# Patient Record
Sex: Female | Born: 1950
Health system: Southern US, Community
[De-identification: ages and names within clinical notes are randomized; demographics above are authoritative.]

## PROBLEM LIST (undated history)

## (undated) DIAGNOSIS — M21941 Unspecified acquired deformity of hand, right hand: Secondary | ICD-10-CM

## (undated) DIAGNOSIS — F1010 Alcohol abuse, uncomplicated: Secondary | ICD-10-CM

## (undated) DIAGNOSIS — C189 Malignant neoplasm of colon, unspecified: Secondary | ICD-10-CM

## (undated) DIAGNOSIS — Z95828 Presence of other vascular implants and grafts: Secondary | ICD-10-CM

## (undated) DIAGNOSIS — M21942 Unspecified acquired deformity of hand, left hand: Secondary | ICD-10-CM

## (undated) MED FILL — Dexamethasone Sodium Phosphate Inj 100 MG/10ML: INTRAMUSCULAR | Qty: 1 | Status: AC

---

## 2014-12-30 DIAGNOSIS — C189 Malignant neoplasm of colon, unspecified: Secondary | ICD-10-CM

## 2014-12-30 HISTORY — DX: Malignant neoplasm of colon, unspecified: C18.9

## 2014-12-30 HISTORY — PX: ABDOMINAL SURGERY: SHX537

## 2017-04-22 ENCOUNTER — Encounter (HOSPITAL_COMMUNITY): Payer: Self-pay | Admitting: Emergency Medicine

## 2017-04-22 ENCOUNTER — Emergency Department (HOSPITAL_COMMUNITY)
Admission: EM | Admit: 2017-04-22 | Discharge: 2017-04-23 | Disposition: A | Discharge status: Home / Self Care | Payer: Self-pay | Attending: Emergency Medicine | Admitting: Emergency Medicine

## 2017-04-22 ENCOUNTER — Emergency Department (HOSPITAL_COMMUNITY): Payer: Self-pay

## 2017-04-22 DIAGNOSIS — R222 Localized swelling, mass and lump, trunk: Secondary | ICD-10-CM | POA: Insufficient documentation

## 2017-04-22 DIAGNOSIS — R1900 Intra-abdominal and pelvic swelling, mass and lump, unspecified site: Secondary | ICD-10-CM

## 2017-04-22 DIAGNOSIS — R109 Unspecified abdominal pain: Secondary | ICD-10-CM | POA: Insufficient documentation

## 2017-04-22 HISTORY — DX: Alcohol abuse, uncomplicated: F10.10

## 2017-04-22 LAB — COMPREHENSIVE METABOLIC PANEL
ALBUMIN: 4.3 g/dL (ref 3.5–5.0)
ALT: 36 U/L (ref 14–54)
AST: 38 U/L (ref 15–41)
Alkaline Phosphatase: 75 U/L (ref 38–126)
Anion gap: 11 (ref 5–15)
BUN: 9 mg/dL (ref 6–20)
CALCIUM: 9 mg/dL (ref 8.9–10.3)
CO2: 22 mmol/L (ref 22–32)
Chloride: 104 mmol/L (ref 101–111)
Creatinine, Ser: 0.53 mg/dL (ref 0.44–1.00)
GFR calc non Af Amer: >60 mL/min (ref >60)
GLUCOSE: 88 mg/dL (ref 65–99)
POTASSIUM: 4.1 mmol/L (ref 3.5–5.1)
SODIUM: 137 mmol/L (ref 135–145)
Total Bilirubin: 0.4 mg/dL (ref 0.3–1.2)
Total Protein: 7.7 g/dL (ref 6.5–8.1)

## 2017-04-22 LAB — URINALYSIS, ROUTINE W REFLEX MICROSCOPIC
Bilirubin Urine: NEGATIVE
Glucose, UA: NEGATIVE mg/dL
HGB URINE DIPSTICK: NEGATIVE
Ketones, ur: NEGATIVE mg/dL
Leukocytes, UA: NEGATIVE
Nitrite: NEGATIVE
Protein, ur: NEGATIVE mg/dL
pH: 5 (ref 5.0–8.0)

## 2017-04-22 LAB — CBC
HCT: 43.7 % (ref 36.0–46.0)
HEMOGLOBIN: 15.1 g/dL — AB (ref 12.0–15.0)
MCH: 32 pg (ref 26.0–34.0)
MCHC: 34.6 g/dL (ref 30.0–36.0)
MCV: 92.6 fL (ref 78.0–100.0)
Platelets: 265 10*3/uL (ref 150–400)
RBC: 4.72 MIL/uL (ref 3.87–5.11)
RDW: 13.5 % (ref 11.5–15.5)
WBC: 9.2 10*3/uL (ref 4.0–10.5)

## 2017-04-22 LAB — LIPASE, BLOOD: LIPASE: 45 U/L (ref 11–51)

## 2017-04-22 MED ORDER — HYDROCODONE-ACETAMINOPHEN 5-325 MG PO TABS: 1.0000 | ORAL_TABLET | Freq: Four times a day (QID) | ORAL | 0 refills | Status: DC | PRN

## 2017-04-22 MED ORDER — IOPAMIDOL (ISOVUE-300) INJECTION 61%
100.0000 mL | Freq: Once | INTRAVENOUS | Status: AC | PRN
  Administered 2017-04-22: 100 mL via INTRAVENOUS

## 2017-04-22 NOTE — ED Notes (Signed)
Denies N/V/D/Fever.  Able to pass gas and have normal bowel movements. Complaining of pain on mass and burning sensations in lower abdomen.

## 2017-04-22 NOTE — ED Triage Notes (Signed)
Pt reports she had a abd mass that was cancerous removed in 2016. Pt sates 6 months ago she started having some pain and swelling around the lower incision. Pt has a large protrusion in her lower abd that is hard to touch and warm. p states it has been there for 6 months. Pt reports drinking 5 shots of vodka every day.

## 2017-04-22 NOTE — Discharge Instructions (Signed)
Please follow-up with the cancer Center. Return if any nausea, vomiting, unable to have a bowel movement, any new concerning symptoms.

## 2017-04-22 NOTE — ED Provider Notes (Signed)
[facility] DEPT Provider Note   CSN: [id number] Arrival date & time: 04/22/17  1614     History   Chief Complaint Chief Complaint  Patient presents with  . Mass    HPI Yolanda Watson is a 66 y.o. female.  HPI Yolanda Watson is a 66 y.o. female with hx of abdominal mass and alcohol abuse, presents to ED with complaint of abdominal mass. Pt states she had intra abdominal mass 2 years ago, operated at [facility]. States she had mass resection, colostomy, and few months later colostomy take down. States she did not require radiation or chemotherapy. States that she was doing well up until one year ago when she started noticing fullness in the same abdominal area. She states this fullness has grown into a protruding mass. She reports in the last several weeks it has become more tender. She denies any trouble eating, denies any recent weight loss, she admits to no trouble having a bowel movement or urinating. She has been taking Advil for her pain which she states helps. She currently lives in this area and does not have a primary care doctor or insurance.  Medical records obtained from [facility]. Patient presented initially to the emergency department with colon perforation from what was diagnosed later malignant neoplasm of descending colon.   Past Medical History:  Diagnosis Date  . Alcohol abuse   . Cancer ([place])     There are no active problems to display for this patient.   Past Surgical History:  Procedure Laterality Date  . ABDOMINAL SURGERY      OB History    No data available       Home Medications    Prior to Admission medications   Not on File    Family History No family history on file.  Social History Social History  Substance Use Topics  . Smoking status: Not on file  . Smokeless tobacco: Not on file  . Alcohol use Not on file     Allergies   Penicillins   Review of Systems Review of Systems  Constitutional: Negative for chills and fever.    Respiratory: Negative for cough, chest tightness and shortness of breath.   Cardiovascular: Negative for chest pain, palpitations and leg swelling.  Gastrointestinal: Positive for abdominal pain. Negative for diarrhea, nausea and vomiting.  Genitourinary: Negative for dysuria, flank pain and pelvic pain.  Musculoskeletal: Negative for arthralgias, myalgias, neck pain and neck stiffness.  Skin: Negative for rash.  Neurological: Negative for dizziness, weakness and headaches.  All other systems reviewed and are negative.    Physical Exam Updated Vital Signs BP 134/73 (BP Location: Left Arm)   Pulse 97   Temp 98 F (36.7 C) (Oral)   Resp 16   SpO2 97%   Physical Exam  Constitutional: She appears well-developed and well-nourished. No distress.  HENT:  Head: Normocephalic.  Eyes: Conjunctivae are normal.  Neck: Neck supple.  Cardiovascular: Normal rate, regular rhythm and normal heart sounds.   Pulmonary/Chest: Effort normal and breath sounds normal. No respiratory distress. She has no wheezes. She has no rales.  Abdominal: Soft. Bowel sounds are normal. She exhibits no distension. There is tenderness. There is no rebound.  Large palpable mass over midline lower abdomen from umbilicus to the pubic bone. There is mild erythema of the skin surrounding the tip of the mass, otherwise normal coloration of the skin. Diffuse tenderness. Abdomen is soft otherwise with no guarding.  Musculoskeletal: She exhibits no edema.  Neurological:  She is alert.  Skin: Skin is warm and dry.  Psychiatric: She has a normal mood and affect. Her behavior is normal.  Nursing note and vitals reviewed.    ED Treatments / Results  Labs (all labs ordered are listed, but only abnormal results are displayed) Labs Reviewed  CBC - Abnormal; Notable for the following:       Result Value   Hemoglobin 15.1 (*)    All other components within normal limits  URINALYSIS, ROUTINE W REFLEX MICROSCOPIC - Abnormal;  Notable for the following:    Specific Gravity, Urine >1.046 (*)    All other components within normal limits  LIPASE, BLOOD  COMPREHENSIVE METABOLIC PANEL    EKG  EKG Interpretation None       Radiology Ct Abdomen Pelvis W Contrast  Result Date: 04/22/2017 CLINICAL DATA:  66 year old female with abdominal mass and pain for 6 months. EXAM: CT ABDOMEN AND PELVIS WITH CONTRAST TECHNIQUE: Multidetector CT imaging of the abdomen and pelvis was performed using the standard protocol following bolus administration of intravenous contrast. CONTRAST:  183mL ISOVUE-300 IOPAMIDOL (ISOVUE-300) INJECTION 61% COMPARISON:  None. FINDINGS: Lower chest: The visualized lung bases are clear. No intra-abdominal free air or free fluid. Hepatobiliary: Small scattered calcified hepatic granuloma. The liver is otherwise unremarkable. No intrahepatic biliary ductal dilatation. The gallbladder is unremarkable as well. Pancreas: Unremarkable. No pancreatic ductal dilatation or surrounding inflammatory changes. Spleen: Small scattered calcified splenic granuloma. Adrenals/Urinary Tract: The adrenal glands, kidneys, visualized ureters, appear unremarkable. Subcentimeter right renal upper pole hypodense lesion is too small to characterize but likely represents a cyst. The urinary bladder is predominantly collapsed. There is slight irregularity of the bladder wall which may be related to underdistention or chronic infection. The anterior dome of the bladder connects to the anterior pelvic wall mass via the the urachal remnant. Extension of the mass along the urachal remnant with invasion of the bladder wall is less likely but not entirely excluded. Stomach/Bowel: There is no evidence of bowel obstruction or active inflammation. The appendix is not visualized with certainty. No inflammatory changes identified in the right lower quadrant. There is focal abutment of a loop of distal small bowel to the posterior anterior pelvic wall  masses. Vascular/Lymphatic: There is moderate aortoiliac atherosclerotic disease. The origins of the celiac axis, SMA, IMA and the renal arteries are patent. No portal venous gas identified. Top-normal retroperitoneal lymph node in the cavoatrial region measure up to 9 mm in short axis. Top-normal lymph node along the gastrohepatic ligament measures up to 8 mm in short axis. Somewhat rounded bilateral iliac chain and pelvic sidewall lymph nodes measure up to 8 mm in diameter. Reproductive: The uterus is retroverted or retroflexed. There is a 3.0 x 3.7 cm fat containing lesion in the region of the left adnexa most consistent with a teratoma. Other: There is an 11 x 12 cm heterogeneous multilobulated mass with areas of lower attenuation in the anterior pelvic wall and involving the rectus abdominus musculature. A smaller mass noted in the right inguinal region measuring approximately 2 x 2 cm. This mass is most concerning for a neoplastic process and may represent a metastatic disease or a primary neoplasm such as a sarcoma or lymphoma. Other etiologies are not excluded. There is midline vertical anterior abdominal wall incisional scar. Musculoskeletal: Osteopenia with degenerative changes of the spine. Multilevel old-appearing compression deformity primarily involving L2 and L3. No acute fracture. IMPRESSION: 1. Large heterogeneous anterior pelvic wall mass most consistent with malignancy and  may represent metastatic disease versus primary neoplasm such as sarcoma or lymphoma. A smaller lesion is also seen in the right anterior inguinal area. 2. Somewhat rounded bilateral iliac chain lymph node measuring up to 8 mm. A top-normal retroperitoneal nodes measuring approximately 9 mm in short axis is seen in the cavoatrial region. 3. No evidence of metastatic disease within the liver on this CT. 4. Left adnexal dermoid. 5. Mild irregularity and thickening of the bladder wall may be related to underdistention or chronic  infection versus chronic bladder outlet obstruction. Infiltrative involvement of the bladder by the pelvic wall mass is less likely but not entirely excluded. 6. No evidence of bowel obstruction or active inflammation. There is focal abutment of a distal small bowel loops to the posterior aspect of the anterior pelvic mass. Electronically Signed   By: Anner Crete M.D.   On: 04/22/2017 21:53    Procedures Procedures (including critical care time)  Medications Ordered in ED Medications - No data to display   Initial Impression / Assessment and Plan / ED Course  I have reviewed the triage vital signs and the nursing notes.  Pertinent labs & imaging results that were available during my care of the patient were reviewed by me and considered in my medical decision making (see chart for details).     Pt in ED with new abdominal mass, states had mass removed from same area in 2016, had colostomy and take down, unsure what cancer or mass it was and has had no follow up. On exam, large mass over abdomen. No problems with eating or bowels, but mass is painful. Will get labs and CT abd/pelvis.    11:23 PM CT showing large mass as described above. I received records from [facility]. It appears the patient had malignant colon cancer and ileostomy was performed initially due to colon perforation which was caused by the mass. She had ileostomy closure on 06/12/15. Patient appears to be very clueless of her prior diagnosis and has not had any follow-up. I am worried that this is a recurrent colon cancer. At this time she does not qualify for admission and does not need a surgical consultation given no associated symptoms. I discussed with Mariann Laster with case management and she will get patient into cancer Center clinic for further follow-up and further management. I will prescribe patient pain medications to help her with her pain. Return precautions discussed. Patient was informed of diagnosis and further  management plan and all questions answered.  Vitals:   04/22/17 2045 04/22/17 2100 04/22/17 2230 04/22/17 2245  BP: 134/65 111/68 122/79 128/78  Pulse: 84 74 75 77  Resp:      Temp:      TempSrc:      SpO2: 99% 95% 96% 97%       Final Clinical Impressions(s) / ED Diagnoses   Final diagnoses:  Abdominal mass, unspecified abdominal location    New Prescriptions New Prescriptions   HYDROCODONE-ACETAMINOPHEN (NORCO) 5-325 MG TABLET    Take 1 tablet by mouth every 6 (six) hours as needed for moderate pain.     Jeannett Senior, PA-C 04/22/17 [place], MD 04/23/17 [phone]

## 2017-04-23 ENCOUNTER — Telehealth: Payer: Self-pay | Admitting: Surgery

## 2017-04-23 ENCOUNTER — Telehealth: Payer: Self-pay | Admitting: Oncology

## 2017-04-23 ENCOUNTER — Telehealth: Payer: Self-pay | Admitting: *Deleted

## 2017-04-23 NOTE — Telephone Encounter (Signed)
ED CM spoke with patient last night regarding EDP request for  referral to [facility]. CM contacted the [facility] [phone] concerning patient  establishing care.  CM contacted patient today and provided the information to contact the [facility] today, to schedule appointment f/u. Patient verbalized understanding teach back done. CM also provided information to establish primary care at the [facility].

## 2017-04-23 NOTE — Telephone Encounter (Signed)
Appt has been scheduled for the pt to see Dr. Benay Spice on 4/26 at 1130am. Pt aware to arrive 30 minutes early. Voiced understanding.

## 2017-04-23 NOTE — ED Notes (Signed)
Pt departed in NAD.  

## 2017-04-23 NOTE — Telephone Encounter (Signed)
"  I am trying to reach Yolanda Watson about a place to go for my cancer care center.  Please call me at [phone].  I do not feel good, have pain and need hopefully I'm calling the right number."  Returned call.  "I was seen in the ED yesterday.  I have a mass in my abdomen and need an appointment."    Message left with new patient coordinator.

## 2017-04-24 ENCOUNTER — Telehealth: Payer: Self-pay

## 2017-04-24 ENCOUNTER — Encounter: Payer: Self-pay | Admitting: Oncology

## 2017-04-24 ENCOUNTER — Ambulatory Visit (HOSPITAL_BASED_OUTPATIENT_CLINIC_OR_DEPARTMENT_OTHER): Payer: Self-pay | Admitting: Oncology

## 2017-04-24 ENCOUNTER — Telehealth: Payer: Self-pay | Admitting: Oncology

## 2017-04-24 VITALS — BP 155/76 | HR 96 | Temp 98.2°F | Resp 18 | Ht 66.0 in | Wt 120.2 lb

## 2017-04-24 DIAGNOSIS — Z720 Tobacco use: Secondary | ICD-10-CM

## 2017-04-24 DIAGNOSIS — C189 Malignant neoplasm of colon, unspecified: Secondary | ICD-10-CM

## 2017-04-24 DIAGNOSIS — Z85038 Personal history of other malignant neoplasm of large intestine: Secondary | ICD-10-CM

## 2017-04-24 DIAGNOSIS — R935 Abnormal findings on diagnostic imaging of other abdominal regions, including retroperitoneum: Secondary | ICD-10-CM

## 2017-04-24 NOTE — Progress Notes (Signed)
Met with uninsured patient and spouse regarding coverage. Patient states her spouse has Medicare and she does not due to not being 43 yet. Asked patient if they have applied for Medicaid. She states they have not because they do not qualify. Advised patient she will automatically receive a 55% discount for being uninsured. Also advised that she may apply for an additional discount by applying for the [facility] FAA but she must have applied for Medicaid within this year. Gave patient a Medicaid application as well as a [place] FAA. Patient made aware that she can bring the applications/supporting documents back to me to submit via fax/mail at her next visit or submit them to the requested locations. Patient states she will bring them back to me at her next visit on 05/05/17. Advised patient that once her treatment plan has been established, there may be assistance for the drugs that she will be taking for treatment, which Rob will reach out to her at that time to discuss. Patient has my card for any additonal financial questions or concerns.

## 2017-04-24 NOTE — Progress Notes (Signed)
[facility] New Patient Consult   Referring MD: Fatima Blank, [address] [place], [place] [address]   Yolanda Watson 66 y.o.  03/15/1951    Reason for Referral: Abdominal mass   HPI: Ms. Knisley reports a palpable mass in the low abdomen for the past 4 months. There has been progressive associated pain. She takes Aleve twice daily for the pain. She presented to the [facility] emergency room on 04/22/2017. A CT of the abdomen and pelvis revealed a large heterogenous anterior pelvic wall mass with involvement of the rectus abdominous muscle. Smaller mass measuring 2 x 2 centimeters is noted in the right inguinal area. Top normal sized retroperitoneal and gastric hepatic ligament nodes were noted. There also bilateral iliac and pelvic sidewall nodes measuring up to 8 mm. A loop of distal small bowel abuts the posterior aspect of the pelvic mass, the mass extends to the dome of the bladder.  She reports being diagnosed with colon cancer in 2016. She underwent a colectomy and temporary colostomy at [facility] in [place]. She did not receive adjuvant therapy and has not undergone a follow-up colonoscopy. She has relocated to [place].  Past Medical History:  Diagnosis Date  . Alcohol abuse   . Cancer ([place])     .  G1 P1   .  Chronic flexion contractures of the fingers following remote trauma to the wrist area bilaterally  Past Surgical History:  Procedure Laterality Date  . ABDOMINAL SURGERY      .  Colectomy/colostomy   .  Colostomy reversal   .  Bilateral wrist surgery following trauma  Medications: Reviewed  Allergies:  Allergies  Allergen Reactions  . Penicillins     Family history: No family history of cancer  Social History:   She is retired and lives in [place]. She worked as an Glass blower/designer. She smokes cigarettes, currently 10 per day. She has 2 liquor drinks per day. No transfusion history. No risk factor for HIV or  hepatitis.     ROS:   Positives include: Low abdominal mass with associated pain, chronic flexion contractures of the fingers  A complete ROS was otherwise negative.  Physical Exam:  Blood pressure (!) 155/76, pulse 96, temperature 98.2 F (36.8 C), temperature source Oral, resp. rate 18, height 5\' 6"  (1.676 m), weight 120 lb 3.2 oz (54.5 kg), SpO2 100 %.  HEENT: Oropharynx without visible mass, neck without mass Lungs: Clear bilaterally, no respiratory distress Cardiac: Regular rate and rhythm Abdomen: No hepatosplenomegaly, no apparent ascites, large mass filling the low abdomen and suprapubic region. The mass is firm and fixed. No nodularity at the midline scar.  Vascular: No leg edema Lymph nodes: No cervical, supraclavicular, axillary, or left inguinal nodes. 2 cm mobile lateral right inguinal node Neurologic: Alert and oriented, the motor exam appears intact in the upper and lower extremities Skin: No rash Musculoskeletal: No spine tenderness   LAB:  CBC  Lab Results  Component Value Date   WBC 9.2 04/22/2017   HGB 15.1 (H) 04/22/2017   HCT 43.7 04/22/2017   MCV 92.6 04/22/2017   PLT 265 04/22/2017     CMP      Component Value Date/Time   NA 137 04/22/2017 1642   K 4.1 04/22/2017 1642   CL 104 04/22/2017 1642   CO2 22 04/22/2017 1642   GLUCOSE 88 04/22/2017 1642   BUN 9 04/22/2017 1642   CREATININE 0.53 04/22/2017 1642   CALCIUM 9.0 04/22/2017  1642   PROT 7.7 04/22/2017 1642   ALBUMIN 4.3 04/22/2017 1642   AST 38 04/22/2017 1642   ALT 36 04/22/2017 1642   ALKPHOS 75 04/22/2017 1642   BILITOT 0.4 04/22/2017 1642   GFRNONAA >60 04/22/2017 1642   GFRAA >60 04/22/2017 1642    No results found for: CEA  Imaging:  Ct Abdomen Pelvis W Contrast  Result Date: 04/22/2017 CLINICAL DATA:  66 year old female with abdominal mass and pain for 6 months. EXAM: CT ABDOMEN AND PELVIS WITH CONTRAST TECHNIQUE: Multidetector CT imaging of the abdomen and pelvis was  performed using the standard protocol following bolus administration of intravenous contrast. CONTRAST:  131mL ISOVUE-300 IOPAMIDOL (ISOVUE-300) INJECTION 61% COMPARISON:  None. FINDINGS: Lower chest: The visualized lung bases are clear. No intra-abdominal free air or free fluid. Hepatobiliary: Small scattered calcified hepatic granuloma. The liver is otherwise unremarkable. No intrahepatic biliary ductal dilatation. The gallbladder is unremarkable as well. Pancreas: Unremarkable. No pancreatic ductal dilatation or surrounding inflammatory changes. Spleen: Small scattered calcified splenic granuloma. Adrenals/Urinary Tract: The adrenal glands, kidneys, visualized ureters, appear unremarkable. Subcentimeter right renal upper pole hypodense lesion is too small to characterize but likely represents a cyst. The urinary bladder is predominantly collapsed. There is slight irregularity of the bladder wall which may be related to underdistention or chronic infection. The anterior dome of the bladder connects to the anterior pelvic wall mass via the the urachal remnant. Extension of the mass along the urachal remnant with invasion of the bladder wall is less likely but not entirely excluded. Stomach/Bowel: There is no evidence of bowel obstruction or active inflammation. The appendix is not visualized with certainty. No inflammatory changes identified in the right lower quadrant. There is focal abutment of a loop of distal small bowel to the posterior anterior pelvic wall masses. Vascular/Lymphatic: There is moderate aortoiliac atherosclerotic disease. The origins of the celiac axis, SMA, IMA and the renal arteries are patent. No portal venous gas identified. Top-normal retroperitoneal lymph node in the cavoatrial region measure up to 9 mm in short axis. Top-normal lymph node along the gastrohepatic ligament measures up to 8 mm in short axis. Somewhat rounded bilateral iliac chain and pelvic sidewall lymph nodes measure up  to 8 mm in diameter. Reproductive: The uterus is retroverted or retroflexed. There is a 3.0 x 3.7 cm fat containing lesion in the region of the left adnexa most consistent with a teratoma. Other: There is an 11 x 12 cm heterogeneous multilobulated mass with areas of lower attenuation in the anterior pelvic wall and involving the rectus abdominus musculature. A smaller mass noted in the right inguinal region measuring approximately 2 x 2 cm. This mass is most concerning for a neoplastic process and may represent a metastatic disease or a primary neoplasm such as a sarcoma or lymphoma. Other etiologies are not excluded. There is midline vertical anterior abdominal wall incisional scar. Musculoskeletal: Osteopenia with degenerative changes of the spine. Multilevel old-appearing compression deformity primarily involving L2 and L3. No acute fracture. IMPRESSION: 1. Large heterogeneous anterior pelvic wall mass most consistent with malignancy and may represent metastatic disease versus primary neoplasm such as sarcoma or lymphoma. A smaller lesion is also seen in the right anterior inguinal area. 2. Somewhat rounded bilateral iliac chain lymph node measuring up to 8 mm. A top-normal retroperitoneal nodes measuring approximately 9 mm in short axis is seen in the cavoatrial region. 3. No evidence of metastatic disease within the liver on this CT. 4. Left adnexal dermoid. 5. Mild  irregularity and thickening of the bladder wall may be related to underdistention or chronic infection versus chronic bladder outlet obstruction. Infiltrative involvement of the bladder by the pelvic wall mass is less likely but not entirely excluded. 6. No evidence of bowel obstruction or active inflammation. There is focal abutment of a distal small bowel loops to the posterior aspect of the anterior pelvic mass. Electronically Signed   By: Anner Crete M.D.   On: 04/22/2017 21:53      Assessment/Plan:   1. Abdominal/pelvic mass   CT  04/22/2017 revealed a heterogenous anterior pelvic wall mass, right inguinal mass, top normal sized retroperitoneal and iliac nodes, abutment of the anterior dome of the bladder and a loop of small bowel  2. Report of "colon cancer "diagnosed in [place] in 2016  3.   Ongoing tobacco and alcohol use   Disposition:   Ms. Alegria presents with a large abdominal/pelvic wall mass. She reports a history of colon cancer diagnosed in 2016. The mass may be related to a local recurrence of colon cancer or another malignancy.  I discussed the differential diagnosis with Ms. Angelo and her husband. She will be referred for a biopsy of the mass. We will obtain records from [facility].  She will begin hydrocodone as needed for pain. She will contact us if this does not relieve the pain.  I will present her case at the GI tumor conference within the next 1-2 weeks.  She will return for an office visit and further discussion on 05/05/2017.  50 minutes were spent with the patient today. The majority of the time was used for counseling and coordination of care.      Betsy Coder, MD  04/24/2017, 12:38 PM

## 2017-04-24 NOTE — Telephone Encounter (Signed)
Faxed medical release form to [facility] at [phone].

## 2017-04-24 NOTE — Telephone Encounter (Signed)
Gave patient AVS and calender per 4/26 los. [facility] to contact patient with US biopsy schedule.

## 2017-04-25 ENCOUNTER — Telehealth: Payer: Self-pay

## 2017-04-25 NOTE — Telephone Encounter (Signed)
Message received from Laurena Slimmer, RN CM requesting a hospital follow up appointment @ [facility] for the patient.  Call placed to the patient and an an appointment was scheduled for 05/06/17 @ 0945. The patient was very appreciative of the appointment. She said that she is feeling much better since seeing Dr Benay Spice yesterday. She also noted that she already has a biopsy scheduled. Explained to the patient that [facility] also has a Development worker, community available if she needs assistance with her financial assistance application.   Update provided to Wendi Maya, RN CM

## 2017-04-28 ENCOUNTER — Other Ambulatory Visit: Payer: Self-pay | Admitting: Radiology

## 2017-04-29 ENCOUNTER — Other Ambulatory Visit: Payer: Self-pay | Admitting: Nurse Practitioner

## 2017-04-29 ENCOUNTER — Ambulatory Visit (HOSPITAL_COMMUNITY)
Admission: RE | Admit: 2017-04-29 | Discharge: 2017-04-29 | Disposition: A | Discharge status: Home / Self Care | Payer: Self-pay | Source: Ambulatory Visit | Attending: Oncology | Admitting: Oncology

## 2017-04-29 ENCOUNTER — Encounter (HOSPITAL_COMMUNITY): Payer: Self-pay

## 2017-04-29 DIAGNOSIS — Z933 Colostomy status: Secondary | ICD-10-CM | POA: Insufficient documentation

## 2017-04-29 DIAGNOSIS — C494 Malignant neoplasm of connective and soft tissue of abdomen: Secondary | ICD-10-CM | POA: Insufficient documentation

## 2017-04-29 DIAGNOSIS — C189 Malignant neoplasm of colon, unspecified: Secondary | ICD-10-CM

## 2017-04-29 DIAGNOSIS — Z85038 Personal history of other malignant neoplasm of large intestine: Secondary | ICD-10-CM | POA: Insufficient documentation

## 2017-04-29 DIAGNOSIS — Z9889 Other specified postprocedural states: Secondary | ICD-10-CM | POA: Insufficient documentation

## 2017-04-29 DIAGNOSIS — Z880 Allergy status to penicillin: Secondary | ICD-10-CM | POA: Insufficient documentation

## 2017-04-29 DIAGNOSIS — R222 Localized swelling, mass and lump, trunk: Secondary | ICD-10-CM | POA: Insufficient documentation

## 2017-04-29 LAB — PROTIME-INR
INR: 0.9
Prothrombin Time: 12.2 seconds (ref 11.4–15.2)

## 2017-04-29 LAB — APTT: aPTT: 30 seconds (ref 24–36)

## 2017-04-29 MED ORDER — FENTANYL CITRATE (PF) 100 MCG/2ML IJ SOLN
INTRAMUSCULAR | Status: AC | PRN
  Administered 2017-04-29 (×2): 25 ug via INTRAVENOUS

## 2017-04-29 MED ORDER — MIDAZOLAM HCL 2 MG/2ML IJ SOLN
INTRAMUSCULAR | Status: AC | PRN
  Administered 2017-04-29: 0.5 mg via INTRAVENOUS
  Administered 2017-04-29 (×2): 1 mg via INTRAVENOUS

## 2017-04-29 MED ORDER — HYDROCODONE-ACETAMINOPHEN 5-325 MG PO TABS: 1.0000 | ORAL_TABLET | ORAL | Status: DC | PRN

## 2017-04-29 MED ORDER — OXYCODONE-ACETAMINOPHEN 5-325 MG PO TABS: 1.0000 | ORAL_TABLET | Freq: Four times a day (QID) | ORAL | 0 refills | Status: DC | PRN

## 2017-04-29 MED ORDER — SODIUM CHLORIDE 0.9 % IV SOLN
INTRAVENOUS | Status: DC
  Administered 2017-04-29: 12:00:00 via INTRAVENOUS

## 2017-04-29 MED ORDER — FENTANYL CITRATE (PF) 100 MCG/2ML IJ SOLN
INTRAMUSCULAR | Status: AC
  Filled 2017-04-29: qty 4

## 2017-04-29 MED ORDER — MIDAZOLAM HCL 2 MG/2ML IJ SOLN
INTRAMUSCULAR | Status: AC
  Filled 2017-04-29: qty 6

## 2017-04-29 NOTE — Procedures (Signed)
US guided core biopsies of the large anterior abdominal wall mass.  5 cores obtained.  No immediate complication.  Minimal blood loss.

## 2017-04-29 NOTE — Consult Note (Signed)
Chief Complaint: Patient was seen in consultation today for image guided abdominopelvic wall mass biopsy  Referring Physician(s): Sherrill,Gary B  Supervising Physician: Markus Daft  Patient Status: [facility] - Out-pt  History of Present Illness: Yolanda Watson is a 66 y.o. female with prior history of colon cancer in 2016, status post colectomy and colostomy with subsequent reversal in [place]. She now presents with several month history of abdominal pain and palpable mass. Follow-up CT scan has now revealed: 1. Large heterogeneous anterior pelvic wall mass most consistent with malignancy and may represent metastatic disease versus primary neoplasm such as sarcoma or lymphoma. A smaller lesion is also seen in the right anterior inguinal area. 2. Somewhat rounded bilateral iliac chain lymph node measuring up to 8 mm. A top-normal retroperitoneal nodes measuring approximately 9 mm in short axis is seen in the cavoatrial region. 3. No evidence of metastatic disease within the liver on this CT. 4. Left adnexal dermoid. 5. Mild irregularity and thickening of the bladder wall may be related to underdistention or chronic infection versus chronic bladder outlet obstruction. Infiltrative involvement of the bladder by the pelvic wall mass is less likely but not entirely excluded. 6. No evidence of bowel obstruction or active inflammation. There is focal abutment of a distal small bowel loops to the posterior aspect of the anterior pelvic mass.  Request now received for image guided biopsy of the anterior pelvic wall mass. She presents today for the procedure.  Past Medical History:  Diagnosis Date  . Alcohol abuse   . Cancer [facility])     Past Surgical History:  Procedure Laterality Date  . ABDOMINAL SURGERY      Allergies: Penicillins  Medications: Prior to Admission medications   Medication Sig Start Date End Date Taking? Authorizing Provider  HYDROcodone-acetaminophen (NORCO) 5-325  MG tablet Take 1 tablet by mouth every 6 (six) hours as needed for moderate pain. 04/22/17   Tatyana Kirichenko, PA-C  naproxen sodium (ANAPROX) 220 MG tablet Take 220 mg by mouth 2 (two) times daily with a meal.    Historical Provider, MD     No family history on file.  Social History   Social History  . Marital status: Married    Spouse name: N/A  . Number of children: N/A  . Years of education: N/A   Social History Main Topics  . Smoking status: Not on file  . Smokeless tobacco: Not on file  . Alcohol use Not on file  . Drug use: Unknown  . Sexual activity: Not on file   Other Topics Concern  . Not on file   Social History Narrative  . No narrative on file      Review of Systems currently denies fever, headache, chest pain, dyspnea, cough, back pain, nausea, vomiting or abnormal bleeding. She does have mid abdominal/pelvic discomfort secondary to large mass  Vital Signs: Blood pressure 148/80, temperature 36.6C, heart rate 85, respirations 16, O2 sat 98% room air    Physical Exam  awake, alert. Chest is clear to auscultation bilaterally. Heart with regular rate and rhythm. Abdomen soft, positive bowel sounds, large, firm  palpable tender midline abdominal/ pelvic mass with some mild superficial erythema/ warmth ; no lower extremity edema Mallampati Score:     Imaging: Ct Abdomen Pelvis W Contrast  Result Date: 04/22/2017 CLINICAL DATA:  66 year old female with abdominal mass and pain for 6 months. EXAM: CT ABDOMEN AND PELVIS WITH CONTRAST TECHNIQUE: Multidetector CT imaging of the abdomen and pelvis was performed  using the standard protocol following bolus administration of intravenous contrast. CONTRAST:  192mL ISOVUE-300 IOPAMIDOL (ISOVUE-300) INJECTION 61% COMPARISON:  None. FINDINGS: Lower chest: The visualized lung bases are clear. No intra-abdominal free air or free fluid. Hepatobiliary: Small scattered calcified hepatic granuloma. The liver is otherwise  unremarkable. No intrahepatic biliary ductal dilatation. The gallbladder is unremarkable as well. Pancreas: Unremarkable. No pancreatic ductal dilatation or surrounding inflammatory changes. Spleen: Small scattered calcified splenic granuloma. Adrenals/Urinary Tract: The adrenal glands, kidneys, visualized ureters, appear unremarkable. Subcentimeter right renal upper pole hypodense lesion is too small to characterize but likely represents a cyst. The urinary bladder is predominantly collapsed. There is slight irregularity of the bladder wall which may be related to underdistention or chronic infection. The anterior dome of the bladder connects to the anterior pelvic wall mass via the the urachal remnant. Extension of the mass along the urachal remnant with invasion of the bladder wall is less likely but not entirely excluded. Stomach/Bowel: There is no evidence of bowel obstruction or active inflammation. The appendix is not visualized with certainty. No inflammatory changes identified in the right lower quadrant. There is focal abutment of a loop of distal small bowel to the posterior anterior pelvic wall masses. Vascular/Lymphatic: There is moderate aortoiliac atherosclerotic disease. The origins of the celiac axis, SMA, IMA and the renal arteries are patent. No portal venous gas identified. Top-normal retroperitoneal lymph node in the cavoatrial region measure up to 9 mm in short axis. Top-normal lymph node along the gastrohepatic ligament measures up to 8 mm in short axis. Somewhat rounded bilateral iliac chain and pelvic sidewall lymph nodes measure up to 8 mm in diameter. Reproductive: The uterus is retroverted or retroflexed. There is a 3.0 x 3.7 cm fat containing lesion in the region of the left adnexa most consistent with a teratoma. Other: There is an 11 x 12 cm heterogeneous multilobulated mass with areas of lower attenuation in the anterior pelvic wall and involving the rectus abdominus musculature. A  smaller mass noted in the right inguinal region measuring approximately 2 x 2 cm. This mass is most concerning for a neoplastic process and may represent a metastatic disease or a primary neoplasm such as a sarcoma or lymphoma. Other etiologies are not excluded. There is midline vertical anterior abdominal wall incisional scar. Musculoskeletal: Osteopenia with degenerative changes of the spine. Multilevel old-appearing compression deformity primarily involving L2 and L3. No acute fracture. IMPRESSION: 1. Large heterogeneous anterior pelvic wall mass most consistent with malignancy and may represent metastatic disease versus primary neoplasm such as sarcoma or lymphoma. A smaller lesion is also seen in the right anterior inguinal area. 2. Somewhat rounded bilateral iliac chain lymph node measuring up to 8 mm. A top-normal retroperitoneal nodes measuring approximately 9 mm in short axis is seen in the cavoatrial region. 3. No evidence of metastatic disease within the liver on this CT. 4. Left adnexal dermoid. 5. Mild irregularity and thickening of the bladder wall may be related to underdistention or chronic infection versus chronic bladder outlet obstruction. Infiltrative involvement of the bladder by the pelvic wall mass is less likely but not entirely excluded. 6. No evidence of bowel obstruction or active inflammation. There is focal abutment of a distal small bowel loops to the posterior aspect of the anterior pelvic mass. Electronically Signed   By: Anner Crete M.D.   On: 04/22/2017 21:53    Labs:  CBC:  Recent Labs  04/22/17 1642  WBC 9.2  HGB 15.1*  HCT 43.7  PLT 265    COAGS: No results for input(s): INR, APTT in the last 8760 hours.  BMP:  Recent Labs  04/22/17 1642  NA 137  K 4.1  CL 104  CO2 22  GLUCOSE 88  BUN 9  CALCIUM 9.0  CREATININE 0.53  GFRNONAA >60  GFRAA >60    LIVER FUNCTION TESTS:  Recent Labs  04/22/17 1642  BILITOT 0.4  AST 38  ALT 36  ALKPHOS  75  PROT 7.7  ALBUMIN 4.3    TUMOR MARKERS: No results for input(s): AFPTM, CEA, CA199, CHROMGRNA in the last 8760 hours.  Assessment and Plan: 66 y.o. female with prior history of colon cancer in 2016, status post colectomy and colostomy with subsequent reversal in [place]. She now presents with several month history of abdominal pain and palpable mass. Follow-up CT scan has now revealed: 1. Large heterogeneous anterior pelvic wall mass most consistent with malignancy and may represent metastatic disease versus primary neoplasm such as sarcoma or lymphoma. A smaller lesion is also seen in the right anterior inguinal area. 2. Somewhat rounded bilateral iliac chain lymph node measuring up to 8 mm. A top-normal retroperitoneal nodes measuring approximately 9 mm in short axis is seen in the cavoatrial region. 3. No evidence of metastatic disease within the liver on this CT. 4. Left adnexal dermoid. 5. Mild irregularity and thickening of the bladder wall may be related to underdistention or chronic infection versus chronic bladder outlet obstruction. Infiltrative involvement of the bladder by the pelvic wall mass is less likely but not entirely excluded. 6. No evidence of bowel obstruction or active inflammation. There is focal abutment of a distal small bowel loops to the posterior aspect of the anterior pelvic mass.  She now presents for image guided anterior pelvic wall mass biopsy for further evaluation.Risks and benefits discussed with the patient/spouse including, but not limited to bleeding, infection, damage to adjacent structures or low yield requiring additional tests.All of the patient's questions were answered, patient is agreeable to proceed.Consent signed and in chart.     Thank you for this interesting consult.  I greatly enjoyed meeting Yolanda Watson and look forward to participating in their care.  A copy of this report was sent to the requesting provider on this  date.  Electronically Signed: D. Rowe Robert 04/29/2017, 11:23 AM   I spent a total of 20 minutes  in face to face in clinical consultation, greater than 50% of which was counseling/coordinating care for image guided anterior pelvic wall mass biopsy

## 2017-04-29 NOTE — Discharge Instructions (Signed)
Needle Biopsy, Care After °These instructions give you information about caring for yourself after your procedure. Your doctor may also give you more specific instructions. Call your doctor if you have any problems or questions after your procedure. °Follow these instructions at home: °· Rest as told by your doctor. °· Take medicines only as told by your doctor. °· There are many different ways to close and cover the biopsy site, including stitches (sutures), skin glue, and adhesive strips. Follow instructions from your doctor about: °¨ How to take care of your biopsy site. °¨ When and how you should change your bandage (dressing). °¨ When you should remove your dressing. °¨ Removing whatever was used to close your biopsy site. °· Check your biopsy site every day for signs of infection. Watch for: °¨ Redness, swelling, or pain. °¨ Fluid, blood, or pus. °Contact a doctor if: °· You have a fever. °· You have redness, swelling, or pain at the biopsy site, and it lasts longer than a few days. °· You have fluid, blood, or pus coming from the biopsy site. °· You feel sick to your stomach (nauseous). °· You throw up (vomit). °Get help right away if: °· You are short of breath. °· You have trouble breathing. °· Your chest hurts. °· You feel dizzy or you pass out (faint). °· You have bleeding that does not stop with pressure or a bandage. °· You cough up blood. °· Your belly (abdomen) hurts. °This information is not intended to replace advice given to you by your health care provider. Make sure you discuss any questions you have with your health care provider. °Document Released: 11/28/2008 Document Revised: 05/23/2016 Document Reviewed: 12/12/2014 °Elsevier Interactive Patient Education © 2017 [employer] Inc[employer] ° ° °Moderate Conscious Sedation, Adult, Care After °These instructions provide you with information about caring for yourself after your procedure. Your health care provider may also give you more specific instructions.  Your treatment has been planned according to current medical practices, but problems sometimes occur. Call your health care provider if you have any problems or questions after your procedure. °What can I expect after the procedure? °After your procedure, it is common: °· To feel sleepy for several hours. °· To feel clumsy and have poor balance for several hours. °· To have poor judgment for several hours. °· To vomit if you eat too soon. °Follow these instructions at home: °For at least 24 hours after the procedure:  ° °· Do not: °¨ Participate in activities where you could fall or become injured. °¨ Drive. °¨ Use heavy machinery. °¨ Drink alcohol. °¨ Take sleeping pills or medicines that cause drowsiness. °¨ Make important decisions or sign legal documents. °¨ Take care of children on your own. °· Rest. °Eating and drinking  °· Follow the diet recommended by your health care provider. °· If you vomit: °¨ Drink water, juice, or soup when you can drink without vomiting. °¨ Make sure you have little or no nausea before eating solid foods. °General instructions  °· Have a responsible adult stay with you until you are awake and alert. °· Take over-the-counter and prescription medicines only as told by your health care provider. °· If you smoke, do not smoke without supervision. °· Keep all follow-up visits as told by your health care provider. This is important. °Contact a health care provider if: °· You keep feeling nauseous or you keep vomiting. °· You feel light-headed. °· You develop a rash. °· You have a fever. °Get help right   away if: °· You have trouble breathing. °This information is not intended to replace advice given to you by your health care provider. Make sure you discuss any questions you have with your health care provider. °Document Released: 10/06/2013 Document Revised: 05/20/2016 Document Reviewed: 04/06/2016 °Elsevier Interactive Patient Education © 2017 Elsevier Inc. ° °

## 2017-05-05 ENCOUNTER — Encounter: Payer: Self-pay | Admitting: Oncology

## 2017-05-05 ENCOUNTER — Other Ambulatory Visit (HOSPITAL_BASED_OUTPATIENT_CLINIC_OR_DEPARTMENT_OTHER): Payer: Medicaid Other

## 2017-05-05 ENCOUNTER — Ambulatory Visit (HOSPITAL_BASED_OUTPATIENT_CLINIC_OR_DEPARTMENT_OTHER): Payer: Medicaid Other | Admitting: Nurse Practitioner

## 2017-05-05 ENCOUNTER — Telehealth: Payer: Self-pay | Admitting: Oncology

## 2017-05-05 DIAGNOSIS — C182 Malignant neoplasm of ascending colon: Secondary | ICD-10-CM

## 2017-05-05 DIAGNOSIS — C189 Malignant neoplasm of colon, unspecified: Secondary | ICD-10-CM | POA: Diagnosis present

## 2017-05-05 DIAGNOSIS — C187 Malignant neoplasm of sigmoid colon: Secondary | ICD-10-CM

## 2017-05-05 LAB — CEA (IN HOUSE-CHCC): CEA (CHCC-IN HOUSE): 4.21 ng/mL (ref 0.00–5.00)

## 2017-05-05 MED ORDER — OXYCODONE-ACETAMINOPHEN 5-325 MG PO TABS: 1.0000 | ORAL_TABLET | Freq: Four times a day (QID) | ORAL | 0 refills | Status: DC | PRN

## 2017-05-05 MED ORDER — LIDOCAINE-PRILOCAINE 2.5-2.5 % EX CREA: TOPICAL_CREAM | CUTANEOUS | 2 refills | Status: DC

## 2017-05-05 MED ORDER — PROCHLORPERAZINE MALEATE 10 MG PO TABS: 10.0000 mg | ORAL_TABLET | Freq: Four times a day (QID) | ORAL | 1 refills | Status: DC | PRN

## 2017-05-05 NOTE — Telephone Encounter (Signed)
Chemo class scheduled for 05/08/17, per 05/05/17 los. Patient was given a copy of the AVS report and appointment schedule.   Per Stacey/Lisa/Dr Benay Spice, a high priority message sent due to patient need to start on specific days. Per Ubaldo Glassing, 05/18 is 1st available date. Per Dr Benay Spice, 05/18 is too late for patient to start.

## 2017-05-05 NOTE — Progress Notes (Signed)
Patient came in to bring documents. Patient gave Medicaid application. Faxed to [place]. They will contact patient directly if anything else is needed as well as with a decision. They have 90 days to make a decision. Patient was missing documentation for [facility] FAA. She states she will bring the completed application once she receives that documentation. Patient has my card for any additional financial questions or concerns.

## 2017-05-05 NOTE — Progress Notes (Addendum)
Yolanda Watson OFFICE PROGRESS NOTE   Diagnosis: Abdominal mass   INTERVAL HISTORY:   Yolanda Watson returns as scheduled. She continues to have pain associated with the low abdomen mass. She is taking Percocet about every 6 hours. She denies nausea/vomiting. No constipation or diarrhea.  Objective:  Vital signs in last 24 hours:  Blood pressure (!) 150/79, pulse 84, temperature 98.4 F (36.9 C), temperature source Oral, resp. rate 18, height _0  (1.676 m), weight 122 lb (55.3 kg), SpO2 99 %.   Resp: Lungs clear bilaterally. Cardio: Regular rate and rhythm. GI: Large firm, fixed mass throughout the lower abdomen extending to the suprapubic region. Vascular: No leg edema. Neuro: Alert and oriented.    Lab Results:  Lab Results  Component Value Date   WBC 9.2 04/22/2017   HGB 15.1 (H) 04/22/2017   HCT 43.7 04/22/2017   MCV 92.6 04/22/2017   PLT 265 04/22/2017    Imaging:  No results found.  Medications: I have reviewed the patient's current medications.  Assessment/Plan: 1. Abdominal/pelvic mass  ? CT 04/22/2017 revealed a heterogenous anterior pelvic wall mass, right inguinal mass, top normal sized retroperitoneal and iliac nodes, abutment of the anterior dome of the bladder and a loop of small bowel ? 04/29/2017 biopsy of anterior abdominal/pelvic wall mass-acellular mucin dissecting fibrous stroma  2. Report of "colon cancer " diagnosed in [place] in 2016-pathology report dated 02/15/2015-invasive adenocarcinoma, well-differentiated with prominent mucinous features; microscopic tumor extension into mesenteric adipose tissue and focally involving visceral peritoneum; radial and mucosal margins negative; lymph-vascular invasion with suspicious foci identified; perineural invasion not identified; 16 negative lymph nodes; tumor deposits not identified; pT4apN0; MSI stable  3.   Ongoing tobacco and alcohol use   Disposition: Yolanda Watson appears stable. We  received the pathology report from the February 2016 colon surgery and reviewed this with Yolanda Watson and Yolanda Watson at today's visit. They understand the pathology report indicates a locally advanced colon cancer. We reviewed the pathology report from the recent biopsy of the abdominal wall mass. They understand that while cancer cells were not identified it is felt that the abdominal wall mass and other CT findings represent recurrent colon cancer.  Dr. Benay Spice recommends chemotherapy on the FOLFOX regimen. We discussed potential toxicities associated with chemotherapy including myelosuppression, nausea, hair loss, allergic reaction. We reviewed the neurotoxicity associated with oxaliplatin including cold sensitivity, peripheral neuropathy, acute laryngopharyngeal dysesthesia and more rare occurrences such as diplopia, ataxia, incontinence. We discussed potential toxicities associated with 5-fluorouracil including mouth sores, diarrhea, hand-foot syndrome, increased sensitivity to sun, conjunctivitis. She is agreeable to proceed. She will attend a chemotherapy education class.   She understands this regimen requires a Port-A-Cath. We are referring Yolanda to interventional radiology.  Prescriptions were sent to [facility] pharmacy for Compazine and EMLA cream.  She was provided with a new Percocet prescription at today's visit.  She will return for cycle 1 FOLFOX on 05/13/2017. We will see Yolanda in follow-up prior to cycle 2 on 05/28/2017. She will contact the office in the interim with any problems. We will request foundation 1 testing on the original biopsy from February 2016.   Patient seen with Dr. Benay Spice. 30 minutes were spent face-to-face at today's visit with the majority of that time involved in counseling/coordination of care.   Ned Card ANP/GNP-BC   05/05/2017  2:58 PM  This was a shared visit with Ned Card. The biopsy revealed acellular mucin and fibrosis. The original colon pathology  had  prominent mucinous features. I think it is very likely the pelvic mass is related to a local recurrence of colon cancer. Dr. Barry Dienes reviewed the CT and recommends chemotherapy prior to considering resection of the dominant mass.  We reviewed the potential toxicities associated with the FOLFOX regimen with Yolanda Watson. She agrees to proceed. We will request the tissue block from the 2016 colon resection to submit Foundation 1 testing.  Cycle 1 FOLFOX will be scheduled for 05/13/2017.  She agrees to placement of a Port-A-Cath for administration of chemotherapy.  Julieanne Manson, M.D.

## 2017-05-05 NOTE — Progress Notes (Signed)
START ON PATHWAY REGIMEN - Colorectal     A cycle is every 14 days:     Oxaliplatin      Leucovorin      5-Fluorouracil      5-Fluorouracil      Bevacizumab   **Always confirm dose/schedule in your pharmacy ordering system**    Patient Characteristics: Metastatic Colorectal, First Line, Potentially Resectable, KRAS Mutation Positive/Unknown, BRAF Wild-Type/Unknown Current evidence of distant metastases? Yes AJCC T Category: Staged < 8th Ed. AJCC N Category: Staged < 8th Ed. AJCC M Category: Staged < 8th Ed. AJCC 8 Stage Grouping: Staged < 8th Ed. BRAF Mutation Status: Awaiting Test Results KRAS/NRAS Mutation Status: Awaiting Test Results Line of therapy: First Line Would you be surprised if this patient died  in the next year? I would be surprised if this patient died in the next year  Intent of Therapy: Curative Intent, Discussed with Patient

## 2017-05-06 ENCOUNTER — Encounter: Payer: Self-pay | Admitting: *Deleted

## 2017-05-06 ENCOUNTER — Telehealth: Payer: Self-pay | Admitting: Oncology

## 2017-05-06 ENCOUNTER — Inpatient Hospital Stay: Payer: Self-pay | Admitting: Family Medicine

## 2017-05-06 NOTE — Telephone Encounter (Signed)
Message sent to [facility] to be added on 05/13/17, per 05/06/17 los. Other appointments pending initial start date.

## 2017-05-07 ENCOUNTER — Telehealth: Payer: Self-pay | Admitting: *Deleted

## 2017-05-07 ENCOUNTER — Telehealth: Payer: Self-pay | Admitting: Oncology

## 2017-05-07 NOTE — Telephone Encounter (Signed)
Telephone call received from patient and patient husband. Very confused by appointments and procedures scheduled. We discussed port insertion procedure and chemo education class scheduled for tomorrow. Reiterated port placement reason. Discussed chemo education class would go into further details on treatment plan and a tour of the center. Pt became tearful, provided encouragement and advised both that they are welcome to call this nurse with any questions. Confirmed scheduling for next week- discussed chemo pump and advised detailed instructions would be given to them when they come in for the first treatment.

## 2017-05-07 NOTE — Telephone Encounter (Signed)
Appointments scheduled per 05/05/17 los.  Infusion manger approved initial start date of 05/15/17.  Lab, flush, follow up and Chemo scheduled for 05/17 and 05/30 per 05/05/17 los.  Appointment dates confirmed with patient. Per patient, she will pick up a copy of the appointment schedule on 05/08/17 after Chemo education class.

## 2017-05-08 ENCOUNTER — Encounter: Payer: Self-pay | Admitting: *Deleted

## 2017-05-08 ENCOUNTER — Other Ambulatory Visit: Payer: Self-pay | Admitting: Radiology

## 2017-05-08 ENCOUNTER — Other Ambulatory Visit: Payer: Self-pay

## 2017-05-09 ENCOUNTER — Encounter (HOSPITAL_COMMUNITY): Payer: Self-pay | Admitting: General Practice

## 2017-05-09 ENCOUNTER — Telehealth: Payer: Self-pay

## 2017-05-09 ENCOUNTER — Other Ambulatory Visit: Payer: Self-pay | Admitting: Nurse Practitioner

## 2017-05-09 ENCOUNTER — Ambulatory Visit (HOSPITAL_COMMUNITY)
Admission: RE | Admit: 2017-05-09 | Discharge: 2017-05-09 | Disposition: A | Discharge status: Home / Self Care | Payer: Self-pay | Source: Ambulatory Visit | Attending: Nurse Practitioner | Admitting: Nurse Practitioner

## 2017-05-09 ENCOUNTER — Ambulatory Visit (HOSPITAL_COMMUNITY)
Admission: RE | Admit: 2017-05-09 | Discharge: 2017-05-09 | Disposition: A | Discharge status: Home / Self Care | Payer: Self-pay | Source: Ambulatory Visit | Attending: Oncology | Admitting: Oncology

## 2017-05-09 DIAGNOSIS — C189 Malignant neoplasm of colon, unspecified: Secondary | ICD-10-CM | POA: Insufficient documentation

## 2017-05-09 DIAGNOSIS — Z880 Allergy status to penicillin: Secondary | ICD-10-CM | POA: Insufficient documentation

## 2017-05-09 DIAGNOSIS — F17210 Nicotine dependence, cigarettes, uncomplicated: Secondary | ICD-10-CM | POA: Insufficient documentation

## 2017-05-09 HISTORY — PX: IR FLUORO GUIDE PORT INSERTION RIGHT: IMG5741

## 2017-05-09 HISTORY — PX: IR US GUIDE VASC ACCESS RIGHT: IMG2390

## 2017-05-09 LAB — CBC WITH DIFFERENTIAL/PLATELET
Basophils Absolute: 0 10*3/uL (ref 0.0–0.1)
Basophils Relative: 0 %
EOS ABS: 0.5 10*3/uL (ref 0.0–0.7)
EOS PCT: 5 %
HCT: 42.2 % (ref 36.0–46.0)
Hemoglobin: 14.2 g/dL (ref 12.0–15.0)
LYMPHS ABS: 1.8 10*3/uL (ref 0.7–4.0)
Lymphocytes Relative: 19 %
MCH: 31.9 pg (ref 26.0–34.0)
MCHC: 33.6 g/dL (ref 30.0–36.0)
MCV: 94.8 fL (ref 78.0–100.0)
Monocytes Absolute: 0.4 10*3/uL (ref 0.1–1.0)
Monocytes Relative: 4 %
Neutro Abs: 6.5 10*3/uL (ref 1.7–7.7)
Neutrophils Relative %: 72 %
PLATELETS: 300 10*3/uL (ref 150–400)
RBC: 4.45 MIL/uL (ref 3.87–5.11)
RDW: 14.1 % (ref 11.5–15.5)
WBC: 9.2 10*3/uL (ref 4.0–10.5)

## 2017-05-09 MED ORDER — MIDAZOLAM HCL 2 MG/2ML IJ SOLN
INTRAMUSCULAR | Status: AC | PRN
  Administered 2017-05-09 (×2): 1 mg via INTRAVENOUS

## 2017-05-09 MED ORDER — FENTANYL CITRATE (PF) 100 MCG/2ML IJ SOLN
INTRAMUSCULAR | Status: AC
  Filled 2017-05-09: qty 4

## 2017-05-09 MED ORDER — VANCOMYCIN HCL IN DEXTROSE 1-5 GM/200ML-% IV SOLN
1000.0000 mg | INTRAVENOUS | Status: AC
  Administered 2017-05-09: 1000 mg via INTRAVENOUS

## 2017-05-09 MED ORDER — LIDOCAINE-EPINEPHRINE (PF) 2 %-1:200000 IJ SOLN
INTRAMUSCULAR | Status: AC | PRN
  Administered 2017-05-09: 10 mL via INTRADERMAL

## 2017-05-09 MED ORDER — SODIUM CHLORIDE 0.9 % IV SOLN
INTRAVENOUS | Status: DC
  Administered 2017-05-09: 08:00:00 via INTRAVENOUS

## 2017-05-09 MED ORDER — HEPARIN SOD (PORK) LOCK FLUSH 100 UNIT/ML IV SOLN
INTRAVENOUS | Status: AC
  Filled 2017-05-09: qty 5

## 2017-05-09 MED ORDER — LIDOCAINE-EPINEPHRINE (PF) 2 %-1:200000 IJ SOLN
INTRAMUSCULAR | Status: AC
  Filled 2017-05-09: qty 20

## 2017-05-09 MED ORDER — MIDAZOLAM HCL 2 MG/2ML IJ SOLN
INTRAMUSCULAR | Status: AC
  Filled 2017-05-09: qty 4

## 2017-05-09 MED ORDER — FENTANYL CITRATE (PF) 100 MCG/2ML IJ SOLN
INTRAMUSCULAR | Status: AC | PRN
  Administered 2017-05-09 (×2): 50 ug via INTRAVENOUS

## 2017-05-09 MED ORDER — VANCOMYCIN HCL IN DEXTROSE 1-5 GM/200ML-% IV SOLN
INTRAVENOUS | Status: AC
  Filled 2017-05-09: qty 200

## 2017-05-09 MED ORDER — OXYCODONE-ACETAMINOPHEN 5-325 MG PO TABS: 1.0000 | ORAL_TABLET | Freq: Four times a day (QID) | ORAL | 0 refills | Status: DC | PRN

## 2017-05-09 NOTE — Addendum Note (Signed)
Addended by: Jethro Bolus A on: 05/09/2017 09:03 AM   Modules accepted: Orders

## 2017-05-09 NOTE — Discharge Instructions (Signed)

## 2017-05-09 NOTE — H&P (Signed)
Chief Complaint: recurrent colon cancer  Referring Physician:Dr. Dominica Severin B. Sherrill  Supervising Physician: Daryll Brod  Patient Status: [facility] - Out-pt  HPI: Yolanda Watson is a 66 y.o. female who was diagnosed with colon cancer in 2016 and had surgery in [place].  She noticed at the end of last year this mass on her abdominal wall.  She has been seen by Dr. Benay Spice and the patient had this mass biopsied on 5/1.  However, her pathology was nondiagnostic essentially and Dr. Benay Spice felt as if this was most likely a recurrence of her colon cancer.  They have asked for a PAC to be placed to start chemotherapy.  Past Medical History:  Past Medical History:  Diagnosis Date  . Alcohol abuse   . Cancer [facility]     Past Surgical History:  Past Surgical History:  Procedure Laterality Date  . ABDOMINAL SURGERY      Family History: History reviewed. No pertinent family history.  Social History:  reports that she has been smoking Cigarettes.  She has never used smokeless tobacco. Her alcohol and drug histories are not on file.  Allergies:  Allergies  Allergen Reactions  . Penicillins Other (See Comments)    Patient stated,"I don't know what kind of reaction I had because I was a kid when it happened."    Medications: Medications reviewed in epic  Please HPI for pertinent positives, otherwise complete 10 system ROS negative, except abdominal pain.  Mallampati Score: MD Evaluation Airway: WNL Heart: WNL Abdomen: Other (comments) Abdomen comments: large abdominal wall mass Chest/ Lungs: WNL ASA  Classification: 2 Mallampati/Airway Score: One  Physical Exam: BP (!) 142/61 (BP Location: Left Arm)   Pulse 80   Temp 98.1 F (36.7 C) (Oral)   Resp 16   Ht 5\' 6"  (1.676 m)   Wt 122 lb (55.3 kg)   SpO2 98%   BMI 19.69 kg/m  Body mass index is 19.69 kg/m. General: pleasant, skinnt white female who is laying in bed in NAD HEENT: head is normocephalic, atraumatic.  Sclera are  noninjected.  PERRL.  Ears and nose without any masses or lesions.  Mouth is pink and moist Heart: regular, rate, and rhythm.  Normal s1,s2. No obvious murmurs, gallops, or rubs noted.  Palpable radial and pedal pulses bilaterally Lungs: CTAB, no wheezes, rhonchi, or rales noted.  Respiratory effort nonlabored Abd: soft, NT except over mass, ND, +BS, large lower abdominal wall mass that is hard and tender to touch. Psych: A&Ox3 with an appropriate affect.   Labs: Results for orders placed or performed during the hospital encounter of 05/09/17 (from the past 48 hour(s))  CBC with Differential/Platelet     Status: None   Collection Time: 05/09/17  7:35 AM  Result Value Ref Range   WBC 9.2 4.0 - 10.5 K/uL   RBC 4.45 3.87 - 5.11 MIL/uL   Hemoglobin 14.2 12.0 - 15.0 g/dL   HCT 42.2 36.0 - 46.0 %   MCV 94.8 78.0 - 100.0 fL   MCH 31.9 26.0 - 34.0 pg   MCHC 33.6 30.0 - 36.0 g/dL   RDW 14.1 11.5 - 15.5 %   Platelets 300 150 - 400 K/uL   Neutrophils Relative % 72 %   Neutro Abs 6.5 1.7 - 7.7 K/uL   Lymphocytes Relative 19 %   Lymphs Abs 1.8 0.7 - 4.0 K/uL   Monocytes Relative 4 %   Monocytes Absolute 0.4 0.1 - 1.0 K/uL   Eosinophils Relative 5 %  Eosinophils Absolute 0.5 0.0 - 0.7 K/uL   Basophils Relative 0 %   Basophils Absolute 0.0 0.0 - 0.1 K/uL    Imaging: No results found.  Assessment/Plan 1. Recurrent colon cancer We will plan to place a RIJ port a cath today so the patient can start chemotherapy. Labs and vitals have been reviewed. Risks and Benefits discussed with the patient including, but not limited to bleeding, infection, pneumothorax, or fibrin sheath development and need for additional procedures. All of the patient's questions were answered, patient is agreeable to proceed. Consent signed and in chart.   Thank you for this interesting consult.  I greatly enjoyed meeting Yolanda Watson and look forward to participating in their care.  A copy of this report was sent to  the requesting provider on this date.  Electronically Signed: Henreitta Cea 05/09/2017, 9:00 AM   I spent a total of    25 Minutes in face to face in clinical consultation, greater than 50% of which was counseling/coordinating care for recurrent colon cancer

## 2017-05-09 NOTE — Procedures (Signed)
Access for chemotherapy  Status post right IJ single-lumen port  Tip SVC RA  EBL 0  No immediate complication  Full  report in PACs

## 2017-05-09 NOTE — Telephone Encounter (Signed)
Husband called for oxycodone refill. Pt has been taking 2 tablets about every 6-8 hours. She will run out on Monday. Pt is at [facility] to get her port placed at 0930 and would like to pick it up whet the rx is ready.

## 2017-05-09 NOTE — Telephone Encounter (Signed)
Refill authorized per Ned Card, NP and increase quantity to #[phone].  Pt husband notified and prescription placed in pick up binder.

## 2017-05-11 ENCOUNTER — Other Ambulatory Visit: Payer: Self-pay | Admitting: Oncology

## 2017-05-15 ENCOUNTER — Encounter: Payer: Self-pay | Admitting: Oncology

## 2017-05-15 ENCOUNTER — Other Ambulatory Visit: Payer: Self-pay

## 2017-05-15 ENCOUNTER — Ambulatory Visit (HOSPITAL_BASED_OUTPATIENT_CLINIC_OR_DEPARTMENT_OTHER): Payer: Medicaid Other

## 2017-05-15 VITALS — BP 131/77 | HR 85 | Temp 98.5°F | Resp 17

## 2017-05-15 DIAGNOSIS — Z5111 Encounter for antineoplastic chemotherapy: Secondary | ICD-10-CM

## 2017-05-15 DIAGNOSIS — C189 Malignant neoplasm of colon, unspecified: Secondary | ICD-10-CM | POA: Diagnosis not present

## 2017-05-15 DIAGNOSIS — C182 Malignant neoplasm of ascending colon: Secondary | ICD-10-CM

## 2017-05-15 MED ORDER — PALONOSETRON HCL INJECTION 0.25 MG/5ML
INTRAVENOUS | Status: AC
  Filled 2017-05-15: qty 5

## 2017-05-15 MED ORDER — DEXAMETHASONE SODIUM PHOSPHATE 10 MG/ML IJ SOLN
INTRAMUSCULAR | Status: AC
  Filled 2017-05-15: qty 1

## 2017-05-15 MED ORDER — PALONOSETRON HCL INJECTION 0.25 MG/5ML
0.2500 mg | Freq: Once | INTRAVENOUS | Status: AC
  Administered 2017-05-15: 0.25 mg via INTRAVENOUS

## 2017-05-15 MED ORDER — SODIUM CHLORIDE 0.9 % IV SOLN
2400.0000 mg/m2 | INTRAVENOUS | Status: DC
  Administered 2017-05-15: 3850 mg via INTRAVENOUS
  Filled 2017-05-15: qty 77

## 2017-05-15 MED ORDER — DEXAMETHASONE SODIUM PHOSPHATE 10 MG/ML IJ SOLN
10.0000 mg | Freq: Once | INTRAMUSCULAR | Status: AC
  Administered 2017-05-15: 10 mg via INTRAVENOUS

## 2017-05-15 MED ORDER — LEUCOVORIN CALCIUM INJECTION 350 MG
400.0000 mg/m2 | Freq: Once | INTRAVENOUS | Status: AC
  Administered 2017-05-15: 640 mg via INTRAVENOUS
  Filled 2017-05-15: qty 32

## 2017-05-15 MED ORDER — OXALIPLATIN CHEMO INJECTION 100 MG/20ML
85.0000 mg/m2 | Freq: Once | INTRAVENOUS | Status: AC
  Administered 2017-05-15: 135 mg via INTRAVENOUS
  Filled 2017-05-15: qty 17

## 2017-05-15 MED ORDER — DEXTROSE 5 % IV SOLN
Freq: Once | INTRAVENOUS | Status: AC
  Administered 2017-05-15: 08:00:00 via INTRAVENOUS

## 2017-05-15 MED ORDER — FLUOROURACIL CHEMO INJECTION 2.5 GM/50ML
400.0000 mg/m2 | Freq: Once | INTRAVENOUS | Status: AC
  Administered 2017-05-15: 650 mg via INTRAVENOUS
  Filled 2017-05-15: qty 13

## 2017-05-15 NOTE — Patient Instructions (Signed)
[facility] Discharge Instructions for Patients Receiving Chemotherapy  Today you received the following chemotherapy agents: Oxaliplatin, Leucovorin, Adrucil (5FU)  To help prevent nausea and vomiting after your treatment, we encourage you to take your nausea medication as prescribed.   If you develop nausea and vomiting that is not controlled by your nausea medication, call the clinic.   BELOW ARE SYMPTOMS THAT SHOULD BE REPORTED IMMEDIATELY:  *FEVER GREATER THAN 100.5 F  *CHILLS WITH OR WITHOUT FEVER  NAUSEA AND VOMITING THAT IS NOT CONTROLLED WITH YOUR NAUSEA MEDICATION  *UNUSUAL SHORTNESS OF BREATH  *UNUSUAL BRUISING OR BLEEDING  TENDERNESS IN MOUTH AND THROAT WITH OR WITHOUT PRESENCE OF ULCERS  *URINARY PROBLEMS  *BOWEL PROBLEMS  UNUSUAL RASH Items with * indicate a potential emergency and should be followed up as soon as possible.  Feel free to call the clinic you have any questions or concerns. The clinic phone number is [phone].  Please show the [facility] at check-in to the Emergency Department and triage nurse.  Oxaliplatin Injection What is this medicine? OXALIPLATIN (ox AL i PLA tin) is a chemotherapy drug. It targets fast dividing cells, like cancer cells, and causes these cells to die. This medicine is used to treat cancers of the colon and rectum, and many other cancers. This medicine may be used for other purposes; ask your health care provider or pharmacist if you have questions. COMMON BRAND NAME(S): Eloxatin What should I tell my health care provider before I take this medicine? They need to know if you have any of these conditions: -kidney disease -an unusual or allergic reaction to oxaliplatin, other chemotherapy, other medicines, foods, dyes, or preservatives -pregnant or trying to get pregnant -breast-feeding How should I use this medicine? This drug is given as an infusion into a vein. It is administered in a hospital  or clinic by a specially trained health care professional. Talk to your pediatrician regarding the use of this medicine in children. Special care may be needed. Overdosage: If you think you have taken too much of this medicine contact a poison control center or emergency room at once. NOTE: This medicine is only for you. Do not share this medicine with others. What if I miss a dose? It is important not to miss a dose. Call your doctor or health care professional if you are unable to keep an appointment. What may interact with this medicine? -medicines to increase blood counts like filgrastim, pegfilgrastim, sargramostim -probenecid -some antibiotics like amikacin, gentamicin, neomycin, polymyxin B, streptomycin, tobramycin -zalcitabine Talk to your doctor or health care professional before taking any of these medicines: -acetaminophen -aspirin -ibuprofen -ketoprofen -naproxen This list may not describe all possible interactions. Give your health care provider a list of all the medicines, herbs, non-prescription drugs, or dietary supplements you use. Also tell them if you smoke, drink alcohol, or use illegal drugs. Some items may interact with your medicine. What should I watch for while using this medicine? Your condition will be monitored carefully while you are receiving this medicine. You will need important blood work done while you are taking this medicine. This medicine can make you more sensitive to cold. Do not drink cold drinks or use ice. Cover exposed skin before coming in contact with cold temperatures or cold objects. When out in cold weather wear warm clothing and cover your mouth and nose to warm the air that goes into your lungs. Tell your doctor if you get sensitive to the cold. This  drug may make you feel generally unwell. This is not uncommon, as chemotherapy can affect healthy cells as well as cancer cells. Report any side effects. Continue your course of treatment even  though you feel ill unless your doctor tells you to stop. In some cases, you may be given additional medicines to help with side effects. Follow all directions for their use. Call your doctor or health care professional for advice if you get a fever, chills or sore throat, or other symptoms of a cold or flu. Do not treat yourself. This drug decreases your body's ability to fight infections. Try to avoid being around people who are sick. This medicine may increase your risk to bruise or bleed. Call your doctor or health care professional if you notice any unusual bleeding. Be careful brushing and flossing your teeth or using a toothpick because you may get an infection or bleed more easily. If you have any dental work done, tell your dentist you are receiving this medicine. Avoid taking products that contain aspirin, acetaminophen, ibuprofen, naproxen, or ketoprofen unless instructed by your doctor. These medicines may hide a fever. Do not become pregnant while taking this medicine. Women should inform their doctor if they wish to become pregnant or think they might be pregnant. There is a potential for serious side effects to an unborn child. Talk to your health care professional or pharmacist for more information. Do not breast-feed an infant while taking this medicine. Call your doctor or health care professional if you get diarrhea. Do not treat yourself. What side effects may I notice from receiving this medicine? Side effects that you should report to your doctor or health care professional as soon as possible: -allergic reactions like skin rash, itching or hives, swelling of the face, lips, or tongue -low blood counts - This drug may decrease the number of white blood cells, red blood cells and platelets. You may be at increased risk for infections and bleeding. -signs of infection - fever or chills, cough, sore throat, pain or difficulty passing urine -signs of decreased platelets or bleeding -  bruising, pinpoint red spots on the skin, black, tarry stools, nosebleeds -signs of decreased red blood cells - unusually weak or tired, fainting spells, lightheadedness -breathing problems -chest pain, pressure -cough -diarrhea -jaw tightness -mouth sores -nausea and vomiting -pain, swelling, redness or irritation at the injection site -pain, tingling, numbness in the hands or feet -problems with balance, talking, walking -redness, blistering, peeling or loosening of the skin, including inside the mouth -trouble passing urine or change in the amount of urine Side effects that usually do not require medical attention (report to your doctor or health care professional if they continue or are bothersome): -changes in vision -constipation -hair loss -loss of appetite -metallic taste in the mouth or changes in taste -stomach pain This list may not describe all possible side effects. Call your doctor for medical advice about side effects. You may report side effects to FDA at 1-800-FDA-1088. Where should I keep my medicine? This drug is given in a hospital or clinic and will not be stored at home. NOTE: This sheet is a summary. It may not cover all possible information. If you have questions about this medicine, talk to your doctor, pharmacist, or health care provider.  2018 Elsevier/Gold Standard (2008-07-12 17:22:47) Leucovorin injection What is this medicine? LEUCOVORIN (loo koe VOR in) is used to prevent or treat the harmful effects of some medicines. This medicine is used to treat anemia caused  by a low amount of folic acid in the body. It is also used with 5-fluorouracil (5-FU) to treat colon cancer. This medicine may be used for other purposes; ask your health care provider or pharmacist if you have questions. What should I tell my health care provider before I take this medicine? They need to know if you have any of these conditions: -anemia from low levels of vitamin B-12 in the  blood -an unusual or allergic reaction to leucovorin, folic acid, other medicines, foods, dyes, or preservatives -pregnant or trying to get pregnant -breast-feeding How should I use this medicine? This medicine is for injection into a muscle or into a vein. It is given by a health care professional in a hospital or clinic setting. Talk to your pediatrician regarding the use of this medicine in children. Special care may be needed. Overdosage: If you think you have taken too much of this medicine contact a poison control center or emergency room at once. NOTE: This medicine is only for you. Do not share this medicine with others. What if I miss a dose? This does not apply. What may interact with this medicine? -capecitabine -fluorouracil -phenobarbital -phenytoin -primidone -trimethoprim-sulfamethoxazole This list may not describe all possible interactions. Give your health care provider a list of all the medicines, herbs, non-prescription drugs, or dietary supplements you use. Also tell them if you smoke, drink alcohol, or use illegal drugs. Some items may interact with your medicine. What should I watch for while using this medicine? Your condition will be monitored carefully while you are receiving this medicine. This medicine may increase the side effects of 5-fluorouracil, 5-FU. Tell your doctor or health care professional if you have diarrhea or mouth sores that do not get better or that get worse. What side effects may I notice from receiving this medicine? Side effects that you should report to your doctor or health care professional as soon as possible: -allergic reactions like skin rash, itching or hives, swelling of the face, lips, or tongue -breathing problems -fever, infection -mouth sores -unusual bleeding or bruising -unusually weak or tired Side effects that usually do not require medical attention (report to your doctor or health care professional if they continue or are  bothersome): -constipation or diarrhea -loss of appetite -nausea, vomiting This list may not describe all possible side effects. Call your doctor for medical advice about side effects. You may report side effects to FDA at 1-800-FDA-1088. Where should I keep my medicine? This drug is given in a hospital or clinic and will not be stored at home. NOTE: This sheet is a summary. It may not cover all possible information. If you have questions about this medicine, talk to your doctor, pharmacist, or health care provider.  2018 Elsevier/Gold Standard (2008-06-21 16:50:29) Fluorouracil, 5-FU injection What is this medicine? FLUOROURACIL, 5-FU (flure oh YOOR a sil) is a chemotherapy drug. It slows the growth of cancer cells. This medicine is used to treat many types of cancer like breast cancer, colon or rectal cancer, pancreatic cancer, and stomach cancer. This medicine may be used for other purposes; ask your health care provider or pharmacist if you have questions. COMMON BRAND NAME(S): Adrucil What should I tell my health care provider before I take this medicine? They need to know if you have any of these conditions: -blood disorders -dihydropyrimidine dehydrogenase (DPD) deficiency -infection (especially a virus infection such as chickenpox, cold sores, or herpes) -kidney disease -liver disease -malnourished, poor nutrition -recent or ongoing radiation therapy -  an unusual or allergic reaction to fluorouracil, other chemotherapy, other medicines, foods, dyes, or preservatives -pregnant or trying to get pregnant -breast-feeding How should I use this medicine? This drug is given as an infusion or injection into a vein. It is administered in a hospital or clinic by a specially trained health care professional. Talk to your pediatrician regarding the use of this medicine in children. Special care may be needed. Overdosage: If you think you have taken too much of this medicine contact a poison  control center or emergency room at once. NOTE: This medicine is only for you. Do not share this medicine with others. What if I miss a dose? It is important not to miss your dose. Call your doctor or health care professional if you are unable to keep an appointment. What may interact with this medicine? -allopurinol -cimetidine -dapsone -digoxin -hydroxyurea -leucovorin -levamisole -medicines for seizures like ethotoin, fosphenytoin, phenytoin -medicines to increase blood counts like filgrastim, pegfilgrastim, sargramostim -medicines that treat or prevent blood clots like warfarin, enoxaparin, and dalteparin -methotrexate -metronidazole -pyrimethamine -some other chemotherapy drugs like busulfan, cisplatin, estramustine, vinblastine -trimethoprim -trimetrexate -vaccines Talk to your doctor or health care professional before taking any of these medicines: -acetaminophen -aspirin -ibuprofen -ketoprofen -naproxen This list may not describe all possible interactions. Give your health care provider a list of all the medicines, herbs, non-prescription drugs, or dietary supplements you use. Also tell them if you smoke, drink alcohol, or use illegal drugs. Some items may interact with your medicine. What should I watch for while using this medicine? Visit your doctor for checks on your progress. This drug may make you feel generally unwell. This is not uncommon, as chemotherapy can affect healthy cells as well as cancer cells. Report any side effects. Continue your course of treatment even though you feel ill unless your doctor tells you to stop. In some cases, you may be given additional medicines to help with side effects. Follow all directions for their use. Call your doctor or health care professional for advice if you get a fever, chills or sore throat, or other symptoms of a cold or flu. Do not treat yourself. This drug decreases your body's ability to fight infections. Try to avoid  being around people who are sick. This medicine may increase your risk to bruise or bleed. Call your doctor or health care professional if you notice any unusual bleeding. Be careful brushing and flossing your teeth or using a toothpick because you may get an infection or bleed more easily. If you have any dental work done, tell your dentist you are receiving this medicine. Avoid taking products that contain aspirin, acetaminophen, ibuprofen, naproxen, or ketoprofen unless instructed by your doctor. These medicines may hide a fever. Do not become pregnant while taking this medicine. Women should inform their doctor if they wish to become pregnant or think they might be pregnant. There is a potential for serious side effects to an unborn child. Talk to your health care professional or pharmacist for more information. Do not breast-feed an infant while taking this medicine. Men should inform their doctor if they wish to father a child. This medicine may lower sperm counts. Do not treat diarrhea with over the counter products. Contact your doctor if you have diarrhea that lasts more than 2 days or if it is severe and watery. This medicine can make you more sensitive to the sun. Keep out of the sun. If you cannot avoid being in the sun, wear protective clothing  and use sunscreen. Do not use sun lamps or tanning beds/booths. What side effects may I notice from receiving this medicine? Side effects that you should report to your doctor or health care professional as soon as possible: -allergic reactions like skin rash, itching or hives, swelling of the face, lips, or tongue -low blood counts - this medicine may decrease the number of white blood cells, red blood cells and platelets. You may be at increased risk for infections and bleeding. -signs of infection - fever or chills, cough, sore throat, pain or difficulty passing urine -signs of decreased platelets or bleeding - bruising, pinpoint red spots on the  skin, black, tarry stools, blood in the urine -signs of decreased red blood cells - unusually weak or tired, fainting spells, lightheadedness -breathing problems -changes in vision -chest pain -mouth sores -nausea and vomiting -pain, swelling, redness at site where injected -pain, tingling, numbness in the hands or feet -redness, swelling, or sores on hands or feet -stomach pain -unusual bleeding Side effects that usually do not require medical attention (report to your doctor or health care professional if they continue or are bothersome): -changes in finger or toe nails -diarrhea -dry or itchy skin -hair loss -headache -loss of appetite -sensitivity of eyes to the light -stomach upset -unusually teary eyes This list may not describe all possible side effects. Call your doctor for medical advice about side effects. You may report side effects to FDA at 1-800-FDA-1088. Where should I keep my medicine? This drug is given in a hospital or clinic and will not be stored at home. NOTE: This sheet is a summary. It may not cover all possible information. If you have questions about this medicine, talk to your doctor, pharmacist, or health care provider.  2018 Elsevier/Gold Standard (2008-04-20 13:53:16)

## 2017-05-15 NOTE — Progress Notes (Signed)
Patient's spouse came back to pick up documents. Gave him everything that was faxed to [employer] including fax confirmation sheet.

## 2017-05-15 NOTE — Progress Notes (Signed)
Per MD ok to treat with CBC from 05/09/17 and no CMET.  Wylene Simmer, BSN, RN 05/15/2017 8:09 AM

## 2017-05-15 NOTE — Progress Notes (Signed)
Patient's spouse came in to bring [facility] application and supporting documents. Made copies and gave him original documentation back. Placed in interoffice mail envelope and in the box to be mailed to the Business office:[employer] Customer [employer]. Advised spouse they will receive a determination letter in the mail in about 3 weeks. He verbalized understanding.  He also brought a letter from [facility] requesting additional documentation. Made copies of the documents he brought in and printed itemized medical bills from here to submit as well. Advised him to take the forms requesting patient signature to his spouse to complete and bring back. He took them to her in treatment and brought back. Faxed documentation to [place] as requested on the forms. Fax received ok per confirmation sheet. Advised him they will contact them if anything else is needed and have up to 90 days to make a decision and send letter. He verbalized understanding.  They will stop by my office to pick up on the way out. They have my card for any additional financial questions or concerns.

## 2017-05-17 ENCOUNTER — Ambulatory Visit (HOSPITAL_BASED_OUTPATIENT_CLINIC_OR_DEPARTMENT_OTHER): Payer: Self-pay

## 2017-05-17 VITALS — BP 153/79 | HR 81 | Temp 98.8°F | Resp 16

## 2017-05-17 DIAGNOSIS — C182 Malignant neoplasm of ascending colon: Secondary | ICD-10-CM

## 2017-05-17 MED ORDER — SODIUM CHLORIDE 0.9% FLUSH
10.0000 mL | INTRAVENOUS | Status: DC | PRN
  Administered 2017-05-17: 10 mL
  Filled 2017-05-17: qty 10

## 2017-05-17 MED ORDER — HEPARIN SOD (PORK) LOCK FLUSH 100 UNIT/ML IV SOLN
500.0000 [IU] | Freq: Once | INTRAVENOUS | Status: AC | PRN
  Administered 2017-05-17: 500 [IU]
  Filled 2017-05-17: qty 5

## 2017-05-19 ENCOUNTER — Other Ambulatory Visit: Payer: Self-pay | Admitting: *Deleted

## 2017-05-19 DIAGNOSIS — C189 Malignant neoplasm of colon, unspecified: Secondary | ICD-10-CM

## 2017-05-19 MED ORDER — OXYCODONE-ACETAMINOPHEN 5-325 MG PO TABS: 1.0000 | ORAL_TABLET | Freq: Four times a day (QID) | ORAL | 0 refills | Status: DC | PRN

## 2017-05-19 NOTE — Telephone Encounter (Signed)
Message from pt requesting refill of Oxycodone.

## 2017-05-19 NOTE — Telephone Encounter (Signed)
Called pt that he pain rx is ready for pickup tomorrow.

## 2017-05-21 ENCOUNTER — Telehealth: Payer: Self-pay

## 2017-05-21 NOTE — Telephone Encounter (Signed)
Called pt for chemo f/u. She is doing well. No nausea. Monday she felt puny/ weak. No diarrhea, "is sipping water as we speak".

## 2017-05-21 NOTE — Telephone Encounter (Signed)
-----   Message from Sherril Croon, RN sent at 05/15/2017  2:45 PM EDT ----- Regarding: Chemo Follow Up [facility]  1st Time oxaliplatin, Leucovorin, Adrucil

## 2017-05-26 ENCOUNTER — Other Ambulatory Visit: Payer: Self-pay | Admitting: Oncology

## 2017-05-27 ENCOUNTER — Telehealth: Payer: Self-pay | Admitting: *Deleted

## 2017-05-27 NOTE — Telephone Encounter (Signed)
Received [facility] report. Placed on MD desk for review.

## 2017-05-28 ENCOUNTER — Ambulatory Visit (HOSPITAL_BASED_OUTPATIENT_CLINIC_OR_DEPARTMENT_OTHER): Payer: Medicaid Other | Admitting: Oncology

## 2017-05-28 ENCOUNTER — Ambulatory Visit (HOSPITAL_BASED_OUTPATIENT_CLINIC_OR_DEPARTMENT_OTHER): Payer: Medicaid Other

## 2017-05-28 ENCOUNTER — Telehealth: Payer: Self-pay | Admitting: Oncology

## 2017-05-28 ENCOUNTER — Other Ambulatory Visit (HOSPITAL_BASED_OUTPATIENT_CLINIC_OR_DEPARTMENT_OTHER): Payer: Medicaid Other

## 2017-05-28 DIAGNOSIS — C182 Malignant neoplasm of ascending colon: Secondary | ICD-10-CM

## 2017-05-28 DIAGNOSIS — C189 Malignant neoplasm of colon, unspecified: Secondary | ICD-10-CM

## 2017-05-28 DIAGNOSIS — Z5111 Encounter for antineoplastic chemotherapy: Secondary | ICD-10-CM | POA: Diagnosis not present

## 2017-05-28 DIAGNOSIS — Z95828 Presence of other vascular implants and grafts: Secondary | ICD-10-CM

## 2017-05-28 DIAGNOSIS — Z452 Encounter for adjustment and management of vascular access device: Secondary | ICD-10-CM

## 2017-05-28 LAB — CBC WITH DIFFERENTIAL/PLATELET
BASO%: 0.2 % (ref 0.0–2.0)
Basophils Absolute: 0 10*3/uL (ref 0.0–0.1)
EOS%: 4.6 % (ref 0.0–7.0)
Eosinophils Absolute: 0.2 10*3/uL (ref 0.0–0.5)
HCT: 38.8 % (ref 34.8–46.6)
HGB: 12.8 g/dL (ref 11.6–15.9)
LYMPH%: 32.2 % (ref 14.0–49.7)
MCH: 31.6 pg (ref 25.1–34.0)
MCHC: 33 g/dL (ref 31.5–36.0)
MCV: 95.8 fL (ref 79.5–101.0)
MONO#: 0.3 10*3/uL (ref 0.1–0.9)
MONO%: 6.4 % (ref 0.0–14.0)
NEUT%: 56.6 % (ref 38.4–76.8)
NEUTROS ABS: 2.8 10*3/uL (ref 1.5–6.5)
Platelets: 209 10*3/uL (ref 145–400)
RBC: 4.05 10*6/uL (ref 3.70–5.45)
RDW: 14.5 % (ref 11.2–14.5)
WBC: 5 10*3/uL (ref 3.9–10.3)
lymph#: 1.6 10*3/uL (ref 0.9–3.3)

## 2017-05-28 LAB — COMPREHENSIVE METABOLIC PANEL
ALT: 26 U/L (ref 0–55)
AST: 28 U/L (ref 5–34)
Albumin: 3.7 g/dL (ref 3.5–5.0)
Alkaline Phosphatase: 82 U/L (ref 40–150)
Anion Gap: 11 mEq/L (ref 3–11)
BILIRUBIN TOTAL: 0.37 mg/dL (ref 0.20–1.20)
BUN: 12 mg/dL (ref 7.0–26.0)
CHLORIDE: 103 meq/L (ref 98–109)
CO2: 24 meq/L (ref 22–29)
Calcium: 9.2 mg/dL (ref 8.4–10.4)
Creatinine: 0.7 mg/dL (ref 0.6–1.1)
GLUCOSE: 85 mg/dL (ref 70–140)
Potassium: 4.2 mEq/L (ref 3.5–5.1)
SODIUM: 138 meq/L (ref 136–145)
TOTAL PROTEIN: 6.9 g/dL (ref 6.4–8.3)

## 2017-05-28 MED ORDER — PALONOSETRON HCL INJECTION 0.25 MG/5ML
0.2500 mg | Freq: Once | INTRAVENOUS | Status: AC
  Administered 2017-05-28: 0.25 mg via INTRAVENOUS

## 2017-05-28 MED ORDER — FLUOROURACIL CHEMO INJECTION 2.5 GM/50ML
400.0000 mg/m2 | Freq: Once | INTRAVENOUS | Status: AC
  Administered 2017-05-28: 650 mg via INTRAVENOUS
  Filled 2017-05-28: qty 13

## 2017-05-28 MED ORDER — LEUCOVORIN CALCIUM INJECTION 350 MG
400.0000 mg/m2 | Freq: Once | INTRAVENOUS | Status: AC
  Administered 2017-05-28: 640 mg via INTRAVENOUS
  Filled 2017-05-28: qty 32

## 2017-05-28 MED ORDER — PALONOSETRON HCL INJECTION 0.25 MG/5ML
INTRAVENOUS | Status: AC
  Filled 2017-05-28: qty 5

## 2017-05-28 MED ORDER — SODIUM CHLORIDE 0.9 % IV SOLN
2400.0000 mg/m2 | INTRAVENOUS | Status: DC
  Administered 2017-05-28: 3850 mg via INTRAVENOUS
  Filled 2017-05-28: qty 77

## 2017-05-28 MED ORDER — OXYCODONE-ACETAMINOPHEN 5-325 MG PO TABS: 1.0000 | ORAL_TABLET | Freq: Four times a day (QID) | ORAL | 0 refills | Status: DC | PRN

## 2017-05-28 MED ORDER — DEXAMETHASONE SODIUM PHOSPHATE 10 MG/ML IJ SOLN
10.0000 mg | Freq: Once | INTRAMUSCULAR | Status: AC
  Administered 2017-05-28: 10 mg via INTRAVENOUS

## 2017-05-28 MED ORDER — OXALIPLATIN CHEMO INJECTION 100 MG/20ML
85.0000 mg/m2 | Freq: Once | INTRAVENOUS | Status: AC
  Administered 2017-05-28: 135 mg via INTRAVENOUS
  Filled 2017-05-28: qty 20

## 2017-05-28 MED ORDER — DEXAMETHASONE SODIUM PHOSPHATE 10 MG/ML IJ SOLN
INTRAMUSCULAR | Status: AC
  Filled 2017-05-28: qty 1

## 2017-05-28 MED ORDER — DEXTROSE 5 % IV SOLN
Freq: Once | INTRAVENOUS | Status: AC
  Administered 2017-05-28: 14:00:00 via INTRAVENOUS

## 2017-05-28 MED ORDER — SODIUM CHLORIDE 0.9% FLUSH
10.0000 mL | INTRAVENOUS | Status: DC | PRN
  Administered 2017-05-28: 10 mL via INTRAVENOUS
  Filled 2017-05-28: qty 10

## 2017-05-28 NOTE — Patient Instructions (Signed)
Oxaliplatin Injection  What is this medicine?  OXALIPLATIN (ox AL i PLA tin) is a chemotherapy drug. It targets fast dividing cells, like cancer cells, and causes these cells to die. This medicine is used to treat cancers of the colon and rectum, and many other cancers.  This medicine may be used for other purposes; ask your health care provider or pharmacist if you have questions.  COMMON BRAND NAME(S): Eloxatin  What should I tell my health care provider before I take this medicine?  They need to know if you have any of these conditions:  -kidney disease  -an unusual or allergic reaction to oxaliplatin, other chemotherapy, other medicines, foods, dyes, or preservatives  -pregnant or trying to get pregnant  -breast-feeding  How should I use this medicine?  This drug is given as an infusion into a vein. It is administered in a hospital or clinic by a specially trained health care professional.  Talk to your pediatrician regarding the use of this medicine in children. Special care may be needed.  Overdosage: If you think you have taken too much of this medicine contact a poison control center or emergency room at once.  NOTE: This medicine is only for you. Do not share this medicine with others.  What if I miss a dose?  It is important not to miss a dose. Call your doctor or health care professional if you are unable to keep an appointment.  What may interact with this medicine?  -medicines to increase blood counts like filgrastim, pegfilgrastim, sargramostim  -probenecid  -some antibiotics like amikacin, gentamicin, neomycin, polymyxin B, streptomycin, tobramycin  -zalcitabine  Talk to your doctor or health care professional before taking any of these medicines:  -acetaminophen  -aspirin  -ibuprofen  -ketoprofen  -naproxen  This list may not describe all possible interactions. Give your health care provider a list of all the medicines, herbs, non-prescription drugs, or dietary supplements you use. Also tell them if  you smoke, drink alcohol, or use illegal drugs. Some items may interact with your medicine.  What should I watch for while using this medicine?  Your condition will be monitored carefully while you are receiving this medicine. You will need important blood work done while you are taking this medicine.  This medicine can make you more sensitive to cold. Do not drink cold drinks or use ice. Cover exposed skin before coming in contact with cold temperatures or cold objects. When out in cold weather wear warm clothing and cover your mouth and nose to warm the air that goes into your lungs. Tell your doctor if you get sensitive to the cold.  This drug may make you feel generally unwell. This is not uncommon, as chemotherapy can affect healthy cells as well as cancer cells. Report any side effects. Continue your course of treatment even though you feel ill unless your doctor tells you to stop.  In some cases, you may be given additional medicines to help with side effects. Follow all directions for their use.  Call your doctor or health care professional for advice if you get a fever, chills or sore throat, or other symptoms of a cold or flu. Do not treat yourself. This drug decreases your body's ability to fight infections. Try to avoid being around people who are sick.  This medicine may increase your risk to bruise or bleed. Call your doctor or health care professional if you notice any unusual bleeding.  Be careful brushing and flossing your teeth or   using a toothpick because you may get an infection or bleed more easily. If you have any dental work done, tell your dentist you are receiving this medicine.  Avoid taking products that contain aspirin, acetaminophen, ibuprofen, naproxen, or ketoprofen unless instructed by your doctor. These medicines may hide a fever.  Do not become pregnant while taking this medicine. Women should inform their doctor if they wish to become pregnant or think they might be pregnant. There  is a potential for serious side effects to an unborn child. Talk to your health care professional or pharmacist for more information. Do not breast-feed an infant while taking this medicine.  Call your doctor or health care professional if you get diarrhea. Do not treat yourself.  What side effects may I notice from receiving this medicine?  Side effects that you should report to your doctor or health care professional as soon as possible:  -allergic reactions like skin rash, itching or hives, swelling of the face, lips, or tongue  -low blood counts - This drug may decrease the number of white blood cells, red blood cells and platelets. You may be at increased risk for infections and bleeding.  -signs of infection - fever or chills, cough, sore throat, pain or difficulty passing urine  -signs of decreased platelets or bleeding - bruising, pinpoint red spots on the skin, black, tarry stools, nosebleeds  -signs of decreased red blood cells - unusually weak or tired, fainting spells, lightheadedness  -breathing problems  -chest pain, pressure  -cough  -diarrhea  -jaw tightness  -mouth sores  -nausea and vomiting  -pain, swelling, redness or irritation at the injection site  -pain, tingling, numbness in the hands or feet  -problems with balance, talking, walking  -redness, blistering, peeling or loosening of the skin, including inside the mouth  -trouble passing urine or change in the amount of urine  Side effects that usually do not require medical attention (report to your doctor or health care professional if they continue or are bothersome):  -changes in vision  -constipation  -hair loss  -loss of appetite  -metallic taste in the mouth or changes in taste  -stomach pain  This list may not describe all possible side effects. Call your doctor for medical advice about side effects. You may report side effects to FDA at 1-800-FDA-1088.  Where should I keep my medicine?  This drug is given in a hospital or clinic and  will not be stored at home.  NOTE: This sheet is a summary. It may not cover all possible information. If you have questions about this medicine, talk to your doctor, pharmacist, or health care provider.   2018 Elsevier/Gold Standard (2008-07-12 17:22:47)

## 2017-05-28 NOTE — Patient Instructions (Signed)

## 2017-05-28 NOTE — Progress Notes (Signed)
  [place] OFFICE PROGRESS NOTE   Diagnosis: Colon cancer  INTERVAL HISTORY:   Yolanda Watson completed a first cycle of FOLFOX on 05/15/2017. She tolerated chemotherapy well. No nausea, cold sensitivity, neuropathy symptoms, mouth sores, or diarrhea. She believes the lobe nominal mass is smaller. Her pain is partially improved.  Objective:  Vital signs in last 24 hours:  Blood pressure 136/82, pulse 77, temperature 98 F (36.7 C), temperature source Oral, resp. rate 17, height '5\' 6"'$  (1.676 m), weight 116 lb 8 oz (52.8 kg), SpO2 99 %.    HEENT: No thrush or ulcers Lymphatics: Firm 1.5 center meter node in the right inguinal region Resp: Lungs clear bilaterally Cardio: Regular rate and rhythm GI: No hepatosplenomegaly, firm fixed low abdominal mass Vascular: No leg edema   Portacath/PICC-without erythema  Lab Results:  Lab Results  Component Value Date   WBC 5.0 05/28/2017   HGB 12.8 05/28/2017   HCT 38.8 05/28/2017   MCV 95.8 05/28/2017   PLT 209 05/28/2017   NEUTROABS 2.8 05/28/2017    CMP     Component Value Date/Time   NA 138 05/28/2017 1114   K 4.2 05/28/2017 1114   CL 104 04/22/2017 1642   CO2 24 05/28/2017 1114   GLUCOSE 85 05/28/2017 1114   BUN 12.0 05/28/2017 1114   CREATININE 0.7 05/28/2017 1114   CALCIUM 9.2 05/28/2017 1114   PROT 6.9 05/28/2017 1114   ALBUMIN 3.7 05/28/2017 1114   AST 28 05/28/2017 1114   ALT 26 05/28/2017 1114   ALKPHOS 82 05/28/2017 1114   BILITOT 0.37 05/28/2017 1114   GFRNONAA >60 04/22/2017 1642   GFRAA >60 04/22/2017 1642    Lab Results  Component Value Date   CEA1 4.21 05/05/2017    Medications: I have reviewed the patient's current medications.  Assessment/Plan: 1. Metastatic colon cancer presenting with an Abdominal/pelvic mass  2.      Colon cancer diagnosed in [place] in 2016-pathology report dated 02/15/2015-invasive adenocarcinoma, well-differentiated with prominent mucinous features;  microscopic tumor extension into mesenteric adipose tissue and focally involving visceral peritoneum; radial and mucosal margins negative; lymph-vascular invasion with suspicious foci identified;perineural invasion not identified; 16 negative lymph nodes; tumor deposits not identified; pT4apN0; MSI stable  ? CT 04/22/2017 revealed a heterogenous anterior pelvic wall mass, right inguinal mass, top normal sized retroperitoneal and iliac nodes, abutment of the anterior dome of the bladder and a loop of small bowel ? 04/29/2017 biopsy of anterior abdominal/pelvic wall mass-acellular mucin dissecting fibrous stroma ? Cycle 1 FOLFOX 05/15/2017 ? Cycle 2 FOLFOX 05/28/2017   2. Ongoing tobacco and alcohol use    Disposition:  Yolanda Watson tolerated the first cycle of FOLFOX well. The plan is to proceed with cycle 2 today. She will return for an office visit and chemotherapy in 2 weeks.  15 minutes were spent with the patient today. The majority of the time was used for counseling and coordination of care.  Yolanda Romberg, Yolanda Watson  05/28/2017  1:22 PM

## 2017-05-28 NOTE — Telephone Encounter (Signed)
Lab, flush, Chemo, Pump D/C and follow up with Dr Benay Spice was scheduled for 06/11/17 and 06/25/17, per 05/28/17 los.  Patient was given a copy of the AVS report and appointment schedule, per 05/28/17 los.

## 2017-05-30 ENCOUNTER — Ambulatory Visit (HOSPITAL_BASED_OUTPATIENT_CLINIC_OR_DEPARTMENT_OTHER): Payer: Medicaid Other

## 2017-05-30 VITALS — BP 121/67 | HR 77 | Temp 98.7°F | Resp 17

## 2017-05-30 DIAGNOSIS — C189 Malignant neoplasm of colon, unspecified: Secondary | ICD-10-CM | POA: Diagnosis not present

## 2017-05-30 DIAGNOSIS — C182 Malignant neoplasm of ascending colon: Secondary | ICD-10-CM

## 2017-05-30 DIAGNOSIS — Z452 Encounter for adjustment and management of vascular access device: Secondary | ICD-10-CM

## 2017-05-30 MED ORDER — SODIUM CHLORIDE 0.9% FLUSH
10.0000 mL | INTRAVENOUS | Status: DC | PRN
  Administered 2017-05-30: 10 mL
  Filled 2017-05-30: qty 10

## 2017-05-30 MED ORDER — HEPARIN SOD (PORK) LOCK FLUSH 100 UNIT/ML IV SOLN
500.0000 [IU] | Freq: Once | INTRAVENOUS | Status: AC | PRN
  Administered 2017-05-30: 500 [IU]
  Filled 2017-05-30: qty 5

## 2017-06-05 ENCOUNTER — Telehealth: Payer: Self-pay | Admitting: *Deleted

## 2017-06-05 DIAGNOSIS — C189 Malignant neoplasm of colon, unspecified: Secondary | ICD-10-CM

## 2017-06-05 MED ORDER — OXYCODONE-ACETAMINOPHEN 5-325 MG PO TABS: 1.0000 | ORAL_TABLET | Freq: Four times a day (QID) | ORAL | 0 refills | Status: DC | PRN

## 2017-06-05 NOTE — Telephone Encounter (Signed)
Telephone call from patient to request percocet refill. Pt advised rx ready for pick up at [facility]

## 2017-06-08 ENCOUNTER — Other Ambulatory Visit: Payer: Self-pay | Admitting: Oncology

## 2017-06-11 ENCOUNTER — Telehealth: Payer: Self-pay | Admitting: Oncology

## 2017-06-11 ENCOUNTER — Other Ambulatory Visit (HOSPITAL_BASED_OUTPATIENT_CLINIC_OR_DEPARTMENT_OTHER): Payer: Medicaid Other

## 2017-06-11 ENCOUNTER — Ambulatory Visit (HOSPITAL_BASED_OUTPATIENT_CLINIC_OR_DEPARTMENT_OTHER): Payer: Medicaid Other

## 2017-06-11 ENCOUNTER — Ambulatory Visit (HOSPITAL_BASED_OUTPATIENT_CLINIC_OR_DEPARTMENT_OTHER): Payer: Medicaid Other | Admitting: Oncology

## 2017-06-11 ENCOUNTER — Encounter: Payer: Self-pay | Admitting: Oncology

## 2017-06-11 VITALS — BP 163/78

## 2017-06-11 DIAGNOSIS — Z5111 Encounter for antineoplastic chemotherapy: Secondary | ICD-10-CM | POA: Diagnosis not present

## 2017-06-11 DIAGNOSIS — Z95828 Presence of other vascular implants and grafts: Secondary | ICD-10-CM

## 2017-06-11 DIAGNOSIS — C189 Malignant neoplasm of colon, unspecified: Secondary | ICD-10-CM

## 2017-06-11 DIAGNOSIS — Z5112 Encounter for antineoplastic immunotherapy: Secondary | ICD-10-CM

## 2017-06-11 DIAGNOSIS — Z452 Encounter for adjustment and management of vascular access device: Secondary | ICD-10-CM | POA: Diagnosis present

## 2017-06-11 DIAGNOSIS — C187 Malignant neoplasm of sigmoid colon: Secondary | ICD-10-CM

## 2017-06-11 DIAGNOSIS — C182 Malignant neoplasm of ascending colon: Secondary | ICD-10-CM

## 2017-06-11 LAB — CBC WITH DIFFERENTIAL/PLATELET
BASO%: 0.6 % (ref 0.0–2.0)
BASOS ABS: 0 10*3/uL (ref 0.0–0.1)
EOS%: 3.4 % (ref 0.0–7.0)
Eosinophils Absolute: 0.2 10*3/uL (ref 0.0–0.5)
HEMATOCRIT: 37 % (ref 34.8–46.6)
HGB: 12.2 g/dL (ref 11.6–15.9)
LYMPH#: 1.7 10*3/uL (ref 0.9–3.3)
LYMPH%: 37 % (ref 14.0–49.7)
MCH: 31.6 pg (ref 25.1–34.0)
MCHC: 33 g/dL (ref 31.5–36.0)
MCV: 95.9 fL (ref 79.5–101.0)
MONO#: 0.5 10*3/uL (ref 0.1–0.9)
MONO%: 10.4 % (ref 0.0–14.0)
NEUT#: 2.3 10*3/uL (ref 1.5–6.5)
NEUT%: 48.6 % (ref 38.4–76.8)
Platelets: 154 10*3/uL (ref 145–400)
RBC: 3.86 10*6/uL (ref 3.70–5.45)
RDW: 15 % — ABNORMAL HIGH (ref 11.2–14.5)
WBC: 4.7 10*3/uL (ref 3.9–10.3)

## 2017-06-11 LAB — COMPREHENSIVE METABOLIC PANEL
ALT: 18 U/L (ref 0–55)
AST: 20 U/L (ref 5–34)
Albumin: 3.7 g/dL (ref 3.5–5.0)
Alkaline Phosphatase: 75 U/L (ref 40–150)
Anion Gap: 11 mEq/L (ref 3–11)
BUN: 15.2 mg/dL (ref 7.0–26.0)
CALCIUM: 9.4 mg/dL (ref 8.4–10.4)
CHLORIDE: 106 meq/L (ref 98–109)
CO2: 24 mEq/L (ref 22–29)
Creatinine: 0.7 mg/dL (ref 0.6–1.1)
EGFR: 90 mL/min/{1.73_m2} — AB (ref >90)
Glucose: 98 mg/dl (ref 70–140)
POTASSIUM: 4.1 meq/L (ref 3.5–5.1)
Sodium: 142 mEq/L (ref 136–145)
Total Bilirubin: 0.27 mg/dL (ref 0.20–1.20)
Total Protein: 7 g/dL (ref 6.4–8.3)

## 2017-06-11 MED ORDER — SODIUM CHLORIDE 0.9 % IV SOLN
Freq: Once | INTRAVENOUS | Status: AC
  Administered 2017-06-11: 13:00:00 via INTRAVENOUS

## 2017-06-11 MED ORDER — PALONOSETRON HCL INJECTION 0.25 MG/5ML
0.2500 mg | Freq: Once | INTRAVENOUS | Status: AC
Start: 2017-06-11 — End: 2017-06-11
  Administered 2017-06-11: 0.25 mg via INTRAVENOUS

## 2017-06-11 MED ORDER — OXALIPLATIN CHEMO INJECTION 100 MG/20ML
85.0000 mg/m2 | Freq: Once | INTRAVENOUS | Status: AC
  Administered 2017-06-11: 135 mg via INTRAVENOUS
  Filled 2017-06-11: qty 20

## 2017-06-11 MED ORDER — DEXTROSE 5 % IV SOLN
400.0000 mg/m2 | Freq: Once | INTRAVENOUS | Status: AC
  Administered 2017-06-11: 640 mg via INTRAVENOUS
  Filled 2017-06-11: qty 32

## 2017-06-11 MED ORDER — OXYCODONE-ACETAMINOPHEN 5-325 MG PO TABS: 1.0000 | ORAL_TABLET | Freq: Four times a day (QID) | ORAL | 0 refills | Status: DC | PRN

## 2017-06-11 MED ORDER — SODIUM CHLORIDE 0.9% FLUSH
10.0000 mL | INTRAVENOUS | Status: DC | PRN
  Administered 2017-06-11: 10 mL via INTRAVENOUS
  Filled 2017-06-11: qty 10

## 2017-06-11 MED ORDER — DEXTROSE 5 % IV SOLN
Freq: Once | INTRAVENOUS | Status: AC
  Administered 2017-06-11: 14:00:00 via INTRAVENOUS

## 2017-06-11 MED ORDER — DEXAMETHASONE SODIUM PHOSPHATE 10 MG/ML IJ SOLN
10.0000 mg | Freq: Once | INTRAMUSCULAR | Status: AC
  Administered 2017-06-11: 10 mg via INTRAVENOUS

## 2017-06-11 MED ORDER — PALONOSETRON HCL INJECTION 0.25 MG/5ML
INTRAVENOUS | Status: AC
  Filled 2017-06-11: qty 5

## 2017-06-11 MED ORDER — FLUOROURACIL CHEMO INJECTION 2.5 GM/50ML
400.0000 mg/m2 | Freq: Once | INTRAVENOUS | Status: AC
  Administered 2017-06-11: 650 mg via INTRAVENOUS
  Filled 2017-06-11: qty 13

## 2017-06-11 MED ORDER — FLUOROURACIL CHEMO INJECTION 5 GM/100ML
2400.0000 mg/m2 | INTRAVENOUS | Status: DC
  Administered 2017-06-11: 3850 mg via INTRAVENOUS
  Filled 2017-06-11: qty 77

## 2017-06-11 MED ORDER — BEVACIZUMAB CHEMO INJECTION 400 MG/16ML
5.0000 mg/kg | Freq: Once | INTRAVENOUS | Status: AC
  Administered 2017-06-11: 250 mg via INTRAVENOUS
  Filled 2017-06-11: qty 10

## 2017-06-11 MED ORDER — DEXAMETHASONE SODIUM PHOSPHATE 10 MG/ML IJ SOLN
INTRAMUSCULAR | Status: AC
  Filled 2017-06-11: qty 1

## 2017-06-11 NOTE — Telephone Encounter (Signed)
Scheduled appt per 6/13 los - Gave patient calender - did not want AVS.

## 2017-06-11 NOTE — Patient Instructions (Addendum)
Yolanda Watson Discharge Instructions for Patients Receiving Chemotherapy  Today you received the following chemotherapy agents: Oxaliplatin, Leucovorin, Adrucil (5FU)  To help prevent nausea and vomiting after your treatment, we encourage you to take your nausea medication as prescribed.   If you develop nausea and vomiting that is not controlled by your nausea medication, call the clinic.   BELOW ARE SYMPTOMS THAT SHOULD BE REPORTED IMMEDIATELY:  *FEVER GREATER THAN 100.5 F  *CHILLS WITH OR WITHOUT FEVER  NAUSEA AND VOMITING THAT IS NOT CONTROLLED WITH YOUR NAUSEA MEDICATION  *UNUSUAL SHORTNESS OF BREATH  *UNUSUAL BRUISING OR BLEEDING  TENDERNESS IN MOUTH AND THROAT WITH OR WITHOUT PRESENCE OF ULCERS  *URINARY PROBLEMS  *BOWEL PROBLEMS  UNUSUAL RASH Items with * indicate a potential emergency and should be followed up as soon as possible.  Feel free to call the clinic you have any questions or concerns. The clinic phone number is [phone].  Please show the [place] at check-in to the Emergency Department and triage nurse.   Bevacizumab injection What is this medicine? BEVACIZUMAB (be va SIZ yoo mab) is a monoclonal antibody. It is used to treat many types of cancer. This medicine may be used for other purposes; ask your health care provider or pharmacist if you have questions. COMMON BRAND NAME(S): Avastin What should I tell my health care provider before I take this medicine? They need to know if you have any of these conditions: -diabetes -heart disease -high blood pressure -history of coughing up blood -prior anthracycline chemotherapy (e.g., doxorubicin, daunorubicin, epirubicin) -recent or ongoing radiation therapy -recent or planning to have surgery -stroke -an unusual or allergic reaction to bevacizumab, hamster proteins, mouse proteins, other medicines, foods, dyes, or preservatives -pregnant or trying to get  pregnant -breast-feeding How should I use this medicine? This medicine is for infusion into a vein. It is given by a health care professional in a hospital or clinic setting. Talk to your pediatrician regarding the use of this medicine in children. Special care may be needed. Overdosage: If you think you have taken too much of this medicine contact a poison control center or emergency room at once. NOTE: This medicine is only for you. Do not share this medicine with others. What if I miss a dose? It is important not to miss your dose. Call your doctor or health care professional if you are unable to keep an appointment. What may interact with this medicine? Interactions are not expected. This list may not describe all possible interactions. Give your health care provider a list of all the medicines, herbs, non-prescription drugs, or dietary supplements you use. Also tell them if you smoke, drink alcohol, or use illegal drugs. Some items may interact with your medicine. What should I watch for while using this medicine? Your condition will be monitored carefully while you are receiving this medicine. You will need important blood work and urine testing done while you are taking this medicine. This medicine may increase your risk to bruise or bleed. Call your doctor or health care professional if you notice any unusual bleeding. This medicine should be started at least 28 days following major surgery and the site of the surgery should be totally healed. Check with your doctor before scheduling dental work or surgery while you are receiving this treatment. Talk to your doctor if you have recently had surgery or if you have a wound that has not healed. Do not become pregnant while taking this medicine or  for 6 months after stopping it. Women should inform their doctor if they wish to become pregnant or think they might be pregnant. There is a potential for serious side effects to an unborn child. Talk to  your health care professional or pharmacist for more information. Do not breast-feed an infant while taking this medicine and for 6 months after the last dose. This medicine has caused ovarian failure in some women. This medicine may interfere with the ability to have a child. You should talk to your doctor or health care professional if you are concerned about your fertility. What side effects may I notice from receiving this medicine? Side effects that you should report to your doctor or health care professional as soon as possible: -allergic reactions like skin rash, itching or hives, swelling of the face, lips, or tongue -chest pain or chest tightness -chills -coughing up blood -high fever -seizures -severe constipation -signs and symptoms of bleeding such as bloody or black, tarry stools; red or dark-brown urine; spitting up blood or brown material that looks like coffee grounds; red spots on the skin; unusual bruising or bleeding from the eye, gums, or nose -signs and symptoms of a blood clot such as breathing problems; chest pain; severe, sudden headache; pain, swelling, warmth in the leg -signs and symptoms of a stroke like changes in vision; confusion; trouble speaking or understanding; severe headaches; sudden numbness or weakness of the face, arm or leg; trouble walking; dizziness; loss of balance or coordination -stomach pain -sweating -swelling of legs or ankles -vomiting -weight gain Side effects that usually do not require medical attention (report to your doctor or health care professional if they continue or are bothersome): -back pain -changes in taste -decreased appetite -dry skin -nausea -tiredness This list may not describe all possible side effects. Call your doctor for medical advice about side effects. You may report side effects to FDA at 1-800-FDA-1088. Where should I keep my medicine? This drug is given in a hospital or clinic and will not be stored at  home. NOTE: This sheet is a summary. It may not cover all possible information. If you have questions about this medicine, talk to your doctor, pharmacist, or health care provider.  2018 Elsevier/Gold Standard (2016-12-13 14:33:29)

## 2017-06-11 NOTE — Progress Notes (Signed)
Patient's spouse brought in form [place] needed him to complete and fax back. He answered what he could and obtained additional information from the spouse. I faxed the application to [place]. Fax received ok per confirmation sheet. I gave original form back to him.

## 2017-06-11 NOTE — Progress Notes (Signed)
  [place] OFFICE PROGRESS NOTE   Diagnosis: Colon cancer  INTERVAL HISTORY:   Yolanda Watson returns as scheduled. She completed another cycle of FOLFOX 05/28/2017. She had mild cold sensitivity following chemotherapy. No neuropathy symptoms at present. No nausea/vomiting, mouth sores, or diarrhea. The abdominal mass has not changed in size. She is noted some improvement of the pain. She is taking oxycodone less frequently.  Objective:  Vital signs in last 24 hours:  Blood pressure (!) 150/71, pulse 76, temperature 98.5 F (36.9 C), temperature source Oral, resp. rate 18, height '5\' 6"'$  (1.676 m), weight 115 lb 4.8 oz (52.3 kg), SpO2 100 %.    HEENT: No thrush or ulcers Lymphatics: 1 cm firm mobile right inguinal node Resp: Lungs clear bilaterally Cardio: Regular rate and rhythm GI: No hepatosplenomegaly, firm mass occupying the low abdomen/suprapubic area Vascular: No leg edema    Portacath/PICC-without erythema  Lab Results:  Lab Results  Component Value Date   WBC 4.7 06/11/2017   HGB 12.2 06/11/2017   HCT 37.0 06/11/2017   MCV 95.9 06/11/2017   PLT 154 06/11/2017   NEUTROABS 2.3 06/11/2017    CMP     Component Value Date/Time   NA 142 06/11/2017 1023   K 4.1 06/11/2017 1023   CL 104 04/22/2017 1642   CO2 24 06/11/2017 1023   GLUCOSE 98 06/11/2017 1023   BUN 15.2 06/11/2017 1023   CREATININE 0.7 06/11/2017 1023   CALCIUM 9.4 06/11/2017 1023   PROT 7.0 06/11/2017 1023   ALBUMIN 3.7 06/11/2017 1023   AST 20 06/11/2017 1023   ALT 18 06/11/2017 1023   ALKPHOS 75 06/11/2017 1023   BILITOT 0.27 06/11/2017 1023   GFRNONAA >60 04/22/2017 1642   GFRAA >60 04/22/2017 1642    Lab Results  Component Value Date   CEA1 4.21 05/05/2017     Medications: I have reviewed the patient's current medications.  Assessment/Plan: 1. Metastatic colon cancer presenting with an Abdominal/pelvic mass  2.      Colon cancer diagnosed in [place] in  2016-pathology report dated 02/15/2015-invasive adenocarcinoma, well-differentiated with prominent mucinous features; microscopic tumor extension into mesenteric adipose tissue and focally involving visceral peritoneum; radial and mucosal margins negative; lymph-vascular invasion withsuspicious foci identified;perineural invasion not identified; 16 negative lymph nodes; tumor deposits not identified; pT4apN0;MSI stable  ? CT 04/22/2017 revealed a heterogenous anterior pelvic wall mass, right inguinal mass, top normal sized retroperitoneal and iliac nodes, abutment of the anterior dome of the bladder and a loop of small bowel ? 04/29/2017 biopsy of anterior abdominal/pelvic wall mass-acellular mucin dissecting fibrous stroma ? Cycle 1 FOLFOX 05/15/2017 ? Cycle 2 FOLFOX 05/28/2017 ? Cycle 3 FOLFOX with Avastin on 06/11/2017  3.  Ongoing tobacco and alcohol use  Disposition:  Ms. Brinkley appears unchanged. The right inguinal lymph node is slightly smaller, but the low abdominal mass is unchanged. She continues to take oxycodone as needed for pain. She will add naproxen.  I recommend adding Avastin. We reviewed the potential toxicities associated with Avastin including the chance for an allergic reaction, CNS toxicity, nephrotoxicity, hypertension, bleeding, bowel perforation, and thromboembolic disease. She agrees to proceed.  Avastin will be added with chemotherapy today. Ms. Faivre will return for an office visit and chemotherapy in 2 weeks. 25 minutes were spent with the patient today. The majority of the time was used for counseling and coordination of care.  Donneta Romberg, MD  06/11/2017  12:32 PM

## 2017-06-11 NOTE — Patient Instructions (Signed)

## 2017-06-12 ENCOUNTER — Encounter: Payer: Self-pay | Admitting: Pharmacy Technician

## 2017-06-12 NOTE — Progress Notes (Signed)
Met with patient during Tx on 6/13 to sign drug assist application for Neulasta and Avastin. Awaiting approvals.

## 2017-06-13 ENCOUNTER — Ambulatory Visit (HOSPITAL_BASED_OUTPATIENT_CLINIC_OR_DEPARTMENT_OTHER): Payer: Medicaid Other

## 2017-06-13 VITALS — BP 154/85 | HR 78 | Temp 98.8°F | Resp 18

## 2017-06-13 DIAGNOSIS — C182 Malignant neoplasm of ascending colon: Secondary | ICD-10-CM

## 2017-06-13 DIAGNOSIS — Z452 Encounter for adjustment and management of vascular access device: Secondary | ICD-10-CM

## 2017-06-13 MED ORDER — HEPARIN SOD (PORK) LOCK FLUSH 100 UNIT/ML IV SOLN
500.0000 [IU] | Freq: Once | INTRAVENOUS | Status: AC | PRN
  Administered 2017-06-13: 500 [IU]
  Filled 2017-06-13: qty 5

## 2017-06-13 MED ORDER — SODIUM CHLORIDE 0.9% FLUSH
10.0000 mL | INTRAVENOUS | Status: DC | PRN
  Administered 2017-06-13: 10 mL
  Filled 2017-06-13: qty 10

## 2017-06-17 ENCOUNTER — Encounter: Payer: Self-pay | Admitting: Oncology

## 2017-06-17 NOTE — Progress Notes (Signed)
Patient called to advise she has not heard anything from the hospital regarding Medicaid. Advised patient [facility] will reach out to patient within 90 days and the customer service department would send her a letter regarding her [place].   Sent an email to [employer] Adult nurse) to inquire about status of [place]. They may be behind due to vacations. The patient may hear something shortly.  She has my card for any additional financial questions or concerns.

## 2017-06-18 ENCOUNTER — Encounter: Payer: Self-pay | Admitting: Pharmacy Technician

## 2017-06-18 NOTE — Progress Notes (Signed)
The patient is approved for drug assistance by [employer] for Neulasta with coverage of 06/17/17 - 12/29/17. The patient qualifies for Medicare on 12/21/17. An application is pending with [employer] for Avastin.

## 2017-06-22 ENCOUNTER — Other Ambulatory Visit: Payer: Self-pay | Admitting: Oncology

## 2017-06-25 ENCOUNTER — Other Ambulatory Visit (HOSPITAL_BASED_OUTPATIENT_CLINIC_OR_DEPARTMENT_OTHER): Payer: Medicaid Other

## 2017-06-25 ENCOUNTER — Ambulatory Visit (HOSPITAL_BASED_OUTPATIENT_CLINIC_OR_DEPARTMENT_OTHER): Payer: Medicaid Other

## 2017-06-25 ENCOUNTER — Ambulatory Visit (HOSPITAL_BASED_OUTPATIENT_CLINIC_OR_DEPARTMENT_OTHER): Payer: Medicaid Other | Admitting: Oncology

## 2017-06-25 ENCOUNTER — Telehealth: Payer: Self-pay | Admitting: Oncology

## 2017-06-25 VITALS — BP 154/52 | HR 77 | Temp 97.6°F | Resp 18

## 2017-06-25 VITALS — BP 138/118 | HR 71 | Temp 97.7°F | Resp 18 | Ht 66.0 in | Wt 112.2 lb

## 2017-06-25 DIAGNOSIS — Z5112 Encounter for antineoplastic immunotherapy: Secondary | ICD-10-CM

## 2017-06-25 DIAGNOSIS — Z452 Encounter for adjustment and management of vascular access device: Secondary | ICD-10-CM | POA: Diagnosis present

## 2017-06-25 DIAGNOSIS — C189 Malignant neoplasm of colon, unspecified: Secondary | ICD-10-CM

## 2017-06-25 DIAGNOSIS — Z95828 Presence of other vascular implants and grafts: Secondary | ICD-10-CM

## 2017-06-25 DIAGNOSIS — Z5111 Encounter for antineoplastic chemotherapy: Secondary | ICD-10-CM

## 2017-06-25 DIAGNOSIS — C182 Malignant neoplasm of ascending colon: Secondary | ICD-10-CM

## 2017-06-25 LAB — COMPREHENSIVE METABOLIC PANEL
ALBUMIN: 3.8 g/dL (ref 3.5–5.0)
ALK PHOS: 84 U/L (ref 40–150)
ALT: 23 U/L (ref 0–55)
ANION GAP: 13 meq/L — AB (ref 3–11)
AST: 22 U/L (ref 5–34)
BILIRUBIN TOTAL: 0.45 mg/dL (ref 0.20–1.20)
BUN: 15.2 mg/dL (ref 7.0–26.0)
CO2: 22 meq/L (ref 22–29)
Calcium: 9.6 mg/dL (ref 8.4–10.4)
Chloride: 105 mEq/L (ref 98–109)
Creatinine: 0.7 mg/dL (ref 0.6–1.1)
Glucose: 91 mg/dl (ref 70–140)
Potassium: 3.8 mEq/L (ref 3.5–5.1)
Sodium: 139 mEq/L (ref 136–145)
TOTAL PROTEIN: 7.2 g/dL (ref 6.4–8.3)

## 2017-06-25 LAB — CBC WITH DIFFERENTIAL/PLATELET
BASO%: 0.6 % (ref 0.0–2.0)
BASOS ABS: 0 10*3/uL (ref 0.0–0.1)
EOS ABS: 0.1 10*3/uL (ref 0.0–0.5)
EOS%: 1.9 % (ref 0.0–7.0)
HCT: 37.8 % (ref 34.8–46.6)
HGB: 12.6 g/dL (ref 11.6–15.9)
LYMPH%: 39.9 % (ref 14.0–49.7)
MCH: 31.7 pg (ref 25.1–34.0)
MCHC: 33.3 g/dL (ref 31.5–36.0)
MCV: 95 fL (ref 79.5–101.0)
MONO#: 0.6 10*3/uL (ref 0.1–0.9)
MONO%: 12.6 % (ref 0.0–14.0)
NEUT#: 2.2 10*3/uL (ref 1.5–6.5)
NEUT%: 45 % (ref 38.4–76.8)
PLATELETS: 183 10*3/uL (ref 145–400)
RBC: 3.98 10*6/uL (ref 3.70–5.45)
RDW: 15.8 % — ABNORMAL HIGH (ref 11.2–14.5)
WBC: 4.9 10*3/uL (ref 3.9–10.3)
lymph#: 1.9 10*3/uL (ref 0.9–3.3)

## 2017-06-25 LAB — UA PROTEIN, DIPSTICK - CHCC: Protein, ur: <30 mg/dL

## 2017-06-25 MED ORDER — DEXTROSE 5 % IV SOLN
400.0000 mg/m2 | Freq: Once | INTRAVENOUS | Status: AC
  Administered 2017-06-25: 640 mg via INTRAVENOUS
  Filled 2017-06-25: qty 32

## 2017-06-25 MED ORDER — PALONOSETRON HCL INJECTION 0.25 MG/5ML
0.2500 mg | Freq: Once | INTRAVENOUS | Status: AC
  Administered 2017-06-25: 0.25 mg via INTRAVENOUS

## 2017-06-25 MED ORDER — SODIUM CHLORIDE 0.9% FLUSH
10.0000 mL | INTRAVENOUS | Status: DC | PRN
  Administered 2017-06-25: 10 mL via INTRAVENOUS
  Filled 2017-06-25: qty 10

## 2017-06-25 MED ORDER — DEXAMETHASONE SODIUM PHOSPHATE 10 MG/ML IJ SOLN
10.0000 mg | Freq: Once | INTRAMUSCULAR | Status: AC
  Administered 2017-06-25: 10 mg via INTRAVENOUS

## 2017-06-25 MED ORDER — SODIUM CHLORIDE 0.9 % IV SOLN
Freq: Once | INTRAVENOUS | Status: AC
  Administered 2017-06-25: 16:00:00 via INTRAVENOUS

## 2017-06-25 MED ORDER — DEXAMETHASONE SODIUM PHOSPHATE 10 MG/ML IJ SOLN
INTRAMUSCULAR | Status: AC
  Filled 2017-06-25: qty 1

## 2017-06-25 MED ORDER — HEPARIN SOD (PORK) LOCK FLUSH 100 UNIT/ML IV SOLN
500.0000 [IU] | Freq: Once | INTRAVENOUS | Status: DC | PRN
  Filled 2017-06-25: qty 5

## 2017-06-25 MED ORDER — SODIUM CHLORIDE 0.9 % IV SOLN
2400.0000 mg/m2 | INTRAVENOUS | Status: DC
  Administered 2017-06-25: 3850 mg via INTRAVENOUS
  Filled 2017-06-25: qty 77

## 2017-06-25 MED ORDER — DEXTROSE 5 % IV SOLN
Freq: Once | INTRAVENOUS | Status: AC
  Administered 2017-06-25: 12:00:00 via INTRAVENOUS

## 2017-06-25 MED ORDER — OXALIPLATIN CHEMO INJECTION 100 MG/20ML
85.0000 mg/m2 | Freq: Once | INTRAVENOUS | Status: AC
  Administered 2017-06-25: 135 mg via INTRAVENOUS
  Filled 2017-06-25: qty 20

## 2017-06-25 MED ORDER — FLUOROURACIL CHEMO INJECTION 2.5 GM/50ML
400.0000 mg/m2 | Freq: Once | INTRAVENOUS | Status: AC
  Administered 2017-06-25: 650 mg via INTRAVENOUS
  Filled 2017-06-25: qty 13

## 2017-06-25 MED ORDER — SODIUM CHLORIDE 0.9% FLUSH
10.0000 mL | INTRAVENOUS | Status: DC | PRN
  Filled 2017-06-25: qty 10

## 2017-06-25 MED ORDER — PALONOSETRON HCL INJECTION 0.25 MG/5ML
INTRAVENOUS | Status: AC
  Filled 2017-06-25: qty 5

## 2017-06-25 MED ORDER — BEVACIZUMAB CHEMO INJECTION 400 MG/16ML
5.0000 mg/kg | Freq: Once | INTRAVENOUS | Status: AC
  Administered 2017-06-25: 250 mg via INTRAVENOUS
  Filled 2017-06-25: qty 10

## 2017-06-25 NOTE — Patient Instructions (Addendum)
[place] Discharge Instructions for Patients Receiving Chemotherapy  Today you received the following chemotherapy agents Oxaliplatin, Leucovorin, Florouracil, and Avastin  To help prevent nausea and vomiting after your treatment, we encourage you to take your nausea medication  As directed.  If you develop nausea and vomiting that is not controlled by your nausea medication, call the clinic.   BELOW ARE SYMPTOMS THAT SHOULD BE REPORTED IMMEDIATELY:  *FEVER GREATER THAN 100.5 F  *CHILLS WITH OR WITHOUT FEVER  NAUSEA AND VOMITING THAT IS NOT CONTROLLED WITH YOUR NAUSEA MEDICATION  *UNUSUAL SHORTNESS OF BREATH  *UNUSUAL BRUISING OR BLEEDING  TENDERNESS IN MOUTH AND THROAT WITH OR WITHOUT PRESENCE OF ULCERS  *URINARY PROBLEMS  *BOWEL PROBLEMS  UNUSUAL RASH Items with * indicate a potential emergency and should be followed up as soon as possible.  Feel free to call the clinic you have any questions or concerns. The clinic phone number is [phone].  Please show the [place] at check-in to the Emergency Department and triage nurse.

## 2017-06-25 NOTE — Progress Notes (Signed)
  [place] OFFICE PROGRESS NOTE   Diagnosis: Colon cancer  INTERVAL HISTORY:   Yolanda Watson returns as scheduled. She completed cycle 3 chemotherapy on 06/11/2017. She reports increased malaise following this cycle. She had one episode of mild bleeding when she blew her nose. The abdominal pain has improved. She is taking approximately 4 oxycodone tablets per day. No mouth sores, diarrhea, or neuropathy symptoms. She had pain involving to upper right-sided teeth following chemotherapy. This has improved.  Objective:  Vital signs in last 24 hours:  Blood pressure (!) 138/118, pulse 71, temperature 97.7 F (36.5 C), temperature source Oral, resp. rate 18, height '5\' 6"'$  (1.676 m), weight 112 lb 3.2 oz (50.9 kg), SpO2 99 %.    HEENT: No thrush or ulcers. Poor dentition. Lymphatics: 1 cm firm mobile right inguinal node Resp: Distant breath sounds, clear bilaterally Cardio: Regular rate and rhythm GI: No hepatosplenomegaly, firm lower abdominal mass with discoloration of the overlying skin at the upper aspect. Vascular: No leg edema Neuro: Very mild loss of vibratory sense at the fingertips bilaterally    Portacath/PICC-without erythema  Lab Results:  Lab Results  Component Value Date   WBC 4.9 06/25/2017   HGB 12.6 06/25/2017   HCT 37.8 06/25/2017   MCV 95.0 06/25/2017   PLT 183 06/25/2017   NEUTROABS 2.2 06/25/2017    CMP     Component Value Date/Time   NA 139 06/25/2017 1005   K 3.8 06/25/2017 1005   CL 104 04/22/2017 1642   CO2 22 06/25/2017 1005   GLUCOSE 91 06/25/2017 1005   BUN 15.2 06/25/2017 1005   CREATININE 0.7 06/25/2017 1005   CALCIUM 9.6 06/25/2017 1005   PROT 7.2 06/25/2017 1005   ALBUMIN 3.8 06/25/2017 1005   AST 22 06/25/2017 1005   ALT 23 06/25/2017 1005   ALKPHOS 84 06/25/2017 1005   BILITOT 0.45 06/25/2017 1005   GFRNONAA >60 04/22/2017 1642   GFRAA >60 04/22/2017 1642     Medications: I have reviewed the patient's current  medications.  Assessment/Plan: 1. Metastatic colon cancer presenting with an Abdominal/pelvic mass  2. [place]-pathology report dated 02/15/2015-invasive adenocarcinoma, well-differentiated with prominent mucinous features; microscopic tumor extension into mesenteric adipose tissue and focally involving visceral peritoneum; radial and mucosal margins negative; lymph-vascular invasion withsuspicious foci identified;perineural invasion not identified; 16 negative lymph nodes; tumor deposits not identified; pT4apN0;MSI stable  ? CT 04/22/2017 revealed a heterogenous anterior pelvic wall mass, right inguinal mass, top normal sized retroperitoneal and iliac nodes, abutment of the anterior dome of the bladder and a loop of small bowel ? 04/29/2017 biopsy of anterior abdominal/pelvic wall mass-acellular mucin dissecting fibrous stroma ? Cycle 1 FOLFOX 05/15/2017 ? Cycle 2 FOLFOX 05/28/2017 ? Cycle 3 FOLFOX with Avastin on 06/11/2017 ? Cycle 4 FOLFOX/Avastin 06/25/2017  3.  Ongoing tobacco and alcohol use    Disposition:  Her overall status appears unchanged. The abdominal mass appears stable. The plan is to schedule a restaging CT after cycle 5.  She will return for an office visit and chemotherapy in 2 weeks.  We will repeat her blood pressure prior to receiving Avastin today.  15 minutes were spent with the patient today. The majority of the time was used for counseling and coordination of care.  Donneta Romberg, MD  06/25/2017  12:11 PM

## 2017-06-25 NOTE — Patient Instructions (Signed)

## 2017-06-25 NOTE — Telephone Encounter (Signed)
Gave patient relative avs report and appointments for June and July.  °

## 2017-06-27 ENCOUNTER — Ambulatory Visit (HOSPITAL_BASED_OUTPATIENT_CLINIC_OR_DEPARTMENT_OTHER): Payer: Medicaid Other

## 2017-06-27 VITALS — BP 135/78 | HR 81 | Temp 97.8°F | Resp 18

## 2017-06-27 DIAGNOSIS — C189 Malignant neoplasm of colon, unspecified: Secondary | ICD-10-CM | POA: Diagnosis not present

## 2017-06-27 DIAGNOSIS — Z452 Encounter for adjustment and management of vascular access device: Secondary | ICD-10-CM | POA: Diagnosis not present

## 2017-06-27 DIAGNOSIS — C182 Malignant neoplasm of ascending colon: Secondary | ICD-10-CM

## 2017-06-27 MED ORDER — HEPARIN SOD (PORK) LOCK FLUSH 100 UNIT/ML IV SOLN
500.0000 [IU] | Freq: Once | INTRAVENOUS | Status: AC | PRN
  Administered 2017-06-27: 500 [IU]
  Filled 2017-06-27: qty 5

## 2017-06-27 MED ORDER — SODIUM CHLORIDE 0.9% FLUSH
10.0000 mL | INTRAVENOUS | Status: DC | PRN
  Administered 2017-06-27: 10 mL
  Filled 2017-06-27: qty 10

## 2017-06-27 NOTE — Patient Instructions (Signed)
Implanted Port Home Guide An implanted port is a type of central line that is placed under the skin. Central lines are used to provide IV access when treatment or nutrition needs to be given through a person's veins. Implanted ports are used for long-term IV access. An implanted port may be placed because:  You need IV medicine that would be irritating to the small veins in your hands or arms.  You need long-term IV medicines, such as antibiotics.  You need IV nutrition for a long period.  You need frequent blood draws for lab tests.  You need dialysis.  Implanted ports are usually placed in the chest area, but they can also be placed in the upper arm, the abdomen, or the leg. An implanted port has two main parts:  Reservoir. The reservoir is round and will appear as a small, raised area under your skin. The reservoir is the part where a needle is inserted to give medicines or draw blood.  Catheter. The catheter is a thin, flexible tube that extends from the reservoir. The catheter is placed into a large vein. Medicine that is inserted into the reservoir goes into the catheter and then into the vein.  How will I care for my incision site? Do not get the incision site wet. Bathe or shower as directed by your health care provider. How is my port accessed? Special steps must be taken to access the port:  Before the port is accessed, a numbing cream can be placed on the skin. This helps numb the skin over the port site.  Your health care provider uses a sterile technique to access the port. ? Your health care provider must put on a mask and sterile gloves. ? The skin over your port is cleaned carefully with an antiseptic and allowed to dry. ? The port is gently pinched between sterile gloves, and a needle is inserted into the port.  Only "non-coring" port needles should be used to access the port. Once the port is accessed, a blood return should be checked. This helps ensure that the port  is in the vein and is not clogged.  If your port needs to remain accessed for a constant infusion, a clear (transparent) bandage will be placed over the needle site. The bandage and needle will need to be changed every week, or as directed by your health care provider.  Keep the bandage covering the needle clean and dry. Do not get it wet. Follow your health care provider's instructions on how to take a shower or bath while the port is accessed.  If your port does not need to stay accessed, no bandage is needed over the port.  What is flushing? Flushing helps keep the port from getting clogged. Follow your health care provider's instructions on how and when to flush the port. Ports are usually flushed with saline solution or a medicine called heparin. The need for flushing will depend on how the port is used.  If the port is used for intermittent medicines or blood draws, the port will need to be flushed: ? After medicines have been given. ? After blood has been drawn. ? As part of routine maintenance.  If a constant infusion is running, the port may not need to be flushed.  How long will my port stay implanted? The port can stay in for as long as your health care provider thinks it is needed. When it is time for the port to come out, surgery will be   done to remove it. The procedure is similar to the one performed when the port was put in. When should I seek immediate medical care? When you have an implanted port, you should seek immediate medical care if:  You notice a bad smell coming from the incision site.  You have swelling, redness, or drainage at the incision site.  You have more swelling or pain at the port site or the surrounding area.  You have a fever that is not controlled with medicine.  This information is not intended to replace advice given to you by your health care provider. Make sure you discuss any questions you have with your health care provider. Document  Released: 12/16/2005 Document Revised: 05/23/2016 Document Reviewed: 08/23/2013 Elsevier Interactive Patient Education  2017 Elsevier Inc.  

## 2017-07-06 ENCOUNTER — Other Ambulatory Visit: Payer: Self-pay | Admitting: Oncology

## 2017-07-09 ENCOUNTER — Encounter: Payer: Self-pay | Admitting: Oncology

## 2017-07-09 NOTE — Progress Notes (Signed)
Received additional paperwork via fax from [place] to be completed and returned. Reviewed and asked Tiffany. She advised due to nature of information requested, to ask RN to assist.  Patient coming in tomorrow, will have her complete portion and then give to RN.

## 2017-07-10 ENCOUNTER — Other Ambulatory Visit (HOSPITAL_BASED_OUTPATIENT_CLINIC_OR_DEPARTMENT_OTHER): Payer: Medicaid Other

## 2017-07-10 ENCOUNTER — Ambulatory Visit (HOSPITAL_BASED_OUTPATIENT_CLINIC_OR_DEPARTMENT_OTHER): Payer: Medicaid Other | Admitting: Nurse Practitioner

## 2017-07-10 ENCOUNTER — Ambulatory Visit (HOSPITAL_BASED_OUTPATIENT_CLINIC_OR_DEPARTMENT_OTHER): Payer: Medicaid Other

## 2017-07-10 ENCOUNTER — Ambulatory Visit: Payer: Self-pay

## 2017-07-10 VITALS — BP 160/76 | HR 76 | Temp 97.7°F | Resp 18 | Ht 66.0 in | Wt 113.1 lb

## 2017-07-10 DIAGNOSIS — C189 Malignant neoplasm of colon, unspecified: Secondary | ICD-10-CM

## 2017-07-10 DIAGNOSIS — Z5111 Encounter for antineoplastic chemotherapy: Secondary | ICD-10-CM

## 2017-07-10 DIAGNOSIS — Z5112 Encounter for antineoplastic immunotherapy: Secondary | ICD-10-CM

## 2017-07-10 DIAGNOSIS — C182 Malignant neoplasm of ascending colon: Secondary | ICD-10-CM

## 2017-07-10 DIAGNOSIS — D701 Agranulocytosis secondary to cancer chemotherapy: Secondary | ICD-10-CM

## 2017-07-10 LAB — CBC WITH DIFFERENTIAL/PLATELET
BASO%: 0.6 % (ref 0.0–2.0)
Basophils Absolute: 0 10*3/uL (ref 0.0–0.1)
EOS%: 3.2 % (ref 0.0–7.0)
Eosinophils Absolute: 0.1 10*3/uL (ref 0.0–0.5)
HEMATOCRIT: 36.8 % (ref 34.8–46.6)
HGB: 12.3 g/dL (ref 11.6–15.9)
LYMPH%: 47.2 % (ref 14.0–49.7)
MCH: 32.3 pg (ref 25.1–34.0)
MCHC: 33.4 g/dL (ref 31.5–36.0)
MCV: 96.6 fL (ref 79.5–101.0)
MONO#: 0.4 10*3/uL (ref 0.1–0.9)
MONO%: 11.6 % (ref 0.0–14.0)
NEUT#: 1.3 10*3/uL — ABNORMAL LOW (ref 1.5–6.5)
NEUT%: 37.4 % — AB (ref 38.4–76.8)
Platelets: 144 10*3/uL — ABNORMAL LOW (ref 145–400)
RBC: 3.81 10*6/uL (ref 3.70–5.45)
RDW: 17.3 % — ABNORMAL HIGH (ref 11.2–14.5)
WBC: 3.5 10*3/uL — ABNORMAL LOW (ref 3.9–10.3)
lymph#: 1.6 10*3/uL (ref 0.9–3.3)

## 2017-07-10 LAB — COMPREHENSIVE METABOLIC PANEL
ALT: 26 U/L (ref 0–55)
AST: 29 U/L (ref 5–34)
Albumin: 3.8 g/dL (ref 3.5–5.0)
Alkaline Phosphatase: 87 U/L (ref 40–150)
Anion Gap: 10 mEq/L (ref 3–11)
BUN: 11.8 mg/dL (ref 7.0–26.0)
CALCIUM: 8.9 mg/dL (ref 8.4–10.4)
CHLORIDE: 107 meq/L (ref 98–109)
CO2: 22 meq/L (ref 22–29)
CREATININE: 0.8 mg/dL (ref 0.6–1.1)
EGFR: 84 mL/min/{1.73_m2} — ABNORMAL LOW (ref >90)
GLUCOSE: 99 mg/dL (ref 70–140)
Potassium: 4.1 mEq/L (ref 3.5–5.1)
SODIUM: 139 meq/L (ref 136–145)
Total Bilirubin: 0.35 mg/dL (ref 0.20–1.20)
Total Protein: 7 g/dL (ref 6.4–8.3)

## 2017-07-10 MED ORDER — PALONOSETRON HCL INJECTION 0.25 MG/5ML
INTRAVENOUS | Status: AC
  Filled 2017-07-10: qty 5

## 2017-07-10 MED ORDER — SODIUM CHLORIDE 0.9 % IV SOLN
2400.0000 mg/m2 | INTRAVENOUS | Status: DC
  Administered 2017-07-10: 3850 mg via INTRAVENOUS
  Filled 2017-07-10: qty 77

## 2017-07-10 MED ORDER — FLUOROURACIL CHEMO INJECTION 2.5 GM/50ML
400.0000 mg/m2 | Freq: Once | INTRAVENOUS | Status: AC
Start: 2017-07-10 — End: 2017-07-10
  Administered 2017-07-10: 650 mg via INTRAVENOUS
  Filled 2017-07-10: qty 13

## 2017-07-10 MED ORDER — OXYCODONE-ACETAMINOPHEN 5-325 MG PO TABS: 1.0000 | ORAL_TABLET | Freq: Four times a day (QID) | ORAL | 0 refills | Status: DC | PRN

## 2017-07-10 MED ORDER — SODIUM CHLORIDE 0.9 % IV SOLN
Freq: Once | INTRAVENOUS | Status: AC
  Administered 2017-07-10: 11:00:00 via INTRAVENOUS

## 2017-07-10 MED ORDER — PALONOSETRON HCL INJECTION 0.25 MG/5ML
0.2500 mg | Freq: Once | INTRAVENOUS | Status: AC
  Administered 2017-07-10: 0.25 mg via INTRAVENOUS

## 2017-07-10 MED ORDER — SODIUM CHLORIDE 0.9 % IV SOLN
5.0000 mg/kg | Freq: Once | INTRAVENOUS | Status: AC
  Administered 2017-07-10: 250 mg via INTRAVENOUS
  Filled 2017-07-10: qty 10

## 2017-07-10 MED ORDER — DEXTROSE 5 % IV SOLN
Freq: Once | INTRAVENOUS | Status: AC
  Administered 2017-07-10: 13:00:00 via INTRAVENOUS

## 2017-07-10 MED ORDER — DEXAMETHASONE SODIUM PHOSPHATE 10 MG/ML IJ SOLN
INTRAMUSCULAR | Status: AC
  Filled 2017-07-10: qty 1

## 2017-07-10 MED ORDER — OXALIPLATIN CHEMO INJECTION 100 MG/20ML
85.0000 mg/m2 | Freq: Once | INTRAVENOUS | Status: AC
  Administered 2017-07-10: 135 mg via INTRAVENOUS
  Filled 2017-07-10: qty 7

## 2017-07-10 MED ORDER — LEUCOVORIN CALCIUM INJECTION 350 MG
400.0000 mg/m2 | Freq: Once | INTRAVENOUS | Status: AC
  Administered 2017-07-10: 640 mg via INTRAVENOUS
  Filled 2017-07-10: qty 32

## 2017-07-10 MED ORDER — DEXAMETHASONE SODIUM PHOSPHATE 10 MG/ML IJ SOLN
10.0000 mg | Freq: Once | INTRAMUSCULAR | Status: AC
  Administered 2017-07-10: 10 mg via INTRAVENOUS

## 2017-07-10 NOTE — Patient Instructions (Signed)
Implanted Port Home Guide An implanted port is a type of central line that is placed under the skin. Central lines are used to provide IV access when treatment or nutrition needs to be given through a person's veins. Implanted ports are used for long-term IV access. An implanted port may be placed because:  You need IV medicine that would be irritating to the small veins in your hands or arms.  You need long-term IV medicines, such as antibiotics.  You need IV nutrition for a long period.  You need frequent blood draws for lab tests.  You need dialysis.  Implanted ports are usually placed in the chest area, but they can also be placed in the upper arm, the abdomen, or the leg. An implanted port has two main parts:  Reservoir. The reservoir is round and will appear as a small, raised area under your skin. The reservoir is the part where a needle is inserted to give medicines or draw blood.  Catheter. The catheter is a thin, flexible tube that extends from the reservoir. The catheter is placed into a large vein. Medicine that is inserted into the reservoir goes into the catheter and then into the vein.  How will I care for my incision site? Do not get the incision site wet. Bathe or shower as directed by your health care provider. How is my port accessed? Special steps must be taken to access the port:  Before the port is accessed, a numbing cream can be placed on the skin. This helps numb the skin over the port site.  Your health care provider uses a sterile technique to access the port. ? Your health care provider must put on a mask and sterile gloves. ? The skin over your port is cleaned carefully with an antiseptic and allowed to dry. ? The port is gently pinched between sterile gloves, and a needle is inserted into the port.  Only "non-coring" port needles should be used to access the port. Once the port is accessed, a blood return should be checked. This helps ensure that the port  is in the vein and is not clogged.  If your port needs to remain accessed for a constant infusion, a clear (transparent) bandage will be placed over the needle site. The bandage and needle will need to be changed every week, or as directed by your health care provider.  Keep the bandage covering the needle clean and dry. Do not get it wet. Follow your health care provider's instructions on how to take a shower or bath while the port is accessed.  If your port does not need to stay accessed, no bandage is needed over the port.  What is flushing? Flushing helps keep the port from getting clogged. Follow your health care provider's instructions on how and when to flush the port. Ports are usually flushed with saline solution or a medicine called heparin. The need for flushing will depend on how the port is used.  If the port is used for intermittent medicines or blood draws, the port will need to be flushed: ? After medicines have been given. ? After blood has been drawn. ? As part of routine maintenance.  If a constant infusion is running, the port may not need to be flushed.  How long will my port stay implanted? The port can stay in for as long as your health care provider thinks it is needed. When it is time for the port to come out, surgery will be   done to remove it. The procedure is similar to the one performed when the port was put in. When should I seek immediate medical care? When you have an implanted port, you should seek immediate medical care if:  You notice a bad smell coming from the incision site.  You have swelling, redness, or drainage at the incision site.  You have more swelling or pain at the port site or the surrounding area.  You have a fever that is not controlled with medicine.  This information is not intended to replace advice given to you by your health care provider. Make sure you discuss any questions you have with your health care provider. Document  Released: 12/16/2005 Document Revised: 05/23/2016 Document Reviewed: 08/23/2013 Elsevier Interactive Patient Education  2017 Elsevier Inc.  

## 2017-07-10 NOTE — Progress Notes (Signed)
Ok to treat with ANC 1.3 per Lisa Thomas, NP.  

## 2017-07-10 NOTE — Patient Instructions (Signed)
[facility] Discharge Instructions for Patients Receiving Chemotherapy  Today you received the following chemotherapy agents Avastin/Oxaliplatin/Leucovorin/5 FU To help prevent nausea and vomiting after your treatment, we encourage you to take your nausea medication as prescribed.   If you develop nausea and vomiting that is not controlled by your nausea medication, call the clinic.   BELOW ARE SYMPTOMS THAT SHOULD BE REPORTED IMMEDIATELY:  *FEVER GREATER THAN 100.5 F  *CHILLS WITH OR WITHOUT FEVER  NAUSEA AND VOMITING THAT IS NOT CONTROLLED WITH YOUR NAUSEA MEDICATION  *UNUSUAL SHORTNESS OF BREATH  *UNUSUAL BRUISING OR BLEEDING  TENDERNESS IN MOUTH AND THROAT WITH OR WITHOUT PRESENCE OF ULCERS  *URINARY PROBLEMS  *BOWEL PROBLEMS  UNUSUAL RASH Items with * indicate a potential emergency and should be followed up as soon as possible.  Feel free to call the clinic you have any questions or concerns. The clinic phone number is [phone].  Please show the [facility] at check-in to the Emergency Department and triage nurse.

## 2017-07-10 NOTE — Progress Notes (Signed)
  [facility] OFFICE PROGRESS NOTE   Diagnosis:  Colon cancer  INTERVAL HISTORY:   Yolanda Watson returns as scheduled. She completed cycle 4 FOLFOX plus Avastin 06/25/2017. She denies nausea/vomiting. No mouth sores. No diarrhea. No numbness or tingling in her hands or feet. She denies bleeding. No shortness of breath or chest pain. No leg swelling or calf pain. She thinks the abdominal mass is smaller. She notes she is using less pain medication.  Objective:  Vital signs in last 24 hours:  Blood pressure (!) 160/76, pulse 76, temperature 97.7 F (36.5 C), temperature source Oral, resp. rate 18, height _0  (1.676 m), weight 113 lb 1.6 oz (51.3 kg), SpO2 100 %.    HEENT: No thrush or ulcers. Resp: Distant breath sounds. Cardio: Regular rate and rhythm. GI: No hepatomegaly. Firm lower abdominal mass with discoloration of the overlying skin at the upper aspect. Vascular: No leg edema.  Port-A-Cath without erythema.  Lab Results:  Lab Results  Component Value Date   WBC 3.5 (L) 07/10/2017   HGB 12.3 07/10/2017   HCT 36.8 07/10/2017   MCV 96.6 07/10/2017   PLT 144 (L) 07/10/2017   NEUTROABS 1.3 (L) 07/10/2017    Imaging:  No results found.  Medications: I have reviewed the patient's current medications.  Assessment/Plan: 1. Metastatic colon cancer presenting with an Abdominal/pelvic mass  2. [place] cancerdiagnosed in [place] in 2016-pathology report dated 02/15/2015-invasive adenocarcinoma, well-differentiated with prominent mucinous features; microscopic tumor extension into mesenteric adipose tissue and focally involving visceral peritoneum; radial and mucosal margins negative; lymph-vascular invasion withsuspicious foci identified;perineural invasion not identified; 16 negative lymph nodes; tumor deposits not identified; pT4apN0;MSI stable  ? CT 04/22/2017 revealed a heterogenous anterior pelvic wall mass, right inguinal mass, top normal sized  retroperitoneal and iliac nodes, abutment of the anterior dome of the bladder and a loop of small bowel ? 04/29/2017 biopsy of anterior abdominal/pelvic wall mass-acellular mucin dissecting fibrous stroma ? Cycle 1 FOLFOX 05/15/2017 ? Cycle 2 FOLFOX 05/28/2017 ? Cycle 3 FOLFOX with Avastin on 06/11/2017 ? Cycle 4 FOLFOX/Avastin 06/25/2017 ? Cycle 5 FOLFOX/Avastin 07/10/2017, Neulasta added  3. Ongoing tobacco and alcohol use  4.  Mild neutropenia related to chemotherapy 07/10/2017. Neulasta added.   Disposition: Yolanda Watson appears stable. She has completed 4 cycles of FOLFOX plus Avastin. Plan to proceed with cycle 5 today as scheduled.  We reviewed today's labs. She has mild neutropenia. She will receive Neulasta on the day of pump discontinuation. We reviewed potential toxicities associated with Neulasta including bone pain, rash, splenic rupture. She is agreeable to proceed. We reviewed neutropenic precautions. She understands to contact the office with fever, chills, other signs of infection.  The plan is for restaging CTs prior to her next visit in 2 weeks. She will return for a follow-up visit on 07/24/2017. She will contact the office in the interim as outlined above or with any other problems.  Plan reviewed with Dr. Benay Spice. 25 minutes were spent face-to-face at today's visit with the majority of that time involved in counseling/coordination of care.    Ned Card ANP/GNP-BC   07/10/2017  10:51 AM

## 2017-07-12 ENCOUNTER — Ambulatory Visit (HOSPITAL_BASED_OUTPATIENT_CLINIC_OR_DEPARTMENT_OTHER): Payer: Medicaid Other

## 2017-07-12 VITALS — BP 152/90 | HR 82 | Temp 98.2°F | Resp 18

## 2017-07-12 DIAGNOSIS — C182 Malignant neoplasm of ascending colon: Secondary | ICD-10-CM

## 2017-07-12 DIAGNOSIS — C189 Malignant neoplasm of colon, unspecified: Secondary | ICD-10-CM

## 2017-07-12 DIAGNOSIS — D701 Agranulocytosis secondary to cancer chemotherapy: Secondary | ICD-10-CM | POA: Diagnosis present

## 2017-07-12 MED ORDER — HEPARIN SOD (PORK) LOCK FLUSH 100 UNIT/ML IV SOLN
500.0000 [IU] | Freq: Once | INTRAVENOUS | Status: AC | PRN
  Administered 2017-07-12: 500 [IU]
  Filled 2017-07-12: qty 5

## 2017-07-12 MED ORDER — PEGFILGRASTIM INJECTION 6 MG/0.6ML ~~LOC~~
6.0000 mg | PREFILLED_SYRINGE | Freq: Once | SUBCUTANEOUS | Status: AC
  Administered 2017-07-12: 6 mg via SUBCUTANEOUS

## 2017-07-12 MED ORDER — SODIUM CHLORIDE 0.9% FLUSH
10.0000 mL | INTRAVENOUS | Status: DC | PRN
  Administered 2017-07-12: 10 mL
  Filled 2017-07-12: qty 10

## 2017-07-12 NOTE — Patient Instructions (Signed)
Filgrastim, G-CSF injection  What is this medicine?  FILGRASTIM, G-CSF (fil GRA stim) is a granulocyte colony-stimulating factor that stimulates the growth of neutrophils, a type of white blood cell (WBC) important in the body's fight against infection. It is used to reduce the incidence of fever and infection in patients with certain types of cancer who are receiving chemotherapy that affects the bone marrow, to stimulate blood cell production for removal of WBCs from the body prior to a bone marrow transplantation, to reduce the incidence of fever and infection in patients who have severe chronic neutropenia, and to improve survival outcomes following high-dose radiation exposure that is toxic to the bone marrow.  This medicine may be used for other purposes; ask your health care provider or pharmacist if you have questions.  COMMON BRAND NAME(S): Neupogen, Zarxio  What should I tell my health care provider before I take this medicine?  They need to know if you have any of these conditions:  -kidney disease  -latex allergy  -ongoing radiation therapy  -sickle cell disease  -an unusual or allergic reaction to filgrastim, pegfilgrastim, other medicines, foods, dyes, or preservatives  -pregnant or trying to get pregnant  -breast-feeding  How should I use this medicine?  This medicine is for injection under the skin or infusion into a vein. As an infusion into a vein, it is usually given by a health care professional in a hospital or clinic setting. If you get this medicine at home, you will be taught how to prepare and give this medicine. Refer to the Instructions for Use that come with your medication packaging. Use exactly as directed. Take your medicine at regular intervals. Do not take your medicine more often than directed.  It is important that you put your used needles and syringes in a special sharps container. Do not put them in a trash can. If you do not have a sharps container, call your pharmacist or  healthcare provider to get one.  Talk to your pediatrician regarding the use of this medicine in children. While this drug may be prescribed for children as young as 7 months for selected conditions, precautions do apply.  Overdosage: If you think you have taken too much of this medicine contact a poison control center or emergency room at once.  NOTE: This medicine is only for you. Do not share this medicine with others.  What if I miss a dose?  It is important not to miss your dose. Call your doctor or health care professional if you miss a dose.  What may interact with this medicine?  This medicine may interact with the following medications:  -medicines that may cause a release of neutrophils, such as lithium  This list may not describe all possible interactions. Give your health care provider a list of all the medicines, herbs, non-prescription drugs, or dietary supplements you use. Also tell them if you smoke, drink alcohol, or use illegal drugs. Some items may interact with your medicine.  What should I watch for while using this medicine?  You may need blood work done while you are taking this medicine.  What side effects may I notice from receiving this medicine?  Side effects that you should report to your doctor or health care professional as soon as possible:  -allergic reactions like skin rash, itching or hives, swelling of the face, lips, or tongue  -dizziness or feeling faint  -fever  -pain, redness, or irritation at site where injected  -pinpoint red spots   on the skin  -shortness of breath or breathing problems  -signs and symptoms of kidney injury like trouble passing urine, change in the amount of urine, or red or dark-brown urine  -stomach or side pain, or pain at the shoulder  -swelling  -tiredness  -  unusual bleeding or bruising  Side effects that usually do not require medical attention (report to your doctor or health care professional if they continue or are bothersome):  -bone  pain  -headache  -muscle pain  This list may not describe all possible side effects. Call your doctor for medical advice about side effects. You may report side effects to FDA at 1-800-FDA-1088.  Where should I keep my medicine?  Keep out of the reach of children.  Store in a refrigerator between 2 and 8 degrees C (36 and 46 degrees F). Do not freeze. Keep in carton to protect from light. Throw away this medicine if vials or syringes are left out of the refrigerator for more than 24 hours. Throw away any unused medicine after the expiration date.  NOTE: This sheet is a summary. It may not cover all possible information. If you have questions about this medicine, talk to your doctor, pharmacist, or health care provider.  © 2018 Elsevier/Gold Standard (2015-07-05 10:57:13)

## 2017-07-19 ENCOUNTER — Other Ambulatory Visit: Payer: Self-pay | Admitting: Oncology

## 2017-07-21 ENCOUNTER — Encounter (HOSPITAL_COMMUNITY): Payer: Self-pay

## 2017-07-21 ENCOUNTER — Ambulatory Visit (HOSPITAL_COMMUNITY)
Admission: RE | Admit: 2017-07-21 | Discharge: 2017-07-21 | Disposition: A | Discharge status: Home / Self Care | Payer: Medicaid Other | Source: Ambulatory Visit | Attending: Nurse Practitioner | Admitting: Nurse Practitioner

## 2017-07-21 DIAGNOSIS — C189 Malignant neoplasm of colon, unspecified: Secondary | ICD-10-CM | POA: Diagnosis present

## 2017-07-21 DIAGNOSIS — C7989 Secondary malignant neoplasm of other specified sites: Secondary | ICD-10-CM | POA: Diagnosis not present

## 2017-07-21 MED ORDER — IOPAMIDOL (ISOVUE-300) INJECTION 61%
INTRAVENOUS | Status: AC
  Administered 2017-07-21: 80 mL via INTRAVENOUS
  Filled 2017-07-21: qty 100

## 2017-07-21 MED ORDER — HEPARIN SOD (PORK) LOCK FLUSH 100 UNIT/ML IV SOLN
500.0000 [IU] | Freq: Once | INTRAVENOUS | Status: AC
  Administered 2017-07-21: 500 [IU] via INTRAVENOUS

## 2017-07-21 MED ORDER — IOPAMIDOL (ISOVUE-300) INJECTION 61%
100.0000 mL | Freq: Once | INTRAVENOUS | Status: AC | PRN
  Administered 2017-07-21: 80 mL via INTRAVENOUS

## 2017-07-21 MED ORDER — HEPARIN SOD (PORK) LOCK FLUSH 100 UNIT/ML IV SOLN
INTRAVENOUS | Status: AC
Start: 2017-07-21 — End: 2017-07-21
  Administered 2017-07-21: 500 [IU] via INTRAVENOUS
  Filled 2017-07-21: qty 5

## 2017-07-24 ENCOUNTER — Ambulatory Visit (HOSPITAL_BASED_OUTPATIENT_CLINIC_OR_DEPARTMENT_OTHER): Payer: Medicaid Other | Admitting: Oncology

## 2017-07-24 ENCOUNTER — Ambulatory Visit: Payer: Self-pay

## 2017-07-24 ENCOUNTER — Other Ambulatory Visit (HOSPITAL_BASED_OUTPATIENT_CLINIC_OR_DEPARTMENT_OTHER): Payer: Medicaid Other

## 2017-07-24 ENCOUNTER — Encounter: Payer: Self-pay | Admitting: Radiation Oncology

## 2017-07-24 ENCOUNTER — Ambulatory Visit (HOSPITAL_BASED_OUTPATIENT_CLINIC_OR_DEPARTMENT_OTHER): Payer: Medicaid Other

## 2017-07-24 VITALS — BP 139/68 | HR 85 | Temp 97.8°F | Resp 18 | Ht 66.0 in | Wt 111.7 lb

## 2017-07-24 DIAGNOSIS — C182 Malignant neoplasm of ascending colon: Secondary | ICD-10-CM

## 2017-07-24 DIAGNOSIS — F1010 Alcohol abuse, uncomplicated: Secondary | ICD-10-CM

## 2017-07-24 DIAGNOSIS — Z720 Tobacco use: Secondary | ICD-10-CM

## 2017-07-24 DIAGNOSIS — D701 Agranulocytosis secondary to cancer chemotherapy: Secondary | ICD-10-CM

## 2017-07-24 DIAGNOSIS — Z452 Encounter for adjustment and management of vascular access device: Secondary | ICD-10-CM | POA: Diagnosis present

## 2017-07-24 DIAGNOSIS — Z95828 Presence of other vascular implants and grafts: Secondary | ICD-10-CM

## 2017-07-24 DIAGNOSIS — C189 Malignant neoplasm of colon, unspecified: Secondary | ICD-10-CM

## 2017-07-24 LAB — CBC WITH DIFFERENTIAL/PLATELET
BASO%: 0.1 % (ref 0.0–2.0)
Basophils Absolute: 0 10*3/uL (ref 0.0–0.1)
EOS ABS: 0.1 10*3/uL (ref 0.0–0.5)
EOS%: 0.4 % (ref 0.0–7.0)
HEMATOCRIT: 36.7 % (ref 34.8–46.6)
HEMOGLOBIN: 12.5 g/dL (ref 11.6–15.9)
LYMPH#: 2.5 10*3/uL (ref 0.9–3.3)
LYMPH%: 12 % — AB (ref 14.0–49.7)
MCH: 33.2 pg (ref 25.1–34.0)
MCHC: 34.1 g/dL (ref 31.5–36.0)
MCV: 97.6 fL (ref 79.5–101.0)
MONO#: 1 10*3/uL — AB (ref 0.1–0.9)
MONO%: 4.8 % (ref 0.0–14.0)
NEUT%: 82.7 % — AB (ref 38.4–76.8)
NEUTROS ABS: 17.2 10*3/uL — AB (ref 1.5–6.5)
PLATELETS: 123 10*3/uL — AB (ref 145–400)
RBC: 3.76 10*6/uL (ref 3.70–5.45)
RDW: 18.7 % — ABNORMAL HIGH (ref 11.2–14.5)
WBC: 20.8 10*3/uL — AB (ref 3.9–10.3)
nRBC: 0 % (ref 0–0)

## 2017-07-24 LAB — COMPREHENSIVE METABOLIC PANEL
ALK PHOS: 124 U/L (ref 40–150)
ALT: 19 U/L (ref 0–55)
ANION GAP: 12 meq/L — AB (ref 3–11)
AST: 23 U/L (ref 5–34)
Albumin: 4 g/dL (ref 3.5–5.0)
BILIRUBIN TOTAL: 0.33 mg/dL (ref 0.20–1.20)
BUN: 9.6 mg/dL (ref 7.0–26.0)
CALCIUM: 9.1 mg/dL (ref 8.4–10.4)
CHLORIDE: 103 meq/L (ref 98–109)
CO2: 22 mEq/L (ref 22–29)
CREATININE: 0.8 mg/dL (ref 0.6–1.1)
EGFR: 81 mL/min/{1.73_m2} — ABNORMAL LOW (ref >90)
Glucose: 104 mg/dl (ref 70–140)
Potassium: 3.6 mEq/L (ref 3.5–5.1)
Sodium: 137 mEq/L (ref 136–145)
Total Protein: 7 g/dL (ref 6.4–8.3)

## 2017-07-24 MED ORDER — SODIUM CHLORIDE 0.9% FLUSH
10.0000 mL | INTRAVENOUS | Status: AC | PRN
  Administered 2017-07-24: 10 mL via INTRAVENOUS
  Filled 2017-07-24: qty 10

## 2017-07-24 NOTE — Progress Notes (Signed)
[facility] OFFICE PROGRESS NOTE   Diagnosis: Colon cancer  INTERVAL HISTORY:   [name] returns as scheduled. She completed cycle 5 chemotherapy beginning 07/10/2017. She received Neulasta on 07/12/2017. No nausea/vomiting following chemotherapy. Cold sensitivity resolved. No neuropathy symptoms at present. She developed severe bone pain after receiving Neulasta. She continues to have pain in multiple joints which she attributes to Neulasta. Prior to the Neulasta she was down to approximately 1 oxycodone tablet per day.  Objective:  Vital signs in last 24 hours:  Blood pressure 139/68, pulse 85, temperature 97.8 F (36.6 C), temperature source Oral, resp. rate 18, height '5\' 6"'$  (1.676 m), weight 111 lb 11.2 oz (50.7 kg), SpO2 100 %.    HEENT: No thrush or ulcers Lymphatics: 1 cm mobile high right inguinal node, pea sized inferior right inguinal node Resp: Lungs clear bilaterally Cardio: Regular rate and rhythm GI: No hepatomegaly, firm immobile mass occupying the low abdomen/pelvis Vascular: No leg edema  Skin: Palms without erythema   Portacath/PICC-without erythema  Lab Results:  Lab Results  Component Value Date   WBC 3.5 (L) 07/10/2017   HGB 12.3 07/10/2017   HCT 36.8 07/10/2017   MCV 96.6 07/10/2017   PLT 144 (L) 07/10/2017   NEUTROABS 1.3 (L) 07/10/2017    CMP     Component Value Date/Time   NA 139 07/10/2017 0943   K 4.1 07/10/2017 0943   CL 104 04/22/2017 1642   CO2 22 07/10/2017 0943   GLUCOSE 99 07/10/2017 0943   BUN 11.8 07/10/2017 0943   CREATININE 0.8 07/10/2017 0943   CALCIUM 8.9 07/10/2017 0943   PROT 7.0 07/10/2017 0943   ALBUMIN 3.8 07/10/2017 0943   AST 29 07/10/2017 0943   ALT 26 07/10/2017 0943   ALKPHOS 87 07/10/2017 0943   BILITOT 0.35 07/10/2017 0943   GFRNONAA >60 04/22/2017 1642   GFRAA >60 04/22/2017 1642      Imaging:  Ct Abdomen Pelvis W Contrast  Result Date: 07/21/2017 CLINICAL DATA:  Colon cancer,  status post colostomy with reversal, now with protruding abdominal growth, chemotherapy ongoing EXAM: CT ABDOMEN AND PELVIS WITH CONTRAST TECHNIQUE: Multidetector CT imaging of the abdomen and pelvis was performed using the standard protocol following bolus administration of intravenous contrast. CONTRAST:  80 mL Isovue 300 IV COMPARISON:  04/22/2017 FINDINGS: Lower chest: Lung bases are clear. Hepatobiliary: Liver is notable for focal fat/ altered perfusion along the falciform ligament. No suspicious hepatic lesions. Calcified splenic granulomata. Gallbladder is unremarkable. No intrahepatic or extrahepatic ductal dilatation. Pancreas: Within normal limits. Spleen: Calcified splenic granulomata. Adrenals/Urinary Tract: Adrenal glands are within normal limits. 6 mm right upper pole renal cyst (series 7/image 15). Left kidney is within normal limits. No hydronephrosis. Bladder is within normal limits. Stomach/Bowel: Stomach is within normal limits. Status post right hemicolectomy with appendectomy. No evidence of bowel obstruction. Vascular/Lymphatic: No evidence of abdominal aortic aneurysm. Atherosclerotic calcifications of the abdominal aorta and branch vessels. 7 mm short axis portacaval node (series 2/ image 23), within normal limits. Small para-aortic nodes measuring up to 7 mm short axis, within normal limits. 1.5 cm short axis node in the right inguinal region (series 2/image 66), previously 2.0 cm. Reproductive: Uterus is within normal limits. Right ovary is within normal limits. 3.8 cm left ovarian dermoid. Other: No abdominopelvic ascites. 9.3 x 12.4 x 11.3 cm anterior abdominal wall metastasis, previously 10.5 x 13.3 x 11.5 cm when measured in a similar fashion. Musculoskeletal: Mild degenerative changes of the lumbar spine.  IMPRESSION: 9.3 x 12.4 x 11.3 cm anterior abdominal wall metastasis, mildly decreased. Adjacent 1.5 cm short axis right inguinal nodal metastasis, mildly decreased. Status post right  hemicolectomy. Electronically Signed   By: Julian Hy M.D.   On: 07/21/2017 10:19    Medications: I have reviewed the patient's current medications.  Assessment/Plan: 1. Metastatic colon cancer presenting with an Abdominal/pelvic mass  2. Colon cancerdiagnosed in [place] in 2016-pathology report dated 02/15/2015-invasive adenocarcinoma, well-differentiated with prominent mucinous features; microscopic tumor extension into mesenteric adipose tissue and focally involving visceral peritoneum; radial and mucosal margins negative; lymph-vascular invasion withsuspicious foci identified;perineural invasion not identified; 16 negative lymph nodes; tumor deposits not identified; pT4apN0;MSI stable  ? CT 04/22/2017 revealed a heterogenous anterior pelvic wall mass, right inguinal mass, top normal sized retroperitoneal and iliac nodes, abutment of the anterior dome of the bladder and a loop of small bowel ? 04/29/2017 biopsy of anterior abdominal/pelvic wall mass-acellular mucin dissecting fibrous stroma ? Cycle 1 FOLFOX 05/15/2017 ? Cycle 2 FOLFOX 05/28/2017 ? Cycle 3 FOLFOX with Avastin on 06/11/2017 ? Cycle 4 FOLFOX/Avastin 06/25/2017 ? Cycle 5 FOLFOX/Avastin 07/10/2017, Neulasta added ? Restaging CT 07/21/2017-mild decrease in the size of a right inguinal node and the anterior abdominal wall mass  3. Ongoing tobacco and alcohol use  4.  Mild neutropenia related to chemotherapy 07/10/2017. Neulasta added.     Disposition:  [name] has completed 5 cycles of FOLFOX. The abdominal wall mass is mildly smaller and her pain is much improved. The mass remains large and fixed. I will refer her to Dr. Lisbeth Renshaw to consider radiation. I will also refer her to Dr. Barry Dienes to discuss resection of the mass. [name] will return for an office visit in 2 weeks. We can consider switching to FOLFIRI-based chemotherapy if Dr. Lisbeth Renshaw does not feel there will be significant shrinkage with  radiation.  I reviewed the CT images with [name] and her husband.  25 minutes were spent with the patient today. The majority of the time was used for counseling and coordination of care.  Donneta Romberg, MD  07/24/2017  8:11 AM

## 2017-07-24 NOTE — Patient Instructions (Signed)
Implanted Port Home Guide An implanted port is a type of central line that is placed under the skin. Central lines are used to provide IV access when treatment or nutrition needs to be given through a person's veins. Implanted ports are used for long-term IV access. An implanted port may be placed because:  You need IV medicine that would be irritating to the small veins in your hands or arms.  You need long-term IV medicines, such as antibiotics.  You need IV nutrition for a long period.  You need frequent blood draws for lab tests.  You need dialysis.  Implanted ports are usually placed in the chest area, but they can also be placed in the upper arm, the abdomen, or the leg. An implanted port has two main parts:  Reservoir. The reservoir is round and will appear as a small, raised area under your skin. The reservoir is the part where a needle is inserted to give medicines or draw blood.  Catheter. The catheter is a thin, flexible tube that extends from the reservoir. The catheter is placed into a large vein. Medicine that is inserted into the reservoir goes into the catheter and then into the vein.  How will I care for my incision site? Do not get the incision site wet. Bathe or shower as directed by your health care provider. How is my port accessed? Special steps must be taken to access the port:  Before the port is accessed, a numbing cream can be placed on the skin. This helps numb the skin over the port site.  Your health care provider uses a sterile technique to access the port. ? Your health care provider must put on a mask and sterile gloves. ? The skin over your port is cleaned carefully with an antiseptic and allowed to dry. ? The port is gently pinched between sterile gloves, and a needle is inserted into the port.  Only "non-coring" port needles should be used to access the port. Once the port is accessed, a blood return should be checked. This helps ensure that the port  is in the vein and is not clogged.  If your port needs to remain accessed for a constant infusion, a clear (transparent) bandage will be placed over the needle site. The bandage and needle will need to be changed every week, or as directed by your health care provider.  Keep the bandage covering the needle clean and dry. Do not get it wet. Follow your health care provider's instructions on how to take a shower or bath while the port is accessed.  If your port does not need to stay accessed, no bandage is needed over the port.  What is flushing? Flushing helps keep the port from getting clogged. Follow your health care provider's instructions on how and when to flush the port. Ports are usually flushed with saline solution or a medicine called heparin. The need for flushing will depend on how the port is used.  If the port is used for intermittent medicines or blood draws, the port will need to be flushed: ? After medicines have been given. ? After blood has been drawn. ? As part of routine maintenance.  If a constant infusion is running, the port may not need to be flushed.  How long will my port stay implanted? The port can stay in for as long as your health care provider thinks it is needed. When it is time for the port to come out, surgery will be   done to remove it. The procedure is similar to the one performed when the port was put in. When should I seek immediate medical care? When you have an implanted port, you should seek immediate medical care if:  You notice a bad smell coming from the incision site.  You have swelling, redness, or drainage at the incision site.  You have more swelling or pain at the port site or the surrounding area.  You have a fever that is not controlled with medicine.  This information is not intended to replace advice given to you by your health care provider. Make sure you discuss any questions you have with your health care provider. Document  Released: 12/16/2005 Document Revised: 05/23/2016 Document Reviewed: 08/23/2013 Elsevier Interactive Patient Education  2017 Elsevier Inc.  

## 2017-07-28 ENCOUNTER — Encounter: Payer: Self-pay | Admitting: Radiation Oncology

## 2017-07-28 NOTE — Progress Notes (Addendum)
GI Location of Tumor / Histology: Metastatic Colon Cancer Recurrent   Yolanda Watson presented  months ago with symptoms of: bdominal pain severalmonths and palpable mass  Biopsies of  (if applicable) revealed:Diagnosis 04/29/2017:Soft Tissue Needle Core Biopsy, anterior abdominal wall ACELLULAR MUCIN DISSECTING FIBROUS STROMA   02/15/15 biopsy=Invasive adenocarcinoma    Past/Anticipated interventions by surgeon, if BSJ:GGEZM ca 2016 s/p colectomy and colostomy with subsequent [place] Past/Anticipated interventions by medical oncology, if any: Dr. Benay Spice, MD last note 07/24/17: Assessment/Plan: 1. Metastatic colon cancer presenting with an Abdominal/pelvic mass  2. Colon cancerdiagnosed in [place] in 2016-pathology report dated 02/15/2015-invasive adenocarcinoma, well-differentiated with prominent mucinous features; microscopic tumor extension into mesenteric adipose tissue and focally involving visceral peritoneum; radial and mucosal margins negative; lymph-vascular invasion withsuspicious foci identified;perineural invasion not identified; 16 negative lymph nodes; tumor deposits not identified; pT4apN0;MSI stable  ? CT 04/22/2017 revealed a heterogenous anterior pelvic wall mass, right inguinal mass, top normal sized retroperitoneal and iliac nodes, abutment of the anterior dome of the bladder and a loop of small bowel ? 04/29/2017 biopsy of anterior abdominal/pelvic wall mass-acellular mucin dissecting fibrous stroma ? Cycle 1 FOLFOX 05/15/2017 ? Cycle 2 FOLFOX 05/28/2017 ? Cycle 3 FOLFOX with Avastin on 06/11/2017 ? Cycle 4 FOLFOX/Avastin 06/25/2017 ? Cycle 5 FOLFOX/Avastin 07/10/2017, Neulasta added ? Restaging CT 07/21/2017-mild decrease in the size of a right inguinal node and the anterior abdominal wall mass  3. Ongoing tobacco and alcohol use, smokes 1/2ppd x 45 years , drinks 1-2 vodka  Glass daily (2 shots)  4. Mild neutropenia related to chemotherapy  07/10/2017. Neulasta added.referral to Dr. Barry Dienes to discuss resection of the mass,,referral for  Possible radiation   Weight changes, if any: No  Bowel/Bladder complaints, if any: regular bowels and bladder  Nausea / Vomiting, if any: none  Pain issues, if any:  Neck and b/l shoulder 8/10  Since last night, took aleve, and percocet, didn't help stated  Any blood per rectum:   No  SAFETY ISSUES: No  Prior radiation? No  Pacemaker/ICD? NO  Possible current pregnancy?  NO Is the patient on methotrexate? NO  Current Complaints/Details:   Married, 1 son, , alcohol abuse, smokes cigarettes,1/2 ppd x 45 years  no smokeless tobacco,   No family hx cancer  BP (!) 156/77   Pulse 78   Temp 98.3 F (36.8 C) (Oral)   Resp 18   Ht _0  (1.676 m)   Wt 116 lb 3.2 oz (52.7 kg)   SpO2 96% Comment: room air  BMI 18.76 kg/m   Wt Readings from Last 3 Encounters:  07/29/17 116 lb 3.2 oz (52.7 kg)  07/24/17 111 lb 11.2 oz (50.7 kg)  07/10/17 113 lb 1.6 oz (51.3 kg)   Allergies:PCNS

## 2017-07-29 ENCOUNTER — Ambulatory Visit
Admission: RE | Admit: 2017-07-29 | Discharge: 2017-07-29 | Disposition: A | Discharge status: Home / Self Care | Payer: Medicaid Other | Source: Ambulatory Visit | Attending: Radiation Oncology | Admitting: Radiation Oncology

## 2017-07-29 ENCOUNTER — Encounter: Payer: Self-pay | Admitting: Radiation Oncology

## 2017-07-29 VITALS — BP 156/77 | HR 78 | Temp 98.3°F | Resp 18 | Ht 66.0 in | Wt 116.2 lb

## 2017-07-29 DIAGNOSIS — C774 Secondary and unspecified malignant neoplasm of inguinal and lower limb lymph nodes: Secondary | ICD-10-CM | POA: Diagnosis not present

## 2017-07-29 DIAGNOSIS — Z880 Allergy status to penicillin: Secondary | ICD-10-CM | POA: Insufficient documentation

## 2017-07-29 DIAGNOSIS — Z79899 Other long term (current) drug therapy: Secondary | ICD-10-CM | POA: Insufficient documentation

## 2017-07-29 DIAGNOSIS — F17210 Nicotine dependence, cigarettes, uncomplicated: Secondary | ICD-10-CM | POA: Diagnosis not present

## 2017-07-29 DIAGNOSIS — Z510 Encounter for antineoplastic radiation therapy: Secondary | ICD-10-CM | POA: Insufficient documentation

## 2017-07-29 DIAGNOSIS — Z85038 Personal history of other malignant neoplasm of large intestine: Secondary | ICD-10-CM | POA: Insufficient documentation

## 2017-07-29 DIAGNOSIS — C772 Secondary and unspecified malignant neoplasm of intra-abdominal lymph nodes: Secondary | ICD-10-CM

## 2017-07-29 DIAGNOSIS — Z9049 Acquired absence of other specified parts of digestive tract: Secondary | ICD-10-CM | POA: Diagnosis not present

## 2017-07-29 DIAGNOSIS — C7989 Secondary malignant neoplasm of other specified sites: Secondary | ICD-10-CM | POA: Insufficient documentation

## 2017-07-29 DIAGNOSIS — C189 Malignant neoplasm of colon, unspecified: Secondary | ICD-10-CM

## 2017-07-29 DIAGNOSIS — Z9221 Personal history of antineoplastic chemotherapy: Secondary | ICD-10-CM | POA: Diagnosis not present

## 2017-07-29 DIAGNOSIS — C182 Malignant neoplasm of ascending colon: Secondary | ICD-10-CM

## 2017-07-29 HISTORY — DX: Malignant neoplasm of colon, unspecified: C18.9

## 2017-07-29 NOTE — Progress Notes (Signed)
Please see the Nurse Progress Note in the MD Initial Consult Encounter for this patient. 

## 2017-07-29 NOTE — Addendum Note (Signed)
Encounter addended by: Kyung Rudd, MD on: 07/29/2017  3:09 PM<BR>    Actions taken: Visit diagnoses modified, Problem List modified

## 2017-07-29 NOTE — Progress Notes (Signed)
Radiation Oncology         [phone] ________________________________  Name: Yolanda Watson MRN: [id number]  Date: 07/29/2017  DOB: 1951/01/11  VZ:DGLOVFI, No Pcp Per  Ladell Pier, MD     REFERRING PHYSICIAN: Ladell Pier, MD   DIAGNOSIS: The primary encounter diagnosis was Colon cancer metastasized to intra-abdominal lymph node ([place]). A diagnosis of Cancer of right colon [facility]) was also pertinent to this visit.   HISTORY OF PRESENT ILLNESS:Yolanda Watson is a 66 y.o. female who is seen  at the request of Dr. Benay Spice for a history of recurrent metastatic colon cancer. The patient has a history of adenocarcinoma of the  The patient underwent a colectomy and temporary colostomy in [place] in April 2016. Her pathology has been reviewed by Dr. Benay Spice but is not available for review in her chart. She did not receive chemotherapy or radiation at that time. She underwent colostomy take down as well. She relocated to [facility], and presented in April 2018 after noting fullness at the lower aspect of her incision line. A  CT on 04/22/2017 revealed a large heterogenous anterior pelvic wall mass, a smaller lesion in the right anterior inguinal area, rounded bilateral iliac chain lymph node measuring up to 84mm, a top-normal retroperitoneal nodes measuring approximately 9 mm in short axis is seen in the cavoatrial region. Biopsy on 04/29/2017 revealed the anterior abdominal/pelvic wall mass-acellular mucin dissecting fibrous stroma, concerning for a recurrence of her cancer. The patient completed five cycles of chemotherapy of FOLFOX from 05/15/2017 to 07/10/2017. A resating CT on 07/21/2017 revealed mild decrease in the size of the right inginal node and the anterior abdominal wall mass was perviously 9.3 x 12.4 x 11.3 cm and decreased to 10.5 x 13.3 x 11.5 cm. Dr.Sherill made a referral to Dr. Atha Starks to discuss resection of the mass, and comes today to discuss options of radiotherapy.   PREVIOUS  RADIATION THERAPY: No  Past Medical History:  Past Medical History:  Diagnosis Date  . Alcohol abuse   . Cancer ([place])   . Colon cancer ([place]) 2016    Past Surgical History: Past Surgical History:  Procedure Laterality Date  . ABDOMINAL SURGERY    . IR FLUORO GUIDE PORT INSERTION RIGHT  05/09/2017  . IR US GUIDE VASC ACCESS RIGHT  05/09/2017    Social History:  Social History   Social History  . Marital status: Married    Spouse name: N/A  . Number of children: N/A  . Years of education: N/A   Occupational History  . Not on file.   Social History Main Topics  . Smoking status: Current Every Day Smoker    Types: Cigarettes  . Smokeless tobacco: Never Used  . Alcohol use Not on file  . Drug use: Unknown  . Sexual activity: Not on file   Other Topics Concern  . Not on file   Social History Narrative  . No narrative on file    Family History: no known family history of malignancy  ALLERGIES: Penicillins   MEDICATIONS:  Current Outpatient Prescriptions  Medication Sig Dispense Refill  . naproxen sodium (ANAPROX) 220 MG tablet Take 220 mg by mouth 2 (two) times daily with a meal.    . oxyCODONE-acetaminophen (PERCOCET/ROXICET) 5-325 MG tablet Take 1-2 tablets by mouth every 6 (six) hours as needed for severe pain. 60 tablet 0  . lidocaine-prilocaine (EMLA) cream APPLY TO PORTACATH 1-2 HOURS PRIOR TO USE 30 g 2  . prochlorperazine (COMPAZINE) 10 MG  tablet Take 1 tablet (10 mg total) by mouth every 6 (six) hours as needed for nausea or vomiting. (Patient not taking: Reported on 07/29/2017) 30 tablet 1   No current facility-administered medications for this encounter.    Facility-Administered Medications Ordered in Other Encounters  Medication Dose Route Frequency Provider Last Rate Last Dose  . sodium chloride flush (NS) 0.9 % injection 10 mL  10 mL Intravenous PRN Ladell Pier, MD   10 mL at 07/24/17 0854     REVIEW OF SYSTEMS: On review of systems, the  patient reports that other than joint aches and pains since receiving neulasta, she is doing well overall. She reports that since receiving chemotherapy, she's noticed improvement in her abdominal wall mass. This is not as sore or tender. She denies drainage or opening of the skin around the tumor. She denies any chest pain, shortness of breath, cough, fevers, chills, night sweats, unintended weight changes. She denies any bowel or bladder disturbances, and denies abdominal pain, nausea or vomiting. She denies any new musculoskeletal or joint aches or pains, new skin lesions or concerns. She had an accident in her 19s when cleaning, she dropped a snow globe which ruptured and shards of glass severed several of the tendons in her wrists and hands. She continues to have contracture of her hands bilaterally as a result, but denies a history of RA. A complete review of systems is obtained and is otherwise negative.     PHYSICAL EXAM:  height is 5\' 6"  (1.676 m) and weight is 116 lb 3.2 oz (52.7 kg). Her oral temperature is 98.3 F (36.8 C). Her blood pressure is 156/77 (abnormal) and her pulse is 78. Her respiration is 18 and oxygen saturation is 96%.   In general this is a chronically ill and frail appearing external  in no acute distress. She is alert and oriented x4 and appropriate throughout the examination. HEENT reveals that the patient is normocephalic, atraumatic. EOMs are intact. PERRLA. Skin is intact without any evidence of gross lesions. Cardiovascular exam reveals a regular rate and rhythm, no clicks rubs or murmurs are auscultated. Chest is clear to auscultation bilaterally. Lymphatic assessment is performed and does not reveal any adenopathy in the cervical, supraclavicular, axillary, or inguinal chains. Abdomen has active bowel sounds in all quadrants and is intact but with a visibly large tumor at the inferior aspect of her midline laparotomy scar. The abdomen is otherwise soft, non tender, non  distended. Lower extremities are negative for pretibial pitting edema, deep calf tenderness, cyanosis or clubbing.     ECOG = 1  0 - Asymptomatic (Fully active, able to carry on all predisease activities without restriction)  1 - Symptomatic but completely ambulatory (Restricted in physically strenuous activity but ambulatory and able to carry out work of a light or sedentary nature. For example, light housework, office work)  2 - Symptomatic, <50% in bed during the day (Ambulatory and capable of all self care but unable to carry out any work activities. Up and about more than 50% of waking hours)  3 - Symptomatic, >50% in bed, but not bedbound (Capable of only limited self-care, confined to bed or chair 50% or more of waking hours)  4 - Bedbound (Completely disabled. Cannot carry on any self-care. Totally confined to bed or chair)  5 - Death   Eustace Pen MM, Creech RH, Tormey DC, et al. [phone]). "Toxicity and response criteria of the [facility]". [place] Oncol. 5 (6):  161-09   LABORATORY DATA:  Lab Results  Component Value Date   WBC 20.8 (H) 07/24/2017   HGB 12.5 07/24/2017   HCT 36.7 07/24/2017   MCV 97.6 07/24/2017   PLT 123 (L) 07/24/2017   Lab Results  Component Value Date   NA 137 07/24/2017   K 3.6 07/24/2017   CL 104 04/22/2017   CO2 22 07/24/2017   Lab Results  Component Value Date   ALT 19 07/24/2017   AST 23 07/24/2017   ALKPHOS 124 07/24/2017   BILITOT 0.33 07/24/2017      RADIOGRAPHY: Ct Abdomen Pelvis W Contrast  Result Date: 07/21/2017 CLINICAL DATA:  Colon cancer, status post colostomy with reversal, now with protruding abdominal growth, chemotherapy ongoing EXAM: CT ABDOMEN AND PELVIS WITH CONTRAST TECHNIQUE: Multidetector CT imaging of the abdomen and pelvis was performed using the standard protocol following bolus administration of intravenous contrast. CONTRAST:  80 mL Isovue 300 IV COMPARISON:  04/22/2017 FINDINGS: Lower  chest: Lung bases are clear. Hepatobiliary: Liver is notable for focal fat/ altered perfusion along the falciform ligament. No suspicious hepatic lesions. Calcified splenic granulomata. Gallbladder is unremarkable. No intrahepatic or extrahepatic ductal dilatation. Pancreas: Within normal limits. Spleen: Calcified splenic granulomata. Adrenals/Urinary Tract: Adrenal glands are within normal limits. 6 mm right upper pole renal cyst (series 7/image 15). Left kidney is within normal limits. No hydronephrosis. Bladder is within normal limits. Stomach/Bowel: Stomach is within normal limits. Status post right hemicolectomy with appendectomy. No evidence of bowel obstruction. Vascular/Lymphatic: No evidence of abdominal aortic aneurysm. Atherosclerotic calcifications of the abdominal aorta and branch vessels. 7 mm short axis portacaval node (series 2/ image 23), within normal limits. Small para-aortic nodes measuring up to 7 mm short axis, within normal limits. 1.5 cm short axis node in the right inguinal region (series 2/image 66), previously 2.0 cm. Reproductive: Uterus is within normal limits. Right ovary is within normal limits. 3.8 cm left ovarian dermoid. Other: No abdominopelvic ascites. 9.3 x 12.4 x 11.3 cm anterior abdominal wall metastasis, previously 10.5 x 13.3 x 11.5 cm when measured in a similar fashion. Musculoskeletal: Mild degenerative changes of the lumbar spine. IMPRESSION: 9.3 x 12.4 x 11.3 cm anterior abdominal wall metastasis, mildly decreased. Adjacent 1.5 cm short axis right inguinal nodal metastasis, mildly decreased. Status post right hemicolectomy. Electronically Signed   By: Julian Hy M.D.   On: 07/21/2017 10:19       IMPRESSION/PLAN: 1. Recurrent metastatic Stage IIB, pT4aN0 adenocarcinoma of the colon with disease in the anterior abdominal wall of the pelvis. Dr. Lisbeth Renshaw discusses the imaging findings and reviews her disease course. He discusses the options of multimodality therapy  including radiotherapy to shrink the tumor, and for discussion of possible resection with surgical oncology. The patient will be re-referred to Dr. Barry Dienes for her evaluation of this patient as well. Dr. Lisbeth Renshaw would offer a course of 3-4 weeks of radiotherapy.  We discussed the risks, benefits, short, and long term effects of radiotherapy, and the patient is interested in proceeding. Dr. Lisbeth Renshaw discusses the delivery and logistics of radiotherapy. Written consent is obtained and placed in the chart, a copy was provided to the patient. She will simulate in the next week or two. We appreciate Dr. Marlowe Aschoff recommendations as well.   The above documentation reflects my direct findings during this shared patient visit. Please see the separate note by Dr. Lisbeth Renshaw on this date for the remainder of the patient's plan of care.     Carola Rhine,  PAC    This document serves as a record of services personally performed by Shona Simpson, PA-C and Kyung Rudd, MD. It was created on their behalf by Valeta Harms, a trained medical scribe. The creation of this record is based on the scribe's personal observations and the providers' statements to them. This document has been checked and approved by the attending provider.

## 2017-08-07 ENCOUNTER — Other Ambulatory Visit (HOSPITAL_BASED_OUTPATIENT_CLINIC_OR_DEPARTMENT_OTHER): Payer: Medicaid Other

## 2017-08-07 ENCOUNTER — Ambulatory Visit: Payer: Self-pay

## 2017-08-07 ENCOUNTER — Ambulatory Visit (HOSPITAL_BASED_OUTPATIENT_CLINIC_OR_DEPARTMENT_OTHER): Payer: Medicaid Other | Admitting: Nurse Practitioner

## 2017-08-07 ENCOUNTER — Telehealth: Payer: Self-pay | Admitting: Nurse Practitioner

## 2017-08-07 VITALS — BP 155/71 | HR 87 | Temp 97.5°F | Resp 18 | Ht 66.0 in | Wt 113.0 lb

## 2017-08-07 DIAGNOSIS — C182 Malignant neoplasm of ascending colon: Secondary | ICD-10-CM

## 2017-08-07 DIAGNOSIS — D701 Agranulocytosis secondary to cancer chemotherapy: Secondary | ICD-10-CM

## 2017-08-07 DIAGNOSIS — C189 Malignant neoplasm of colon, unspecified: Secondary | ICD-10-CM

## 2017-08-07 DIAGNOSIS — Z452 Encounter for adjustment and management of vascular access device: Secondary | ICD-10-CM | POA: Diagnosis not present

## 2017-08-07 DIAGNOSIS — C7989 Secondary malignant neoplasm of other specified sites: Secondary | ICD-10-CM

## 2017-08-07 LAB — COMPREHENSIVE METABOLIC PANEL
ALBUMIN: 3.4 g/dL — AB (ref 3.5–5.0)
ALK PHOS: 138 U/L (ref 40–150)
ALT: 21 U/L (ref 0–55)
AST: 25 U/L (ref 5–34)
Anion Gap: 9 mEq/L (ref 3–11)
BILIRUBIN TOTAL: 0.49 mg/dL (ref 0.20–1.20)
BUN: 9.3 mg/dL (ref 7.0–26.0)
CO2: 25 meq/L (ref 22–29)
CREATININE: 0.7 mg/dL (ref 0.6–1.1)
Calcium: 9.4 mg/dL (ref 8.4–10.4)
Chloride: 104 mEq/L (ref 98–109)
EGFR: >90 mL/min/{1.73_m2} (ref >90)
GLUCOSE: 95 mg/dL (ref 70–140)
Potassium: 4.1 mEq/L (ref 3.5–5.1)
SODIUM: 138 meq/L (ref 136–145)
TOTAL PROTEIN: 7.3 g/dL (ref 6.4–8.3)

## 2017-08-07 LAB — CBC WITH DIFFERENTIAL/PLATELET
BASO%: 0.9 % (ref 0.0–2.0)
Basophils Absolute: 0.1 10*3/uL (ref 0.0–0.1)
EOS ABS: 0.2 10*3/uL (ref 0.0–0.5)
EOS%: 1.7 % (ref 0.0–7.0)
HCT: 37.2 % (ref 34.8–46.6)
HEMOGLOBIN: 12.4 g/dL (ref 11.6–15.9)
LYMPH%: 16.2 % (ref 14.0–49.7)
MCH: 33.6 pg (ref 25.1–34.0)
MCHC: 33.4 g/dL (ref 31.5–36.0)
MCV: 100.7 fL (ref 79.5–101.0)
MONO#: 0.6 10*3/uL (ref 0.1–0.9)
MONO%: 4.9 % (ref 0.0–14.0)
NEUT%: 76.3 % (ref 38.4–76.8)
NEUTROS ABS: 8.7 10*3/uL — AB (ref 1.5–6.5)
Platelets: 301 10*3/uL (ref 145–400)
RBC: 3.69 10*6/uL — ABNORMAL LOW (ref 3.70–5.45)
RDW: 18.5 % — AB (ref 11.2–14.5)
WBC: 11.4 10*3/uL — AB (ref 3.9–10.3)
lymph#: 1.8 10*3/uL (ref 0.9–3.3)

## 2017-08-07 MED ORDER — SODIUM CHLORIDE 0.9 % IJ SOLN
10.0000 mL | Freq: Once | INTRAMUSCULAR | Status: AC
Start: 2017-08-07 — End: 2017-08-07
  Administered 2017-08-07: 10 mL via INTRAVENOUS
  Filled 2017-08-07: qty 10

## 2017-08-07 MED ORDER — HEPARIN SOD (PORK) LOCK FLUSH 100 UNIT/ML IV SOLN
500.0000 [IU] | Freq: Once | INTRAVENOUS | Status: AC
  Administered 2017-08-07: 500 [IU] via INTRAVENOUS
  Filled 2017-08-07: qty 5

## 2017-08-07 MED ORDER — SODIUM CHLORIDE 0.9% FLUSH
10.0000 mL | INTRAVENOUS | Status: DC | PRN
  Administered 2017-08-07: 10 mL via INTRAVENOUS
  Filled 2017-08-07: qty 10

## 2017-08-07 NOTE — Progress Notes (Signed)
  [facility] OFFICE PROGRESS NOTE   Diagnosis:  Colon cancer  INTERVAL HISTORY:   Ms. Pixley returns as scheduled. She feels well. She has no pain. No nausea. No diarrhea. She has a good appetite.  Objective:  Vital signs in last 24 hours:  Blood pressure (!) 155/71, pulse 87, temperature (!) 97.5 F (36.4 C), temperature source Oral, resp. rate 18, height '5\' 6"'$  (1.676 m), weight 113 lb (51.3 kg), SpO2 98 %.    HEENT: No thrush or ulcers. Resp: Lungs clear bilaterally. Cardio: Regular rate and rhythm. GI: No hepatomegaly. Firm mass occupying the lower abdomen/pelvis. Vascular: No leg edema. Port-A-Cath without erythema.  Lab Results  Lab Results  Component Value Date   WBC 20.8 (H) 07/24/2017   HGB 12.5 07/24/2017   HCT 36.7 07/24/2017   MCV 97.6 07/24/2017   PLT 123 (L) 07/24/2017   NEUTROABS 17.2 (H) 07/24/2017    Imaging:  No results found.  Medications: I have reviewed the patient's current medications.  Assessment/Plan: 1. Metastatic colon cancer presenting with an Abdominal/pelvic mass  2. Colon cancerdiagnosed in [place] in 2016-pathology report dated 02/15/2015-invasive adenocarcinoma, well-differentiated with prominent mucinous features; microscopic tumor extension into mesenteric adipose tissue and focally involving visceral peritoneum; radial and mucosal margins negative; lymph-vascular invasion withsuspicious foci identified;perineural invasion not identified; 16 negative lymph nodes; tumor deposits not identified; pT4apN0;MSI stable  ? CT 04/22/2017 revealed a heterogenous anterior pelvic wall mass, right inguinal mass, top normal sized retroperitoneal and iliac nodes, abutment of the anterior dome of the bladder and a loop of small bowel ? 04/29/2017 biopsy of anterior abdominal/pelvic wall mass-acellular mucin dissecting fibrous stroma ? Cycle 1 FOLFOX 05/15/2017 ? Cycle 2 FOLFOX 05/28/2017 ? Cycle 3 FOLFOX with Avastin on  06/11/2017 ? Cycle 4 FOLFOX/Avastin 06/25/2017 ? Cycle 5 FOLFOX/Avastin 07/10/2017, Neulasta added ? Restaging CT 07/21/2017-mild decrease in the size of a right inguinal node and the anterior abdominal wall mass  3. Ongoing tobacco and alcohol use  4. Mild neutropenia related to chemotherapy 07/10/2017. Neulasta added.   Disposition: Yolanda Watson appear stable. She has completed 5 cycles of FOLFOX. She has seen Dr. Lisbeth Renshaw and Dr. Barry Dienes. The plan is for radiation followed by possible resection. She is scheduled for simulation 08/12/2017. A 3 week course of radiation is planned. We will see her back in 4-5 weeks to reevaluate. She will contact the office in the interim with any problems.  Plan reviewed with Dr. Benay Spice.    Ned Card ANP/GNP-BC   08/07/2017  9:11 AM

## 2017-08-07 NOTE — Telephone Encounter (Signed)
Scheduled appt per 8/9 los - Gave patient AVS and calender per los.  

## 2017-08-07 NOTE — Patient Instructions (Signed)

## 2017-08-12 ENCOUNTER — Ambulatory Visit
Admission: RE | Admit: 2017-08-12 | Discharge: 2017-08-12 | Disposition: A | Discharge status: Home / Self Care | Payer: Medicaid Other | Source: Ambulatory Visit | Attending: Radiation Oncology | Admitting: Radiation Oncology

## 2017-08-12 DIAGNOSIS — C774 Secondary and unspecified malignant neoplasm of inguinal and lower limb lymph nodes: Secondary | ICD-10-CM

## 2017-08-12 DIAGNOSIS — C182 Malignant neoplasm of ascending colon: Secondary | ICD-10-CM

## 2017-08-12 DIAGNOSIS — Z510 Encounter for antineoplastic radiation therapy: Secondary | ICD-10-CM | POA: Diagnosis not present

## 2017-08-14 DIAGNOSIS — Z510 Encounter for antineoplastic radiation therapy: Secondary | ICD-10-CM | POA: Diagnosis not present

## 2017-08-18 ENCOUNTER — Ambulatory Visit
Admission: RE | Admit: 2017-08-18 | Discharge: 2017-08-18 | Disposition: A | Discharge status: Home / Self Care | Payer: Medicaid Other | Source: Ambulatory Visit | Attending: Radiation Oncology | Admitting: Radiation Oncology

## 2017-08-18 DIAGNOSIS — Z510 Encounter for antineoplastic radiation therapy: Secondary | ICD-10-CM | POA: Diagnosis not present

## 2017-08-19 ENCOUNTER — Ambulatory Visit
Admission: RE | Admit: 2017-08-19 | Discharge: 2017-08-19 | Disposition: A | Discharge status: Home / Self Care | Payer: Medicaid Other | Source: Ambulatory Visit | Attending: Radiation Oncology | Admitting: Radiation Oncology

## 2017-08-19 DIAGNOSIS — Z510 Encounter for antineoplastic radiation therapy: Secondary | ICD-10-CM | POA: Diagnosis not present

## 2017-08-20 ENCOUNTER — Ambulatory Visit
Admission: RE | Admit: 2017-08-20 | Discharge: 2017-08-20 | Disposition: A | Discharge status: Home / Self Care | Payer: Medicaid Other | Source: Ambulatory Visit | Attending: Radiation Oncology | Admitting: Radiation Oncology

## 2017-08-20 ENCOUNTER — Encounter: Payer: Self-pay | Admitting: Oncology

## 2017-08-20 DIAGNOSIS — Z510 Encounter for antineoplastic radiation therapy: Secondary | ICD-10-CM | POA: Diagnosis not present

## 2017-08-20 NOTE — Progress Notes (Signed)
Patient's spouse came in to thank me and to advise she has been approved for Medicaid 04/29/17-10/29/17. Claims have been submitted from billing to Medicaid already. He was concerned what happens after 10/29/17. Advised him DSS will send them a re-certification letter to be completed and sent back before the date requested to determine continuing eligibility and they will not need to reapply. He thanked me again.

## 2017-08-21 ENCOUNTER — Ambulatory Visit
Admission: RE | Admit: 2017-08-21 | Discharge: 2017-08-21 | Disposition: A | Discharge status: Home / Self Care | Payer: Medicaid Other | Source: Ambulatory Visit | Attending: Radiation Oncology | Admitting: Radiation Oncology

## 2017-08-21 DIAGNOSIS — Z510 Encounter for antineoplastic radiation therapy: Secondary | ICD-10-CM | POA: Diagnosis not present

## 2017-08-22 ENCOUNTER — Ambulatory Visit
Admission: RE | Admit: 2017-08-22 | Discharge: 2017-08-22 | Disposition: A | Discharge status: Home / Self Care | Payer: Medicaid Other | Source: Ambulatory Visit | Attending: Radiation Oncology | Admitting: Radiation Oncology

## 2017-08-22 DIAGNOSIS — Z510 Encounter for antineoplastic radiation therapy: Secondary | ICD-10-CM | POA: Diagnosis not present

## 2017-08-22 NOTE — Progress Notes (Signed)
Pt here for patient teaching.  Pt given Radiation and You booklet and skin care instructions.  Reviewed areas of pertinence such as diarrhea, fatigue, nausea and vomiting, skin changes and urinary and bladder changes . Pt able to give teach back of use unscented/gentle soap, have Imodium on hand and drink plenty of water,to use an electric razor if they must shave. Pt verbalizes understanding of information given and will contact nursing with any questions or concerns.   Refused  Radaiplex. Http://rtanswers.org/treatmentinformation/whattoexpect/index

## 2017-08-22 NOTE — Progress Notes (Addendum)
  Radiation Oncology         [phone]) [phone] ________________________________  Name: Yolanda Watson MRN: [id number]  Date: 08/12/2017  DOB: [birth year]  SIMULATION AND TREATMENT PLANNING NOTE  DIAGNOSIS:     ICD-10-CM   1. Cancer of right colon ([facility]) C18.2   2. 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.  Identity was confirmed.  All relevant records and images related to the planned course of therapy were reviewed.   Written consent to proceed with treatment was confirmed which was freely given after reviewing the details related to the planned course of therapy had been reviewed with the patient.  Then, the patient was set-up in a stable reproducible  supine position for radiation therapy.  CT images were obtained.  Surface markings were placed.    Medically necessary complex treatment device(s) for immobilization:  Vac-lock bag.   The CT images were loaded into the planning software.  Then the target and avoidance structures were contoured.  Treatment planning then occurred.  The radiation prescription was entered and confirmed.  A total of 4 complex treatment devices were fabricated which relate to the designed radiation treatment fields. Each of these customized fields/ complex treatment devices will be used on a daily basis during the radiation course. I have requested : 3D Simulation  I have requested a DVH of the following structures: target volume, bowel, bladder.   The patient will undergo daily image guidance to ensure accurate localization of the target, and adequate minimize dose to the normal surrounding structures in close proximity to the target.   PLAN:  The patient will receive 30 Gy in 10 fractions.  ________________________________   Jodelle Gross, MD, PhD

## 2017-08-22 NOTE — Addendum Note (Signed)
Encounter addended by: Kyung Rudd, MD on: 08/22/2017  6:54 PM<BR>    Actions taken: Sign clinical note

## 2017-08-25 ENCOUNTER — Other Ambulatory Visit: Payer: Self-pay | Admitting: General Surgery

## 2017-08-25 ENCOUNTER — Ambulatory Visit
Admission: RE | Admit: 2017-08-25 | Discharge: 2017-08-25 | Disposition: A | Discharge status: Home / Self Care | Payer: Medicaid Other | Source: Ambulatory Visit | Attending: Radiation Oncology | Admitting: Radiation Oncology

## 2017-08-25 DIAGNOSIS — Z510 Encounter for antineoplastic radiation therapy: Secondary | ICD-10-CM | POA: Diagnosis not present

## 2017-08-26 ENCOUNTER — Ambulatory Visit
Admission: RE | Admit: 2017-08-26 | Discharge: 2017-08-26 | Disposition: A | Discharge status: Home / Self Care | Payer: Medicaid Other | Source: Ambulatory Visit | Attending: Radiation Oncology | Admitting: Radiation Oncology

## 2017-08-26 DIAGNOSIS — Z510 Encounter for antineoplastic radiation therapy: Secondary | ICD-10-CM | POA: Diagnosis not present

## 2017-08-27 ENCOUNTER — Ambulatory Visit
Admission: RE | Admit: 2017-08-27 | Discharge: 2017-08-27 | Disposition: A | Discharge status: Home / Self Care | Payer: Medicaid Other | Source: Ambulatory Visit | Attending: Radiation Oncology | Admitting: Radiation Oncology

## 2017-08-27 DIAGNOSIS — Z510 Encounter for antineoplastic radiation therapy: Secondary | ICD-10-CM | POA: Diagnosis not present

## 2017-08-28 ENCOUNTER — Ambulatory Visit
Admission: RE | Admit: 2017-08-28 | Discharge: 2017-08-28 | Disposition: A | Discharge status: Home / Self Care | Payer: Medicaid Other | Source: Ambulatory Visit | Attending: Radiation Oncology | Admitting: Radiation Oncology

## 2017-08-28 DIAGNOSIS — Z510 Encounter for antineoplastic radiation therapy: Secondary | ICD-10-CM | POA: Diagnosis not present

## 2017-08-29 ENCOUNTER — Ambulatory Visit: Payer: Medicaid Other

## 2017-08-29 ENCOUNTER — Ambulatory Visit
Admission: RE | Admit: 2017-08-29 | Discharge: 2017-08-29 | Disposition: A | Discharge status: Home / Self Care | Payer: Medicaid Other | Source: Ambulatory Visit | Attending: Radiation Oncology | Admitting: Radiation Oncology

## 2017-08-29 DIAGNOSIS — Z510 Encounter for antineoplastic radiation therapy: Secondary | ICD-10-CM | POA: Diagnosis not present

## 2017-09-02 ENCOUNTER — Ambulatory Visit: Payer: Medicaid Other

## 2017-09-03 ENCOUNTER — Ambulatory Visit: Admission: RE | Admit: 2017-09-03 | Payer: Medicaid Other | Source: Ambulatory Visit

## 2017-09-04 ENCOUNTER — Encounter: Payer: Self-pay | Admitting: Radiation Oncology

## 2017-09-04 NOTE — Progress Notes (Signed)
  Radiation Oncology         [phone] ________________________________  Name: Yolanda Watson MRN: [id number]  Date: 09/04/2017  DOB: November 16, 1951  End of Treatment Note  Diagnosis:   Malignant neoplasm of colon, unspecified  Indication for treatment: Palliative  Radiation treatment dates:   08/18/2017 - 08/29/2017  Site/dose:   Abdomen / 30 Gy in 10 fractions  Beams/energy:  3D/ 6X,10X  Narrative: The patient tolerated radiation treatment relatively well.    Plan: The patient has completed radiation treatment. The patient will return to radiation oncology clinic for routine followup in one month. I advised them to call or return sooner if they have any questions or concerns related to their recovery or treatment.  ------------------------------------------------  Jodelle Gross, MD, PhD  This document serves as a record of services personally performed by Kyung Rudd, MD. It was created on his behalf by Valeta Harms, a trained medical scribe. The creation of this record is based on the scribe's personal observations and the provider's statements to them. This document has been checked and approved by the attending provider.

## 2017-09-08 ENCOUNTER — Ambulatory Visit (HOSPITAL_BASED_OUTPATIENT_CLINIC_OR_DEPARTMENT_OTHER): Payer: Medicaid Other | Admitting: Oncology

## 2017-09-08 ENCOUNTER — Telehealth: Payer: Self-pay | Admitting: Oncology

## 2017-09-08 VITALS — BP 151/60 | HR 83 | Temp 98.1°F | Resp 17 | Ht 66.0 in | Wt 115.2 lb

## 2017-09-08 DIAGNOSIS — D701 Agranulocytosis secondary to cancer chemotherapy: Secondary | ICD-10-CM | POA: Diagnosis not present

## 2017-09-08 DIAGNOSIS — C189 Malignant neoplasm of colon, unspecified: Secondary | ICD-10-CM

## 2017-09-08 DIAGNOSIS — C182 Malignant neoplasm of ascending colon: Secondary | ICD-10-CM

## 2017-09-08 NOTE — Progress Notes (Signed)
  [name]   Diagnosis: Colon cancer  INTERVAL HISTORY:   Yolanda Watson returns as scheduled. She completed palliative radiation 09/03/2017. She no longer has pain associated with the abdominal wall mass. She continues smoking. She has seen Dr. Barry Dienes and [name]. Resection of the abdominal wall mass is planned for 10/03/2017.  Objective:  Vital signs in last 24 hours:  Blood pressure (!) 151/60, pulse 83, temperature 98.1 F (36.7 C), temperature source Oral, resp. rate 17, height _0  (1.676 m), weight 115 lb 3.2 oz (52.3 kg), SpO2 100 %.   Lymphatics: 1 cm node at the right inguinal region, inferior to the abdominal wall mass Resp: Clear bilaterally Cardio: Regular rate and rhythm GI: No hepatosplenomegaly, firm mass occupying the low abdomen extending to the pubis, the mass is mobile, fungating area anteriorly, no drainage Vascular: No leg edema   Portacath/PICC-without erythema  Lab Results:  Lab Results  Component Value Date   WBC 11.4 (H) 08/07/2017   HGB 12.4 08/07/2017   HCT 37.2 08/07/2017   MCV 100.7 08/07/2017   PLT 301 08/07/2017   NEUTROABS 8.7 (H) 08/07/2017    CMP     Component Value Date/Time   NA 138 08/07/2017 0835   K 4.1 08/07/2017 0835   CL 104 04/22/2017 1642   CO2 25 08/07/2017 0835   GLUCOSE 95 08/07/2017 0835   BUN 9.3 08/07/2017 0835   CREATININE 0.7 08/07/2017 0835   CALCIUM 9.4 08/07/2017 0835   PROT 7.3 08/07/2017 0835   ALBUMIN 3.4 (L) 08/07/2017 0835   AST 25 08/07/2017 0835   ALT 21 08/07/2017 0835   ALKPHOS 138 08/07/2017 0835   BILITOT 0.49 08/07/2017 0835   GFRNONAA >60 04/22/2017 1642   GFRAA >60 04/22/2017 1642    Lab Results  Component Value Date   CEA1 4.21 05/05/2017    Medications: I have reviewed the patient's current medications.  Assessment/Plan: 1. Metastatic colon cancer presenting with an Abdominal/pelvic mass  2. [place]-pathology report dated 02/15/2015-invasive adenocarcinoma, well-differentiated with prominent mucinous features; microscopic tumor extension into mesenteric adipose tissue and focally involving visceral peritoneum; radial and mucosal margins negative; lymph-vascular invasion withsuspicious foci identified;perineural invasion not identified; 16 negative lymph nodes; tumor deposits not identified; pT4apN0;MSI stable  ? CT 04/22/2017 revealed a heterogenous anterior pelvic wall mass, right inguinal mass, top normal sized retroperitoneal and iliac nodes, abutment of the anterior dome of the bladder and a loop of small bowel ? 04/29/2017 biopsy of anterior abdominal/pelvic wall mass-acellular mucin dissecting fibrous stroma ? Cycle 1 FOLFOX 05/15/2017 ? Cycle 2 FOLFOX 05/28/2017 ? Cycle 3 FOLFOX with Avastin on 06/11/2017 ? Cycle 4 FOLFOX/Avastin 06/25/2017 ? Cycle 5 FOLFOX/Avastin 07/10/2017, Neulasta added ? Restaging CT 07/21/2017-mild decrease in the size of a right inguinal node and the anterior abdominal wall mass ? Radiation to the abdominal wall mass 08/18/2017-09/03/2017  3. Ongoing tobacco and alcohol use  4. Mild neutropenia related to chemotherapy 07/10/2017. Neulasta added.   Disposition:  [name] appears unchanged. She will proceed with resection of the abdominal wall mass. I will see her following surgery to review the pathology report and discuss systemic treatment options.  I encouraged her to maximize her nutrition prior to surgery. She should discontinue smoking.  15 minutes were spent with the patient today. The majority of the time was used for counseling and coordination of care.  Donneta Romberg, MD  09/08/2017  10:50 AM

## 2017-09-08 NOTE — Telephone Encounter (Signed)
Gave patient AVS and calendar of upcoming October appointment.

## 2017-09-25 NOTE — Patient Instructions (Signed)
Yolanda Watson  09/25/2017   Your procedure is scheduled on: 10-03-17  Report to [facility] Main  Entrance Take [facility]  elevators to 3rd floor to  [place] at 530AM.   Call this number if you have problems the morning of surgery [phone]    Remember: ONLY 1 PERSON MAY GO WITH YOU TO SHORT STAY TO GET  READY MORNING OF [place].  Do not eat food or drink liquids :After Midnight.     Take these medicines the morning of surgery with A SIP OF WATER: none                                You may not have any metal on your body including hair pins and              piercings  Do not wear jewelry, make-up, lotions, powders or perfumes, deodorant             Do not wear nail polish.  Do not shave  48 hours prior to surgery.          Do not bring valuables to the hospital. [place].  Contacts, dentures or bridgework may not be worn into surgery.  Leave suitcase in the car. After surgery it may be brought to your room.                Please read over the following fact sheets you were given: _____________________________________________________________________           [facility] - Preparing for Surgery Before surgery, you can play an important role.  Because skin is not sterile, your skin needs to be as free of germs as possible.  You can reduce the number of germs on your skin by washing with CHG (chlorahexidine gluconate) soap before surgery.  CHG is an antiseptic cleaner which kills germs and bonds with the skin to continue killing germs even after washing. Please DO NOT use if you have an allergy to CHG or antibacterial soaps.  If your skin becomes reddened/irritated stop using the CHG and inform your nurse when you arrive at Short Stay. Do not shave (including legs and underarms) for at least 48 hours prior to the first CHG shower.  You may shave your face/neck. Please follow these instructions  carefully:  1.  Shower with CHG Soap the night before surgery and the  morning of Surgery.  2.  If you choose to wash your hair, wash your hair first as usual with your  normal  shampoo.  3.  After you shampoo, rinse your hair and body thoroughly to remove the  shampoo.                           4.  Use CHG as you would any other liquid soap.  You can apply chg directly  to the skin and wash                       Gently with a scrungie or clean washcloth.  5.  Apply the CHG Soap to your body ONLY FROM THE NECK DOWN.   Do not use on face/ open  Wound or open sores. Avoid contact with eyes, ears mouth and genitals (private parts).                       Wash face,  Genitals (private parts) with your normal soap.             6.  Wash thoroughly, paying special attention to the area where your surgery  will be performed.  7.  Thoroughly rinse your body with warm water from the neck down.  8.  DO NOT shower/wash with your normal soap after using and rinsing off  the CHG Soap.                9.  Pat yourself dry with a clean towel.            10.  Wear clean pajamas.            11.  Place clean sheets on your bed the night of your first shower and do not  sleep with pets. Day of Surgery : Do not apply any lotions/deodorants the morning of surgery.  Please wear clean clothes to the hospital/surgery center.  FAILURE TO FOLLOW THESE INSTRUCTIONS MAY RESULT IN THE CANCELLATION OF YOUR SURGERY PATIENT SIGNATURE_________________________________  NURSE SIGNATURE__________________________________  ________________________________________________________________________

## 2017-09-26 ENCOUNTER — Encounter (HOSPITAL_COMMUNITY): Payer: Self-pay

## 2017-09-26 ENCOUNTER — Encounter (HOSPITAL_COMMUNITY)
Admission: RE | Admit: 2017-09-26 | Discharge: 2017-09-26 | Disposition: A | Discharge status: Home / Self Care | Payer: Medicaid Other | Source: Ambulatory Visit | Attending: General Surgery | Admitting: General Surgery

## 2017-09-26 DIAGNOSIS — C189 Malignant neoplasm of colon, unspecified: Secondary | ICD-10-CM | POA: Insufficient documentation

## 2017-09-26 DIAGNOSIS — Z0183 Encounter for blood typing: Secondary | ICD-10-CM | POA: Diagnosis not present

## 2017-09-26 DIAGNOSIS — Z01812 Encounter for preprocedural laboratory examination: Secondary | ICD-10-CM | POA: Diagnosis present

## 2017-09-26 HISTORY — DX: Presence of other vascular implants and grafts: Z95.828

## 2017-09-26 HISTORY — DX: Unspecified acquired deformity of hand, right hand: M21.941

## 2017-09-26 HISTORY — DX: Unspecified acquired deformity of hand, left hand: M21.942

## 2017-09-26 LAB — CBC
HCT: 42.8 % (ref 36.0–46.0)
Hemoglobin: 14 g/dL (ref 12.0–15.0)
MCH: 32.6 pg (ref 26.0–34.0)
MCHC: 32.7 g/dL (ref 30.0–36.0)
MCV: 99.8 fL (ref 78.0–100.0)
PLATELETS: 197 10*3/uL (ref 150–400)
RBC: 4.29 MIL/uL (ref 3.87–5.11)
RDW: 13.2 % (ref 11.5–15.5)
WBC: 5.1 10*3/uL (ref 4.0–10.5)

## 2017-09-26 LAB — URINALYSIS, ROUTINE W REFLEX MICROSCOPIC
BILIRUBIN URINE: NEGATIVE
Glucose, UA: NEGATIVE mg/dL
Hgb urine dipstick: NEGATIVE
Ketones, ur: 5 mg/dL — AB
LEUKOCYTES UA: NEGATIVE
NITRITE: NEGATIVE
PH: 5 (ref 5.0–8.0)
Protein, ur: NEGATIVE mg/dL
SPECIFIC GRAVITY, URINE: 1.03 (ref 1.005–1.030)

## 2017-09-26 LAB — ABO/RH: ABO/RH(D): B POS

## 2017-09-26 LAB — PROTIME-INR
INR: 0.9
PROTHROMBIN TIME: 12 s (ref 11.4–15.2)

## 2017-09-26 NOTE — Progress Notes (Signed)
UA result routed via epic to dr Dorris Fetch byerly

## 2017-10-01 NOTE — H&P (Signed)
Yolanda Watson  Location: [facility] Patient #: [id number] DOB: [birth year] Married / Language: English / Race: White Female   History of Present Illness The patient is a 66 year old female who presents for a follow-up for Colorectal cancer. Patient is a 66 year old female of Dr. Gearldine Shown who has metastatic colon cancer to the abdominal wall. The patient was diagnosed with colon cancer at [facility] in 2016. She underwent a right hemicolectomy by [name]. Final pathology was pT4aN0 MSI stable. She did not receive chemotherapy. She started feeling pain and a bulge in her lower abdominal wall in April 2018. Imaging was performed which showed pelvic wall mass. This was biopsied and showed mucinous stroma. She has received 5 cycles of FOLFOX, three of which were with Avastin. She has had noticeable shrinkage and the tumor by her own perception as well as by imaging. She also is not having any pain any longer at this point. Her main complaint is the musculoskeletal pain following injection of a granulocyte stimulating hormone.  She has subsequently started palliative radiation with Dr. Lisbeth Renshaw. She is to receive 5 cycles. She is quitting smoking and has gained some weight on purpuse. She is up 1-2 pounds.     CT 07/21/2017 IMPRESSION: 9.3 x 12.4 x 11.3 cm anterior abdominal wall metastasis, mildly decreased.  Adjacent 1.5 cm short axis right inguinal nodal metastasis, mildly decreased.  Status post right hemicolectomy.    Allergies  PENICILLIN G   Medication History Oxycodone-Acetaminophen (5-'325MG'$  Tablet, Oral) Active. Lidocaine-Prilocaine (2.5-2.5% Cream, External) Active. Prochlorperazine Maleate ('10MG'$  Tablet, Oral) Active. Medications Reconciled    Review of Systems All other systems negative  Vitals  Weight: 114.38 lb Height: 66in Body Surface Area: 1.58 m Body Mass Index: 18.46 kg/m  Temp.: 98.57F(Oral)  Pulse: 89 (Regular)   BP: 118/78 (Sitting, Left Arm, Standard)       Physical Exam  General Mental Status-Alert. General Appearance-Consistent with stated age. Hydration-Well hydrated. Voice-Normal.  Head and Neck Head-normocephalic, atraumatic with no lesions or palpable masses.  Eye Sclera/Conjunctiva - Bilateral-No scleral icterus.  Chest and Lung Exam Chest and lung exam reveals -quiet, even and easy respiratory effort with no use of accessory muscles. Inspection Chest Wall - Normal. Back - normal.  Breast - Did not examine.  Cardiovascular Cardiovascular examination reveals -normal pedal pulses bilaterally. Note: regular rate and rhythm  Abdomen Inspection-Inspection Normal. Palpation/Percussion Palpation and Percussion of the abdomen reveal - Soft, Non Tender, No Rebound tenderness, No Rigidity (guarding) and No hepatosplenomegaly. Note: mass in lower abdominal wall. Fixed to pubis. 10-15 cm. upper central skin involved around 2-3 cm. The more lateral skin is loose and unattached.   Peripheral Vascular Upper Extremity Inspection - Bilateral - Normal - No Clubbing, No Cyanosis, No Edema, Pulses Intact. Lower Extremity Palpation - Edema - Bilateral - No edema.  Neurologic Neurologic evaluation reveals -alert and oriented x 3 with no impairment of recent or remote memory. Mental Status-Normal.  Musculoskeletal Global Assessment -Note: no gross deformities.  Normal Exam - Left-Upper Extremity Strength Normal and Lower Extremity Strength Normal. Normal Exam - Right-Upper Extremity Strength Normal and Lower Extremity Strength Normal.  Lymphatic Head & Neck  General Head & Neck Lymphatics: Bilateral - Description - Normal. Axillary  General Axillary Region: Bilateral - Description - Normal. Tenderness - Non Tender.    Assessment & Plan MALIGNANT NEOPLASM OF ABDOMINAL WALL (C44.509) Impression: Pt has seen [name]. We will plan radical  excision of this tumor. [name] will  plan to reconstruct with mesh. There should be adequate skin for skin coverage. I discussed the importance to the patient to continue smoking cessation. I also reviewed the nutritional importance of protein status and weight gain on helping her heel. I discussed the long-term weakness of her abdominal wall.  I reviewed the risks of the procedure including bleeding, infection, damage to adjacent structures, possible unexpected need to resect bowel, possible tumor recurrence, possible hernia, possible fistula, possible heart or lung complications or blood clots. She understands and wishes to proceed. I would get physical therapy involved right away with the patient as she has significant issues with her hands and will need to work with her hand weakness to have better mobility.  I'll communicate with the rest of her treatment team about the appropriate timing after radiation. We will get this set up at the first appropriate opportunity. Current Plans Schedule for Surgery Pt Education - [facility] Open [facility]dominal [facility]   Signed by Stark Klein, MD

## 2017-10-02 ENCOUNTER — Other Ambulatory Visit (HOSPITAL_COMMUNITY): Payer: Self-pay | Admitting: *Deleted

## 2017-10-02 NOTE — Progress Notes (Deleted)
Medical clearance dr Leighton Ruff on chart for 10-06-17 surgery ekg 09-02-17 dr Leighton Ruff on chart

## 2017-10-03 ENCOUNTER — Inpatient Hospital Stay (HOSPITAL_COMMUNITY): Payer: Medicaid Other | Admitting: Anesthesiology

## 2017-10-03 ENCOUNTER — Encounter (HOSPITAL_COMMUNITY)
Admission: RE | Disposition: A | Discharge status: Home / Self Care | Payer: Self-pay | Source: Ambulatory Visit | Attending: General Surgery

## 2017-10-03 ENCOUNTER — Inpatient Hospital Stay (HOSPITAL_COMMUNITY)
Admission: RE | Admit: 2017-10-03 | Discharge: 2017-10-08 | DRG: 988 | Disposition: A | Discharge status: Home / Self Care | Payer: Medicaid Other | Source: Ambulatory Visit | Attending: General Surgery | Admitting: General Surgery

## 2017-10-03 ENCOUNTER — Encounter (HOSPITAL_COMMUNITY): Payer: Self-pay | Admitting: *Deleted

## 2017-10-03 DIAGNOSIS — R636 Underweight: Secondary | ICD-10-CM | POA: Diagnosis present

## 2017-10-03 DIAGNOSIS — F1011 Alcohol abuse, in remission: Secondary | ICD-10-CM | POA: Diagnosis present

## 2017-10-03 DIAGNOSIS — Z923 Personal history of irradiation: Secondary | ICD-10-CM | POA: Diagnosis not present

## 2017-10-03 DIAGNOSIS — C792 Secondary malignant neoplasm of skin: Secondary | ICD-10-CM | POA: Diagnosis present

## 2017-10-03 DIAGNOSIS — Z9221 Personal history of antineoplastic chemotherapy: Secondary | ICD-10-CM

## 2017-10-03 DIAGNOSIS — Z681 Body mass index (BMI) 19 or less, adult: Secondary | ICD-10-CM | POA: Diagnosis not present

## 2017-10-03 DIAGNOSIS — F17210 Nicotine dependence, cigarettes, uncomplicated: Secondary | ICD-10-CM | POA: Diagnosis present

## 2017-10-03 DIAGNOSIS — Z880 Allergy status to penicillin: Secondary | ICD-10-CM | POA: Diagnosis not present

## 2017-10-03 DIAGNOSIS — C774 Secondary and unspecified malignant neoplasm of inguinal and lower limb lymph nodes: Secondary | ICD-10-CM | POA: Diagnosis present

## 2017-10-03 DIAGNOSIS — C19 Malignant neoplasm of rectosigmoid junction: Secondary | ICD-10-CM | POA: Diagnosis present

## 2017-10-03 DIAGNOSIS — C189 Malignant neoplasm of colon, unspecified: Secondary | ICD-10-CM | POA: Diagnosis present

## 2017-10-03 DIAGNOSIS — Z79899 Other long term (current) drug therapy: Secondary | ICD-10-CM

## 2017-10-03 HISTORY — PX: ABDOMINOPLASTY: SHX5355

## 2017-10-03 HISTORY — PX: EXCISION OF ABDOMINAL WALL TUMOR: SHX6687

## 2017-10-03 LAB — TYPE AND SCREEN
ABO/RH(D): B POS
ANTIBODY SCREEN: NEGATIVE

## 2017-10-03 LAB — CBC
HEMATOCRIT: 36.8 % (ref 36.0–46.0)
HEMOGLOBIN: 12 g/dL (ref 12.0–15.0)
MCH: 32.8 pg (ref 26.0–34.0)
MCHC: 32.6 g/dL (ref 30.0–36.0)
MCV: 100.5 fL — ABNORMAL HIGH (ref 78.0–100.0)
Platelets: 148 10*3/uL — ABNORMAL LOW (ref 150–400)
RBC: 3.66 MIL/uL — ABNORMAL LOW (ref 3.87–5.11)
RDW: 13.3 % (ref 11.5–15.5)
WBC: 7.8 10*3/uL (ref 4.0–10.5)

## 2017-10-03 LAB — CREATININE, SERUM: CREATININE: 0.49 mg/dL (ref 0.44–1.00)

## 2017-10-03 SURGERY — EXCISION, NEOPLASM, ABDOMINAL WALL
Anesthesia: General

## 2017-10-03 MED ORDER — CHLORHEXIDINE GLUCONATE CLOTH 2 % EX PADS: 6.0000 | MEDICATED_PAD | Freq: Once | CUTANEOUS | Status: DC

## 2017-10-03 MED ORDER — METHOCARBAMOL 500 MG PO TABS: 500.0000 mg | ORAL_TABLET | Freq: Four times a day (QID) | ORAL | Status: DC | PRN

## 2017-10-03 MED ORDER — SIMETHICONE 80 MG PO CHEW: 40.0000 mg | CHEWABLE_TABLET | Freq: Four times a day (QID) | ORAL | Status: DC | PRN

## 2017-10-03 MED ORDER — NALOXONE HCL 0.4 MG/ML IJ SOLN
0.4000 mg | INTRAMUSCULAR | Status: DC | PRN
  Administered 2017-10-03: 0.4 mg via INTRAVENOUS
  Filled 2017-10-03: qty 1

## 2017-10-03 MED ORDER — SUGAMMADEX SODIUM 200 MG/2ML IV SOLN
INTRAVENOUS | Status: DC | PRN
  Administered 2017-10-03: 100 mg via INTRAVENOUS

## 2017-10-03 MED ORDER — ONDANSETRON 4 MG PO TBDP: 4.0000 mg | ORAL_TABLET | Freq: Four times a day (QID) | ORAL | Status: DC | PRN

## 2017-10-03 MED ORDER — BUPIVACAINE-EPINEPHRINE (PF) 0.25% -1:200000 IJ SOLN
INTRAMUSCULAR | Status: AC
  Filled 2017-10-03: qty 30

## 2017-10-03 MED ORDER — DIPHENHYDRAMINE HCL 50 MG/ML IJ SOLN: 12.5000 mg | Freq: Four times a day (QID) | INTRAMUSCULAR | Status: DC | PRN

## 2017-10-03 MED ORDER — ZOLPIDEM TARTRATE 5 MG PO TABS: 5.0000 mg | ORAL_TABLET | Freq: Every evening | ORAL | Status: DC | PRN

## 2017-10-03 MED ORDER — EPHEDRINE SULFATE-NACL 50-0.9 MG/10ML-% IV SOSY
PREFILLED_SYRINGE | INTRAVENOUS | Status: DC | PRN
  Administered 2017-10-03: 10 mg via INTRAVENOUS
  Administered 2017-10-03: 5 mg via INTRAVENOUS

## 2017-10-03 MED ORDER — FENTANYL CITRATE (PF) 250 MCG/5ML IJ SOLN
INTRAMUSCULAR | Status: AC
  Filled 2017-10-03: qty 5

## 2017-10-03 MED ORDER — ONDANSETRON HCL 4 MG/2ML IJ SOLN: 4.0000 mg | Freq: Once | INTRAMUSCULAR | Status: DC | PRN

## 2017-10-03 MED ORDER — SODIUM CHLORIDE 0.9% FLUSH: 9.0000 mL | INTRAVENOUS | Status: DC | PRN

## 2017-10-03 MED ORDER — ENOXAPARIN SODIUM 40 MG/0.4ML ~~LOC~~ SOLN
40.0000 mg | SUBCUTANEOUS | Status: DC
Start: 2017-10-04 — End: 2017-10-08
  Administered 2017-10-04 – 2017-10-08 (×5): 40 mg via SUBCUTANEOUS
  Filled 2017-10-03 (×6): qty 0.4

## 2017-10-03 MED ORDER — BISACODYL 10 MG RE SUPP: 10.0000 mg | Freq: Every day | RECTAL | Status: DC | PRN

## 2017-10-03 MED ORDER — CIPROFLOXACIN IN D5W 400 MG/200ML IV SOLN
400.0000 mg | Freq: Two times a day (BID) | INTRAVENOUS | Status: AC
  Administered 2017-10-03: 400 mg via INTRAVENOUS
  Filled 2017-10-03: qty 200

## 2017-10-03 MED ORDER — 0.9 % SODIUM CHLORIDE (POUR BTL) OPTIME
TOPICAL | Status: DC | PRN
  Administered 2017-10-03: 2000 mL

## 2017-10-03 MED ORDER — STERILE WATER FOR IRRIGATION IR SOLN
Status: DC | PRN
  Administered 2017-10-03: 1000 mL

## 2017-10-03 MED ORDER — GABAPENTIN 300 MG PO CAPS
300.0000 mg | ORAL_CAPSULE | Freq: Two times a day (BID) | ORAL | Status: DC
  Administered 2017-10-03 – 2017-10-08 (×10): 300 mg via ORAL
  Filled 2017-10-03 (×10): qty 1

## 2017-10-03 MED ORDER — ACETAMINOPHEN 650 MG RE SUPP
650.0000 mg | Freq: Four times a day (QID) | RECTAL | Status: DC | PRN
  Filled 2017-10-03: qty 1

## 2017-10-03 MED ORDER — METOPROLOL TARTRATE 5 MG/5ML IV SOLN: 5.0000 mg | Freq: Four times a day (QID) | INTRAVENOUS | Status: DC | PRN

## 2017-10-03 MED ORDER — ACETAMINOPHEN 500 MG PO TABS
1000.0000 mg | ORAL_TABLET | ORAL | Status: AC
  Administered 2017-10-03: 1000 mg via ORAL
  Filled 2017-10-03: qty 2

## 2017-10-03 MED ORDER — MIDAZOLAM HCL 2 MG/2ML IJ SOLN
INTRAMUSCULAR | Status: AC
  Filled 2017-10-03: qty 2

## 2017-10-03 MED ORDER — HYDROMORPHONE HCL-NACL 0.5-0.9 MG/ML-% IV SOSY
0.2500 mg | PREFILLED_SYRINGE | INTRAVENOUS | Status: DC | PRN
  Administered 2017-10-03: 0.5 mg via INTRAVENOUS

## 2017-10-03 MED ORDER — ROCURONIUM BROMIDE 10 MG/ML (PF) SYRINGE
PREFILLED_SYRINGE | INTRAVENOUS | Status: DC | PRN
  Administered 2017-10-03: 40 mg via INTRAVENOUS
  Administered 2017-10-03 (×2): 10 mg via INTRAVENOUS

## 2017-10-03 MED ORDER — PHENYLEPHRINE 40 MCG/ML (10ML) SYRINGE FOR IV PUSH (FOR BLOOD PRESSURE SUPPORT)
PREFILLED_SYRINGE | INTRAVENOUS | Status: AC
  Filled 2017-10-03: qty 10

## 2017-10-03 MED ORDER — PROPOFOL 10 MG/ML IV BOLUS
INTRAVENOUS | Status: DC | PRN
  Administered 2017-10-03: 80 mg via INTRAVENOUS

## 2017-10-03 MED ORDER — ACETAMINOPHEN 325 MG PO TABS: 650.0000 mg | ORAL_TABLET | Freq: Four times a day (QID) | ORAL | Status: DC | PRN

## 2017-10-03 MED ORDER — LIDOCAINE 2% (20 MG/ML) 5 ML SYRINGE
INTRAMUSCULAR | Status: DC | PRN
  Administered 2017-10-03: 50 mg via INTRAVENOUS

## 2017-10-03 MED ORDER — MIDAZOLAM HCL 5 MG/5ML IJ SOLN
INTRAMUSCULAR | Status: DC | PRN
  Administered 2017-10-03: 2 mg via INTRAVENOUS

## 2017-10-03 MED ORDER — ONDANSETRON HCL 4 MG/2ML IJ SOLN
4.0000 mg | Freq: Four times a day (QID) | INTRAMUSCULAR | Status: DC | PRN
Start: 2017-10-03 — End: 2017-10-08

## 2017-10-03 MED ORDER — FENTANYL CITRATE (PF) 100 MCG/2ML IJ SOLN
INTRAMUSCULAR | Status: AC
  Filled 2017-10-03: qty 2

## 2017-10-03 MED ORDER — DIPHENHYDRAMINE HCL 12.5 MG/5ML PO ELIX
12.5000 mg | ORAL_SOLUTION | Freq: Four times a day (QID) | ORAL | Status: DC | PRN
  Filled 2017-10-03: qty 5

## 2017-10-03 MED ORDER — ONDANSETRON HCL 4 MG/2ML IJ SOLN
INTRAMUSCULAR | Status: AC
  Filled 2017-10-03: qty 2

## 2017-10-03 MED ORDER — MORPHINE SULFATE 2 MG/ML IV SOLN
INTRAVENOUS | Status: DC
  Administered 2017-10-03: 25.5 mg via INTRAVENOUS
  Administered 2017-10-03: 28.5 mg via INTRAVENOUS
  Administered 2017-10-03: 16.5 mg via INTRAVENOUS
  Administered 2017-10-03 (×2): via INTRAVENOUS
  Administered 2017-10-04: 6 mg via INTRAVENOUS
  Administered 2017-10-04: 3 mg via INTRAVENOUS
  Administered 2017-10-04: 10.1 mg via INTRAVENOUS
  Administered 2017-10-04: 4.5 mg via INTRAVENOUS
  Administered 2017-10-05 (×2): 3 mg via INTRAVENOUS
  Administered 2017-10-05: 6 mg via INTRAVENOUS
  Administered 2017-10-05: 23:00:00 via INTRAVENOUS
  Administered 2017-10-06: 1.5 mg via INTRAVENOUS
  Filled 2017-10-03 (×3): qty 30

## 2017-10-03 MED ORDER — CIPROFLOXACIN IN D5W 400 MG/200ML IV SOLN
INTRAVENOUS | Status: AC
  Filled 2017-10-03: qty 200

## 2017-10-03 MED ORDER — HYDROMORPHONE HCL-NACL 0.5-0.9 MG/ML-% IV SOSY
PREFILLED_SYRINGE | INTRAVENOUS | Status: AC
Start: 2017-10-03 — End: 2017-10-03
  Filled 2017-10-03: qty 3

## 2017-10-03 MED ORDER — PANTOPRAZOLE SODIUM 40 MG IV SOLR
40.0000 mg | Freq: Every day | INTRAVENOUS | Status: DC
  Administered 2017-10-03: 40 mg via INTRAVENOUS
  Filled 2017-10-03: qty 40

## 2017-10-03 MED ORDER — ONDANSETRON HCL 4 MG/2ML IJ SOLN: 4.0000 mg | Freq: Four times a day (QID) | INTRAMUSCULAR | Status: DC | PRN

## 2017-10-03 MED ORDER — CIPROFLOXACIN IN D5W 400 MG/200ML IV SOLN
400.0000 mg | INTRAVENOUS | Status: AC
  Administered 2017-10-03: 400 mg via INTRAVENOUS

## 2017-10-03 MED ORDER — PHENYLEPHRINE 40 MCG/ML (10ML) SYRINGE FOR IV PUSH (FOR BLOOD PRESSURE SUPPORT)
PREFILLED_SYRINGE | INTRAVENOUS | Status: DC | PRN
  Administered 2017-10-03 (×3): 40 ug via INTRAVENOUS
  Administered 2017-10-03 (×3): 80 ug via INTRAVENOUS

## 2017-10-03 MED ORDER — FENTANYL CITRATE (PF) 100 MCG/2ML IJ SOLN
INTRAMUSCULAR | Status: DC | PRN
  Administered 2017-10-03 (×6): 50 ug via INTRAVENOUS

## 2017-10-03 MED ORDER — DIPHENHYDRAMINE HCL 12.5 MG/5ML PO ELIX: 12.5000 mg | ORAL_SOLUTION | Freq: Four times a day (QID) | ORAL | Status: DC | PRN

## 2017-10-03 MED ORDER — OXYCODONE HCL 5 MG/5ML PO SOLN
5.0000 mg | Freq: Once | ORAL | Status: DC | PRN
  Filled 2017-10-03: qty 5

## 2017-10-03 MED ORDER — SENNA 8.6 MG PO TABS
1.0000 | ORAL_TABLET | Freq: Two times a day (BID) | ORAL | Status: DC
  Administered 2017-10-04 – 2017-10-08 (×9): 8.6 mg via ORAL
  Filled 2017-10-03 (×10): qty 1

## 2017-10-03 MED ORDER — GABAPENTIN 300 MG PO CAPS
300.0000 mg | ORAL_CAPSULE | ORAL | Status: AC
  Administered 2017-10-03: 300 mg via ORAL
  Filled 2017-10-03: qty 1

## 2017-10-03 MED ORDER — PROPOFOL 10 MG/ML IV BOLUS
INTRAVENOUS | Status: AC
  Filled 2017-10-03: qty 20

## 2017-10-03 MED ORDER — LACTATED RINGERS IV SOLN
INTRAVENOUS | Status: DC | PRN
  Administered 2017-10-03 (×3): via INTRAVENOUS

## 2017-10-03 MED ORDER — KCL IN DEXTROSE-NACL 20-5-0.45 MEQ/L-%-% IV SOLN
INTRAVENOUS | Status: DC
  Administered 2017-10-03 – 2017-10-06 (×4): via INTRAVENOUS
  Filled 2017-10-03 (×6): qty 1000

## 2017-10-03 MED ORDER — OXYCODONE HCL 5 MG PO TABS: 5.0000 mg | ORAL_TABLET | Freq: Once | ORAL | Status: DC | PRN

## 2017-10-03 MED ORDER — MORPHINE SULFATE (PF) 4 MG/ML IV SOLN: 0.5000 mg | INTRAVENOUS | Status: DC | PRN

## 2017-10-03 MED ORDER — DEXAMETHASONE SODIUM PHOSPHATE 10 MG/ML IJ SOLN
INTRAMUSCULAR | Status: DC | PRN
  Administered 2017-10-03: 10 mg via INTRAVENOUS

## 2017-10-03 SURGICAL SUPPLY — 59 items
BLADE EXTENDED COATED 6.5IN (ELECTRODE) ×2 IMPLANT
BLADE HEX COATED 2.75 (ELECTRODE) ×4 IMPLANT
CATH KIT ON-Q SILVERSOAK 7.5IN (CATHETERS) IMPLANT
CELLS DAT CNTRL 66122 CELL SVR (MISCELLANEOUS) IMPLANT
CLIP TI MEDIUM 6 (CLIP) ×6 IMPLANT
COUNTER NEEDLE 20 DBL MAG RED (NEEDLE) ×2 IMPLANT
COVER SURGICAL LIGHT HANDLE (MISCELLANEOUS) ×2 IMPLANT
DECANTER SPIKE VIAL GLASS SM (MISCELLANEOUS) ×2 IMPLANT
DRAIN CHANNEL 19F RND (DRAIN) ×2 IMPLANT
DRAPE LAPAROSCOPIC ABDOMINAL (DRAPES) ×2 IMPLANT
DRAPE UTILITY XL STRL (DRAPES) ×4 IMPLANT
DRSG OPSITE POSTOP 4X10 (GAUZE/BANDAGES/DRESSINGS) IMPLANT
DRSG OPSITE POSTOP 4X6 (GAUZE/BANDAGES/DRESSINGS) IMPLANT
DRSG OPSITE POSTOP 4X8 (GAUZE/BANDAGES/DRESSINGS) IMPLANT
DRSG TEGADERM 4X4.75 (GAUZE/BANDAGES/DRESSINGS) IMPLANT
ELECT REM PT RETURN 15FT ADLT (MISCELLANEOUS) ×2 IMPLANT
ENDOLOOP SUT PDS II  0 18 (SUTURE)
ENDOLOOP SUT PDS II 0 18 (SUTURE) IMPLANT
EVACUATOR SILICONE 100CC (DRAIN) ×2 IMPLANT
GAUZE SPONGE 4X4 12PLY STRL (GAUZE/BANDAGES/DRESSINGS) ×2 IMPLANT
GLOVE BIO SURGEON STRL SZ 6 (GLOVE) ×4 IMPLANT
GLOVE INDICATOR 6.5 STRL GRN (GLOVE) ×4 IMPLANT
GOWN STRL REUS W/ TWL XL LVL3 (GOWN DISPOSABLE) ×3 IMPLANT
GOWN STRL REUS W/TWL XL LVL3 (GOWN DISPOSABLE) ×3
LEGGING LITHOTOMY PAIR STRL (DRAPES) ×2 IMPLANT
LUBRICANT JELLY K Y 4OZ (MISCELLANEOUS) IMPLANT
PACK COLON (CUSTOM PROCEDURE TRAY) ×2 IMPLANT
PACK GENERAL/GYN (CUSTOM PROCEDURE TRAY) ×2 IMPLANT
RTRCTR WOUND ALEXIS 18CM MED (MISCELLANEOUS)
SCISSORS LAP 5X35 DISP (ENDOMECHANICALS) ×2 IMPLANT
SLEEVE SURGEON STRL (DRAPES) IMPLANT
SPONGE LAP 18X18 X RAY DECT (DISPOSABLE) ×2 IMPLANT
STAPLER VISISTAT 35W (STAPLE) ×4 IMPLANT
SUT ETHILON 2 0 PS N (SUTURE) ×2 IMPLANT
SUT MNCRL AB 3-0 PS2 18 (SUTURE) ×4 IMPLANT
SUT MNCRL AB 4-0 PS2 18 (SUTURE) ×2 IMPLANT
SUT PDS AB 0 CT1 36 (SUTURE) ×4 IMPLANT
SUT PDS AB 1 CTX 36 (SUTURE) IMPLANT
SUT PDS AB 1 TP1 96 (SUTURE) IMPLANT
SUT PDS AB 2-0 CT2 27 (SUTURE) ×10 IMPLANT
SUT PROLENE 2 0 KS (SUTURE) IMPLANT
SUT SILK 2 0 (SUTURE)
SUT SILK 2 0 SH CR/8 (SUTURE) IMPLANT
SUT SILK 2-0 18XBRD TIE 12 (SUTURE) IMPLANT
SUT SILK 3 0 (SUTURE)
SUT SILK 3 0 SH CR/8 (SUTURE) IMPLANT
SUT SILK 3-0 18XBRD TIE 12 (SUTURE) IMPLANT
SUT VIC AB 2-0 SH 18 (SUTURE) ×2 IMPLANT
SUT VIC AB 3-0 SH 18 (SUTURE) ×4 IMPLANT
SUT VIC AB 3-0 SH 8-18 (SUTURE) ×2 IMPLANT
SUT VICRYL 2 0 18  UND BR (SUTURE) ×1
SUT VICRYL 2 0 18 UND BR (SUTURE) ×1 IMPLANT
SUT VICRYL 3 0 BR 18  UND (SUTURE) ×1
SUT VICRYL 3 0 BR 18 UND (SUTURE) ×1 IMPLANT
TISSUE MATRIX STRATTICE 16X20 (Mesh General) ×2 IMPLANT
TOWEL OR NON WOVEN STRL DISP B (DISPOSABLE) ×4 IMPLANT
TRAY FOLEY W/METER SILVER 16FR (SET/KITS/TRAYS/PACK) ×2 IMPLANT
TUBING CONNECTING 10 (TUBING) IMPLANT
TUNNELER SHEATH ON-Q 16GX12 DP (PAIN MANAGEMENT) IMPLANT

## 2017-10-03 NOTE — Transfer of Care (Signed)
Immediate Anesthesia Transfer of Care Note  Patient: Yolanda Watson  Procedure(s) Performed: EXCISION OF COLON CANCER TUMOR FROM ABDOMINAL (N/A ) ABDOMINAL WALL RECONSTRUCTION WITH STATTICE (N/A )  Patient Location: PACU  Anesthesia Type:General  Level of Consciousness: awake, alert  and oriented  Airway & Oxygen Therapy: Patient Spontanous Breathing and Patient connected to face mask oxygen  Post-op Assessment: Report given to RN and Post -op Vital signs reviewed and stable  Post vital signs: Reviewed and stable  Last Vitals:  Vitals:   10/03/17 0526  BP: (!) 144/79  Pulse: (!) 101  Resp: 18  Temp: 36.7 C  SpO2: 97%    Last Pain:  Vitals:   10/03/17 0526  TempSrc: Oral      Patients Stated Pain Goal: 4 (70/96/28 3662)  Complications: No apparent anesthesia complications

## 2017-10-03 NOTE — Anesthesia Preprocedure Evaluation (Signed)
Anesthesia Evaluation  Patient identified by MRN, date of birth, ID band Patient awake    Reviewed: Allergy & Precautions, NPO status , Patient's Chart, lab work & pertinent test results  Airway Mallampati: II  TM Distance: >3 FB Neck ROM: Full    Dental  (+) Teeth Intact, Dental Advisory Given   Pulmonary Current Smoker,    breath sounds clear to auscultation       Cardiovascular  Rhythm:Regular     Neuro/Psych    GI/Hepatic   Endo/Other    Renal/GU      Musculoskeletal   Abdominal   Peds  Hematology   Anesthesia Other Findings   Reproductive/Obstetrics                             Anesthesia Physical Anesthesia Plan  ASA: III  Anesthesia Plan: General   Post-op Pain Management:    Induction: Intravenous  PONV Risk Score and Plan: 1 and Ondansetron and Dexamethasone  Airway Management Planned: Oral ETT  Additional Equipment:   Intra-op Plan:   Post-operative Plan: Extubation in OR  Informed Consent: I have reviewed the patients History and Physical, chart, labs and discussed the procedure including the risks, benefits and alternatives for the proposed anesthesia with the patient or authorized representative who has indicated his/her understanding and acceptance.   Dental advisory given  Plan Discussed with: CRNA and Anesthesiologist  Anesthesia Plan Comments:         Anesthesia Quick Evaluation

## 2017-10-03 NOTE — Anesthesia Procedure Notes (Signed)
Procedure Name: Intubation Performed by: Kendrea Cerritos J Pre-anesthesia Checklist: Patient identified, Emergency Drugs available, Suction available, Patient being monitored and Timeout performed Patient Re-evaluated:Patient Re-evaluated prior to induction Oxygen Delivery Method: Circle system utilized Preoxygenation: Pre-oxygenation with 100% oxygen Induction Type: IV induction Ventilation: Mask ventilation without difficulty Laryngoscope Size: Mac and 3 Grade View: Grade I Tube type: Oral Tube size: 7.0 mm Number of attempts: 1 Airway Equipment and Method: Stylet Placement Confirmation: ETT inserted through vocal cords under direct vision,  positive ETCO2,  CO2 detector and breath sounds checked- equal and bilateral Secured at: 21 cm Tube secured with: Tape Dental Injury: Teeth and Oropharynx as per pre-operative assessment        

## 2017-10-03 NOTE — Op Note (Signed)
Operative Note   DATE OF OPERATION: 10.5.18  LOCATION: [facility] Main OR-inpatient  SURGICAL DIVISION: Plastic Surgery  PREOPERATIVE DIAGNOSES:  1.Metastatic colon cancer to abdominal wall 2. Post chemotherapy, radiation  POSTOPERATIVE DIAGNOSES:  same  PROCEDURE:  1. Abdominal wall reconstruction with [name] 20 x 14 cm  SURGEON: [name] MD MBA  ASSISTANTMamie Laurel MD  ANESTHESIA:  General.   EBL: 150 ml for entire  COMPLICATIONS: None immediate.   INDICATIONS FOR PROCEDURE:  The patient, Yolanda Watson, is a 66 y.o. female born on Nov 30, 1951, is here for reconstruction abdominal wall following resection colon cancer metastatic to abdominal wall. She has completed neoadjuvant radiation and chemotherapy.   FINDINGS: Following resection mass, defect included resection near entirety bilateral recuts abdominus muscles from umbilicus caudal to pubic symphysis with defect 20 x 12 cm.  DESCRIPTION OF PROCEDURE:  The patient's operative site was marked with the patient in the preoperative area. The patient was taken to the operating room. SCDs were placed and IV antibiotics were given. Foley catheter placed. The patient's operative site was prepped and draped in a sterile fashion. A time out was performed and all information was confirmed to be correct.  Please refer to Dr. Barry Dienes note for description of abdominal wall mass. Following this cavity irrigated and hemostasis obtained. [name] derma substrate was prepared and trimmed to defect. The [name] was sewn to abdominal wall with interrupted 2-0 and 0 PDS mattress suture to abdominal wall resection margins full thickness of abdominal wall. At pubic symphysis the [name] was secured to bone. Total amount [name] utilized 20 x 14 cm  19 Fr JP placed in subcutaneous position and secured with 2-0 nylon. Skin flaps able to be primarily closed with interrupted 3-0 monocryl in superficial fascia followed by staples for skin closure.  Incisional wound VAC applied and set to 125 mm Hg continuous.   The patient was allowed to wake from anesthesia, extubated and taken to the recovery room in satisfactory condition.   SPECIMENS: none  DRAINS: 19 Fr JP in subcutaneous abdomen  [name], MD [facility]astic [facility] Re[facility] Surgery [phone], pin [phone]

## 2017-10-03 NOTE — Op Note (Signed)
PRE-OPERATIVE DIAGNOSIS: recurrent colon cancer in lower abdominal wall  POST-OPERATIVE DIAGNOSIS:  Same  PROCEDURE:  Procedure(s): Excision of malignant mass (colon cancer) from abdominal wall including skin, muscle, fascia and peritoneum, 15 x 12 cm  SURGEON:  Surgeon(s): Stark Klein, MD  ASSISTANT:   [name], MD  ANESTHESIA:   general  DRAINS: (53 Fr) Blake drain(s) in the subcutaneous tissue per [name]   LOCAL MEDICATIONS USED:  NONE  SPECIMEN:  Source of Specimen:  abdominal wall mass, recurrent colon cacner  DISPOSITION OF SPECIMEN:  PATHOLOGY  COUNTS:  YES  DICTATION: .Dragon Dictation  PLAN OF CARE: Admit to inpatient   PATIENT DISPOSITION:  PACU - hemodynamically stable.  FINDINGS:  Firm abdominal wall recurrent colon cancer invading skin, fascia, muscle, and peritoneum; adherent to the pubic tubercle.  EBL: 50 mL  PROCEDURE:   Patient was identified in the holding area and taken to the operating room where she was placed supine on operating room table. General anesthesia was induced. Her abdomen was prepped and draped in sterile fashion. Timeout was performed according to the surgical safety checklist. When all was correct, we continued.  An elliptical incision was marked to incorporate the skin that appear to be invaded by tumor. The #10 blade was used to make a skin incision in this location. The Bovie was used to get through to the deep dermal tissues. Then retractors were used to elevate the skin and the cautery was used to create skin flaps going around the mass until we arrived at the muscle. This was done in all directions. Inferiorly, the mass was right on the pubic tubercle.  Once the external oblique fascia was exposed on both sides, the fascia was entered with the cautery. Curved Mayo scissors were used to divide the fascia. The muscle layers was divided with the cautery. The inferior epigastric vessels were clipped. Care was taken to  protect the abdominal viscera once the peritoneum was entered. The lower surface of the mass was adherent to the peritoneum, so it was taken en bloc. This was elevated. There were several adhesions that were taken down sharply from the posterior abdominal wall. One of the loops of small bowel was adherent to the peritoneum and as expected, a small serosal defect was noted after sharp dissection. There was no gross tumor in this location. The peritoneum appeared to be intact at this location. The serosal defect was reapproximated with interrupted 3-0 Vicryl pops.  The dissection continued around the abdominal wall incorporating all layers heading toward the pubic tubercle. The mass was taken directly off the pubic tubercle. The mass was carefully labeled with the skin and the fascia. 12:00, 3:00, 6:00, 9:00 sutures were labeled and the mass was passed off the table.  The periosteal elevator was used to scrape the pubic tubercle.    A small right sided subcutaneous mass was noted and was taken with the bovie.  This may be an inguinal lymph node.  This was 3x1 cm.    Hemostasis was achieved. The abdomen and abdominal wall were irrigated with water.  The patient was then turned over to [name] for closure of the abdominal wall defect. Sponges and needles were counted at this time prior to closure. These were correct. Instruments were also counted and counts were correct.

## 2017-10-03 NOTE — H&P (Signed)
Subjective:     Patient ID: Yolanda Watson is a 66 y.o. female.  HPI  Referred by Dr. Barry Dienes for consultation. The patient has a history of right colon cancer diagnosed 2016. She underwent a colectomy and temporary colostomy in [place], invasive adenocarcinoma, well-differentiated ; microscopic tumor extension into mesenteric adipose tissue and focally involving visceral peritoneum; radial and mucosal margins negative; lymph-vascular invasion withsuspicious foci identified;perineural invasion not identified; 16 negative lymph nodes; [id number]. She did not receive chemotherapy or radiation at that time. She underwent subsequent colostomy. She relocated to [facility], and presented in April 2018 after noting fullness at the lower aspect of her incision line. A CT on 04/22/2017 revealed a large heterogenous anterior pelvic wall mass, a smaller lesion in the right anterior inguinal area, rounded bilateral iliac chain lymph node measuring up to 54mm, a top-normal retroperitoneal nodes measuring approximately 9 mm in short axis is seen in the cavoatrial region. Biopsy on 04/29/2017 revealed the anterior abdominal/pelvic wall mass-acellular mucin dissecting fibrous stroma. She completed neoadjuvant chemotherapy  07/10/2017. A restaging CT on 07/21/2017 revealed mild decrease in the size of the right inginal node and the anterior abdominal wall mass was decreased to 9.3 x 12.4 x 11.3 cm, from previous 10.5 x 13.3 x 11.5 cm. She has started neoadjuvant radiation with plan for resection to follow. Large fascial defect abdomen anticipated.  Current active smoker- states she and husband trying to quit, down to half pack. Both retired from Mining engineer business. Chart states history alcohol abuse. Weight stable  Review of Systems 12 point review negative    Objective:   Physical Exam  Cardiovascular: Normal rate and regular rhythm.   Pulmonary/Chest: Effort normal. Clear to auscultation  Abd: midline scar  with right LQ transverse scar from ostomy closure. No hernia noted. Large lower abdominal mass approximately 15 cm diameter, majority of skin is mobile over mass with centraly portion involving midline scar tethered. No ulceration    Assessment:     Colon cancer metastatic to LN, abdominal wall Neoadjuvant chemoradiation    Plan:      Patient needs to stop smoking- reviewed needs to be off all nicotine products, active use will place her very high risk wound healing problems. Counseled that could mean open bowel.  Recommend protein supplementation in anticipation surgery.  CT reviewed- large mass that involves abdominal wall and at least right rectus. Agree with Dr. Barry Dienes that if current mobile skin can be preserved then can plan excision mass including abdominal wall with [name] or ADM reconstruction. Discussed porcine source Stattice, drains. Risks skin flap necrosis, wound healing problems, infection, seroma, need for additional surgery, DVT/PE, cardiopulmonary complications reviewed.  [name], MD [facility] Plastic & Reconstructive Surgery [phone], pin [phone]

## 2017-10-03 NOTE — Anesthesia Postprocedure Evaluation (Signed)
Anesthesia Post Note  Patient: Yolanda Watson  Procedure(s) Performed: EXCISION OF COLON CANCER TUMOR FROM ABDOMINAL (N/A ) ABDOMINAL WALL RECONSTRUCTION WITH STATTICE (N/A )     Patient location during evaluation: PACU Anesthesia Type: General Level of consciousness: awake, awake and alert and oriented Pain management: pain level controlled Vital Signs Assessment: post-procedure vital signs reviewed and stable Respiratory status: spontaneous breathing, nonlabored ventilation and respiratory function stable Cardiovascular status: blood pressure returned to baseline Anesthetic complications: no    Last Vitals:  Vitals:   10/03/17 1428 10/03/17 1612  BP: (!) 144/87   Pulse: 96   Resp: 17 10  Temp: 36.4 C   SpO2: 98% 99%    Last Pain:  Vitals:   10/03/17 1612  TempSrc:   PainSc: 2                  Delainy Mcelhiney COKER

## 2017-10-04 LAB — BASIC METABOLIC PANEL
ANION GAP: 4 — AB (ref 5–15)
BUN: 7 mg/dL (ref 6–20)
CHLORIDE: 105 mmol/L (ref 101–111)
CO2: 28 mmol/L (ref 22–32)
Calcium: 8.1 mg/dL — ABNORMAL LOW (ref 8.9–10.3)
Creatinine, Ser: 0.58 mg/dL (ref 0.44–1.00)
GFR calc non Af Amer: >60 mL/min (ref >60)
Glucose, Bld: 160 mg/dL — ABNORMAL HIGH (ref 65–99)
POTASSIUM: 4.5 mmol/L (ref 3.5–5.1)
Sodium: 137 mmol/L (ref 135–145)

## 2017-10-04 LAB — CBC
HEMATOCRIT: 32 % — AB (ref 36.0–46.0)
HEMOGLOBIN: 10.7 g/dL — AB (ref 12.0–15.0)
MCH: 33.5 pg (ref 26.0–34.0)
MCHC: 33.4 g/dL (ref 30.0–36.0)
MCV: 100.3 fL — ABNORMAL HIGH (ref 78.0–100.0)
Platelets: 149 10*3/uL — ABNORMAL LOW (ref 150–400)
RBC: 3.19 MIL/uL — ABNORMAL LOW (ref 3.87–5.11)
RDW: 13.2 % (ref 11.5–15.5)
WBC: 7.4 10*3/uL (ref 4.0–10.5)

## 2017-10-04 MED ORDER — PANTOPRAZOLE SODIUM 40 MG PO TBEC
40.0000 mg | DELAYED_RELEASE_TABLET | Freq: Every day | ORAL | Status: DC
  Administered 2017-10-04 – 2017-10-07 (×4): 40 mg via ORAL
  Filled 2017-10-04 (×4): qty 1

## 2017-10-04 NOTE — Progress Notes (Signed)
The patient is receiving Protonix by the intravenous route.  Based on criteria approved by the Pharmacy and [place], the medication is being converted to the equivalent oral dose form.  These criteria include: -No active GI bleeding -Able to tolerate diet of full liquids (or better) or tube feeding -Able to tolerate other medications by the oral or enteral route  If you have any questions about this conversion, please contact the Pharmacy Department (phone [phone]).  Thank you. Eudelia Bunch, Pharm.D. [phone] 10/04/2017 8:47 AM

## 2017-10-04 NOTE — Progress Notes (Signed)
  Plastic Surgery  POD#1 excision colon ca metastatic to abdominal wall, abdominal wall reconstruction with [name]  Temp:  [97.4 F (36.3 C)-98.6 F (37 C)] 98.6 F (37 C) (10/06 0620) Pulse Rate:  [84-109] 86 (10/06 0620) Resp:  [10-20] 17 (10/06 0620) BP: (106-160)/(67-91) 106/72 (10/06 0620) SpO2:  [92 %-100 %] 100 % (10/06 0620)   PO 330 JP 235  States pain "7" , on PCA, wants to get out bed, no nausea, wants real food.  PE  Incisional VAC in place JP serosanguinous abd soft flat  A/P Plan incisional VAC for 3-5 days pending hospital stay [place] to be OOB with assist On DVT px Tolerating clears  [name], MD [facility] Plastic & Reconstructive Surgery [phone], pin [phone]

## 2017-10-04 NOTE — Plan of Care (Signed)
Problem: Safety: Goal: Ability to remain free from injury will improve Outcome: Progressing No safety issues noted  Problem: Pain Managment: Goal: General experience of comfort will improve Outcome: Progressing Pain well managed with Morphine PCA  Problem: Skin Integrity: Goal: Risk for impaired skin integrity will decrease Outcome: Progressing Abdominal incision with black foam for wound vac dry and intact, no other skin issues noted  Problem: Tissue Perfusion: Goal: Risk factors for ineffective tissue perfusion will decrease Outcome: Progressing No signs of DVT reported, SCDs are on  Problem: Activity: Goal: Risk for activity intolerance will decrease Outcome: Not Progressing Remains in the bed  Problem: Nutrition: Goal: Adequate nutrition will be maintained Outcome: Not Progressing NPO  Problem: Bowel/Gastric: Goal: Will not experience complications related to bowel motility Outcome: Progressing No bowel complications reported

## 2017-10-04 NOTE — Progress Notes (Addendum)
1 Day Post-Op   Subjective/Chief Complaint: No overnight complaints.  Doing well.  Using PCA.  No n/v.  No flatus.  Pt states she is sore, but pain is much less than before when large tumor was present.    Objective: Vital signs in last 24 hours: Temp:  [97.4 F (36.3 C)-98.6 F (37 C)] 98.6 F (37 C) (10/06 0620) Pulse Rate:  [84-109] 86 (10/06 0620) Resp:  [10-20] 17 (10/06 0620) BP: (106-160)/(67-91) 106/72 (10/06 0620) SpO2:  [92 %-100 %] 100 % (10/06 0620) Last BM Date: 10/02/17  Intake/Output from previous day: 10/05 0701 - 10/06 0700 In: 3730 [P.O.:330; I.V.:3400] Out: 2445 [Urine:2060; Drains:235; Blood:150] Intake/Output this shift: No intake/output data recorded.  General appearance: alert, cooperative and no distress Resp: breathing comfortably GI: soft, non distended, approp tender.  drain serosang.  incisional wound vac in place Extremities: LE without c/c/e.  Hands contracted.    Lab Results:   Recent Labs  10/03/17 1308 10/04/17 0406  WBC 7.8 7.4  HGB 12.0 10.7*  HCT 36.8 32.0*  PLT 148* 149*   BMET  Recent Labs  10/03/17 1308 10/04/17 0406  NA  --  137  K  --  4.5  CL  --  105  CO2  --  28  GLUCOSE  --  160*  BUN  --  7  CREATININE 0.49 0.58  CALCIUM  --  8.1*   PT/INR No results for input(s): LABPROT, INR in the last 72 hours. ABG No results for input(s): PHART, HCO3 in the last 72 hours.  Invalid input(s): PCO2, PO2  Studies/Results: No results found.  Anti-infectives: Anti-infectives    Start     Dose/Rate Route Frequency Ordered Stop   10/03/17 2000  ciprofloxacin (CIPRO) IVPB 400 mg     400 mg 200 mL/hr over 60 Minutes Intravenous Every 12 hours 10/03/17 1229 10/03/17 2052   10/03/17 0703  ciprofloxacin (CIPRO) 400 MG/200ML IVPB    Comments:  Herbie Drape   : cabinet override      10/03/17 0703 10/03/17 0816   10/03/17 0530  ciprofloxacin (CIPRO) IVPB 400 mg     400 mg 200 mL/hr over 60 Minutes Intravenous On call to  O.R. 10/03/17 0530 10/03/17 0846      Assessment/Plan: s/p Procedure(s) with comments: EXCISION OF COLON CANCER TUMOR FROM ABDOMINAL (N/A) - TAP BLOCK ABDOMINAL WALL RECONSTRUCTION WITH STATTICE (N/A) Leave foley until tomorrow due to dissection of tumor from anterior bladder.  PCA for pain control Full liquids diet. OOB to chair today.     LOS: 1 day    [facility] 10/04/2017

## 2017-10-05 LAB — BASIC METABOLIC PANEL
Anion gap: 6 (ref 5–15)
BUN: <5 mg/dL — ABNORMAL LOW (ref 6–20)
CO2: 26 mmol/L (ref 22–32)
Calcium: 8.1 mg/dL — ABNORMAL LOW (ref 8.9–10.3)
Chloride: 104 mmol/L (ref 101–111)
Creatinine, Ser: 0.42 mg/dL — ABNORMAL LOW (ref 0.44–1.00)
GFR calc Af Amer: >60 mL/min (ref >60)
GFR calc non Af Amer: >60 mL/min (ref >60)
GLUCOSE: 134 mg/dL — AB (ref 65–99)
Potassium: 3.9 mmol/L (ref 3.5–5.1)
Sodium: 136 mmol/L (ref 135–145)

## 2017-10-05 LAB — CBC
HEMATOCRIT: 34.2 % — AB (ref 36.0–46.0)
HEMOGLOBIN: 11.3 g/dL — AB (ref 12.0–15.0)
MCH: 33.6 pg (ref 26.0–34.0)
MCHC: 33 g/dL (ref 30.0–36.0)
MCV: 101.8 fL — ABNORMAL HIGH (ref 78.0–100.0)
Platelets: 138 10*3/uL — ABNORMAL LOW (ref 150–400)
RBC: 3.36 MIL/uL — ABNORMAL LOW (ref 3.87–5.11)
RDW: 13.4 % (ref 11.5–15.5)
WBC: 6.4 10*3/uL (ref 4.0–10.5)

## 2017-10-05 NOTE — Progress Notes (Signed)
2 Days Post-Op   Subjective/Chief Complaint: Urinated after foley removed.  PCA working for pain control.    Objective: Vital signs in last 24 hours: Temp:  [97.5 F (36.4 C)-99.2 F (37.3 C)] 97.5 F (36.4 C) (10/07 0907) Pulse Rate:  [81-95] 93 (10/07 0907) Resp:  [13-24] 24 (10/07 0907) BP: (111-154)/(61-76) 144/70 (10/07 0907) SpO2:  [98 %-100 %] 99 % (10/07 0907) Last BM Date: 10/02/17  Intake/Output from previous day: 10/06 0701 - 10/07 0700 In: 1363.3 [P.O.:480; I.V.:863.3] Out: 1970 [Urine:1850; Drains:120] Intake/Output this shift: Total I/O In: 120 [P.O.:120] Out: 380 [Urine:350; Drains:30]  General appearance: alert, cooperative and no distress Resp: breathing comfortably GI: soft, non distended, approp tender.  drain serosang.  incisional wound vac in place Extremities: LE without c/c/e.  Hands contracted.    Lab Results:   Recent Labs  10/04/17 0406 10/05/17 0435  WBC 7.4 6.4  HGB 10.7* 11.3*  HCT 32.0* 34.2*  PLT 149* 138*   BMET  Recent Labs  10/04/17 0406 10/05/17 0435  NA 137 136  K 4.5 3.9  CL 105 104  CO2 28 26  GLUCOSE 160* 134*  BUN 7 <5*  CREATININE 0.58 0.42*  CALCIUM 8.1* 8.1*   PT/INR No results for input(s): LABPROT, INR in the last 72 hours. ABG No results for input(s): PHART, HCO3 in the last 72 hours.  Invalid input(s): PCO2, PO2  Studies/Results: No results found.  Anti-infectives: Anti-infectives    Start     Dose/Rate Route Frequency Ordered Stop   10/03/17 2000  ciprofloxacin (CIPRO) IVPB 400 mg     400 mg 200 mL/hr over 60 Minutes Intravenous Every 12 hours 10/03/17 1229 10/03/17 2052   10/03/17 0703  ciprofloxacin (CIPRO) 400 MG/200ML IVPB    Comments:  Herbie Drape   : cabinet override      10/03/17 0703 10/03/17 0816   10/03/17 0530  ciprofloxacin (CIPRO) IVPB 400 mg     400 mg 200 mL/hr over 60 Minutes Intravenous On call to O.R. 10/03/17 0530 10/03/17 0846      Assessment/Plan: s/p Procedure(s)  with comments: EXCISION OF COLON CANCER TUMOR FROM ABDOMINAL (N/A) - TAP BLOCK ABDOMINAL WALL RECONSTRUCTION WITH STATTICE (N/A)   PCA for pain control Await return of bowel function prior to placing on regular diet.    Ambulate as tolerated.     LOS: 2 days    [facility] 10/05/2017

## 2017-10-05 NOTE — Progress Notes (Signed)
  Plastic Surgery  POD#2 excision colon ca metastatic to abdominal wall, abdominal wall reconstruction with [name]  Temp:  [97.5 F (36.4 C)-99.2 F (37.3 C)] 97.5 F (36.4 C) (10/07 0907) Pulse Rate:  [81-95] 93 (10/07 0907) Resp:  [13-24] 24 (10/07 0907) BP: (111-154)/(61-76) 144/70 (10/07 0907) SpO2:  [98 %-100 %] 99 % (10/07 0907)   PO 480 JP 120  No flatus  PE  Incisional VAC in place JP serosanguinous abd soft flat  A/P Plan incisional VAC for 3-5 days pending hospital stay OOB as tolerated On DVT px Tolerating clears- adv diet Nutrition consult for supplements No nicotine patches please  [name], MD [facility] Plastic & Reconstructive Surgery [phone], pin [phone]

## 2017-10-06 LAB — CBC
HEMATOCRIT: 35.3 % — AB (ref 36.0–46.0)
HEMOGLOBIN: 11.5 g/dL — AB (ref 12.0–15.0)
MCH: 33 pg (ref 26.0–34.0)
MCHC: 32.6 g/dL (ref 30.0–36.0)
MCV: 101.1 fL — AB (ref 78.0–100.0)
Platelets: 156 10*3/uL (ref 150–400)
RBC: 3.49 MIL/uL — ABNORMAL LOW (ref 3.87–5.11)
RDW: 13.1 % (ref 11.5–15.5)
WBC: 5.4 10*3/uL (ref 4.0–10.5)

## 2017-10-06 LAB — BASIC METABOLIC PANEL
Anion gap: 10 (ref 5–15)
CO2: 30 mmol/L (ref 22–32)
Calcium: 8.3 mg/dL — ABNORMAL LOW (ref 8.9–10.3)
Chloride: 96 mmol/L — ABNORMAL LOW (ref 101–111)
Creatinine, Ser: 0.51 mg/dL (ref 0.44–1.00)
GFR calc non Af Amer: >60 mL/min (ref >60)
GLUCOSE: 119 mg/dL — AB (ref 65–99)
Potassium: 4.2 mmol/L (ref 3.5–5.1)
SODIUM: 136 mmol/L (ref 135–145)

## 2017-10-06 MED ORDER — OXYCODONE HCL 5 MG PO TABS
5.0000 mg | ORAL_TABLET | ORAL | Status: DC | PRN
  Administered 2017-10-06: 5 mg via ORAL
  Administered 2017-10-06 – 2017-10-07 (×4): 10 mg via ORAL
  Administered 2017-10-07 – 2017-10-08 (×4): 5 mg via ORAL
  Filled 2017-10-06 (×4): qty 1
  Filled 2017-10-06 (×4): qty 2
  Filled 2017-10-06: qty 1

## 2017-10-06 NOTE — Progress Notes (Signed)
Initial Nutrition Assessment  DOCUMENTATION CODES:   Underweight  INTERVENTION:   -Patient declines nutritional supplements -Reviewed high protein foods and encouraged pt to consume at least 60-70g of protein daily -RD will continue to monitor  NUTRITION DIAGNOSIS:   Increased nutrient needs related to wound healing (surgical healing) as evidenced by estimated needs.  GOAL:   Patient will meet greater than or equal to 90% of their needs  MONITOR:   PO intake, Labs, Weight trends, I & O's  REASON FOR ASSESSMENT:   Consult Wound healing  ASSESSMENT:   66 year old female who presents for a follow-up for Colorectal cancer. Patient is a 66 year old female of Yolanda Watson who has metastatic colon cancer to the abdominal wall. The patient was diagnosed with colon cancer at [facility] in 2016. She underwent a right hemicolectomy by Yolanda Watson. Final pathology was pT4aN0 MSI stable. She did not receive chemotherapy. She started feeling pain and a bulge in her lower abdominal wall in April 2018. 10/5: s/p EXCISION OF COLON CANCER TUMOR FROM ABDOMINAL (N/A ) ABDOMINAL WALL RECONSTRUCTION WITH STATTICE (N/A )  Patient reports good appetite and eating "like a pig" prior to surgery on 10/5. Pt feels hungry now but is only on full liquids.PO intake this morning: 100%. States she is trying to have a BM. Pt has been tolerating her liquid diet with no issues.  Recommended protein shakes to patient to aid in healing from surgery. Pt declines at this time and insists that she consumes a high protein diet at home. Reviewed high protein foods and encouraged pt to eat ~60-70g of protein daily.  Per chart, weight is stable. Nutrition-Focused physical exam completed. Findings are mild fat depletion, mild muscle depletion, and no edema.   Labs reviewed. Medications: Protonix tablet daily, Senokot tablet BID, D5 and .45% NaCl w/ KCl infusion at 50 ml/hr -provides 204 kcal  Diet Order:  Diet full  liquid Room service appropriate? Yes; Fluid consistency: Thin  Skin:  Wound (see comment) (10/5 abdominal incision)  Last BM:  10/4  Height:   Ht Readings from Last 1 Encounters:  10/03/17 '5\' 6"'$  (1.676 m)    Weight:   Wt Readings from Last 1 Encounters:  10/03/17 114 lb (51.7 kg)    Ideal Body Weight:  59.1 kg  BMI:  Body mass index is 18.4 kg/m.  Estimated Nutritional Needs:   Kcal:  1550-1750  Protein:  65-75g  Fluid:  1.7L/day  EDUCATION NEEDS:   Education needs addressed  Clayton Bibles, MS, RD, LDN Pager: [phone] After Hours Pager: [phone]

## 2017-10-06 NOTE — Progress Notes (Signed)
  Plastic Surgery  POD#3 excision colon ca metastatic to abdominal wall, abdominal wall reconstruction with [name]  Temp:  [97.5 F (36.4 C)-98.7 F (37.1 C)] 98.1 F (36.7 C) (10/08 0307) Pulse Rate:  [88-95] 88 (10/08 0307) Resp:  [13-24] 14 (10/08 0750) BP: (103-144)/(62-75) 113/69 (10/08 0307) SpO2:  [93 %-100 %] 100 % (10/08 0750)   PO 120 JP 185  States small flatus  PE  Incisional VAC in place JP serosanguinous abd soft flat  A/P Plan incisional VAC for 3-5 days pending hospital stay OOB as tolerated- states 15 steps to apartment , will get PT eval On DVT px Tolerating liquids- diet per Surgery Nutrition consult for supplements No nicotine patches please  [name], MD [facility] Plastic & Reconstructive Surgery [phone], pin [phone]

## 2017-10-06 NOTE — Progress Notes (Signed)
Morphine 71ml wasted down sink with Oley Balm, RN.

## 2017-10-06 NOTE — Evaluation (Signed)
Physical Therapy Evaluation Patient Details Name: Yolanda Watson MRN: [id number] DOB: 08-04-1951 Today's Date: 10/06/2017   History of Present Illness  66 yo female  s/pexcision colon ca metastatic to abdominal wall, abdominal wall reconstruction 10/03/17  Clinical Impression  The patient reports that  Spouse assists as needed. Patient reports  Fairly independent  With ADL's, Practiced steps of which sideways with 1 rail appeared to be the safest and provide most support. No further PT  At this time    Follow Up Recommendations No PT follow up    Equipment Recommendations  None recommended by PT    Recommendations for Other Services       Precautions / Restrictions Precautions Precautions: Fall Precaution Comments: abdomen drain, VAC      Mobility  Bed Mobility Overal bed mobility: Needs Assistance Bed Mobility: Sidelying to Sit;Sit to Sidelying   Sidelying to sit: Min guard     Sit to sidelying: Min guard General bed mobility comments: cues for abdmen protection  Transfers Overall transfer level: Needs assistance Equipment used: 1 person hand held assist Transfers: Sit to/from Stand Sit to Stand: Supervision         General transfer comment: light HHA   Ambulation/Gait Ambulation/Gait assistance: Min guard Ambulation Distance (Feet): 60 Feet Assistive device: 1 person hand held assist Gait Pattern/deviations: Step-to pattern;Step-through pattern     General Gait Details: light HHA  at times, patient reports that  her balance is at baseline  Stairs Stairs: Yes Stairs assistance: Min guard;Min assist Stair Management: Forwards;One rail Right   General stair comments: up 2 with 1 rail then up 5 with sideways approach which was much safer and steadier.  Wheelchair Mobility    Modified Rankin (Stroke Patients Only)       Balance                                             Pertinent Vitals/Pain Pain Assessment: 0-10 Pain Score:  5  Pain Location: abdomen Pain Descriptors / Indicators: Cramping;Discomfort Pain Intervention(s): Monitored during session    Home Living Family/patient expects to be discharged to:: Private residence Living Arrangements: Spouse/significant other Available Help at Discharge: Family Type of Home: Apartment Home Access: Stairs to enter Entrance Stairs-Rails: Psychiatric nurse of Steps: 13 Home Layout: One level Home Equipment: None      Prior Function Level of Independence: Needs assistance   Gait / Transfers Assistance Needed: independent  ADL's / Homemaking Assistance Needed: cooks        Journalist, newspaper        Extremity/Trunk Assessment   Upper Extremity Assessment Upper Extremity Assessment: RUE deficits/detail;LUE deficits/detail RUE Deficits / Details: hands are contracted in flexion from previous hand injury, grossly uses thum to fingr for gripping, able to tie gown LUE Deficits / Details: similar to right    Lower Extremity Assessment Lower Extremity Assessment: Generalized weakness    Cervical / Trunk Assessment Cervical / Trunk Assessment: Normal  Communication   Communication: No difficulties  Cognition Arousal/Alertness: Awake/alert Behavior During Therapy: WFL for tasks assessed/performed Overall Cognitive Status: Within Functional Limits for tasks assessed                                        General Comments  Exercises     Assessment/Plan    PT Assessment Patent does not need any further PT services  PT Problem List         PT Treatment Interventions      PT Goals (Current goals can be found in the Care Plan section)  Acute Rehab PT Goals Patient Stated Goal: go home PT Goal Formulation: All assessment and education complete, DC therapy    Frequency     Barriers to discharge        Co-evaluation               AM-PAC PT "6 Clicks" Daily Activity  Outcome Measure Difficulty turning  over in bed (including adjusting bedclothes, sheets and blankets)?: A Little Difficulty moving from lying on back to sitting on the side of the bed? : A Little Difficulty sitting down on and standing up from a chair with arms (e.g., wheelchair, bedside commode, etc,.)?: A Little Help needed moving to and from a bed to chair (including a wheelchair)?: A Little Help needed walking in hospital room?: A Little Help needed climbing 3-5 steps with a railing? : A Little 6 Click Score: 18    End of Session Equipment Utilized During Treatment: Gait belt Activity Tolerance: Patient tolerated treatment well Patient left: in bed;with call bell/phone within reach Nurse Communication: Mobility status PT Visit Diagnosis: Difficulty in walking, not elsewhere classified (R26.2);Pain    Time: 1342-1400 PT Time Calculation (min) (ACUTE ONLY): 18 min   Charges:   PT Evaluation $PT Eval Low Complexity: 1 Low     PT G CodesTresa Endo PT [phone]   Claretha Cooper 10/06/2017, 3:54 PM

## 2017-10-06 NOTE — Progress Notes (Signed)
3 Days Post-Op   Subjective/Chief Complaint: Pain controlled.  No n/v.  No BM yet.    Objective: Vital signs in last 24 hours: Temp:  [97.7 F (36.5 C)-98.7 F (37.1 C)] 98.3 F (36.8 C) (10/08 1317) Pulse Rate:  [88-100] 100 (10/08 1317) Resp:  [13-22] 16 (10/08 1317) BP: (98-139)/(60-75) 98/60 (10/08 1317) SpO2:  [93 %-100 %] 94 % (10/08 1317) Last BM Date: 10/02/17  Intake/Output from previous day: 10/07 0701 - 10/08 0700 In: 2934.2 [P.O.:120; I.V.:2814.2] Out: 4270 [Urine:1500; Drains:185] Intake/Output this shift: Total I/O In: 240 [P.O.:240] Out: 30 [Drains:30]  General appearance: alert, cooperative and no distress Resp: breathing comfortably GI: soft, non distended, approp tender.  drain serosang.  incisional wound vac in place Extremities: LE without c/c/e.  Hands contracted.    Lab Results:   Recent Labs  10/05/17 0435 10/06/17 0515  WBC 6.4 5.4  HGB 11.3* 11.5*  HCT 34.2* 35.3*  PLT 138* 156   BMET  Recent Labs  10/05/17 0435 10/06/17 0515  NA 136 136  K 3.9 4.2  CL 104 96*  CO2 26 30  GLUCOSE 134* 119*  BUN <5* <5*  CREATININE 0.42* 0.51  CALCIUM 8.1* 8.3*   PT/INR No results for input(s): LABPROT, INR in the last 72 hours. ABG No results for input(s): PHART, HCO3 in the last 72 hours.  Invalid input(s): PCO2, PO2  Studies/Results: No results found.  Anti-infectives: Anti-infectives    Start     Dose/Rate Route Frequency Ordered Stop   10/03/17 2000  ciprofloxacin (CIPRO) IVPB 400 mg     400 mg 200 mL/hr over 60 Minutes Intravenous Every 12 hours 10/03/17 1229 10/03/17 2052   10/03/17 0703  ciprofloxacin (CIPRO) 400 MG/200ML IVPB    Comments:  Herbie Drape   : cabinet override      10/03/17 0703 10/03/17 0816   10/03/17 0530  ciprofloxacin (CIPRO) IVPB 400 mg     400 mg 200 mL/hr over 60 Minutes Intravenous On call to O.R. 10/03/17 0530 10/03/17 0846      Assessment/Plan: s/p Procedure(s) with comments: EXCISION OF  COLON CANCER TUMOR FROM ABDOMINAL (N/A) - TAP BLOCK ABDOMINAL WALL RECONSTRUCTION WITH STATTICE (N/A)   Transition to oral pain meds.  Saline lock PCA.   Await return of bowel function prior to placing on regular diet.  If no BM by tomorrow, will do suppository.    Ambulate as tolerated.     LOS: 3 days    Yolanda Watson 10/06/2017

## 2017-10-07 LAB — CBC
HCT: 33.7 % — ABNORMAL LOW (ref 36.0–46.0)
HEMOGLOBIN: 11.2 g/dL — AB (ref 12.0–15.0)
MCH: 33.3 pg (ref 26.0–34.0)
MCHC: 33.2 g/dL (ref 30.0–36.0)
MCV: 100.3 fL — ABNORMAL HIGH (ref 78.0–100.0)
PLATELETS: 189 10*3/uL (ref 150–400)
RBC: 3.36 MIL/uL — AB (ref 3.87–5.11)
RDW: 13.2 % (ref 11.5–15.5)
WBC: 5.2 10*3/uL (ref 4.0–10.5)

## 2017-10-07 LAB — BASIC METABOLIC PANEL
Anion gap: 9 (ref 5–15)
BUN: 5 mg/dL — AB (ref 6–20)
CO2: 30 mmol/L (ref 22–32)
CREATININE: 0.64 mg/dL (ref 0.44–1.00)
Calcium: 8.4 mg/dL — ABNORMAL LOW (ref 8.9–10.3)
Chloride: 98 mmol/L — ABNORMAL LOW (ref 101–111)
Glucose, Bld: 103 mg/dL — ABNORMAL HIGH (ref 65–99)
POTASSIUM: 4.1 mmol/L (ref 3.5–5.1)
SODIUM: 137 mmol/L (ref 135–145)

## 2017-10-07 MED ORDER — BISACODYL 10 MG RE SUPP
10.0000 mg | Freq: Once | RECTAL | Status: AC
  Administered 2017-10-07: 10 mg via RECTAL
  Filled 2017-10-07: qty 1

## 2017-10-07 NOTE — Progress Notes (Signed)
4 Days Post-Op   Subjective/Chief Complaint: No BM yet as of this AM, but was called by nurse that she had BM after suppository.  Pain well controlled.    Objective: Vital signs in last 24 hours: Temp:  [98.1 F (36.7 C)-98.5 F (36.9 C)] 98.5 F (36.9 C) (10/09 0451) Pulse Rate:  [92-100] 92 (10/09 0451) Resp:  [16-18] 18 (10/09 0451) BP: (98-137)/(58-72) 137/72 (10/09 0451) SpO2:  [93 %-95 %] 95 % (10/09 0451) Last BM Date: 10/02/17  Intake/Output from previous day: 10/08 0701 - 10/09 0700 In: 240 [P.O.:240] Out: 430 [Urine:350; Drains:80] Intake/Output this shift: Total I/O In: -  Out: 180 [Urine:150; Drains:30]  General appearance: alert, cooperative and no distress Resp: breathing comfortably GI: soft, non distended, approp tender.  drain serosang.  incisional wound vac in place Extremities: LE without c/c/e.  Hands contracted.    Lab Results:   Recent Labs  10/06/17 0515 10/07/17 0554  WBC 5.4 5.2  HGB 11.5* 11.2*  HCT 35.3* 33.7*  PLT 156 189   BMET  Recent Labs  10/06/17 0515 10/07/17 0554  NA 136 137  K 4.2 4.1  CL 96* 98*  CO2 30 30  GLUCOSE 119* 103*  BUN <5* 5*  CREATININE 0.51 0.64  CALCIUM 8.3* 8.4*   PT/INR No results for input(s): LABPROT, INR in the last 72 hours. ABG No results for input(s): PHART, HCO3 in the last 72 hours.  Invalid input(s): PCO2, PO2  Studies/Results: No results found.  Anti-infectives: Anti-infectives    Start     Dose/Rate Route Frequency Ordered Stop   10/03/17 2000  ciprofloxacin (CIPRO) IVPB 400 mg     400 mg 200 mL/hr over 60 Minutes Intravenous Every 12 hours 10/03/17 1229 10/03/17 2052   10/03/17 0703  ciprofloxacin (CIPRO) 400 MG/200ML IVPB    Comments:  Herbie Drape   : cabinet override      10/03/17 0703 10/03/17 0816   10/03/17 0530  ciprofloxacin (CIPRO) IVPB 400 mg     400 mg 200 mL/hr over 60 Minutes Intravenous On call to O.R. 10/03/17 0530 10/03/17 0846      Assessment/Plan: s/p  Procedure(s) with comments: EXCISION OF COLON CANCER TUMOR FROM ABDOMINAL (N/A) - TAP BLOCK ABDOMINAL WALL RECONSTRUCTION WITH STATTICE (N/A)   Oral pain meds PRN. Regular diet. Home when tolerating reg diet.   Vac/wound per [name].    Ambulate as tolerated.     LOS: 4 days    Kennadie Brenner 10/07/2017

## 2017-10-07 NOTE — Progress Notes (Signed)
  Plastic Surgery  POD#4 excision colon ca metastatic to abdominal wall, abdominal wall reconstruction with [name]  Temp:  [98.1 F (36.7 C)-98.5 F (36.9 C)] 98.5 F (36.9 C) (10/09 0451) Pulse Rate:  [92-100] 92 (10/09 0451) Resp:  [16-18] 18 (10/09 0451) BP: (98-137)/(58-72) 137/72 (10/09 0451) SpO2:  [93 %-95 %] 95 % (10/09 0451)   PO 240 JP 80  BM x 1 overnight Patient declined nutritional supplements No home needs per PT Ambulated in hall, tolerating oral pain medication  PE Alert sitting in chait Incisional VAC in place JP serosanguinous abd soft flat  A/P Will remove incisional VAC tomorrow. Following this ok to shower from Plastics standpoint. On DVT px Tolerating liquids- diet per Surgery, anticipate d/c in next 1-2 days. F/u for next week arranged.  No nicotine patches please  [name], MD [facility] Plastic & Reconstructive Surgery [phone], pin [phone]

## 2017-10-08 MED ORDER — OXYCODONE HCL 5 MG PO TABS: 5.0000 mg | ORAL_TABLET | ORAL | 0 refills | Status: DC | PRN

## 2017-10-08 NOTE — Discharge Instructions (Addendum)
Ok to shower. Soap and water ok. Pat incision dry. No soaking. Do not let drain dangle in shower.  Strip and record JP twice daily and bring log to clinic visit.   Dry dressing as desired over incision.  No lifting > 5 lbs until cleared by MD.

## 2017-10-08 NOTE — Progress Notes (Addendum)
   Plastic Surgery  POD#5 excision colon ca metastatic to abdominal wall, abdominal wall reconstruction with [name]  Temp:  [98.3 F (36.8 C)-98.9 F (37.2 C)] 98.3 F (36.8 C) (10/10 0559) Pulse Rate:  [93-96] 96 (10/10 0559) Resp:  [16-18] 18 (10/10 0559) BP: (104-116)/(68-80) 116/68 (10/10 0559) SpO2:  [93 %-95 %] 93 % (10/10 0559)   PO 480 JP 120  Tol reg diet, no additional BM  PE Incisional VAC removed-epidermolysis at incision edges few mm JP serosanguinous abd soft flat  A/P Ok to shower from Plastics standpoint, dry dressing as needed. Continue JP drain, do not let dangle in shower. Counseled will develop scabs incision, do no pick at these.  F/u arranged next week with myself, reviewed importance of not smoking.  Pathology with mass with negative margins, inguinal mass with 1/8 LN positive  No nicotine patches please  [name], MD [facility] Plastic & Reconstructive Surgery [phone], pin [phone]

## 2017-10-08 NOTE — Discharge Summary (Signed)
Physician Discharge Summary  Patient ID: KAMISHA ELL MRN: [id number] DOB/AGE: 65-Nov-1952 65 y.o.  Admit date: 10/03/2017 Discharge date: 10/08/2017  Admission Diagnoses: Patient Active Problem List   Diagnosis Date Noted  . Local recurrence of colon cancer ([place]) 10/03/2017  . Secondary malignancy of inguinal lymph nodes ([place]) 07/29/2017  . Cancer of right colon ([place]) 05/05/2017    Discharge Diagnoses:  Active Problems:   Local recurrence of colon cancer ([place]) and same as above.    Discharged Condition: stable  Hospital Course: Pt was admitted to the floor following radical excision of colon cancer recurrence from abdominal wall by myself and reconstruction of fascia by Dr Iran Planas.  She was placed on a PCA for pain control.  She had good pain control and was cooperative with the incentive spirometer.  She was able to slowly advance her diet, but she had issues getting to where she could pass gas and have a BM.  She required a suppository to get her bowels moving again.  She was able to void and ambulate independently.  She is discharged to home in stable condition.    Consults: plastic surgery  Significant Diagnostic Studies: labs: HCT 33.7.  Cr 0.64  Treatments: surgery: see above  Discharge Exam: Blood pressure 116/68, pulse 96, temperature 98.3 F (36.8 C), temperature source Oral, resp. rate 18, height 5\' 6"  (1.676 m), weight 51.7 kg (114 lb), SpO2 93 %. General appearance: alert, cooperative and no distress Resp: breathing comfortably GI: soft, non distended.  drain serosang.  no erythema.  no drainage.   Extremities: extremities normal, atraumatic, no cyanosis or edema  Disposition: 01-Home or Self Care  Discharge Instructions    Call MD for:  persistant dizziness or light-headedness    Complete by:  As directed    Call MD for:  persistant nausea and vomiting    Complete by:  As directed    Call MD for:  redness, tenderness, or signs of infection (pain, swelling,  redness, odor or green/yellow discharge around incision site)    Complete by:  As directed    Call MD for:  severe uncontrolled pain    Complete by:  As directed    Call MD for:  temperature >100.4    Complete by:  As directed    Change dressing (specify)    Complete by:  As directed    Measure and record drain output.  Take record to Dr. Para Skeans office.   Diet - low sodium heart healthy    Complete by:  As directed    Increase activity slowly    Complete by:  As directed      Allergies as of 10/08/2017      Reactions   Penicillins Other (See Comments)   Patient stated,"I don't know what kind of reaction I had because I was a kid when it happened." Has patient had a PCN reaction causing immediate rash, facial/tongue/throat swelling, SOB or lightheadedness with hypotension: Yes Has patient had a PCN reaction causing severe rash involving mucus membranes or skin necrosis: No Has patient had a PCN reaction that required hospitalization: No Has patient had a PCN reaction occurring within the last 10 years: No If all of the above answers are "NO", then       Medication List    STOP taking these medications   oxyCODONE-acetaminophen 5-325 MG tablet Commonly known as:  PERCOCET/ROXICET     TAKE these medications   lidocaine-prilocaine cream Commonly known as:  EMLA APPLY TO  PORTACATH 1-2 HOURS PRIOR TO USE   naproxen sodium 220 MG tablet Commonly known as:  ANAPROX Take 440 mg by mouth daily as needed (PAIN).   oxyCODONE 5 MG immediate release tablet Commonly known as:  Oxy IR/ROXICODONE Take 1-2 tablets (5-10 mg total) by mouth every 4 (four) hours as needed for moderate pain, severe pain or breakthrough pain.   prochlorperazine 10 MG tablet Commonly known as:  COMPAZINE Take 1 tablet (10 mg total) by mouth every 6 (six) hours as needed for nausea or vomiting.            Discharge Care Instructions        Start     Ordered   10/08/17 0000  Change dressing  (specify)    Comments:  Measure and record drain output.  Take record to Dr. Para Skeans office.   10/08/17 1018     Follow-up Information    [name], MD Follow up on 10/15/2017.   Specialty:  Plastic Surgery Why:  10 15 am- note new time Contact information: [place] [address] [place] [address] [phone]        Stark Klein, MD Follow up in 2 week(s).   Specialty:  General Surgery Contact information: [address] [place] [address] [phone]           Signed: Stark Klein 10/08/2017, 10:22 AM

## 2017-10-08 NOTE — Progress Notes (Signed)
Discharge instructions reviewed with patient and husband. All questions answered. Observed husband successfully emptying JP drain. Pt rolled down to car with nurse tech and belongings.

## 2017-10-15 ENCOUNTER — Ambulatory Visit: Payer: Medicaid Other | Admitting: Nurse Practitioner

## 2017-10-15 ENCOUNTER — Inpatient Hospital Stay: Admission: RE | Admit: 2017-10-15 | Payer: Self-pay | Source: Ambulatory Visit | Admitting: Radiation Oncology

## 2017-10-17 ENCOUNTER — Ambulatory Visit: Payer: Medicaid Other | Admitting: Nurse Practitioner

## 2017-10-20 ENCOUNTER — Telehealth: Payer: Self-pay | Admitting: Nurse Practitioner

## 2017-10-20 NOTE — Telephone Encounter (Signed)
Called patient to reschedule missed appointment.

## 2017-10-21 ENCOUNTER — Telehealth: Payer: Self-pay | Admitting: Oncology

## 2017-10-21 ENCOUNTER — Ambulatory Visit (HOSPITAL_BASED_OUTPATIENT_CLINIC_OR_DEPARTMENT_OTHER): Payer: Medicaid Other

## 2017-10-21 ENCOUNTER — Ambulatory Visit (HOSPITAL_BASED_OUTPATIENT_CLINIC_OR_DEPARTMENT_OTHER): Payer: Medicaid Other | Admitting: Nurse Practitioner

## 2017-10-21 VITALS — BP 120/72 | HR 92 | Temp 98.0°F | Resp 17 | Ht 66.0 in | Wt 111.8 lb

## 2017-10-21 DIAGNOSIS — C7989 Secondary malignant neoplasm of other specified sites: Secondary | ICD-10-CM

## 2017-10-21 DIAGNOSIS — Z95828 Presence of other vascular implants and grafts: Secondary | ICD-10-CM | POA: Insufficient documentation

## 2017-10-21 DIAGNOSIS — Z452 Encounter for adjustment and management of vascular access device: Secondary | ICD-10-CM | POA: Diagnosis present

## 2017-10-21 DIAGNOSIS — C189 Malignant neoplasm of colon, unspecified: Secondary | ICD-10-CM

## 2017-10-21 MED ORDER — HEPARIN SOD (PORK) LOCK FLUSH 100 UNIT/ML IV SOLN
500.0000 [IU] | Freq: Once | INTRAVENOUS | Status: AC | PRN
  Administered 2017-10-21: 500 [IU] via INTRAVENOUS
  Filled 2017-10-21: qty 5

## 2017-10-21 MED ORDER — SODIUM CHLORIDE 0.9% FLUSH
10.0000 mL | INTRAVENOUS | Status: DC | PRN
  Administered 2017-10-21: 10 mL via INTRAVENOUS
  Filled 2017-10-21: qty 10

## 2017-10-21 NOTE — Telephone Encounter (Signed)
Gave avs and calendar for December and january °

## 2017-10-21 NOTE — Progress Notes (Addendum)
  [place] OFFICE PROGRESS NOTE   Diagnosis:  Colon cancer  INTERVAL HISTORY:   Yolanda Watson returns as scheduled. She underwent resection of the abdominal wall mass 10/03/2017. She feels the wound is healing well. She has pain associated with a left-sided abdominal drain. She reports good appetite. Bowels are moving.  She quit smoking 2 days ago.  Objective:  Vital signs in last 24 hours:  Blood pressure 120/72, pulse 92, temperature 98 F (36.7 C), temperature source Oral, resp. rate 17, height _0  (1.676 m), weight 111 lb 12.8 oz (50.7 kg), SpO2 100 %.    HEENT: no thrush or ulcers. Resp: lungs clear bilaterally. Cardio: regular rate and rhythm. GI: no hepatomegaly. Surgical incision and intact with staples. Left-sided abdominal drain site with mild erythema. Vascular: no leg edema.  Port-A-Cath without erythema.  Lab Results:  Lab Results  Component Value Date   WBC 5.2 10/07/2017   HGB 11.2 (L) 10/07/2017   HCT 33.7 (L) 10/07/2017   MCV 100.3 (H) 10/07/2017   PLT 189 10/07/2017   NEUTROABS 8.7 (H) 08/07/2017    Imaging:  No results found.  Medications: I have reviewed the patient's current medications.  Assessment/Plan: 1. Metastatic colon cancer presenting with an Abdominal/pelvic mass  2. [place]-pathology report dated 02/15/2015-invasive adenocarcinoma, well-differentiated with prominent mucinous features; microscopic tumor extension into mesenteric adipose tissue and focally involving visceral peritoneum; radial and mucosal margins negative; lymph-vascular invasion withsuspicious foci identified;perineural invasion not identified; 16 negative lymph nodes; tumor deposits not identified; pT4apN0;MSI stable  ? CT 04/22/2017 revealed a heterogenous anterior pelvic wall mass, right inguinal mass, top normal sized retroperitoneal and iliac nodes, abutment of the anterior dome of the bladder and a loop of  small bowel ? 04/29/2017 biopsy of anterior abdominal/pelvic wall mass-acellular mucin dissecting fibrous stroma ? Cycle 1 FOLFOX 05/15/2017 ? Cycle 2 FOLFOX 05/28/2017 ? Cycle 3 FOLFOX with Avastin on 06/11/2017 ? Cycle 4 FOLFOX/Avastin 06/25/2017 ? Cycle 5 FOLFOX/Avastin 07/10/2017, Neulasta added ? Restaging CT 07/21/2017-mild decrease in the size of a right inguinal node and the anterior abdominal wall mass ? Radiation to the abdominal wall mass 08/18/2017-09/03/2017 ? Status post excision of abdominal wall mass 10/03/2017--pathology showed adenocarcinoma with excessive extracellular mucin. Margins negative for tumor. Right inguinal wall mass also excised showing metastatic adenocarcinoma with excessive extracellular mucin in 1 of 8 lymph nodes.   3. Ongoing tobacco and alcohol use  4. Mild neutropenia related to chemotherapy 07/10/2017. Neulasta added.    Disposition: Yolanda Watson appears stable. She is recovering from the recent surgery. She continues follow-up with Dr. Barry Dienes and [name].   Dr. Benay Spice recommends observation with a CT scan at a four-month interval from surgery.  Port-A-Cath will be flushed today. She will return for the next port flush in 6 weeks. We will see her in follow-up in 3 months. She will contact the office in the interim with any problems.  We congratulated her on quitting smoking.  Patient seen with Dr. Benay Spice.  Ned Card ANP/GNP-BC   10/21/2017  3:02 PM This was a shared visit with Ned Card. Yolanda Watson was interviewed and examined. She underwent resection of the abdominal wall mass and right inguinal lymph node on 10/03/2017. She is recovering from surgery. Her case was presented at the GI tumor conference last week. The plan is to follow her with observation.  Julieanne Manson, M.D.

## 2017-11-14 ENCOUNTER — Ambulatory Visit (HOSPITAL_COMMUNITY): Payer: Medicaid Other | Admitting: Anesthesiology

## 2017-11-14 ENCOUNTER — Inpatient Hospital Stay (HOSPITAL_COMMUNITY)
Admission: RE | Admit: 2017-11-14 | Discharge: 2017-11-18 | DRG: 901 | Disposition: A | Discharge status: Home Health | Payer: Medicaid Other | Source: Ambulatory Visit | Attending: Plastic Surgery | Admitting: Plastic Surgery

## 2017-11-14 ENCOUNTER — Encounter (HOSPITAL_COMMUNITY): Payer: Self-pay

## 2017-11-14 ENCOUNTER — Encounter (HOSPITAL_COMMUNITY)
Admission: RE | Disposition: A | Discharge status: Home Health | Payer: Self-pay | Source: Ambulatory Visit | Attending: Plastic Surgery

## 2017-11-14 ENCOUNTER — Other Ambulatory Visit: Payer: Self-pay

## 2017-11-14 DIAGNOSIS — Z9221 Personal history of antineoplastic chemotherapy: Secondary | ICD-10-CM | POA: Diagnosis not present

## 2017-11-14 DIAGNOSIS — S31109A Unspecified open wound of abdominal wall, unspecified quadrant without penetration into peritoneal cavity, initial encounter: Secondary | ICD-10-CM | POA: Diagnosis present

## 2017-11-14 DIAGNOSIS — T8130XA Disruption of wound, unspecified, initial encounter: Secondary | ICD-10-CM | POA: Diagnosis present

## 2017-11-14 DIAGNOSIS — Z681 Body mass index (BMI) 19 or less, adult: Secondary | ICD-10-CM | POA: Diagnosis not present

## 2017-11-14 DIAGNOSIS — F17200 Nicotine dependence, unspecified, uncomplicated: Secondary | ICD-10-CM | POA: Diagnosis present

## 2017-11-14 DIAGNOSIS — I96 Gangrene, not elsewhere classified: Secondary | ICD-10-CM | POA: Diagnosis present

## 2017-11-14 DIAGNOSIS — C189 Malignant neoplasm of colon, unspecified: Secondary | ICD-10-CM | POA: Diagnosis present

## 2017-11-14 DIAGNOSIS — C779 Secondary and unspecified malignant neoplasm of lymph node, unspecified: Secondary | ICD-10-CM | POA: Diagnosis present

## 2017-11-14 DIAGNOSIS — E43 Unspecified severe protein-calorie malnutrition: Secondary | ICD-10-CM | POA: Diagnosis present

## 2017-11-14 HISTORY — PX: INCISION AND DRAINAGE OF WOUND: SHX1803

## 2017-11-14 LAB — CBC WITH DIFFERENTIAL/PLATELET
BASOS PCT: 0 %
Basophils Absolute: 0 10*3/uL (ref 0.0–0.1)
EOS ABS: 0.1 10*3/uL (ref 0.0–0.7)
EOS PCT: 1 %
HCT: 38.9 % (ref 36.0–46.0)
Hemoglobin: 12.8 g/dL (ref 12.0–15.0)
Lymphocytes Relative: 11 %
Lymphs Abs: 1.1 10*3/uL (ref 0.7–4.0)
MCH: 31 pg (ref 26.0–34.0)
MCHC: 32.9 g/dL (ref 30.0–36.0)
MCV: 94.2 fL (ref 78.0–100.0)
MONO ABS: 0.8 10*3/uL (ref 0.1–1.0)
MONOS PCT: 8 %
NEUTROS PCT: 80 %
Neutro Abs: 7.9 10*3/uL — ABNORMAL HIGH (ref 1.7–7.7)
PLATELETS: 326 10*3/uL (ref 150–400)
RBC: 4.13 MIL/uL (ref 3.87–5.11)
RDW: 14.3 % (ref 11.5–15.5)
WBC: 9.9 10*3/uL (ref 4.0–10.5)

## 2017-11-14 LAB — BASIC METABOLIC PANEL
Anion gap: 13 (ref 5–15)
BUN: 6 mg/dL (ref 6–20)
CHLORIDE: 98 mmol/L — AB (ref 101–111)
CO2: 22 mmol/L (ref 22–32)
CREATININE: 0.54 mg/dL (ref 0.44–1.00)
Calcium: 8.5 mg/dL — ABNORMAL LOW (ref 8.9–10.3)
GFR calc non Af Amer: >60 mL/min (ref >60)
Glucose, Bld: 105 mg/dL — ABNORMAL HIGH (ref 65–99)
POTASSIUM: 4.5 mmol/L (ref 3.5–5.1)
SODIUM: 133 mmol/L — AB (ref 135–145)

## 2017-11-14 SURGERY — IRRIGATION AND DEBRIDEMENT WOUND
Anesthesia: General

## 2017-11-14 MED ORDER — DEXAMETHASONE SODIUM PHOSPHATE 10 MG/ML IJ SOLN
INTRAMUSCULAR | Status: DC | PRN
  Administered 2017-11-14: 10 mg via INTRAVENOUS

## 2017-11-14 MED ORDER — ONDANSETRON HCL 4 MG/2ML IJ SOLN
INTRAMUSCULAR | Status: DC | PRN
  Administered 2017-11-14: 4 mg via INTRAVENOUS

## 2017-11-14 MED ORDER — ENOXAPARIN SODIUM 40 MG/0.4ML ~~LOC~~ SOLN
40.0000 mg | SUBCUTANEOUS | Status: DC
  Administered 2017-11-15 – 2017-11-18 (×4): 40 mg via SUBCUTANEOUS
  Filled 2017-11-14 (×4): qty 0.4

## 2017-11-14 MED ORDER — ACETAMINOPHEN 325 MG PO TABS: 650.0000 mg | ORAL_TABLET | Freq: Four times a day (QID) | ORAL | Status: DC | PRN

## 2017-11-14 MED ORDER — LACTATED RINGERS IV SOLN
INTRAVENOUS | Status: DC
  Administered 2017-11-14: 13:00:00 via INTRAVENOUS

## 2017-11-14 MED ORDER — POLYMYXIN B SULFATE 500000 UNITS IJ SOLR
INTRAMUSCULAR | Status: DC | PRN
  Administered 2017-11-14: 18:00:00

## 2017-11-14 MED ORDER — CLINDAMYCIN PHOSPHATE 900 MG/50ML IV SOLN
900.0000 mg | INTRAVENOUS | Status: AC
  Administered 2017-11-14: 900 mg via INTRAVENOUS

## 2017-11-14 MED ORDER — FENTANYL CITRATE (PF) 100 MCG/2ML IJ SOLN
INTRAMUSCULAR | Status: AC
  Filled 2017-11-14: qty 2

## 2017-11-14 MED ORDER — PHENYLEPHRINE HCL 10 MG/ML IJ SOLN
INTRAMUSCULAR | Status: DC | PRN
  Administered 2017-11-14: 20 ug/min via INTRAVENOUS

## 2017-11-14 MED ORDER — PROPOFOL 10 MG/ML IV BOLUS
INTRAVENOUS | Status: DC | PRN
  Administered 2017-11-14: 140 mg via INTRAVENOUS

## 2017-11-14 MED ORDER — OXYCODONE HCL 5 MG PO TABS
5.0000 mg | ORAL_TABLET | ORAL | Status: DC | PRN
  Administered 2017-11-14 – 2017-11-18 (×13): 10 mg via ORAL
  Filled 2017-11-14 (×12): qty 2

## 2017-11-14 MED ORDER — DEXAMETHASONE SODIUM PHOSPHATE 10 MG/ML IJ SOLN
INTRAMUSCULAR | Status: AC
Start: 2017-11-14 — End: 2017-11-14
  Filled 2017-11-14: qty 1

## 2017-11-14 MED ORDER — HYDROMORPHONE HCL 1 MG/ML IJ SOLN
0.5000 mg | INTRAMUSCULAR | Status: DC | PRN
  Administered 2017-11-14 – 2017-11-15 (×2): 1 mg via INTRAVENOUS
  Filled 2017-11-14 (×3): qty 1

## 2017-11-14 MED ORDER — HEPARIN SODIUM (PORCINE) 5000 UNIT/ML IJ SOLN
5000.0000 [IU] | Freq: Once | INTRAMUSCULAR | Status: AC
  Administered 2017-11-14: 5000 [IU] via SUBCUTANEOUS

## 2017-11-14 MED ORDER — FENTANYL CITRATE (PF) 100 MCG/2ML IJ SOLN
INTRAMUSCULAR | Status: DC | PRN
  Administered 2017-11-14 (×3): 50 ug via INTRAVENOUS

## 2017-11-14 MED ORDER — PROPOFOL 10 MG/ML IV BOLUS
INTRAVENOUS | Status: AC
  Filled 2017-11-14: qty 20

## 2017-11-14 MED ORDER — OXYCODONE HCL 5 MG PO TABS: 5.0000 mg | ORAL_TABLET | Freq: Once | ORAL | Status: DC | PRN

## 2017-11-14 MED ORDER — DAKINS (1/4 STRENGTH) 0.125 % EX SOLN
Freq: Two times a day (BID) | CUTANEOUS | Status: DC
  Filled 2017-11-14: qty 473

## 2017-11-14 MED ORDER — OXYCODONE HCL 5 MG/5ML PO SOLN: 5.0000 mg | Freq: Once | ORAL | Status: DC | PRN

## 2017-11-14 MED ORDER — DAKINS (1/4 STRENGTH) 0.125 % EX SOLN
Freq: Once | CUTANEOUS | Status: AC
  Administered 2017-11-14: 1
  Filled 2017-11-14: qty 473

## 2017-11-14 MED ORDER — CLINDAMYCIN PHOSPHATE 900 MG/50ML IV SOLN
INTRAVENOUS | Status: AC
  Filled 2017-11-14: qty 50

## 2017-11-14 MED ORDER — LIDOCAINE 2% (20 MG/ML) 5 ML SYRINGE
INTRAMUSCULAR | Status: DC | PRN
  Administered 2017-11-14: 60 mg via INTRAVENOUS

## 2017-11-14 MED ORDER — PHENYLEPHRINE 40 MCG/ML (10ML) SYRINGE FOR IV PUSH (FOR BLOOD PRESSURE SUPPORT)
PREFILLED_SYRINGE | INTRAVENOUS | Status: DC | PRN
  Administered 2017-11-14: 80 ug via INTRAVENOUS
  Administered 2017-11-14: 40 ug via INTRAVENOUS

## 2017-11-14 MED ORDER — HEPARIN SODIUM (PORCINE) 5000 UNIT/ML IJ SOLN
INTRAMUSCULAR | Status: AC
  Administered 2017-11-14: 5000 [IU] via SUBCUTANEOUS
  Filled 2017-11-14: qty 1

## 2017-11-14 MED ORDER — ONDANSETRON HCL 4 MG/2ML IJ SOLN: 4.0000 mg | Freq: Once | INTRAMUSCULAR | Status: DC | PRN

## 2017-11-14 MED ORDER — SUGAMMADEX SODIUM 200 MG/2ML IV SOLN
INTRAVENOUS | Status: DC | PRN
  Administered 2017-11-14: 100 mg via INTRAVENOUS
  Administered 2017-11-14: 200 mg via INTRAVENOUS

## 2017-11-14 MED ORDER — ONDANSETRON HCL 4 MG/2ML IJ SOLN
INTRAMUSCULAR | Status: AC
Start: 2017-11-14 — End: 2017-11-14
  Filled 2017-11-14: qty 2

## 2017-11-14 MED ORDER — ACETAMINOPHEN 650 MG RE SUPP: 650.0000 mg | Freq: Four times a day (QID) | RECTAL | Status: DC | PRN

## 2017-11-14 MED ORDER — MIDAZOLAM HCL 5 MG/5ML IJ SOLN
INTRAMUSCULAR | Status: DC | PRN
  Administered 2017-11-14: 1 mg via INTRAVENOUS

## 2017-11-14 MED ORDER — POTASSIUM CHLORIDE IN NACL 20-0.9 MEQ/L-% IV SOLN
INTRAVENOUS | Status: DC
  Administered 2017-11-14 – 2017-11-16 (×4): via INTRAVENOUS
  Filled 2017-11-14 (×4): qty 1000

## 2017-11-14 MED ORDER — CLINDAMYCIN PHOSPHATE 600 MG/50ML IV SOLN
600.0000 mg | Freq: Three times a day (TID) | INTRAVENOUS | Status: AC
  Administered 2017-11-15 (×3): 600 mg via INTRAVENOUS
  Filled 2017-11-14 (×3): qty 50

## 2017-11-14 MED ORDER — ONDANSETRON HCL 4 MG/2ML IJ SOLN: 4.0000 mg | Freq: Four times a day (QID) | INTRAMUSCULAR | Status: DC | PRN

## 2017-11-14 MED ORDER — MIDAZOLAM HCL 2 MG/2ML IJ SOLN
INTRAMUSCULAR | Status: AC
  Filled 2017-11-14: qty 2

## 2017-11-14 MED ORDER — LACTATED RINGERS IV SOLN
INTRAVENOUS | Status: DC | PRN
  Administered 2017-11-14: 17:00:00 via INTRAVENOUS

## 2017-11-14 MED ORDER — FENTANYL CITRATE (PF) 100 MCG/2ML IJ SOLN
25.0000 ug | INTRAMUSCULAR | Status: DC | PRN
Start: 2017-11-14 — End: 2017-11-14
  Administered 2017-11-14: 50 ug via INTRAVENOUS
  Administered 2017-11-14 (×2): 25 ug via INTRAVENOUS

## 2017-11-14 MED ORDER — OXYCODONE HCL 5 MG PO TABS
ORAL_TABLET | ORAL | Status: AC
  Filled 2017-11-14: qty 2

## 2017-11-14 MED ORDER — ROCURONIUM BROMIDE 10 MG/ML (PF) SYRINGE
PREFILLED_SYRINGE | INTRAVENOUS | Status: DC | PRN
  Administered 2017-11-14: 40 mg via INTRAVENOUS

## 2017-11-14 MED ORDER — FENTANYL CITRATE (PF) 250 MCG/5ML IJ SOLN
INTRAMUSCULAR | Status: AC
  Filled 2017-11-14: qty 5

## 2017-11-14 MED ORDER — ONDANSETRON 4 MG PO TBDP: 4.0000 mg | ORAL_TABLET | Freq: Four times a day (QID) | ORAL | Status: DC | PRN

## 2017-11-14 MED ORDER — 0.9 % SODIUM CHLORIDE (POUR BTL) OPTIME
TOPICAL | Status: DC | PRN
  Administered 2017-11-14: 1000 mL

## 2017-11-14 SURGICAL SUPPLY — 44 items
BAG DECANTER FOR FLEXI CONT (MISCELLANEOUS) IMPLANT
BLADE 10 SAFETY STRL DISP (BLADE) ×2 IMPLANT
BLADE CLIPPER SURG (BLADE) IMPLANT
BNDG GAUZE ELAST 4 BULKY (GAUZE/BANDAGES/DRESSINGS) ×2 IMPLANT
BNDG STRETCH 4X75 NS LF (GAUZE/BANDAGES/DRESSINGS) ×2 IMPLANT
CANISTER SUCT 3000ML PPV (MISCELLANEOUS) ×2 IMPLANT
CANISTER WOUNDNEG PRESSURE 500 (CANNISTER) ×2 IMPLANT
CHLORAPREP W/TINT 26ML (MISCELLANEOUS) IMPLANT
CONT SPEC 4OZ CLIKSEAL STRL BL (MISCELLANEOUS) IMPLANT
COVER SURGICAL LIGHT HANDLE (MISCELLANEOUS) ×2 IMPLANT
DRAPE HALF SHEET 40X57 (DRAPES) IMPLANT
DRAPE IMP U-DRAPE 54X76 (DRAPES) ×2 IMPLANT
DRAPE INCISE IOBAN 66X45 STRL (DRAPES) IMPLANT
DRAPE PED LAPAROTOMY (DRAPES) ×2 IMPLANT
DRSG ADAPTIC 3X8 NADH LF (GAUZE/BANDAGES/DRESSINGS) IMPLANT
DRSG PAD ABDOMINAL 8X10 ST (GAUZE/BANDAGES/DRESSINGS) ×4 IMPLANT
DRSG VAC ATS LRG SENSATRAC (GAUZE/BANDAGES/DRESSINGS) IMPLANT
DRSG VAC ATS MED SENSATRAC (GAUZE/BANDAGES/DRESSINGS) ×2 IMPLANT
DRSG VAC ATS SM SENSATRAC (GAUZE/BANDAGES/DRESSINGS) IMPLANT
ELECT CAUTERY BLADE 6.4 (BLADE) ×2 IMPLANT
ELECT REM PT RETURN 9FT ADLT (ELECTROSURGICAL) ×2
ELECTRODE REM PT RTRN 9FT ADLT (ELECTROSURGICAL) ×1 IMPLANT
GAUZE SPONGE 4X4 12PLY STRL (GAUZE/BANDAGES/DRESSINGS) ×2 IMPLANT
GLOVE BIO SURGEON STRL SZ 6 (GLOVE) ×2 IMPLANT
GLOVE SURG SS PI 6.0 STRL IVOR (GLOVE) ×2 IMPLANT
GOWN STRL REUS W/ TWL LRG LVL3 (GOWN DISPOSABLE) ×2 IMPLANT
GOWN STRL REUS W/TWL LRG LVL3 (GOWN DISPOSABLE) ×2
HANDPIECE INTERPULSE COAX TIP (DISPOSABLE)
KIT BASIN OR (CUSTOM PROCEDURE TRAY) ×2 IMPLANT
KIT ROOM TURNOVER OR (KITS) ×2 IMPLANT
NS IRRIG 1000ML POUR BTL (IV SOLUTION) ×2 IMPLANT
PACK GENERAL/GYN (CUSTOM PROCEDURE TRAY) ×2 IMPLANT
PACK UNIVERSAL I (CUSTOM PROCEDURE TRAY) ×2 IMPLANT
PAD ABD 8X10 STRL (GAUZE/BANDAGES/DRESSINGS) ×4 IMPLANT
PAD ARMBOARD 7.5X6 YLW CONV (MISCELLANEOUS) ×4 IMPLANT
SET HNDPC FAN SPRY TIP SCT (DISPOSABLE) IMPLANT
SURGILUBE 2OZ TUBE FLIPTOP (MISCELLANEOUS) IMPLANT
SUT VIC AB 5-0 PS2 18 (SUTURE) IMPLANT
SWAB COLLECTION DEVICE MRSA (MISCELLANEOUS) IMPLANT
SWAB CULTURE ESWAB REG 1ML (MISCELLANEOUS) IMPLANT
TAPE CLOTH SURG 4X10 WHT LF (GAUZE/BANDAGES/DRESSINGS) ×2 IMPLANT
TOWEL OR 17X24 6PK STRL BLUE (TOWEL DISPOSABLE) ×2 IMPLANT
TOWEL OR 17X26 10 PK STRL BLUE (TOWEL DISPOSABLE) ×2 IMPLANT
UNDERPAD 30X30 (UNDERPADS AND DIAPERS) ×2 IMPLANT

## 2017-11-14 NOTE — Op Note (Signed)
Operative Note   DATE OF OPERATION: 11.16.18  LOCATION: [place] OR-inpatient  SURGICAL DIVISION: Plastic Surgery  PREOPERATIVE DIAGNOSES:  1. Open wound abdomen with complication 2. History colon cancer metastatic to abdominal wall 3.History therapeutic radiation  POSTOPERATIVE DIAGNOSES:  same  PROCEDURE:  Excisional debridement skin, subcutaneous tissue, fascia, [name]   SURGEON: [name] MD MBA  ASSISTANT: none  ANESTHESIA:  General.   EBL: 30 ml  COMPLICATIONS: None immediate.   INDICATIONS FOR PROCEDURE:  The patient, Yolanda Watson, is a 66 y.o. female born on Sep 11, 1951, is here for debridement abdominal wall. She is 6 weeks postoperative from resection abdominal wall colon cancer metastasis, post neoadjvant chemoradiation. She experienced skin flap necrosis followed by exposure [name] and presents with draining wound.   FINDINGS: Exposure entirety of [name] with partial incorporation. All non incorporated or granulated [name] excised. Open wound 11 x 6 x 1 cm with circumferential undermining 3-5 cm.   DESCRIPTION OF PROCEDURE:  The patient was taken to the operating room. SCDs were placed and IV antibiotics were given. The patient's operative site was prepped and draped in a sterile fashion. A time out was performed and all information was confirmed to be correct. Sharp excision of necrotic skin edges, necrotic subcutaneous fat excised with scissors. All non incorporated or adherent [name] was excised. At inset to pubic symphysis bone curettage performed. Wound thoroughly irrigated. Intraoperative discussion of findings completed with Surgery service. Dakins dressing followed by dry dressing applied. Plan for Surgery exam of wound within next day and will place VAC following this.   The patient was allowed to wake from anesthesia, extubated and taken to the recovery room in satisfactory condition.   SPECIMENS: none  DRAINS: none  [name], MD  [facility] Plastic & Reconstructive Surgery [phone], pin [phone]

## 2017-11-14 NOTE — Anesthesia Preprocedure Evaluation (Signed)
Anesthesia Evaluation  Patient identified by MRN, date of birth, ID band Patient awake    Reviewed: Allergy & Precautions, NPO status , Patient's Chart, lab work & pertinent test results  Airway Mallampati: II  TM Distance: >3 FB Neck ROM: Full    Dental  (+) Teeth Intact, Dental Advisory Given   Pulmonary Current Smoker,    breath sounds clear to auscultation       Cardiovascular  Rhythm:Regular Rate:Normal     Neuro/Psych    GI/Hepatic   Endo/Other    Renal/GU      Musculoskeletal   Abdominal   Peds  Hematology   Anesthesia Other Findings   Reproductive/Obstetrics                             Anesthesia Physical Anesthesia Plan  ASA: II  Anesthesia Plan: General   Post-op Pain Management:    Induction: Intravenous  PONV Risk Score and Plan: Ondansetron and Dexamethasone  Airway Management Planned: Oral ETT  Additional Equipment:   Intra-op Plan:   Post-operative Plan: Extubation in OR  Informed Consent: I have reviewed the patients History and Physical, chart, labs and discussed the procedure including the risks, benefits and alternatives for the proposed anesthesia with the patient or authorized representative who has indicated his/her understanding and acceptance.     Dental advisory given  Plan Discussed with: CRNA and Anesthesiologist  Anesthesia Plan Comments:         Anesthesia Quick Evaluation  

## 2017-11-14 NOTE — Anesthesia Procedure Notes (Signed)
Procedure Name: Intubation Date/Time: 11/14/2017 5:40 PM Performed by: Imagene Riches, CRNA Pre-anesthesia Checklist: Patient identified, Emergency Drugs available, Suction available and Patient being monitored Patient Re-evaluated:Patient Re-evaluated prior to induction Oxygen Delivery Method: Circle System Utilized Preoxygenation: Pre-oxygenation with 100% oxygen Induction Type: IV induction Ventilation: Mask ventilation without difficulty Laryngoscope Size: Miller and 2 Grade View: Grade I Tube type: Oral Tube size: 7.0 mm Number of attempts: 1 Airway Equipment and Method: Stylet and Oral airway Placement Confirmation: ETT inserted through vocal cords under direct vision,  positive ETCO2 and breath sounds checked- equal and bilateral Secured at: 20 cm Tube secured with: Tape Dental Injury: Teeth and Oropharynx as per pre-operative assessment

## 2017-11-14 NOTE — H&P (Signed)
  Subjective:     Patient ID: Yolanda Watson is a 66 y.o. female.  HPI  6 weeks post op. Experienced necrosis/dehiscence skin flap with exposure [name]. Has obtained charity VAC, started 2 days ago. Notes alarm last evening VAC and told to turn off. This am note copious drainage from beneath VAC and removed, noted odor. Denies fever.  From initial consult: "The patient has a history of right colon cancer diagnosed 2016. She underwent a colectomy and temporary colostomy in [place], invasive adenocarcinoma, well-differentiated ; microscopic tumor extension into mesenteric adipose tissue and focally involving visceral peritoneum; radial and mucosal margins negative; lymph-vascular invasion withsuspicious foci identified;perineural invasion not identified; 16 negative lymph nodes; pT4apN0. She did not receive chemotherapy or radiation at that time. She underwent subsequent colostomy. She relocated to [facility], and presented in April 2018 after noting fullness at the lower aspect of her incision line. A CT on 04/22/2017 revealed a large heterogenous anterior pelvic wall mass, a smaller lesion in the right anterior inguinal area, rounded bilateral iliac chain lymph node measuring up to 36mm, a top-normal retroperitoneal nodes measuring approximately 9 mm in short axis is seen in the cavoatrial region. Biopsy on 04/29/2017 revealed the anterior abdominal/pelvic wall mass-acellular mucin dissecting fibrous stroma. She completed neoadjuvant chemotherapy  07/10/2017. A restaging CT on 07/21/2017 revealed mild decrease in the size of the right inginal node and the anterior abdominal wall mass was decreased to 9.3 x 12.4 x 11.3 cm, from previous 10.5 x 13.3 x 11.5 cm." Completed neoadjuvant chemoradiation.  Final pathology with adenocarcinoma abdominal wall, margins clear, right inguinal mass with 1/8 LN positive. Dr. Benay Spice recommends observation with a CT scan at a four-month interval from surgery as per GI  tumor board recommendation.  She denies smoking for 2 weeks.  Review of Systems     Objective:   Physical Exam  Cardiovascular: Normal rate and regular rhythm.   Pulmonary/Chest: Effort normal and breath sounds normal.  Neurological: She is alert.   Abd:  open wound infraumbilical abdomen present with exposure [name], open area 5 x 4.5 x 1 cm, undermining present several cm circumferential, unincorporated [name] is dissolving, copious thick drainage     Assessment:     Colon cancer metastatic to LN, abdominal wall Neoadjuvant chemoradiation S/p abdominal wall resection with [name] reconstruction    Plan:     Needs debridement and wash out abdominal wall. [name] not incorporating possible will encounter bowel.  OR today.  [name], MD [facility] Plastic & Reconstructive Surgery [phone], pin [phone]

## 2017-11-14 NOTE — Anesthesia Postprocedure Evaluation (Signed)
Anesthesia Post Note  Patient: Yolanda Watson  Procedure(s) Performed: IRRIGATION AND DEBRIDEMENT OF ABDOMINAL WALL (N/A )     Patient location during evaluation: PACU Anesthesia Type: General Level of consciousness: awake and alert Pain management: pain level controlled Vital Signs Assessment: post-procedure vital signs reviewed and stable Respiratory status: spontaneous breathing, nonlabored ventilation, respiratory function stable and patient connected to nasal cannula oxygen Cardiovascular status: blood pressure returned to baseline and stable Postop Assessment: no apparent nausea or vomiting Anesthetic complications: no    Last Vitals:  Vitals:   11/14/17 1935 11/14/17 1950  BP: 104/62 105/66  Pulse: 94 96  Resp: 14 14  Temp: (!) 36.3 C 36.8 C  SpO2: 97% 95%    Last Pain:  Vitals:   11/14/17 1950  TempSrc: Oral  PainSc:                  [name]

## 2017-11-14 NOTE — Transfer of Care (Signed)
Immediate Anesthesia Transfer of Care Note  Patient: Yolanda Watson  Procedure(s) Performed: IRRIGATION AND DEBRIDEMENT OF ABDOMINAL WALL (N/A )  Patient Location: PACU  Anesthesia Type:General  Level of Consciousness: awake, alert  and oriented  Airway & Oxygen Therapy: Patient Spontanous Breathing and Patient connected to nasal cannula oxygen  Post-op Assessment: Report given to RN and Post -op Vital signs reviewed and stable  Post vital signs: Reviewed and stable  Last Vitals:  Vitals:   11/14/17 1254  BP: 120/64  Pulse: (!) 104  Resp: 18  Temp: 36.8 C  SpO2: 98%    Last Pain:  Vitals:   11/14/17 1315  TempSrc:   PainSc: 8       Patients Stated Pain Goal: 5 (58/59/29 2446)  Complications: No apparent anesthesia complications

## 2017-11-15 ENCOUNTER — Encounter (HOSPITAL_COMMUNITY): Payer: Self-pay | Admitting: Plastic Surgery

## 2017-11-15 MED ORDER — POLYETHYLENE GLYCOL 3350 17 G PO PACK: 17.0000 g | PACK | Freq: Every day | ORAL | Status: DC | PRN

## 2017-11-15 MED ORDER — VITAMIN C 500 MG PO TABS
500.0000 mg | ORAL_TABLET | Freq: Every day | ORAL | Status: DC
  Administered 2017-11-15 – 2017-11-18 (×4): 500 mg via ORAL
  Filled 2017-11-15 (×4): qty 1

## 2017-11-15 MED ORDER — OMEGA-3-ACID ETHYL ESTERS 1 G PO CAPS
1.0000 g | ORAL_CAPSULE | Freq: Two times a day (BID) | ORAL | Status: DC
  Administered 2017-11-15 – 2017-11-18 (×7): 1 g via ORAL
  Filled 2017-11-15 (×7): qty 1

## 2017-11-15 MED ORDER — SENNOSIDES-DOCUSATE SODIUM 8.6-50 MG PO TABS
1.0000 | ORAL_TABLET | Freq: Every evening | ORAL | Status: DC | PRN
  Administered 2017-11-16: 1 via ORAL
  Filled 2017-11-15: qty 1

## 2017-11-15 MED ORDER — OCUVITE-LUTEIN PO CAPS
1.0000 | ORAL_CAPSULE | Freq: Every day | ORAL | Status: DC
  Administered 2017-11-15: 1 via ORAL
  Filled 2017-11-15 (×2): qty 1

## 2017-11-15 MED ORDER — ADULT MULTIVITAMIN W/MINERALS CH: 1.0000 | ORAL_TABLET | Freq: Every day | ORAL | Status: DC

## 2017-11-15 MED ORDER — ENSURE ENLIVE PO LIQD
237.0000 mL | Freq: Three times a day (TID) | ORAL | Status: DC
  Administered 2017-11-15 – 2017-11-18 (×7): 237 mL via ORAL

## 2017-11-15 NOTE — Progress Notes (Signed)
  Plastic Surgery  POD#1 debridement abdominal wall  Temp:  [97.4 F (36.3 C)-98.4 F (36.9 C)] 97.8 F (36.6 C) (11/17 0530) Pulse Rate:  [70-110] 70 (11/17 0530) Resp:  [14-20] 14 (11/17 0530) BP: (100-134)/(54-74) 100/54 (11/17 0530) SpO2:  [95 %-98 %] 96 % (11/17 0530) Weight:  [49.9 kg (110 lb)] 49.9 kg (110 lb) (11/16 1254)    PO 410 Reports last BM> 2 days  PE Wound with serous drainage,overall <25% granulation Abdomen soft, skin margins ischemic  A/P Surgery evaluating patient- patient had nearly all of bilateral rectus abdominus muscles resected with metastatic tumor, loss of domain  VAC applied today Dietician consult- has refused in past, counseled patient she requires supplementation as this will be prolonged time for wound healing, history radiation. Check BMP in am Bowel regimen   Patient case complicated by currently not insured, not eligible Medicare until 12.2018. We have obtained charity [facility] which she has at home, had attempted use prior to this admission for debridement. Would benefit from home health to assist with changes, will ask CM to see if able to receive this on charity basis until Medicare eligible.  [name], MD [facility] Plastic & Reconstructive Surgery [phone], pin [phone]

## 2017-11-15 NOTE — Progress Notes (Signed)
[facility] Progress Note  1 Day Post-Op  Subjective: CC: open abdominal wound Small amount abdominal pain. Denies n/v. Passing flatus.  VSS.   Objective: Vital signs in last 24 hours: Temp:  [97.4 F (36.3 C)-98.4 F (36.9 C)] 97.8 F (36.6 C) (11/17 0530) Pulse Rate:  [70-110] 70 (11/17 0530) Resp:  [14-20] 14 (11/17 0530) BP: (100-134)/(54-74) 100/54 (11/17 0530) SpO2:  [95 %-98 %] 96 % (11/17 0530) Weight:  [49.9 kg (110 lb)] 49.9 kg (110 lb) (11/16 1254) Last BM Date: 11/12/17  Intake/Output from previous day: 11/16 0701 - 11/17 0700 In: 1537.5 [P.O.:410; I.V.:1077.5; IV Piggyback:50] Out: 220 [Urine:200; Blood:20] Intake/Output this shift: No intake/output data recorded.  PE: Gen:  Alert, NAD, pleasant Card:  Regular rate and rhythm, pedal pulses 2+ BL Pulm:  Normal effort, clear to auscultation bilaterally Abd: Soft, minimally tender, non-distended, bowel sounds present, abdominal wound as below    Skin: warm and dry, no rashes  Psych: A&Ox3   Lab Results:  Recent Labs    11/14/17 1242  WBC 9.9  HGB 12.8  HCT 38.9  PLT 326   BMET Recent Labs    11/14/17 1242  NA 133*  K 4.5  CL 98*  CO2 22  GLUCOSE 105*  BUN 6  CREATININE 0.54  CALCIUM 8.5*   PT/INR No results for input(s): LABPROT, INR in the last 72 hours. CMP     Component Value Date/Time   NA 133 (L) 11/14/2017 1242   NA 138 08/07/2017 0835   K 4.5 11/14/2017 1242   K 4.1 08/07/2017 0835   CL 98 (L) 11/14/2017 1242   CO2 22 11/14/2017 1242   CO2 25 08/07/2017 0835   GLUCOSE 105 (H) 11/14/2017 1242   GLUCOSE 95 08/07/2017 0835   BUN 6 11/14/2017 1242   BUN 9.3 08/07/2017 0835   CREATININE 0.54 11/14/2017 1242   CREATININE 0.7 08/07/2017 0835   CALCIUM 8.5 (L) 11/14/2017 1242   CALCIUM 9.4 08/07/2017 0835   PROT 7.3 08/07/2017 0835   ALBUMIN 3.4 (L) 08/07/2017 0835   AST 25 08/07/2017 0835   ALT 21 08/07/2017 0835   ALKPHOS 138 08/07/2017 0835   BILITOT 0.49  08/07/2017 0835   GFRNONAA >60 11/14/2017 1242   GFRAA >60 11/14/2017 1242   Lipase     Component Value Date/Time   LIPASE 45 04/22/2017 1642       Studies/Results: No results found.  Anti-infectives: Anti-infectives (From admission, onward)   Start     Dose/Rate Route Frequency Ordered Stop   11/15/17 0600  clindamycin (CLEOCIN) IVPB 900 mg     900 mg 100 mL/hr over 30 Minutes Intravenous On call to O.R. 11/14/17 1241 11/14/17 1756   11/15/17 0100  clindamycin (CLEOCIN) IVPB 600 mg     600 mg 100 mL/hr over 30 Minutes Intravenous Every 8 hours 11/14/17 1959 11/16/17 0059   11/14/17 1752  polymyxin B 500,000 Units, bacitracin 50,000 Units in sodium chloride 0.9 % 500 mL irrigation  Status:  Discontinued       As needed 11/14/17 1752 11/14/17 1848   11/14/17 1253  clindamycin (CLEOCIN) 900 MG/50ML IVPB    Comments:  Tamsen Snider   : cabinet override      11/14/17 1253 11/14/17 1741       Assessment/Plan Hx of colon cancer with mets to abdominal wall Hx of therapeutic radiation  S/P Excision of malignant mass from abdominal wall - 10/03/17 Dr. Barry Dienes and [name]  Skin  flap necrosis, exposure of [name] S/P excisional debridement of skin, SQ, fascia, strattice - 11/14/17 [name] Open abdominal wound - no bowel evisceration - VAC to be placed today by [name]  FEN: regular VTE: SCDs ID: clindamycin (11/16>>)  LOS: 1 day    Brigid Re , [facility] 11/15/2017, 8:11 AM Pager: [phone] Consults: [phone] Mon-Fri 7:00 am-4:30 pm Sat-Sun 7:00 am-11:30 am

## 2017-11-15 NOTE — Progress Notes (Signed)
Initial Nutrition Assessment  DOCUMENTATION CODES:   Severe malnutrition in context of chronic illness  INTERVENTION:   Ensure Enlive po TID, each supplement provides 350 kcal and 20 grams of protein  Recommended for wound healing:  Vitamin C 594m po daily  Ocuvite po daily (provides zinc, copper, vitamin E, vitamin A, vitamin C)   Omega 3 fatty acid supplement daily  MVI daily (provides B vitamins and minerals- will not overdose pt in combination with the ocuvite)   NUTRITION DIAGNOSIS:   Severe Malnutrition related to cancer and cancer related treatments as evidenced by mild fat depletions in orbital regions and cheeks, moderate fat depletions in chest, severe fat depletion in arms, moderate to severe muscle depletions over entire body.  GOAL:   Patient will meet greater than or equal to 90% of their needs  MONITOR:   PO intake, Supplement acceptance, Labs, Weight trends, Skin  REASON FOR ASSESSMENT:   Consult Wound healing  ASSESSMENT:   66y/o female with history of right colon cancer diagnosed 2016. She [place], well-differentiated ; microscopic tumor extension into mesenteric adipose tissue and focally involving visceral peritoneum. A  CT on 04/22/2017 revealed a large heterogenous anterior pelvic wall mass, a smaller lesion in the right anterior inguinal area, rounded bilateral iliac chain lymph node measuring up to 873m a top-normal retroperitoneal nodes measuring approximately 9 mm in short axis is seen in the cavoatrial region. Biopsy on 04/29/2017 revealed the anterior abdominal/pelvic wall mass-acellular mucin dissecting fibrous stroma. She completed neoadjuvant chemotherapy  07/10/2017. A restaging CT on 07/21/2017 revealed mild decrease in the size of the right inginal node and the anterior abdominal wall mass was decreased to 9.3 x 12.4 x 11.3 cm, from previous 10.5 x 13.3 x 11.5 cm."  Completed neoadjuvant chemoradiation. Final pathology with adenocarcinoma abdominal wall, margins clear, right inguinal mass with 1/8 LN positive. S/p abdominal wall resection with [name] reconstruction 10/5 now with open wound infraumbilical abdomen present with exposure [name], open area 5 x 4.5 x 1 cm, undermining present several cm circumferential, unincorporated [name] is dissolving, copious thick drainage. [name], SQ, fascia, strattice - 11/14/17 Dr. ThIran Planas Met with pt in room today. Pt with large non healing open abdominal wound on VAC. Pt reports that she used to weigh 156lbs several years ago but then she started to eat healthier and eliminate junk foods and lost down to 110lbs. Pt reports that her UBW is around 108lbs and that she has remained the same size now for some time. Pt avoids additives and preservatives and because of this has refused supplements in the past. RD feels as though pt may be over-restricting certain foods in an attempt to "stay healthy and keep my weight down". Per chart, pt weighed 122lbs in May, but has remained weight stable from 110-116lbs since June. RD discussed with pt the importance of protein and adequate nutrition needed for wound healing. Pt reports understanding and agrees to drink supplements and take recommend vitamins needed for healing. RD also discussed the importance of adequate fat intake for wound healing and provided pt with examples of healthy fats that would provide the essential fatty acids needed for healing. Pt ate 100% of her breakfast today and drank an Ensure. RD will order supplements and MVIs.     Medications reviewed and include: lovenox, NaCl w/ KCl _0 /hr, clindamycin, oxycodone  Labs reviewed: Na 133(L), Cl 98(L), Ca 8.5(L)  Nutrition-Focused  physical exam completed. Findings are mild fat depletions in orbital regions and cheeks, moderate fat depletions in chest, severe fat depletion in arms,  moderate to severe muscle depletions over entire body, and no edema  Diet Order:  Diet regular Room service appropriate? Yes; Fluid consistency: Thin  EDUCATION NEEDS:   Education needs have been addressed  Skin:  Skin Assessment: (open abdominal wound with VAC)  Last BM:  11/14  Height:   Ht Readings from Last 1 Encounters:  11/14/17 5' 6.5" (1.689 m)    Weight:   Wt Readings from Last 1 Encounters:  11/14/17 110 lb (49.9 kg)    Ideal Body Weight:  60.3 kg  BMI:  Body mass index is 17.49 kg/m.  Estimated Nutritional Needs:   Kcal:  1500-1700kcal/day   Protein:  90-100g/day   Fluid:  >1.5L/day   Yolanda Distance MS, RD, LDN Pager #[phone] After Hours Pager: [phone]

## 2017-11-15 NOTE — Care Management Note (Addendum)
Case Management Note  Patient Details  Name: Yolanda Watson MRN: [id number] Date of Birth: March 19, 1951  Subjective/Objective: 66 y.o. F, POD #1 I/D Abdominal Wall. CM received consult for [facility] R.N.  to assist with "Grand" until pt has Insurance Dec 2018. CM placed call to [facility], rep for [facility] to inquire about same. Will follow.  Brad with [facility] has approved pt for [facility].                   Action/Plan:CM will follow closely for disposition/discharge needs.    Expected Discharge Date:  11/17/17               Expected Discharge Plan:     In-House Referral:     Discharge planning Services  CM Consult, Other - See comment  Post Acute Care Choice:  Home Health Choice offered to:     DME Arranged:    DME Agency:     HH Arranged:    HH Agency:     Status of Service:  In process, will continue to follow  If discussed at Long Length of Stay Meetings, dates discussed:    Additional Comments:  Delrae Sawyers, RN 11/15/2017, 9:11 AM

## 2017-11-15 NOTE — Progress Notes (Signed)
Healing incision site from removal of JP drain

## 2017-11-16 LAB — BASIC METABOLIC PANEL
ANION GAP: 6 (ref 5–15)
BUN: 13 mg/dL (ref 6–20)
CHLORIDE: 103 mmol/L (ref 101–111)
CO2: 29 mmol/L (ref 22–32)
CREATININE: 0.53 mg/dL (ref 0.44–1.00)
Calcium: 7.9 mg/dL — ABNORMAL LOW (ref 8.9–10.3)
GFR calc non Af Amer: >60 mL/min (ref >60)
Glucose, Bld: 120 mg/dL — ABNORMAL HIGH (ref 65–99)
POTASSIUM: 4.6 mmol/L (ref 3.5–5.1)
SODIUM: 138 mmol/L (ref 135–145)

## 2017-11-16 MED ORDER — PROSIGHT PO TABS
1.0000 | ORAL_TABLET | Freq: Every day | ORAL | Status: DC
  Administered 2017-11-16 – 2017-11-18 (×3): 1 via ORAL
  Filled 2017-11-16 (×3): qty 1

## 2017-11-16 MED ORDER — SODIUM CHLORIDE 0.9% FLUSH: 10.0000 mL | INTRAVENOUS | Status: DC | PRN

## 2017-11-16 NOTE — Progress Notes (Addendum)
  Plastic Surgery  POD#2 debridement abdominal wall Post op abdominal wall colon cancer metastasis resection with [name] reconstruction (10.5.18)  Temp:  [97.7 F (36.5 C)-98 F (36.7 C)] 98 F (36.7 C) (11/18 0559) Pulse Rate:  [80-82] 81 (11/18 0559) Resp:  [17-18] 18 (11/18 0559) BP: (99-114)/(50-68) 114/68 (11/18 0559) SpO2:  [94 %-98 %] 98 % (11/18 0559)    PO 517 Reports last BM> 3 days Port accessed this am  PE Wound with VAC in place holding suction The VAC drainage is >100 ml over day with thick yellow fluid Abdomen soft, skin margins ischemic  A/P Clinically stable, no BM- she did not receive either of the PRN bowel meds yesterday, plan order for today.  The VAC drainage remains the similar thick fluid significant amount- plan VAC change 11.19 or 11.20. I would hope that this drainage lessens and becomes more serous before she is stable for discharge, ensure she does not need further debridement.  [name], MD [facility] Plastic & Reconstructive Surgery [phone], pin [phone]

## 2017-11-16 NOTE — Progress Notes (Signed)
[facility] Progress Note  2 Days Post-Op  Subjective: CC: pain in thighs  Patient states her pain is the worst in her thighs. Has some mild abdominal pain at times, but it goes away. Tolerating diet, and had 3 ensure yesterday. Passing gas but no flatus. IV in left had got infiltrated yesterday, but hand is feeling better now that IV has been removed.   Objective: Vital signs in last 24 hours: Temp:  [97.7 F (36.5 C)-98 F (36.7 C)] 98 F (36.7 C) (11/18 0559) Pulse Rate:  [74-82] 81 (11/18 0559) Resp:  [16-18] 18 (11/18 0559) BP: (99-114)/(45-68) 114/68 (11/18 0559) SpO2:  [94 %-98 %] 98 % (11/18 0559) Last BM Date: 11/15/17  Intake/Output from previous day: 11/17 0701 - 11/18 0700 In: 3268.8 [P.O.:360; I.V.:2907.8] Out: 1250 [Urine:1250] Intake/Output this shift: No intake/output data recorded.  PE: Gen:  Alert, NAD, pleasant Card:  Regular rate and rhythm, pedal pulses 2+ BL Pulm:  Normal effort, clear to auscultation bilaterally Abd: Soft, appropriately tender, non-distended, bowel sounds present, midline wound with VAC present and some duskiness around skin edges Ext: left had with mild swelling, no erythema, not TTP Skin: warm and dry, no rashes  Psych: A&Ox3   Lab Results:  Recent Labs    11/14/17 1242  WBC 9.9  HGB 12.8  HCT 38.9  PLT 326   BMET Recent Labs    11/14/17 1242 11/16/17 0355  NA 133* 138  K 4.5 4.6  CL 98* 103  CO2 22 29  GLUCOSE 105* 120*  BUN 6 13  CREATININE 0.54 0.53  CALCIUM 8.5* 7.9*   PT/INR No results for input(s): LABPROT, INR in the last 72 hours. CMP     Component Value Date/Time   NA 138 11/16/2017 0355   NA 138 08/07/2017 0835   K 4.6 11/16/2017 0355   K 4.1 08/07/2017 0835   CL 103 11/16/2017 0355   CO2 29 11/16/2017 0355   CO2 25 08/07/2017 0835   GLUCOSE 120 (H) 11/16/2017 0355   GLUCOSE 95 08/07/2017 0835   BUN 13 11/16/2017 0355   BUN 9.3 08/07/2017 0835   CREATININE 0.53 11/16/2017 0355    CREATININE 0.7 08/07/2017 0835   CALCIUM 7.9 (L) 11/16/2017 0355   CALCIUM 9.4 08/07/2017 0835   PROT 7.3 08/07/2017 0835   ALBUMIN 3.4 (L) 08/07/2017 0835   AST 25 08/07/2017 0835   ALT 21 08/07/2017 0835   ALKPHOS 138 08/07/2017 0835   BILITOT 0.49 08/07/2017 0835   GFRNONAA >60 11/16/2017 0355   GFRAA >60 11/16/2017 0355   Lipase     Component Value Date/Time   LIPASE 45 04/22/2017 1642       Studies/Results: No results found.  Anti-infectives: Anti-infectives (From admission, onward)   Start     Dose/Rate Route Frequency Ordered Stop   11/15/17 0600  clindamycin (CLEOCIN) IVPB 900 mg     900 mg 100 mL/hr over 30 Minutes Intravenous On call to O.R. 11/14/17 1241 11/14/17 1756   11/15/17 0100  clindamycin (CLEOCIN) IVPB 600 mg     600 mg 100 mL/hr over 30 Minutes Intravenous Every 8 hours 11/14/17 1959 11/15/17 1730   11/14/17 1752  polymyxin B 500,000 Units, bacitracin 50,000 Units in sodium chloride 0.9 % 500 mL irrigation  Status:  Discontinued       As needed 11/14/17 1752 11/14/17 1848   11/14/17 1253  clindamycin (CLEOCIN) 900 MG/50ML IVPB    Comments:  Tamsen Snider   :  cabinet override      11/14/17 1253 11/14/17 1741       Assessment/Plan Hx of colon cancer with mets to abdominal wall Hx of therapeutic radiation  S/P Excision of malignant mass from abdominal wall - 10/03/17 Dr. Barry Dienes and [name]  Skin flap necrosis, exposure of [name] S/P excisional debridement of skin, SQ, fascia, strattice - 11/14/17 [name] Open abdominal wound - no bowel evisceration seen yesterday - VAC placed yesterday by [name]  FEN: regular VTE: SCDs ID: clindamycin (11/16>>)  Continue VAC per [name], continue ensure. Dr. Barry Dienes to follow starting tomorrow.   LOS: 2 days    Brigid Re , [facility] 11/16/2017, 7:20 AM Pager: [phone] Consults: [phone] Mon-Fri 7:00 am-4:30 pm Sat-Sun 7:00 am-11:30  am
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
## 2017-11-17 NOTE — Care Management Note (Signed)
Case Management Note  Patient Details  Name: TIERANY APPLEBY MRN: [id number] Date of Birth: 16-Oct-1951  Subjective/Objective:                    Action/Plan:  Patient has VAC at home husband to bring it in today.  Expected Discharge Date:  11/17/17               Expected Discharge Plan:  [place]  In-House Referral:     Discharge planning Services  CM Consult, Other - See comment  Post Acute Care Choice:  Home Health Choice offered to:  Patient  DME Arranged:    DME Agency:     HH Arranged:  RN [place] Agency:  [place]  Status of Service:  Completed, signed off  If discussed at [place] of Stay Meetings, dates discussed:    Additional Comments:  Marilu Favre, RN 11/17/2017, 9:35 AM
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
## 2017-11-18 MED ORDER — OXYCODONE HCL 5 MG PO TABS: 5.0000 mg | ORAL_TABLET | ORAL | 0 refills | Status: DC | PRN

## 2017-11-18 MED ORDER — HEPARIN SOD (PORK) LOCK FLUSH 100 UNIT/ML IV SOLN
500.0000 [IU] | INTRAVENOUS | Status: AC | PRN
  Administered 2017-11-18: 500 [IU]

## 2017-11-18 NOTE — Discharge Summary (Signed)
Physician Discharge Summary  Patient ID: Yolanda Watson MRN: [id number] DOB/AGE: 02/20/1951 66 y.o.  Admit date: 11/14/2017 Discharge date: 11/18/2017  Admission Diagnoses: Colon cancer metastatic to abdominal wall, open wound abdomen  Discharge Diagnoses:  same  Discharged Condition: stable  Hospital Course: Patient 6 weeks post operative from abdominal wall resection metastatic tumor and [name] abdominal wall reconstruction. She developed skin flap necrosis with exposure [name]. The patient was admitted for debridement as [name] no incorporated. Postoperatively evaluated by Surgery. Wound VAC reapplied and progress noted in hospital. Remained afebrile with normal bowel function. VAC and home health arranged.   Consults: general surgery, dietician, OT  Treatments: surgery: debridement abdominal wall 11.16.18  Discharge Exam: Blood pressure (!) 99/58, pulse 90, temperature 98.1 F (36.7 C), resp. rate 17, height 5' 6.5" (1.689 m), weight 49.9 kg (110 lb), SpO2 96 %. Incision/Wound: VAC in place abdomen soft  Disposition: 01-Home or Self Care  Discharge Instructions    Call MD for:  redness, tenderness, or signs of infection (pain, swelling, bleeding, redness, odor or green/yellow discharge around incision site)   Complete by:  As directed    Call MD for:  temperature >100.5   Complete by:  As directed    Discharge instructions   Complete by:  As directed    Sponge bathe. Keep VAC intact dry.   VAC to 125 mm Hg continuous. VAC change three times per week: apply Adaptic to base wound (supplied to patient) and apply black foam.  Recommend multivitamin, protein supplementation.   Driving Restrictions   Complete by:  As directed    No driving if taking narcotics   Resume previous diet   Complete by:  As directed       Follow-up Information    [facility] Follow up.   Specialty:  Home Health Services Why:  Home Health [facility]) has been  Forensic psychologist information: [place] [id number] [phone]        [name], MD Follow up on 11/26/2017.   Specialty:  Plastic Surgery Why:  003 am Contact information: [place] [address] [address] [phone]           Signed: [name] 11/18/2017, 8:19 AM

## 2017-11-18 NOTE — Care Management Note (Signed)
Case Management Note  Patient Details  Name: Yolanda Watson MRN: [id number] Date of Birth: [birth year]  Subjective/Objective:                    Action/Plan:   Expected Discharge Date:  11/18/17               Expected Discharge Plan:  [place]  In-House Referral:     Discharge planning Services  CM Consult, Other - See comment  Post Acute Care Choice:  Home Health Choice offered to:  Patient  DME Arranged:    DME Agency:     HH Arranged:  RN Lenora Agency:  [place]  Status of Service:  Completed, signed off  If discussed at [place] of Stay Meetings, dates discussed:    Additional Comments:  Marilu Favre, RN 11/18/2017, 10:36 AM

## 2017-11-18 NOTE — Progress Notes (Signed)
Discharge instructions given. Pt verbalized understanding and all questions were answered.  

## 2017-11-28 ENCOUNTER — Telehealth: Payer: Self-pay

## 2017-11-28 NOTE — Telephone Encounter (Signed)
Pt called to cancel flush appt on 12/4. She was flushed on 11/20 with surgery. Next flush 01/19/17. appt cancelled.

## 2017-11-28 NOTE — Addendum Note (Signed)
Addendum  created 11/28/17 1720 by Oleta Mouse, MD   Intraprocedure Staff edited

## 2017-12-30 ENCOUNTER — Inpatient Hospital Stay (HOSPITAL_COMMUNITY)
Admission: EM | Admit: 2017-12-30 | Discharge: 2018-01-06 | DRG: 908 | Disposition: A | Discharge status: Skilled Nursing Facility | Payer: Medicare Other | Attending: Internal Medicine | Admitting: Internal Medicine

## 2017-12-30 ENCOUNTER — Emergency Department (HOSPITAL_COMMUNITY): Payer: Medicare Other

## 2017-12-30 ENCOUNTER — Encounter (HOSPITAL_COMMUNITY): Payer: Self-pay | Admitting: Emergency Medicine

## 2017-12-30 ENCOUNTER — Other Ambulatory Visit: Payer: Self-pay

## 2017-12-30 DIAGNOSIS — Z91040 Latex allergy status: Secondary | ICD-10-CM | POA: Diagnosis not present

## 2017-12-30 DIAGNOSIS — M868X8 Other osteomyelitis, other site: Secondary | ICD-10-CM | POA: Diagnosis not present

## 2017-12-30 DIAGNOSIS — Z9221 Personal history of antineoplastic chemotherapy: Secondary | ICD-10-CM

## 2017-12-30 DIAGNOSIS — F17210 Nicotine dependence, cigarettes, uncomplicated: Secondary | ICD-10-CM | POA: Diagnosis present

## 2017-12-30 DIAGNOSIS — F419 Anxiety disorder, unspecified: Secondary | ICD-10-CM | POA: Diagnosis present

## 2017-12-30 DIAGNOSIS — M00851 Arthritis due to other bacteria, right hip: Secondary | ICD-10-CM | POA: Diagnosis not present

## 2017-12-30 DIAGNOSIS — K5900 Constipation, unspecified: Secondary | ICD-10-CM | POA: Diagnosis not present

## 2017-12-30 DIAGNOSIS — Z66 Do not resuscitate: Secondary | ICD-10-CM | POA: Diagnosis present

## 2017-12-30 DIAGNOSIS — M25559 Pain in unspecified hip: Secondary | ICD-10-CM | POA: Diagnosis not present

## 2017-12-30 DIAGNOSIS — M79604 Pain in right leg: Secondary | ICD-10-CM | POA: Diagnosis not present

## 2017-12-30 DIAGNOSIS — M86159 Other acute osteomyelitis, unspecified femur: Secondary | ICD-10-CM | POA: Diagnosis not present

## 2017-12-30 DIAGNOSIS — Y838 Other surgical procedures as the cause of abnormal reaction of the patient, or of later complication, without mention of misadventure at the time of the procedure: Secondary | ICD-10-CM | POA: Diagnosis present

## 2017-12-30 DIAGNOSIS — Z792 Long term (current) use of antibiotics: Secondary | ICD-10-CM | POA: Diagnosis not present

## 2017-12-30 DIAGNOSIS — M869 Osteomyelitis, unspecified: Secondary | ICD-10-CM | POA: Diagnosis present

## 2017-12-30 DIAGNOSIS — C774 Secondary and unspecified malignant neoplasm of inguinal and lower limb lymph nodes: Secondary | ICD-10-CM | POA: Diagnosis present

## 2017-12-30 DIAGNOSIS — Z880 Allergy status to penicillin: Secondary | ICD-10-CM | POA: Diagnosis not present

## 2017-12-30 DIAGNOSIS — M009 Pyogenic arthritis, unspecified: Secondary | ICD-10-CM | POA: Diagnosis present

## 2017-12-30 DIAGNOSIS — R100 Acute abdomen: Secondary | ICD-10-CM | POA: Diagnosis not present

## 2017-12-30 DIAGNOSIS — T8149XA Infection following a procedure, other surgical site, initial encounter: Secondary | ICD-10-CM

## 2017-12-30 DIAGNOSIS — R1030 Lower abdominal pain, unspecified: Secondary | ICD-10-CM | POA: Diagnosis not present

## 2017-12-30 DIAGNOSIS — Z483 Aftercare following surgery for neoplasm: Secondary | ICD-10-CM | POA: Diagnosis not present

## 2017-12-30 DIAGNOSIS — C182 Malignant neoplasm of ascending colon: Secondary | ICD-10-CM | POA: Diagnosis present

## 2017-12-30 DIAGNOSIS — C189 Malignant neoplasm of colon, unspecified: Secondary | ICD-10-CM | POA: Diagnosis not present

## 2017-12-30 DIAGNOSIS — Z923 Personal history of irradiation: Secondary | ICD-10-CM | POA: Diagnosis not present

## 2017-12-30 DIAGNOSIS — B951 Streptococcus, group B, as the cause of diseases classified elsewhere: Secondary | ICD-10-CM | POA: Diagnosis present

## 2017-12-30 DIAGNOSIS — R262 Difficulty in walking, not elsewhere classified: Secondary | ICD-10-CM | POA: Diagnosis not present

## 2017-12-30 DIAGNOSIS — G893 Neoplasm related pain (acute) (chronic): Secondary | ICD-10-CM | POA: Diagnosis present

## 2017-12-30 DIAGNOSIS — M8618 Other acute osteomyelitis, other site: Secondary | ICD-10-CM | POA: Diagnosis present

## 2017-12-30 DIAGNOSIS — M6281 Muscle weakness (generalized): Secondary | ICD-10-CM | POA: Diagnosis not present

## 2017-12-30 DIAGNOSIS — T8189XA Other complications of procedures, not elsewhere classified, initial encounter: Principal | ICD-10-CM | POA: Diagnosis present

## 2017-12-30 DIAGNOSIS — Z515 Encounter for palliative care: Secondary | ICD-10-CM | POA: Diagnosis not present

## 2017-12-30 DIAGNOSIS — L02211 Cutaneous abscess of abdominal wall: Secondary | ICD-10-CM

## 2017-12-30 DIAGNOSIS — Z85038 Personal history of other malignant neoplasm of large intestine: Secondary | ICD-10-CM

## 2017-12-30 DIAGNOSIS — Z9049 Acquired absence of other specified parts of digestive tract: Secondary | ICD-10-CM

## 2017-12-30 DIAGNOSIS — S31109A Unspecified open wound of abdominal wall, unspecified quadrant without penetration into peritoneal cavity, initial encounter: Secondary | ICD-10-CM | POA: Diagnosis not present

## 2017-12-30 DIAGNOSIS — K5903 Drug induced constipation: Secondary | ICD-10-CM | POA: Diagnosis not present

## 2017-12-30 DIAGNOSIS — M79606 Pain in leg, unspecified: Secondary | ICD-10-CM | POA: Diagnosis not present

## 2017-12-30 DIAGNOSIS — R488 Other symbolic dysfunctions: Secondary | ICD-10-CM | POA: Diagnosis not present

## 2017-12-30 DIAGNOSIS — Z7189 Other specified counseling: Secondary | ICD-10-CM | POA: Diagnosis not present

## 2017-12-30 DIAGNOSIS — T8131XA Disruption of external operation (surgical) wound, not elsewhere classified, initial encounter: Secondary | ICD-10-CM | POA: Diagnosis not present

## 2017-12-30 DIAGNOSIS — T8141XA Infection following a procedure, superficial incisional surgical site, initial encounter: Secondary | ICD-10-CM | POA: Diagnosis not present

## 2017-12-30 DIAGNOSIS — T402X5A Adverse effect of other opioids, initial encounter: Secondary | ICD-10-CM | POA: Diagnosis not present

## 2017-12-30 DIAGNOSIS — M25551 Pain in right hip: Secondary | ICD-10-CM | POA: Diagnosis not present

## 2017-12-30 LAB — COMPREHENSIVE METABOLIC PANEL
ALT: 18 U/L (ref 14–54)
AST: 23 U/L (ref 15–41)
Albumin: 2.5 g/dL — ABNORMAL LOW (ref 3.5–5.0)
Alkaline Phosphatase: 128 U/L — ABNORMAL HIGH (ref 38–126)
Anion gap: 14 (ref 5–15)
BUN: 14 mg/dL (ref 6–20)
CO2: 25 mmol/L (ref 22–32)
Calcium: 8.3 mg/dL — ABNORMAL LOW (ref 8.9–10.3)
Chloride: 96 mmol/L — ABNORMAL LOW (ref 101–111)
Creatinine, Ser: 0.55 mg/dL (ref 0.44–1.00)
GFR calc Af Amer: >60 mL/min (ref >60)
GFR calc non Af Amer: >60 mL/min (ref >60)
Glucose, Bld: 115 mg/dL — ABNORMAL HIGH (ref 65–99)
Potassium: 3.6 mmol/L (ref 3.5–5.1)
Sodium: 135 mmol/L (ref 135–145)
Total Bilirubin: 0.5 mg/dL (ref 0.3–1.2)
Total Protein: 6.2 g/dL — ABNORMAL LOW (ref 6.5–8.1)

## 2017-12-30 LAB — CBC WITH DIFFERENTIAL/PLATELET
Basophils Absolute: 0 10*3/uL (ref 0.0–0.1)
Basophils Relative: 0 %
Eosinophils Absolute: 0 10*3/uL (ref 0.0–0.7)
Eosinophils Relative: 0 %
HCT: 33.4 % — ABNORMAL LOW (ref 36.0–46.0)
Hemoglobin: 10.3 g/dL — ABNORMAL LOW (ref 12.0–15.0)
Lymphocytes Relative: 9 %
Lymphs Abs: 1.1 10*3/uL (ref 0.7–4.0)
MCH: 27.3 pg (ref 26.0–34.0)
MCHC: 30.8 g/dL (ref 30.0–36.0)
MCV: 88.6 fL (ref 78.0–100.0)
Monocytes Absolute: 0.5 10*3/uL (ref 0.1–1.0)
Monocytes Relative: 4 %
Neutro Abs: 11.2 10*3/uL — ABNORMAL HIGH (ref 1.7–7.7)
Neutrophils Relative %: 87 %
Platelets: 363 10*3/uL (ref 150–400)
RBC: 3.77 MIL/uL — ABNORMAL LOW (ref 3.87–5.11)
RDW: 16.2 % — ABNORMAL HIGH (ref 11.5–15.5)
WBC: 12.8 10*3/uL — ABNORMAL HIGH (ref 4.0–10.5)

## 2017-12-30 LAB — URINALYSIS, ROUTINE W REFLEX MICROSCOPIC
Bilirubin Urine: NEGATIVE
Glucose, UA: NEGATIVE mg/dL
Hgb urine dipstick: NEGATIVE
Ketones, ur: 5 mg/dL — AB
Leukocytes, UA: NEGATIVE
Nitrite: NEGATIVE
Protein, ur: NEGATIVE mg/dL
Specific Gravity, Urine: 1.012 (ref 1.005–1.030)
pH: 7 (ref 5.0–8.0)

## 2017-12-30 LAB — I-STAT CG4 LACTIC ACID, ED: Lactic Acid, Venous: 1.26 mmol/L (ref 0.5–1.9)

## 2017-12-30 MED ORDER — LORAZEPAM 2 MG/ML IJ SOLN: 1.0000 mg | INTRAMUSCULAR | Status: DC | PRN

## 2017-12-30 MED ORDER — SODIUM CHLORIDE 0.9% FLUSH
10.0000 mL | INTRAVENOUS | Status: DC | PRN
  Administered 2017-12-31: 20 mL
  Administered 2018-01-01 – 2018-01-06 (×2): 10 mL
  Filled 2017-12-30 (×3): qty 40

## 2017-12-30 MED ORDER — IOPAMIDOL (ISOVUE-300) INJECTION 61%
INTRAVENOUS | Status: AC
  Administered 2017-12-30: 100 mL
  Filled 2017-12-30: qty 100

## 2017-12-30 MED ORDER — ORAL CARE MOUTH RINSE
15.0000 mL | Freq: Two times a day (BID) | OROMUCOSAL | Status: DC
  Administered 2018-01-01 – 2018-01-02 (×2): 15 mL via OROMUCOSAL

## 2017-12-30 MED ORDER — SODIUM CHLORIDE 0.9% FLUSH
10.0000 mL | Freq: Two times a day (BID) | INTRAVENOUS | Status: DC
  Administered 2018-01-01 – 2018-01-03 (×3): 10 mL

## 2017-12-30 MED ORDER — ONDANSETRON HCL 4 MG/2ML IJ SOLN: 4.0000 mg | Freq: Four times a day (QID) | INTRAMUSCULAR | Status: DC | PRN

## 2017-12-30 MED ORDER — ACETAMINOPHEN 650 MG RE SUPP: 650.0000 mg | Freq: Four times a day (QID) | RECTAL | Status: DC | PRN

## 2017-12-30 MED ORDER — ACETAMINOPHEN 325 MG PO TABS: 650.0000 mg | ORAL_TABLET | Freq: Four times a day (QID) | ORAL | Status: DC | PRN

## 2017-12-30 MED ORDER — VANCOMYCIN HCL IN DEXTROSE 1-5 GM/200ML-% IV SOLN
1000.0000 mg | Freq: Once | INTRAVENOUS | Status: AC
  Administered 2017-12-30: 1000 mg via INTRAVENOUS
  Filled 2017-12-30: qty 200

## 2017-12-30 MED ORDER — SODIUM CHLORIDE 0.9 % IV BOLUS (SEPSIS)
1000.0000 mL | Freq: Once | INTRAVENOUS | Status: AC
Start: 2017-12-30 — End: 2017-12-30
  Administered 2017-12-30: 1000 mL via INTRAVENOUS

## 2017-12-30 MED ORDER — DEXTROSE 5 % IV SOLN
2.0000 g | Freq: Three times a day (TID) | INTRAVENOUS | Status: DC
  Administered 2017-12-30 – 2017-12-31 (×3): 2 g via INTRAVENOUS
  Filled 2017-12-30 (×4): qty 2

## 2017-12-30 MED ORDER — MORPHINE SULFATE (PF) 4 MG/ML IV SOLN
4.0000 mg | Freq: Once | INTRAVENOUS | Status: AC
  Administered 2017-12-30: 4 mg via INTRAVENOUS
  Filled 2017-12-30: qty 1

## 2017-12-30 MED ORDER — HYDROMORPHONE HCL 1 MG/ML IJ SOLN
1.0000 mg | INTRAMUSCULAR | Status: DC | PRN
  Administered 2017-12-30 – 2018-01-05 (×36): 1 mg via INTRAVENOUS
  Filled 2017-12-30 (×36): qty 1

## 2017-12-30 MED ORDER — METRONIDAZOLE IN NACL 5-0.79 MG/ML-% IV SOLN
500.0000 mg | Freq: Three times a day (TID) | INTRAVENOUS | Status: DC
  Administered 2017-12-30 – 2017-12-31 (×2): 500 mg via INTRAVENOUS
  Filled 2017-12-30 (×4): qty 100

## 2017-12-30 MED ORDER — ONDANSETRON HCL 4 MG/2ML IJ SOLN
4.0000 mg | Freq: Once | INTRAMUSCULAR | Status: AC
  Administered 2017-12-30: 4 mg via INTRAVENOUS
  Filled 2017-12-30: qty 2

## 2017-12-30 MED ORDER — DOCUSATE SODIUM 100 MG PO CAPS
100.0000 mg | ORAL_CAPSULE | Freq: Two times a day (BID) | ORAL | Status: DC
  Administered 2017-12-30 – 2018-01-02 (×5): 100 mg via ORAL
  Filled 2017-12-30 (×5): qty 1

## 2017-12-30 MED ORDER — LACTATED RINGERS IV SOLN
INTRAVENOUS | Status: DC
  Administered 2017-12-30 – 2018-01-03 (×8): via INTRAVENOUS
  Administered 2018-01-03 (×2): 1 mL via INTRAVENOUS
  Administered 2018-01-04 – 2018-01-05 (×2): via INTRAVENOUS

## 2017-12-30 MED ORDER — CLINDAMYCIN PHOSPHATE 600 MG/50ML IV SOLN
600.0000 mg | Freq: Once | INTRAVENOUS | Status: AC
  Administered 2017-12-30: 600 mg via INTRAVENOUS
  Filled 2017-12-30: qty 50

## 2017-12-30 MED ORDER — IOPAMIDOL (ISOVUE-300) INJECTION 61%
INTRAVENOUS | Status: AC
  Filled 2017-12-30: qty 30

## 2017-12-30 MED ORDER — ONDANSETRON HCL 4 MG PO TABS: 4.0000 mg | ORAL_TABLET | Freq: Four times a day (QID) | ORAL | Status: DC | PRN

## 2017-12-30 MED ORDER — VANCOMYCIN HCL IN DEXTROSE 750-5 MG/150ML-% IV SOLN
750.0000 mg | Freq: Two times a day (BID) | INTRAVENOUS | Status: DC
  Administered 2017-12-31: 750 mg via INTRAVENOUS
  Filled 2017-12-30 (×2): qty 150

## 2017-12-30 NOTE — Progress Notes (Signed)
Applied Mepitel contact layer to wound and covered with fluffed 4x4s and ABDs per Dr. Redmond Pulling for overnight wound care.  Skin prep to surrounding skin. Circular wound area is 8x8 cm (see pictures in Dr. Dois Davenport note).   Oral care protocol started since pt NPO except for ice, sips.

## 2017-12-30 NOTE — Progress Notes (Signed)
Texted Dr. Lorin Mercy regarding something for anxiety.

## 2017-12-30 NOTE — ED Notes (Signed)
Purewick applied to patient. 

## 2017-12-30 NOTE — H&P (Signed)
History and Physical    [name] BZJ:[id number] DOB: 04/22/1951 DOA: 12/30/2017  PCP: Patient, No Pcp Per Consultants:  Benay Spice - oncology; [place] - surgery; [place] - plastics Patient coming from:  Home - lives with husband; NOK: husband, [phone]  Chief Complaint: wound infection  HPI: Yolanda Watson is a 67 y.o. female with medical history significant of metastatic colon cancer to the abdominal wall s/p recent resection and reconstruction presenting with wound drainage.  She reports that she developed severe pain in her inner thighs and pelvis region, she was unable to walk.  It started Saturday night and Sunday morning.  She has had severe leg pain in the past, but nothing like this.  She was last able to walk on Saturday AM.  "The wound itself is fine but the hole around it is not - it is bad, smelly, everything".  She was started on Dakin's about a week ago but this is helping, she is "draining all the time and I can't live like that".  Her cancer was diagnosed in 2016 and she had the mass removed and a colectomy.  She had a later reversal and they thought they had gotten everything.  The mass came back and it was growing outward rather than inward.  She completed chemotherapy and radiation in 2018 and was referred to plastics to have the mass removed.  She believes that she is cancer free at this time.  ED Course:  Abdominal wall mass followed by surgery and plastics.  Now with osteomyelitis of pubic arthritis - given Vanc.  Given pain meds.  Review of Systems: As per HPI; otherwise review of systems reviewed and negative.   Ambulatory Status:  Ambulated without assistance prior, last on Saturday  Past Medical History:  Diagnosis Date  . Acquired bilateral hand deformity    both hads with severe digit contnracture. VERY limited ROM   . Alcohol abuse   . Colon cancer ([place]) 2016   metastatic to abdominal wall  . Port-A-Cath in place since 04-2017   right chest     Past  Surgical History:  Procedure Laterality Date  . ABDOMINAL SURGERY  2016  . ABDOMINOPLASTY N/A 10/03/2017   Procedure: ABDOMINAL WALL RECONSTRUCTION WITH STATTICE;  Surgeon: [name], MD;  Location: [facility];  Service: Plastics;  Laterality: N/A;  . EXCISION OF ABDOMINAL WALL TUMOR N/A 10/03/2017   Procedure: EXCISION OF COLON CANCER TUMOR FROM ABDOMINAL;  Surgeon: Stark Klein, MD;  Location: [facility];  Service: General;  Laterality: N/A;  TAP BLOCK  . INCISION AND DRAINAGE OF WOUND N/A 11/14/2017   Procedure: IRRIGATION AND DEBRIDEMENT OF ABDOMINAL WALL;  Surgeon: [name], MD;  Location: [facility];  Service: Plastics;  Laterality: N/A;  . IR FLUORO GUIDE PORT INSERTION RIGHT  05/09/2017  . IR US GUIDE VASC ACCESS RIGHT  05/09/2017   port-a-cath      Social History   Socioeconomic History  . Marital status: Married    Spouse name: Not on file  . Number of children: Not on file  . Years of education: Not on file  . Highest education level: Not on file  Social Needs  . Financial resource strain: Not on file  . Food insecurity - worry: Not on file  . Food insecurity - inability: Not on file  . Transportation needs - medical: Not on file  . Transportation needs - non-medical: Not on file  Occupational History  . Occupation: retired  Tobacco Use  . Smoking  status: Current Every Day Smoker    Packs/day: 1.00    Years: 45.00    Pack years: 45.00    Types: Cigarettes  . Smokeless tobacco: Never Used  Substance and Sexual Activity  . Alcohol use: Yes    Alcohol/week: 1.2 - 2.4 oz    Types: 2 - 4 Shots of liquor per week    Comment: denies problem with ETOH but contineus to drink "a couple of drinks per day"  . Drug use: No  . Sexual activity: Not on file  Other Topics Concern  . Not on file  Social History Narrative  . Not on file    Allergies  Allergen Reactions  . Latex Itching and Rash    BED "CHUX PADS"  . Penicillins Other (See Comments)    Passed out as a  child Has patient had a PCN reaction causing immediate rash, facial/tongue/throat swelling, SOB or lightheadedness with hypotension: Yes Has patient had a PCN reaction causing severe rash involving mucus membranes or skin necrosis: No Has patient had a PCN reaction that required hospitalization: No Has patient had a PCN reaction occurring within the last 10 years: No If all of the above answers are "NO", then may proceed with Cephalosporin use.     Family History  Problem Relation Age of Onset  . Cancer Neg Hx     Prior to Admission medications   Medication Sig Start Date End Date Taking? Authorizing Provider  naproxen sodium (ANAPROX) 220 MG tablet Take 220-440 mg 2 (two) times daily as needed by mouth (for pain).    Yes [provider]  oxyCODONE (OXY IR/ROXICODONE) 5 MG immediate release tablet Take 1-2 tablets (5-10 mg total) every 4 (four) hours as needed by mouth for moderate pain, severe pain or breakthrough pain. Patient not taking: Reported on 12/30/2017 11/18/17   [name], MD    Physical Exam: Vitals:   12/30/17 1545 12/30/17 1630 12/30/17 1700 12/30/17 1719  BP: (!) 101/57 118/72 125/72 123/68  Pulse: 84 91 88 91  Resp:   18 18  Temp:    98.2 F (36.8 C)  TempSrc:    Oral  SpO2: 94% 97% 95% 97%  Weight:    51.8 kg (114 lb 3.2 oz)  Height:    5\' 6"  (1.676 m)     General:  Appears calm and comfortable and is NAD Eyes:  PERRL, EOMI, normal lids, iris ENT:  grossly normal hearing, lips & tongue, mmm Neck:  no LAD, masses or thyromegaly Cardiovascular:  RRR, no m/r/g. No LE edema.  Respiratory:   CTA bilaterally with no wheezes/rales/rhonchi.  Normal respiratory effort. Abdomen: There is a large dressing over a wound on her lower abdominal wall between her umbilicus and pubic region.  The upper portion of the wound shows granulomatous tissue that appears to be healing.  However, there is an approximately 3 cm wound at the base near the pubic region  that has significant purulent drainage that smells feculent. Back:   normal alignment, no CVAT Skin:  no rash or induration seen on limited exam other than as described above Musculoskeletal:  grossly normal tone BUE/BLE, good ROM, no bony abnormality Lower extremity:  No LE edema.  Limited foot exam with no ulcerations.  2+ distal pulses. Psychiatric:  grossly normal mood and affect, speech fluent and appropriate, AOx3 Neurologic:  CN 2-12 grossly intact, moves all extremities in coordinated fashion, sensation intact    Radiological Exams on Admission: Ct Abdomen Pelvis W  Contrast  Result Date: 12/30/2017 CLINICAL DATA:  Lower abdominal pain. History of prior surgery to remove a large anterior pelvic mass. EXAM: CT ABDOMEN AND PELVIS WITH CONTRAST TECHNIQUE: Multidetector CT imaging of the abdomen and pelvis was performed using the standard protocol following bolus administration of intravenous contrast. CONTRAST:  175mL ISOVUE-300 IOPAMIDOL (ISOVUE-300) INJECTION 61% COMPARISON:  CT scan 07/21/2017 FINDINGS: Lower chest: The lung bases are clear except for dependent bibasilar atelectasis. The heart is normal in size. No pericardial effusion. Hepatobiliary: Small scattered calcified granulomas. No worrisome hepatic lesions or intrahepatic biliary dilatation. The gallbladder is normal. No common bile duct dilatation. Pancreas: No mass, inflammation or ductal dilatation. Spleen: Numerous calcified granulomas. Adrenals/Urinary Tract: The adrenal glands and kidneys are unremarkable. Moderate anterior wall thickening of the bladder likely due to the overlying inflammation/infection. Stomach/Bowel: The stomach, duodenum, small bowel and colon are grossly normal in the abdomen. In the pelvis there are small bowel loops right up underneath the anterior abdominal wall with there is a large soft tissue defect from prior surgery. I do not see any definite leaking oral contrast but findings suspicious for small  bowel fistula. There is a tract of air in fluid in the anterior pelvic wall in the midline and extending into the right inguinal area. There is also air and fluid in the anterior abdominal wound. Vascular/Lymphatic: Stable atherosclerotic calcifications involving the aorta and branch vessels. No aneurysm or dissection. The major venous structures are patent. Numerous mesenteric and retroperitoneal lymph nodes likely inflammatory/hyperplastic. Reproductive: The uterus is normal. Moderate retroversion. There is a stable left ovarian dermoid measuring 3.7 cm. Other: No free intrapelvic fluid collections Musculoskeletal: CT findings consistent with septic arthritis involving the pubic symphysis without obvious destructive bony changes but most likely associated osteomyelitis. IMPRESSION: 1. Anterior abdominal wound containing fluid and air along with a probable sinus track extending to the right inguinal area. Findings suspicious for a small bowel fistula although I do not see any obvious leaking oral contrast. 2. Septic arthritis involving the pubic symphysis. 3. Moderate wall thickening of the anterior bladder likely due to the adjacent inflammatory process. 4. Left ovarian dermoid. Electronically Signed   By: Marijo Sanes M.D.   On: 12/30/2017 14:00   Dg Hip Unilat W Or Wo Pelvis 2-3 Views Right  Result Date: 12/30/2017 CLINICAL DATA:  Right hip pain, no known injury, initial encounter EXAM: DG HIP (WITH OR WITHOUT PELVIS) 2-3V RIGHT COMPARISON:  None. FINDINGS: There is no evidence of hip fracture or dislocation. There is no evidence of arthropathy or other focal bone abnormality. IMPRESSION: No acute abnormality noted. Electronically Signed   By: Inez Catalina M.D.   On: 12/30/2017 10:22   Dg Femur Min 2 Views Right  Result Date: 12/30/2017 CLINICAL DATA:  Right leg pain, no known injury, initial encounter EXAM: RIGHT FEMUR 2 VIEWS COMPARISON:  None. FINDINGS: There is no evidence of fracture or other focal  bone lesions. Soft tissues are unremarkable. IMPRESSION: No acute abnormality noted. Electronically Signed   By: Inez Catalina M.D.   On: 12/30/2017 10:27    EKG: not done   Labs on Admission: I have personally reviewed the available labs and imaging studies at the time of the admission.  Pertinent labs:   Glucose 115 Albumin 2.5; 4/0 in 7/18, 3.4 in 8/18 Lactate 1.26 WBC 12.8 Hgb 10.3 Wound culture pending UA unremarkable other than 5 ketones  Assessment/Plan Principal Problem:   Cancer of right colon (HCC) Active Problems:   Open  wound of abdomen   Osteomyelitis ([employer])   -Patient with h/o metastatic colon cancer, initially diagnosed in 2/16; metastatic to anterior abdominal/pelvic wall in 4-5/18 -She completed 5 cycles of FOLFOX as of 7/18 with resultant mild decrease in the size of a right inguinal LN and the anterior abdominal wall mass -She completed palliative radiation to the abdominal wall mass in 8-9/18 -She had resection of the mass in 10/18 and pathology verified the presence of adenocarcinoma -She has not had further oncology f/u since -She presents today with ongoing abdominal wall wound which appears to be healing other than a large (3 cm) area at the base -Through that area, there is significant purulent drainage which smells as though it contains feculent material -CT shows concern for a small bowel fistula -Surgery and plastic surgery have been consulted and will see the patient in the AM -She is NPO other than sips with meds and ice chips -She also appears to have local invasion of the infection into her pubic symphysis causing osteomyelitis - this is likely the source of her pain -Will treat her pain with Dilaudid and anxiety with Ativan -She has significant financial concerns, will request SW consult -After discussion with pharmacy to ensure adequate coverage of intraabdominal, wound, and potentially bone infections, will currently cover with  Vanc/Cefepime/Flagyl -Consider addition of anti-fungal -Consider ID consult -Palliative care consult ordered -Suggest oncology consult tomorrow, as well -Bed rest for now; she is likely to need PT consult   DVT prophylaxis: SCDs Code Status:  DNR - confirmed with patient Family Communication: I spoke with her husband via telephone Disposition Plan:  To be determined Consults called: Surgery; plastic surgery; SW Admission status: No comment available   Karmen Bongo MD [facility]  If note is complete, please contact covering daytime or nighttime physician. [email or web address] Password [email or web address]  12/30/2017, 6:10 PM

## 2017-12-30 NOTE — ED Provider Notes (Signed)
[facility] EMERGENCY DEPARTMENT Provider Note   CSN: [id number] Arrival date & time: 12/30/17  1017     History   Chief Complaint Chief Complaint  Patient presents with  . Wound discharge    HPI Yolanda Watson is a 67 y.o. female.  HPI   67 year old female with history of colon cancer, alcohol abuse brought here via EMS from home for evaluation of an abdominal wound.  Patient with history of right colon cancer diagnosed in 2016.  She underwent a colectomy and temporary colostomy in [place] found to have invasive as adenocarcinoma that is well differentiated.  His CT in April 2018 revealed a large heterogeneous anterior pelvic wall mass.  This mass has metastasized to the abdominal wall requiring treatment by plastic and reconstructive surgery.  Her course was complicated by necrosis and dehiscence of the skin flap.  She has had debridement of abdominal wall, with reconstruction and having wound VAC.  She was last seen by surgeon Dr. Esmeralda Arthur on 12/21 it was noted that there was purulent discharge from the wound.  She was provided oxycodone has pain medication but no antibiotic.  She has an appointment with oncologist on 01/19/18.  She mentioned that her abdominal wound area is not painful but continues to ooze out greenish discharge.  She does report having pain to her right hip and right thigh for the past week.  Pain is prohibiting her from walking.  Mentioned that the odor from the wound is also worse.  No report of fever, increased redness.  Denies any numbness of the legs, bowel bladder incontinence or saddle anesthesia.  Due to increasing pain, patient was recommended to come to ER for further evaluation.  Past Medical History:  Diagnosis Date  . Acquired bilateral hand deformity    both hads with severe digit contnracture. VERY limited ROM   . Alcohol abuse   . Cancer ([place])   . Colon cancer ([place]) 2016  . Port-A-Cath in place since 04-2017   right chest     Patient  Active Problem List   Diagnosis Date Noted  . Open wound of abdomen 11/14/2017  . Port-A-Cath in place 10/21/2017  . Local recurrence of colon cancer ([place]) 10/03/2017  . Secondary malignancy of inguinal lymph nodes ([place]) 07/29/2017  . Cancer of right colon ([place]) 05/05/2017    Past Surgical History:  Procedure Laterality Date  . ABDOMINAL SURGERY  2016  . ABDOMINOPLASTY N/A 10/03/2017   Procedure: ABDOMINAL WALL RECONSTRUCTION WITH STATTICE;  Surgeon: [name], MD;  Location: [facility];  Service: Plastics;  Laterality: N/A;  . EXCISION OF ABDOMINAL WALL TUMOR N/A 10/03/2017   Procedure: EXCISION OF COLON CANCER TUMOR FROM ABDOMINAL;  Surgeon: Stark Klein, MD;  Location: [facility];  Service: General;  Laterality: N/A;  TAP BLOCK  . INCISION AND DRAINAGE OF WOUND N/A 11/14/2017   Procedure: IRRIGATION AND DEBRIDEMENT OF ABDOMINAL WALL;  Surgeon: [name], MD;  Location: [facility];  Service: Plastics;  Laterality: N/A;  . IR FLUORO GUIDE PORT INSERTION RIGHT  05/09/2017  . IR US GUIDE VASC ACCESS RIGHT  05/09/2017   port-a-cath      OB History    No data available       Home Medications    Prior to Admission medications   Medication Sig Start Date End Date Taking? Authorizing Provider  naproxen sodium (ANAPROX) 220 MG tablet Take 220-440 mg 2 (two) times daily as needed by mouth (for pain).  [provider]  oxyCODONE (OXY IR/ROXICODONE) 5 MG immediate release tablet Take 1-2 tablets (5-10 mg total) every 4 (four) hours as needed by mouth for moderate pain, severe pain or breakthrough pain. 11/18/17   [name], MD    Family History No family history on file.  Social History Social History   Tobacco Use  . Smoking status: Current Every Day Smoker    Types: Cigarettes  . Smokeless tobacco: Never Used  Substance Use Topics  . Alcohol use: Yes    Alcohol/week: 8.4 oz    Types: 14 Cans of beer per week  . Drug use: No     Allergies   Latex and  Penicillins   Review of Systems Review of Systems  All other systems reviewed and are negative.    Physical Exam Updated Vital Signs BP 107/69   Pulse (!) 58   Temp 98.6 F (37 C) (Oral)   Resp 16   Ht 5\' 6"  (1.676 m)   Wt 49.9 kg (110 lb)   SpO2 96%   BMI 17.75 kg/m   Physical Exam  Constitutional: She is oriented to person, place, and time. She appears well-developed and well-nourished. No distress.  HENT:  Head: Atraumatic.  Eyes: Conjunctivae are normal.  Neck: Neck supple.  Cardiovascular: Normal rate and regular rhythm.  Pulmonary/Chest: Effort normal and breath sounds normal.  Abdominal: Soft.  A large lower abdominal wall wound with well-appearing skin changes however, there is a 2 cm deep ulceration to the inferior aspect of the abdominal wound with pustular discharge and strong odor that is tender to palpation.  Musculoskeletal: She exhibits tenderness (Tenderness along lower lumbar spine, right hip, and right thigh on palpation without any overlying skin changes or deformity noted.).  Neurological: She is alert and oriented to person, place, and time.  Skin: No rash noted.  Psychiatric: She has a normal mood and affect.  Nursing note and vitals reviewed.        ED Treatments / Results  Labs (all labs ordered are listed, but only abnormal results are displayed) Labs Reviewed  COMPREHENSIVE METABOLIC PANEL - Abnormal; Notable for the following components:      Result Value   Chloride 96 (*)    Glucose, Bld 115 (*)    Calcium 8.3 (*)    Total Protein 6.2 (*)    Albumin 2.5 (*)    Alkaline Phosphatase 128 (*)    All other components within normal limits  CBC WITH DIFFERENTIAL/PLATELET - Abnormal; Notable for the following components:   WBC 12.8 (*)    RBC 3.77 (*)    Hemoglobin 10.3 (*)    HCT 33.4 (*)    RDW 16.2 (*)    Neutro Abs 11.2 (*)    All other components within normal limits  URINALYSIS, ROUTINE W REFLEX MICROSCOPIC - Abnormal; Notable  for the following components:   Ketones, ur 5 (*)    All other components within normal limits  AEROBIC CULTURE (SUPERFICIAL SPECIMEN)  I-STAT CG4 LACTIC ACID, ED  I-STAT CG4 LACTIC ACID, ED    EKG  EKG Interpretation None       Radiology Ct Abdomen Pelvis W Contrast  Result Date: 12/30/2017 CLINICAL DATA:  Lower abdominal pain. History of prior surgery to remove a large anterior pelvic mass. EXAM: CT ABDOMEN AND PELVIS WITH CONTRAST TECHNIQUE: Multidetector CT imaging of the abdomen and pelvis was performed using the standard protocol following bolus administration of intravenous contrast. CONTRAST:  123mL ISOVUE-300 IOPAMIDOL (  ISOVUE-300) INJECTION 61% COMPARISON:  CT scan 07/21/2017 FINDINGS: Lower chest: The lung bases are clear except for dependent bibasilar atelectasis. The heart is normal in size. No pericardial effusion. Hepatobiliary: Small scattered calcified granulomas. No worrisome hepatic lesions or intrahepatic biliary dilatation. The gallbladder is normal. No common bile duct dilatation. Pancreas: No mass, inflammation or ductal dilatation. Spleen: Numerous calcified granulomas. Adrenals/Urinary Tract: The adrenal glands and kidneys are unremarkable. Moderate anterior wall thickening of the bladder likely due to the overlying inflammation/infection. Stomach/Bowel: The stomach, duodenum, small bowel and colon are grossly normal in the abdomen. In the pelvis there are small bowel loops right up underneath the anterior abdominal wall with there is a large soft tissue defect from prior surgery. I do not see any definite leaking oral contrast but findings suspicious for small bowel fistula. There is a tract of air in fluid in the anterior pelvic wall in the midline and extending into the right inguinal area. There is also air and fluid in the anterior abdominal wound. Vascular/Lymphatic: Stable atherosclerotic calcifications involving the aorta and branch vessels. No aneurysm or  dissection. The major venous structures are patent. Numerous mesenteric and retroperitoneal lymph nodes likely inflammatory/hyperplastic. Reproductive: The uterus is normal. Moderate retroversion. There is a stable left ovarian dermoid measuring 3.7 cm. Other: No free intrapelvic fluid collections Musculoskeletal: CT findings consistent with septic arthritis involving the pubic symphysis without obvious destructive bony changes but most likely associated osteomyelitis. IMPRESSION: 1. Anterior abdominal wound containing fluid and air along with a probable sinus track extending to the right inguinal area. Findings suspicious for a small bowel fistula although I do not see any obvious leaking oral contrast. 2. Septic arthritis involving the pubic symphysis. 3. Moderate wall thickening of the anterior bladder likely due to the adjacent inflammatory process. 4. Left ovarian dermoid. Electronically Signed   By: Marijo Sanes M.D.   On: 12/30/2017 14:00   Dg Hip Unilat W Or Wo Pelvis 2-3 Views Right  Result Date: 12/30/2017 CLINICAL DATA:  Right hip pain, no known injury, initial encounter EXAM: DG HIP (WITH OR WITHOUT PELVIS) 2-3V RIGHT COMPARISON:  None. FINDINGS: There is no evidence of hip fracture or dislocation. There is no evidence of arthropathy or other focal bone abnormality. IMPRESSION: No acute abnormality noted. Electronically Signed   By: Inez Catalina M.D.   On: 12/30/2017 10:22   Dg Femur Min 2 Views Right  Result Date: 12/30/2017 CLINICAL DATA:  Right leg pain, no known injury, initial encounter EXAM: RIGHT FEMUR 2 VIEWS COMPARISON:  None. FINDINGS: There is no evidence of fracture or other focal bone lesions. Soft tissues are unremarkable. IMPRESSION: No acute abnormality noted. Electronically Signed   By: Inez Catalina M.D.   On: 12/30/2017 10:27    Procedures Procedures (including critical care time)  Medications Ordered in ED Medications  iopamidol (ISOVUE-300) 61 % injection (not  administered)  morphine 4 MG/ML injection 4 mg (4 mg Intravenous Given 12/30/17 1106)  ondansetron (ZOFRAN) injection 4 mg (4 mg Intravenous Given 12/30/17 1106)  sodium chloride 0.9 % bolus 1,000 mL (0 mLs Intravenous Stopped 12/30/17 1411)  clindamycin (CLEOCIN) IVPB 600 mg (0 mg Intravenous Stopped 12/30/17 1140)  iopamidol (ISOVUE-300) 61 % injection (100 mLs  Contrast Given 12/30/17 1303)  vancomycin (VANCOCIN) IVPB 1000 mg/200 mL premix (1,000 mg Intravenous New Bag/Given 12/30/17 1442)     Initial Impression / Assessment and Plan / ED Course  I have reviewed the triage vital signs and the nursing notes.  Pertinent labs & imaging results that were available during my care of the patient were reviewed by me and considered in my medical decision making (see chart for details).     BP 100/65   Pulse 85   Temp 98.6 F (37 C) (Oral)   Resp 16   Ht 5\' 6"  (1.676 m)   Wt 49.9 kg (110 lb)   SpO2 99%   BMI 17.75 kg/m    Final Clinical Impressions(s) / ED Diagnoses   Final diagnoses:  Abdominal pain, lower  Abdominal wall abscess at site of surgical wound  Septic arthritis of pelvic region [facility])    ED Discharge Orders    None     9:41 AM Patient here for pain to her lower back, right hip and right thigh for nearly a week, having difficulty ambulating.  She also has a abdominal wound from cancer metastases that has been managed by plastic surgeon [name] and general surgery Dr. Barry Dienes for the past several months.  She has a 2 cm ulceration to the inferior aspects of the abdominal wound oozing out pustular foul odor discharge concerning for infection.  I will obtain a CT scan to assess for potential fistula, or abscess or perforation. Pain medication given.  Xray of R hip and R thigh ordered.    3:53 PM Initial x-ray of right hip and thigh without any acute abnormalities.  Abdominal pelvis CT scan demonstrates an anterior abdominal wound containing fluid and air along with a probable  sinus tract into the right inguinal area.  Finding suspicious for a small bowel fistula although no obvious leaking oral contrast noted.  Evidence of septic arthritis involving the pubic symphysis this is likely the source of the patient's pain.  There is also moderate wall thickening of the anterior bladder likely due to the adjacent inflammatory process.  Given this finding, patient will need to be  treat for potential osteomyelitis.  Will initiate vancomycin.  Patient did receive clindamycin previously.  Pain medication given.  Plan to consult medicine for admission.  4:04 PM Appreciate consultation from [facility] Dr. Lorin Mercy who agrees to see and admit pt.  She request plastic surgeon to be involve and to determine antibiotic choices.  Will page [name] who is involved in this patient's care.    4:30 PM I have consulted with plastic surgeon [name] who will be involve in pt's care and will see pt in the hospital tomorrow.  She also request surgery to be involve as well.  Will page surgery.    4:38 PM I have consulted with general surgery DR. Redmond Pulling who will notify Dr. Barry Dienes and will also be available for further management.     Domenic Moras, PA-C 12/30/17 1639    Virgel Manifold, MD 12/30/17 1700

## 2017-12-30 NOTE — Progress Notes (Signed)
Patient ID: Yolanda Watson, female   DOB: 1951-11-04, 67 y.o.   MRN: [id number]   Acute Care Surgery Service Progress Note:    Chief Complaint/Subjective: Private pt of Dr Marlowe Aschoff  Patient came to the emergency room because of worsening pelvic pain as well as severe pain when trying to ambulate.  This started over the weekend.  She also reports increased drainage from her chronic abdominal wound.  He denies any fever, chills, nausea or vomiting.  She reports the drainage is more malodorous as well as increased in volume.  It is not brown or greenish  Patient underwent excision of malignant mass from her abdominal wall including skin, muscle, fascia and peritoneum 15 x 12 cm by Dr. Barry Dienes on October 5.  Lasix did abdominal wall reconstruction with [name] same day.  Her postoperative course was complicated by skin flap necrosis.  She underwent excision of skin, subcu tissue, fascia and stratus on November 16.  She had a VAC placed ultimately.  History - Patient is a 67 year old female of Dr. Gearldine Shown who has metastatic colon cancer to the abdominal wall. The patient was diagnosed with colon cancer at [facility] in 2016. She underwent a right hemicolectomy by [name]. Final pathology was [id number] MSI stable. She did not receive chemotherapy. She started feeling pain and a bulge in her lower abdominal wall in April 2018. Imaging was performed which showed pelvic wall mass. This was biopsied and showed mucinous stroma. She has received 5 cycles of FOLFOX, three of which were with Avastin. She has had noticeable shrinkage and the tumor by her own perception as well as by imaging. She also is not having any pain any longer at this point. Her main complaint is the musculoskeletal pain following injection of a granulocyte stimulating hormone.  She has subsequently started palliative radiation with Dr. Lisbeth Renshaw. She received 5 cycles    Objective: Vital signs in last 24 hours: Temp:  [98.2 F  (36.8 C)-98.6 F (37 C)] 98.2 F (36.8 C) (01/01 1719) Pulse Rate:  [58-96] 91 (01/01 1719) Resp:  [16-18] 18 (01/01 1719) BP: (100-131)/(57-77) 123/68 (01/01 1719) SpO2:  [94 %-100 %] 97 % (01/01 1719) Weight:  [49.9 kg (110 lb)-51.8 kg (114 lb 3.2 oz)] 51.8 kg (114 lb 3.2 oz) (01/01 1719)    Intake/Output from previous day: No intake/output data recorded. Intake/Output this shift: No intake/output data recorded.  Lungs: cta, nonlabored  Cardiovascular: reg  Abd: Lower abdominal wound with small bowel with granulation tissue.  At the inferior aspect there is some pooling of purulent fluid.  I cannot detect any evidence of small bowel contents-there is no bilious or feculent drainage.  Surrounding intact skin appears good.  See picture below  Extremities: no edema, +SCDs  Neuro: alert, nonfocal       Lab Results: CBC  Recent Labs    12/30/17 0837  WBC 12.8*  HGB 10.3*  HCT 33.4*  PLT 363   BMET Recent Labs    12/30/17 0837  NA 135  K 3.6  CL 96*  CO2 25  GLUCOSE 115*  BUN 14  CREATININE 0.55  CALCIUM 8.3*   LFT Hepatic Function Latest Ref Rng & Units 12/30/2017 08/07/2017 07/24/2017  Total Protein 6.5 - 8.1 g/dL 6.2(L) 7.3 7.0  Albumin 3.5 - 5.0 g/dL 2.5(L) 3.4(L) 4.0  AST 15 - 41 U/L '23 25 23  '$ ALT 14 - 54 U/L '18 21 19  '$ Alk Phosphatase 38 - 126 U/L 128(H) 138 124  Total Bilirubin 0.3 - 1.2 mg/dL 0.5 0.49 0.33   PT/INR No results for input(s): LABPROT, INR in the last 72 hours. ABG No results for input(s): PHART, HCO3 in the last 72 hours.  Invalid input(s): PCO2, PO2  Studies/Results:  Anti-infectives: Anti-infectives (From admission, onward)   Start     Dose/Rate Route Frequency Ordered Stop   12/31/17 0300  vancomycin (VANCOCIN) IVPB 750 mg/150 ml premix     750 mg 150 mL/hr over 60 Minutes Intravenous Every 12 hours 12/30/17 1740     12/30/17 1830  metroNIDAZOLE (FLAGYL) IVPB 500 mg     500 mg 100 mL/hr over 60 Minutes Intravenous Every 8  hours 12/30/17 1724     12/30/17 1800  ceFEPIme (MAXIPIME) 2 g in dextrose 5 % 50 mL IVPB     2 g 100 mL/hr over 30 Minutes Intravenous Every 8 hours 12/30/17 1728     12/30/17 1430  vancomycin (VANCOCIN) IVPB 1000 mg/200 mL premix     1,000 mg 200 mL/hr over 60 Minutes Intravenous  Once 12/30/17 1428 12/30/17 1545   12/30/17 1000  clindamycin (CLEOCIN) IVPB 600 mg     600 mg 100 mL/hr over 30 Minutes Intravenous  Once 12/30/17 0954 12/30/17 1140      Medications: Scheduled Meds: . docusate sodium  100 mg Oral BID  . iopamidol      . sodium chloride flush  10-40 mL Intracatheter Q12H   Continuous Infusions: . ceFEPime (MAXIPIME) IV 2 g (12/30/17 1815)  . lactated ringers 100 mL/hr at 12/30/17 1733  . metronidazole    . [START ON 12/31/2017] vancomycin     PRN Meds:.acetaminophen **OR** acetaminophen, HYDROmorphone (DILAUDID) injection, LORazepam, ondansetron **OR** ondansetron (ZOFRAN) IV, sodium chloride flush  Assessment/Plan: Patient Active Problem List   Diagnosis Date Noted  . Osteomyelitis ([place]) 12/30/2017  . Open wound of abdomen 11/14/2017  . Port-A-Cath in place 10/21/2017  . Local recurrence of colon cancer ([place]) 10/03/2017  . Secondary malignancy of inguinal lymph nodes ([place]) 07/29/2017  . Cancer of right colon ([place]) 05/05/2017   Radiological evidence of osteomyelitis Open Abdominal wound with purulent drainage  CT was concerning for possible fistula but there is no extravasation of oral contrast.  On physical examination there is no evidence of enteric contents draining. Will defer wound care guidelines to mgmt to plastics as well as Dr. Barry Dienes Discussed overnight wound care with nurse.  Disposition:  LOS: 0 days    Leighton Ruff. Redmond Pulling, MD, FACS General, Bariatric, & Minimally Invasive Surgery [phone] [facility]

## 2017-12-30 NOTE — ED Notes (Signed)
Patient transported to X-ray 

## 2017-12-30 NOTE — ED Triage Notes (Signed)
Pt in from home via [facility] with c/o increased LLQ wound drainage and odor x few days. Pt states she had 2nd cancerous LLQ abdominal mass removed around 1 month ago. This week, brown discharge and odor has increased. Pt states Dr. Harvin Hazel with Plastics is Psychologist, sport and exercise. Denies fevers, states her bilateral LE's have been weak x 3 days.

## 2017-12-30 NOTE — ED Notes (Signed)
Bilateral pedal pulses auscultated with doppler. Very faint. Marked.

## 2017-12-30 NOTE — ED Notes (Signed)
Patient has [place] that comes to her home for wound care.

## 2017-12-30 NOTE — Progress Notes (Signed)
Pt arrived from ED in pain, obtained order for Dilaudid and gave 1 mg IV for pain with relief.  Dr. Redmond Pulling notified of pt location.  Purewick placed for pt comfort.

## 2017-12-30 NOTE — Progress Notes (Signed)
Pharmacy Antibiotic Note  Yolanda Watson is a 67 y.o. female admitted on 12/30/2017 with osteomyletis / wound infection.  Pharmacy has been consulted for Vancomycin and cefepime dosing. Patient has a history of metastatic colon cancer to abdominal all s/p resection, now with wound drainage and suspicion for small bowel fistula. Additionally, she has septic arthritis involving the pubic symphysis. WBC elevated at 12.8. SCr 0.55 (likely falsely low). CrCl ~ 70-80 mL/min. She has already received a dose of vancomycin.   Plan: -Vancomycin 750 mg IV Q 12 hours  -Cefepime 2 gm IV Q 8 hours -Metronidazole 500 mg IV q 8 hours per MD  Height: 5\' 6"  (167.6 cm) Weight: 114 lb 3.2 oz (51.8 kg) IBW/kg (Calculated) : 59.3  Temp (24hrs), Avg:98.4 F (36.9 C), Min:98.2 F (36.8 C), Max:98.6 F (37 C)  Recent Labs  Lab 12/30/17 0837 12/30/17 0844  WBC 12.8*  --   CREATININE 0.55  --   LATICACIDVEN  --  1.26    Estimated Creatinine Clearance: 57.3 mL/min (by C-G formula based on SCr of 0.55 mg/dL).    Allergies  Allergen Reactions  . Latex Itching and Rash    BED "CHUX PADS"  . Penicillins Other (See Comments)    Passed out as a child Has patient had a PCN reaction causing immediate rash, facial/tongue/throat swelling, SOB or lightheadedness with hypotension: Yes Has patient had a PCN reaction causing severe rash involving mucus membranes or skin necrosis: No Has patient had a PCN reaction that required hospitalization: No Has patient had a PCN reaction occurring within the last 10 years: No If all of the above answers are "NO", then may proceed with Cephalosporin use.     Antimicrobials this admission: Vanc 1/1 >>  Cefepime 1/1 >>  Flagyl 1/1>>  Dose adjustments this admission: None   Microbiology results: 1/1 Wound Cx >>   Thank you for allowing pharmacy to be a part of this patient's care.  Albertina Parr, PharmD., BCPS Clinical Pharmacist Pager [phone]

## 2017-12-31 ENCOUNTER — Other Ambulatory Visit: Payer: Self-pay

## 2017-12-31 ENCOUNTER — Encounter (HOSPITAL_COMMUNITY): Payer: Self-pay | Admitting: General Practice

## 2017-12-31 ENCOUNTER — Encounter: Payer: Self-pay | Admitting: *Deleted

## 2017-12-31 DIAGNOSIS — T8149XA Infection following a procedure, other surgical site, initial encounter: Secondary | ICD-10-CM

## 2017-12-31 DIAGNOSIS — M009 Pyogenic arthritis, unspecified: Secondary | ICD-10-CM

## 2017-12-31 DIAGNOSIS — M869 Osteomyelitis, unspecified: Secondary | ICD-10-CM

## 2017-12-31 DIAGNOSIS — Z515 Encounter for palliative care: Secondary | ICD-10-CM

## 2017-12-31 DIAGNOSIS — C182 Malignant neoplasm of ascending colon: Secondary | ICD-10-CM

## 2017-12-31 DIAGNOSIS — Z7189 Other specified counseling: Secondary | ICD-10-CM

## 2017-12-31 LAB — BASIC METABOLIC PANEL
ANION GAP: 6 (ref 5–15)
BUN: 6 mg/dL (ref 6–20)
CHLORIDE: 101 mmol/L (ref 101–111)
CO2: 27 mmol/L (ref 22–32)
Calcium: 7.8 mg/dL — ABNORMAL LOW (ref 8.9–10.3)
Creatinine, Ser: 0.48 mg/dL (ref 0.44–1.00)
GFR calc non Af Amer: >60 mL/min (ref >60)
Glucose, Bld: 105 mg/dL — ABNORMAL HIGH (ref 65–99)
POTASSIUM: 3.4 mmol/L — AB (ref 3.5–5.1)
SODIUM: 134 mmol/L — AB (ref 135–145)

## 2017-12-31 LAB — CBC
HEMATOCRIT: 27.4 % — AB (ref 36.0–46.0)
HEMOGLOBIN: 8.6 g/dL — AB (ref 12.0–15.0)
MCH: 27.9 pg (ref 26.0–34.0)
MCHC: 31.4 g/dL (ref 30.0–36.0)
MCV: 89 fL (ref 78.0–100.0)
Platelets: 330 10*3/uL (ref 150–400)
RBC: 3.08 MIL/uL — AB (ref 3.87–5.11)
RDW: 16.4 % — ABNORMAL HIGH (ref 11.5–15.5)
WBC: 9.3 10*3/uL (ref 4.0–10.5)

## 2017-12-31 LAB — HIV ANTIBODY (ROUTINE TESTING W REFLEX): HIV Screen 4th Generation wRfx: NONREACTIVE

## 2017-12-31 MED ORDER — ENOXAPARIN SODIUM 40 MG/0.4ML ~~LOC~~ SOLN
40.0000 mg | SUBCUTANEOUS | Status: DC
  Administered 2018-01-03 (×2): 40 mg via SUBCUTANEOUS
  Filled 2017-12-31 (×4): qty 0.4

## 2017-12-31 MED ORDER — DAKINS (1/4 STRENGTH) 0.125 % EX SOLN
Freq: Two times a day (BID) | CUTANEOUS | Status: DC
  Administered 2017-12-31: 22:00:00
  Filled 2017-12-31: qty 473

## 2017-12-31 NOTE — Progress Notes (Signed)
[facility] Infusion Coordinator will follow pt with ID team to support IV ABX post surgery as ordered.   If patient discharges after hours, please call [phone].   Larry Sierras 12/31/2017, 9:14 PM

## 2017-12-31 NOTE — Progress Notes (Signed)
Palliative Medicine consult noted. Due to high referral volume, there may be a delay seeing this patient. Please call the Palliative Medicine Team office at [phone] if recommendations are needed in the interim.  Thank you for inviting [place] to see this patient.  Marjie Skiff Tedi Hughson, RN, BSN, [facility] 12/31/2017 8:52 AM Office [phone]

## 2017-12-31 NOTE — Progress Notes (Signed)
Patient ID: Yolanda Watson, female   DOB: 01/19/1951, 67 y.o.   MRN: [id number]   Acute Care Surgery Service Progress Note:    Presenting issues:  Patient came to the emergency room 12/30/17 because of worsening pelvic pain and inability to ambulate over the last 1-2 days.  She reported increased drainage and worsened odor from her chronic abdominal wound.    Patient underwent excision of malignant mass from her abdominal wall including skin, muscle, fascia and peritoneum 15 x 12 cm by Dr. Byerly on October 5.  Dr. Thimmappa did abdominal wall reconstruction with [name] same day.  Her postoperative course was complicated by skin flap necrosis.  She underwent excision of skin, subcu tissue, fascia and stratus on November 16.  She had a VAC placed at that time.  [PRIOR ONCOLOGY HISTORY  Patient is a 67-year-old female of Dr. Sherrill's who has metastatic colon cancer to the abdominal wall. The patient was diagnosed with colon cancer at [facility] in 2016. She underwent a right hemicolectomy by Dr. Udekwu. Final pathology was [id number] MSI stable. She did not receive chemotherapy. She started feeling pain and a bulge in her lower abdominal wall in April 2018. Imaging was performed which showed pelvic wall mass. This was biopsied and showed mucinous stroma. She has received 5 cycles of FOLFOX, three of which were with Avastin. She has had noticeable shrinkage and the tumor by her own perception as well as by imaging. She also is not having any pain any longer at this point. Her main complaint is the musculoskeletal pain following injection of a granulocyte stimulating hormone.  She has subsequently started palliative radiation with Dr. Moody. She received 5 cycles prior to excision of abdominal wall mass.]  Subjective: Pt continues to have severe pain.  Is appreciative of female condom catheter to assist with voiding.   Objective: Vital signs in last 24 hours: Temp:  [98 F (36.7 C)-98.5 F  (36.9 C)] 98.4 F (36.9 C) (01/02 1345) Pulse Rate:  [80-91] 91 (01/02 1345) Resp:  [18-20] 18 (01/02 1345) BP: (100-125)/(56-72) 103/58 (01/02 1345) SpO2:  [94 %-99 %] 97 % (01/02 1345) Weight:  [51.8 kg (114 lb 3.2 oz)] 51.8 kg (114 lb 3.2 oz) (01/01 1719) Last BM Date: 12/30/17  Intake/Output from previous day: 01/01 0701 - 01/02 0700 In: 2823.3 [I.V.:1123.3; IV Piggyback:1700] Out: 250 [Urine:250] Intake/Output this shift: No intake/output data recorded.  Lungs: cta, nonlabored  Cardiovascular: reg  Abd: wound similar to pictures yesterday.  More pus pooling at inferior aspect of wound compared to when I saw her around 3 weeks ago.  No succous/stool seen.  Extremities: no edema, +SCDs  Neuro: alert, nonfocal      Lab Results: CBC  Recent Labs    12/30/17 0837 12/31/17 0517  WBC 12.8* 9.3  HGB 10.3* 8.6*  HCT 33.4* 27.4*  PLT 363 330   BMET Recent Labs    12/30/17 0837 12/31/17 0517  NA 135 134*  K 3.6 3.4*  CL 96* 101  CO2 25 27  GLUCOSE 115* 105*  BUN 14 6  CREATININE 0.55 0.48  CALCIUM 8.3* 7.8*   LFT Hepatic Function Latest Ref Rng & Units 12/30/2017 08/07/2017 07/24/2017  Total Protein 6.5 - 8.1 g/dL 6.2(L) 7.3 7.0  Albumin 3.5 - 5.0 g/dL 2.5(L) 3.4(L) 4.0  AST 15 - 41 U/L 23 25 23  ALT 14 - 54 U/L 18 21 19  Alk Phosphatase 38 - 126 U/L 128(H) 138 124  Total Bilirubin   0.3 - 1.2 mg/dL 0.5 0.49 0.33   PT/INR No results for input(s): LABPROT, INR in the last 72 hours. ABG No results for input(s): PHART, HCO3 in the last 72 hours.  Invalid input(s): PCO2, PO2  Studies/Results:  Anti-infectives: Anti-infectives (From admission, onward)   Start     Dose/Rate Route Frequency Ordered Stop   12/31/17 0300  vancomycin (VANCOCIN) IVPB 750 mg/150 ml premix  Status:  Discontinued     750 mg 150 mL/hr over 60 Minutes Intravenous Every 12 hours 12/30/17 1740 12/31/17 1235   12/30/17 1830  metroNIDAZOLE (FLAGYL) IVPB 500 mg  Status:  Discontinued       500 mg 100 mL/hr over 60 Minutes Intravenous Every 8 hours 12/30/17 1724 12/31/17 1235   12/30/17 1800  ceFEPIme (MAXIPIME) 2 g in dextrose 5 % 50 mL IVPB  Status:  Discontinued     2 g 100 mL/hr over 30 Minutes Intravenous Every 8 hours 12/30/17 1728 12/31/17 1235   12/30/17 1430  vancomycin (VANCOCIN) IVPB 1000 mg/200 mL premix     1,000 mg 200 mL/hr over 60 Minutes Intravenous  Once 12/30/17 1428 12/30/17 1545   12/30/17 1000  clindamycin (CLEOCIN) IVPB 600 mg     600 mg 100 mL/hr over 30 Minutes Intravenous  Once 12/30/17 0954 12/30/17 1140      Medications: Scheduled Meds: . docusate sodium  100 mg Oral BID  . enoxaparin (LOVENOX) injection  40 mg Subcutaneous Q24H  . mouth rinse  15 mL Mouth Rinse BID  . sodium chloride flush  10-40 mL Intracatheter Q12H  . sodium hypochlorite   Irrigation BID   Continuous Infusions: . lactated ringers 100 mL/hr at 12/31/17 0334   PRN Meds:.acetaminophen **OR** acetaminophen, HYDROmorphone (DILAUDID) injection, LORazepam, ondansetron **OR** ondansetron (ZOFRAN) IV, sodium chloride flush  Assessment/Plan: Patient Active Problem List   Diagnosis Date Noted  . Osteomyelitis (HCC) 12/30/2017  . Open wound of abdomen 11/14/2017  . Port-A-Cath in place 10/21/2017  . Local recurrence of colon cancer (HCC) 10/03/2017  . Secondary malignancy of inguinal lymph nodes (HCC) 07/29/2017  . Cancer of right colon (HCC) 05/05/2017   Radiological evidence of osteomyelitis Open Abdominal wound with purulent drainage  Discussed with Dr. Thimmappa.  Plan debridement in AM with her.  NPO after midnight.  Will send biopsy for possible cancer recurrence and bone for culture.    Pt has always been straightforward in clinic, and although she has required narcotics, has not had mobility issues until recently.  I suspect this is all infection since prior margins were negative, but she is certainly at high risk for cancer recurrence.       LOS: 1 day    [phone] [facility]  

## 2017-12-31 NOTE — Consult Note (Signed)
Reason for Consult:Pubic symphysis osteomylelits Referring Physician: R Netty  Yolanda Watson is an 67 y.o. female with a history of metastatic colon ca with anterior abd wall tumor and chronic wound following resection, managed by [facility] and [name]. HPI: Yolanda Watson was in her usual state of health until Sunday morning when she woke up with right thigh/hip pain. It hurt worse with ambulation. She denied any prior e/o. She did have some isolated fevers and night sweats, one each. The pain continued to worsen and she sought care. CT scan showed pubic symphysis osteo without any evidence of bony destruction and orthopedic surgery was consulted.  Past Medical History:  Diagnosis Date  . Acquired bilateral hand deformity    both hads with severe digit contnracture. VERY limited ROM   . Alcohol abuse   . Colon cancer ([place]) 2016   metastatic to abdominal wall  . Port-A-Cath in place since 04-2017   right chest     Past Surgical History:  Procedure Laterality Date  . ABDOMINAL SURGERY  2016  . ABDOMINOPLASTY N/A 10/03/2017   Procedure: ABDOMINAL WALL RECONSTRUCTION WITH STATTICE;  Surgeon: [name], MD;  Location: [facility];  Service: Plastics;  Laterality: N/A;  . EXCISION OF ABDOMINAL WALL TUMOR N/A 10/03/2017   Procedure: EXCISION OF COLON CANCER TUMOR FROM ABDOMINAL;  Surgeon: Stark Klein, MD;  Location: [facility];  Service: General;  Laterality: N/A;  TAP BLOCK  . INCISION AND DRAINAGE OF WOUND N/A 11/14/2017   Procedure: IRRIGATION AND DEBRIDEMENT OF ABDOMINAL WALL;  Surgeon: [name], MD;  Location: [facility];  Service: Plastics;  Laterality: N/A;  . IR FLUORO GUIDE PORT INSERTION RIGHT  05/09/2017  . IR US GUIDE VASC ACCESS RIGHT  05/09/2017   port-a-cath      Family History  Problem Relation Age of Onset  . Cancer Neg Hx     Social History:  reports that she has been smoking cigarettes.  She has a 45.00 pack-year smoking history. she has never used smokeless tobacco. She reports  that she drinks about 1.2 - 2.4 oz of alcohol per week. She reports that she does not use drugs.  Allergies:  Allergies  Allergen Reactions  . Penicillins Other (See Comments)    "PASSED OUT AS A CHILD" > ? SYNCOPE ? PATIENT HAS HAD A PCN REACTION WITH IMMEDIATE RASH, FACIAL/TONGUE/THROAT SWELLING, SOB, OR LIGHTHEADEDNESS WITH HYPOTENSION:  #  #  #  YES  #  #  #   Has patient had a PCN reaction causing severe rash involving mucus membranes or skin necrosis: No Has patient had a PCN reaction that required hospitalization: No Has patient had a PCN reaction occurring within the last 10 years: No   . Latex Itching and Rash    BED "CHUX PADS"    Medications: I have reviewed the patient's current medications.  Results for orders placed or performed during the hospital encounter of 12/30/17 (from the past 48 hour(s))  Urinalysis, Routine w reflex microscopic     Status: Abnormal   Collection Time: 12/30/17  8:17 AM  Result Value Ref Range   Color, Urine YELLOW YELLOW   APPearance CLEAR CLEAR   Specific Gravity, Urine 1.012 1.005 - 1.030   pH 7.0 5.0 - 8.0   Glucose, UA NEGATIVE NEGATIVE mg/dL   Hgb urine dipstick NEGATIVE NEGATIVE   Bilirubin Urine NEGATIVE NEGATIVE   Ketones, ur 5 (A) NEGATIVE mg/dL   Protein, ur NEGATIVE NEGATIVE mg/dL   Nitrite NEGATIVE NEGATIVE  Leukocytes, UA NEGATIVE NEGATIVE  Comprehensive metabolic panel     Status: Abnormal   Collection Time: 12/30/17  8:37 AM  Result Value Ref Range   Sodium 135 135 - 145 mmol/L   Potassium 3.6 3.5 - 5.1 mmol/L   Chloride 96 (L) 101 - 111 mmol/L   CO2 25 22 - 32 mmol/L   Glucose, Bld 115 (H) 65 - 99 mg/dL   BUN 14 6 - 20 mg/dL   Creatinine, Ser 0.55 0.44 - 1.00 mg/dL   Calcium 8.3 (L) 8.9 - 10.3 mg/dL   Total Protein 6.2 (L) 6.5 - 8.1 g/dL   Albumin 2.5 (L) 3.5 - 5.0 g/dL   AST 23 15 - 41 U/L   ALT 18 14 - 54 U/L   Alkaline Phosphatase 128 (H) 38 - 126 U/L   Total Bilirubin 0.5 0.3 - 1.2 mg/dL   GFR calc non Af  Amer >60 >60 mL/min   GFR calc Af Amer >60 >60 mL/min    Comment: (NOTE) The eGFR has been calculated using the CKD EPI equation. This calculation has not been validated in all clinical situations. eGFR's persistently <60 mL/min signify possible Chronic Kidney Disease.    Anion gap 14 5 - 15  CBC with Differential     Status: Abnormal   Collection Time: 12/30/17  8:37 AM  Result Value Ref Range   WBC 12.8 (H) 4.0 - 10.5 K/uL   RBC 3.77 (L) 3.87 - 5.11 MIL/uL   Hemoglobin 10.3 (L) 12.0 - 15.0 g/dL   HCT 33.4 (L) 36.0 - 46.0 %   MCV 88.6 78.0 - 100.0 fL   MCH 27.3 26.0 - 34.0 pg   MCHC 30.8 30.0 - 36.0 g/dL   RDW 16.2 (H) 11.5 - 15.5 %   Platelets 363 150 - 400 K/uL   Neutrophils Relative % 87 %   Neutro Abs 11.2 (H) 1.7 - 7.7 K/uL   Lymphocytes Relative 9 %   Lymphs Abs 1.1 0.7 - 4.0 K/uL   Monocytes Relative 4 %   Monocytes Absolute 0.5 0.1 - 1.0 K/uL   Eosinophils Relative 0 %   Eosinophils Absolute 0.0 0.0 - 0.7 K/uL   Basophils Relative 0 %   Basophils Absolute 0.0 0.0 - 0.1 K/uL  I-Stat CG4 Lactic Acid, ED     Status: None   Collection Time: 12/30/17  8:44 AM  Result Value Ref Range   Lactic Acid, Venous 1.26 0.5 - 1.9 mmol/L  Wound or Superficial Culture     Status: None (Preliminary result)   Collection Time: 12/30/17 10:45 AM  Result Value Ref Range   Specimen Description ABSCESS    Special Requests NONE    Gram Stain      MODERATE WBC PRESENT, PREDOMINANTLY PMN ABUNDANT GRAM NEGATIVE RODS ABUNDANT GRAM POSITIVE COCCI IN PAIRS IN CLUSTERS FEW GRAM POSITIVE RODS    Culture PENDING    Report Status PENDING   Basic metabolic panel     Status: Abnormal   Collection Time: 12/31/17  5:17 AM  Result Value Ref Range   Sodium 134 (L) 135 - 145 mmol/L   Potassium 3.4 (L) 3.5 - 5.1 mmol/L   Chloride 101 101 - 111 mmol/L   CO2 27 22 - 32 mmol/L   Glucose, Bld 105 (H) 65 - 99 mg/dL   BUN 6 6 - 20 mg/dL   Creatinine, Ser 0.48 0.44 - 1.00 mg/dL   Calcium 7.8 (L) 8.9  - 10.3 mg/dL  GFR calc non Af Amer >60 >60 mL/min   GFR calc Af Amer >60 >60 mL/min    Comment: (NOTE) The eGFR has been calculated using the CKD EPI equation. This calculation has not been validated in all clinical situations. eGFR's persistently <60 mL/min signify possible Chronic Kidney Disease.    Anion gap 6 5 - 15  CBC     Status: Abnormal   Collection Time: 12/31/17  5:17 AM  Result Value Ref Range   WBC 9.3 4.0 - 10.5 K/uL   RBC 3.08 (L) 3.87 - 5.11 MIL/uL   Hemoglobin 8.6 (L) 12.0 - 15.0 g/dL   HCT 27.4 (L) 36.0 - 46.0 %   MCV 89.0 78.0 - 100.0 fL   MCH 27.9 26.0 - 34.0 pg   MCHC 31.4 30.0 - 36.0 g/dL   RDW 16.4 (H) 11.5 - 15.5 %   Platelets 330 150 - 400 K/uL    Ct Abdomen Pelvis W Contrast  Result Date: 12/30/2017 CLINICAL DATA:  Lower abdominal pain. History of prior surgery to remove a large anterior pelvic mass. EXAM: CT ABDOMEN AND PELVIS WITH CONTRAST TECHNIQUE: Multidetector CT imaging of the abdomen and pelvis was performed using the standard protocol following bolus administration of intravenous contrast. CONTRAST:  190m ISOVUE-300 IOPAMIDOL (ISOVUE-300) INJECTION 61% COMPARISON:  CT scan 07/21/2017 FINDINGS: Lower chest: The lung bases are clear except for dependent bibasilar atelectasis. The heart is normal in size. No pericardial effusion. Hepatobiliary: Small scattered calcified granulomas. No worrisome hepatic lesions or intrahepatic biliary dilatation. The gallbladder is normal. No common bile duct dilatation. Pancreas: No mass, inflammation or ductal dilatation. Spleen: Numerous calcified granulomas. Adrenals/Urinary Tract: The adrenal glands and kidneys are unremarkable. Moderate anterior wall thickening of the bladder likely due to the overlying inflammation/infection. Stomach/Bowel: The stomach, duodenum, small bowel and colon are grossly normal in the abdomen. In the pelvis there are small bowel loops right up underneath the anterior abdominal wall with there  is a large soft tissue defect from prior surgery. I do not see any definite leaking oral contrast but findings suspicious for small bowel fistula. There is a tract of air in fluid in the anterior pelvic wall in the midline and extending into the right inguinal area. There is also air and fluid in the anterior abdominal wound. Vascular/Lymphatic: Stable atherosclerotic calcifications involving the aorta and branch vessels. No aneurysm or dissection. The major venous structures are patent. Numerous mesenteric and retroperitoneal lymph nodes likely inflammatory/hyperplastic. Reproductive: The uterus is normal. Moderate retroversion. There is a stable left ovarian dermoid measuring 3.7 cm. Other: No free intrapelvic fluid collections Musculoskeletal: CT findings consistent with septic arthritis involving the pubic symphysis without obvious destructive bony changes but most likely associated osteomyelitis. IMPRESSION: 1. Anterior abdominal wound containing fluid and air along with a probable sinus track extending to the right inguinal area. Findings suspicious for a small bowel fistula although I do not see any obvious leaking oral contrast. 2. Septic arthritis involving the pubic symphysis. 3. Moderate wall thickening of the anterior bladder likely due to the adjacent inflammatory process. 4. Left ovarian dermoid. Electronically Signed   By: PMarijo SanesM.D.   On: 12/30/2017 14:00   Dg Hip Unilat W Or Wo Pelvis 2-3 Views Right  Result Date: 12/30/2017 CLINICAL DATA:  Right hip pain, no known injury, initial encounter EXAM: DG HIP (WITH OR WITHOUT PELVIS) 2-3V RIGHT COMPARISON:  None. FINDINGS: There is no evidence of hip fracture or dislocation. There is no evidence of arthropathy  or other focal bone abnormality. IMPRESSION: No acute abnormality noted. Electronically Signed   By: Inez Catalina M.D.   On: 12/30/2017 10:22   Dg Femur Min 2 Views Right  Result Date: 12/30/2017 CLINICAL DATA:  Right leg pain, no  known injury, initial encounter EXAM: RIGHT FEMUR 2 VIEWS COMPARISON:  None. FINDINGS: There is no evidence of fracture or other focal bone lesions. Soft tissues are unremarkable. IMPRESSION: No acute abnormality noted. Electronically Signed   By: Inez Catalina M.D.   On: 12/30/2017 10:27    Review of Systems  Constitutional: Positive for fever. Negative for chills and weight loss.  HENT: Negative for ear discharge, ear pain, hearing loss and tinnitus.   Eyes: Negative for blurred vision, double vision, photophobia and pain.  Respiratory: Negative for cough, sputum production and shortness of breath.   Cardiovascular: Negative for chest pain.  Gastrointestinal: Negative for abdominal pain, nausea and vomiting.  Genitourinary: Negative for dysuria, flank pain, frequency and urgency.  Musculoskeletal: Positive for joint pain (Right hip/thigh/buttock). Negative for back pain, falls, myalgias and neck pain.  Neurological: Negative for dizziness, tingling, sensory change, focal weakness, loss of consciousness and headaches.  Endo/Heme/Allergies: Does not bruise/bleed easily.  Psychiatric/Behavioral: Negative for depression, memory loss and substance abuse. The patient is not nervous/anxious.    Blood pressure (!) 103/58, pulse 91, temperature 98.4 F (36.9 C), temperature source Oral, resp. rate 18, height '5\' 6"'$  (1.676 m), weight 51.8 kg (114 lb 3.2 oz), SpO2 97 %. Physical Exam  Constitutional: She appears well-developed. No distress.  HENT:  Head: Normocephalic.  Eyes: Conjunctivae are normal. Right eye exhibits no discharge. Left eye exhibits no discharge. No scleral icterus.  Neck: Normal range of motion.  Cardiovascular: Normal rate and regular rhythm.  Respiratory: Effort normal. No respiratory distress.  GI:    Musculoskeletal:  RLE No traumatic wounds, ecchymosis, or rash  Moderate TTP lateral hip, symphisis, mild TTP with AP/lateral compression  No knee or ankle effusion  Knee  stable to varus/ valgus and anterior/posterior stress  Sens DPN, SPN, TN intact  Motor EHL, ext, flex, evers 5/5  DP 2+, PT 2+, No significant edema  Neurological: She is alert.  Skin: Skin is warm and dry. She is not diaphoretic.  Psychiatric: She has a normal mood and affect. Her behavior is normal.    Assessment/Plan: Pubic symphysis osteo -- Agree with plastics plan to get bone bx tomorrow during planned surgical procedure. Should this be unsuccessful could attempt aspiration via CT guidance by IR but much less optimal. Would try to avoid formal debridement in this independently ambulatory woman as it can lead to pubis instability, especially as there is no sign of bony destruction at this point. Agree with ID involvement, their note is pending at this time. Dr. Stann Mainland available for questions if needed.    Lisette Abu, PA-C Orthopedic Surgery [phone] 12/31/2017, 1:50 PM

## 2017-12-31 NOTE — Progress Notes (Addendum)
  Plastic Surgery  Events noted. Patient with history colon cancer initially resected in [facility] 2016. Represented this year with large abdominal wall mass. Underwent neoadjuvant chemoradiation followed by abdominal wall tumor resection and excision right inguinal metastatic LN with immediate [name] reconstruction. Patient continued to smoke through perioperative period and experienced skin flap necrosis with exposure [name]. She underwent debridement abdominal wall with removal near entirety [name]. She was treated with VAC and initially had progress wound with resolution all undermining areas. However has developed over last few weeks persistent wound at 6 o clock that has tracked to bone. This did not improve with white foam/VAC and was held last week. Admitted for worsening pain, unable to ambulate, and drainage. CT findings noted.  Temp:  [98 F (36.7 C)-98.6 F (37 C)] 98 F (36.7 C) (01/02 0500) Pulse Rate:  [58-96] 80 (01/02 0500) Resp:  [16-20] 20 (01/02 0500) BP: (100-131)/(56-77) 100/56 (01/02 0500) SpO2:  [94 %-100 %] 95 % (01/02 0500) Weight:  [49.9 kg (110 lb)-51.8 kg (114 lb 3.2 oz)] 51.8 kg (114 lb 3.2 oz) (01/01 1719)   PE: Exam unchanged from last exam 1 week ago, at 57 o clock wound undermining up to 2.5cm, base of wound not visible but hard indicating bone significant amount yellow drainage, no cellulitis, abdomen otherwise soft 100% granulation remainder wound with evidence neo epithlization surrounding remainder wound   A/P Septic arthritis /osteomyelitis pubic bone with additional fluid noted over right groin-her abdominal wall tumor was adherent to pubic bone and she had large metastatic LN resected from right groin. In my opinion cannot exclude tumor recurrence in these areas.   Significant drainage wound. Plan duoderm/hydrocolloid to protect surrounding skin. Dakins packing for now. Will discuss with Dr. Barry Dienes operative debridement and replacement  VAC.  Restart protein supplementation.   [name], MD [facility] [phone], pin [phone]   ADDENDUM: ID recs noted. Discussed with Dr. Barry Dienes. Will plan OR in am 1.3.19 for wash out, attempt bone biopsy and possible VAC placement. NPO past MN.

## 2017-12-31 NOTE — Progress Notes (Signed)
PROGRESS NOTE    Yolanda Watson  KGM:[id number] DOB: 1951-12-09 DOA: 12/30/2017 PCP: Patient, No Pcp Per   Brief Narrative: Yolanda Watson is a 67 y.o. female with medical history significant of metastatic colon cancer to the abdominal wall s/p recent resection and reconstruction presenting with wound drainage.    Assessment & Plan:   Principal Problem:   Cancer of right colon [facility]) Active Problems:   Open wound of abdomen   Osteomyelitis ([place])   Osteomyelitis/Septic arthritis of pubic symphysis Abdominal wound Patient follows with general and plastic surgery as an outpatient. No elevated WBC and afebrile. Stable. Antibiotics started on admission -Infectious disease recommendations -Continue antibiotics -Plastic surgery recommendations -Orthopedic surgery consult  History of metastatic colon cancer S/p FOLFOX x5. Pathology (10/03/17) positive for adenocarcinoma. -Plastic surgery recommendations -Palliative care medicine consult pending   DVT prophylaxis: Lovenox Code Status: DNR Family Communication: None at bedside Disposition Plan: When medically stable   Consultants:   Plastic surgery  General surgery  Infectious disease  Procedures:   None  Antimicrobials:  Vancomycin (1/1 >>  Cefepime (1/1 >>  Metronidazole (1/1 >>    Subjective: No concerns overnight. Hungry. Afebrile.  Objective: Vitals:   12/30/17 1700 12/30/17 1719 12/30/17 2045 12/31/17 0500  BP: 125/72 123/68 110/69 (!) 100/56  Pulse: 88 91 87 80  Resp: 18 18 18 20   Temp:  98.2 F (36.8 C) 98.5 F (36.9 C) 98 F (36.7 C)  TempSrc:  Oral Oral Oral  SpO2: 95% 97% 99% 95%  Weight:  51.8 kg (114 lb 3.2 oz)    Height:  5\' 6"  (1.676 m)      Intake/Output Summary (Last 24 hours) at 12/31/2017 1052 Last data filed at 12/31/2017 0500 Gross per 24 hour  Intake 2823.33 ml  Output 250 ml  Net 2573.33 ml   Filed Weights   12/30/17 0752 12/30/17 1719  Weight: 49.9 kg (110 lb) 51.8 kg (114  lb 3.2 oz)    Examination:  General exam: Appears calm and comfortable Respiratory system: Clear to auscultation. Respiratory effort normal. Cardiovascular system: S1 & S2 heard, RRR. No murmurs, rubs, gallops or clicks. Gastrointestinal system: Abdomen is nondistended, soft and nontender. No organomegaly or masses felt. Normal bowel sounds heard. Dressing is clean. Central nervous system: Alert and oriented. No focal neurological deficits. Extremities: No edema. No calf tenderness Skin: No cyanosis. No rashes Psychiatry: Judgement and insight appear normal. Mood & affect appropriate.     Data Reviewed: I have personally reviewed following labs and imaging studies  CBC: Recent Labs  Lab 12/30/17 0837 12/31/17 0517  WBC 12.8* 9.3  NEUTROABS 11.2*  --   HGB 10.3* 8.6*  HCT 33.4* 27.4*  MCV 88.6 89.0  PLT 363 644   Basic Metabolic Panel: Recent Labs  Lab 12/30/17 0837 12/31/17 0517  NA 135 134*  K 3.6 3.4*  CL 96* 101  CO2 25 27  GLUCOSE 115* 105*  BUN 14 6  CREATININE 0.55 0.48  CALCIUM 8.3* 7.8*   GFR: Estimated Creatinine Clearance: 57.3 mL/min (by C-G formula based on SCr of 0.48 mg/dL). Liver Function Tests: Recent Labs  Lab 12/30/17 0837  AST 23  ALT 18  ALKPHOS 128*  BILITOT 0.5  PROT 6.2*  ALBUMIN 2.5*   No results for input(s): LIPASE, AMYLASE in the last 168 hours. No results for input(s): AMMONIA in the last 168 hours. Coagulation Profile: No results for input(s): INR, PROTIME in the last 168 hours. Cardiac Enzymes: No results  for input(s): CKTOTAL, CKMB, CKMBINDEX, TROPONINI in the last 168 hours. BNP (last 3 results) No results for input(s): PROBNP in the last 8760 hours. HbA1C: No results for input(s): HGBA1C in the last 72 hours. CBG: No results for input(s): GLUCAP in the last 168 hours. Lipid Profile: No results for input(s): CHOL, HDL, LDLCALC, TRIG, CHOLHDL, LDLDIRECT in the last 72 hours. Thyroid Function Tests: No results for  input(s): TSH, T4TOTAL, FREET4, T3FREE, THYROIDAB in the last 72 hours. Anemia Panel: No results for input(s): VITAMINB12, FOLATE, FERRITIN, TIBC, IRON, RETICCTPCT in the last 72 hours. Sepsis Labs: Recent Labs  Lab 12/30/17 0844  LATICACIDVEN 1.26    Recent Results (from the past 240 hour(s))  Wound or Superficial Culture     Status: None (Preliminary result)   Collection Time: 12/30/17 10:45 AM  Result Value Ref Range Status   Specimen Description ABSCESS  Final   Special Requests NONE  Final   Gram Stain   Final    MODERATE WBC PRESENT, PREDOMINANTLY PMN ABUNDANT GRAM NEGATIVE RODS ABUNDANT GRAM POSITIVE COCCI IN PAIRS IN CLUSTERS FEW GRAM POSITIVE RODS    Culture PENDING  Incomplete   Report Status PENDING  Incomplete         Radiology Studies: Ct Abdomen Pelvis W Contrast  Result Date: 12/30/2017 CLINICAL DATA:  Lower abdominal pain. History of prior surgery to remove a large anterior pelvic mass. EXAM: CT ABDOMEN AND PELVIS WITH CONTRAST TECHNIQUE: Multidetector CT imaging of the abdomen and pelvis was performed using the standard protocol following bolus administration of intravenous contrast. CONTRAST:  19mL ISOVUE-300 IOPAMIDOL (ISOVUE-300) INJECTION 61% COMPARISON:  CT scan 07/21/2017 FINDINGS: Lower chest: The lung bases are clear except for dependent bibasilar atelectasis. The heart is normal in size. No pericardial effusion. Hepatobiliary: Small scattered calcified granulomas. No worrisome hepatic lesions or intrahepatic biliary dilatation. The gallbladder is normal. No common bile duct dilatation. Pancreas: No mass, inflammation or ductal dilatation. Spleen: Numerous calcified granulomas. Adrenals/Urinary Tract: The adrenal glands and kidneys are unremarkable. Moderate anterior wall thickening of the bladder likely due to the overlying inflammation/infection. Stomach/Bowel: The stomach, duodenum, small bowel and colon are grossly normal in the abdomen. In the pelvis  there are small bowel loops right up underneath the anterior abdominal wall with there is a large soft tissue defect from prior surgery. I do not see any definite leaking oral contrast but findings suspicious for small bowel fistula. There is a tract of air in fluid in the anterior pelvic wall in the midline and extending into the right inguinal area. There is also air and fluid in the anterior abdominal wound. Vascular/Lymphatic: Stable atherosclerotic calcifications involving the aorta and branch vessels. No aneurysm or dissection. The major venous structures are patent. Numerous mesenteric and retroperitoneal lymph nodes likely inflammatory/hyperplastic. Reproductive: The uterus is normal. Moderate retroversion. There is a stable left ovarian dermoid measuring 3.7 cm. Other: No free intrapelvic fluid collections Musculoskeletal: CT findings consistent with septic arthritis involving the pubic symphysis without obvious destructive bony changes but most likely associated osteomyelitis. IMPRESSION: 1. Anterior abdominal wound containing fluid and air along with a probable sinus track extending to the right inguinal area. Findings suspicious for a small bowel fistula although I do not see any obvious leaking oral contrast. 2. Septic arthritis involving the pubic symphysis. 3. Moderate wall thickening of the anterior bladder likely due to the adjacent inflammatory process. 4. Left ovarian dermoid. Electronically Signed   By: Marijo Sanes M.D.   On: 12/30/2017 14:00  Dg Hip Unilat W Or Wo Pelvis 2-3 Views Right  Result Date: 12/30/2017 CLINICAL DATA:  Right hip pain, no known injury, initial encounter EXAM: DG HIP (WITH OR WITHOUT PELVIS) 2-3V RIGHT COMPARISON:  None. FINDINGS: There is no evidence of hip fracture or dislocation. There is no evidence of arthropathy or other focal bone abnormality. IMPRESSION: No acute abnormality noted. Electronically Signed   By: Inez Catalina M.D.   On: 12/30/2017 10:22   Dg  Femur Min 2 Views Right  Result Date: 12/30/2017 CLINICAL DATA:  Right leg pain, no known injury, initial encounter EXAM: RIGHT FEMUR 2 VIEWS COMPARISON:  None. FINDINGS: There is no evidence of fracture or other focal bone lesions. Soft tissues are unremarkable. IMPRESSION: No acute abnormality noted. Electronically Signed   By: Inez Catalina M.D.   On: 12/30/2017 10:27        Scheduled Meds: . docusate sodium  100 mg Oral BID  . mouth rinse  15 mL Mouth Rinse BID  . sodium chloride flush  10-40 mL Intracatheter Q12H  . sodium hypochlorite   Irrigation BID   Continuous Infusions: . ceFEPime (MAXIPIME) IV Stopped (12/31/17 0205)  . lactated ringers 100 mL/hr at 12/31/17 0334  . metronidazole Stopped (12/31/17 0356)  . vancomycin Stopped (12/31/17 0356)     LOS: 1 day     Cordelia Poche, MD [facility] 12/31/2017, 10:52 AM Pager: [phone]  If 7PM-7AM, please contact night-coverage [email or web address] Password [facility] 12/31/2017, 10:52 AM

## 2017-12-31 NOTE — Consult Note (Signed)
[place] for Infectious Disease    Date of Admission:  12/30/2017     Total days of antibiotics 1  Vancomycin Day 1   Cefepime Day 1   Metronidazole Day 1         Reason for Consult: Pelvic osteoarthritis/Septic Arthritis in setting of non-healing surgical wound Referring Provider: Dr. Lonny Prude / [name].    Assessment: 67 y.o. female with history of metastatic colon cancer s/p resection, chemo, radiation with chronic non-healing surgical wound to right lower abdominal wall and newly diagnosed septic arthritis of symphysis pubis. Likely the abdominal wound is mixed aerobic/anaerobic wound however there may be a predominating pathogen causing her pelvic septic arthritis. She has a listed PCN allergy per a syncopal event that happened when she was 67 years old.   Plan:  1. Would stop antibiotics now with anticipation of further surgery for debridement to allow for highest yield of pathogen information. She is currently and has been hemodynamically stable in setting of this smoldering infection. Should she decompensate or show sings of hemodynamic compromise r/t infection OK to resume prior to surgery.  2. Would obtain biopsy of affected pelvic bone/tissue and send for aerobic/anaerobic culture and fungal culture.  3. Tolerating cephalosporins well. Not likely she has a true PCN allergy however she does not recall every receiving this to retrial.  4. After recovery of specimens would resume current empiric treatment for presumed mixed anaerobic/aerobic abdominal wound. Follow cultures and narrow as able.  5. Discussed with her and her family that typical treatment for septic arthritis will require 4 - 6 weeks of IV therapy to which we should be able to give via her chest port.    Thank you for allowing [place] to be a part in this patient's care. We will follow along with the patient during hospitalization    HPI: Yolanda Watson is a 67 y.o. female with a past history of  metastatic colon cancer s/p recent excision and reconstruction of abdominal wall (10/03/17 Dr. Barry Dienes and [name]) that has had trouble with non-healing surgical wound.   She was originally diagnosed with colon cancer in 2016 and underwent colectomy with temporary colostomy in [place]. Biopsy revealed adenocarcinoma. Over 10 lymph nodes were negative and wound margins were clean and therefore chemo was deferred. Initially she did well until April 2018 when she developed abdominal fullness near incision line. Follow up CT revealed large heterogenous anterior pelvic wall mass with a smaller lesion in the right anterior inguinal area. At this time she underwent neoadjuvant chemotherapy which she completed in July 2018. Repeat CT showed only mild decrease (9.3 x 12.4 x 11.3 cm from 10.5 x 13.3 x 11.5 cm). Neoadjuvant radiation was added in preparation for her planned resection which she underwent this October. She has followed closely with [name] outpatient and has required debridements of abd wall wound in setting of poor wound healing/necrosis with wound vac placements. Upon review of these visits it does not appear she was given any antibiotics prior to her admission today, however Yolanda Watson reports a 5 day course of "some antibiotic" that she was given about 3.5 weeks ago by her estimate.   Hospital Course: She was brought to the ED on 12/30/17 for further evaluation as she developed sudden onset sharp pain in her right hip/thigh for almost one week with green discharge from wound site. Abdominal/pelvic CT was obtained and demonstrated anterior abdominal wound with fluid and probably sinus  tract into the right inguinal area which is suspicious for small bowel fistula (although no obvious leak of oral contrast on this study). Septic arthritis of pubic symphysis noted and she was started on vancomycin, cefepime and metronidazole.   Since admission she reports that her pain is still ongoing but under better  control. She reports no measured fevers at home however did have an isolated occurrence of drenching night sweat about 4 days ago. Wound drainage has increased and has a "very strong odor" to it. Last instructions regarding wound care consisted of cleaning/packing with gauze and Dakin's solution. She stated that up until the last week the wound was ''looking much better" and actually had red wound bed. She denies any weight loss or changes to her appetite.   Principal Problem:   Cancer of right colon [facility]) Active Problems:   Open wound of abdomen   Osteomyelitis ([place])  . docusate sodium  100 mg Oral BID  . enoxaparin (LOVENOX) injection  40 mg Subcutaneous Q24H  . mouth rinse  15 mL Mouth Rinse BID  . sodium chloride flush  10-40 mL Intracatheter Q12H  . sodium hypochlorite   Irrigation BID   Review of Systems: Review of Systems  Constitutional: Negative for chills and fever. Diaphoresis: one episode 4 nights ago   Respiratory: Negative for cough and shortness of breath.   Cardiovascular: Negative for chest pain and leg swelling.  Gastrointestinal: Negative for abdominal pain, diarrhea and vomiting.  Genitourinary: Negative for dysuria.  Musculoskeletal:       Unable to ambulate d/t pelvic/leg pain over the last 4-5 days  Skin: Negative for rash.  Neurological: Negative for dizziness and headaches.    Past Medical History:  Diagnosis Date  . Acquired bilateral hand deformity    both hads with severe digit contnracture. VERY limited ROM   . Alcohol abuse   . Colon cancer ([place]) 2016   metastatic to abdominal wall  . Port-A-Cath in place since 04-2017   right chest     Social History   Tobacco Use  . Smoking status: Current Every Day Smoker    Packs/day: 1.00    Years: 45.00    Pack years: 45.00    Types: Cigarettes  . Smokeless tobacco: Never Used  Substance Use Topics  . Alcohol use: Yes    Alcohol/week: 1.2 - 2.4 oz    Types: 2 - 4 Shots of liquor per week     Comment: denies problem with ETOH but contineus to drink "a couple of drinks per day"  . Drug use: No    Family History  Problem Relation Age of Onset  . Cancer Neg Hx    Allergies  Allergen Reactions  . Latex Itching and Rash    BED "CHUX PADS"  . Penicillins Other (See Comments)    Passed out as a child Has patient had a PCN reaction causing immediate rash, facial/tongue/throat swelling, SOB or lightheadedness with hypotension: Yes Has patient had a PCN reaction causing severe rash involving mucus membranes or skin necrosis: No Has patient had a PCN reaction that required hospitalization: No Has patient had a PCN reaction occurring within the last 10 years: No If all of the above answers are "NO", then may proceed with Cephalosporin use.     OBJECTIVE: Blood pressure (!) 100/56, pulse 80, temperature 98 F (36.7 C), temperature source Oral, resp. rate 20, height 5\' 6"  (1.676 m), weight 114 lb 3.2 oz (51.8 kg), SpO2 95 %.  Physical Exam  Constitutional: She is oriented to person, place, and time. Vital signs are normal.  Pleasant. Sitting upright in bed eating lunch with family present.   HENT:  Mouth/Throat: Oropharynx is clear and moist.  Eyes: No scleral icterus.  Cardiovascular: Normal rate, regular rhythm and normal heart sounds.  Pulmonary/Chest: Effort normal and breath sounds normal. No respiratory distress. She has no rales.  Abdominal: Soft. Bowel sounds are normal. She exhibits no distension.  Left lower abd/pelvic wound approximately 2 - 3 cm in diameter with green/tan drainage pooled in wound bed; strong odor present. Inferior to a larger area that appears to have mesh application overlying it.   Musculoskeletal:  Deferred assessing for pelvic instability as she is visibly uncomfortable moving around in the bed.   Lymphadenopathy:    She has no cervical adenopathy.  Neurological: She is alert and oriented to person, place, and time.  Skin: Skin is warm and dry.    Psychiatric: Mood, affect and judgment normal.  Vitals reviewed. Chest Port, Right: Clean, dry and intact. Currently infusing.   Lab Results Lab Results  Component Value Date   WBC 9.3 12/31/2017   HGB 8.6 (L) 12/31/2017   HCT 27.4 (L) 12/31/2017   MCV 89.0 12/31/2017   PLT 330 12/31/2017    Lab Results  Component Value Date   CREATININE 0.48 12/31/2017   BUN 6 12/31/2017   NA 134 (L) 12/31/2017   K 3.4 (L) 12/31/2017   CL 101 12/31/2017   CO2 27 12/31/2017    Lab Results  Component Value Date   ALT 18 12/30/2017   AST 23 12/30/2017   ALKPHOS 128 (H) 12/30/2017   BILITOT 0.5 12/30/2017     Microbiology: Recent Results (from the past 240 hour(s))  Wound or Superficial Culture     Status: None (Preliminary result)   Collection Time: 12/30/17 10:45 AM  Result Value Ref Range Status   Specimen Description ABSCESS  Final   Special Requests NONE  Final   Gram Stain   Final    MODERATE WBC PRESENT, PREDOMINANTLY PMN ABUNDANT GRAM NEGATIVE RODS ABUNDANT GRAM POSITIVE COCCI IN PAIRS IN CLUSTERS FEW GRAM POSITIVE RODS    Culture PENDING  Incomplete   Report Status PENDING  Incomplete    Janene Madeira, MSN, NP-C [place] for Infectious Disease [facility] Cell: [phone] Pager: [phone]  12/31/2017 11:40 AM

## 2017-12-31 NOTE — H&P (View-Only) (Signed)
  Plastic Surgery  Events noted. Patient with history colon cancer initially resected in [place] 2016. Represented this year with large abdominal wall mass. Underwent neoadjuvant chemoradiation followed by abdominal wall tumor resection and excision right inguinal metastatic LN with immediate [name] reconstruction. Patient continued to smoke through perioperative period and experienced skin flap necrosis with exposure [name]. She underwent debridement abdominal wall with removal near entirety [name]. She was treated with VAC and initially had progress wound with resolution all undermining areas. However has developed over last few weeks persistent wound at 6 o clock that has tracked to bone. This did not improve with white foam/VAC and was held last week. Admitted for worsening pain, unable to ambulate, and drainage. CT findings noted.  Temp:  [98 F (36.7 C)-98.6 F (37 C)] 98 F (36.7 C) (01/02 0500) Pulse Rate:  [58-96] 80 (01/02 0500) Resp:  [16-20] 20 (01/02 0500) BP: (100-131)/(56-77) 100/56 (01/02 0500) SpO2:  [94 %-100 %] 95 % (01/02 0500) Weight:  [49.9 kg (110 lb)-51.8 kg (114 lb 3.2 oz)] 51.8 kg (114 lb 3.2 oz) (01/01 1719)   PE: Exam unchanged from last exam 1 week ago, at 69 o clock wound undermining up to 2.5cm, base of wound not visible but hard indicating bone significant amount yellow drainage, no cellulitis, abdomen otherwise soft 100% granulation remainder wound with evidence neo epithlization surrounding remainder wound   A/P Septic arthritis /osteomyelitis pubic bone with additional fluid noted over right groin-her abdominal wall tumor was adherent to pubic bone and she had large metastatic LN resected from right groin. In my opinion cannot exclude tumor recurrence in these areas.   Significant drainage wound. Plan duoderm/hydrocolloid to protect surrounding skin. Dakins packing for now. Will discuss with Dr. Barry Dienes operative debridement and replacement  VAC.  Restart protein supplementation.   [name], MD [facility] Plastic & Reconstructive Surgery [phone], pin [phone]   ADDENDUM: ID recs noted. Discussed with Dr. Barry Dienes. Will plan OR in am 1.3.19 for wash out, attempt bone biopsy and possible VAC placement. NPO past MN.

## 2018-01-01 ENCOUNTER — Inpatient Hospital Stay (HOSPITAL_COMMUNITY): Payer: Medicare Other

## 2018-01-01 ENCOUNTER — Inpatient Hospital Stay (HOSPITAL_COMMUNITY): Payer: Medicare Other | Admitting: Certified Registered Nurse Anesthetist

## 2018-01-01 ENCOUNTER — Encounter (HOSPITAL_COMMUNITY)
Admission: EM | Disposition: A | Discharge status: Skilled Nursing Facility | Payer: Self-pay | Source: Home / Self Care | Attending: Internal Medicine

## 2018-01-01 DIAGNOSIS — T8141XA Infection following a procedure, superficial incisional surgical site, initial encounter: Secondary | ICD-10-CM

## 2018-01-01 DIAGNOSIS — M86159 Other acute osteomyelitis, unspecified femur: Secondary | ICD-10-CM

## 2018-01-01 DIAGNOSIS — Y838 Other surgical procedures as the cause of abnormal reaction of the patient, or of later complication, without mention of misadventure at the time of the procedure: Secondary | ICD-10-CM

## 2018-01-01 DIAGNOSIS — M868X8 Other osteomyelitis, other site: Secondary | ICD-10-CM

## 2018-01-01 DIAGNOSIS — L02211 Cutaneous abscess of abdominal wall: Secondary | ICD-10-CM

## 2018-01-01 DIAGNOSIS — Z880 Allergy status to penicillin: Secondary | ICD-10-CM

## 2018-01-01 DIAGNOSIS — Z91040 Latex allergy status: Secondary | ICD-10-CM

## 2018-01-01 DIAGNOSIS — M25559 Pain in unspecified hip: Secondary | ICD-10-CM

## 2018-01-01 DIAGNOSIS — T8149XA Infection following a procedure, other surgical site, initial encounter: Secondary | ICD-10-CM

## 2018-01-01 HISTORY — PX: DEBRIDEMENT AND CLOSURE WOUND: SHX5614

## 2018-01-01 LAB — CBC
HCT: 28.7 % — ABNORMAL LOW (ref 36.0–46.0)
HEMOGLOBIN: 8.8 g/dL — AB (ref 12.0–15.0)
MCH: 27.1 pg (ref 26.0–34.0)
MCHC: 30.7 g/dL (ref 30.0–36.0)
MCV: 88.3 fL (ref 78.0–100.0)
PLATELETS: 367 10*3/uL (ref 150–400)
RBC: 3.25 MIL/uL — AB (ref 3.87–5.11)
RDW: 16.4 % — ABNORMAL HIGH (ref 11.5–15.5)
WBC: 9.9 10*3/uL (ref 4.0–10.5)

## 2018-01-01 LAB — BASIC METABOLIC PANEL
Anion gap: 5 (ref 5–15)
CHLORIDE: 103 mmol/L (ref 101–111)
CO2: 28 mmol/L (ref 22–32)
CREATININE: 0.53 mg/dL (ref 0.44–1.00)
Calcium: 7.9 mg/dL — ABNORMAL LOW (ref 8.9–10.3)
Glucose, Bld: 96 mg/dL (ref 65–99)
Potassium: 3.4 mmol/L — ABNORMAL LOW (ref 3.5–5.1)
SODIUM: 136 mmol/L (ref 135–145)

## 2018-01-01 LAB — SURGICAL PCR SCREEN
MRSA, PCR: NEGATIVE
STAPHYLOCOCCUS AUREUS: NEGATIVE

## 2018-01-01 SURGERY — DEBRIDEMENT, WOUND, WITH CLOSURE
Anesthesia: General | Site: Abdomen

## 2018-01-01 MED ORDER — HYDROMORPHONE HCL 1 MG/ML IJ SOLN
0.2500 mg | INTRAMUSCULAR | Status: DC | PRN
  Administered 2018-01-01 (×2): 0.5 mg via INTRAVENOUS

## 2018-01-01 MED ORDER — ADULT MULTIVITAMIN W/MINERALS CH
1.0000 | ORAL_TABLET | Freq: Every day | ORAL | Status: DC
  Administered 2018-01-01 – 2018-01-05 (×5): 1 via ORAL
  Filled 2018-01-01 (×5): qty 1

## 2018-01-01 MED ORDER — MIDAZOLAM HCL 2 MG/2ML IJ SOLN
INTRAMUSCULAR | Status: DC | PRN
  Administered 2018-01-01 (×2): 1 mg via INTRAVENOUS

## 2018-01-01 MED ORDER — VITAMIN C 500 MG PO TABS
500.0000 mg | ORAL_TABLET | Freq: Every day | ORAL | Status: DC
  Administered 2018-01-01 – 2018-01-06 (×6): 500 mg via ORAL
  Filled 2018-01-01 (×6): qty 1

## 2018-01-01 MED ORDER — HYDROMORPHONE HCL 1 MG/ML IJ SOLN
0.5000 mg | Freq: Once | INTRAMUSCULAR | Status: AC
  Administered 2018-01-01: 0.5 mg via INTRAVENOUS
  Filled 2018-01-01: qty 1

## 2018-01-01 MED ORDER — PHENYLEPHRINE 40 MCG/ML (10ML) SYRINGE FOR IV PUSH (FOR BLOOD PRESSURE SUPPORT)
PREFILLED_SYRINGE | INTRAVENOUS | Status: DC | PRN
  Administered 2018-01-01: 80 ug via INTRAVENOUS

## 2018-01-01 MED ORDER — FENTANYL CITRATE (PF) 250 MCG/5ML IJ SOLN
INTRAMUSCULAR | Status: AC
  Filled 2018-01-01: qty 5

## 2018-01-01 MED ORDER — HYDROMORPHONE HCL 1 MG/ML IJ SOLN
INTRAMUSCULAR | Status: AC
  Filled 2018-01-01: qty 1

## 2018-01-01 MED ORDER — PROPOFOL 10 MG/ML IV BOLUS
INTRAVENOUS | Status: DC | PRN
  Administered 2018-01-01: 120 mg via INTRAVENOUS

## 2018-01-01 MED ORDER — OXYCODONE HCL 5 MG/5ML PO SOLN: 5.0000 mg | Freq: Once | ORAL | Status: DC | PRN

## 2018-01-01 MED ORDER — DEXTROSE 5 % IV SOLN
2.0000 g | Freq: Three times a day (TID) | INTRAVENOUS | Status: DC
  Administered 2018-01-01 – 2018-01-04 (×9): 2 g via INTRAVENOUS
  Filled 2018-01-01 (×11): qty 2

## 2018-01-01 MED ORDER — VANCOMYCIN HCL IN DEXTROSE 1-5 GM/200ML-% IV SOLN
1000.0000 mg | INTRAVENOUS | Status: DC
Start: 2018-01-01 — End: 2018-01-04
  Administered 2018-01-01 – 2018-01-03 (×3): 1000 mg via INTRAVENOUS
  Filled 2018-01-01 (×5): qty 200

## 2018-01-01 MED ORDER — LIDOCAINE 2% (20 MG/ML) 5 ML SYRINGE
INTRAMUSCULAR | Status: DC | PRN
  Administered 2018-01-01: 60 mg via INTRAVENOUS

## 2018-01-01 MED ORDER — MIDAZOLAM HCL 2 MG/2ML IJ SOLN
INTRAMUSCULAR | Status: AC
  Filled 2018-01-01: qty 2

## 2018-01-01 MED ORDER — SODIUM CHLORIDE 0.9 % IR SOLN
Status: DC | PRN
  Administered 2018-01-01: 3000 mL

## 2018-01-01 MED ORDER — MEPERIDINE HCL 25 MG/ML IJ SOLN: 6.2500 mg | INTRAMUSCULAR | Status: DC | PRN

## 2018-01-01 MED ORDER — DEXAMETHASONE SODIUM PHOSPHATE 10 MG/ML IJ SOLN
INTRAMUSCULAR | Status: DC | PRN
  Administered 2018-01-01: 4 mg via INTRAVENOUS

## 2018-01-01 MED ORDER — METRONIDAZOLE 500 MG PO TABS
500.0000 mg | ORAL_TABLET | Freq: Three times a day (TID) | ORAL | Status: DC
  Administered 2018-01-01 – 2018-01-04 (×9): 500 mg via ORAL
  Filled 2018-01-01 (×9): qty 1

## 2018-01-01 MED ORDER — ENSURE ENLIVE PO LIQD
237.0000 mL | Freq: Two times a day (BID) | ORAL | Status: DC
  Administered 2018-01-06: 237 mL via ORAL

## 2018-01-01 MED ORDER — SODIUM CHLORIDE 0.9 % IV SOLN
INTRAVENOUS | Status: DC | PRN
  Administered 2018-01-01: 500 mL

## 2018-01-01 MED ORDER — FENTANYL CITRATE (PF) 250 MCG/5ML IJ SOLN
INTRAMUSCULAR | Status: DC | PRN
  Administered 2018-01-01 (×3): 50 ug via INTRAVENOUS

## 2018-01-01 MED ORDER — PROSIGHT PO TABS
1.0000 | ORAL_TABLET | Freq: Every day | ORAL | Status: DC
  Administered 2018-01-02 – 2018-01-06 (×5): 1 via ORAL
  Filled 2018-01-01 (×6): qty 1

## 2018-01-01 MED ORDER — OXYCODONE HCL 5 MG PO TABS: 5.0000 mg | ORAL_TABLET | Freq: Once | ORAL | Status: DC | PRN

## 2018-01-01 MED ORDER — ONDANSETRON HCL 4 MG/2ML IJ SOLN
INTRAMUSCULAR | Status: DC | PRN
  Administered 2018-01-01: 4 mg via INTRAVENOUS

## 2018-01-01 MED ORDER — PROPOFOL 10 MG/ML IV BOLUS
INTRAVENOUS | Status: AC
  Filled 2018-01-01: qty 20

## 2018-01-01 MED ORDER — 0.9 % SODIUM CHLORIDE (POUR BTL) OPTIME
TOPICAL | Status: DC | PRN
  Administered 2018-01-01: 1000 mL

## 2018-01-01 MED ORDER — PROMETHAZINE HCL 25 MG/ML IJ SOLN: 6.2500 mg | INTRAMUSCULAR | Status: DC | PRN

## 2018-01-01 SURGICAL SUPPLY — 33 items
BANDAGE ACE 4X5 VEL STRL LF (GAUZE/BANDAGES/DRESSINGS) IMPLANT
BLADE 10 SAFETY STRL DISP (BLADE) ×2 IMPLANT
BLADE CLIPPER SURG (BLADE) IMPLANT
BNDG GAUZE ELAST 4 BULKY (GAUZE/BANDAGES/DRESSINGS) IMPLANT
CANISTER SUCT 3000ML PPV (MISCELLANEOUS) ×2 IMPLANT
CHLORAPREP W/TINT 26ML (MISCELLANEOUS) IMPLANT
COVER SURGICAL LIGHT HANDLE (MISCELLANEOUS) ×2 IMPLANT
DRAPE HALF SHEET 40X57 (DRAPES) IMPLANT
DRAPE ORTHO SPLIT 77X108 STRL (DRAPES)
DRAPE SURG ORHT 6 SPLT 77X108 (DRAPES) IMPLANT
DRESSING HYDROCOLLOID 4X4 XTH (GAUZE/BANDAGES/DRESSINGS) ×2 IMPLANT
DRSG PAD ABDOMINAL 8X10 ST (GAUZE/BANDAGES/DRESSINGS) IMPLANT
DRSG VAC ATS MED SENSATRAC (GAUZE/BANDAGES/DRESSINGS) ×2 IMPLANT
ELECT REM PT RETURN 9FT ADLT (ELECTROSURGICAL) ×2
ELECTRODE REM PT RTRN 9FT ADLT (ELECTROSURGICAL) ×1 IMPLANT
GAUZE SPONGE 4X4 12PLY STRL (GAUZE/BANDAGES/DRESSINGS) IMPLANT
GLOVE BIO SURGEON STRL SZ 6 (GLOVE) IMPLANT
GOWN STRL REUS W/ TWL LRG LVL3 (GOWN DISPOSABLE) ×2 IMPLANT
GOWN STRL REUS W/TWL LRG LVL3 (GOWN DISPOSABLE) ×2
HANDPIECE INTERPULSE COAX TIP (DISPOSABLE) ×1
KIT BASIN OR (CUSTOM PROCEDURE TRAY) ×2 IMPLANT
KIT ROOM TURNOVER OR (KITS) ×2 IMPLANT
NS IRRIG 1000ML POUR BTL (IV SOLUTION) ×2 IMPLANT
PACK GENERAL/GYN (CUSTOM PROCEDURE TRAY) ×2 IMPLANT
PAD ARMBOARD 7.5X6 YLW CONV (MISCELLANEOUS) ×4 IMPLANT
SET HNDPC FAN SPRY TIP SCT (DISPOSABLE) ×1 IMPLANT
SUCTION FRAZIER HANDLE 10FR (MISCELLANEOUS) ×1
SUCTION TUBE FRAZIER 10FR DISP (MISCELLANEOUS) ×1 IMPLANT
TOWEL OR 17X24 6PK STRL BLUE (TOWEL DISPOSABLE) ×2 IMPLANT
TOWEL OR 17X26 10 PK STRL BLUE (TOWEL DISPOSABLE) ×2 IMPLANT
UNDERPAD 30X30 (UNDERPADS AND DIAPERS) ×2 IMPLANT
WATER STERILE IRR 1000ML POUR (IV SOLUTION) IMPLANT
WND VAC CANISTER 500ML (MISCELLANEOUS) ×2 IMPLANT

## 2018-01-01 NOTE — Progress Notes (Signed)
Patient arrived back to 6n29, alert and oriented, moderate pain to mid abdomen, will medicate per orders. VSS, IV fluids infusing. New wound vac to mid abdomen running continuous. Purewick set up, family at bedside, will continue to monitor.

## 2018-01-01 NOTE — Progress Notes (Signed)
[place] Infectious [facility]  Date of Admission:  12/30/2017     Total days of antibiotics 1          Patient ID: Yolanda Watson is a 67 y.o. female with pubic symphisis septic arthritis and pelvic osteomyelitis in setting of contiguous chronic non-healing abdominal wall wound.  Principal Problem:   Cancer of right colon [facility]) Active Problems:   Osteomyelitis ([place])   Open wound of abdomen   . docusate sodium  100 mg Oral BID  . enoxaparin (LOVENOX) injection  40 mg Subcutaneous Q24H  . [START ON 01/02/2018] feeding supplement (ENSURE ENLIVE)  237 mL Oral BID BM  . HYDROmorphone      . mouth rinse  15 mL Mouth Rinse BID  . metroNIDAZOLE  500 mg Oral Q8H  . [START ON 01/02/2018] multivitamin  1 tablet Oral Daily  . multivitamin with minerals  1 tablet Oral Daily  . sodium chloride flush  10-40 mL Intracatheter Q12H  . vitamin C  500 mg Oral Daily    SUBJECTIVE: Underwent further surgical debridement today under the care of Dr.Byerly and [name]. Excisional debridement of skin and subcutaneous tissue/bone was performed. Grossly necrotic bone was observed at the pubic symphysis; 2 cm defect present present at pubic symphysis. Following debridement wound dimensions 11 x 7 cm with undermining at the 4-7 o'clock position to 4 cm. Gram stain of the tissue sample reflected similar polymicrobial pattern of superficial swab collection and bone GS with only fragments of organisms, non of which were discernable to guide therapy.   She is feeling well. Still with continued pain and inability to walk. Strong desire to go home but understands she will need physical therapy and possible acute rehab stay before that can happen. Denies fevers or chills. Dressing is clean and dry.   Review of Systems: Review of Systems  Constitutional: Negative for chills and fever.  HENT: Negative for tinnitus.   Eyes: Negative for blurred vision and photophobia.  Respiratory: Negative for  cough and sputum production.   Cardiovascular: Negative for chest pain.  Gastrointestinal: Negative for diarrhea, nausea and vomiting.  Genitourinary: Negative for dysuria.  Musculoskeletal: Positive for joint pain (hips/thigh).  Skin: Negative for rash.  Neurological: Negative for headaches.    Allergies  Allergen Reactions  . Penicillins Other (See Comments)    Tolerated cefepime 01/01/2018 "PASSED OUT AS A CHILD" > ? SYNCOPE ? PATIENT HAS HAD A PCN REACTION WITH IMMEDIATE RASH, FACIAL/TONGUE/THROAT SWELLING, SOB, OR LIGHTHEADEDNESS WITH HYPOTENSION:  #  #  #  YES  #  #  #   Has patient had a PCN reaction causing severe rash involving mucus membranes or skin necrosis: No Has patient had a PCN reaction that required hospitalization: No Has patient had a PCN reaction occurring within the last 10 years: No   . Latex Itching and Rash    BED "CHUX PADS"    OBJECTIVE: Vitals:   01/01/18 0845 01/01/18 0900 01/01/18 0913 01/01/18 1430  BP: 123/61 121/60 119/71 120/62  Pulse: 98 96 94 92  Resp: 10 14 14 15   Temp:  97.8 F (36.6 C) 98.4 F (36.9 C) 98.2 F (36.8 C)  TempSrc:   Oral Oral  SpO2: 93% 98% 93% 98%  Weight:      Height:       Body mass index is 18.43 kg/m.  Physical Exam  Constitutional: She is oriented to person, place, and time  and well-developed, well-nourished, and in no distress.  Pleasant and in good spirits lying in bed.   HENT:  Mouth/Throat: No oral lesions. No dental abscesses.  Eyes: Pupils are equal, round, and reactive to light. No scleral icterus.  Cardiovascular: Normal rate, regular rhythm and normal heart sounds.  Pulmonary/Chest: Effort normal and breath sounds normal.  Abdominal: Soft. She exhibits no distension. There is no tenderness.  Left lower quadrant / pelvic wound covered from surgery today. Not directly observed. Op notes reviewed.   Musculoskeletal: Normal range of motion. She exhibits no tenderness.  Lymphadenopathy:    She has no  cervical adenopathy.  Neurological: She is alert and oriented to person, place, and time.  Skin: Skin is warm and dry. No rash noted.  Psychiatric: Mood, affect and judgment normal.  Vitals reviewed.   Lab Results Lab Results  Component Value Date   WBC 9.9 01/01/2018   HGB 8.8 (L) 01/01/2018   HCT 28.7 (L) 01/01/2018   MCV 88.3 01/01/2018   PLT 367 01/01/2018    Lab Results  Component Value Date   CREATININE 0.53 01/01/2018   BUN <5 (L) 01/01/2018   NA 136 01/01/2018   K 3.4 (L) 01/01/2018   CL 103 01/01/2018   CO2 28 01/01/2018    Lab Results  Component Value Date   ALT 18 12/30/2017   AST 23 12/30/2017   ALKPHOS 128 (H) 12/30/2017   BILITOT 0.5 12/30/2017     Microbiology: Recent Results (from the past 240 hour(s))  Wound or Superficial Culture     Status: None (Preliminary result)   Collection Time: 12/30/17 10:45 AM  Result Value Ref Range Status   Specimen Description ABSCESS  Final   Special Requests NONE  Final   Gram Stain   Final    MODERATE WBC PRESENT, PREDOMINANTLY PMN ABUNDANT GRAM NEGATIVE RODS ABUNDANT GRAM POSITIVE COCCI IN PAIRS IN CLUSTERS FEW GRAM POSITIVE RODS    Culture CULTURE REINCUBATED FOR BETTER GROWTH  Final   Report Status PENDING  Incomplete  Surgical pcr screen     Status: None   Collection Time: 01/01/18 12:26 AM  Result Value Ref Range Status   MRSA, PCR NEGATIVE NEGATIVE Final   Staphylococcus aureus NEGATIVE NEGATIVE Final    Comment: (NOTE) The Xpert SA Assay (FDA approved for NASAL specimens in patients 77 years of age and older), is one component of a comprehensive surveillance program. It is not intended to diagnose infection nor to guide or monitor treatment.   Aerobic/Anaerobic Culture (surgical/deep wound)     Status: None (Preliminary result)   Collection Time: 01/01/18  7:51 AM  Result Value Ref Range Status   Specimen Description WOUND ABDOMEN  Final   Special Requests WOUND DEBRIDEMENT SPEC A  Final   Gram  Stain   Final    MODERATE WBC PRESENT,BOTH PMN AND MONONUCLEAR ABUNDANT GRAM POSITIVE COCCI IN PAIRS ABUNDANT GRAM NEGATIVE RODS FEW GRAM POSITIVE RODS Results Called to: S. Lyric Rossano AT 0347 ON 01/01/18 BY C. JESSUP, MLT.    Culture PENDING  Incomplete   Report Status PENDING  Incomplete  Aerobic/Anaerobic Culture (surgical/deep wound)     Status: None (Preliminary result)   Collection Time: 01/01/18  7:56 AM  Result Value Ref Range Status   Specimen Description BONE  Final   Special Requests ABDOMINAL WALL DEBRIDEMENT BONE SPEC B  Final   Gram Stain   Final    FEW WBC PRESENT, PREDOMINANTLY PMN NO ORGANISMS SEEN Results Called to:  S. Gerald Kuehl AT 8676 01/01/18 BY C. JEESUP, MLT.    Culture PENDING  Incomplete   Report Status PENDING  Incomplete   ASSESSMENT & PLAN:  1. Pubic Symphysis Septic Arthritis with Osteomyelitis =   Will restart Vancomycin, Cefepime and Flagyl as her gram stain still indicating polymicrobial infection. Considering the location of wound, chronicity of the wound, recent chemotherapy/radiation requirement and frequent need for health care treatment would like to cover empirically for MRSA and pseudomonas specifically in addition to other potential gram negative organisms and anaerobes.   Port-a-cath in place for OPAT use in preparation for 4 - 6 weeks of therapy.   Will likely require a stay in inpatient rehab to help ensure she is safe to return home and able to prevent further injury.   Will follow surgical cultures and consider narrowing as able.  Wound care per surgery team.   Janene Madeira, MSN, NP-C [facility] [place] Cell: [phone] Pager: [phone]  01/01/2018  5:18 PM

## 2018-01-01 NOTE — Progress Notes (Signed)
PROGRESS NOTE    Yolanda Watson  JIR:[id number] DOB: August 28, 1951 DOA: 12/30/2017 PCP: Patient, No Pcp Per   Brief Narrative: Yolanda Watson is a 67 y.o. female with medical history significant of metastatic colon cancer to the abdominal wall s/p recent resection and reconstruction presenting with wound drainage.    Assessment & Plan:   Principal Problem:   Cancer of right colon [facility]) Active Problems:   Open wound of abdomen   Osteomyelitis ([place])   Osteomyelitis/Septic arthritis of pubic symphysis Abdominal wound Patient follows with general and plastic surgery as an outpatient. No elevated WBC and afebrile. Stable. Antibiotics started on admission -Infectious disease recommendations -Continue antibiotics -Plastic surgery recommends debridement underwent that and tolerated it very well on 01/01/2018.  Follow-up on culture. -Orthopedic surgery consult  History of metastatic colon cancer S/p FOLFOX x5. Pathology (10/03/17) positive for adenocarcinoma. -Plastic surgery recommendations -Palliative care medicine consult completed, DNR/DNI   DVT prophylaxis: Lovenox Code Status: DNR Family Communication: None at bedside Disposition Plan: When medically stable   Consultants:   Plastic surgery  General surgery  Infectious disease  Procedures:   None  Antimicrobials:  Vancomycin (1/1 >>  Cefepime (1/1 >>  Metronidazole (1/1 >>    Subjective: No concerns overnight. Hungry. Afebrile.  Objective: Vitals:   01/01/18 0845 01/01/18 0900 01/01/18 0913 01/01/18 1430  BP: 123/61 121/60 119/71 120/62  Pulse: 98 96 94 92  Resp: 10 14 14 15   Temp:  97.8 F (36.6 C) 98.4 F (36.9 C) 98.2 F (36.8 C)  TempSrc:   Oral Oral  SpO2: 93% 98% 93% 98%  Weight:      Height:        Intake/Output Summary (Last 24 hours) at 01/01/2018 1743 Last data filed at 01/01/2018 1412 Gross per 24 hour  Intake 3211.67 ml  Output 1460 ml  Net 1751.67 ml   Filed Weights   12/30/17 0752  12/30/17 1719  Weight: 49.9 kg (110 lb) 51.8 kg (114 lb 3.2 oz)    Examination:  General exam: Appears calm and comfortable Respiratory system: Clear to auscultation. Respiratory effort normal. Cardiovascular system: S1 & S2 heard, RRR. No murmurs, rubs, gallops or clicks. Gastrointestinal system: Abdomen is nondistended, soft and nontender. No organomegaly or masses felt. Normal bowel sounds heard. Dressing is clean. Central nervous system: Alert and oriented. No focal neurological deficits. Extremities: No edema. No calf tenderness Skin: No cyanosis. No rashes Psychiatry: Judgement and insight appear normal. Mood & affect appropriate.     Data Reviewed: I have personally reviewed following labs and imaging studies  CBC: Recent Labs  Lab 12/30/17 0837 12/31/17 0517 01/01/18 0440  WBC 12.8* 9.3 9.9  NEUTROABS 11.2*  --   --   HGB 10.3* 8.6* 8.8*  HCT 33.4* 27.4* 28.7*  MCV 88.6 89.0 88.3  PLT 363 330 751   Basic Metabolic Panel: Recent Labs  Lab 12/30/17 0837 12/31/17 0517 01/01/18 0440  NA 135 134* 136  K 3.6 3.4* 3.4*  CL 96* 101 103  CO2 25 27 28   GLUCOSE 115* 105* 96  BUN 14 6 <5*  CREATININE 0.55 0.48 0.53  CALCIUM 8.3* 7.8* 7.9*   GFR: Estimated Creatinine Clearance: 57.3 mL/min (by C-G formula based on SCr of 0.53 mg/dL). Liver Function Tests: Recent Labs  Lab 12/30/17 0837  AST 23  ALT 18  ALKPHOS 128*  BILITOT 0.5  PROT 6.2*  ALBUMIN 2.5*   No results for input(s): LIPASE, AMYLASE in the last 168 hours. No  results for input(s): AMMONIA in the last 168 hours. Coagulation Profile: No results for input(s): INR, PROTIME in the last 168 hours. Cardiac Enzymes: No results for input(s): CKTOTAL, CKMB, CKMBINDEX, TROPONINI in the last 168 hours. BNP (last 3 results) No results for input(s): PROBNP in the last 8760 hours. HbA1C: No results for input(s): HGBA1C in the last 72 hours. CBG: No results for input(s): GLUCAP in the last 168  hours. Lipid Profile: No results for input(s): CHOL, HDL, LDLCALC, TRIG, CHOLHDL, LDLDIRECT in the last 72 hours. Thyroid Function Tests: No results for input(s): TSH, T4TOTAL, FREET4, T3FREE, THYROIDAB in the last 72 hours. Anemia Panel: No results for input(s): VITAMINB12, FOLATE, FERRITIN, TIBC, IRON, RETICCTPCT in the last 72 hours. Sepsis Labs: Recent Labs  Lab 12/30/17 0844  LATICACIDVEN 1.26    Recent Results (from the past 240 hour(s))  Wound or Superficial Culture     Status: None (Preliminary result)   Collection Time: 12/30/17 10:45 AM  Result Value Ref Range Status   Specimen Description ABSCESS  Final   Special Requests NONE  Final   Gram Stain   Final    MODERATE WBC PRESENT, PREDOMINANTLY PMN ABUNDANT GRAM NEGATIVE RODS ABUNDANT GRAM POSITIVE COCCI IN PAIRS IN CLUSTERS FEW GRAM POSITIVE RODS    Culture CULTURE REINCUBATED FOR BETTER GROWTH  Final   Report Status PENDING  Incomplete  Surgical pcr screen     Status: None   Collection Time: 01/01/18 12:26 AM  Result Value Ref Range Status   MRSA, PCR NEGATIVE NEGATIVE Final   Staphylococcus aureus NEGATIVE NEGATIVE Final    Comment: (NOTE) The Xpert SA Assay (FDA approved for NASAL specimens in patients 33 years of age and older), is one component of a comprehensive surveillance program. It is not intended to diagnose infection nor to guide or monitor treatment.   Aerobic/Anaerobic Culture (surgical/deep wound)     Status: None (Preliminary result)   Collection Time: 01/01/18  7:51 AM  Result Value Ref Range Status   Specimen Description WOUND ABDOMEN  Final   Special Requests WOUND DEBRIDEMENT SPEC A  Final   Gram Stain   Final    MODERATE WBC PRESENT,BOTH PMN AND MONONUCLEAR ABUNDANT GRAM POSITIVE COCCI IN PAIRS ABUNDANT GRAM NEGATIVE RODS FEW GRAM POSITIVE RODS Results Called to: S. DIXON AT 4854 ON 01/01/18 BY C. JESSUP, MLT.    Culture PENDING  Incomplete   Report Status PENDING  Incomplete   Aerobic/Anaerobic Culture (surgical/deep wound)     Status: None (Preliminary result)   Collection Time: 01/01/18  7:56 AM  Result Value Ref Range Status   Specimen Description BONE  Final   Special Requests ABDOMINAL WALL DEBRIDEMENT BONE SPEC B  Final   Gram Stain   Final    FEW WBC PRESENT, PREDOMINANTLY PMN NO ORGANISMS SEEN Results Called to: S. DIXON AT 6270 01/01/18 BY C. JEESUP, MLT.    Culture PENDING  Incomplete   Report Status PENDING  Incomplete         Radiology Studies: Dg Pelvis Portable  Result Date: 01/01/2018 CLINICAL DATA:  Acute osteomyelitis of pelvic region EXAM: PORTABLE PELVIS 1-2 VIEWS COMPARISON:  CT and plain film 12/30/2017 FINDINGS: There appears to be mild widening of the pubic symphysis with compared to prior plain film. Also, the underlying cortex of the pubic symphysis appears indistinct. Findings likely related to septic arthritis of the pubic symphysis with early changes of adjacent osteomyelitis. IMPRESSION: Slight interval widening of the pubic symphysis with loss of  underlying cortex, likely related to septic arthritis and early osteomyelitis. This could be further evaluated with MRI if felt clinically indicated. Electronically Signed   By: Rolm Baptise M.D.   On: 01/01/2018 11:21        Scheduled Meds: . docusate sodium  100 mg Oral BID  . enoxaparin (LOVENOX) injection  40 mg Subcutaneous Q24H  . [START ON 01/02/2018] feeding supplement (ENSURE ENLIVE)  237 mL Oral BID BM  . HYDROmorphone      . mouth rinse  15 mL Mouth Rinse BID  . metroNIDAZOLE  500 mg Oral Q8H  . [START ON 01/02/2018] multivitamin  1 tablet Oral Daily  . multivitamin with minerals  1 tablet Oral Daily  . sodium chloride flush  10-40 mL Intracatheter Q12H  . vitamin C  500 mg Oral Daily   Continuous Infusions: . ceFEPime (MAXIPIME) IV    . lactated ringers 100 mL/hr at 01/01/18 0434  . vancomycin       LOS: 2 days     Berle Mull, MD [facility] 01/01/2018, 5:43 PM  If 7PM-7AM, please contact night-coverage [email or web address] Password [email or web address] 01/01/2018, 5:43 PM

## 2018-01-01 NOTE — Progress Notes (Signed)
Patient ID: Yolanda Watson, female   DOB: March 13, 1951, 67 y.o.   MRN: [id number]   LOS: 2 days   Subjective: In recovery   Objective: Vital signs in last 24 hours: Temp:  [97.8 F (36.6 C)-98.4 F (36.9 C)] 98.4 F (36.9 C) (01/03 0913) Pulse Rate:  [91-98] 94 (01/03 0913) Resp:  [10-20] 14 (01/03 0913) BP: (103-123)/(56-77) 119/71 (01/03 0913) SpO2:  [93 %-100 %] 93 % (01/03 0913) Last BM Date: 12/31/17   Laboratory  CBC Recent Labs    12/31/17 0517 01/01/18 0440  WBC 9.3 9.9  HGB 8.6* 8.8*  HCT 27.4* 28.7*  PLT 330 367   BMET Recent Labs    12/31/17 0517 01/01/18 0440  NA 134* 136  K 3.4* 3.4*  CL 101 103  CO2 27 28  GLUCOSE 105* 96  BUN 6 <5*  CREATININE 0.48 0.53  CALCIUM 7.8* 7.9*    Assessment/Plan: Pubic symphysis osteo -- Plastics debrided necrotic bone this morning leaving ~2cm defect. Will get x-ray for baseline comparison but can be WBAT BLE.    Lisette Abu, PA-C Orthopedic Surgery [phone] 01/01/2018

## 2018-01-01 NOTE — Interval H&P Note (Signed)
History and Physical Interval Note:  01/01/2018 7:08 AM  Yolanda Watson  has presented today for surgery, with the diagnosis of open abdominal wound, osteomyelitis, cancer of right colon  The various methods of treatment have been discussed with the patient and family. After consideration of risks, benefits and other options for treatment, the patient has consented to  Debridement abdominal wound as a surgical intervention .  The patient's history has been reviewed, patient examined, no change in status, stable for surgery.  I have reviewed the patient's chart and labs.  Questions were answered to the patient's satisfaction.     Lovey Crupi

## 2018-01-01 NOTE — Anesthesia Postprocedure Evaluation (Signed)
Anesthesia Post Note  Patient: Yolanda Watson  Procedure(s) Performed: DEBRIDEMENT AND CLOSURE ABDOMINAL WOUND (N/A Abdomen)     Patient location during evaluation: PACU Anesthesia Type: General Level of consciousness: awake and alert Pain management: pain level controlled Vital Signs Assessment: post-procedure vital signs reviewed and stable Respiratory status: spontaneous breathing, nonlabored ventilation and respiratory function stable Cardiovascular status: blood pressure returned to baseline and stable Postop Assessment: no apparent nausea or vomiting Anesthetic complications: no    Last Vitals:  Vitals:   01/01/18 0900 01/01/18 0913  BP: 121/60 119/71  Pulse: 96 94  Resp: 14 14  Temp: 36.6 C 36.9 C  SpO2: 98% 93%    Last Pain:  Vitals:   01/01/18 0930  TempSrc:   PainSc: [place]

## 2018-01-01 NOTE — Anesthesia Preprocedure Evaluation (Signed)
Anesthesia Evaluation  Patient identified by MRN, date of birth, ID band Patient awake    Reviewed: Allergy & Precautions, NPO status , Patient's Chart, lab work & pertinent test results  Airway Mallampati: II  TM Distance: >3 FB Neck ROM: Full    Dental  (+) Teeth Intact, Dental Advisory Given   Pulmonary Current Smoker,    breath sounds clear to auscultation       Cardiovascular  Rhythm:Regular Rate:Normal     Neuro/Psych    GI/Hepatic   Endo/Other    Renal/GU      Musculoskeletal   Abdominal   Peds  Hematology   Anesthesia Other Findings Recurrent colon cancer Infection abdominal wall  Reproductive/Obstetrics                             Anesthesia Physical  Anesthesia Plan  ASA: III  Anesthesia Plan: General   Post-op Pain Management:    Induction: Intravenous  PONV Risk Score and Plan: 2 and Ondansetron and Midazolam  Airway Management Planned: Oral ETT  Additional Equipment:   Intra-op Plan:   Post-operative Plan: Extubation in OR  Informed Consent: I have reviewed the patients History and Physical, chart, labs and discussed the procedure including the risks, benefits and alternatives for the proposed anesthesia with the patient or authorized representative who has indicated his/her understanding and acceptance.   Dental advisory given  Plan Discussed with: CRNA and Anesthesiologist  Anesthesia Plan Comments:         Anesthesia Quick Evaluation

## 2018-01-01 NOTE — Consult Note (Signed)
Consultation Note Date: 01/01/2018   Patient Name: Yolanda Watson  DOB: April 28, 1951  MRN: [id number]  Age / Sex: 67 y.o., female  PCP: Patient, No Pcp Per Referring Physician: Lavina Hamman, MD  Reason for Consultation: Establishing goals of care and Pain control  HPI/Patient Profile: 67 y.o. female  with past medical history of colon cancer with metastases to abdominal wall status post recent resection and reconstruction with subsequent skin flap necrosis admitted on 12/30/2017 with hip pain and drainage from abdominal wound.  There is concern that she has involvement of bone.  Palliative care consulted for goals of care.  Clinical Assessment and Goals of Care: I met today with Yolanda Watson.  She reports the most important things to her are her family and continuing to work to feel better.  In the past she has enjoyed working with horses and travel, but these have both been very limited due to her being weak during treatment for her cancer.  She reports that the physicians have been doing a good job explaining things to her, and she denies having any concerns at this time.  We did discuss her clinical course over the past several months as well as discussion regarding advance care planning.  She reports that her husband would be her surrogate decision maker if she cannot make her own decisions.  We also discussed her care plan this hospitalization including plan for surgical debridement/washout with potential bone biopsy scheduled for tomorrow morning.  She asked me to explain this to her husband, and I spoke with him on the phone and relayed that plan of care was per her surgeon's recommendations and included plan for OR tomorrow.  He reported understanding this.  We also discussed her pain regimen this admission.  She reports that her pain is currently well controlled on regimen of intermittent IV Dilaudid.  Would  like to keep with her current pain regimen for now.  Overall, Yolanda Watson is invested in a plan to continue with current therapies and await findings from surgical intervention and possible biopsy prior to making any further decisions.  We will continue to follow along.  SUMMARY OF RECOMMENDATIONS   -DNR/DNI -Her husband is her surrogate decision maker if she cannot make her own decisions -Plan for surgery as outlined by Dr. Barry Dienes and [name].  We will continue to follow along and are happy to assist in any conversations regarding long-term goals of care based upon her clinical course of the next several days. -Pain: Currently well controlled on intermittent IV Dilaudid.  I did discuss with her options for pain management if she is not controlled with current regimen.  I think she would be a good candidate for a PCA if her pain is not controlled with current regimen. -Let me know if there are particular areas with which we can be of assistance in the care of Yolanda Watson moving forward.  Code Status/Advance Care Planning:  DNR   Symptom Management:   As above  Palliative Prophylaxis:  Frequent Pain Assessment  Psycho-social/Spiritual:   Desire for further Chaplaincy support: Did not address today  Additional recommendations: Caregiving  Support/Resources  Prognosis:   Unable to determine  Discharge Planning: To Be Determined      Primary Diagnoses: Present on Admission: . Osteomyelitis ([place]) . Open wound of abdomen . Cancer of right colon ([place])   I have reviewed the medical record, interviewed the patient and family, and examined the patient. The following aspects are pertinent.  Past Medical History:  Diagnosis Date  . Acquired bilateral hand deformity    both hads with severe digit contnracture. VERY limited ROM   . Alcohol abuse   . Colon cancer ([place]) 2016   metastatic to abdominal wall  . Port-A-Cath in place since 04-2017   right chest    Social  History   Socioeconomic History  . Marital status: Married    Spouse name: None  . Number of children: None  . Years of education: None  . Highest education level: None  Social Needs  . Financial resource strain: None  . Food insecurity - worry: None  . Food insecurity - inability: None  . Transportation needs - medical: None  . Transportation needs - non-medical: None  Occupational History  . Occupation: retired  Tobacco Use  . Smoking status: Current Every Day Smoker    Packs/day: 1.00    Years: 45.00    Pack years: 45.00    Types: Cigarettes  . Smokeless tobacco: Never Used  Substance and Sexual Activity  . Alcohol use: Yes    Alcohol/week: 1.2 - 2.4 oz    Types: 2 - 4 Shots of liquor per week    Comment: denies problem with ETOH but contineus to drink "a couple of drinks per day"  . Drug use: No  . Sexual activity: None  Other Topics Concern  . None  Social History Narrative  . None   Family History  Problem Relation Age of Onset  . Cancer Neg Hx    Scheduled Meds: . docusate sodium  100 mg Oral BID  . enoxaparin (LOVENOX) injection  40 mg Subcutaneous Q24H  . HYDROmorphone      . mouth rinse  15 mL Mouth Rinse BID  . sodium chloride flush  10-40 mL Intracatheter Q12H   Continuous Infusions: . lactated ringers 100 mL/hr at 01/01/18 0434   PRN Meds:.acetaminophen **OR** acetaminophen, HYDROmorphone (DILAUDID) injection, LORazepam, ondansetron **OR** ondansetron (ZOFRAN) IV, sodium chloride flush Medications Prior to Admission:  Prior to Admission medications   Medication Sig Start Date End Date Taking? Authorizing Provider  naproxen sodium (ANAPROX) 220 MG tablet Take 220-440 mg 2 (two) times daily as needed by mouth (for pain).    Yes [provider]  oxyCODONE (OXY IR/ROXICODONE) 5 MG immediate release tablet Take 1-2 tablets (5-10 mg total) every 4 (four) hours as needed by mouth for moderate pain, severe pain or breakthrough pain. Patient not  taking: Reported on 12/30/2017 11/18/17   [name], MD   Allergies  Allergen Reactions  . Penicillins Other (See Comments)    "PASSED OUT AS A CHILD" > ? SYNCOPE ? PATIENT HAS HAD A PCN REACTION WITH IMMEDIATE RASH, FACIAL/TONGUE/THROAT SWELLING, SOB, OR LIGHTHEADEDNESS WITH HYPOTENSION:  #  #  #  YES  #  #  #   Has patient had a PCN reaction causing severe rash involving mucus membranes or skin necrosis: No Has patient had a PCN reaction that required hospitalization: No Has patient had a  PCN reaction occurring within the last 10 years: No   . Latex Itching and Rash    BED "CHUX PADS"   Review of Systems  Constitutional: Positive for activity change and fatigue.  Musculoskeletal: Positive for back pain and myalgias.  Psychiatric/Behavioral: Positive for sleep disturbance.    Physical Exam  General: Alert, awake, in no acute distress.  HEENT: No bruits, no goiter, no JVD Heart: Regular rate and rhythm. No murmur appreciated. Lungs: Good air movement, clear Abdomen: Soft, nontender, nondistended, positive bowel sounds.  Wound not examined Ext: No significant edema Skin: Warm and dry Neuro: Grossly intact, nonfocal.  Vital Signs: BP 119/71 (BP Location: Left Arm)   Pulse 94   Temp 98.4 F (36.9 C) (Oral)   Resp 14   Ht _0  (1.676 m)   Wt 51.8 kg (114 lb 3.2 oz)   SpO2 93%   BMI 18.43 kg/m  Pain Assessment: 0-10   Pain Score: 9    SpO2: SpO2: 93 % O2 Device:SpO2: 93 % O2 Flow Rate: .   IO: Intake/output summary:   Intake/Output Summary (Last 24 hours) at 01/01/2018 1045 Last data filed at 01/01/2018 0601 Gross per 24 hour  Intake 2691.67 ml  Output 1310 ml  Net 1381.67 ml    LBM: Last BM Date: 12/31/17 Baseline Weight: Weight: 49.9 kg (110 lb) Most recent weight: Weight: 51.8 kg (114 lb 3.2 oz)     Palliative Assessment/Data:   Flowsheet Rows     Most Recent Value  Intake Tab  Referral Department  Hospitalist  Unit at Time of Referral  Med/Surg  Unit  Palliative Care Primary Diagnosis  Cancer  Date Notified  12/30/17  Palliative Care Type  New Palliative care  Reason for referral  Clarify Goals of Care, Pain  Date of Admission  12/30/17  Date first seen by Palliative Care  12/31/17  # of days Palliative referral response time  1 Day(s)  # of days IP prior to Palliative referral  0  Clinical Assessment  Palliative Performance Scale Score  60%  Pain Max last 24 hours  10  Pain Min Last 24 hours  4  Psychosocial & Spiritual Assessment  Palliative Care Outcomes  Patient/Family meeting held?  Yes  Who was at the meeting?  patient      Time In: 1855 Time Out: 1910 Time Total: 75 Greater than 50%  of this time was spent counseling and coordinating care related to the above assessment and plan.  Signed by: Micheline Rough, MD   Please contact Palliative Medicine Team phone at [phone] for questions and concerns.  For individual provider: See Shea Evans

## 2018-01-01 NOTE — Op Note (Signed)
Operative Note   DATE OF OPERATION: 1.3.19  LOCATION: [facility]-inpatient  SURGICAL DIVISION: Plastic Surgery  PREOPERATIVE DIAGNOSES:  1. Open wound abdomen with complication 2. Osteomyelitis pubic bone 3. History metastatic colon cancer  POSTOPERATIVE DIAGNOSES:  same  PROCEDURE:  Excisional debridement skin subcutaneous tissue bone 20 cm2  SURGEON: [name] MD MBA, Stark Klein MD  ASSISTANT: none  ANESTHESIA:  General.   EBL: 10 ml  COMPLICATIONS: None immediate.   INDICATIONS FOR PROCEDURE:  The patient, Yolanda Watson, is a 67 y.o. female born on 24-Aug-1951, is here for debridement abdominal wound. Patient with history metastatic colon cancer to abdominal wall, post resection. Course complicated by skin flap necrosis, open wound and now osteomyelitis pelvis. She has a history radiation to area.   FINDINGS: Grossly necrotic bone at pubic symphysis full thickness, following debridement nearly 2 cm defect present at pubic symphysis. Following debridement open wound 11 x 7 cm with undermining from 4 to 7 o clock to 4 cm.  DESCRIPTION OF PROCEDURE:  The patient's operative site was marked with the patient in the preoperative area. The patient was taken to the operating room. SCDs were placed. IV antibiotics were held per ID recommendations in anticipation cultures. The patient's operative site was prepped and draped in a sterile fashion. A time out was performed and all information was confirmed to be correct. At caudal portion wound undermining skin flaps noted to pubic bone. Symphysis noted to be grossly necrotic full thickness. Ronguer and scissors used to excise necrotic skin subcutaneous tissue and bone. Bone sent for pathology and culture. Exam of right groin with undermining skin flaps in this area but no obvious mass and granulation tissue present. Cavity irrigated thoroughly. The bone present at wound edges was noted to be soft but with bleeding present. A 2 cm gap at  symphysis present post debridement. Black foam used and VAC applied, set to 125 mm Hg.   The patient was allowed to wake from anesthesia, extubated and taken to the recovery room in satisfactory condition.   SPECIMENS:  debriement  DRAINS: Viera East, MD [facility] [phone], pin [phone]

## 2018-01-01 NOTE — Progress Notes (Addendum)
Pharmacy Antibiotic Note  Yolanda Watson is a 67 y.o. female admitted on 12/30/2017 with abdominal wound/pelvic osteomyelitis.  Pharmacy has been consulted for vancomycin dosing. Patient is s/p debridement of necrotic bone this AM. Antibiotics were held prior to surgery with last dose of Vancomycin 12/31/17 at 0200. Patient is afebrile with a normal WBC count. Renal function is stable with SCr< 1. Patient is low weight so creatinine clearance can potentially be unreliable.   Plan: Vancomycin 1000 mg every 24 hours Also Cefepime 2 gm every 8 hours Monitor cultures, clinical improvement  Height: 5\' 6"  (167.6 cm) Weight: 114 lb 3.2 oz (51.8 kg) IBW/kg (Calculated) : 59.3  Temp (24hrs), Avg:98.2 F (36.8 C), Min:97.8 F (36.6 C), Max:98.4 F (36.9 C)  Recent Labs  Lab 12/30/17 0837 12/30/17 0844 12/31/17 0517 01/01/18 0440  WBC 12.8*  --  9.3 9.9  CREATININE 0.55  --  0.48 0.53  LATICACIDVEN  --  1.26  --   --     Estimated Creatinine Clearance: 57.3 mL/min (by C-G formula based on SCr of 0.53 mg/dL).    Allergies  Allergen Reactions  . Penicillins Other (See Comments)    Tolerated cefepime 01/01/2018 "PASSED OUT AS A CHILD" > ? SYNCOPE ? PATIENT HAS HAD A PCN REACTION WITH IMMEDIATE RASH, FACIAL/TONGUE/THROAT SWELLING, SOB, OR LIGHTHEADEDNESS WITH HYPOTENSION:  #  #  #  YES  #  #  #   Has patient had a PCN reaction causing severe rash involving mucus membranes or skin necrosis: No Has patient had a PCN reaction that required hospitalization: No Has patient had a PCN reaction occurring within the last 10 years: No   . Latex Itching and Rash    BED "CHUX PADS"    Antimicrobials this admission: 1/1 Vanc>>1/2, 1/3>> 1/1 Cefepime >>1/2, 1/3>> 1/1>>1/2  Metronidazole >>, 1/3>>   Microbiology results: 1/1 Abdominal wound swab: Reincubating 1/3 wound: gm positive and negative rods, gm positive cocci in pairs  1/3 pelvic Bone: No growth   Thank you for allowing pharmacy to be a  part of this patient's care.  Susa Raring, PharmD, BCPS PGY2 Infectious Diseases Pharmacy Resident Pager: [phone]  01/01/2018 5:07 PM

## 2018-01-01 NOTE — Progress Notes (Signed)
Palliative care progress note  Reason for consult: Goals of care/symptom management  I met this evening with Yolanda Watson.  She reports having some pain at surgical site, but she states she is feeling better overall.  Last dose of pain medication was around 1 hour ago.  Reports that when she is using Dilaudid and seems to last about an hour and a half.  We discussed options for pain management including continuation of current regimen, trial of oral medications to see if they may be longer lasting, or initiation of PCA pump until we determine her overall needs.  She reports she would like to continue with current regimen at this time.  We also discussed again about waiting for results of biopsy prior to making further decisions regarding long-term goals of care.  Palliative care to continue to follow  Total time: 20 minutes Greater than 50%  of this time was spent counseling and coordinating care related to the above assessment and plan.  Gene Freeman, MD [facility]iative [facility] Team [phone]  

## 2018-01-01 NOTE — Transfer of Care (Signed)
Immediate Anesthesia Transfer of Care Note  Patient: Yolanda Watson  Procedure(s) Performed: DEBRIDEMENT AND CLOSURE ABDOMINAL WOUND (N/A Abdomen)  Patient Location: PACU  Anesthesia Type:General  Level of Consciousness: awake, alert , oriented and patient cooperative  Airway & Oxygen Therapy: Patient Spontanous Breathing  Post-op Assessment: Report given to RN, Post -op Vital signs reviewed and stable and Patient moving all extremities X 4  Post vital signs: Reviewed and stable  Last Vitals:  Vitals:   01/01/18 0530 01/01/18 0830  BP: 120/69   Pulse: 92 96  Resp: 18 20  Temp: 36.8 C   SpO2: 100% 95%    Last Pain:  Vitals:   01/01/18 0559  TempSrc:   PainSc: Asleep      Patients Stated Pain Goal: 2 (54/09/81 1914)  Complications: No apparent anesthesia complications

## 2018-01-01 NOTE — Anesthesia Procedure Notes (Signed)
Procedure Name: LMA Insertion Date/Time: 01/01/2018 7:35 AM Performed by: Julieta Bellini, CRNA Pre-anesthesia Checklist: Patient identified, Emergency Drugs available, Suction available and Patient being monitored Patient Re-evaluated:Patient Re-evaluated prior to induction Oxygen Delivery Method: Circle system utilized Preoxygenation: Pre-oxygenation with 100% oxygen Induction Type: IV induction Ventilation: Mask ventilation without difficulty LMA: LMA inserted LMA Size: 4.0 Number of attempts: 1 Placement Confirmation: positive ETCO2 and breath sounds checked- equal and bilateral Tube secured with: Tape Dental Injury: Teeth and Oropharynx as per pre-operative assessment

## 2018-01-02 ENCOUNTER — Encounter (HOSPITAL_COMMUNITY): Payer: Self-pay | Admitting: Plastic Surgery

## 2018-01-02 DIAGNOSIS — T402X5A Adverse effect of other opioids, initial encounter: Secondary | ICD-10-CM

## 2018-01-02 DIAGNOSIS — K5903 Drug induced constipation: Secondary | ICD-10-CM

## 2018-01-02 DIAGNOSIS — G893 Neoplasm related pain (acute) (chronic): Secondary | ICD-10-CM

## 2018-01-02 LAB — BASIC METABOLIC PANEL
ANION GAP: 5 (ref 5–15)
BUN: 8 mg/dL (ref 6–20)
CALCIUM: 7.8 mg/dL — AB (ref 8.9–10.3)
CO2: 30 mmol/L (ref 22–32)
Chloride: 102 mmol/L (ref 101–111)
Creatinine, Ser: 0.53 mg/dL (ref 0.44–1.00)
GFR calc Af Amer: >60 mL/min (ref >60)
GLUCOSE: 118 mg/dL — AB (ref 65–99)
Potassium: 3.8 mmol/L (ref 3.5–5.1)
SODIUM: 137 mmol/L (ref 135–145)

## 2018-01-02 LAB — CBC WITH DIFFERENTIAL/PLATELET
BASOS ABS: 0 10*3/uL (ref 0.0–0.1)
BASOS PCT: 0 %
EOS ABS: 0.2 10*3/uL (ref 0.0–0.7)
EOS PCT: 2 %
HCT: 27.4 % — ABNORMAL LOW (ref 36.0–46.0)
Hemoglobin: 8.2 g/dL — ABNORMAL LOW (ref 12.0–15.0)
LYMPHS PCT: 19 %
Lymphs Abs: 1.9 10*3/uL (ref 0.7–4.0)
MCH: 26.1 pg (ref 26.0–34.0)
MCHC: 29.9 g/dL — ABNORMAL LOW (ref 30.0–36.0)
MCV: 87.3 fL (ref 78.0–100.0)
MONO ABS: 0.9 10*3/uL (ref 0.1–1.0)
Monocytes Relative: 9 %
Neutro Abs: 7.1 10*3/uL (ref 1.7–7.7)
Neutrophils Relative %: 70 %
PLATELETS: 386 10*3/uL (ref 150–400)
RBC: 3.14 MIL/uL — AB (ref 3.87–5.11)
RDW: 16 % — AB (ref 11.5–15.5)
WBC: 10.1 10*3/uL (ref 4.0–10.5)

## 2018-01-02 LAB — MAGNESIUM: MAGNESIUM: 1.6 mg/dL — AB (ref 1.7–2.4)

## 2018-01-02 MED ORDER — HYDROMORPHONE HCL 1 MG/ML IJ SOLN: 1.0000 mg | Freq: Every day | INTRAMUSCULAR | Status: DC | PRN

## 2018-01-02 MED ORDER — OXYCODONE HCL ER 15 MG PO T12A
30.0000 mg | EXTENDED_RELEASE_TABLET | Freq: Two times a day (BID) | ORAL | Status: DC
  Administered 2018-01-02 – 2018-01-06 (×9): 30 mg via ORAL
  Filled 2018-01-02 (×8): qty 2

## 2018-01-02 MED ORDER — SENNOSIDES-DOCUSATE SODIUM 8.6-50 MG PO TABS
2.0000 | ORAL_TABLET | Freq: Two times a day (BID) | ORAL | Status: DC
  Administered 2018-01-02 – 2018-01-03 (×3): 2 via ORAL
  Filled 2018-01-02 (×8): qty 2

## 2018-01-02 MED ORDER — OXYCODONE HCL 5 MG PO TABS
5.0000 mg | ORAL_TABLET | ORAL | Status: DC | PRN
  Administered 2018-01-02: 5 mg via ORAL
  Filled 2018-01-02: qty 1

## 2018-01-02 MED ORDER — MAGNESIUM SULFATE 2 GM/50ML IV SOLN
2.0000 g | Freq: Once | INTRAVENOUS | Status: AC
  Administered 2018-01-02: 2 g via INTRAVENOUS
  Filled 2018-01-02: qty 50

## 2018-01-02 MED ORDER — METHOCARBAMOL 500 MG PO TABS
500.0000 mg | ORAL_TABLET | Freq: Three times a day (TID) | ORAL | Status: DC
  Administered 2018-01-02 – 2018-01-06 (×14): 500 mg via ORAL
  Filled 2018-01-02 (×14): qty 1

## 2018-01-02 MED ORDER — POLYETHYLENE GLYCOL 3350 17 G PO PACK: 17.0000 g | PACK | Freq: Every day | ORAL | Status: DC | PRN

## 2018-01-02 NOTE — Progress Notes (Addendum)
[facility] for Infectious Disease  Date of Admission:  12/30/2017     Total days of antibiotics 1          Patient ID: Yolanda Watson is a 67 y.o. female with Principal Problem:   Acute osteomyelitis of pelvic region [facility]) Active Problems:   Open wound of abdomen   Abdominal wall abscess at site of surgical wound   Cancer of right colon ([place])   . docusate sodium  100 mg Oral BID  . enoxaparin (LOVENOX) injection  40 mg Subcutaneous Q24H  . feeding supplement (ENSURE ENLIVE)  237 mL Oral BID BM  . mouth rinse  15 mL Mouth Rinse BID  . methocarbamol  500 mg Oral TID  . metroNIDAZOLE  500 mg Oral Q8H  . multivitamin  1 tablet Oral Daily  . multivitamin with minerals  1 tablet Oral Daily  . sodium chloride flush  10-40 mL Intracatheter Q12H  . vitamin C  500 mg Oral Daily    SUBJECTIVE: Yolanda Watson is feeling well today but requesting increased pain medication dosage so she can participate in physical therapy as she is concerned about 'losing muscle strength' by just lying around in bed. After she was unable to participate with PT today she actually tried again not long before I arrived and moved around more than she thought she could. Wound vac is containing drainage and working well. Tolerating all antibiotics well without side effects.   Review of Systems: Review of Systems  Constitutional: Negative for chills and fever.  HENT: Negative for tinnitus.   Eyes: Negative for blurred vision and photophobia.  Respiratory: Negative for cough and sputum production.   Cardiovascular: Negative for chest pain.  Gastrointestinal: Negative for diarrhea, nausea and vomiting.  Genitourinary: Negative for dysuria.  Musculoskeletal: Positive for joint pain (hips/thigh).  Skin: Negative for rash.  Neurological: Negative for headaches.    Allergies  Allergen Reactions  . Penicillins Other (See Comments)    Tolerated cefepime 01/01/2018 "PASSED OUT AS A CHILD" > ? SYNCOPE ? PATIENT  HAS HAD A PCN REACTION WITH IMMEDIATE RASH, FACIAL/TONGUE/THROAT SWELLING, SOB, OR LIGHTHEADEDNESS WITH HYPOTENSION:  #  #  #  YES  #  #  #   Has patient had a PCN reaction causing severe rash involving mucus membranes or skin necrosis: No Has patient had a PCN reaction that required hospitalization: No Has patient had a PCN reaction occurring within the last 10 years: No   . Latex Itching and Rash    BED "CHUX PADS"    OBJECTIVE: Vitals:   01/01/18 1430 01/01/18 1806 01/01/18 2139 01/02/18 0517  BP: 120/62 118/70 (!) 112/59 109/72  Pulse: 92 94 80 77  Resp: 15 14 16 16   Temp: 98.2 F (36.8 C) 98.2 F (36.8 C) 97.6 F (36.4 C) 97.8 F (36.6 C)  TempSrc: Oral Oral Oral Oral  SpO2: 98% 97% 98% 98%  Weight:      Height:       Body mass index is 18.43 kg/m.  Physical Exam  Constitutional: She is oriented to person, place, and time and well-developed, well-nourished, and in no distress.  Pleasant and in good spirits lying in bed watching television.   HENT:  Mouth/Throat: No oral lesions. No dental abscesses.  Eyes: Pupils are equal, round, and reactive to light. No scleral icterus.  Cardiovascular: Normal rate, regular rhythm and normal heart sounds.  Pulmonary/Chest: Effort normal and breath sounds normal.  Abdominal: Soft. She exhibits no distension. There is no tenderness.  Wound vac intact to LLQ with adequate suction. Thin brown/red drainage noted in canister. Surrounding tissues without erythema or swelling.   Musculoskeletal: Normal range of motion. She exhibits no tenderness.  Lymphadenopathy:    She has no cervical adenopathy.  Neurological: She is alert and oriented to person, place, and time.  Skin: Skin is warm and dry. No rash noted.  Psychiatric: Mood, affect and judgment normal.  Vitals reviewed.   Lab Results Lab Results  Component Value Date   WBC 10.1 01/02/2018   HGB 8.2 (L) 01/02/2018   HCT 27.4 (L) 01/02/2018   MCV 87.3 01/02/2018   PLT 386  01/02/2018    Lab Results  Component Value Date   CREATININE 0.53 01/02/2018   BUN 8 01/02/2018   NA 137 01/02/2018   K 3.8 01/02/2018   CL 102 01/02/2018   CO2 30 01/02/2018    Lab Results  Component Value Date   ALT 18 12/30/2017   AST 23 12/30/2017   ALKPHOS 128 (H) 12/30/2017   BILITOT 0.5 12/30/2017     Microbiology: Recent Results (from the past 240 hour(s))  Wound or Superficial Culture     Status: None (Preliminary result)   Collection Time: 12/30/17 10:45 AM  Result Value Ref Range Status   Specimen Description ABSCESS  Final   Special Requests NONE  Final   Gram Stain   Final    MODERATE WBC PRESENT, PREDOMINANTLY PMN ABUNDANT GRAM NEGATIVE RODS ABUNDANT GRAM POSITIVE COCCI IN PAIRS IN CLUSTERS FEW GRAM POSITIVE RODS    Culture CULTURE REINCUBATED FOR BETTER GROWTH  Final   Report Status PENDING  Incomplete  Surgical pcr screen     Status: None   Collection Time: 01/01/18 12:26 AM  Result Value Ref Range Status   MRSA, PCR NEGATIVE NEGATIVE Final   Staphylococcus aureus NEGATIVE NEGATIVE Final    Comment: (NOTE) The Xpert SA Assay (FDA approved for NASAL specimens in patients 39 years of age and older), is one component of a comprehensive surveillance program. It is not intended to diagnose infection nor to guide or monitor treatment.   Aerobic/Anaerobic Culture (surgical/deep wound)     Status: None (Preliminary result)   Collection Time: 01/01/18  7:51 AM  Result Value Ref Range Status   Specimen Description WOUND ABDOMEN  Final   Special Requests WOUND DEBRIDEMENT SPEC A  Final   Gram Stain   Final    MODERATE WBC PRESENT,BOTH PMN AND MONONUCLEAR ABUNDANT GRAM POSITIVE COCCI IN PAIRS ABUNDANT GRAM NEGATIVE RODS FEW GRAM POSITIVE RODS Results Called to: S. Tayvin Preslar AT 4403 ON 01/01/18 BY C. JESSUP, MLT.    Culture CULTURE REINCUBATED FOR BETTER GROWTH  Final   Report Status PENDING  Incomplete  Aerobic/Anaerobic Culture (surgical/deep wound)      Status: None (Preliminary result)   Collection Time: 01/01/18  7:56 AM  Result Value Ref Range Status   Specimen Description BONE  Final   Special Requests ABDOMINAL WALL DEBRIDEMENT BONE SPEC B  Final   Gram Stain   Final    FEW WBC PRESENT, PREDOMINANTLY PMN NO ORGANISMS SEEN Results Called to: S. Meriah Shands AT 4742 01/01/18 BY C. JEESUP, MLT.    Culture CULTURE REINCUBATED FOR BETTER GROWTH  Final   Report Status PENDING  Incomplete   ASSESSMENT:  Yolanda Watson is doing well after further abdominal/pelvic debridement surgery for her non-healing wound and pelvic osteomyelitis sequelae. Cultures from tissue and bone  specimens have been re-incubated for better growth. Pain control continues to be difficult for her considering her pain and desire to move to prevent deconditioning. Wound vac is working well for her and abdominal wall site looks clean.   PLAN:  1. No changes to her current antibiotic plan at this point. Will continue to follow cultures and adjust.  2. She will need OPAT arrangements as she gets closer to discharge - CV access in place via her port.  3. Seems she will be spending some time in a rehab facility/SNF based on PT recommendations which I agree with. She has baseline functional limitations of her bilateral hands and worry about injury at home with increased pain medications and deconditioning related to injury.   Dr. Linus Salmons will monitor cultures over the weekend and is available for any infectious disease questions at [phone].   Janene Madeira, MSN, NP-C [facility] for Infectious [place] Cell: [phone] Pager: [phone]  01/02/2018  12:29 PM

## 2018-01-02 NOTE — Progress Notes (Signed)
PROGRESS NOTE    Yolanda Watson  NFA:[id number] DOB: 10-03-1951 DOA: 12/30/2017 PCP: Patient, No Pcp Per   Brief Narrative: Yolanda Watson is a 67 y.o. female with medical history significant of metastatic colon cancer to the abdominal wall s/p recent resection and reconstruction presenting with wound drainage.    Assessment & Plan:   Principal Problem:   Acute osteomyelitis of pelvic region [facility]) Active Problems:   Cancer of right colon (HCC)   Open wound of abdomen   Abdominal wall abscess at site of surgical wound   Osteomyelitis/Septic arthritis of pubic symphysis Abdominal wound Patient follows with general and plastic surgery as an outpatient. No elevated WBC and afebrile. Stable. Antibiotics started on admission -Infectious disease recommendations -Continue antibiotics -Plastic surgery recommends debridement underwent that and tolerated it very well on 01/01/2018.  Follow-up on culture.  Currently growing group B strep. -Orthopedic surgery consult  History of metastatic colon cancer S/p FOLFOX x5. Pathology (10/03/17) positive for adenocarcinoma. -Plastic surgery recommendations -Palliative care medicine consult completed, DNR/DNI Pain is not medically controlled, palliative care taking using PCA over the weekend for pain control.   DVT prophylaxis: Lovenox Code Status: DNR Family Communication: None at bedside Disposition Plan: When medically stable   Consultants:   Plastic surgery  General surgery  Infectious disease  Procedures:   None  Antimicrobials:  Vancomycin (1/1 >>  Cefepime (1/1 >>  Metronidazole (1/1 >>    Subjective: No acute complaint, no nausea no vomiting.  Pain is not well controlled,  Objective: Vitals:   01/01/18 1806 01/01/18 2139 01/02/18 0517 01/02/18 1440  BP: 118/70 (!) 112/59 109/72 110/60  Pulse: 94 80 77 80  Resp: 14 16 16 16   Temp: 98.2 F (36.8 C) 97.6 F (36.4 C) 97.8 F (36.6 C) 97.9 F (36.6 C)  TempSrc:  Oral Oral Oral Oral  SpO2: 97% 98% 98% 98%  Weight:      Height:        Intake/Output Summary (Last 24 hours) at 01/02/2018 1655 Last data filed at 01/02/2018 1354 Gross per 24 hour  Intake 2679.99 ml  Output 1300 ml  Net 1379.99 ml   Filed Weights   12/30/17 0752 12/30/17 1719  Weight: 49.9 kg (110 lb) 51.8 kg (114 lb 3.2 oz)    Examination:  General exam: Appears calm and comfortable Respiratory system: Clear to auscultation. Respiratory effort normal. Cardiovascular system: S1 & S2 heard, RRR. No murmurs, rubs, gallops or clicks. Gastrointestinal system: Abdomen is nondistended, soft and nontender. No organomegaly or masses felt. Normal bowel sounds heard. Dressing is clean. Central nervous system: Alert and oriented. No focal neurological deficits. Extremities: No edema. No calf tenderness Skin: No cyanosis. No rashes Psychiatry: Judgement and insight appear normal. Mood & affect appropriate.     Data Reviewed: I have personally reviewed following labs and imaging studies  CBC: Recent Labs  Lab 12/30/17 0837 12/31/17 0517 01/01/18 0440 01/02/18 0352  WBC 12.8* 9.3 9.9 10.1  NEUTROABS 11.2*  --   --  7.1  HGB 10.3* 8.6* 8.8* 8.2*  HCT 33.4* 27.4* 28.7* 27.4*  MCV 88.6 89.0 88.3 87.3  PLT 363 330 367 469   Basic Metabolic Panel: Recent Labs  Lab 12/30/17 0837 12/31/17 0517 01/01/18 0440 01/02/18 0352  NA 135 134* 136 137  K 3.6 3.4* 3.4* 3.8  CL 96* 101 103 102  CO2 25 27 28 30   GLUCOSE 115* 105* 96 118*  BUN 14 6 <5* 8  CREATININE 0.55 0.48  0.53 0.53  CALCIUM 8.3* 7.8* 7.9* 7.8*  MG  --   --   --  1.6*   GFR: Estimated Creatinine Clearance: 57.3 mL/min (by C-G formula based on SCr of 0.53 mg/dL). Liver Function Tests: Recent Labs  Lab 12/30/17 0837  AST 23  ALT 18  ALKPHOS 128*  BILITOT 0.5  PROT 6.2*  ALBUMIN 2.5*   No results for input(s): LIPASE, AMYLASE in the last 168 hours. No results for input(s): AMMONIA in the last 168  hours. Coagulation Profile: No results for input(s): INR, PROTIME in the last 168 hours. Cardiac Enzymes: No results for input(s): CKTOTAL, CKMB, CKMBINDEX, TROPONINI in the last 168 hours. BNP (last 3 results) No results for input(s): PROBNP in the last 8760 hours. HbA1C: No results for input(s): HGBA1C in the last 72 hours. CBG: No results for input(s): GLUCAP in the last 168 hours. Lipid Profile: No results for input(s): CHOL, HDL, LDLCALC, TRIG, CHOLHDL, LDLDIRECT in the last 72 hours. Thyroid Function Tests: No results for input(s): TSH, T4TOTAL, FREET4, T3FREE, THYROIDAB in the last 72 hours. Anemia Panel: No results for input(s): VITAMINB12, FOLATE, FERRITIN, TIBC, IRON, RETICCTPCT in the last 72 hours. Sepsis Labs: Recent Labs  Lab 12/30/17 0844  LATICACIDVEN 1.26    Recent Results (from the past 240 hour(s))  Wound or Superficial Culture     Status: None (Preliminary result)   Collection Time: 12/30/17 10:45 AM  Result Value Ref Range Status   Specimen Description ABSCESS  Final   Special Requests NONE  Final   Gram Stain   Final    MODERATE WBC PRESENT, PREDOMINANTLY PMN ABUNDANT GRAM NEGATIVE RODS ABUNDANT GRAM POSITIVE COCCI IN PAIRS IN CLUSTERS FEW GRAM POSITIVE RODS    Culture CULTURE REINCUBATED FOR BETTER GROWTH  Final   Report Status PENDING  Incomplete  Surgical pcr screen     Status: None   Collection Time: 01/01/18 12:26 AM  Result Value Ref Range Status   MRSA, PCR NEGATIVE NEGATIVE Final   Staphylococcus aureus NEGATIVE NEGATIVE Final    Comment: (NOTE) The Xpert SA Assay (FDA approved for NASAL specimens in patients 39 years of age and older), is one component of a comprehensive surveillance program. It is not intended to diagnose infection nor to guide or monitor treatment.   Aerobic/Anaerobic Culture (surgical/deep wound)     Status: None (Preliminary result)   Collection Time: 01/01/18  7:51 AM  Result Value Ref Range Status   Specimen  Description WOUND ABDOMEN  Final   Special Requests WOUND DEBRIDEMENT SPEC A  Final   Gram Stain   Final    MODERATE WBC PRESENT,BOTH PMN AND MONONUCLEAR ABUNDANT GRAM POSITIVE COCCI IN PAIRS ABUNDANT GRAM NEGATIVE RODS FEW GRAM POSITIVE RODS Results Called to: S. DIXON AT 9563 ON 01/01/18 BY C. JESSUP, MLT.    Culture CULTURE REINCUBATED FOR BETTER GROWTH  Final   Report Status PENDING  Incomplete  Aerobic/Anaerobic Culture (surgical/deep wound)     Status: None (Preliminary result)   Collection Time: 01/01/18  7:56 AM  Result Value Ref Range Status   Specimen Description BONE  Final   Special Requests ABDOMINAL WALL DEBRIDEMENT BONE SPEC B  Final   Gram Stain   Final    FEW WBC PRESENT, PREDOMINANTLY PMN NO ORGANISMS SEEN Results Called to: S. DIXON AT 8756 01/01/18 BY C. JEESUP, MLT.    Culture   Final    RARE GROUP B STREP(S.AGALACTIAE)ISOLATED TESTING AGAINST S. AGALACTIAE NOT ROUTINELY PERFORMED DUE TO  PREDICTABILITY OF AMP/PEN/VAN SUSCEPTIBILITY. CULTURE REINCUBATED FOR BETTER GROWTH CRITICAL RESULT CALLED TO, READ BACK BY AND VERIFIED WITH: C BAKER,RN AT 1459 01/02/18 BY L BENFIELD CONCERNING GROWTH ON CULTURE    Report Status PENDING  Incomplete         Radiology Studies: Dg Pelvis Portable  Result Date: 01/01/2018 CLINICAL DATA:  Acute osteomyelitis of pelvic region EXAM: PORTABLE PELVIS 1-2 VIEWS COMPARISON:  CT and plain film 12/30/2017 FINDINGS: There appears to be mild widening of the pubic symphysis with compared to prior plain film. Also, the underlying cortex of the pubic symphysis appears indistinct. Findings likely related to septic arthritis of the pubic symphysis with early changes of adjacent osteomyelitis. IMPRESSION: Slight interval widening of the pubic symphysis with loss of underlying cortex, likely related to septic arthritis and early osteomyelitis. This could be further evaluated with MRI if felt clinically indicated. Electronically Signed   By: Rolm Baptise M.D.   On: 01/01/2018 11:21        Scheduled Meds: . docusate sodium  100 mg Oral BID  . enoxaparin (LOVENOX) injection  40 mg Subcutaneous Q24H  . feeding supplement (ENSURE ENLIVE)  237 mL Oral BID BM  . mouth rinse  15 mL Mouth Rinse BID  . methocarbamol  500 mg Oral TID  . metroNIDAZOLE  500 mg Oral Q8H  . multivitamin  1 tablet Oral Daily  . multivitamin with minerals  1 tablet Oral Daily  . sodium chloride flush  10-40 mL Intracatheter Q12H  . vitamin C  500 mg Oral Daily   Continuous Infusions: . ceFEPime (MAXIPIME) IV Stopped (01/02/18 1108)  . lactated ringers 100 mL/hr at 01/02/18 1535  . vancomycin Stopped (01/01/18 2150)     LOS: 3 days     Berle Mull, MD [facility] 01/02/2018, 4:55 PM  If 7PM-7AM, please contact night-coverage [email or web address] Password [email or web address] 01/02/2018, 4:55 PM

## 2018-01-02 NOTE — Progress Notes (Signed)
Palliative care progress note  Reason for consult: Goals of care/symptom management  I met this evening with Yolanda Watson.  She reports having poorly controlled pain.  Chart review reveals she has had 7 mg of Dilaudid in the last 24 hours.  We reviewed options for pain management including continuation of current regimen, initiation of PCA, or initiation of long-acting oral medication with continuation of Dilaudid IV for breakthrough.  I made recommendation initially for initiation of PCA, however, after discussing with her she is opposed to using end-tidal CO2 or pulse ox monitoring.  In looking at her overall clinical picture, I think it would be appropriate to consider initiation of palliative PCA without use of these if they are going to cause more discomfort to Ms. Kayleen Memos.  In talking with her today, though, she reports that her preference would be to add a long-acting pain medication and see if the combination of this and currently ordered Dilaudid is enough to relieve her pain.  In reviewing her overall usage in the last 24 hours, she has had 7 mg of Dilaudid.  This works out to an oral morphine equivalent of 140 mg or roughly 90 mg of oxycodone.  With appropriate reduction for consideration of cross tolerance, we will therefore start OxyContin 30 mg twice daily.  We will also continue with currently ordered Dilaudid 1 mg every 2 hours as needed.  In addition, she is concerned about having wound VAC change tomorrow.  I also placed another order for an additional dose of 1 mg daily of Dilaudid to be used in conjunction with dressing change.  She reports not having a bowel movement.  I made an substitution of senna-S 2 tabs twice daily in place of her currently ordered Colace. I also added MiraLAX as needed We also discussed again about waiting for results of biopsy prior to making further decisions regarding long-term goals of care.  Palliative care to continue to follow  Total time: 30  minutes Greater than 50%  of this time was spent counseling and coordinating care related to the above assessment and plan.  Micheline Rough, MD [facility] [phone]

## 2018-01-02 NOTE — Care Management Important Message (Signed)
Important Message  Patient Details  Name: Yolanda Watson MRN: [id number] Date of Birth: [birth year]   Medicare Important Message Given:  Yes    Darryn Kydd 01/02/2018, 2:03 PM
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
## 2018-01-02 NOTE — Progress Notes (Addendum)
  POD# 1 debridement abdominal wall, pubic bone  Temp:  [97.6 F (36.4 C)-98.2 F (36.8 C)] 97.8 F (36.6 C) (01/04 0517) Pulse Rate:  [77-94] 77 (01/04 0517) Resp:  [14-16] 16 (01/04 0517) BP: (109-120)/(59-72) 109/72 (01/04 0517) SpO2:  [97 %-98 %] 98 % (01/04 0517)   VAC 100 ml  Culture of soft tissue excision with necrotic bone is polymicrobial Culture of bone following debridement NGTD Pathology consistent with osteo  PE Abdomen soft, VAC in place with watery tan drainage canister   A/P  Will plan first VAC change on 1.5 or 1.6 by MD.  ID plan noted. PT eval today- patient with significant pain. She will need chronic pain management following discharge. Agree with PT- presently I do not feel she will be able to return home.  [name], MD [facility] Plastic & Reconstructive Surgery [phone], pin [phone]

## 2018-01-03 LAB — BASIC METABOLIC PANEL
ANION GAP: 6 (ref 5–15)
BUN: 6 mg/dL (ref 6–20)
CHLORIDE: 100 mmol/L — AB (ref 101–111)
CO2: 30 mmol/L (ref 22–32)
Calcium: 7.7 mg/dL — ABNORMAL LOW (ref 8.9–10.3)
Creatinine, Ser: 0.53 mg/dL (ref 0.44–1.00)
GFR calc Af Amer: >60 mL/min (ref >60)
GFR calc non Af Amer: >60 mL/min (ref >60)
Glucose, Bld: 107 mg/dL — ABNORMAL HIGH (ref 65–99)
POTASSIUM: 3.9 mmol/L (ref 3.5–5.1)
Sodium: 136 mmol/L (ref 135–145)

## 2018-01-03 LAB — AEROBIC CULTURE W GRAM STAIN (SUPERFICIAL SPECIMEN)

## 2018-01-03 LAB — AEROBIC CULTURE  (SUPERFICIAL SPECIMEN): CULTURE: NORMAL

## 2018-01-03 NOTE — Progress Notes (Signed)
  POD# 2 debridement abdominal wall, pubic bone  Temp:  [97.7 F (36.5 C)-98.6 F (37 C)] 98.6 F (37 C) (01/05 0518) Pulse Rate:  [69-86] 69 (01/05 0518) Resp:  [16] 16 (01/05 0518) BP: (110-132)/(60-69) 111/60 (01/05 0518) SpO2:  [95 %-98 %] 95 % (01/05 0518)   VAC 100 ml  Culture of soft tissue excision with necrotic bone is polymicrobial Culture of bone following debridement GBS Pathology consistent with osteo  PE Abdomen soft VAC change completed, continued epithelization over superior 2/3 wound. At 6 o clock unchanged undermining to bone and bone visible base wound. Overall significantly improved with regards to amount and type of drainage which remains watery tan   A/P  Continue VAC change three times per week- received additional Dilaudid for VAC change today, appreciate Palliative Med recommendations and follow up regarding this. She has no PCP and will nee OP chronic pain management.  May start three times per week VAC changes with wound RN, I will try to be present or complete one of these myself for wound exam.  Bone culture with S algactiae- this is a true bone biopsy completed on remaining bone after removal of grossly necrotic tissue. The culture labeled "debridement" I wound consider contaminated and no different from wound swab.   Continue to work with PT. Discussed with patient risk for pressure sores- she states she is able to raise her bottom and turn side to side to relieve pressure. If her mobility does not improve should consider LAL mattress.   [name], MD [facility] Plastic & Reconstructive Surgery [phone], pin [phone]

## 2018-01-03 NOTE — Progress Notes (Signed)
PROGRESS NOTE    Yolanda Watson  GEZ:[id number] DOB: 1951-04-15 DOA: 12/30/2017 PCP: Patient, No Pcp Per   Brief Narrative: Yolanda Watson is a 66 y.o. female with medical history significant of metastatic colon cancer to the abdominal wall s/p recent resection and reconstruction presenting with wound drainage.    Assessment & Plan:   Principal Problem:   Acute osteomyelitis of pelvic region [facility]) Active Problems:   Cancer of right colon (HCC)   Open wound of abdomen   Abdominal wall abscess at site of surgical wound   Osteomyelitis/Septic arthritis of pubic symphysis Abdominal wound Patient follows with general and plastic surgery as an outpatient. No elevated WBC and afebrile. Stable. Antibiotics started on admission -Infectious disease recommendations -Continue antibiotics -Plastic surgery recommends debridement underwent that and tolerated it very well on 01/01/2018.  Follow-up on culture.  Currently growing group B strep. -Orthopedic surgery consult  History of metastatic colon cancer S/p FOLFOX x5. Pathology (10/03/17) positive for adenocarcinoma. -Plastic surgery recommendations -Palliative care medicine consult completed, DNR/DNI Pain is not medically controlled, palliative care taking using PCA over the weekend for pain control.  Constipation. Bowel regimen changed.  DVT prophylaxis: Lovenox Code Status: DNR Family Communication: None at bedside Disposition Plan: When medically stable   Consultants:   Plastic surgery  General surgery  Infectious disease  Procedures:   None  Antimicrobials:  Vancomycin (1/1 >>  Cefepime (1/1 >>  Metronidazole (1/1 >>    Subjective: Constipated right now.  Objective: Vitals:   01/02/18 1440 01/02/18 2134 01/03/18 0518 01/03/18 1458  BP: 110/60 132/69 111/60 (!) 102/59  Pulse: 80 86 69 82  Resp: 16 16 16 16   Temp: 97.9 F (36.6 C) 97.7 F (36.5 C) 98.6 F (37 C) 98.5 F (36.9 C)  TempSrc: Oral Oral Oral  Oral  SpO2: 98% 96% 95% 97%  Weight:      Height:        Intake/Output Summary (Last 24 hours) at 01/03/2018 1851 Last data filed at 01/03/2018 1801 Gross per 24 hour  Intake 4021.67 ml  Output 3500 ml  Net 521.67 ml   Filed Weights   12/30/17 0752 12/30/17 1719  Weight: 49.9 kg (110 lb) 51.8 kg (114 lb 3.2 oz)    Examination:  General exam: Appears calm and comfortable Respiratory system: Clear to auscultation. Respiratory effort normal. Cardiovascular system: S1 & S2 heard, RRR. No murmurs, rubs, gallops or clicks. Gastrointestinal system: Abdomen is nondistended, soft and nontender. No organomegaly or masses felt. Normal bowel sounds heard. Dressing is clean. Central nervous system: Alert and oriented. No focal neurological deficits. Extremities: No edema. No calf tenderness Skin: No cyanosis. No rashes Psychiatry: Judgement and insight appear normal. Mood & affect appropriate.     Data Reviewed: I have personally reviewed following labs and imaging studies  CBC: Recent Labs  Lab 12/30/17 0837 12/31/17 0517 01/01/18 0440 01/02/18 0352  WBC 12.8* 9.3 9.9 10.1  NEUTROABS 11.2*  --   --  7.1  HGB 10.3* 8.6* 8.8* 8.2*  HCT 33.4* 27.4* 28.7* 27.4*  MCV 88.6 89.0 88.3 87.3  PLT 363 330 367 650   Basic Metabolic Panel: Recent Labs  Lab 12/30/17 0837 12/31/17 0517 01/01/18 0440 01/02/18 0352 01/03/18 0337  NA 135 134* 136 137 136  K 3.6 3.4* 3.4* 3.8 3.9  CL 96* 101 103 102 100*  CO2 25 27 28 30 30   GLUCOSE 115* 105* 96 118* 107*  BUN 14 6 <5* 8 6  CREATININE  0.55 0.48 0.53 0.53 0.53  CALCIUM 8.3* 7.8* 7.9* 7.8* 7.7*  MG  --   --   --  1.6*  --    GFR: Estimated Creatinine Clearance: 57.3 mL/min (by C-G formula based on SCr of 0.53 mg/dL). Liver Function Tests: Recent Labs  Lab 12/30/17 0837  AST 23  ALT 18  ALKPHOS 128*  BILITOT 0.5  PROT 6.2*  ALBUMIN 2.5*   No results for input(s): LIPASE, AMYLASE in the last 168 hours. No results for input(s):  AMMONIA in the last 168 hours. Coagulation Profile: No results for input(s): INR, PROTIME in the last 168 hours. Cardiac Enzymes: No results for input(s): CKTOTAL, CKMB, CKMBINDEX, TROPONINI in the last 168 hours. BNP (last 3 results) No results for input(s): PROBNP in the last 8760 hours. HbA1C: No results for input(s): HGBA1C in the last 72 hours. CBG: No results for input(s): GLUCAP in the last 168 hours. Lipid Profile: No results for input(s): CHOL, HDL, LDLCALC, TRIG, CHOLHDL, LDLDIRECT in the last 72 hours. Thyroid Function Tests: No results for input(s): TSH, T4TOTAL, FREET4, T3FREE, THYROIDAB in the last 72 hours. Anemia Panel: No results for input(s): VITAMINB12, FOLATE, FERRITIN, TIBC, IRON, RETICCTPCT in the last 72 hours. Sepsis Labs: Recent Labs  Lab 12/30/17 0844  LATICACIDVEN 1.26    Recent Results (from the past 240 hour(s))  Wound or Superficial Culture     Status: None   Collection Time: 12/30/17 10:45 AM  Result Value Ref Range Status   Specimen Description ABSCESS  Final   Special Requests NONE  Final   Gram Stain   Final    MODERATE WBC PRESENT, PREDOMINANTLY PMN ABUNDANT GRAM NEGATIVE RODS ABUNDANT GRAM POSITIVE COCCI IN PAIRS IN CLUSTERS FEW GRAM POSITIVE RODS    Culture NORMAL SKIN FLORA  Final   Report Status 01/03/2018 FINAL  Final  Surgical pcr screen     Status: None   Collection Time: 01/01/18 12:26 AM  Result Value Ref Range Status   MRSA, PCR NEGATIVE NEGATIVE Final   Staphylococcus aureus NEGATIVE NEGATIVE Final    Comment: (NOTE) The Xpert SA Assay (FDA approved for NASAL specimens in patients 29 years of age and older), is one component of a comprehensive surveillance program. It is not intended to diagnose infection nor to guide or monitor treatment.   Aerobic/Anaerobic Culture (surgical/deep wound)     Status: None (Preliminary result)   Collection Time: 01/01/18  7:51 AM  Result Value Ref Range Status   Specimen Description  WOUND ABDOMEN  Final   Special Requests WOUND DEBRIDEMENT SPEC A  Final   Gram Stain   Final    MODERATE WBC PRESENT,BOTH PMN AND MONONUCLEAR ABUNDANT GRAM POSITIVE COCCI IN PAIRS ABUNDANT GRAM NEGATIVE RODS FEW GRAM POSITIVE RODS Results Called to: S. DIXON AT 7035 ON 01/01/18 BY C. JESSUP, MLT.    Culture   Final    RARE GROUP B STREP(S.AGALACTIAE)ISOLATED TESTING AGAINST S. AGALACTIAE NOT ROUTINELY PERFORMED DUE TO PREDICTABILITY OF AMP/PEN/VAN SUSCEPTIBILITY. WITHIN MIXED CULTURE HOLDING FOR POSSIBLE ANAEROBE    Report Status PENDING  Incomplete  Aerobic/Anaerobic Culture (surgical/deep wound)     Status: None (Preliminary result)   Collection Time: 01/01/18  7:56 AM  Result Value Ref Range Status   Specimen Description BONE  Final   Special Requests ABDOMINAL WALL DEBRIDEMENT BONE SPEC B  Final   Gram Stain   Final    FEW WBC PRESENT, PREDOMINANTLY PMN NO ORGANISMS SEEN Results Called to: S. DIXON AT 0093  01/01/18 BY C. JEESUP, MLT.    Culture   Final    RARE GROUP B STREP(S.AGALACTIAE)ISOLATED TESTING AGAINST S. AGALACTIAE NOT ROUTINELY PERFORMED DUE TO PREDICTABILITY OF AMP/PEN/VAN SUSCEPTIBILITY. FEW VIRIDANS STREPTOCOCCUS CRITICAL RESULT CALLED TO, READ BACK BY AND VERIFIED WITH: C BAKER,RN AT 1459 01/02/18 BY L BENFIELD CONCERNING GROWTH ON CULTURE HOLDING FOR POSSIBLE ANAEROBE    Report Status PENDING  Incomplete         Radiology Studies: No results found.      Scheduled Meds: . enoxaparin (LOVENOX) injection  40 mg Subcutaneous Q24H  . feeding supplement (ENSURE ENLIVE)  237 mL Oral BID BM  . mouth rinse  15 mL Mouth Rinse BID  . methocarbamol  500 mg Oral TID  . metroNIDAZOLE  500 mg Oral Q8H  . multivitamin  1 tablet Oral Daily  . multivitamin with minerals  1 tablet Oral Daily  . oxyCODONE  30 mg Oral Q12H  . senna-docusate  2 tablet Oral BID  . sodium chloride flush  10-40 mL Intracatheter Q12H  . vitamin C  500 mg Oral Daily   Continuous  Infusions: . ceFEPime (MAXIPIME) IV 2 g (01/03/18 1754)  . lactated ringers 1 mL (01/03/18 1801)  . vancomycin 1,000 mg (01/03/18 1754)     LOS: 4 days     Berle Mull, MD [facility] 01/03/2018, 6:50 PM  If 7PM-7AM, please contact night-coverage [email or web address] Password [email or web address] 01/03/2018, 6:50 PM

## 2018-01-03 NOTE — Progress Notes (Signed)
Palliative care progress note  Reason for consult: Goals of care/symptom management  I met this afternoon with [name].  She reports having much better controlled pain.  Chart review reveals she has had 7 mg of Dilaudid in the last 24 hours in addition to oxycontin '30mg'$  BID.  I talked with her about increasing long acting medication, but she reports being very comfortable and wanting to keep same regimen for now.  Will continue to follow and adjust pain medications as appropriate.  She reports having a bowel movement today.  Continue senna-S 2 tabs twice daily.  Continue MiraLAX as needed.  We also discussed again about waiting for results of biopsy prior to making further decisions regarding long-term goals of care.  Palliative care to continue to follow  Total time: 20 minutes Greater than 50%  of this time was spent counseling and coordinating care related to the above assessment and plan.  Yolanda Rough, MD [place] Team [phone]

## 2018-01-04 ENCOUNTER — Telehealth: Payer: Self-pay | Admitting: Oncology

## 2018-01-04 LAB — BASIC METABOLIC PANEL
ANION GAP: 3 — AB (ref 5–15)
BUN: <5 mg/dL — ABNORMAL LOW (ref 6–20)
CHLORIDE: 99 mmol/L — AB (ref 101–111)
CO2: 29 mmol/L (ref 22–32)
Calcium: 8 mg/dL — ABNORMAL LOW (ref 8.9–10.3)
Creatinine, Ser: 0.48 mg/dL (ref 0.44–1.00)
GFR calc Af Amer: >60 mL/min (ref >60)
GFR calc non Af Amer: >60 mL/min (ref >60)
Glucose, Bld: 119 mg/dL — ABNORMAL HIGH (ref 65–99)
POTASSIUM: 3.8 mmol/L (ref 3.5–5.1)
Sodium: 131 mmol/L — ABNORMAL LOW (ref 135–145)

## 2018-01-04 MED ORDER — DEXTROSE 5 % IV SOLN
2.0000 g | INTRAVENOUS | Status: DC
  Administered 2018-01-04 – 2018-01-06 (×3): 2 g via INTRAVENOUS
  Filled 2018-01-04 (×3): qty 2

## 2018-01-04 NOTE — Progress Notes (Signed)
PROGRESS NOTE    Yolanda Watson  FAO:[id number] DOB: 01-20-1951 DOA: 12/30/2017 PCP: Patient, No Pcp Per   Brief Narrative: Yolanda Watson is a 67 y.o. female with medical history significant of metastatic colon cancer to the abdominal wall s/p recent resection and reconstruction presenting with wound drainage.    Assessment & Plan:   Principal Problem:   Acute osteomyelitis of pelvic region [facility]) Active Problems:   Cancer of right colon (HCC)   Open wound of abdomen   Abdominal wall abscess at site of surgical wound   Osteomyelitis/Septic arthritis of pubic symphysis Abdominal wound Patient follows with general and plastic surgery as an outpatient. No elevated WBC and afebrile. Stable. Antibiotics started on admission -Infectious disease recommendations -Continue antibiotics, narrowed too ceftriaxone today -Plastic surgery recommends debridement underwent that and tolerated it very well on 01/01/2018.  Follow-up on culture.  Currently growing group B strep.  History of metastatic colon cancer S/p FOLFOX x5. Pathology (10/03/17) positive for adenocarcinoma. -Plastic surgery recommendations -Palliative care medicine consult completed, DNR/DNI Pain is not medically controlled, palliative care taking using PCA over the weekend for pain control.  Constipation. Bowel regimen changed.  DVT prophylaxis: Lovenox Code Status: DNR Family Communication: None at bedside Disposition Plan: When medically stable   Consultants:   Plastic surgery  General surgery  Infectious disease  Procedures:   None  Antimicrobials:  Vancomycin (1/1 >>  Cefepime (1/1 >>  Metronidazole (1/1 >>    Subjective: Constipated right now.  Objective: Vitals:   01/03/18 1458 01/03/18 2124 01/04/18 0540 01/04/18 1400  BP: (!) 102/59 (!) 101/57 121/73 110/70  Pulse: 82 88 82 80  Resp: 16 17 18 17   Temp: 98.5 F (36.9 C) 99.1 F (37.3 C) 98.2 F (36.8 C) 98.4 F (36.9 C)  TempSrc: Oral  Oral Oral Oral  SpO2: 97% 95% 97% 97%  Weight:      Height:        Intake/Output Summary (Last 24 hours) at 01/04/2018 1720 Last data filed at 01/04/2018 1620 Gross per 24 hour  Intake 2930 ml  Output 3250 ml  Net -320 ml   Filed Weights   12/30/17 0752 12/30/17 1719  Weight: 49.9 kg (110 lb) 51.8 kg (114 lb 3.2 oz)    Examination:  General exam: Appears calm and comfortable Respiratory system: Clear to auscultation. Respiratory effort normal. Cardiovascular system: S1 & S2 heard, RRR. No murmurs, rubs, gallops or clicks. Gastrointestinal system: Abdomen is nondistended, soft and nontender. No organomegaly or masses felt. Normal bowel sounds heard. Dressing is clean. Central nervous system: Alert and oriented. No focal neurological deficits. Extremities: No edema. No calf tenderness Skin: No cyanosis. No rashes Psychiatry: Judgement and insight appear normal. Mood & affect appropriate.     Data Reviewed: I have personally reviewed following labs and imaging studies  CBC: Recent Labs  Lab 12/30/17 0837 12/31/17 0517 01/01/18 0440 01/02/18 0352  WBC 12.8* 9.3 9.9 10.1  NEUTROABS 11.2*  --   --  7.1  HGB 10.3* 8.6* 8.8* 8.2*  HCT 33.4* 27.4* 28.7* 27.4*  MCV 88.6 89.0 88.3 87.3  PLT 363 330 367 696   Basic Metabolic Panel: Recent Labs  Lab 12/31/17 0517 01/01/18 0440 01/02/18 0352 01/03/18 0337 01/04/18 0331  NA 134* 136 137 136 131*  K 3.4* 3.4* 3.8 3.9 3.8  CL 101 103 102 100* 99*  CO2 27 28 30 30 29   GLUCOSE 105* 96 118* 107* 119*  BUN 6 <5* 8 6 <5*  CREATININE 0.48 0.53 0.53 0.53 0.48  CALCIUM 7.8* 7.9* 7.8* 7.7* 8.0*  MG  --   --  1.6*  --   --    GFR: Estimated Creatinine Clearance: 57.3 mL/min (by C-G formula based on SCr of 0.48 mg/dL). Liver Function Tests: Recent Labs  Lab 12/30/17 0837  AST 23  ALT 18  ALKPHOS 128*  BILITOT 0.5  PROT 6.2*  ALBUMIN 2.5*   No results for input(s): LIPASE, AMYLASE in the last 168 hours. No results for  input(s): AMMONIA in the last 168 hours. Coagulation Profile: No results for input(s): INR, PROTIME in the last 168 hours. Cardiac Enzymes: No results for input(s): CKTOTAL, CKMB, CKMBINDEX, TROPONINI in the last 168 hours. BNP (last 3 results) No results for input(s): PROBNP in the last 8760 hours. HbA1C: No results for input(s): HGBA1C in the last 72 hours. CBG: No results for input(s): GLUCAP in the last 168 hours. Lipid Profile: No results for input(s): CHOL, HDL, LDLCALC, TRIG, CHOLHDL, LDLDIRECT in the last 72 hours. Thyroid Function Tests: No results for input(s): TSH, T4TOTAL, FREET4, T3FREE, THYROIDAB in the last 72 hours. Anemia Panel: No results for input(s): VITAMINB12, FOLATE, FERRITIN, TIBC, IRON, RETICCTPCT in the last 72 hours. Sepsis Labs: Recent Labs  Lab 12/30/17 0844  LATICACIDVEN 1.26    Recent Results (from the past 240 hour(s))  Wound or Superficial Culture     Status: None   Collection Time: 12/30/17 10:45 AM  Result Value Ref Range Status   Specimen Description ABSCESS  Final   Special Requests NONE  Final   Gram Stain   Final    MODERATE WBC PRESENT, PREDOMINANTLY PMN ABUNDANT GRAM NEGATIVE RODS ABUNDANT GRAM POSITIVE COCCI IN PAIRS IN CLUSTERS FEW GRAM POSITIVE RODS    Culture NORMAL SKIN FLORA  Final   Report Status 01/03/2018 FINAL  Final  Surgical pcr screen     Status: None   Collection Time: 01/01/18 12:26 AM  Result Value Ref Range Status   MRSA, PCR NEGATIVE NEGATIVE Final   Staphylococcus aureus NEGATIVE NEGATIVE Final    Comment: (NOTE) The Xpert SA Assay (FDA approved for NASAL specimens in patients 40 years of age and older), is one component of a comprehensive surveillance program. It is not intended to diagnose infection nor to guide or monitor treatment.   Aerobic/Anaerobic Culture (surgical/deep wound)     Status: None (Preliminary result)   Collection Time: 01/01/18  7:51 AM  Result Value Ref Range Status   Specimen  Description WOUND ABDOMEN  Final   Special Requests WOUND DEBRIDEMENT SPEC A  Final   Gram Stain   Final    MODERATE WBC PRESENT,BOTH PMN AND MONONUCLEAR ABUNDANT GRAM POSITIVE COCCI IN PAIRS ABUNDANT GRAM NEGATIVE RODS FEW GRAM POSITIVE RODS Results Called to: S. DIXON AT 5621 ON 01/01/18 BY C. JESSUP, MLT.    Culture   Final    RARE GROUP B STREP(S.AGALACTIAE)ISOLATED TESTING AGAINST S. AGALACTIAE NOT ROUTINELY PERFORMED DUE TO PREDICTABILITY OF AMP/PEN/VAN SUSCEPTIBILITY. WITHIN MIXED CULTURE HOLDING FOR POSSIBLE ANAEROBE    Report Status PENDING  Incomplete  Aerobic/Anaerobic Culture (surgical/deep wound)     Status: None (Preliminary result)   Collection Time: 01/01/18  7:56 AM  Result Value Ref Range Status   Specimen Description BONE  Final   Special Requests ABDOMINAL WALL DEBRIDEMENT BONE SPEC B  Final   Gram Stain   Final    FEW WBC PRESENT, PREDOMINANTLY PMN NO ORGANISMS SEEN Results Called to: S. DIXON AT  1020 01/01/18 BY C. JEESUP, MLT.    Culture   Final    RARE GROUP B STREP(S.AGALACTIAE)ISOLATED TESTING AGAINST S. AGALACTIAE NOT ROUTINELY PERFORMED DUE TO PREDICTABILITY OF AMP/PEN/VAN SUSCEPTIBILITY. FEW VIRIDANS STREPTOCOCCUS RARE DIPHTHEROIDS(CORYNEBACTERIUM SPECIES) Standardized susceptibility testing for this organism is not available. HOLDING FOR POSSIBLE ANAEROBE CRITICAL RESULT CALLED TO, READ BACK BY AND VERIFIED WITH: C BAKER,RN AT 1459 01/02/18 BY L BENFIELD CONCERNING GROWTH ON CULTURE    Report Status PENDING  Incomplete   Organism ID, Bacteria VIRIDANS STREPTOCOCCUS  Final      Susceptibility   Viridans streptococcus - MIC*    PENICILLIN <=0.06 SENSITIVE Sensitive     CEFTRIAXONE 0.25 SENSITIVE Sensitive     ERYTHROMYCIN <=0.12 SENSITIVE Sensitive     LEVOFLOXACIN 0.5 SENSITIVE Sensitive     VANCOMYCIN 0.5 SENSITIVE Sensitive     * FEW VIRIDANS STREPTOCOCCUS         Radiology Studies: No results found.      Scheduled Meds: . enoxaparin  (LOVENOX) injection  40 mg Subcutaneous Q24H  . feeding supplement (ENSURE ENLIVE)  237 mL Oral BID BM  . mouth rinse  15 mL Mouth Rinse BID  . methocarbamol  500 mg Oral TID  . multivitamin  1 tablet Oral Daily  . multivitamin with minerals  1 tablet Oral Daily  . oxyCODONE  30 mg Oral Q12H  . senna-docusate  2 tablet Oral BID  . sodium chloride flush  10-40 mL Intracatheter Q12H  . vitamin C  500 mg Oral Daily   Continuous Infusions: . cefTRIAXone (ROCEPHIN)  IV 2 g (01/04/18 1620)  . lactated ringers 100 mL/hr at 01/04/18 1621     LOS: 5 days     Berle Mull, MD [facility] 01/04/2018, 5:20 PM  If 7PM-7AM, please contact night-coverage [email or web address] Password [email or web address] 01/04/2018, 5:20 PM

## 2018-01-04 NOTE — Progress Notes (Signed)
Pt is s/p abdominal/pelvic debridement and chronic osteomyelitis of the pelvis.  Bone culture done is significant for GBS and pansensitive Strep viridans.  Other wound culture from OR also with GBS.  I will narrow her antibiotics to ceftriaxone.  She has a penicillin allergy but is tolerating cefepime.   Dr. Megan Salon back tomorrow Thayer Headings, MD

## 2018-01-04 NOTE — Telephone Encounter (Signed)
CALLED TO INFORM PT OF APPT  CHG FROM 1/21 TO 1/29 @ 2.45P. NO ANSWER/NO VM. MAILED NEW APPT CALENDAR.

## 2018-01-04 NOTE — Progress Notes (Signed)
Pharmacy Antibiotic Note  Yolanda Watson is a 67 y.o. female admitted on 12/30/2017 with abdominal wound/pelvic osteomyelitis.  Pharmacy has been consulted for vancomycin dosing.  Day #4 of abx for wound infx/pelvic osteo. Cx/s pending from OR showing multiple organisms on gram stain. Group B and strep viridans isolated on debridement cx but other cx's still pending. Afebrile, WBC wnl.  Plan: Continue vancomycin 1g IV q24 hours  Continue cefepime 2 gm IV Q 8 hours Continue metronidazole 500 mg IV q 8 hours Monitor clinical picture, renal function, VT prn F/U C&S, abx deescalation / LOT   Height: 5\' 6"  (167.6 cm) Weight: 114 lb 3.2 oz (51.8 kg) IBW/kg (Calculated) : 59.3  Temp (24hrs), Avg:98.6 F (37 C), Min:98.2 F (36.8 C), Max:99.1 F (37.3 C)  Recent Labs  Lab 12/30/17 0837 12/30/17 0844 12/31/17 0517 01/01/18 0440 01/02/18 0352 01/03/18 0337 01/04/18 0331  WBC 12.8*  --  9.3 9.9 10.1  --   --   CREATININE 0.55  --  0.48 0.53 0.53 0.53 0.48  LATICACIDVEN  --  1.26  --   --   --   --   --     Estimated Creatinine Clearance: 57.3 mL/min (by C-G formula based on SCr of 0.48 mg/dL).    Allergies  Allergen Reactions  . Penicillins Other (See Comments)    Tolerated cefepime 01/01/2018 "PASSED OUT AS A CHILD" > ? SYNCOPE ? PATIENT HAS HAD A PCN REACTION WITH IMMEDIATE RASH, FACIAL/TONGUE/THROAT SWELLING, SOB, OR LIGHTHEADEDNESS WITH HYPOTENSION:  #  #  #  YES  #  #  #   Has patient had a PCN reaction causing severe rash involving mucus membranes or skin necrosis: No Has patient had a PCN reaction that required hospitalization: No Has patient had a PCN reaction occurring within the last 10 years: No   . Latex Itching and Rash    BED "CHUX PADS"    Antimicrobials this admission: 1/1 Vanc>>1/2, 1/3>> 1/1 Cefepime >>1/2, 1/3>> 1/1>>1/2  Metronidazole >>, 1/3>>   Microbiology results: 1/1 Abdominal wound swab: Reincubating 1/3 wound: gm positive and negative rods, gm  positive cocci in pairs  1/3 pelvic Bone: No growth   Thank you for allowing pharmacy to be a part of this patient's care.  Elenor Quinones, PharmD, BCPS Clinical Pharmacist Pager [phone] 01/04/2018 11:58 AM

## 2018-01-05 LAB — BASIC METABOLIC PANEL
ANION GAP: 8 (ref 5–15)
BUN: <5 mg/dL — ABNORMAL LOW (ref 6–20)
CALCIUM: 8.1 mg/dL — AB (ref 8.9–10.3)
CO2: 28 mmol/L (ref 22–32)
CREATININE: 0.57 mg/dL (ref 0.44–1.00)
Chloride: 98 mmol/L — ABNORMAL LOW (ref 101–111)
GFR calc non Af Amer: >60 mL/min (ref >60)
Glucose, Bld: 98 mg/dL (ref 65–99)
Potassium: 3.7 mmol/L (ref 3.5–5.1)
SODIUM: 134 mmol/L — AB (ref 135–145)

## 2018-01-05 MED ORDER — METRONIDAZOLE 500 MG PO TABS
500.0000 mg | ORAL_TABLET | Freq: Three times a day (TID) | ORAL | Status: DC
  Administered 2018-01-05 – 2018-01-06 (×4): 500 mg via ORAL
  Filled 2018-01-05 (×4): qty 1

## 2018-01-05 MED ORDER — ALUM & MAG HYDROXIDE-SIMETH 200-200-20 MG/5ML PO SUSP
15.0000 mL | ORAL | Status: DC | PRN
  Administered 2018-01-05: 15 mL via ORAL
  Filled 2018-01-05: qty 30

## 2018-01-05 MED ORDER — OXYCODONE HCL 5 MG PO TABS
10.0000 mg | ORAL_TABLET | ORAL | Status: DC | PRN
  Administered 2018-01-06 (×3): 10 mg via ORAL
  Filled 2018-01-05 (×4): qty 2

## 2018-01-05 MED ORDER — HYDROMORPHONE HCL 1 MG/ML IJ SOLN
1.0000 mg | INTRAMUSCULAR | Status: DC | PRN
  Filled 2018-01-05: qty 1

## 2018-01-05 NOTE — Progress Notes (Signed)
PROGRESS NOTE    Yolanda Watson  YQM:[id number] DOB: 08/30/1951 DOA: 12/30/2017 PCP: Patient, No Pcp Per   Brief Narrative: Yolanda Watson is a 67 y.o. female with medical history significant of metastatic colon cancer to the abdominal wall s/p recent resection and reconstruction presenting with wound drainage.    Assessment & Plan:   Principal Problem:   Acute osteomyelitis of pelvic region [facility]) Active Problems:   Cancer of right colon (HCC)   Open wound of abdomen   Abdominal wall abscess at site of surgical wound   Osteomyelitis/Septic arthritis of pubic symphysis Abdominal wound Patient follows with general and plastic surgery as an outpatient. No elevated WBC and afebrile. Stable. Antibiotics started on admission -Infectious disease recommendations -Continue antibiotics, narrowed too ceftriaxone and Flagyl -Plastic surgery recommends debridement underwent that and tolerated it very well on 01/01/2018.  Follow-up on culture.  Currently growing group B strep. ID recommends IV ceftriaxone and Flagyl on discharge.  History of metastatic colon cancer S/p FOLFOX x5. Pathology (10/03/17) positive for adenocarcinoma. -Plastic surgery recommendations -Palliative care medicine consult completed, DNR/DNI Pain is not medically controlled, palliative care taking using PCA over the weekend for pain control.  Constipation. Bowel regimen changed.  DVT prophylaxis: Lovenox Code Status: DNR Family Communication: None at bedside Disposition Plan: When medically stable   Consultants:   Plastic surgery  General surgery  Infectious disease  Procedures:   None  Antimicrobials:  Vancomycin (1/1 >>  Cefepime (1/1 >>  Metronidazole (1/1 >>    Subjective: Constipated right now.  Objective: Vitals:   01/04/18 1400 01/04/18 2229 01/05/18 0510 01/05/18 1433  BP: 110/70 (!) 121/58 (!) 102/53 (!) 106/59  Pulse: 80 80 86 81  Resp: 17 18 18 20   Temp: 98.4 F (36.9 C) 98.9 F  (37.2 C) 99.1 F (37.3 C) 98.2 F (36.8 C)  TempSrc: Oral Oral Oral Oral  SpO2: 97% 95% 93% 95%  Weight:      Height:        Intake/Output Summary (Last 24 hours) at 01/05/2018 1759 Last data filed at 01/05/2018 1300 Gross per 24 hour  Intake 2383.34 ml  Output 551 ml  Net 1832.34 ml   Filed Weights   12/30/17 0752 12/30/17 1719  Weight: 49.9 kg (110 lb) 51.8 kg (114 lb 3.2 oz)    Examination:  General exam: Appears calm and comfortable Respiratory system: Clear to auscultation. Respiratory effort normal. Cardiovascular system: S1 & S2 heard, RRR. No murmurs, rubs, gallops or clicks. Gastrointestinal system: Abdomen is nondistended, soft and nontender. No organomegaly or masses felt. Normal bowel sounds heard. Dressing is clean. Central nervous system: Alert and oriented. No focal neurological deficits. Extremities: No edema. No calf tenderness Skin: No cyanosis. No rashes Psychiatry: Judgement and insight appear normal. Mood & affect appropriate.     Data Reviewed: I have personally reviewed following labs and imaging studies  CBC: Recent Labs  Lab 12/30/17 0837 12/31/17 0517 01/01/18 0440 01/02/18 0352  WBC 12.8* 9.3 9.9 10.1  NEUTROABS 11.2*  --   --  7.1  HGB 10.3* 8.6* 8.8* 8.2*  HCT 33.4* 27.4* 28.7* 27.4*  MCV 88.6 89.0 88.3 87.3  PLT 363 330 367 528   Basic Metabolic Panel: Recent Labs  Lab 01/01/18 0440 01/02/18 0352 01/03/18 0337 01/04/18 0331 01/05/18 0419  NA 136 137 136 131* 134*  K 3.4* 3.8 3.9 3.8 3.7  CL 103 102 100* 99* 98*  CO2 28 30 30 29 28   GLUCOSE 96 118*  107* 119* 98  BUN <5* 8 6 <5* <5*  CREATININE 0.53 0.53 0.53 0.48 0.57  CALCIUM 7.9* 7.8* 7.7* 8.0* 8.1*  MG  --  1.6*  --   --   --    GFR: Estimated Creatinine Clearance: 57.3 mL/min (by C-G formula based on SCr of 0.57 mg/dL). Liver Function Tests: Recent Labs  Lab 12/30/17 0837  AST 23  ALT 18  ALKPHOS 128*  BILITOT 0.5  PROT 6.2*  ALBUMIN 2.5*   No results for  input(s): LIPASE, AMYLASE in the last 168 hours. No results for input(s): AMMONIA in the last 168 hours. Coagulation Profile: No results for input(s): INR, PROTIME in the last 168 hours. Cardiac Enzymes: No results for input(s): CKTOTAL, CKMB, CKMBINDEX, TROPONINI in the last 168 hours. BNP (last 3 results) No results for input(s): PROBNP in the last 8760 hours. HbA1C: No results for input(s): HGBA1C in the last 72 hours. CBG: No results for input(s): GLUCAP in the last 168 hours. Lipid Profile: No results for input(s): CHOL, HDL, LDLCALC, TRIG, CHOLHDL, LDLDIRECT in the last 72 hours. Thyroid Function Tests: No results for input(s): TSH, T4TOTAL, FREET4, T3FREE, THYROIDAB in the last 72 hours. Anemia Panel: No results for input(s): VITAMINB12, FOLATE, FERRITIN, TIBC, IRON, RETICCTPCT in the last 72 hours. Sepsis Labs: Recent Labs  Lab 12/30/17 0844  LATICACIDVEN 1.26    Recent Results (from the past 240 hour(s))  Wound or Superficial Culture     Status: None   Collection Time: 12/30/17 10:45 AM  Result Value Ref Range Status   Specimen Description ABSCESS  Final   Special Requests NONE  Final   Gram Stain   Final    MODERATE WBC PRESENT, PREDOMINANTLY PMN ABUNDANT GRAM NEGATIVE RODS ABUNDANT GRAM POSITIVE COCCI IN PAIRS IN CLUSTERS FEW GRAM POSITIVE RODS    Culture NORMAL SKIN FLORA  Final   Report Status 01/03/2018 FINAL  Final  Surgical pcr screen     Status: None   Collection Time: 01/01/18 12:26 AM  Result Value Ref Range Status   MRSA, PCR NEGATIVE NEGATIVE Final   Staphylococcus aureus NEGATIVE NEGATIVE Final    Comment: (NOTE) The Xpert SA Assay (FDA approved for NASAL specimens in patients 109 years of age and older), is one component of a comprehensive surveillance program. It is not intended to diagnose infection nor to guide or monitor treatment.   Aerobic/Anaerobic Culture (surgical/deep wound)     Status: None (Preliminary result)   Collection Time:  01/01/18  7:51 AM  Result Value Ref Range Status   Specimen Description WOUND ABDOMEN  Final   Special Requests WOUND DEBRIDEMENT SPEC A  Final   Gram Stain   Final    MODERATE WBC PRESENT,BOTH PMN AND MONONUCLEAR ABUNDANT GRAM POSITIVE COCCI IN PAIRS ABUNDANT GRAM NEGATIVE RODS FEW GRAM POSITIVE RODS Results Called to: S. DIXON AT 7858 ON 01/01/18 BY C. JESSUP, MLT.    Culture   Final    RARE GROUP B STREP(S.AGALACTIAE)ISOLATED TESTING AGAINST S. AGALACTIAE NOT ROUTINELY PERFORMED DUE TO PREDICTABILITY OF AMP/PEN/VAN SUSCEPTIBILITY. WITHIN MIXED CULTURE HOLDING FOR POSSIBLE ANAEROBE    Report Status PENDING  Incomplete  Aerobic/Anaerobic Culture (surgical/deep wound)     Status: None (Preliminary result)   Collection Time: 01/01/18  7:56 AM  Result Value Ref Range Status   Specimen Description BONE  Final   Special Requests ABDOMINAL WALL DEBRIDEMENT BONE SPEC B  Final   Gram Stain   Final    FEW WBC PRESENT,  PREDOMINANTLY PMN NO ORGANISMS SEEN Results Called to: S. DIXON AT 0258 01/01/18 BY C. JEESUP, MLT.    Culture   Final    RARE GROUP B STREP(S.AGALACTIAE)ISOLATED TESTING AGAINST S. AGALACTIAE NOT ROUTINELY PERFORMED DUE TO PREDICTABILITY OF AMP/PEN/VAN SUSCEPTIBILITY. FEW VIRIDANS STREPTOCOCCUS RARE DIPHTHEROIDS(CORYNEBACTERIUM SPECIES) Standardized susceptibility testing for this organism is not available. HOLDING FOR POSSIBLE ANAEROBE CRITICAL RESULT CALLED TO, READ BACK BY AND VERIFIED WITH: C BAKER,RN AT 1459 01/02/18 BY L BENFIELD CONCERNING GROWTH ON CULTURE    Report Status PENDING  Incomplete   Organism ID, Bacteria VIRIDANS STREPTOCOCCUS  Final      Susceptibility   Viridans streptococcus - MIC*    PENICILLIN <=0.06 SENSITIVE Sensitive     CEFTRIAXONE 0.25 SENSITIVE Sensitive     ERYTHROMYCIN <=0.12 SENSITIVE Sensitive     LEVOFLOXACIN 0.5 SENSITIVE Sensitive     VANCOMYCIN 0.5 SENSITIVE Sensitive     * FEW VIRIDANS STREPTOCOCCUS         Radiology  Studies: No results found.      Scheduled Meds: . enoxaparin (LOVENOX) injection  40 mg Subcutaneous Q24H  . feeding supplement (ENSURE ENLIVE)  237 mL Oral BID BM  . mouth rinse  15 mL Mouth Rinse BID  . methocarbamol  500 mg Oral TID  . metroNIDAZOLE  500 mg Oral Q8H  . multivitamin  1 tablet Oral Daily  . multivitamin with minerals  1 tablet Oral Daily  . oxyCODONE  30 mg Oral Q12H  . senna-docusate  2 tablet Oral BID  . sodium chloride flush  10-40 mL Intracatheter Q12H  . vitamin C  500 mg Oral Daily   Continuous Infusions: . cefTRIAXone (ROCEPHIN)  IV Stopped (01/05/18 1327)  . lactated ringers 100 mL/hr at 01/05/18 0253     LOS: 6 days     Berle Mull, MD [facility] 01/05/2018, 5:59 PM  If 7PM-7AM, please contact night-coverage [email or web address] Password [email or web address] 01/05/2018, 5:59 PM

## 2018-01-05 NOTE — Consult Note (Addendum)
[place] Nurse wound consult note Reason for Consult: Consult requested for Vac dressing change.  Plastic surgery team performed first post-op dressing change on Sat Wound type: Full thickness wound to middle lower abd Measurement: 11X7X.2cm with tunneling area at 6:00 o'clock with a depth of 2 cm when swab was inserted. Wound bed: red and moist Drainage (amount, consistency, odor) Mod amt tan drainage in the cannister Periwound: intact skin surrounding Dressing procedure/placement/frequency: Applied one piece black foam to lower deeper wound area, then Mepitel contact layer, then another piece of black foam to 128mm cont suction.  Pt tolerated with minimal amt discomfort after pain meds were given earlier.   Pt plans to discharge with a home vac, if she is still here on Wed, WOC will change the Vac at that time. Julien Girt MSN, RN, [place], [place], [place]

## 2018-01-05 NOTE — Progress Notes (Signed)
[place] will provide [facility] services and [facility] services for home IV ABX at DC to home.  [facility] will provide in hospital teaching with pt/CG to support independence at home with IV ABX.   If patient discharges after hours, please call [phone].   Larry Sierras 01/05/2018, 12:29 PM

## 2018-01-05 NOTE — Care Management Note (Signed)
Case Management Note  Patient Details  Name: Yolanda Watson MRN: [id number] Date of Birth: Nov 15, 1951  Subjective/Objective:                    Action/Plan: Spoke to patient and husband at bedside regarding disposition. Patient wanting to go home at discharge instead of SNF.   Patient has charity VAC at home that husband is sending back to [facility]. New home VAC application in shadow chart for MD signature. [place] nurse to change [facility] today will await measurements and wound description.   Discussed home IV ABX . Patient and husband stated they were unaware she would need home IV ABX. Await Dr Hale Bogus note.   Patient previously had [facility] , same following.   Messaged MD for orders and signature.  Expected Discharge Date:  01/02/18               Expected Discharge Plan:  [place]  In-House Referral:     Discharge planning Services  CM Consult  Post Acute Care Choice:  Home Health, Durable Medical Equipment Choice offered to:  Patient, Spouse  DME Arranged:  Vac DME Agency:  [facility]  HH Arranged:  RN [place] Agency:  [place]  Status of Service:  In process, will continue to follow  If discussed at Long Length of Stay Meetings, dates discussed:    Additional Comments:  Marilu Favre, RN 01/05/2018, 11:10 AM

## 2018-01-05 NOTE — Care Management Note (Signed)
Case Management Note  Patient Details  Name: Yolanda Watson MRN: [id number] Date of Birth: 05-Jan-1951  Subjective/Objective:                    Action/Plan: Patient agreeable to short term rehab now. SW [place] aware and following.   Discussed PCP with patient . She will call her husband's MD at [facility] ( Dr Lynnette Caffey) and see if he is accepting new patient's and make an appointment .   Expected Discharge Date:  01/02/18               Expected Discharge Plan:  Skilled Nursing Facility  In-House Referral:  Clinical Social Work  Discharge planning Services  CM Consult  Post Acute Care Choice:  Home Health, Durable Medical Equipment Choice offered to:  Patient, Spouse, Parent  DME Arranged:  Vac DME Agency:  [facility]  HH Arranged:  RN [place] Agency:  [place]  Status of Service:  In process, will continue to follow  If discussed at Long Length of Stay Meetings, dates discussed:    Additional Comments:  Marilu Favre, RN 01/05/2018, 2:38 PM

## 2018-01-05 NOTE — Social Work (Signed)
CSW consulted by RN Case Manager in regards to SNF placement for IV antibiotics and care before discharging home again.   CSW spoke with pt and pt husband at bedside. Pt is very pleasant and oriented.  Pt states that she would like to avoid a SNF placement and return home with her husband.   CSW has alerted RN Case Manager with pt stated desire to discharge to home.   CSW signing off. Please consult if any additional needs arise.  Alexander [place], [place] Work [phone]
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
## 2018-01-05 NOTE — Progress Notes (Signed)
  POD# 4 debridement abdominal wall, pubic bone  Temp:  [98.4 F (36.9 C)-99.1 F (37.3 C)] 99.1 F (37.3 C) (01/07 0510) Pulse Rate:  [80-86] 86 (01/07 0510) Resp:  [17-18] 18 (01/07 0510) BP: (102-121)/(53-70) 102/53 (01/07 0510) SpO2:  [93 %-97 %] 93 % (01/07 0510)   VAC 50 ml  Culture of soft tissue excision with necrotic bone is polymicrobial Culture of bone following debridement S algaciae, viridans, coryneabacterium Pathology consistent with osteo  PE Abdomen soft VAC change completed by wound RN this am, drainage watery tan, decreased amount  A/P  Continue VAC change three times per week- received additional Dilaudid for VAC changes. She has no PCP and will need OP chronic pain management.  Patient continues to report she wants to go home. I am not sure how husband will be able to care for her in this setting with her current immobility and unable to get out of bed. If she discharges home would get LAL mattress for home as high risk pressure sore. As she has not been out of bed here, would order this for here as well.   [name], MD [facility] Plastic & Reconstructive Surgery [phone], pin [phone]

## 2018-01-05 NOTE — NC FL2 (Signed)
[place] MEDICAID FL2 LEVEL OF CARE SCREENING TOOL     IDENTIFICATION  Patient Name: Yolanda Watson Birthdate: [birth year] Sex: female Admission Date (Current Location): 12/30/2017  [facility] and [place] Number:  Herbalist and Address:  [facility] [place]. [facility], [place] [address] [place], [place] [address]      Provider Number: [id number]  Attending Physician Name and Address:  Lavina Hamman, MD  Relative Name and Phone Number:  Shamonica Schadt, husband, [phone]    Current Level of Care: Hospital Recommended Level of Care: [place] Prior Approval Number:    Date Approved/Denied:  01/05/2018 PASRR Number:   [id number]   Discharge Plan: SNF    Current Diagnoses: Patient Active Problem List   Diagnosis Date Noted  . Abdominal wall abscess at site of surgical wound   . Acute osteomyelitis of pelvic region ([place])   . Open wound of abdomen 11/14/2017  . Port-A-Cath in place 10/21/2017  . Local recurrence of colon cancer ([place]) 10/03/2017  . 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 Cancer of right colon ([place]) 05/05/2017    Orientation RESPIRATION BLADDER Height & Weight     Self, Time, Situation, Place  Normal External catheter Weight: 114 lb 3.2 oz (51.8 kg) Height:  5\' 6"  (167.6 cm)  BEHAVIORAL SYMPTOMS/MOOD NEUROLOGICAL BOWEL NUTRITION STATUS      Continent Diet  AMBULATORY STATUS COMMUNICATION OF NEEDS Skin   Extensive Assist Verbally Surgical wounds, Wound Vac(8x8 circular incision at base of abdomen)                       Personal Care Assistance Level of Assistance  Bathing, Feeding, Dressing Bathing Assistance: Maximum assistance Feeding assistance: Limited assistance Dressing Assistance: Maximum assistance     Functional Limitations Info  Sight, Hearing, Speech Sight Info: Adequate Hearing Info: Adequate Speech Info: Adequate    SPECIAL CARE FACTORS FREQUENCY  PT (By licensed PT), OT  (By licensed OT)     PT Frequency: 5x week OT Frequency: 5x week            Contractures Contractures Info: Present(Acquired bilateral hand deformity; Little range of motion)    Additional Factors Info  Code Status, Allergies Code Status Info: DNR Allergies Info: Penicillins, Latex           Current Medications (01/05/2018):  This is the current hospital active medication list Current Facility-Administered Medications  Medication Dose Route Frequency Provider Last Rate Last Dose  . acetaminophen (TYLENOL) tablet 650 mg  650 mg Oral Q6H PRN Karmen Bongo, MD       Or  . acetaminophen (TYLENOL) suppository 650 mg  650 mg Rectal Q6H PRN Karmen Bongo, MD      . alum & mag hydroxide-simeth (MAALOX/MYLANTA) 200-200-20 MG/5ML suspension 15 mL  15 mL Oral Q4H PRN Lavina Hamman, MD   15 mL at 01/05/18 1113  . cefTRIAXone (ROCEPHIN) 2 g in dextrose 5 % 50 mL IVPB  2 g Intravenous Q24H Thayer Headings, MD   Stopped at 01/05/18 1327  . enoxaparin (LOVENOX) injection 40 mg  40 mg Subcutaneous Q24H Mariel Aloe, MD   40 mg at 01/03/18 1255  . feeding supplement (ENSURE ENLIVE) (ENSURE ENLIVE) liquid 237 mL  237 mL Oral BID BM Thimmappa, Brinda, MD      . HYDROmorphone (DILAUDID) injection 1 mg  1 mg Intravenous Q2H PRN Karmen Bongo, MD   1 mg at  01/05/18 1125  . HYDROmorphone (DILAUDID) injection 1 mg  1 mg Intravenous Daily PRN Micheline Rough, MD      . lactated ringers infusion   Intravenous Continuous Karmen Bongo, MD 100 mL/hr at 01/05/18 0253    . LORazepam (ATIVAN) injection 1 mg  1 mg Intravenous Q4H PRN Karmen Bongo, MD      . MEDLINE mouth rinse  15 mL Mouth Rinse BID Karmen Bongo, MD   15 mL at 01/02/18 2131  . methocarbamol (ROBAXIN) tablet 500 mg  500 mg Oral TID Lavina Hamman, MD   500 mg at 01/05/18 1629  . metroNIDAZOLE (FLAGYL) tablet 500 mg  500 mg Oral Q8H Michel Bickers, MD   500 mg at 01/05/18 1112  . multivitamin (PROSIGHT) tablet 1 tablet  1 tablet  Oral Daily [name], MD   1 tablet at 01/05/18 0855  . multivitamin with minerals tablet 1 tablet  1 tablet Oral Daily [name], MD   1 tablet at 01/05/18 0855  . ondansetron (ZOFRAN) tablet 4 mg  4 mg Oral Q6H PRN Karmen Bongo, MD       Or  . ondansetron [facility]) injection 4 mg  4 mg Intravenous Q6H PRN Karmen Bongo, MD      . oxyCODONE (OXYCONTIN) 12 hr tablet 30 mg  30 mg Oral Q12H Micheline Rough, MD   30 mg at 01/05/18 0854  . polyethylene glycol (MIRALAX / GLYCOLAX) packet 17 g  17 g Oral Daily PRN Micheline Rough, MD      . senna-docusate (Senokot-S) tablet 2 tablet  2 tablet Oral BID Micheline Rough, MD   2 tablet at 01/03/18 2303  . sodium chloride flush (NS) 0.9 % injection 10-40 mL  10-40 mL Intracatheter Q12H Karmen Bongo, MD   10 mL at 01/03/18 1023  . sodium chloride flush (NS) 0.9 % injection 10-40 mL  10-40 mL Intracatheter PRN Karmen Bongo, MD   10 mL at 01/01/18 0454  . vitamin C (ASCORBIC ACID) tablet 500 mg  500 mg Oral Daily [name], MD   500 mg at 01/05/18 6967   Facility-Administered Medications Ordered in Other Encounters  Medication Dose Route Frequency Provider Last Rate Last Dose  . sodium chloride flush (NS) 0.9 % injection 10 mL  10 mL Intravenous PRN Ladell Pier, MD   10 mL at 07/24/17 8938     Discharge Medications: Please see discharge summary for a list of discharge medications.  Relevant Imaging Results:  Relevant Lab Results:   Additional Information SS# Monona [place], [place]

## 2018-01-05 NOTE — Progress Notes (Signed)
PHARMACY CONSULT NOTE FOR:  OUTPATIENT  PARENTERAL ANTIBIOTIC THERAPY (OPAT)  Indication: Septic arthritis of symphysis pubis  Regimen: Ceftriaxone 2 gm every 24 hours + Metronidazole 500 mg every 8 hours End date: 02/06/18  IV antibiotic discharge orders are pended. To discharging provider:  please sign these orders via discharge navigator,  Select New Orders & click on the button choice - Manage This Unsigned Work.     Thank you for allowing pharmacy to be a part of this patient's care.  Susa Raring, PharmD, BCPS PGY2 Infectious Diseases Pharmacy Resident Pager: [phone]  01/05/2018, 2:30 PM

## 2018-01-05 NOTE — Progress Notes (Signed)
Palliative care progress note  Reason for consult: Goals of care/symptom management  I met this afternoon with [name].  She reports having good pain control on her current regimen.  Chart review reveals she has had 3 mg of Dilaudid in the last 24 hours in addition to oxycontin 19m BID.  This is roughly equivalent to 150 mg mg of oral morphine in the last 24 hours (100 mg of oral oxycodone).   I talked with her about possibly increasing long acting medication to oxycodone 40 mg twice daily, but she reports being very comfortable and wanting to keep same long-acting regimen for now.  Therefore, we will plan for initiation of oxycodone 10 mg every 4 hours as needed for rescue medication.  I did leave Dilaudid 1 mg IV as a secondary backup to be used 45 minutes after administration of oral medication of oral medication is not effective.  She reports having a bowel movement today.  Continue senna-S 2 tabs twice daily.  Continue MiraLAX as needed.  I am going off service tomorrow, however, I will ask another member of the palliative medicine team to follow and adjust pain medications as appropriate.  Total time: 30 minutes Greater than 50%  of this time was spent counseling and coordinating care related to the above assessment and plan.  GMicheline Rough MD [facility]herwood ManorTeam [phone]

## 2018-01-05 NOTE — Progress Notes (Signed)
Date of Admission:  12/30/2017   Total days of antibiotics 4                                          Day 2 ceftriaxone        Day 4 metronidazole                Recommendations: 1. Continue Ceftriaxone 2 gm every 24 hours  2. Re-start metronidazole 500 mg every 8 hours   Assessment: Yolanda Watson is a 67 yo female who was admitted on 1/01 with abdominal wound and pelvic osteomyelitis. Surgical bone tissue from 1/03 is showing rare group b strep, few viridans, strep and rare diphtheroids. Culture also has a possible anaerobe pending. Patient was narrowed to ceftriaxone yesterday due to culture results. We will add back metronidazole today due to the possibility of an anaerobe on culture and location of wound.    Past Medical History:  Diagnosis Date  . Acquired bilateral hand deformity    both hads with severe digit contnracture. VERY limited ROM   . Alcohol abuse   . Colon cancer ([place]) 2016   metastatic to abdominal wall  . Port-A-Cath in place since 04-2017   right chest     Social History   Tobacco Use  . Smoking status: Current Every Day Smoker    Packs/day: 1.00    Years: 45.00    Pack years: 45.00    Types: Cigarettes  . Smokeless tobacco: Never Used  Substance Use Topics  . Alcohol use: Yes    Alcohol/week: 1.2 - 2.4 oz    Types: 2 - 4 Shots of liquor per week    Comment: denies problem with ETOH but contineus to drink "a couple of drinks per day"  . Drug use: No    Family History  Problem Relation Age of Onset  . Cancer Neg Hx    Allergies  Allergen Reactions  . Penicillins Other (See Comments)    Tolerated cefepime 01/01/2018 "PASSED OUT AS A CHILD" > ? SYNCOPE ? PATIENT HAS HAD A PCN REACTION WITH IMMEDIATE RASH, FACIAL/TONGUE/THROAT SWELLING, SOB, OR LIGHTHEADEDNESS WITH HYPOTENSION:  #  #  #  YES  #  #  #   Has patient had a PCN reaction causing severe rash involving mucus membranes or skin necrosis: No Has patient had a PCN reaction that required  hospitalization: No Has patient had a PCN reaction occurring within the last 10 years: No   . Latex Itching and Rash    BED "CHUX PADS"    OBJECTIVE: Blood pressure (!) 102/53, pulse 86, temperature 99.1 F (37.3 C), temperature source Oral, resp. rate 18, height 5\' 6"  (1.676 m), weight 114 lb 3.2 oz (51.8 kg), SpO2 93 %.  Lab Results Lab Results  Component Value Date   WBC 10.1 01/02/2018   HGB 8.2 (L) 01/02/2018   HCT 27.4 (L) 01/02/2018   MCV 87.3 01/02/2018   PLT 386 01/02/2018    Lab Results  Component Value Date   CREATININE 0.57 01/05/2018   BUN <5 (L) 01/05/2018   NA 134 (L) 01/05/2018   K 3.7 01/05/2018   CL 98 (L) 01/05/2018   CO2 28 01/05/2018    Lab Results  Component Value Date   ALT 18 12/30/2017   AST 23 12/30/2017   ALKPHOS 128 (H) 12/30/2017  BILITOT 0.5 12/30/2017     Microbiology: Recent Results (from the past 240 hour(s))  Wound or Superficial Culture     Status: None   Collection Time: 12/30/17 10:45 AM  Result Value Ref Range Status   Specimen Description ABSCESS  Final   Special Requests NONE  Final   Gram Stain   Final    MODERATE WBC PRESENT, PREDOMINANTLY PMN ABUNDANT GRAM NEGATIVE RODS ABUNDANT GRAM POSITIVE COCCI IN PAIRS IN CLUSTERS FEW GRAM POSITIVE RODS    Culture NORMAL SKIN FLORA  Final   Report Status 01/03/2018 FINAL  Final  Surgical pcr screen     Status: None   Collection Time: 01/01/18 12:26 AM  Result Value Ref Range Status   MRSA, PCR NEGATIVE NEGATIVE Final   Staphylococcus aureus NEGATIVE NEGATIVE Final    Comment: (NOTE) The Xpert SA Assay (FDA approved for NASAL specimens in patients 77 years of age and older), is one component of a comprehensive surveillance program. It is not intended to diagnose infection nor to guide or monitor treatment.   Aerobic/Anaerobic Culture (surgical/deep wound)     Status: None (Preliminary result)   Collection Time: 01/01/18  7:51 AM  Result Value Ref Range Status   Specimen  Description WOUND ABDOMEN  Final   Special Requests WOUND DEBRIDEMENT SPEC A  Final   Gram Stain   Final    MODERATE WBC PRESENT,BOTH PMN AND MONONUCLEAR ABUNDANT GRAM POSITIVE COCCI IN PAIRS ABUNDANT GRAM NEGATIVE RODS FEW GRAM POSITIVE RODS Results Called to: S. DIXON AT 1610 ON 01/01/18 BY C. JESSUP, MLT.    Culture   Final    RARE GROUP B STREP(S.AGALACTIAE)ISOLATED TESTING AGAINST S. AGALACTIAE NOT ROUTINELY PERFORMED DUE TO PREDICTABILITY OF AMP/PEN/VAN SUSCEPTIBILITY. WITHIN MIXED CULTURE HOLDING FOR POSSIBLE ANAEROBE    Report Status PENDING  Incomplete  Aerobic/Anaerobic Culture (surgical/deep wound)     Status: None (Preliminary result)   Collection Time: 01/01/18  7:56 AM  Result Value Ref Range Status   Specimen Description BONE  Final   Special Requests ABDOMINAL WALL DEBRIDEMENT BONE SPEC B  Final   Gram Stain   Final    FEW WBC PRESENT, PREDOMINANTLY PMN NO ORGANISMS SEEN Results Called to: S. DIXON AT 9604 01/01/18 BY C. JEESUP, MLT.    Culture   Final    RARE GROUP B STREP(S.AGALACTIAE)ISOLATED TESTING AGAINST S. AGALACTIAE NOT ROUTINELY PERFORMED DUE TO PREDICTABILITY OF AMP/PEN/VAN SUSCEPTIBILITY. FEW VIRIDANS STREPTOCOCCUS RARE DIPHTHEROIDS(CORYNEBACTERIUM SPECIES) Standardized susceptibility testing for this organism is not available. HOLDING FOR POSSIBLE ANAEROBE CRITICAL RESULT CALLED TO, READ BACK BY AND VERIFIED WITH: C BAKER,RN AT 1459 01/02/18 BY L BENFIELD CONCERNING GROWTH ON CULTURE    Report Status PENDING  Incomplete   Organism ID, Bacteria VIRIDANS STREPTOCOCCUS  Final      Susceptibility   Viridans streptococcus - MIC*    PENICILLIN <=0.06 SENSITIVE Sensitive     CEFTRIAXONE 0.25 SENSITIVE Sensitive     ERYTHROMYCIN <=0.12 SENSITIVE Sensitive     LEVOFLOXACIN 0.5 SENSITIVE Sensitive     VANCOMYCIN 0.5 SENSITIVE Sensitive     * FEW VIRIDANS STREPTOCOCCUS    Susa Raring, PharmD, BCPS PGY2 Infectious Diseases Pharmacy Resident  [phone]  pager   01/05/2018, 9:58 AM

## 2018-01-06 DIAGNOSIS — Z9189 Other specified personal risk factors, not elsewhere classified: Secondary | ICD-10-CM | POA: Diagnosis not present

## 2018-01-06 DIAGNOSIS — Z483 Aftercare following surgery for neoplasm: Secondary | ICD-10-CM | POA: Diagnosis not present

## 2018-01-06 DIAGNOSIS — Z792 Long term (current) use of antibiotics: Secondary | ICD-10-CM | POA: Diagnosis not present

## 2018-01-06 DIAGNOSIS — B951 Streptococcus, group B, as the cause of diseases classified elsewhere: Secondary | ICD-10-CM | POA: Diagnosis not present

## 2018-01-06 DIAGNOSIS — M86159 Other acute osteomyelitis, unspecified femur: Secondary | ICD-10-CM | POA: Diagnosis not present

## 2018-01-06 DIAGNOSIS — R531 Weakness: Secondary | ICD-10-CM | POA: Diagnosis not present

## 2018-01-06 DIAGNOSIS — T8189XA Other complications of procedures, not elsewhere classified, initial encounter: Secondary | ICD-10-CM | POA: Diagnosis not present

## 2018-01-06 DIAGNOSIS — Z515 Encounter for palliative care: Secondary | ICD-10-CM | POA: Diagnosis not present

## 2018-01-06 DIAGNOSIS — C189 Malignant neoplasm of colon, unspecified: Secondary | ICD-10-CM | POA: Diagnosis not present

## 2018-01-06 DIAGNOSIS — G893 Neoplasm related pain (acute) (chronic): Secondary | ICD-10-CM | POA: Diagnosis not present

## 2018-01-06 DIAGNOSIS — R262 Difficulty in walking, not elsewhere classified: Secondary | ICD-10-CM | POA: Diagnosis not present

## 2018-01-06 DIAGNOSIS — R100 Acute abdomen: Secondary | ICD-10-CM | POA: Diagnosis not present

## 2018-01-06 DIAGNOSIS — R1030 Lower abdominal pain, unspecified: Secondary | ICD-10-CM | POA: Diagnosis not present

## 2018-01-06 DIAGNOSIS — M868X8 Other osteomyelitis, other site: Secondary | ICD-10-CM | POA: Diagnosis not present

## 2018-01-06 DIAGNOSIS — L02211 Cutaneous abscess of abdominal wall: Secondary | ICD-10-CM | POA: Diagnosis not present

## 2018-01-06 DIAGNOSIS — R488 Other symbolic dysfunctions: Secondary | ICD-10-CM | POA: Diagnosis not present

## 2018-01-06 DIAGNOSIS — Z85038 Personal history of other malignant neoplasm of large intestine: Secondary | ICD-10-CM | POA: Diagnosis not present

## 2018-01-06 DIAGNOSIS — M6281 Muscle weakness (generalized): Secondary | ICD-10-CM | POA: Diagnosis not present

## 2018-01-06 DIAGNOSIS — T8149XA Infection following a procedure, other surgical site, initial encounter: Secondary | ICD-10-CM | POA: Diagnosis not present

## 2018-01-06 DIAGNOSIS — M009 Pyogenic arthritis, unspecified: Secondary | ICD-10-CM | POA: Diagnosis not present

## 2018-01-06 DIAGNOSIS — M869 Osteomyelitis, unspecified: Secondary | ICD-10-CM | POA: Diagnosis not present

## 2018-01-06 DIAGNOSIS — T8189XD Other complications of procedures, not elsewhere classified, subsequent encounter: Secondary | ICD-10-CM | POA: Diagnosis not present

## 2018-01-06 LAB — AEROBIC/ANAEROBIC CULTURE W GRAM STAIN (SURGICAL/DEEP WOUND)

## 2018-01-06 LAB — BASIC METABOLIC PANEL
ANION GAP: 11 (ref 5–15)
BUN: 6 mg/dL (ref 6–20)
CO2: 24 mmol/L (ref 22–32)
Calcium: 8.1 mg/dL — ABNORMAL LOW (ref 8.9–10.3)
Chloride: 99 mmol/L — ABNORMAL LOW (ref 101–111)
Creatinine, Ser: 0.6 mg/dL (ref 0.44–1.00)
Glucose, Bld: 98 mg/dL (ref 65–99)
Potassium: 4.2 mmol/L (ref 3.5–5.1)
SODIUM: 134 mmol/L — AB (ref 135–145)

## 2018-01-06 LAB — AEROBIC/ANAEROBIC CULTURE (SURGICAL/DEEP WOUND)

## 2018-01-06 MED ORDER — METRONIDAZOLE 500 MG PO TABS: 500.0000 mg | ORAL_TABLET | Freq: Three times a day (TID) | ORAL | 0 refills | Status: DC

## 2018-01-06 MED ORDER — POLYETHYLENE GLYCOL 3350 17 G PO PACK: 17.0000 g | PACK | Freq: Every day | ORAL | 0 refills | Status: DC | PRN

## 2018-01-06 MED ORDER — SENNOSIDES-DOCUSATE SODIUM 8.6-50 MG PO TABS: 2.0000 | ORAL_TABLET | Freq: Two times a day (BID) | ORAL | 0 refills | Status: DC

## 2018-01-06 MED ORDER — ASCORBIC ACID 500 MG PO TABS: 500.0000 mg | ORAL_TABLET | Freq: Every day | ORAL | 0 refills | Status: DC

## 2018-01-06 MED ORDER — OXYCODONE HCL 10 MG PO TABS: 10.0000 mg | ORAL_TABLET | Freq: Four times a day (QID) | ORAL | 0 refills | Status: DC | PRN

## 2018-01-06 MED ORDER — HEPARIN SOD (PORK) LOCK FLUSH 100 UNIT/ML IV SOLN
500.0000 [IU] | INTRAVENOUS | Status: AC | PRN
  Administered 2018-01-06: 500 [IU]

## 2018-01-06 MED ORDER — ENSURE ENLIVE PO LIQD: 237.0000 mL | Freq: Two times a day (BID) | ORAL | 12 refills | Status: DC

## 2018-01-06 MED ORDER — METHOCARBAMOL 500 MG PO TABS: 500.0000 mg | ORAL_TABLET | Freq: Three times a day (TID) | ORAL | 0 refills | Status: DC

## 2018-01-06 MED ORDER — OXYCODONE HCL ER 30 MG PO T12A: 30.0000 mg | EXTENDED_RELEASE_TABLET | Freq: Two times a day (BID) | ORAL | 0 refills | Status: DC

## 2018-01-06 MED ORDER — CEFTRIAXONE IV (FOR PTA / DISCHARGE USE ONLY): 2.0000 g | INTRAVENOUS | 0 refills | Status: DC

## 2018-01-06 NOTE — Progress Notes (Signed)
Physical Therapy Treatment Patient Details Name: Yolanda Watson MRN: [id number] DOB: 02/19/1951 Today's Date: 01/06/2018    History of Present Illness  67 y.o. female with medical history significant of contractures in B hands 2 "snow globe accident" over 30 years ago, metastatic colon cancer to the abdominal wall s/p recent resection and reconstruction presenting with wound drainage. Dx of osteomyelitis pubic symphysis, s/p I&D 01/01/17.    PT Comments    Pt progressing well this session. Acute PT goals updated to reflect pt's progress. Pt was able to transition to/from bed to use BSC, however was not able to ambulate this session. Pt had significant difficulty advancing feet to take steps, and scooted feet along the floor during pivot transfers. Will continue to follow and progress as able per POC.    Follow Up Recommendations  SNF;Supervision for mobility/OOB     Equipment Recommendations  (TBD by progress with PT at next venue of care)    Recommendations for Other Services       Precautions / Restrictions Precautions Precautions: Fall Precaution Comments: wound vac Restrictions Weight Bearing Restrictions: No    Mobility  Bed Mobility Overal bed mobility: Needs Assistance Bed Mobility: Supine to Sit;Sit to Supine     Supine to sit: Min assist Sit to supine: Min assist   General bed mobility comments: VC's for sequencing. Pt was able to transition to EOB with HOB elevated and with support posteriorly at the shoulders from husband. When transitioning back to bed, pt required min assist for LE elevation back up to the mattress.   Transfers Overall transfer level: Needs assistance Equipment used: 1 person hand held assist Transfers: Sit to/from [facility] Sit to Stand: Min assist;From elevated surface Stand pivot transfers: Mod assist       General transfer comment: PT anterior and pt holding to therapist's arms - pt was able to power-up to full stand  with min assist. Slightly heavier min assist from lower BSC. Mod assist for pivotal steps around to/from [facility]. Pt had difficulty advancing LE's and scooted feet around without clearing the floor due to pain.   Ambulation/Gait             General Gait Details: Unable   Stairs            Wheelchair Mobility    Modified Rankin (Stroke Patients Only)       Balance Overall balance assessment: Needs assistance Sitting-balance support: Feet supported;No upper extremity supported Sitting balance-Leahy Scale: Fair     Standing balance support: Bilateral upper extremity supported;During functional activity Standing balance-Leahy Scale: Poor                              Cognition Arousal/Alertness: Awake/alert Behavior During Therapy: Anxious;WFL for tasks assessed/performed Overall Cognitive Status: Within Functional Limits for tasks assessed                                        Exercises General Exercises - Lower Extremity Long Arc Quad: 10 reps;Both    General Comments        Pertinent Vitals/Pain Pain Assessment: Faces Faces Pain Scale: Hurts little more Pain Location: pelvis Pain Descriptors / Indicators: Sharp Pain Intervention(s): Limited activity within patient's tolerance;Monitored during session;Repositioned    Home Living  Prior Function            PT Goals (current goals can now be found in the care plan section) Acute Rehab PT Goals Patient Stated Goal: to walk, D/C home PT Goal Formulation: With patient/family Time For Goal Achievement: 01/16/18 Potential to Achieve Goals: Good Progress towards PT goals: Goals met and updated - see care plan    Frequency    Min 3X/week      PT Plan Current plan remains appropriate    Co-evaluation              AM-PAC PT "6 Clicks" Daily Activity  Outcome Measure  Difficulty turning over in bed (including adjusting bedclothes,  sheets and blankets)?: Unable Difficulty moving from lying on back to sitting on the side of the bed? : Unable Difficulty sitting down on and standing up from a chair with arms (e.g., wheelchair, bedside commode, etc,.)?: Unable Help needed moving to and from a bed to chair (including a wheelchair)?: A Little Help needed walking in hospital room?: A Lot Help needed climbing 3-5 steps with a railing? : Total 6 Click Score: 9    End of Session Equipment Utilized During Treatment: Gait belt Activity Tolerance: Patient limited by pain(and anxiety) Patient left: in bed;with call bell/phone within reach;with family/visitor present Nurse Communication: Mobility status PT Visit Diagnosis: Muscle weakness (generalized) (M62.81);Difficulty in walking, not elsewhere classified (R26.2);Pain Pain - Right/Left: (central) Pain - part of body: (Abdomen and pubic symphysis (inner thigh pain))     Time: 0638-6854 PT Time Calculation (min) (ACUTE ONLY): 40 min  Charges:  $Therapeutic Activity: 38-52 mins                    G Codes:       Rolinda Roan, PT, DPT Acute Rehabilitation Services Pager: [phone]    Thelma Comp 01/06/2018, 1:35 PM

## 2018-01-06 NOTE — Clinical Social Work Note (Signed)
Clinical Social Work Assessment  Patient Details  Name: Yolanda Watson MRN: [id number] Date of Birth: [birth year]  Date of referral:  01/06/18               Reason for consult:  Facility Placement                Permission sought to share information with:  Facility Sport and exercise psychologist, Family Supports Permission granted to share information::  Yes, Verbal Permission Granted  Name::     Aleesha Ringstad  Relationship::  husband  Contact Information:    [phone]  Housing/Transportation Living arrangements for the past 2 months:  Apartment Source of Information:  Patient, Spouse Patient Interpreter Needed:  None Criminal Activity/Legal Involvement Pertinent to Current Situation/Hospitalization:  No - Comment as needed Significant Relationships:  Adult Children Lives with:  Spouse Do you feel safe going back to the place where you live?  Yes Need for family participation in patient care:  Yes (Comment)(iv antibiotics and mobility)  Care giving concerns:  Pt is chronically ill, and requires assistance with mobility. Pt lives in an apartment that has stairs up and down to access the apartment. Due to contractures in pt hands, pt requires assistance with iv antibiotics.  SNF recommended for assistance with antibiotics and further therapy support.    Social Worker assessment / plan:  CSW met with pt and pt husband at bedside. Pt had initially expressed desire to go home but is aware that SNF suggestions may be appropriate. Pt states she lives in an apartment with her husband. The apartment has stairs up and then multiple stairs down in order to access their home. Pt has been chronically ill with cancer of the colon and has a wound on the abdomen. Pt expresses that she would like to get stronger. Pt husband is available to help, as well as an adult son who works but stops by and helps when possible. CSW will continue to follow to support discharge to SNF.    Employment status:    Retired Forensic scientist:  Medicare PT Recommendations:  [place] / Referral to community resources:  [place]  Patient/Family's Response to care:  Pt and pt husband are both accepting of SNF recommendation but express that they are eager to have pt home. Pt and pt husband understand and are grateful for CSW support.   Patient/Family's Understanding of and Emotional Response to Diagnosis, Current Treatment, and Prognosis:  Pt and pt family are extremely pleasant and positive. They understand pt diagnosis and current treatment plan. They are aware of prognosis and despite being chronically ill pt is chatty and optimistic. Pt and pt husband are amenable and accepting of SNF recommendation and CSW support.  Emotional Assessment Appearance:  Appears stated age Attitude/Demeanor/Rapport:  (pleasant; accepting) Affect (typically observed):  Accepting, Adaptable, Pleasant, Appropriate Orientation:  Oriented to Self, Oriented to Place, Oriented to  Time, Oriented to Situation Alcohol / Substance use:  Tobacco Use, Alcohol Use(daily smoker; admits to ETOH daily) Psych involvement (Current and /or in the community):  No (Comment)  Discharge Needs  Concerns to be addressed:  Discharge Planning Concerns, Care Coordination Readmission within the last 30 days:  Yes Current discharge risk:  Physical Impairment, Chronically ill, Dependent with Mobility Barriers to Discharge:  Continued Medical Work up   [employer], [place] 01/06/2018, 11:01 AM

## 2018-01-06 NOTE — Social Work (Signed)
Clinical Social Worker facilitated patient discharge including contacting patient family and facility to confirm patient discharge plans.  Clinical information faxed to facility and family agreeable with plan.  CSW arranged ambulance transport via [place] to [facility] and [place] [address]. RN to call [phone] report  prior to discharge.  Clinical Social Worker will sign off for now as social work intervention is no longer needed. Please consult [place] again if new need arises.  Alexander Mt, [place] Social Worker

## 2018-01-06 NOTE — Progress Notes (Signed)
  POD# 5 debridement abdominal wall, pubic bone  Temp:  [98.2 F (36.8 C)-98.4 F (36.9 C)] 98.3 F (36.8 C) (01/08 0400) Pulse Rate:  [81-89] 89 (01/08 0400) Resp:  [17-20] 18 (01/08 0400) BP: (106-109)/(56-65) 107/56 (01/08 0400) SpO2:  [95 %-98 %] 98 % (01/08 0400)   Notes reviewed. If patient to discharge today, may clamp sponge dressing of VAC for transfer and reconnect to facility [facility] or home VAC.  Recommend LAL mattress at SNF.  Continue wound care orders as current: Change VAC dressing Q M/W/F.  Apply one piece of foam to tunneling area, then Mepitel contact layer over wound bed, then black foam, 125 mm Hg suction continuous.  WBAT.  F/u with myself in 7-10 days.   [name], MD [facility] [phone], pin [phone]

## 2018-01-06 NOTE — Care Management Important Message (Signed)
Important Message  Patient Details  Name: Yolanda Watson MRN: [id number] Date of Birth: [birth year]   Medicare Important Message Given:  Yes    [place] Mccutchen 01/06/2018, 1:02 PM

## 2018-01-06 NOTE — Progress Notes (Signed)
Palliative care progress note  Reason for consult: Goals of care/symptom management  I met this afternoon with Yolanda Watson.  She reports having good pain control on her current regimen. She participated well with PT, she is awaiting D/C to rehab soon, she ultimately would like to be home after rehab.   Chart reviewed and pain medication use reviewed. Continue current long-acting regimen and oxycodone 10 mg every 4 hours as needed for rescue medication.   Bowel regimen: Continue senna-S 2 tabs twice daily.  Continue MiraLAX as needed.  I am going of Total time: 15 minutes Greater than 50%  of this time was spent counseling and coordinating care related to the above assessment and plan.  Loistine Chance, MD [facility] [facility] [phone]

## 2018-01-06 NOTE — Social Work (Addendum)
CSW met with pt and pt husband at bedside. Pt more amenable to SNF, states "I need some more therapy and then I can go home." Pt and pt husband select a bed at [facility].   [facility] has been notified and are able to accept pt, discharge today per MD.   CSW continuing to follow to support discharge.   Alexander Mt, [place] Work [phone]

## 2018-01-06 NOTE — Clinical Social Work Placement (Signed)
   CLINICAL SOCIAL WORK PLACEMENT  NOTE [facility]  Date:  01/06/2018  Patient Details  Name: Yolanda Watson MRN: [id number] Date of Birth: [birth year]  Clinical Social Work is seeking post-discharge placement for this patient at the [place] level of care (*CSW will initial, date and re-position this form in  chart as items are completed):  Yes   Patient/family provided with [place] Work Department's list of facilities offering this level of care within the geographic area requested by the patient (or if unable, by the patient's family).  Yes   Patient/family informed of their freedom to choose among providers that offer the needed level of care, that participate in Medicare, Medicaid or managed care program needed by the patient, have an available bed and are willing to accept the patient.  Yes   Patient/family informed of [facility]'s ownership interest in [facility] and [facility], as well as of the fact that they are under no obligation to receive care at these facilities.  PASRR submitted to EDS on       PASRR number received on 01/05/18     Existing PASRR number confirmed on       FL2 transmitted to all facilities in geographic area requested by pt/family on 01/05/18     FL2 transmitted to all facilities within larger geographic area on       Patient informed that his/her managed care company has contracts with or will negotiate with certain facilities, including the following:        Yes   Patient/family informed of bed offers received.  Patient chooses bed at [place] recommends and patient chooses bed at      Patient to be transferred to [facility] on 01/06/18.  Patient to be transferred to facility by [facility]     Patient family notified on 01/06/18 of transfer.  Name of family member notified:  Geovana Gebel, husband     PHYSICIAN Please prepare priority  discharge summary, including medications     Additional Comment:    _______________________________________________ Alexander Mt, LCSWA 01/06/2018, 2:57 PM

## 2018-01-06 NOTE — Discharge Instructions (Signed)
Abdominal wound care: Change VAC dressing Q M/W/F.  Apply one piece of foam to tunneling area, then Mepitel contact layer over wound bed, then black foam. Set to 125 mm Hg continuous.

## 2018-01-06 NOTE — Discharge Summary (Signed)
[facility] Discharge Summary   Patient: Yolanda Watson PPI:[id number]   PCP: Patient, No Pcp Per DOB: [birth year]   Date of admission: 12/30/2017   Date of discharge:  01/06/2018    Discharge Diagnoses:  Principal Problem:   Acute osteomyelitis of pelvic region [facility]) Active Problems:   Cancer of right colon ([place])   Open wound of abdomen   Abdominal wall abscess at site of surgical wound   Abdominal pain, lower   Encounter for palliative care   Admitted From: home Disposition:  SNF  Recommendations for Outpatient Follow-up:  1. Please follow up with PCP in 1 week    Contact information for follow-up providers    PCP. Schedule an appointment as soon as possible for a visit in 1 week(s).        [name], MD. Schedule an appointment as soon as possible for a visit in 2 week(s).   Specialty:  Plastic Surgery Contact information: [address] [place] [address] [phone]        Ladell Pier, MD. Go on 01/27/2018.   Specialty:  Oncology Contact information: [place] [address] [phone]            Contact information for after-discharge care    Destination    [facility] Follow up.   Service:  Skilled Nursing Contact information: [address] [phone]                 Diet recommendation: regular diet  Activity: The patient is advised to gradually reintroduce usual activities.  Discharge Condition: good  Code Status: DNR DNI  History of present illness: As per the H and P dictated on admission, "Yolanda Watson is a 67 y.o. female with medical history significant of metastatic colon cancer to the abdominal wall s/p recent resection and reconstruction presenting with wound drainage.  She reports that she developed severe pain in her inner thighs and pelvis region, she was unable to walk.  It started Saturday night and Sunday  morning.  She has had severe leg pain in the past, but nothing like this.  She was last able to walk on Saturday AM.  "The wound itself is fine but the hole around it is not - it is bad, smelly, everything".  She was started on Dakin's about a week ago but this is helping, she is "draining all the time and I can't live like that".  Her cancer was diagnosed in 2016 and she had the mass removed and a colectomy.  She had a later reversal and they thought they had gotten everything.  The mass came back and it was growing outward rather than inward.  She completed chemotherapy and radiation in 2018 and was referred to plastics to have the mass removed.  She believes that she is cancer free at this time."  Hospital Course:  Summary of her active problems in the hospital is as following. Osteomyelitis/Septic arthritis of pubic symphysis Abdominal wound Patient follows with general and plastic surgery as an outpatient. No elevated WBC and afebrile. Stable.  Lasix surgery was consulted patient underwent debridement on 01/01/2018 tolerated very well. Chief from the surgery is growing group B strep. Infectious disease was consulted initially the patient was on broad-spectrum antibiotics. Based on the culture the patient will be on IV ceftriaxone and Flagyl on discharge for 1 month. Patient will follow up with surgery as an outpatient.  As well as infectious disease.  History of metastatic colon cancer S/p FOLFOX x5. Pathology (10/03/17) positive for adenocarcinoma. -Plastic surgery recommendations  Chronic pain management due to cancer. Patient has chronic malignancy causing severe abdominal pain. Palliative care consulted for goals of care as well as pain management. Pain medication have been adjusted and prescriptions are provided on discharge. Further medication will be provided by patient's PCP or oncology.  All other chronic medical condition were stable during the hospitalization.  Patient was  ambulatory without any assistance. On the day of the discharge the patient's vitals were stable, and no other acute medical condition were reported by patient. the patient was felt safe to be discharge at SNF with therapy.  Procedures and Results:  Excisional debridement of skin subcutaneous tissue and bone.  Wound VAC placement.  Consultations:  Plastic surgery.  Palliative care.  Wound care.  Orthopedic surgery.  Infectious disease.  DISCHARGE MEDICATION: Allergies as of 01/06/2018      Reactions   Penicillins Other (See Comments)   Tolerated cefepime 01/01/2018 "PASSED OUT AS A CHILD" > ? SYNCOPE ? PATIENT HAS HAD A PCN REACTION WITH IMMEDIATE RASH, FACIAL/TONGUE/THROAT SWELLING, SOB, OR LIGHTHEADEDNESS WITH HYPOTENSION:  #  #  #  YES  #  #  #   Has patient had a PCN reaction causing severe rash involving mucus membranes or skin necrosis: No Has patient had a PCN reaction that required hospitalization: No Has patient had a PCN reaction occurring within the last 10 years: No   Latex Itching, Rash   BED "CHUX PADS"      Medication List    STOP taking these medications   naproxen sodium 220 MG tablet Commonly known as:  ALEVE   oxyCODONE 5 MG immediate release tablet Commonly known as:  Oxy IR/ROXICODONE Replaced by:  oxyCODONE 30 MG 12 hr tablet     TAKE these medications   ascorbic acid 500 MG tablet Commonly known as:  VITAMIN C Take 1 tablet (500 mg total) by mouth daily. Start taking on:  01/07/2018   cefTRIAXone IVPB Commonly known as:  ROCEPHIN Inject 2 g into the vein daily. Indication:  Abdominal wound/ septic arthritis of pubic symphysis  Last Day of Therapy:  02/06/2018 Labs - Once weekly:  CBC/D and BMP, Labs - Every other week:  ESR and CRP   feeding supplement (ENSURE ENLIVE) Liqd Take 237 mLs by mouth 2 (two) times daily between meals.   methocarbamol 500 MG tablet Commonly known as:  ROBAXIN Take 1 tablet (500 mg total) by mouth 3 (three) times  daily.   metroNIDAZOLE 500 MG tablet Commonly known as:  FLAGYL Take 1 tablet (500 mg total) by mouth 3 (three) times daily.   oxyCODONE 30 MG 12 hr tablet Take 1 tablet (30 mg total) by mouth every 12 (twelve) hours. Replaces:  oxyCODONE 5 MG immediate release tablet   Oxycodone HCl 10 MG Tabs Take 1 tablet (10 mg total) by mouth every 6 (six) hours as needed for breakthrough pain.   polyethylene glycol packet Commonly known as:  MIRALAX / GLYCOLAX Take 17 g by mouth daily as needed for moderate constipation.   senna-docusate 8.6-50 MG tablet Commonly known as:  Senokot-S Take 2 tablets by mouth 2 (two) times daily.            Home Infusion Instuctions  (From admission, onward)        Start     Ordered   01/06/18 0000  Home infusion  instructions [facility] May follow [place] Dosing Protocol; May administer Cathflo as needed to maintain patency of vascular access device.; Flushing of vascular access device: per [facility] Protocol: 0.9% NaCl pre/post medica...    Question Answer Comment  Instructions May follow [place] Dosing Protocol   Instructions May administer Cathflo as needed to maintain patency of vascular access device.   Instructions Flushing of vascular access device: per [facility] Protocol: 0.9% NaCl pre/post medication administration and prn patency; Heparin 100 u/ml, 25m for implanted ports and Heparin 10u/ml, 591mfor all other central venous catheters.   Instructions May follow [facility] Anaphylaxis Protocol for First Dose Administration in the home: 0.9% NaCl at 25-50 ml/hr to maintain IV access for protocol meds. Epinephrine 0.3 ml IV/IM PRN and Benadryl 25-50 IV/IM PRN s/s of anaphylaxis.   Instructions [facility] Infusion Coordinator (RN) to assist per patient IV care needs in the home PRN.      01/06/18 1240       Discharge Care Instructions  (From admission, onward)        Start     Ordered   01/06/18 0000  Discharge wound care:      Comments:  Continue VAC change three times per week   01/06/18 1242     Allergies  Allergen Reactions  . Penicillins Other (See Comments)    Tolerated cefepime 01/01/2018 "PASSED OUT AS A CHILD" > ? SYNCOPE ? PATIENT HAS HAD A PCN REACTION WITH IMMEDIATE RASH, FACIAL/TONGUE/THROAT SWELLING, SOB, OR LIGHTHEADEDNESS WITH HYPOTENSION:  #  #  #  YES  #  #  #   Has patient had a PCN reaction causing severe rash involving mucus membranes or skin necrosis: No Has patient had a PCN reaction that required hospitalization: No Has patient had a PCN reaction occurring within the last 10 years: No   . Latex Itching and Rash    BED "CHUX PADS"   Discharge Instructions    Diet general   Complete by:  As directed    Discharge instructions   Complete by:  As directed    It is important that you read following instructions as well as go over your medication list with RN to help you understand your care after this hospitalization.  Discharge Instructions: Please follow-up with PCP in one week  Please request your primary care physician to go over all Hospital Tests and Procedure/Radiological results at the follow up,  Please get all Hospital records sent to your PCP by signing hospital release before you go home.   Do not drive, operating heavy machinery, perform activities at heights, swimming or participation in water activities or provide baby sitting services while you are on Pain, Sleep and Anxiety Medications; until you have been seen by Primary Care Physician or a Neurologist and advised to do so again. Do not take more than prescribed Pain, Sleep and Anxiety Medications. You were cared for by a hospitalist during your hospital stay. If you have any questions about your discharge medications or the care you received while you were in the hospital after you are discharged, you can call the unit and ask to speak with the hospitalist on call if the hospitalist that took care of you is not available.   Once you are discharged, your primary care physician will handle any further medical issues. Please note that NO REFILLS for any discharge medications will be authorized once you are discharged, as it is imperative that you return to your primary  care physician (or establish a relationship with a primary care physician if you do not have one) for your aftercare needs so that they can reassess your need for medications and monitor your lab values. You Must read complete instructions/literature along with all the possible adverse reactions/side effects for all the Medicines you take and that have been prescribed to you. Take any new Medicines after you have completely understood and accept all the possible adverse reactions/side effects. Wear Seat belts while driving. If you have smoked or chewed Tobacco in the last 2 yrs please stop smoking and/or stop any Recreational drug use.   Discharge wound care:   Complete by:  As directed    Continue VAC change three times per week   Home infusion instructions [facility] May follow [place] Dosing Protocol; May administer Cathflo as needed to maintain patency of vascular access device.; Flushing of vascular access device: per [facility] Protocol: 0.9% NaCl pre/post medica...   Complete by:  As directed    Instructions:  May follow [place] Dosing Protocol   Instructions:  May administer Cathflo as needed to maintain patency of vascular access device.   Instructions:  Flushing of vascular access device: per [facility] Protocol: 0.9% NaCl pre/post medication administration and prn patency; Heparin 100 u/ml, 40m for implanted ports and Heparin 10u/ml, 564mfor all other central venous catheters.   Instructions:  May follow [facility] Anaphylaxis Protocol for First Dose Administration in the home: 0.9% NaCl at 25-50 ml/hr to maintain IV access for protocol meds. Epinephrine 0.3 ml IV/IM PRN and Benadryl 25-50 IV/IM PRN s/s of anaphylaxis.   Instructions:  [facility] (RN) to assist per patient IV care needs in the home PRN.   Increase activity slowly   Complete by:  As directed      Discharge Exam: Filed Weights   12/30/17 0752 12/30/17 1719  Weight: 49.9 kg (110 lb) 51.8 kg (114 lb 3.2 oz)   Vitals:   01/06/18 0400 01/06/18 1359  BP: (!) 107/56 (!) 107/94  Pulse: 89 98  Resp: 18   Temp: 98.3 F (36.8 C) 98.3 F (36.8 C)  SpO2: 98% 100%   General: Appear in no distress, no Rash; Oral Mucosa moist. Cardiovascular: S1 and S2 Present, no Murmur, no JVD Respiratory: Bilateral Air entry present and Clear to Auscultation, no Crackles, no wheezes Abdomen: Bowel Sound present, Soft and no tenderness Extremities: no Pedal edema, no calf tenderness Neurology: Grossly no focal neuro deficit.  The results of significant diagnostics from this hospitalization (including imaging, microbiology, ancillary and laboratory) are listed below for reference.    Significant Diagnostic Studies: Ct Abdomen Pelvis W Contrast  Result Date: 12/30/2017 CLINICAL DATA:  Lower abdominal pain. History of prior surgery to remove a large anterior pelvic mass. EXAM: CT ABDOMEN AND PELVIS WITH CONTRAST TECHNIQUE: Multidetector CT imaging of the abdomen and pelvis was performed using the standard protocol following bolus administration of intravenous contrast. CONTRAST:  10037mSOVUE-300 IOPAMIDOL (ISOVUE-300) INJECTION 61% COMPARISON:  CT scan 07/21/2017 FINDINGS: Lower chest: The lung bases are clear except for dependent bibasilar atelectasis. The heart is normal in size. No pericardial effusion. Hepatobiliary: Small scattered calcified granulomas. No worrisome hepatic lesions or intrahepatic biliary dilatation. The gallbladder is normal. No common bile duct dilatation. Pancreas: No mass, inflammation or ductal dilatation. Spleen: Numerous calcified granulomas. Adrenals/Urinary Tract: The adrenal glands and kidneys are unremarkable. Moderate anterior wall  thickening of the bladder likely due to the overlying inflammation/infection.  Stomach/Bowel: The stomach, duodenum, small bowel and colon are grossly normal in the abdomen. In the pelvis there are small bowel loops right up underneath the anterior abdominal wall with there is a large soft tissue defect from prior surgery. I do not see any definite leaking oral contrast but findings suspicious for small bowel fistula. There is a tract of air in fluid in the anterior pelvic wall in the midline and extending into the right inguinal area. There is also air and fluid in the anterior abdominal wound. Vascular/Lymphatic: Stable atherosclerotic calcifications involving the aorta and branch vessels. No aneurysm or dissection. The major venous structures are patent. Numerous mesenteric and retroperitoneal lymph nodes likely inflammatory/hyperplastic. Reproductive: The uterus is normal. Moderate retroversion. There is a stable left ovarian dermoid measuring 3.7 cm. Other: No free intrapelvic fluid collections Musculoskeletal: CT findings consistent with septic arthritis involving the pubic symphysis without obvious destructive bony changes but most likely associated osteomyelitis. IMPRESSION: 1. Anterior abdominal wound containing fluid and air along with a probable sinus track extending to the right inguinal area. Findings suspicious for a small bowel fistula although I do not see any obvious leaking oral contrast. 2. Septic arthritis involving the pubic symphysis. 3. Moderate wall thickening of the anterior bladder likely due to the adjacent inflammatory process. 4. Left ovarian dermoid. Electronically Signed   By: Marijo Sanes M.D.   On: 12/30/2017 14:00   Dg Pelvis Portable  Result Date: 01/01/2018 CLINICAL DATA:  Acute osteomyelitis of pelvic region EXAM: PORTABLE PELVIS 1-2 VIEWS COMPARISON:  CT and plain film 12/30/2017 FINDINGS: There appears to be mild widening of the pubic symphysis with compared to prior plain  film. Also, the underlying cortex of the pubic symphysis appears indistinct. Findings likely related to septic arthritis of the pubic symphysis with early changes of adjacent osteomyelitis. IMPRESSION: Slight interval widening of the pubic symphysis with loss of underlying cortex, likely related to septic arthritis and early osteomyelitis. This could be further evaluated with MRI if felt clinically indicated. Electronically Signed   By: Rolm Baptise M.D.   On: 01/01/2018 11:21   Dg Hip Unilat W Or Wo Pelvis 2-3 Views Right  Result Date: 12/30/2017 CLINICAL DATA:  Right hip pain, no known injury, initial encounter EXAM: DG HIP (WITH OR WITHOUT PELVIS) 2-3V RIGHT COMPARISON:  None. FINDINGS: There is no evidence of hip fracture or dislocation. There is no evidence of arthropathy or other focal bone abnormality. IMPRESSION: No acute abnormality noted. Electronically Signed   By: Inez Catalina M.D.   On: 12/30/2017 10:22   Dg Femur Min 2 Views Right  Result Date: 12/30/2017 CLINICAL DATA:  Right leg pain, no known injury, initial encounter EXAM: RIGHT FEMUR 2 VIEWS COMPARISON:  None. FINDINGS: There is no evidence of fracture or other focal bone lesions. Soft tissues are unremarkable. IMPRESSION: No acute abnormality noted. Electronically Signed   By: Inez Catalina M.D.   On: 12/30/2017 10:27    Microbiology: Recent Results (from the past 240 hour(s))  Wound or Superficial Culture     Status: None   Collection Time: 12/30/17 10:45 AM  Result Value Ref Range Status   Specimen Description ABSCESS  Final   Special Requests NONE  Final   Gram Stain   Final    MODERATE WBC PRESENT, PREDOMINANTLY PMN ABUNDANT GRAM NEGATIVE RODS ABUNDANT GRAM POSITIVE COCCI IN PAIRS IN CLUSTERS FEW GRAM POSITIVE RODS    Culture NORMAL SKIN FLORA  Final   Report Status 01/03/2018 FINAL  Final  Surgical pcr screen     Status: None   Collection Time: 01/01/18 12:26 AM  Result Value Ref Range Status   MRSA, PCR NEGATIVE  NEGATIVE Final   Staphylococcus aureus NEGATIVE NEGATIVE Final    Comment: (NOTE) The Xpert SA Assay (FDA approved for NASAL specimens in patients 22 years of age and older), is one component of a comprehensive surveillance program. It is not intended to diagnose infection nor to guide or monitor treatment.   Aerobic/Anaerobic Culture (surgical/deep wound)     Status: None   Collection Time: 01/01/18  7:51 AM  Result Value Ref Range Status   Specimen Description WOUND ABDOMEN  Final   Special Requests WOUND DEBRIDEMENT SPEC A  Final   Gram Stain   Final    MODERATE WBC PRESENT,BOTH PMN AND MONONUCLEAR ABUNDANT GRAM POSITIVE COCCI IN PAIRS ABUNDANT GRAM NEGATIVE RODS FEW GRAM POSITIVE RODS Results Called to: S. DIXON AT 4166 ON 01/01/18 BY C. JESSUP, MLT.    Culture   Final    RARE GROUP B STREP(S.AGALACTIAE)ISOLATED TESTING AGAINST S. AGALACTIAE NOT ROUTINELY PERFORMED DUE TO PREDICTABILITY OF AMP/PEN/VAN SUSCEPTIBILITY. WITHIN MIXED CULTURE MIXED ANAEROBIC FLORA PRESENT.  CALL LAB IF FURTHER IID REQUIRED.    Report Status 01/06/2018 FINAL  Final  Aerobic/Anaerobic Culture (surgical/deep wound)     Status: None   Collection Time: 01/01/18  7:56 AM  Result Value Ref Range Status   Specimen Description BONE  Final   Special Requests ABDOMINAL WALL DEBRIDEMENT BONE SPEC B  Final   Gram Stain   Final    FEW WBC PRESENT, PREDOMINANTLY PMN NO ORGANISMS SEEN Results Called to: S. DIXON AT 1020 01/01/18 BY C. JEESUP, MLT.    Culture   Final    RARE GROUP B STREP(S.AGALACTIAE)ISOLATED TESTING AGAINST S. AGALACTIAE NOT ROUTINELY PERFORMED DUE TO PREDICTABILITY OF AMP/PEN/VAN SUSCEPTIBILITY. FEW VIRIDANS STREPTOCOCCUS RARE DIPHTHEROIDS(CORYNEBACTERIUM SPECIES) Standardized susceptibility testing for this organism is not available. BACTEROIDES SPECIES BETA LACTAMASE POSITIVE CRITICAL RESULT CALLED TO, READ BACK BY AND VERIFIED WITH: C BAKER,RN AT 1459 01/02/18 BY L BENFIELD CONCERNING  GROWTH ON CULTURE    Report Status 01/06/2018 FINAL  Final   Organism ID, Bacteria VIRIDANS STREPTOCOCCUS  Final      Susceptibility   Viridans streptococcus - MIC*    PENICILLIN <=0.06 SENSITIVE Sensitive     CEFTRIAXONE 0.25 SENSITIVE Sensitive     ERYTHROMYCIN <=0.12 SENSITIVE Sensitive     LEVOFLOXACIN 0.5 SENSITIVE Sensitive     VANCOMYCIN 0.5 SENSITIVE Sensitive     * FEW VIRIDANS STREPTOCOCCUS     Labs: CBC: Recent Labs  Lab 12/31/17 0517 01/01/18 0440 01/02/18 0352  WBC 9.3 9.9 10.1  NEUTROABS  --   --  7.1  HGB 8.6* 8.8* 8.2*  HCT 27.4* 28.7* 27.4*  MCV 89.0 88.3 87.3  PLT 330 367 063   Basic Metabolic Panel: Recent Labs  Lab 01/02/18 0352 01/03/18 0337 01/04/18 0331 01/05/18 0419 01/06/18 0441  NA 137 136 131* 134* 134*  K 3.8 3.9 3.8 3.7 4.2  CL 102 100* 99* 98* 99*  CO2 '30 30 29 28 24  '$ GLUCOSE 118* 107* 119* 98 98  BUN 8 6 <5* <5* 6  CREATININE 0.53 0.53 0.48 0.57 0.60  CALCIUM 7.8* 7.7* 8.0* 8.1* 8.1*  MG 1.6*  --   --   --   --    Signed:  Berle Mull  [facility]  01/06/2018  , 3:02 PM

## 2018-01-06 NOTE — Progress Notes (Addendum)
1600 Report given to [facility] at [facility] center. Per [facility] Wound RN, they want the wound vac dressing off and place a wet to dry dressing upon discharge.  [place] waiting for [facility] to transport.  1900 [facility] came, discharged pt to [facility].

## 2018-01-07 ENCOUNTER — Non-Acute Institutional Stay (SKILLED_NURSING_FACILITY): Payer: Medicare Other | Admitting: Adult Health

## 2018-01-07 ENCOUNTER — Other Ambulatory Visit: Payer: Self-pay

## 2018-01-07 ENCOUNTER — Encounter: Payer: Self-pay | Admitting: Adult Health

## 2018-01-07 DIAGNOSIS — G893 Neoplasm related pain (acute) (chronic): Secondary | ICD-10-CM

## 2018-01-07 DIAGNOSIS — R531 Weakness: Secondary | ICD-10-CM

## 2018-01-07 DIAGNOSIS — Z85038 Personal history of other malignant neoplasm of large intestine: Secondary | ICD-10-CM | POA: Diagnosis not present

## 2018-01-07 DIAGNOSIS — M86159 Other acute osteomyelitis, unspecified femur: Secondary | ICD-10-CM

## 2018-01-07 LAB — BASIC METABOLIC PANEL
BUN: 7 (ref 4–21)
CREATININE: 0.4 — AB (ref 0.5–1.1)
GLUCOSE: 100
Potassium: 4.9 (ref 3.4–5.3)
Sodium: 136 — AB (ref 137–147)

## 2018-01-07 LAB — CBC AND DIFFERENTIAL
HCT: 31 — AB (ref 36–46)
HEMOGLOBIN: 9.7 — AB (ref 12.0–16.0)
Neutrophils Absolute: 13
PLATELETS: 498 — AB (ref 150–399)
WBC: 14.8

## 2018-01-07 MED ORDER — OXYCODONE HCL ER 30 MG PO T12A: 30.0000 mg | EXTENDED_RELEASE_TABLET | Freq: Two times a day (BID) | ORAL | 0 refills | Status: DC

## 2018-01-07 NOTE — Telephone Encounter (Signed)
Hard script written by NP.  CMA faxed to pharmacy and put in pharmacy tote.  Notified.

## 2018-01-07 NOTE — Progress Notes (Signed)
Location:  [facility] Room Number: 219-A Place of Service:  SNF (31) Provider:  Durenda Age, NP  Patient Care Team: Patient, No Pcp Per as PCP - General (General Practice)  Extended Emergency Contact Information Primary Emergency Contact: Efrain Sella [place] Mobile Phone: [phone] Relation: Spouse  Code Status:  DNR   Goals of care: Advanced Directive information Advanced Directives 12/30/2017  Does Patient Have a Medical Advance Directive? No  Would patient like information on creating a medical advance directive? No - Patient declined     Chief Complaint  Patient presents with  . Acute Visit    Hospital followup    HPI:  Pt is a 67 y.o. female seen today for hospital followup.  She was admitted to [facility] 1/1-01/06/18 for osteomyelitis of her pelvic region. She was admitted to [facility] on 01/06/18 for short-term rehabilitation.  She has a PMH of metastatic colon cancer, acquired bilateral hand deformity, and alcohol abuse. She was having severe pain in her inner thighs and pelvis region causing her not to walk. Her abdominal wound was "smelly." She had wound debridement on 01/01/18 and culture grew group B strep. Infectious disease was consulted and recommended IV Ceftriaxone and Flagyl X 1 month. She was diagnosed with cancer in 2016 and had a mass removed and a colectomy. She had reversal later but the mass came back and grew outward than inward. She has completed chemotherapy and radiation in 2018 and was referred to plastics to have the mass removed. She believes that she is cancer free. She was seen in the room with her husband at bedside. She denies pain and reports only having pain when she moves.     Past Medical History:  Diagnosis Date  . Acquired bilateral hand deformity    both hads with severe digit contnracture. VERY limited ROM   . Alcohol abuse   . Colon cancer ([place]) 2016   metastatic to  abdominal wall  . Port-A-Cath in place since 04-2017   right chest    Past Surgical History:  Procedure Laterality Date  . ABDOMINAL SURGERY  2016  . ABDOMINOPLASTY N/A 10/03/2017   Procedure: ABDOMINAL WALL RECONSTRUCTION WITH STATTICE;  Surgeon: [name], MD;  Location: [facility];  Service: Plastics;  Laterality: N/A;  . DEBRIDEMENT AND CLOSURE WOUND N/A 01/01/2018   Procedure: DEBRIDEMENT AND CLOSURE ABDOMINAL WOUND;  Surgeon: [name], MD;  Location: [facility];  Service: Plastics;  Laterality: N/A;  . EXCISION OF ABDOMINAL WALL TUMOR N/A 10/03/2017   Procedure: EXCISION OF COLON CANCER TUMOR FROM ABDOMINAL;  Surgeon: Stark Klein, MD;  Location: [facility];  Service: General;  Laterality: N/A;  TAP BLOCK  . INCISION AND DRAINAGE OF WOUND N/A 11/14/2017   Procedure: IRRIGATION AND DEBRIDEMENT OF ABDOMINAL WALL;  Surgeon: [name], MD;  Location: [facility];  Service: Plastics;  Laterality: N/A;  . IR FLUORO GUIDE PORT INSERTION RIGHT  05/09/2017  . IR US GUIDE VASC ACCESS RIGHT  05/09/2017   port-a-cath      Allergies  Allergen Reactions  . Penicillins Other (See Comments)    Tolerated cefepime 01/01/2018 "PASSED OUT AS A CHILD" > ? SYNCOPE ? PATIENT HAS HAD A PCN REACTION WITH IMMEDIATE RASH, FACIAL/TONGUE/THROAT SWELLING, SOB, OR LIGHTHEADEDNESS WITH HYPOTENSION:  #  #  #  YES  #  #  #   Has patient had a PCN reaction causing severe rash involving mucus membranes or skin necrosis: No Has patient had  a PCN reaction that required hospitalization: No Has patient had a PCN reaction occurring within the last 10 years: No   . Latex Itching and Rash    BED "[place]"    Outpatient Encounter Medications as of 01/07/2018  Medication Sig  . cefTRIAXone (ROCEPHIN) IVPB Inject 2 g into the vein daily. Indication:  Abdominal wound/ septic arthritis of pubic symphysis  Last Day of Therapy:  02/06/2018 Labs - Once weekly:  CBC/D and BMP, Labs - Every other week:  ESR and CRP  . feeding  supplement, ENSURE ENLIVE, (ENSURE ENLIVE) LIQD Take 237 mLs by mouth 2 (two) times daily between meals.  . methocarbamol (ROBAXIN) 500 MG tablet Take 1 tablet (500 mg total) by mouth 3 (three) times daily.  . metroNIDAZOLE (FLAGYL) 500 MG tablet Take 1 tablet (500 mg total) by mouth 3 (three) times daily.  Marland Kitchen oxyCODONE 10 MG TABS Take 1 tablet (10 mg total) by mouth every 6 (six) hours as needed for breakthrough pain.  Marland Kitchen oxyCODONE 30 MG 12 hr tablet Take 1 tablet (30 mg total) by mouth every 12 (twelve) hours.  . polyethylene glycol (MIRALAX / GLYCOLAX) packet Take 17 g by mouth daily as needed for moderate constipation.  . senna-docusate (SENOKOT-S) 8.6-50 MG tablet Take 2 tablets by mouth 2 (two) times daily.  . vitamin C (VITAMIN C) 500 MG tablet Take 1 tablet (500 mg total) by mouth daily.   Facility-Administered Encounter Medications as of 01/07/2018  Medication  . sodium chloride flush (NS) 0.9 % injection 10 mL    Review of Systems  GENERAL: No change in appetite, no fatigue, no weight changes, no fever, chills or weakness MOUTH and THROAT: Denies oral discomfort, gingival pain or bleeding RESPIRATORY: no cough, SOB, DOE, wheezing, hemoptysis CARDIAC: No chest pain, edema or palpitations GI: No abdominal pain, diarrhea, constipation, heart burn, nausea or vomiting PSYCHIATRIC: Denies feelings of depression or anxiety. No report of hallucinations, insomnia, paranoia, or agitation   Pertinent  Health Maintenance Due  Topic Date Due  . PAP SMEAR  12/21/1973  . MAMMOGRAM  12/21/2002  . COLONOSCOPY  12/21/2002  . INFLUENZA VACCINE  07/30/2017  . DEXA SCAN  12/21/2017  . PNA vac Low Risk Adult (1 of 2 - PCV13) 12/21/2017   Fall Risk  07/29/2017  Falls in the past year? Yes  Number falls in past yr: 1  Injury with Fall? No  Risk for fall due to : History of fall(s)  Follow up Falls evaluation completed      Vitals:   01/07/18 0900  BP: 95/72  Pulse: 86  Resp: 19  Temp: (!)  96.6 F (35.9 C)  TempSrc: Oral  SpO2: 98%  Weight: 114 lb 3.2 oz (51.8 kg)  Height: '5\' 6"'$  (1.676 m)   Body mass index is 18.43 kg/m.  Physical Exam  GENERAL APPEARANCE:  In no acute distress.  SKIN:  Has abdominal wound attached to wound vac MOUTH and THROAT: Lips are without lesions. Oral mucosa is moist and without lesions.  RESPIRATORY: Breathing is even & unlabored, BS CTAB CARDIAC: RRR, no murmur,no extra heart sounds, no edema, right chest port-a-cath GI: Has a wound with wound vac EXTREMITIES:  Able to move X 4 extremities, BLE generalized weakness PSYCHIATRIC: Alert and oriented X 3. Affect and behavior are appropriate    Labs reviewed: 01/07/18  WBC 14.8 hemoglobin 9.7 hematocrit 30.8 MCV 87.3 platelets 498 glucose 100 calcium 8.5 creatinine 0.42 BUN 7.3 sodium 136 K4.9 eGFR >  90 Recent Labs    01/02/18 0352  01/04/18 0331 01/05/18 0419 01/06/18 0441  NA 137   < > 131* 134* 134*  K 3.8   < > 3.8 3.7 4.2  CL 102   < > 99* 98* 99*  CO2 30   < > '29 28 24  '$ GLUCOSE 118*   < > 119* 98 98  BUN 8   < > <5* <5* 6  CREATININE 0.53   < > 0.48 0.57 0.60  CALCIUM 7.8*   < > 8.0* 8.1* 8.1*  MG 1.6*  --   --   --   --    < > = values in this interval not displayed.   Recent Labs    07/24/17 0848 08/07/17 0835 12/30/17 0837  AST '23 25 23  '$ ALT '19 21 18  '$ ALKPHOS 124 138 128*  BILITOT 0.33 0.49 0.5  PROT 7.0 7.3 6.2*  ALBUMIN 4.0 3.4* 2.5*   Recent Labs    11/14/17 1242 12/30/17 0837 12/31/17 0517 01/01/18 0440 01/02/18 0352  WBC 9.9 12.8* 9.3 9.9 10.1  NEUTROABS 7.9* 11.2*  --   --  7.1  HGB 12.8 10.3* 8.6* 8.8* 8.2*  HCT 38.9 33.4* 27.4* 28.7* 27.4*  MCV 94.2 88.6 89.0 88.3 87.3  PLT 326 363 330 367 386   Significant Diagnostic Results in last 30 days:  Ct Abdomen Pelvis W Contrast  Result Date: 12/30/2017 CLINICAL DATA:  Lower abdominal pain. History of prior surgery to remove a large anterior pelvic mass. EXAM: CT ABDOMEN AND PELVIS WITH CONTRAST  TECHNIQUE: Multidetector CT imaging of the abdomen and pelvis was performed using the standard protocol following bolus administration of intravenous contrast. CONTRAST:  128m ISOVUE-300 IOPAMIDOL (ISOVUE-300) INJECTION 61% COMPARISON:  CT scan 07/21/2017 FINDINGS: Lower chest: The lung bases are clear except for dependent bibasilar atelectasis. The heart is normal in size. No pericardial effusion. Hepatobiliary: Small scattered calcified granulomas. No worrisome hepatic lesions or intrahepatic biliary dilatation. The gallbladder is normal. No common bile duct dilatation. Pancreas: No mass, inflammation or ductal dilatation. Spleen: Numerous calcified granulomas. Adrenals/Urinary Tract: The adrenal glands and kidneys are unremarkable. Moderate anterior wall thickening of the bladder likely due to the overlying inflammation/infection. Stomach/Bowel: The stomach, duodenum, small bowel and colon are grossly normal in the abdomen. In the pelvis there are small bowel loops right up underneath the anterior abdominal wall with there is a large soft tissue defect from prior surgery. I do not see any definite leaking oral contrast but findings suspicious for small bowel fistula. There is a tract of air in fluid in the anterior pelvic wall in the midline and extending into the right inguinal area. There is also air and fluid in the anterior abdominal wound. Vascular/Lymphatic: Stable atherosclerotic calcifications involving the aorta and branch vessels. No aneurysm or dissection. The major venous structures are patent. Numerous mesenteric and retroperitoneal lymph nodes likely inflammatory/hyperplastic. Reproductive: The uterus is normal. Moderate retroversion. There is a stable left ovarian dermoid measuring 3.7 cm. Other: No free intrapelvic fluid collections Musculoskeletal: CT findings consistent with septic arthritis involving the pubic symphysis without obvious destructive bony changes but most likely associated  osteomyelitis. IMPRESSION: 1. Anterior abdominal wound containing fluid and air along with a probable sinus track extending to the right inguinal area. Findings suspicious for a small bowel fistula although I do not see any obvious leaking oral contrast. 2. Septic arthritis involving the pubic symphysis. 3. Moderate wall thickening of the anterior bladder likely due  to the adjacent inflammatory process. 4. Left ovarian dermoid. Electronically Signed   By: Marijo Sanes M.D.   On: 12/30/2017 14:00   Dg Pelvis Portable  Result Date: 01/01/2018 CLINICAL DATA:  Acute osteomyelitis of pelvic region EXAM: PORTABLE PELVIS 1-2 VIEWS COMPARISON:  CT and plain film 12/30/2017 FINDINGS: There appears to be mild widening of the pubic symphysis with compared to prior plain film. Also, the underlying cortex of the pubic symphysis appears indistinct. Findings likely related to septic arthritis of the pubic symphysis with early changes of adjacent osteomyelitis. IMPRESSION: Slight interval widening of the pubic symphysis with loss of underlying cortex, likely related to septic arthritis and early osteomyelitis. This could be further evaluated with MRI if felt clinically indicated. Electronically Signed   By: Rolm Baptise M.D.   On: 01/01/2018 11:21   Dg Hip Unilat W Or Wo Pelvis 2-3 Views Right  Result Date: 12/30/2017 CLINICAL DATA:  Right hip pain, no known injury, initial encounter EXAM: DG HIP (WITH OR WITHOUT PELVIS) 2-3V RIGHT COMPARISON:  None. FINDINGS: There is no evidence of hip fracture or dislocation. There is no evidence of arthropathy or other focal bone abnormality. IMPRESSION: No acute abnormality noted. Electronically Signed   By: Inez Catalina M.D.   On: 12/30/2017 10:22   Dg Femur Min 2 Views Right  Result Date: 12/30/2017 CLINICAL DATA:  Right leg pain, no known injury, initial encounter EXAM: RIGHT FEMUR 2 VIEWS COMPARISON:  None. FINDINGS: There is no evidence of fracture or other focal bone lesions.  Soft tissues are unremarkable. IMPRESSION: No acute abnormality noted. Electronically Signed   By: Inez Catalina M.D.   On: 12/30/2017 10:27    Assessment/Plan  1. Generalized weakness - for rehabilitation, PT and OT, for therapeutic strengthening exercises, fall precautions   2. Acute osteomyelitis of pelvic region, unspecified laterality ([place]) -  She had wound debridement on 01/01/18 and culture grew group B strep. Infectious disease was consulted and recommended IV Ceftriaxone 2 gm daily and Flagyl 500 mg 1 tab PO TID X 1 month, follow-up with Infectious disease, Dr. Michel Bickers, continue wound vac   3. History of colon cancer - follow-up with plastic surgery, Dr. Lenis Dickinson, in 2 weeks   4. Chronic pain due to neoplasm - well-controlled, continue oxycodone ER 30 mg 1 tab every 12 hours, oxycodone 10 mg 1 tab every 6 hours when necessary for pain and methocarbamol 500 mg 1 tab 3 times a day for muscle spasm      Family/ staff Communication:  Discussed plan of care with patient.  Labs/tests ordered:  CBC and BMP weekly (stop date 02/13/18)  Goals of care:   Short-term rehabilitation   Durenda Age, NP [facility] [phone] (Monday-Friday 8:00 a.m. - 5:00 p.m.) [phone] (after hours)

## 2018-01-11 ENCOUNTER — Encounter: Payer: Self-pay | Admitting: Internal Medicine

## 2018-01-11 DIAGNOSIS — D649 Anemia, unspecified: Secondary | ICD-10-CM | POA: Insufficient documentation

## 2018-01-12 ENCOUNTER — Encounter: Payer: Self-pay | Admitting: Internal Medicine

## 2018-01-12 ENCOUNTER — Non-Acute Institutional Stay (SKILLED_NURSING_FACILITY): Payer: Medicare Other | Admitting: Internal Medicine

## 2018-01-12 DIAGNOSIS — Z9189 Other specified personal risk factors, not elsewhere classified: Secondary | ICD-10-CM | POA: Diagnosis not present

## 2018-01-12 DIAGNOSIS — M86159 Other acute osteomyelitis, unspecified femur: Secondary | ICD-10-CM | POA: Diagnosis not present

## 2018-01-12 DIAGNOSIS — T8189XD Other complications of procedures, not elsewhere classified, subsequent encounter: Secondary | ICD-10-CM | POA: Diagnosis not present

## 2018-01-12 DIAGNOSIS — R1030 Lower abdominal pain, unspecified: Secondary | ICD-10-CM

## 2018-01-12 DIAGNOSIS — T8189XA Other complications of procedures, not elsewhere classified, initial encounter: Secondary | ICD-10-CM | POA: Diagnosis not present

## 2018-01-12 NOTE — Progress Notes (Signed)
NURSING HOME LOCATION:  [facility] ROOM NUMBER:  219-A  CODE STATUS:  DNR  PCP:  Yolanda Watson, No Pcp Per  Yolanda Watson moved to [facility] in December 2017 and has not affiliated with a PCP as yet.  This is a comprehensive admission note to [facility] performed on this date less than 30 days from date of admission. Included are preadmission medical/surgical history;reconciled medication list; family history; social history and comprehensive review of systems.  Corrections and additions to the records were documented . Comprehensive physical exam was also performed. Additionally a clinical summary was entered for each active diagnosis pertinent to this admission in the Problem List to enhance continuity of care.  HPI: She was hospitalized 1/1-01/04/18 with an abdominal wound related to metastatic colon cancer to the abdominal wall.[name] had a colon mass resectedand a colectomy  in 2016 . This was subsequently reversed. She received chemotherapy and radiation for recurrent mass, completing therapies in 2018. She is status post FOLFOX X5.She sees Dr Julieanne Manson, Oncologist. On 01/01/18 she went underwent debridement and wound closure. [name] presented with severe pain in her thighs and pelvis area. She had been unable to walk with a prior 72 hours. She noted a malodorous odor from the abdominal wound & " constant drainage". Culture revealed group B strep, ID recommended IV ceftriaxone and Flagyl for one month. Palliative care was consulted. She was discharged to SNF for PT/OT, and nutritional rehabilitation. Labs at discharge: sodium of 134, creatinine 0.6, hemoglobin 8.2 and hematocrit 27.4. Hyponatremia has improved with a value of 136. Hemoglobin rose to 9.7 and hematocrit 31.  Past medical and surgical history:History includes alcohol abuse. She also has severe deformities of the hands related to trauma. She had dropped a glass snow ball which shattered and caused extensive  tendon injuries and bleeding 30 years ago. She began to have progressive contractures over the subsequent years. These are painless. The abdominal wall tumor was excised 10/03/17  Social history:She drinks at least 2 alcoholic beverages per day. She been a 1 pack per day smoker for 45 years.  Family history:History reviewed & updated  Review of systems: [name] is alert, oriented, and intelligent. She states the pain is improving.She reports that she is not taking the as needed OxyContin on a regular basis, only 1-2 /day  She admits to some anxiety and depression about being in the nursing facility. She is anxious to go home. She does state that she has 15 stairs at home.She states that her weight has been essentially stable "for years except when she gained weight during pregnancy.  Constitutional: No fever,significant weight change  Eyes: No redness, discharge, pain, vision change ENT/mouth: No nasal congestion,  purulent discharge, earache,change in hearing ,sore throat  Cardiovascular: No chest pain, palpitations,paroxysmal nocturnal dyspnea, claudication, edema  Respiratory: No cough, sputum production,hemoptysis, DOE , significant snoring,apnea  Gastrointestinal: No heartburn,dysphagia,abdominal pain, nausea / vomiting,rectal bleeding, melena,change in bowels Genitourinary: No dysuria,hematuria, pyuria,  incontinence, nocturia Dermatologic: No rash, pruritus, change in appearance of skin Neurologic: No dizziness,headache,syncope, seizures, numbness , tingling Psychiatric: No insomnia, anorexia Endocrine: No change in hair/skin/ nails, excessive thirst, excessive hunger, excessive urination  Hematologic/lymphatic: No significant bruising, lymphadenopathy,abnormal bleeding Allergy/immunology: No itchy/ watery eyes, significant sneezing, urticaria, angioedema  Physical exam:  Pertinent or positive findings: She is thin and appears somewhat suboptimally nourished. She has pain in the  pelvic area with opposition applied to the lower extremities. PICC line is present over the right chest. Vacuum device  is applied in the pelvic area. General appearance: no acute distress , increased work of breathing is present.   Lymphatic: No lymphadenopathy about the head, neck, axilla . Eyes: No conjunctival inflammation or lid edema is present. There is no scleral icterus. Ears:  External ear exam shows no significant lesions or deformities.   Nose:  External nasal examination shows no deformity or inflammation. Nasal mucosa are pink and moist without lesions ,exudates Oral exam: lips and gums are healthy appearing.There is no oropharyngeal erythema or exudate . Neck:  No thyromegaly, masses, tenderness noted.    Heart:  Normal rate and regular rhythm. S1 and S2 normal without gallop, murmur, click, rub .  Lungs:Chest surprisingly clear to auscultation without wheezes, rhonchi,rales , rubs. Abdomen:Bowel sounds are normal. Abdomen is soft and nontender with no organomegaly, hernias,masses. GU: deferred  Extremities:  No cyanosis, clubbing,edema  Neurologic exam : Strength equal  in upper extremities Balance,Rhomberg,finger to nose testing could not be completed due to clinical state Deep tendon reflexes are equal Skin: Warm & dry w/o tenting. No significant lesions or rash.  See clinical summary under each active problem in the Problem List with associated updated therapeutic plan

## 2018-01-12 NOTE — Assessment & Plan Note (Signed)
Complete full month of dual anabolic therapy

## 2018-01-12 NOTE — Assessment & Plan Note (Signed)
01/12/18 pain reported is improving, narcotics will be weaned as quickly as possible

## 2018-01-13 DIAGNOSIS — Z9189 Other specified personal risk factors, not elsewhere classified: Secondary | ICD-10-CM | POA: Insufficient documentation

## 2018-01-13 NOTE — Patient Instructions (Signed)
See assessment and plan under each diagnosis in the problem list and acutely for this visit Total time 56 minutes; greater than 50% of the visit spent counseling patient and coordinating care for problems addressed at this encounter  

## 2018-01-13 NOTE — Assessment & Plan Note (Signed)
Risk discussed Change OxyContin to 10 mg every 4 hours as needed Add 1 Aleve and 500 mg Tylenol after each meal X 7 days  Patient will assess response and report

## 2018-01-15 ENCOUNTER — Non-Acute Institutional Stay (SKILLED_NURSING_FACILITY): Payer: Medicare Other | Admitting: Adult Health

## 2018-01-15 ENCOUNTER — Encounter: Payer: Self-pay | Admitting: Adult Health

## 2018-01-15 DIAGNOSIS — M86159 Other acute osteomyelitis, unspecified femur: Secondary | ICD-10-CM

## 2018-01-15 DIAGNOSIS — Z85038 Personal history of other malignant neoplasm of large intestine: Secondary | ICD-10-CM | POA: Diagnosis not present

## 2018-01-15 DIAGNOSIS — G893 Neoplasm related pain (acute) (chronic): Secondary | ICD-10-CM | POA: Diagnosis not present

## 2018-01-15 DIAGNOSIS — R531 Weakness: Secondary | ICD-10-CM | POA: Diagnosis not present

## 2018-01-15 LAB — BASIC METABOLIC PANEL
BUN: 4 (ref 4–21)
Creatinine: 0.4 — AB (ref 0.5–1.1)
Glucose: 104
Potassium: 4.6 (ref 3.4–5.3)
SODIUM: 141 (ref 137–147)

## 2018-01-15 LAB — CBC AND DIFFERENTIAL
HEMATOCRIT: 28 — AB (ref 36–46)
HEMOGLOBIN: 9.2 — AB (ref 12.0–16.0)
NEUTROS ABS: 4
PLATELETS: 445 — AB (ref 150–399)
WBC: 6.4

## 2018-01-15 NOTE — Progress Notes (Signed)
Location:  [facility] Room Number: 219-A Place of Service:  SNF (31) Provider:  Durenda Age, NP  Patient Care Team: Patient, No Pcp Per as PCP - General (General Practice)  Extended Emergency Contact Information Primary Emergency Contact: Efrain Sella [place] of [place] Mobile Phone: [phone] Relation: Spouse  Code Status:  DNR  Goals of care: Advanced Directive information Advanced Directives 01/15/2018  Does Patient Have a Medical Advance Directive? Yes  Type of Advance Directive Out of facility DNR (pink MOST or yellow form)  Does patient want to make changes to medical advance directive? No - Patient declined  Would patient like information on creating a medical advance directive? No - Patient declined     Chief Complaint  Patient presents with  . Discharge Note    Patient is discharging home on 01/19/18    HPI:  Pt is a 67 y.o. female seen today for a discharge visit.  She will discharge to home on 01/19/18 with home health OT, PT, and Nursing for wound vac changes, IV antibiotics, and midline care.    She has been admitted to [facility] on 01/06/18 from [facility] hospitalization dates 1/01 to 01/06/18 for osteomyelitis of her pelvic region. She was having severe pain in her inner thighs and pelvis  region causing her not to walk. Her abdominal wound was "smelly". She had wound debridement on 01/01/18 and culture grew group b strep. Infectious disease was consulted and recommended IV Ceftriaxone and Flagyl X 1 month. She was diagnosed with cancer in 2016 and had a mass removed and a colectomy. She had reversal later but the mass came back and grew outward than inward. She has completed chemotherapy and radiation in 2018 and was referred to plastics to have the mass removed. She believes she is cancer free. She has a PMH of metastatic colon cancer, acquired bilateral hand deformity, and alcohol abuse. CBC and BMP needs to  be done weekly till 02/13/18.  She was seen in the room with husband at bedside.  Patient was admitted to this facility for short-term rehabilitation after the patient's recent hospitalization.  Patient has completed SNF rehabilitation and therapy has cleared the patient for discharge.    Past Medical History:  Diagnosis Date  . Acquired bilateral hand deformity    both hands with severe digit contracture. VERY limited ROM   . Alcohol abuse   . Colon cancer ([place]) 2016   metastatic to abdominal wall  . Port-A-Cath in place since 04-2017   right chest    Past Surgical History:  Procedure Laterality Date  . ABDOMINAL SURGERY  2016  . ABDOMINOPLASTY N/A 10/03/2017   Procedure: ABDOMINAL WALL RECONSTRUCTION WITH STATTICE;  Surgeon: [name], MD;  Location: [facility];  Service: Plastics;  Laterality: N/A;  . DEBRIDEMENT AND CLOSURE WOUND N/A 01/01/2018   Procedure: DEBRIDEMENT AND CLOSURE ABDOMINAL WOUND;  Surgeon: [name], MD;  Location: [facility];  Service: Plastics;  Laterality: N/A;  . EXCISION OF ABDOMINAL WALL TUMOR N/A 10/03/2017   Procedure: EXCISION OF COLON CANCER TUMOR FROM ABDOMINAL;  Surgeon: Stark Klein, MD;  Location: [facility];  Service: General;  Laterality: N/A;  TAP BLOCK  . INCISION AND DRAINAGE OF WOUND N/A 11/14/2017   Procedure: IRRIGATION AND DEBRIDEMENT OF ABDOMINAL WALL;  Surgeon: [name], MD;  Location: [facility];  Service: Plastics;  Laterality: N/A;  . IR FLUORO GUIDE PORT INSERTION RIGHT  05/09/2017  . IR US GUIDE VASC ACCESS RIGHT  05/09/2017  port-a-cath      Allergies  Allergen Reactions  . Penicillins Other (See Comments)    Tolerated cefepime 01/01/2018 "PASSED OUT AS A CHILD" > ? SYNCOPE ? PATIENT HAS HAD A PCN REACTION WITH IMMEDIATE RASH, FACIAL/TONGUE/THROAT SWELLING, SOB, OR LIGHTHEADEDNESS WITH HYPOTENSION:  #  #  #  YES  #  #  #   Has patient had a PCN reaction causing severe rash involving mucus membranes or skin necrosis: No Has  patient had a PCN reaction that required hospitalization: No Has patient had a PCN reaction occurring within the last 10 years: No   . Latex Itching and Rash    BED "CHUX PADS"    Outpatient Encounter Medications as of 01/15/2018  Medication Sig  . acetaminophen (TYLENOL) 500 MG tablet Take 500 mg by mouth every 8 (eight) hours. Take with naproxen  . cefTRIAXone (ROCEPHIN) IVPB Inject 2 g into the vein daily. Indication:  Abdominal wound/ septic arthritis of pubic symphysis  Last Day of Therapy:  02/06/2018 Labs - Once weekly:  CBC/D and BMP, Labs - Every other week:  ESR and CRP  . feeding supplement, ENSURE ENLIVE, (ENSURE ENLIVE) LIQD Take 237 mLs by mouth 2 (two) times daily between meals.  . methocarbamol (ROBAXIN) 500 MG tablet Take 1 tablet (500 mg total) by mouth 3 (three) times daily.  . metroNIDAZOLE (FLAGYL) 500 MG tablet Take 1 tablet (500 mg total) by mouth 3 (three) times daily.  . naproxen sodium (ALEVE) 220 MG tablet Take 220 mg by mouth 3 (three) times daily. Take after a meal along with Tylenol 500 mg x7 days for pain  . NUTRITIONAL SUPPLEMENT LIQD Take 120 mLs by mouth 2 (two) times daily.  . Oxycodone HCl 10 MG TABS Take 10 mg by mouth every 4 (four) hours as needed (Pain).  . polyethylene glycol (MIRALAX / GLYCOLAX) packet Take 17 g by mouth daily as needed for moderate constipation.  . senna-docusate (SENOKOT-S) 8.6-50 MG tablet Take 2 tablets by mouth 2 (two) times daily.  . vitamin C (VITAMIN C) 500 MG tablet Take 1 tablet (500 mg total) by mouth daily.  . [DISCONTINUED] oxyCODONE 10 MG TABS Take 1 tablet (10 mg total) by mouth every 6 (six) hours as needed for breakthrough pain.  . [DISCONTINUED] oxyCODONE 30 MG 12 hr tablet Take 1 tablet (30 mg total) by mouth every 12 (twelve) hours.   Facility-Administered Encounter Medications as of 01/15/2018  Medication  . sodium chloride flush (NS) 0.9 % injection 10 mL    Review of Systems  GENERAL: No change in appetite,  no fatigue, no weight changes, no fever, chills or weakness MOUTH and THROAT: Denies oral discomfort, gingival pain or bleeding RESPIRATORY: no cough, SOB, DOE, wheezing, hemoptysis CARDIAC: No chest pain, edema or palpitations GI: No abdominal pain, diarrhea, constipation, heart burn, nausea or vomiting GU: Denies dysuria, frequency, hematuria, incontinence, or discharge PSYCHIATRIC: Denies feelings of depression or anxiety. No report of hallucinations, insomnia, paranoia, or agitation   Pertinent  Health Maintenance Due  Topic Date Due  . PAP SMEAR  12/21/1973  . MAMMOGRAM  12/21/2002  . COLONOSCOPY  12/21/2002  . INFLUENZA VACCINE  07/30/2017  . DEXA SCAN  12/21/2017  . PNA vac Low Risk Adult (1 of 2 - PCV13) 12/21/2017   Fall Risk  07/29/2017  Falls in the past year? Yes  Number falls in past yr: 1  Injury with Fall? No  Risk for fall due to : History of fall(s)  Follow up Falls evaluation completed      Vitals:   01/15/18 1735  BP: 120/72  Pulse: 78  Resp: 20  Temp: 98.2 F (36.8 C)  TempSrc: Oral  SpO2: 98%  Weight: 110 lb 9.6 oz (50.2 kg)  Height: _0  (1.676 m)   Body mass index is 17.85 kg/m.  Physical Exam  GENERAL APPEARANCE:  In no acute distress.  SKIN:  Abdominal wound attached to wound vac MOUTH and THROAT: Lips are without lesions. Oral mucosa is moist and without lesions.  RESPIRATORY: Breathing is even & unlabored, BS CTAB CARDIAC: RRR, no murmur,no extra heart sounds, no edema, left chest port-a-cath, Left upper arm has single lumen midline GI: Abdomen soft, normal BS, no masses, no tenderness, no hepatomegaly, no splenomegaly EXTREMITIES:  Able to move X 4 extremities PSYCHIATRIC: Alert and oriented X 3. Affect and behavior are appropriate   Labs reviewed: 01/15/18  WBC 6.4 hemoglobin 9.2 hematocrit 28.1 MCV 84.2 platelets 445 glucose 104 calcium 8.6 creatinine 0.35 BUN 3.7 sodium 141 K4.6 eGFR >90 Recent Labs    01/02/18 0352   01/04/18 0331 01/05/18 0419 01/06/18 0441 01/07/18  NA 137   < > 131* 134* 134* 136*  K 3.8   < > 3.8 3.7 4.2 4.9  CL 102   < > 99* 98* 99*  --   CO2 30   < > _1 --   GLUCOSE 118*   < > 119* 98 98  --   BUN 8   < > <5* <5* 6 7  CREATININE 0.53   < > 0.48 0.57 0.60 0.4*  CALCIUM 7.8*   < > 8.0* 8.1* 8.1*  --   MG 1.6*  --   --   --   --   --    < > = values in this interval not displayed.   Recent Labs    07/24/17 0848 08/07/17 0835 12/30/17 0837  AST _2 ALT _3 ALKPHOS 124 138 128*  BILITOT 0.33 0.49 0.5  PROT 7.0 7.3 6.2*  ALBUMIN 4.0 3.4* 2.5*   Recent Labs    12/30/17 0837 12/31/17 0517 01/01/18 0440 01/02/18 0352 01/07/18  WBC 12.8* 9.3 9.9 10.1 14.8  NEUTROABS 11.2*  --   --  7.1 13  HGB 10.3* 8.6* 8.8* 8.2* 9.7*  HCT 33.4* 27.4* 28.7* 27.4* 31*  MCV 88.6 89.0 88.3 87.3  --   PLT 363 330 367 386 498*    Significant Diagnostic Results in last 30 days:  Ct Abdomen Pelvis W Contrast  Result Date: 12/30/2017 CLINICAL DATA:  Lower abdominal pain. History of prior surgery to remove a large anterior pelvic mass. EXAM: CT ABDOMEN AND PELVIS WITH CONTRAST TECHNIQUE: Multidetector CT imaging of the abdomen and pelvis was performed using the standard protocol following bolus administration of intravenous contrast. CONTRAST:  155m ISOVUE-300 IOPAMIDOL (ISOVUE-300) INJECTION 61% COMPARISON:  CT scan 07/21/2017 FINDINGS: Lower chest: The lung bases are clear except for dependent bibasilar atelectasis. The heart is normal in size. No pericardial effusion. Hepatobiliary: Small scattered calcified granulomas. No worrisome hepatic lesions or intrahepatic biliary dilatation. The gallbladder is normal. No common bile duct dilatation. Pancreas: No mass, inflammation or ductal dilatation. Spleen: Numerous calcified granulomas. Adrenals/Urinary Tract: The adrenal glands and kidneys are unremarkable. Moderate anterior wall thickening of the bladder likely due to the  overlying inflammation/infection. Stomach/Bowel: The stomach, duodenum, small bowel and colon are grossly normal in the abdomen. In  the pelvis there are small bowel loops right up underneath the anterior abdominal wall with there is a large soft tissue defect from prior surgery. I do not see any definite leaking oral contrast but findings suspicious for small bowel fistula. There is a tract of air in fluid in the anterior pelvic wall in the midline and extending into the right inguinal area. There is also air and fluid in the anterior abdominal wound. Vascular/Lymphatic: Stable atherosclerotic calcifications involving the aorta and branch vessels. No aneurysm or dissection. The major venous structures are patent. Numerous mesenteric and retroperitoneal lymph nodes likely inflammatory/hyperplastic. Reproductive: The uterus is normal. Moderate retroversion. There is a stable left ovarian dermoid measuring 3.7 cm. Other: No free intrapelvic fluid collections Musculoskeletal: CT findings consistent with septic arthritis involving the pubic symphysis without obvious destructive bony changes but most likely associated osteomyelitis. IMPRESSION: 1. Anterior abdominal wound containing fluid and air along with a probable sinus track extending to the right inguinal area. Findings suspicious for a small bowel fistula although I do not see any obvious leaking oral contrast. 2. Septic arthritis involving the pubic symphysis. 3. Moderate wall thickening of the anterior bladder likely due to the adjacent inflammatory process. 4. Left ovarian dermoid. Electronically Signed   By: Marijo Sanes M.D.   On: 12/30/2017 14:00   Dg Pelvis Portable  Result Date: 01/01/2018 CLINICAL DATA:  Acute osteomyelitis of pelvic region EXAM: PORTABLE PELVIS 1-2 VIEWS COMPARISON:  CT and plain film 12/30/2017 FINDINGS: There appears to be mild widening of the pubic symphysis with compared to prior plain film. Also, the underlying cortex of the  pubic symphysis appears indistinct. Findings likely related to septic arthritis of the pubic symphysis with early changes of adjacent osteomyelitis. IMPRESSION: Slight interval widening of the pubic symphysis with loss of underlying cortex, likely related to septic arthritis and early osteomyelitis. This could be further evaluated with MRI if felt clinically indicated. Electronically Signed   By: Rolm Baptise M.D.   On: 01/01/2018 11:21   Dg Hip Unilat W Or Wo Pelvis 2-3 Views Right  Result Date: 12/30/2017 CLINICAL DATA:  Right hip pain, no known injury, initial encounter EXAM: DG HIP (WITH OR WITHOUT PELVIS) 2-3V RIGHT COMPARISON:  None. FINDINGS: There is no evidence of hip fracture or dislocation. There is no evidence of arthropathy or other focal bone abnormality. IMPRESSION: No acute abnormality noted. Electronically Signed   By: Inez Catalina M.D.   On: 12/30/2017 10:22   Dg Femur Min 2 Views Right  Result Date: 12/30/2017 CLINICAL DATA:  Right leg pain, no known injury, initial encounter EXAM: RIGHT FEMUR 2 VIEWS COMPARISON:  None. FINDINGS: There is no evidence of fracture or other focal bone lesions. Soft tissues are unremarkable. IMPRESSION: No acute abnormality noted. Electronically Signed   By: Inez Catalina M.D.   On: 12/30/2017 10:27    Assessment/Plan  1. Generalized weakness - for home health PT and OT, for therapeutic strengthening exercises, fall precautions   2. Acute osteomyelitis of pelvic region, unspecified laterality ([place]) - will need CBC and BMP weekly until 02/13/18, follow up with infectious disease, Dr. Daniel Nones, continue wound VAC  - cefTRIAXone (ROCEPHIN) IVPB; Inject 2 g into the vein daily for 21 days. Indication:  Abdominal wound/ septic arthritis of pubic symphysis  Last Day of Therapy:  02/06/2018 Labs - Once weekly:  CBC/D and BMP, Labs - Every other week:  ESR and CRP  Dispense: 17 Units; Refill: 0 - metroNIDAZOLE (FLAGYL) 500 MG  tablet; Take 1 tablet (500 mg  total) by mouth 3 (three) times daily.  Dispense: 90 tablet; Refill: 0  3. History of colon cancer - she has completed chemotherapy and radiation in 2018, follow-up with plastic surgery, Dr. Lenis Dickinson   4. Chronic pain due to neoplasm - well-controlled  - methocarbamol (ROBAXIN) 500 MG tablet; Take 1 tablet (500 mg total) by mouth 3 (three) times daily.  Dispense: 90 tablet; Refill: 0 - Oxycodone HCl 10 MG TABS; Take 1 tablet (10 mg total) by mouth every 4 (four) hours as needed for up to 30 doses (Pain).  Dispense: 30 tablet; Refill: 0 `   I have filled out patient's discharge paperwork and written prescriptions.  Patient will receive home health PT, OT, and Nursing.  DME provided:  Gilford Rile, 3n1, shower bench.   Total discharge time: Greater than 30 minutes Greater than 50% was spent in counseling and coordination of care.   Discharge time involved coordination of the discharge process with social worker, nursing staff and therapy department. Medical justification for home health services/DME verified.      Durenda Age, NP [facility] and Adult [facility] [phone] (Monday-Friday 8:00 a.m. - 5:00 p.m.) [phone] (after hours)

## 2018-01-16 MED ORDER — CEFTRIAXONE IV (FOR PTA / DISCHARGE USE ONLY): 2.0000 g | INTRAVENOUS | 0 refills | Status: AC

## 2018-01-16 MED ORDER — METRONIDAZOLE 500 MG PO TABS: 500.0000 mg | ORAL_TABLET | Freq: Three times a day (TID) | ORAL | 0 refills | Status: AC

## 2018-01-16 MED ORDER — METHOCARBAMOL 500 MG PO TABS: 500.0000 mg | ORAL_TABLET | Freq: Three times a day (TID) | ORAL | 0 refills | Status: DC

## 2018-01-16 MED ORDER — OXYCODONE HCL 10 MG PO TABS: 10.0000 mg | ORAL_TABLET | ORAL | 0 refills | Status: DC | PRN

## 2018-01-19 ENCOUNTER — Ambulatory Visit: Payer: Medicaid Other | Admitting: Oncology

## 2018-01-19 ENCOUNTER — Other Ambulatory Visit: Payer: Medicaid Other

## 2018-01-22 DIAGNOSIS — C189 Malignant neoplasm of colon, unspecified: Secondary | ICD-10-CM | POA: Diagnosis not present

## 2018-01-22 DIAGNOSIS — T8140XA Infection following a procedure, unspecified, initial encounter: Secondary | ICD-10-CM | POA: Diagnosis not present

## 2018-01-22 DIAGNOSIS — F17200 Nicotine dependence, unspecified, uncomplicated: Secondary | ICD-10-CM | POA: Diagnosis not present

## 2018-01-22 DIAGNOSIS — M6281 Muscle weakness (generalized): Secondary | ICD-10-CM | POA: Diagnosis not present

## 2018-01-22 DIAGNOSIS — Z0000 Encounter for general adult medical examination without abnormal findings: Secondary | ICD-10-CM | POA: Diagnosis not present

## 2018-01-26 DIAGNOSIS — Z9221 Personal history of antineoplastic chemotherapy: Secondary | ICD-10-CM | POA: Diagnosis not present

## 2018-01-26 DIAGNOSIS — M6281 Muscle weakness (generalized): Secondary | ICD-10-CM | POA: Diagnosis not present

## 2018-01-26 DIAGNOSIS — Z792 Long term (current) use of antibiotics: Secondary | ICD-10-CM | POA: Diagnosis not present

## 2018-01-26 DIAGNOSIS — G893 Neoplasm related pain (acute) (chronic): Secondary | ICD-10-CM | POA: Diagnosis not present

## 2018-01-26 DIAGNOSIS — C189 Malignant neoplasm of colon, unspecified: Secondary | ICD-10-CM | POA: Diagnosis not present

## 2018-01-26 DIAGNOSIS — R262 Difficulty in walking, not elsewhere classified: Secondary | ICD-10-CM | POA: Diagnosis not present

## 2018-01-26 DIAGNOSIS — Z483 Aftercare following surgery for neoplasm: Secondary | ICD-10-CM | POA: Diagnosis not present

## 2018-01-27 ENCOUNTER — Ambulatory Visit: Payer: Self-pay | Admitting: Oncology

## 2018-01-27 ENCOUNTER — Other Ambulatory Visit: Payer: Medicare Other

## 2018-02-03 ENCOUNTER — Telehealth: Payer: Self-pay | Admitting: *Deleted

## 2018-02-03 NOTE — Telephone Encounter (Signed)
Yes

## 2018-02-03 NOTE — Telephone Encounter (Signed)
Denise RN taking care of the patient with [facility] called to advise she was unable to get labs today at visit. She advised visit was long and complicated as the patient had taken her wound vac apart and fixing it and getting it back in place took longer than expected. She is scheduled to see the patient again Friday 02/06/18 and she will get labs at that time. She wants the doctor to know. Advised will do and told her the patient is to be seen here Thursday 02/05/18. If we do not do labs will let her know.  [name] Gauss RN [phone]

## 2018-02-04 DIAGNOSIS — Z85831 Personal history of malignant neoplasm of soft tissue: Secondary | ICD-10-CM | POA: Diagnosis not present

## 2018-02-04 DIAGNOSIS — S31109D Unspecified open wound of abdominal wall, unspecified quadrant without penetration into peritoneal cavity, subsequent encounter: Secondary | ICD-10-CM | POA: Diagnosis not present

## 2018-02-04 DIAGNOSIS — C774 Secondary and unspecified malignant neoplasm of inguinal and lower limb lymph nodes: Secondary | ICD-10-CM | POA: Diagnosis not present

## 2018-02-04 DIAGNOSIS — Z85038 Personal history of other malignant neoplasm of large intestine: Secondary | ICD-10-CM | POA: Diagnosis not present

## 2018-02-05 ENCOUNTER — Other Ambulatory Visit: Payer: Self-pay | Admitting: Pharmacist

## 2018-02-05 ENCOUNTER — Ambulatory Visit (INDEPENDENT_AMBULATORY_CARE_PROVIDER_SITE_OTHER): Payer: Medicare Other | Admitting: Internal Medicine

## 2018-02-05 DIAGNOSIS — M86159 Other acute osteomyelitis, unspecified femur: Secondary | ICD-10-CM

## 2018-02-05 NOTE — Progress Notes (Addendum)
[facility] for Infectious Disease  Patient Active Problem List   Diagnosis Date Noted  . Abdominal wall abscess at site of surgical wound     Priority: High  . Acute osteomyelitis of pelvic region [facility])     Priority: High  . At risk for adverse drug event 01/13/2018  . Anemia, unspecified 01/11/2018  . Abdominal pain, lower   . Encounter for palliative care   . Open wound of abdomen 11/14/2017  . Port-A-Cath in place 10/21/2017  . Local recurrence of colon cancer ([place]) 10/03/2017  . 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 Cancer of right colon ([place]) 05/05/2017    Patient's Medications  New Prescriptions   No medications on file  Previous Medications   ACETAMINOPHEN (TYLENOL) 500 MG TABLET    Take 500 mg by mouth every 8 (eight) hours. Take with naproxen   CEFTRIAXONE (ROCEPHIN) IVPB    Inject 2 g into the vein daily for 21 days. Indication:  Abdominal wound/ septic arthritis of pubic symphysis  Last Day of Therapy:  02/06/2018 Labs - Once weekly:  CBC/D and BMP, Labs - Every other week:  ESR and CRP   FEEDING SUPPLEMENT, ENSURE ENLIVE, (ENSURE ENLIVE) LIQD    Take 237 mLs by mouth 2 (two) times daily between meals.   METHOCARBAMOL (ROBAXIN) 500 MG TABLET    Take 1 tablet (500 mg total) by mouth 3 (three) times daily.   METRONIDAZOLE (FLAGYL) 500 MG TABLET    Take 1 tablet (500 mg total) by mouth 3 (three) times daily.   NAPROXEN SODIUM (ALEVE) 220 MG TABLET    Take 220 mg by mouth 3 (three) times daily. Take after a meal along with Tylenol 500 mg x7 days for pain   NUTRITIONAL SUPPLEMENT LIQD    Take 120 mLs by mouth 2 (two) times daily.   OXYCODONE HCL 10 MG TABS    Take 1 tablet (10 mg total) by mouth every 4 (four) hours as needed for up to 30 doses (Pain).   POLYETHYLENE GLYCOL (MIRALAX / GLYCOLAX) PACKET    Take 17 g by mouth daily as needed for moderate constipation.   SENNA-DOCUSATE (SENOKOT-S) 8.6-50 MG TABLET    Take 2 tablets by  mouth 2 (two) times daily.   VITAMIN C (VITAMIN C) 500 MG TABLET    Take 1 tablet (500 mg total) by mouth daily.  Modified Medications   No medications on file  Discontinued Medications   No medications on file    Subjective: Ms. Brodman is in with her husband, Richardson Landry, for her hospital follow-up visit.  She had a poor healing lower abdominal wound following recent surgery, chemotherapy and radiation for recurrent abdominal wall cancer.  She developed increasing, foul-smelling wound drainage followed by acute onset of severe pain in her pelvis and buttocks that worsened with any movement, standing or attempting to walk.  She was admitted on 12/30/2017 and CT scan raised concerns about possible small bowel fistula although no contrast extravasation was seen.  There was also some fluid in the right groin and evidence of early septic arthritis of the pubic symphysis.  She underwent wound and bone debridement on 01/01/2018.  Operative cultures grew group B strep, viridans strep, diphtheroids and Bacteroides.  She was discharged on IV ceftriaxone through her Port-A-Cath and oral metronidazole.  She has now completed 41 days of total antibiotic therapy.  She is feeling much better.  She is having  much less pain and is now able to ambulate some without her walker.  She is only requiring occasional Aleve for pain.  Her wound VAC was removed yesterday when she saw her plastic surgeon [name].  She is having much less wound drainage the sinus tract is much smaller.  Dr. Para Skeans note indicated that the superficial wound was now only 9 x 6 cm deep in its greatest dimensions.  There was still a small amount of undermining at 6:00 that tracked down to bone about 6 cm.  There was scant drainage.  There is no longer any odor.  Her home health agency has not done lab work as I had instructed.  She has not had any problems tolerating her antibody a slightly better taste in her mouth due to the metronidazole.  Review of  Systems: Review of Systems  Constitutional: Negative for chills, diaphoresis, fever and weight loss.  Gastrointestinal: Negative for abdominal pain, diarrhea, nausea and vomiting.  Musculoskeletal: Positive for joint pain.  Skin: Negative for rash.    Past Medical History:  Diagnosis Date  . Acquired bilateral hand deformity    both hands with severe digit contracture. VERY limited ROM   . Alcohol abuse   . Colon cancer ([place]) 2016   metastatic to abdominal wall  . Port-A-Cath in place since 04-2017   right chest     Social History   Tobacco Use  . Smoking status: Current Every Day Smoker    Packs/day: 1.00    Years: 45.00    Pack years: 45.00    Types: Cigarettes  . Smokeless tobacco: Never Used  . Tobacco comment: She has not smoked since 12/29/17  Substance Use Topics  . Alcohol use: Yes    Alcohol/week: 1.2 - 2.4 oz    Types: 2 - 4 Shots of liquor per week    Comment: denies problem with ETOH but contineus to drink "a couple of drinks per day"  . Drug use: No    Family History  Problem Relation Age of Onset  . Heart disease Mother   . Stroke Maternal Aunt   . Cancer Neg Hx   . Diabetes Neg Hx     Allergies  Allergen Reactions  . Penicillins Other (See Comments)    Tolerated cefepime 01/01/2018 "PASSED OUT AS A CHILD" > ? SYNCOPE ? PATIENT HAS HAD A PCN REACTION WITH IMMEDIATE RASH, FACIAL/TONGUE/THROAT SWELLING, SOB, OR LIGHTHEADEDNESS WITH HYPOTENSION:  #  #  #  YES  #  #  #   Has patient had a PCN reaction causing severe rash involving mucus membranes or skin necrosis: No Has patient had a PCN reaction that required hospitalization: No Has patient had a PCN reaction occurring within the last 10 years: No   . Latex Itching and Rash    BED "CHUX PADS"    Objective: Vitals:   02/05/18 1044  BP: 121/80  Pulse: (!) 105  Temp: 97.7 F (36.5 C)  TempSrc: Oral  Weight: 103 lb (46.7 kg)  Height: '5\' 6"'$  (1.676 m)   Body mass index is 16.62  kg/m.  Physical Exam  Constitutional:  She is smiling and in much better spirits.  Cardiovascular: Normal rate.  No murmur heard. Pulmonary/Chest: Effort normal and breath sounds normal.  Her right anterior chest Port-A-Cath is accessed.  The site appears normal.  Abdominal: Soft. She exhibits no distension. There is no tenderness.  Her wound was not examined today.  Neurological: She is alert.  Skin:  No rash noted.  Psychiatric: Mood and affect normal.    Lab Results    Problem List Items Addressed This Visit      High   Acute osteomyelitis of pelvic region [facility])    She is much improved on therapy for chronic, polymicrobial abdominal wound infection complicated by septic arthritis of the symphysis pubis.  She is now completed nearly 6 weeks of total antibiotic therapy.  I will check blood work today including CBC, BMP, sed rate and C-reactive protein.  I discussed antibiotic options with them today.  She is in favor of continuing her current regimen a little longer I feel that is best.  I will call her tomorrow after I have reviewed her blood test results.  She will follow-up here in 4 weeks.  Sed Rate (mm/h)  Date Value  02/05/2018 39 (H)   CRP (mg/L)  Date Value  02/05/2018 0.8   I called Ms. Kayleen Memos and reviewed her lab results today.  I discussed management options with her again today and we have agreed to extend her ceftriaxone and metronidazole for 2 more weeks to complete 8 weeks of therapy.  She will follow-up here in 4 weeks.      Relevant Orders   C-reactive protein (Completed)   Sedimentation rate (Completed)   CBC (Completed)   Basic metabolic panel (Completed)       Michel Bickers, MD [facility] for [facility] [phone] pager   [phone] cell 02/06/2018, 4:02 PM

## 2018-02-05 NOTE — Assessment & Plan Note (Addendum)
She is much improved on therapy for chronic, polymicrobial abdominal wound infection complicated by septic arthritis of the symphysis pubis.  She is now completed nearly 6 weeks of total antibiotic therapy.  I will check blood work today including CBC, BMP, sed rate and C-reactive protein.  I discussed antibiotic options with them today.  She is in favor of continuing her current regimen a little longer I feel that is best.  I will call her tomorrow after I have reviewed her blood test results.  She will follow-up here in 4 weeks.  Sed Rate (mm/h)  Date Value  02/05/2018 39 (H)   CRP (mg/L)  Date Value  02/05/2018 0.8   I called Ms. Kayleen Memos and reviewed her lab results today.  I discussed management options with her again today and we have agreed to extend her ceftriaxone and metronidazole for 2 more weeks to complete 8 weeks of therapy.  She will follow-up here in 4 weeks.

## 2018-02-06 ENCOUNTER — Telehealth: Payer: Self-pay | Admitting: *Deleted

## 2018-02-06 LAB — CBC
HEMATOCRIT: 38.7 % (ref 35.0–45.0)
Hemoglobin: 12.3 g/dL (ref 11.7–15.5)
MCH: 27.4 pg (ref 27.0–33.0)
MCHC: 31.8 g/dL — ABNORMAL LOW (ref 32.0–36.0)
MCV: 86.2 fL (ref 80.0–100.0)
MPV: 10.2 fL (ref 7.5–12.5)
Platelets: 368 10*3/uL (ref 140–400)
RBC: 4.49 10*6/uL (ref 3.80–5.10)
RDW: 17.5 % — AB (ref 11.0–15.0)
WBC: 8.6 10*3/uL (ref 3.8–10.8)

## 2018-02-06 LAB — BASIC METABOLIC PANEL
BUN: 10 mg/dL (ref 7–25)
CALCIUM: 9.5 mg/dL (ref 8.6–10.4)
CO2: 23 mmol/L (ref 20–32)
CREATININE: 0.57 mg/dL (ref 0.50–0.99)
Chloride: 106 mmol/L (ref 98–110)
GLUCOSE: 118 mg/dL — AB (ref 65–99)
POTASSIUM: 5.3 mmol/L (ref 3.5–5.3)
Sodium: 139 mmol/L (ref 135–146)

## 2018-02-06 LAB — SEDIMENTATION RATE: SED RATE: 39 mm/h — AB (ref 0–30)

## 2018-02-06 LAB — C-REACTIVE PROTEIN: CRP: 0.8 mg/L (ref <8.0)

## 2018-02-06 NOTE — Telephone Encounter (Signed)
Per verbal from Dr Megan Salon called [facility] to advise to extend the patient IV ceftriaxone for two more weeks to end on 02/20/18. Order given and patient aware of extension as well.

## 2018-02-09 ENCOUNTER — Ambulatory Visit: Payer: Self-pay | Admitting: Oncology

## 2018-02-09 ENCOUNTER — Other Ambulatory Visit: Payer: Medicare Other

## 2018-02-09 DIAGNOSIS — M6281 Muscle weakness (generalized): Secondary | ICD-10-CM | POA: Diagnosis not present

## 2018-02-09 DIAGNOSIS — L02211 Cutaneous abscess of abdominal wall: Secondary | ICD-10-CM | POA: Diagnosis not present

## 2018-02-11 DIAGNOSIS — L02211 Cutaneous abscess of abdominal wall: Secondary | ICD-10-CM | POA: Diagnosis not present

## 2018-02-11 DIAGNOSIS — M868X8 Other osteomyelitis, other site: Secondary | ICD-10-CM | POA: Diagnosis not present

## 2018-02-18 DIAGNOSIS — L02211 Cutaneous abscess of abdominal wall: Secondary | ICD-10-CM | POA: Diagnosis not present

## 2018-02-18 DIAGNOSIS — M868X8 Other osteomyelitis, other site: Secondary | ICD-10-CM | POA: Diagnosis not present

## 2018-02-20 DIAGNOSIS — S31109D Unspecified open wound of abdominal wall, unspecified quadrant without penetration into peritoneal cavity, subsequent encounter: Secondary | ICD-10-CM | POA: Diagnosis not present

## 2018-02-20 DIAGNOSIS — C774 Secondary and unspecified malignant neoplasm of inguinal and lower limb lymph nodes: Secondary | ICD-10-CM | POA: Diagnosis not present

## 2018-02-20 DIAGNOSIS — C189 Malignant neoplasm of colon, unspecified: Secondary | ICD-10-CM | POA: Diagnosis not present

## 2018-02-23 ENCOUNTER — Telehealth: Payer: Self-pay | Admitting: *Deleted

## 2018-02-23 NOTE — Telephone Encounter (Signed)
[name] Gauss, RN from [facility], called for orders to deaccess the patient's port a cath. Ok per verbal order from Dr Megan Salon to deaccess the port a cath. Order repeated and verified. Landis Gandy, RN

## 2018-02-24 ENCOUNTER — Other Ambulatory Visit: Payer: Self-pay | Admitting: Pharmacist

## 2018-03-05 ENCOUNTER — Ambulatory Visit: Payer: Medicare Other | Admitting: Internal Medicine

## 2018-03-10 ENCOUNTER — Encounter: Payer: Self-pay | Admitting: Internal Medicine

## 2018-03-10 ENCOUNTER — Ambulatory Visit (INDEPENDENT_AMBULATORY_CARE_PROVIDER_SITE_OTHER): Payer: Medicare Other | Admitting: Internal Medicine

## 2018-03-10 DIAGNOSIS — M86159 Other acute osteomyelitis, unspecified femur: Secondary | ICD-10-CM | POA: Diagnosis not present

## 2018-03-10 NOTE — Progress Notes (Signed)
[facility] for Infectious Disease  Patient Active Problem List   Diagnosis Date Noted  . Abdominal wall abscess at site of surgical wound     Priority: High  . Acute osteomyelitis of pelvic region [facility])     Priority: High  . At risk for adverse drug event 01/13/2018  . Anemia, unspecified 01/11/2018  . Abdominal pain, lower   . Encounter for palliative care   . Open wound of abdomen 11/14/2017  . Port-A-Cath in place 10/21/2017  . Local recurrence of colon cancer ([place]) 10/03/2017  . Secondary malignancy of inguinal lymph nodes ([place]) 07/29/2017  . Cancer of right colon ([place]) 05/05/2017    Patient's Medications  New Prescriptions   No medications on file  Previous Medications   ACETAMINOPHEN (TYLENOL) 500 MG TABLET    Take 500 mg by mouth every 8 (eight) hours. Take with naproxen   FEEDING SUPPLEMENT, ENSURE ENLIVE, (ENSURE ENLIVE) LIQD    Take 237 mLs by mouth 2 (two) times daily between meals.   METHOCARBAMOL (ROBAXIN) 500 MG TABLET    Take 1 tablet (500 mg total) by mouth 3 (three) times daily.   NAPROXEN SODIUM (ALEVE) 220 MG TABLET    Take 220 mg by mouth 3 (three) times daily. Take after a meal along with Tylenol 500 mg x7 days for pain   NUTRITIONAL SUPPLEMENT LIQD    Take 120 mLs by mouth 2 (two) times daily.   OXYCODONE HCL 10 MG TABS    Take 1 tablet (10 mg total) by mouth every 4 (four) hours as needed for up to 30 doses (Pain).   POLYETHYLENE GLYCOL (MIRALAX / GLYCOLAX) PACKET    Take 17 g by mouth daily as needed for moderate constipation.   SENNA-DOCUSATE (SENOKOT-S) 8.6-50 MG TABLET    Take 2 tablets by mouth 2 (two) times daily.   VITAMIN C (VITAMIN C) 500 MG TABLET    Take 1 tablet (500 mg total) by mouth daily.  Modified Medications   No medications on file  Discontinued Medications   No medications on file    Subjective: Yolanda Watson is in with her husband, Yolanda Watson, for her routine follow-up visit.  She completed 8 weeks of antibiotic therapy for her  polymicrobial abdominal wound infection and septic arthritis of her symphysis pubis on 02/20/2018.  She continues to improve.  She is no longer having the sharp pain in her groin.  She is now able to walk without the aid of a cane or walker.  Her abdominal wound continues to heal.  She no longer has a wound VAC.  Her appetite has improved.  Review of Systems: Review of Systems  Constitutional: Negative for chills, diaphoresis, fever and weight loss.  Gastrointestinal: Negative for abdominal pain, diarrhea, nausea and vomiting.  Musculoskeletal: Negative for joint pain.       He has some soreness in her hips and thighs from doing housework on her hands and knees this past weekend but no more pain over her symphysis pubis or in her groin.    Past Medical History:  Diagnosis Date  . Acquired bilateral hand deformity    both hands with severe digit contracture. VERY limited ROM   . Alcohol abuse   . Colon cancer ([place]) 2016   metastatic to abdominal wall  . Port-A-Cath in place since 04-2017   right chest     Social History   Tobacco Use  . Smoking status: Current Every Day Smoker  Packs/day: 1.00    Years: 45.00    Pack years: 45.00    Types: Cigarettes  . Smokeless tobacco: Never Used  . Tobacco comment: She has not smoked since 12/29/17  Substance Use Topics  . Alcohol use: Yes    Alcohol/week: 1.2 - 2.4 oz    Types: 2 - 4 Shots of liquor per week    Comment: denies problem with ETOH but contineus to drink "a couple of drinks per day"  . Drug use: No    Family History  Problem Relation Age of Onset  . Heart disease Mother   . Stroke Maternal Aunt   . Cancer Neg Hx   . Diabetes Neg Hx     Allergies  Allergen Reactions  . Penicillins Other (See Comments)    Tolerated cefepime 01/01/2018 "PASSED OUT AS A CHILD" > ? SYNCOPE ? PATIENT HAS HAD A PCN REACTION WITH IMMEDIATE RASH, FACIAL/TONGUE/THROAT SWELLING, SOB, OR LIGHTHEADEDNESS WITH HYPOTENSION:  #  #  #  YES  #  #  #     Has patient had a PCN reaction causing severe rash involving mucus membranes or skin necrosis: No Has patient had a PCN reaction that required hospitalization: No Has patient had a PCN reaction occurring within the last 10 years: No   . Latex Itching and Rash    BED "CHUX PADS"    Objective: Vitals:   03/10/18 1024  BP: 133/80  Pulse: 89  Temp: 97.9 F (36.6 C)  TempSrc: Oral  Weight: 103 lb 4 oz (46.8 kg)  Height: 5\' 6"  (1.676 m)   Body mass index is 16.66 kg/m.  Physical Exam  Constitutional: She is oriented to person, place, and time.  She is in good spirits as usual.  Abdominal: Soft. She exhibits no distension.  Wound is much improved.  She has a central area of erythema that measures about 2 x 3 inches.  There is a small amount of drainage on her gauze dressing.  There is no undermining or any sinus tracts.  Neurological: She is alert and oriented to person, place, and time.  Skin: No rash noted.  Psychiatric: Mood and affect normal.    Lab Results    Problem List Items Addressed This Visit      High   Acute osteomyelitis of pelvic region [facility])    She continues to improve 2-1/2 weeks after completing antibiotic therapy for her polymicrobial wound infection and septic arthritis of the symphysis pubis.  She will stay off of antibiotics and follow-up here in 4 weeks.          Michel Bickers, MD [facility] for Infectious [place] [facility] [phone] pager   [phone] cell 03/10/2018, 11:00 AM

## 2018-03-10 NOTE — Assessment & Plan Note (Signed)
She continues to improve 2-1/2 weeks after completing antibiotic therapy for her polymicrobial wound infection and septic arthritis of the symphysis pubis.  She will stay off of antibiotics and follow-up here in 4 weeks.

## 2018-03-11 ENCOUNTER — Telehealth: Payer: Self-pay | Admitting: *Deleted

## 2018-03-11 DIAGNOSIS — C774 Secondary and unspecified malignant neoplasm of inguinal and lower limb lymph nodes: Secondary | ICD-10-CM | POA: Diagnosis not present

## 2018-03-11 DIAGNOSIS — S31109D Unspecified open wound of abdominal wall, unspecified quadrant without penetration into peritoneal cavity, subsequent encounter: Secondary | ICD-10-CM | POA: Diagnosis not present

## 2018-03-11 DIAGNOSIS — C189 Malignant neoplasm of colon, unspecified: Secondary | ICD-10-CM | POA: Diagnosis not present

## 2018-03-11 NOTE — Telephone Encounter (Signed)
"  On 02-09-2018 I was unable to come in due to surgery.  Please reschedule what ever I was supposed to have.  (Past appointments for Lab/Flush/30 min MD F/U.)  I will not be available March 26 - 27, 2019.  Also need to be seen before April 28, 2018.  My Medicaid runs out April 28, 2018."  Scheduling message sent with patient request.

## 2018-03-23 DIAGNOSIS — C44509 Unspecified malignant neoplasm of skin of other part of trunk: Secondary | ICD-10-CM | POA: Diagnosis not present

## 2018-03-25 DIAGNOSIS — C774 Secondary and unspecified malignant neoplasm of inguinal and lower limb lymph nodes: Secondary | ICD-10-CM | POA: Diagnosis not present

## 2018-03-25 DIAGNOSIS — C189 Malignant neoplasm of colon, unspecified: Secondary | ICD-10-CM | POA: Diagnosis not present

## 2018-03-25 DIAGNOSIS — S31109D Unspecified open wound of abdominal wall, unspecified quadrant without penetration into peritoneal cavity, subsequent encounter: Secondary | ICD-10-CM | POA: Diagnosis not present

## 2018-04-07 ENCOUNTER — Inpatient Hospital Stay: Payer: Medicare Other | Admitting: Internal Medicine

## 2018-04-08 DIAGNOSIS — C189 Malignant neoplasm of colon, unspecified: Secondary | ICD-10-CM | POA: Diagnosis not present

## 2018-04-08 DIAGNOSIS — C774 Secondary and unspecified malignant neoplasm of inguinal and lower limb lymph nodes: Secondary | ICD-10-CM | POA: Diagnosis not present

## 2018-04-08 DIAGNOSIS — S31109D Unspecified open wound of abdominal wall, unspecified quadrant without penetration into peritoneal cavity, subsequent encounter: Secondary | ICD-10-CM | POA: Diagnosis not present

## 2018-04-08 DIAGNOSIS — Z9221 Personal history of antineoplastic chemotherapy: Secondary | ICD-10-CM | POA: Diagnosis not present

## 2018-04-08 DIAGNOSIS — M869 Osteomyelitis, unspecified: Secondary | ICD-10-CM | POA: Diagnosis not present

## 2018-04-08 DIAGNOSIS — Z923 Personal history of irradiation: Secondary | ICD-10-CM | POA: Diagnosis not present

## 2018-04-13 ENCOUNTER — Inpatient Hospital Stay: Payer: Medicare Other | Attending: Oncology

## 2018-04-13 ENCOUNTER — Inpatient Hospital Stay: Payer: Medicare Other

## 2018-04-13 ENCOUNTER — Inpatient Hospital Stay (HOSPITAL_BASED_OUTPATIENT_CLINIC_OR_DEPARTMENT_OTHER): Payer: Medicare Other | Admitting: Oncology

## 2018-04-13 ENCOUNTER — Telehealth: Payer: Self-pay

## 2018-04-13 VITALS — BP 115/68 | HR 86 | Temp 97.9°F | Resp 17 | Ht 66.0 in | Wt 108.5 lb

## 2018-04-13 DIAGNOSIS — Z85038 Personal history of other malignant neoplasm of large intestine: Secondary | ICD-10-CM

## 2018-04-13 DIAGNOSIS — C182 Malignant neoplasm of ascending colon: Secondary | ICD-10-CM

## 2018-04-13 DIAGNOSIS — Z9221 Personal history of antineoplastic chemotherapy: Secondary | ICD-10-CM

## 2018-04-13 DIAGNOSIS — C189 Malignant neoplasm of colon, unspecified: Secondary | ICD-10-CM

## 2018-04-13 DIAGNOSIS — Z923 Personal history of irradiation: Secondary | ICD-10-CM | POA: Diagnosis not present

## 2018-04-13 DIAGNOSIS — Z95828 Presence of other vascular implants and grafts: Secondary | ICD-10-CM

## 2018-04-13 LAB — CEA (IN HOUSE-CHCC)

## 2018-04-13 MED ORDER — HEPARIN SOD (PORK) LOCK FLUSH 100 UNIT/ML IV SOLN
500.0000 [IU] | Freq: Once | INTRAVENOUS | Status: AC | PRN
  Administered 2018-04-13: 500 [IU] via INTRAVENOUS
  Filled 2018-04-13: qty 5

## 2018-04-13 MED ORDER — SODIUM CHLORIDE 0.9% FLUSH
10.0000 mL | INTRAVENOUS | Status: DC | PRN
  Administered 2018-04-13: 10 mL via INTRAVENOUS
  Filled 2018-04-13: qty 10

## 2018-04-13 NOTE — Progress Notes (Signed)
[facility] OFFICE PROGRESS NOTE   Diagnosis: Colon cancer  INTERVAL HISTORY:   [name] was admitted in January with an abdominal wall abscess and osteomyelitis of the pubis.  She underwent a debridement procedure 01/01/2018 and was discharged to a skilled nursing facility with a wound VAC in place.  She completed antibiotics and reports the wound is healing.  She is followed by Dr. Barry Dienes and [name]. She feels well at present. Objective:  Vital signs in last 24 hours:  Blood pressure 115/68, pulse 86, temperature 97.9 F (36.6 C), temperature source Oral, resp. rate 17, height _0  (1.676 m), weight 108 lb 8 oz (49.2 kg), SpO2 98 %.    HEENT: Neck without mass Lymphatics: No cervical, supraclavicular, axillary, or inguinal nodes Resp: Lungs clear bilaterally Cardio: Regular rate and rhythm GI: No hepatosplenomegaly, no mass, nontender Vascular: No leg edema  Skin: Lower abdominal wound with remaining superficial ulceration.  Portacath/PICC-without erythema  Lab Results:  Lab Results  Component Value Date   WBC 8.6 02/05/2018   HGB 12.3 02/05/2018   HCT 38.7 02/05/2018   MCV 86.2 02/05/2018   PLT 368 02/05/2018   NEUTROABS 4 01/15/2018    CMP     Component Value Date/Time   NA 139 02/05/2018 1144   NA 141 01/15/2018   NA 138 08/07/2017 0835   K 5.3 02/05/2018 1144   K 4.1 08/07/2017 0835   CL 106 02/05/2018 1144   CO2 23 02/05/2018 1144   CO2 25 08/07/2017 0835   GLUCOSE 118 (H) 02/05/2018 1144   GLUCOSE 95 08/07/2017 0835   BUN 10 02/05/2018 1144   BUN 4 01/15/2018   BUN 9.3 08/07/2017 0835   CREATININE 0.57 02/05/2018 1144   CREATININE 0.7 08/07/2017 0835   CALCIUM 9.5 02/05/2018 1144   CALCIUM 9.4 08/07/2017 0835   PROT 6.2 (L) 12/30/2017 0837   PROT 7.3 08/07/2017 0835   ALBUMIN 2.5 (L) 12/30/2017 0837   ALBUMIN 3.4 (L) 08/07/2017 0835   AST 23 12/30/2017 0837   AST 25 08/07/2017 0835   ALT 18 12/30/2017 0837   ALT 21  08/07/2017 0835   ALKPHOS 128 (H) 12/30/2017 0837   ALKPHOS 138 08/07/2017 0835   BILITOT 0.5 12/30/2017 0837   BILITOT 0.49 08/07/2017 0835   GFRNONAA >60 01/06/2018 0441   GFRAA >60 01/06/2018 0441    Lab Results  Component Value Date   CEA1 4.21 05/05/2017     Medications: I have reviewed the patient's current medications.   Assessment/Plan:  1. Metastatic colon cancer presenting with an Abdominal/pelvic mass  2. [place] in [place]-pathology report dated 02/15/2015-invasive adenocarcinoma, well-differentiated with prominent mucinous features; microscopic tumor extension into mesenteric adipose tissue and focally involving visceral peritoneum; radial and mucosal margins negative; lymph-vascular invasion withsuspicious foci identified;perineural invasion not identified; 16 negative lymph nodes; tumor deposits not identified; pT4apN0;MSI stable  ? CT 04/22/2017 revealed a heterogenous anterior pelvic wall mass, right inguinal mass, top normal sized retroperitoneal and iliac nodes, abutment of the anterior dome of the bladder and a loop of small bowel ? 04/29/2017 biopsy of anterior abdominal/pelvic wall mass-acellular mucin dissecting fibrous stroma ? Cycle 1 FOLFOX 05/15/2017 ? Cycle 2 FOLFOX 05/28/2017 ? Cycle 3 FOLFOX with Avastin on 06/11/2017 ? Cycle 4 FOLFOX/Avastin 06/25/2017 ? Cycle 5 FOLFOX/Avastin 07/10/2017, Neulasta added ? Restaging CT 07/21/2017-mild decrease in the size of a right inguinal node and the anterior abdominal wall mass ? Radiation to the abdominal wall mass  08/18/2017-09/03/2017 ? Status post excision of abdominal wall mass 10/03/2017--pathology showed adenocarcinoma with excessive extracellular mucin. Margins negative for tumor. Right inguinal wall mass also excised showing metastatic adenocarcinoma with excessive extracellular mucin in 1 of 8 lymph nodes.   3. Ongoing tobacco and alcohol use  4. Mild neutropenia  related to chemotherapy 07/10/2017. Neulasta added.  5.  Admission January 2019 with an abdominal wall abscess and pubis osteomyelitis, status post debridement and antibiotics   Disposition: Yol[facility] Watson is in clinical remission from colon cancer.  She is recovering from a wound infection/osteomyelitis.  The wound appears almost completely healed.  No clinical evidence of progressive colon cancer.  She will return for a Port-A-Cath flush in 6 weeks and an office visit in 3 months.  15 minutes were spent with the patient today.  The majority of the time was used for counseling and coordination of care.  Betsy Coder, MD  04/13/2018  3:52 PM

## 2018-04-13 NOTE — Telephone Encounter (Signed)
Printed avs and calender of upcoming appointment. Per 4/15 los 

## 2018-04-14 ENCOUNTER — Encounter: Payer: Self-pay | Admitting: Internal Medicine

## 2018-04-14 ENCOUNTER — Telehealth: Payer: Self-pay | Admitting: Nurse Practitioner

## 2018-04-14 ENCOUNTER — Ambulatory Visit (INDEPENDENT_AMBULATORY_CARE_PROVIDER_SITE_OTHER): Payer: Medicare Other | Admitting: Internal Medicine

## 2018-04-14 DIAGNOSIS — M86159 Other acute osteomyelitis, unspecified femur: Secondary | ICD-10-CM

## 2018-04-14 NOTE — Progress Notes (Signed)
[facility] for Infectious Disease  Patient Active Problem List   Diagnosis Date Noted  . Abdominal wall abscess at site of surgical wound     Priority: High  . Acute osteomyelitis of pelvic region [facility])     Priority: High  . At risk for adverse drug event 01/13/2018  . Anemia, unspecified 01/11/2018  . Abdominal pain, lower   . Encounter for palliative care   . Open wound of abdomen 11/14/2017  . Port-A-Cath in place 10/21/2017  . Local recurrence of colon cancer ([place]) 10/03/2017  . Secondary malignancy of inguinal lymph nodes ([place]) 07/29/2017  . Cancer of right colon ([place]) 05/05/2017    Patient's Medications  New Prescriptions   No medications on file  Previous Medications   ACETAMINOPHEN (TYLENOL) 500 MG TABLET    Take 500 mg by mouth every 8 (eight) hours. Take with naproxen   FEEDING SUPPLEMENT, ENSURE ENLIVE, (ENSURE ENLIVE) LIQD    Take 237 mLs by mouth 2 (two) times daily between meals.   METHOCARBAMOL (ROBAXIN) 500 MG TABLET    Take 1 tablet (500 mg total) by mouth 3 (three) times daily.   NAPROXEN SODIUM (ALEVE) 220 MG TABLET    Take 220 mg by mouth 3 (three) times daily. Take after a meal along with Tylenol 500 mg x7 days for pain   NUTRITIONAL SUPPLEMENT LIQD    Take 120 mLs by mouth 2 (two) times daily.   OXYCODONE HCL 10 MG TABS    Take 1 tablet (10 mg total) by mouth every 4 (four) hours as needed for up to 30 doses (Pain).   POLYETHYLENE GLYCOL (MIRALAX / GLYCOLAX) PACKET    Take 17 g by mouth daily as needed for moderate constipation.   SENNA-DOCUSATE (SENOKOT-S) 8.6-50 MG TABLET    Take 2 tablets by mouth 2 (two) times daily.   VITAMIN C (VITAMIN C) 500 MG TABLET    Take 1 tablet (500 mg total) by mouth daily.  Modified Medications   No medications on file  Discontinued Medications   No medications on file    Subjective: Yolanda Watson is in with her husband, Yolanda Watson, for her routine follow-up visit.  She completed 8 weeks of antibiotic therapy for her  polymicrobial abdominal wound infection and septic arthritis of her symphysis pubis on 02/20/2018.  She continues to improve.  Her pelvic and groin pain has resolved completely and she has resumed most of her usual activities.  Her abdominal wound continues to heal.  Her appetite has improved.  Review of Systems: Review of Systems  Constitutional: Negative for chills, diaphoresis, fever and weight loss.  Gastrointestinal: Negative for abdominal pain, diarrhea, nausea and vomiting.  Musculoskeletal: Negative for joint pain.    Past Medical History:  Diagnosis Date  . Acquired bilateral hand deformity    both hands with severe digit contracture. VERY limited ROM   . Alcohol abuse   . Colon cancer ([place]) 2016   metastatic to abdominal wall  . Port-A-Cath in place since 04-2017   right chest     Social History   Tobacco Use  . Smoking status: Current Every Day Smoker    Packs/day: 1.00    Years: 45.00    Pack years: 45.00    Types: Cigarettes  . Smokeless tobacco: Never Used  . Tobacco comment: She has not smoked since 12/29/17  Substance Use Topics  . Alcohol use: Yes    Alcohol/week: 1.2 - 2.4 oz  Types: 2 - 4 Shots of liquor per week    Comment: denies problem with ETOH but contineus to drink "a couple of drinks per day"  . Drug use: No    Family History  Problem Relation Age of Onset  . Heart disease Mother   . Stroke Maternal Aunt   . Cancer Neg Hx   . Diabetes Neg Hx     Allergies  Allergen Reactions  . Penicillins Other (See Comments)    Tolerated cefepime 01/01/2018 "PASSED OUT AS A CHILD" > ? SYNCOPE ? PATIENT HAS HAD A PCN REACTION WITH IMMEDIATE RASH, FACIAL/TONGUE/THROAT SWELLING, SOB, OR LIGHTHEADEDNESS WITH HYPOTENSION:  #  #  #  YES  #  #  #   Has patient had a PCN reaction causing severe rash involving mucus membranes or skin necrosis: No Has patient had a PCN reaction that required hospitalization: No Has patient had a PCN reaction occurring within the  last 10 years: No   . Latex Itching and Rash    BED "CHUX PADS"    Objective: Vitals:   04/14/18 0948  BP: 119/75  Pulse: 84  Temp: 97.6 F (36.4 C)  TempSrc: Oral  Weight: 107 lb (48.5 kg)  Height: 5\' 6"  (1.676 m)   Body mass index is 17.27 kg/m.  Physical Exam  Constitutional: She is oriented to person, place, and time.  She appears to be feeling much better.  She is in very good spirits as usual.  Abdominal: Soft. She exhibits no distension.  Her lower abdominal wound is looking much better.  There is only a small open area remaining.  There is a small amount of drainage on her gauze dressing.  There is no undermining or any sinus tracts.  Musculoskeletal:  She has no pain over her symphysis pubis.  She rises from a seated position without difficulty and has a normal gait.  Neurological: She is alert and oriented to person, place, and time.  Skin: No rash noted.  Psychiatric: Mood and affect normal.    Lab Results    Problem List Items Addressed This Visit      High   Acute osteomyelitis of pelvic region [facility])    He continues to improve nearly 2 months after completing antibiotic therapy for septic arthritis and osteomyelitis of the symphysis pubis and complex abdominal wound infection.  She will continue off of antibiotics.  She can follow-up here as needed.          Michel Bickers, MD [facility] for Infectious [place] [facility] [phone] pager   [phone] cell 04/14/2018, 10:04 AM

## 2018-04-14 NOTE — Telephone Encounter (Signed)
Patient called about her schedule Lattie Haw was on the wrong day

## 2018-04-14 NOTE — Assessment & Plan Note (Signed)
He continues to improve nearly 2 months after completing antibiotic therapy for septic arthritis and osteomyelitis of the symphysis pubis and complex abdominal wound infection.  She will continue off of antibiotics.  She can follow-up here as needed.

## 2018-04-24 DIAGNOSIS — L02211 Cutaneous abscess of abdominal wall: Secondary | ICD-10-CM | POA: Diagnosis not present

## 2018-04-24 DIAGNOSIS — Z720 Tobacco use: Secondary | ICD-10-CM | POA: Diagnosis not present

## 2018-04-24 DIAGNOSIS — M6281 Muscle weakness (generalized): Secondary | ICD-10-CM | POA: Diagnosis not present

## 2018-04-24 DIAGNOSIS — C189 Malignant neoplasm of colon, unspecified: Secondary | ICD-10-CM | POA: Diagnosis not present

## 2018-04-29 DIAGNOSIS — S31109D Unspecified open wound of abdominal wall, unspecified quadrant without penetration into peritoneal cavity, subsequent encounter: Secondary | ICD-10-CM | POA: Diagnosis not present

## 2018-04-29 DIAGNOSIS — C774 Secondary and unspecified malignant neoplasm of inguinal and lower limb lymph nodes: Secondary | ICD-10-CM | POA: Diagnosis not present

## 2018-04-29 DIAGNOSIS — Z9221 Personal history of antineoplastic chemotherapy: Secondary | ICD-10-CM | POA: Diagnosis not present

## 2018-04-29 DIAGNOSIS — Z85038 Personal history of other malignant neoplasm of large intestine: Secondary | ICD-10-CM | POA: Diagnosis not present

## 2018-05-20 DIAGNOSIS — L905 Scar conditions and fibrosis of skin: Secondary | ICD-10-CM | POA: Diagnosis not present

## 2018-05-20 DIAGNOSIS — C774 Secondary and unspecified malignant neoplasm of inguinal and lower limb lymph nodes: Secondary | ICD-10-CM | POA: Diagnosis not present

## 2018-05-20 DIAGNOSIS — Z85038 Personal history of other malignant neoplasm of large intestine: Secondary | ICD-10-CM | POA: Diagnosis not present

## 2018-05-20 DIAGNOSIS — Z9221 Personal history of antineoplastic chemotherapy: Secondary | ICD-10-CM | POA: Diagnosis not present

## 2018-05-26 ENCOUNTER — Inpatient Hospital Stay: Payer: Medicare Other | Attending: Oncology

## 2018-05-26 ENCOUNTER — Ambulatory Visit: Payer: Medicare Other | Admitting: Nurse Practitioner

## 2018-05-26 VITALS — BP 124/76 | HR 89 | Temp 98.2°F | Resp 18

## 2018-05-26 DIAGNOSIS — C189 Malignant neoplasm of colon, unspecified: Secondary | ICD-10-CM

## 2018-05-26 DIAGNOSIS — Z85038 Personal history of other malignant neoplasm of large intestine: Secondary | ICD-10-CM | POA: Diagnosis not present

## 2018-05-26 DIAGNOSIS — Z452 Encounter for adjustment and management of vascular access device: Secondary | ICD-10-CM | POA: Diagnosis not present

## 2018-05-26 DIAGNOSIS — Z95828 Presence of other vascular implants and grafts: Secondary | ICD-10-CM

## 2018-05-26 MED ORDER — HEPARIN SOD (PORK) LOCK FLUSH 100 UNIT/ML IV SOLN
500.0000 [IU] | Freq: Once | INTRAVENOUS | Status: AC | PRN
  Administered 2018-05-26: 500 [IU] via INTRAVENOUS
  Filled 2018-05-26: qty 5

## 2018-05-26 MED ORDER — SODIUM CHLORIDE 0.9% FLUSH
10.0000 mL | INTRAVENOUS | Status: DC | PRN
  Administered 2018-05-26: 10 mL via INTRAVENOUS
  Filled 2018-05-26: qty 10

## 2018-05-26 NOTE — Patient Instructions (Signed)
Implanted Port Home Guide An implanted port is a type of central line that is placed under the skin. Central lines are used to provide IV access when treatment or nutrition needs to be given through a person's veins. Implanted ports are used for long-term IV access. An implanted port may be placed because:  You need IV medicine that would be irritating to the small veins in your hands or arms.  You need long-term IV medicines, such as antibiotics.  You need IV nutrition for a long period.  You need frequent blood draws for lab tests.  You need dialysis.  Implanted ports are usually placed in the chest area, but they can also be placed in the upper arm, the abdomen, or the leg. An implanted port has two main parts:  Reservoir. The reservoir is round and will appear as a small, raised area under your skin. The reservoir is the part where a needle is inserted to give medicines or draw blood.  Catheter. The catheter is a thin, flexible tube that extends from the reservoir. The catheter is placed into a large vein. Medicine that is inserted into the reservoir goes into the catheter and then into the vein.  How will I care for my incision site? Do not get the incision site wet. Bathe or shower as directed by your health care provider. How is my port accessed? Special steps must be taken to access the port:  Before the port is accessed, a numbing cream can be placed on the skin. This helps numb the skin over the port site.  Your health care provider uses a sterile technique to access the port. ? Your health care provider must put on a mask and sterile gloves. ? The skin over your port is cleaned carefully with an antiseptic and allowed to dry. ? The port is gently pinched between sterile gloves, and a needle is inserted into the port.  Only "non-coring" port needles should be used to access the port. Once the port is accessed, a blood return should be checked. This helps ensure that the port  is in the vein and is not clogged.  If your port needs to remain accessed for a constant infusion, a clear (transparent) bandage will be placed over the needle site. The bandage and needle will need to be changed every week, or as directed by your health care provider.  Keep the bandage covering the needle clean and dry. Do not get it wet. Follow your health care provider's instructions on how to take a shower or bath while the port is accessed.  If your port does not need to stay accessed, no bandage is needed over the port.  What is flushing? Flushing helps keep the port from getting clogged. Follow your health care provider's instructions on how and when to flush the port. Ports are usually flushed with saline solution or a medicine called heparin. The need for flushing will depend on how the port is used.  If the port is used for intermittent medicines or blood draws, the port will need to be flushed: ? After medicines have been given. ? After blood has been drawn. ? As part of routine maintenance.  If a constant infusion is running, the port may not need to be flushed.  How long will my port stay implanted? The port can stay in for as long as your health care provider thinks it is needed. When it is time for the port to come out, surgery will be   done to remove it. The procedure is similar to the one performed when the port was put in. When should I seek immediate medical care? When you have an implanted port, you should seek immediate medical care if:  You notice a bad smell coming from the incision site.  You have swelling, redness, or drainage at the incision site.  You have more swelling or pain at the port site or the surrounding area.  You have a fever that is not controlled with medicine.  This information is not intended to replace advice given to you by your health care provider. Make sure you discuss any questions you have with your health care provider. Document  Released: 12/16/2005 Document Revised: 05/23/2016 Document Reviewed: 08/23/2013 Elsevier Interactive Patient Education  2017 Elsevier Inc.  

## 2018-07-07 ENCOUNTER — Encounter: Payer: Self-pay | Admitting: Nurse Practitioner

## 2018-07-07 ENCOUNTER — Telehealth: Payer: Self-pay | Admitting: Oncology

## 2018-07-07 ENCOUNTER — Inpatient Hospital Stay (HOSPITAL_BASED_OUTPATIENT_CLINIC_OR_DEPARTMENT_OTHER): Payer: Medicare Other | Admitting: Nurse Practitioner

## 2018-07-07 ENCOUNTER — Inpatient Hospital Stay: Payer: Medicare Other | Attending: Oncology

## 2018-07-07 VITALS — BP 133/72 | HR 69 | Temp 97.9°F | Resp 18 | Ht 66.0 in | Wt 109.5 lb

## 2018-07-07 DIAGNOSIS — C182 Malignant neoplasm of ascending colon: Secondary | ICD-10-CM | POA: Diagnosis not present

## 2018-07-07 DIAGNOSIS — Z95828 Presence of other vascular implants and grafts: Secondary | ICD-10-CM

## 2018-07-07 DIAGNOSIS — C189 Malignant neoplasm of colon, unspecified: Secondary | ICD-10-CM

## 2018-07-07 MED ORDER — HEPARIN SOD (PORK) LOCK FLUSH 100 UNIT/ML IV SOLN
500.0000 [IU] | Freq: Once | INTRAVENOUS | Status: AC | PRN
  Administered 2018-07-07: 500 [IU] via INTRAVENOUS
  Filled 2018-07-07: qty 5

## 2018-07-07 MED ORDER — SODIUM CHLORIDE 0.9% FLUSH
10.0000 mL | INTRAVENOUS | Status: DC | PRN
  Administered 2018-07-07: 10 mL via INTRAVENOUS
  Filled 2018-07-07: qty 10

## 2018-07-07 NOTE — Telephone Encounter (Signed)
Scheduled appt per 7/9 los- gave patient avs and calender per los.

## 2018-07-07 NOTE — Progress Notes (Addendum)
[facility] OFFICE PROGRESS NOTE   Diagnosis: Colon cancer  INTERVAL HISTORY:   Yol[facility] Watson returns as scheduled.  She feels well.  She has a good appetite and good energy level.  She denies pain.  No fever.  Bowels are moving.  Objective:  Vital signs in last 24 hours:  Blood pressure 133/72, pulse 69, temperature 97.9 F (36.6 C), temperature source Oral, resp. rate 18, height _0  (1.676 m), weight 109 lb 8 oz (49.7 kg), SpO2 100 %.    HEENT: Neck without mass. Lymphatics: No palpable cervical, supraclavicular, axillary or right inguinal lymph nodes.  Firm 1-1.5 cm left inguinal lymph node. Resp: Lungs clear bilaterally. Cardio: Regular rate and rhythm. GI: Abdomen is soft.  No hepatomegaly.  Fullness at the left lower quadrant. Vascular: No leg edema. Skin: Lower abdominal wound with very superficial scab at the lower aspect. Port-A-Cath without erythema.  Lab Results:  Lab Results  Component Value Date   WBC 8.6 02/05/2018   HGB 12.3 02/05/2018   HCT 38.7 02/05/2018   MCV 86.2 02/05/2018   PLT 368 02/05/2018   NEUTROABS 4 01/15/2018    Imaging:  No results found.  Medications: I have reviewed the patient's current medications.  Assessment/Plan: 1. Metastatic colon cancer presenting with an Abdominal/pelvic mass  2. Colon cancerdiagnosed in [place] in 2016-pathology report dated 02/15/2015-invasive adenocarcinoma, well-differentiated with prominent mucinous features; microscopic tumor extension into mesenteric adipose tissue and focally involving visceral peritoneum; radial and mucosal margins negative; lymph-vascular invasion withsuspicious foci identified;perineural invasion not identified; 16 negative lymph nodes; tumor deposits not identified; pT4apN0;MSI stable  ? CT 04/22/2017 revealed a heterogenous anterior pelvic wall mass, right inguinal mass, top normal sized retroperitoneal and iliac nodes, abutment of the anterior dome of the  bladder and a loop of small bowel ? 04/29/2017 biopsy of anterior abdominal/pelvic wall mass-acellular mucin dissecting fibrous stroma ? Cycle 1 FOLFOX 05/15/2017 ? Cycle 2 FOLFOX 05/28/2017 ? Cycle 3 FOLFOX with Avastin on 06/11/2017 ? Cycle 4 FOLFOX/Avastin 06/25/2017 ? Cycle 5 FOLFOX/Avastin 07/10/2017, Neulasta added ? Restaging CT 07/21/2017-mild decrease in the size of a right inguinal node and the anterior abdominal wall mass ? Radiation to the abdominal wall mass 08/18/2017-09/03/2017 ? Status post excision of abdominal wall mass 10/03/2017--pathology showed adenocarcinoma with excessive extracellular mucin. Margins negative for tumor. Right inguinal wall mass also excised showing metastatic adenocarcinoma with excessive extracellular mucin in 1 of 8 lymph nodes.   3. Ongoing tobacco and alcohol use  4. Mild neutropenia related to chemotherapy 07/10/2017. Neulasta added.  5.  Admission January 2019 with an abdominal wall abscess and pubis osteomyelitis, status post debridement and antibiotics    Disposition: Yolanda Watson appears unchanged.  On exam she has a palpable left inguinal lymph node.  She understands this could be related to the colon cancer.  She agrees to return for a follow-up visit in 6 weeks to reevaluate.  She will also have a Port-A-Cath flush and CEA checked at that time.  She will contact the office in the interim with any problems.  Patient seen with Dr. Benay Spice.    Ned Card ANP/GNP-BC   07/07/2018  10:48 AM  This was a shared visit with Ned Card.  Yol[facility] Watson was interviewed and examined.  There is a firm left inguinal lymph node on exam today.  This could represent progression of colon cancer versus a reactive lymph node related to the recent extensive surgery.  Julieanne Manson, MD

## 2018-08-18 ENCOUNTER — Inpatient Hospital Stay: Payer: Medicare Other

## 2018-08-18 ENCOUNTER — Encounter: Payer: Self-pay | Admitting: Oncology

## 2018-08-18 ENCOUNTER — Telehealth: Payer: Self-pay

## 2018-08-18 ENCOUNTER — Inpatient Hospital Stay: Payer: Medicare Other | Attending: Oncology | Admitting: Oncology

## 2018-08-18 VITALS — BP 140/85 | HR 82 | Temp 97.6°F | Resp 18 | Ht 66.0 in | Wt 111.5 lb

## 2018-08-18 DIAGNOSIS — C182 Malignant neoplasm of ascending colon: Secondary | ICD-10-CM

## 2018-08-18 DIAGNOSIS — Z85038 Personal history of other malignant neoplasm of large intestine: Secondary | ICD-10-CM | POA: Diagnosis not present

## 2018-08-18 DIAGNOSIS — C189 Malignant neoplasm of colon, unspecified: Secondary | ICD-10-CM

## 2018-08-18 DIAGNOSIS — Z9221 Personal history of antineoplastic chemotherapy: Secondary | ICD-10-CM

## 2018-08-18 DIAGNOSIS — R590 Localized enlarged lymph nodes: Secondary | ICD-10-CM

## 2018-08-18 DIAGNOSIS — Z95828 Presence of other vascular implants and grafts: Secondary | ICD-10-CM

## 2018-08-18 LAB — CEA (IN HOUSE-CHCC): CEA (CHCC-IN HOUSE): 1.16 ng/mL (ref 0.00–5.00)

## 2018-08-18 MED ORDER — HEPARIN SOD (PORK) LOCK FLUSH 100 UNIT/ML IV SOLN
500.0000 [IU] | Freq: Once | INTRAVENOUS | Status: AC | PRN
  Administered 2018-08-18: 500 [IU] via INTRAVENOUS
  Filled 2018-08-18: qty 5

## 2018-08-18 MED ORDER — SODIUM CHLORIDE 0.9% FLUSH
10.0000 mL | INTRAVENOUS | Status: DC | PRN
  Administered 2018-08-18: 10 mL via INTRAVENOUS
  Filled 2018-08-18: qty 10

## 2018-08-18 NOTE — Telephone Encounter (Signed)
Printed avs and calender of upcoming appointment. Per 8/20 los 

## 2018-08-18 NOTE — Progress Notes (Signed)
[place] OFFICE PROGRESS NOTE   Diagnosis: Colon cancer  INTERVAL HISTORY:   [name] returns for a scheduled visit.  She feels well.  No change in the left inguinal nodule.  No new complaint.  Objective:  Vital signs in last 24 hours:  Blood pressure 140/85, pulse 82, temperature 97.6 F (36.4 C), temperature source Oral, resp. rate 18, height '5\' 6"'$  (1.676 m), weight 111 lb 8 oz (50.6 kg), SpO2 100 %.    HEENT: Neck without mass Lymphatics: No cervical, supraclavicular, axillary, or right inguinal nodes.  Firm 1-1.5 cm left inguinal node Resp: Lungs clear bilaterally Cardio: Regular rate and rhythm GI: No hepatomegaly, no mass, healed surgical incision Vascular: No leg edema   Portacath/PICC-without erythema  Lab Results:  Lab Results  Component Value Date   WBC 8.6 02/05/2018   HGB 12.3 02/05/2018   HCT 38.7 02/05/2018   MCV 86.2 02/05/2018   PLT 368 02/05/2018   NEUTROABS 4 01/15/2018    CMP  Lab Results  Component Value Date   NA 139 02/05/2018   K 5.3 02/05/2018   CL 106 02/05/2018   CO2 23 02/05/2018   GLUCOSE 118 (H) 02/05/2018   BUN 10 02/05/2018   CREATININE 0.57 02/05/2018   CALCIUM 9.5 02/05/2018   PROT 6.2 (L) 12/30/2017   ALBUMIN 2.5 (L) 12/30/2017   AST 23 12/30/2017   ALT 18 12/30/2017   ALKPHOS 128 (H) 12/30/2017   BILITOT 0.5 12/30/2017   GFRNONAA >60 01/06/2018   GFRAA >60 01/06/2018    Lab Results  Component Value Date   CEA1 <1.00 04/13/2018     Medications: I have reviewed the patient's current medications.   Assessment/Plan:  1. Metastatic colon cancer presenting with an Abdominal/pelvic mass  2. [place]-pathology report dated 02/15/2015-invasive adenocarcinoma, well-differentiated with prominent mucinous features; microscopic tumor extension into mesenteric adipose tissue and focally involving visceral peritoneum; radial and mucosal margins negative;  lymph-vascular invasion withsuspicious foci identified;perineural invasion not identified; 16 negative lymph nodes; tumor deposits not identified; pT4apN0;MSI stable  ? CT 04/22/2017 revealed a heterogenous anterior pelvic wall mass, right inguinal mass, top normal sized retroperitoneal and iliac nodes, abutment of the anterior dome of the bladder and a loop of small bowel ? 04/29/2017 biopsy of anterior abdominal/pelvic wall mass-acellular mucin dissecting fibrous stroma ? Cycle 1 FOLFOX 05/15/2017 ? Cycle 2 FOLFOX 05/28/2017 ? Cycle 3 FOLFOX with Avastin on 06/11/2017 ? Cycle 4 FOLFOX/Avastin 06/25/2017 ? Cycle 5 FOLFOX/Avastin 07/10/2017, Neulasta added ? Restaging CT 07/21/2017-mild decrease in the size of a right inguinal node and the anterior abdominal wall mass ? Radiation to the abdominal wall mass 08/18/2017-09/03/2017 ? Status post excision of abdominal wall mass 10/03/2017--pathology showed adenocarcinoma with excessive extracellular mucin. Margins negative for tumor. Right inguinal wall mass also excised showing metastatic adenocarcinoma with excessive extracellular mucin in 1 of 8 lymph nodes.   3. Ongoing tobacco and alcohol use  4. Mild neutropenia related to chemotherapy 07/10/2017. Neulasta added.  5.Admission January 2019 with an abdominal wall abscess and pubis osteomyelitis, status post debridement and antibiotics     Disposition: [name]  appears well.  The firm lymph node in the left groin is unchanged.  This may represent a metastasis or scarring.  There is no other clinical evidence of disease progression.  We will follow-up on the CEA from today.  Ms. Schreurs will see Dr. Barry Dienes within the next 1-2 months.  She will return for an office  visit here in 3 months.  A Port-A-Cath remains in place.  She will return for a Port-A-Cath flush in 6 weeks.  15 minutes were spent with the patient today.  The majority of the time was used for counseling and  coordination of care.  Betsy Coder, MD  08/18/2018  11:26 AM

## 2018-08-20 ENCOUNTER — Telehealth: Payer: Self-pay

## 2018-08-20 NOTE — Telephone Encounter (Addendum)
Pt voiced understanding of message below   ----- Message from Ladell Pier, MD sent at 08/18/2018  1:43 PM EDT ----- Please call patient, cea is normal

## 2018-09-26 IMAGING — CT CT ABD-PELV W/ CM
2 of 5 series · 14 of 46 positions shown, 16 images · IV contrast (Omni 300)
Comparison: None.

CLINICAL DATA: 65-year-old female with abdominal mass and pain for
6 months.

EXAM:
CT ABDOMEN AND PELVIS WITH CONTRAST
TECHNIQUE: Multidetector CT imaging of the abdomen and pelvis was performed
using the standard protocol following bolus administration of
intravenous contrast.
CONTRAST:  100mL T6CHI9-5XX IOPAMIDOL (T6CHI9-5XX) INJECTION 61%

[Series 3: a/p w/ 5mm · axial · 0.78mm/px · z∈[+679,+1104]mm · 11 of 95 slices shown, 13 images]
[im 5/95  soft-tissue]
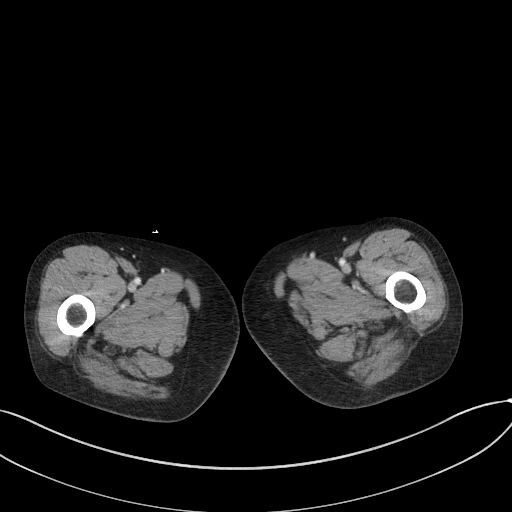
[im 5/95  bone]
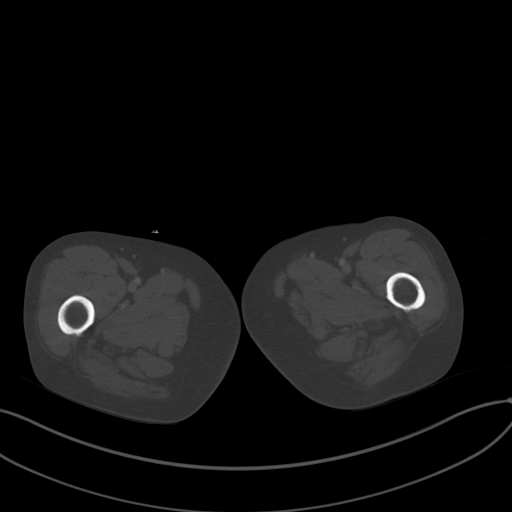
[im 15/95  soft-tissue]
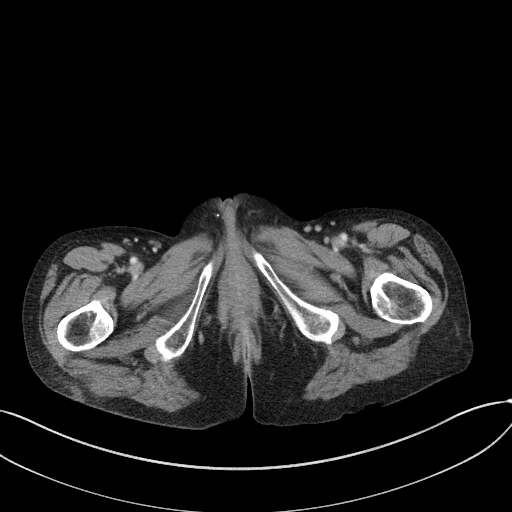
[im 25/95  soft-tissue]
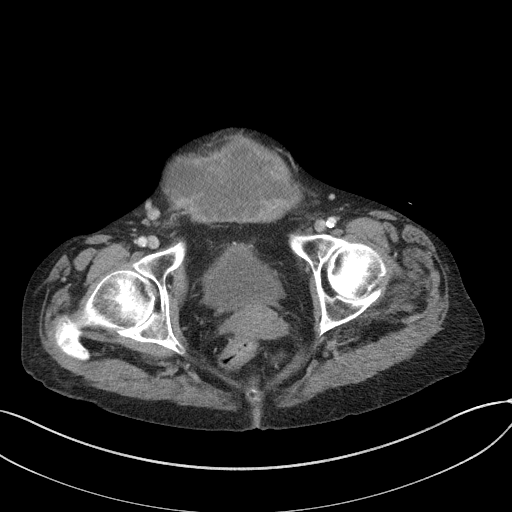
[im 30/95  soft-tissue]
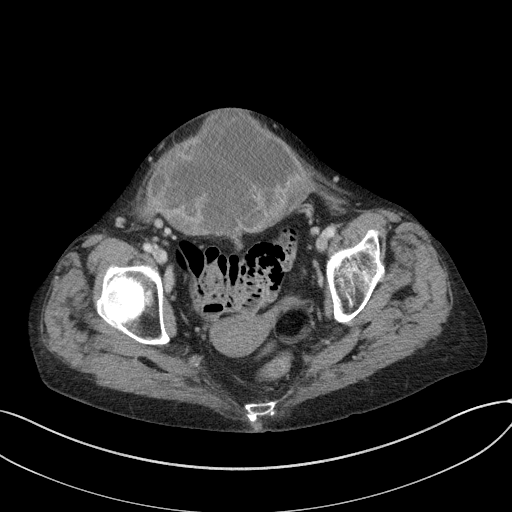
[im 40/95  soft-tissue]
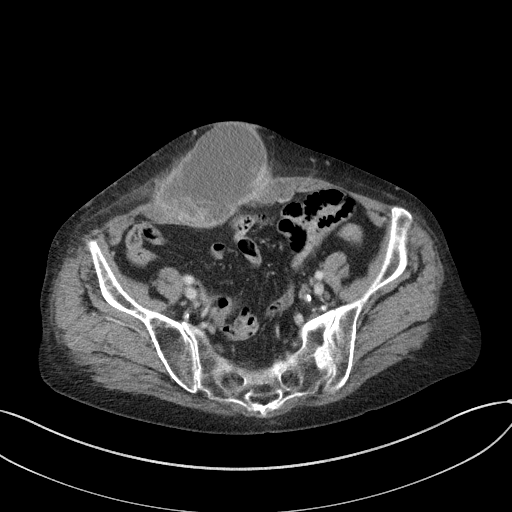
[im 50/95  soft-tissue]
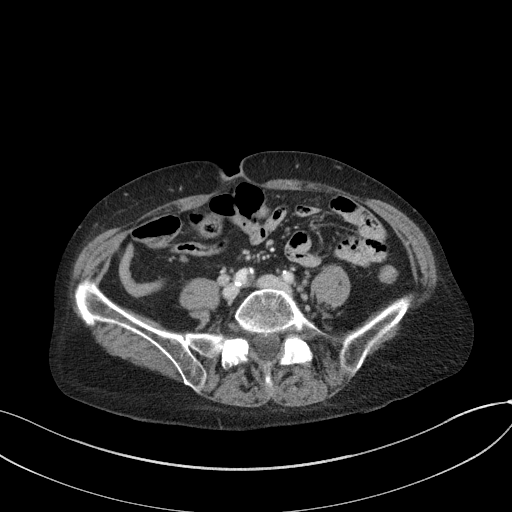
[im 55/95  soft-tissue]
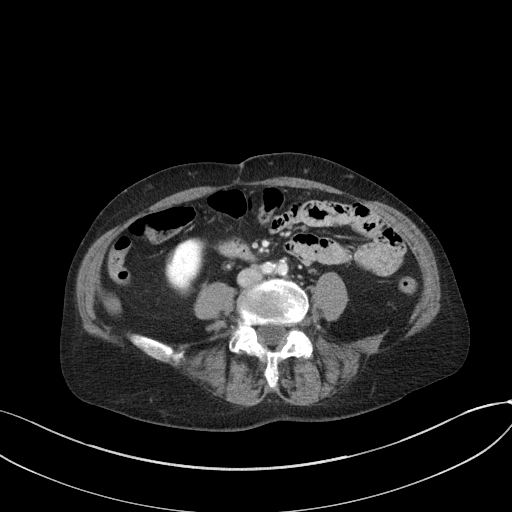
[im 65/95  soft-tissue]
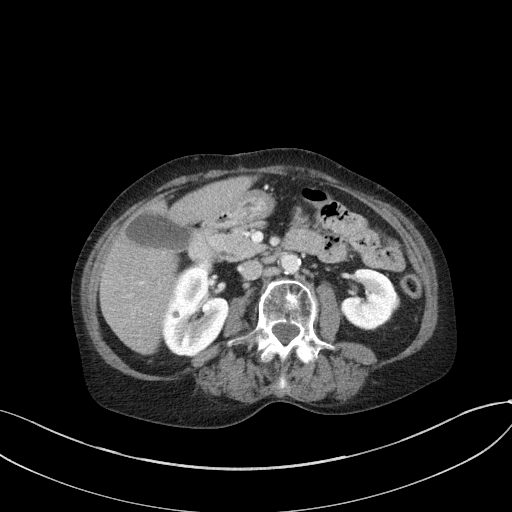
[im 70/95  soft-tissue]
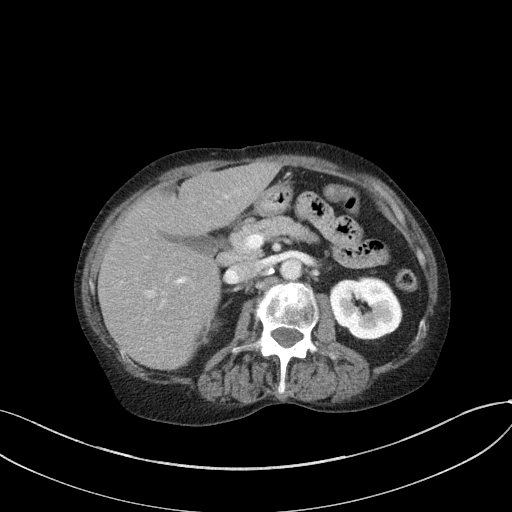
[im 70/95  bone]
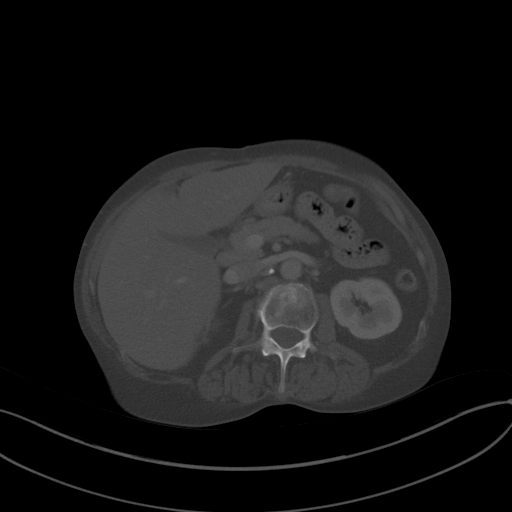
[im 80/95  soft-tissue]
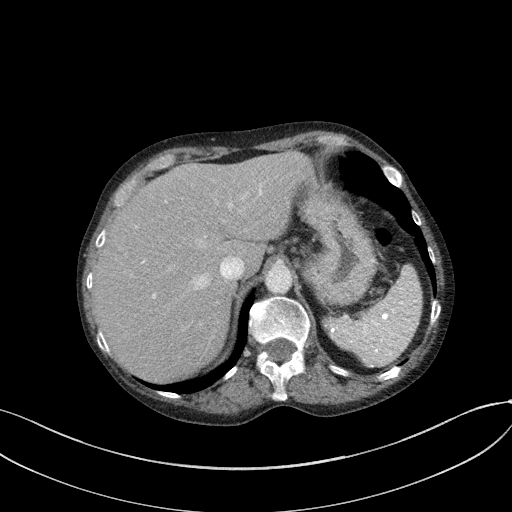
[im 90/95  soft-tissue]
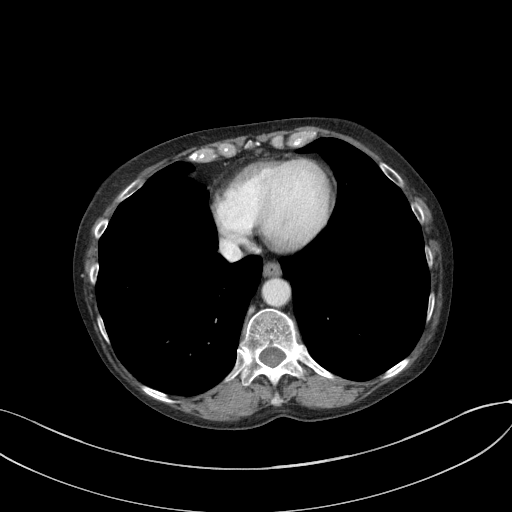

[Series 6: a/p w/ cor · coronal · 0.68mm/px · 3 of 137 slices shown]
[im 46/137  soft-tissue]
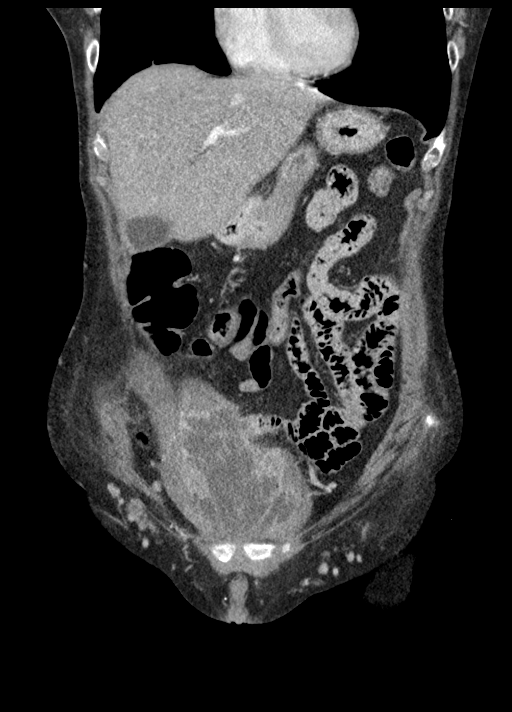
[im 61/137  soft-tissue]
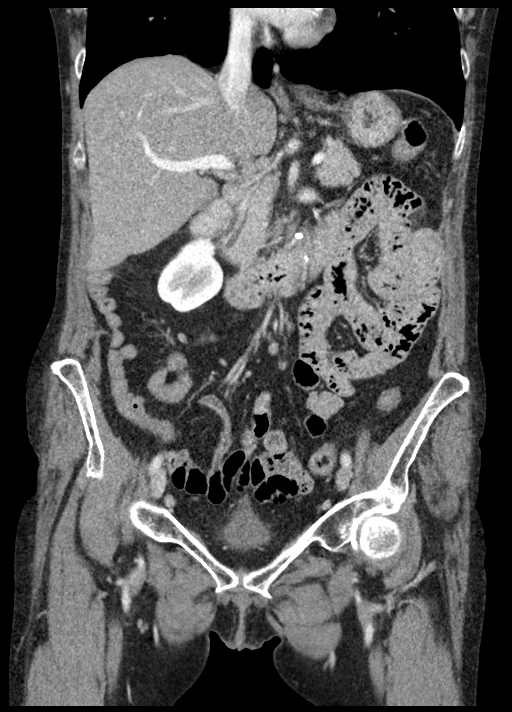
[im 76/137  soft-tissue]
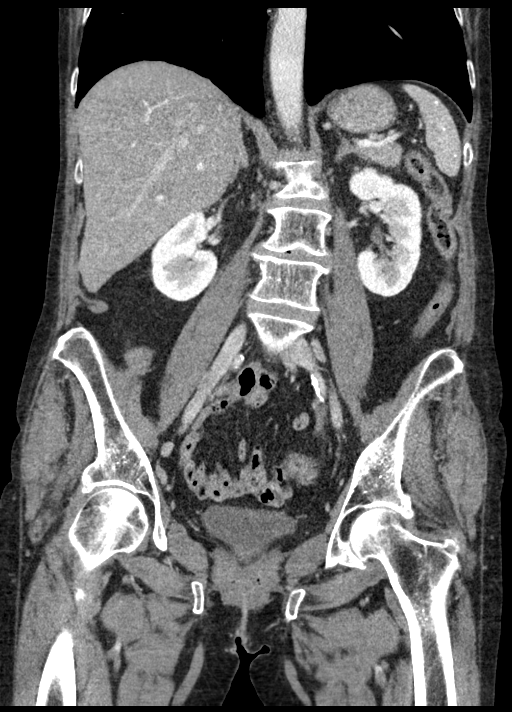

[14 of 46 positions shown; findings below may reference images not displayed]

FINDINGS: Lower chest: The visualized lung bases are clear.

No intra-abdominal free air or free fluid.

Hepatobiliary: Small scattered calcified hepatic granuloma. The
liver is otherwise unremarkable. No intrahepatic biliary ductal
dilatation. The gallbladder is unremarkable as well.

Pancreas: Unremarkable. No pancreatic ductal dilatation or
surrounding inflammatory changes.

Spleen: Small scattered calcified splenic granuloma.

Adrenals/Urinary Tract: The adrenal glands, kidneys, visualized
ureters, appear unremarkable. Subcentimeter right renal upper pole
hypodense lesion is too small to characterize but likely represents
a cyst. The urinary bladder is predominantly collapsed. There is
slight irregularity of the bladder wall which may be related to
underdistention or chronic infection. The anterior dome of the
bladder connects to the anterior pelvic wall mass via the the
urachal remnant. Extension of the mass along the urachal remnant
with invasion of the bladder wall is less likely but not entirely
excluded.

Stomach/Bowel: There is no evidence of bowel obstruction or active
inflammation. The appendix is not visualized with certainty. No
inflammatory changes identified in the right lower quadrant. There
is focal abutment of a loop of distal small bowel to the posterior
anterior pelvic wall masses.

Vascular/Lymphatic: There is moderate aortoiliac atherosclerotic
disease. The origins of the celiac axis, SMA, IMA and the renal
arteries are patent. No portal venous gas identified. Top-normal
retroperitoneal lymph node in the cavoatrial region measure up to 9
mm in short axis. Top-normal lymph node along the gastrohepatic
ligament measures up to 8 mm in short axis. Somewhat rounded
bilateral iliac chain and pelvic sidewall lymph nodes measure up to
8 mm in diameter.

Reproductive: The uterus is retroverted or retroflexed. There is a
3.0 x 3.7 cm fat containing lesion in the region of the left adnexa
most consistent with a teratoma.

Other: There is an 11 x 12 cm heterogeneous multilobulated mass with
areas of lower attenuation in the anterior pelvic wall and involving
the rectus abdominus musculature. A smaller mass noted in the right
inguinal region measuring approximately 2 x 2 cm. This mass is most
concerning for a neoplastic process and may represent a metastatic
disease or a primary neoplasm such as a sarcoma or lymphoma. Other
etiologies are not excluded. There is midline vertical anterior
abdominal wall incisional scar.

Musculoskeletal: Osteopenia with degenerative changes of the spine.
Multilevel old-appearing compression deformity primarily involving
L2 and L3. No acute fracture.
IMPRESSION: 1. Large heterogeneous anterior pelvic wall mass most consistent
with malignancy and may represent metastatic disease versus primary
neoplasm such as sarcoma or lymphoma. A smaller lesion is also seen
in the right anterior inguinal area.
2. Somewhat rounded bilateral iliac chain lymph node measuring up to
8 mm. A top-normal retroperitoneal nodes measuring approximately 9
mm in short axis is seen in the cavoatrial region.
3. No evidence of metastatic disease within the liver on this CT.
4. Left adnexal dermoid.
5. Mild irregularity and thickening of the bladder wall may be
related to underdistention or chronic infection versus chronic
bladder outlet obstruction. Infiltrative involvement of the bladder
by the pelvic wall mass is less likely but not entirely excluded.
6. No evidence of bowel obstruction or active inflammation. There is
focal abutment of a distal small bowel loops to the posterior aspect
of the anterior pelvic mass.

## 2018-09-29 ENCOUNTER — Inpatient Hospital Stay: Payer: Medicare Other | Attending: Oncology

## 2018-09-29 DIAGNOSIS — Z452 Encounter for adjustment and management of vascular access device: Secondary | ICD-10-CM | POA: Insufficient documentation

## 2018-09-29 DIAGNOSIS — Z85038 Personal history of other malignant neoplasm of large intestine: Secondary | ICD-10-CM | POA: Diagnosis not present

## 2018-09-29 DIAGNOSIS — Z95828 Presence of other vascular implants and grafts: Secondary | ICD-10-CM

## 2018-09-29 DIAGNOSIS — C189 Malignant neoplasm of colon, unspecified: Secondary | ICD-10-CM

## 2018-09-29 MED ORDER — SODIUM CHLORIDE 0.9% FLUSH
10.0000 mL | INTRAVENOUS | Status: DC | PRN
  Administered 2018-09-29: 10 mL via INTRAVENOUS
  Filled 2018-09-29: qty 10

## 2018-09-29 MED ORDER — HEPARIN SOD (PORK) LOCK FLUSH 100 UNIT/ML IV SOLN
500.0000 [IU] | Freq: Once | INTRAVENOUS | Status: AC | PRN
  Administered 2018-09-29: 500 [IU] via INTRAVENOUS
  Filled 2018-09-29: qty 5

## 2018-10-02 DIAGNOSIS — R590 Localized enlarged lymph nodes: Secondary | ICD-10-CM | POA: Diagnosis not present

## 2018-10-02 DIAGNOSIS — C44509 Unspecified malignant neoplasm of skin of other part of trunk: Secondary | ICD-10-CM | POA: Diagnosis not present

## 2018-11-10 ENCOUNTER — Inpatient Hospital Stay (HOSPITAL_BASED_OUTPATIENT_CLINIC_OR_DEPARTMENT_OTHER): Payer: Medicare Other | Admitting: Nurse Practitioner

## 2018-11-10 ENCOUNTER — Telehealth: Payer: Self-pay

## 2018-11-10 ENCOUNTER — Encounter: Payer: Self-pay | Admitting: Nurse Practitioner

## 2018-11-10 ENCOUNTER — Inpatient Hospital Stay: Payer: Medicare Other

## 2018-11-10 ENCOUNTER — Inpatient Hospital Stay: Payer: Medicare Other | Attending: Oncology

## 2018-11-10 VITALS — BP 132/72 | HR 78 | Temp 98.2°F | Resp 18 | Ht 66.0 in | Wt 114.3 lb

## 2018-11-10 DIAGNOSIS — C189 Malignant neoplasm of colon, unspecified: Secondary | ICD-10-CM

## 2018-11-10 DIAGNOSIS — Z9221 Personal history of antineoplastic chemotherapy: Secondary | ICD-10-CM | POA: Insufficient documentation

## 2018-11-10 DIAGNOSIS — Z85038 Personal history of other malignant neoplasm of large intestine: Secondary | ICD-10-CM | POA: Insufficient documentation

## 2018-11-10 DIAGNOSIS — C182 Malignant neoplasm of ascending colon: Secondary | ICD-10-CM

## 2018-11-10 DIAGNOSIS — Z95828 Presence of other vascular implants and grafts: Secondary | ICD-10-CM

## 2018-11-10 LAB — CEA (IN HOUSE-CHCC): CEA (CHCC-In House): <1 ng/mL (ref 0.00–5.00)

## 2018-11-10 MED ORDER — HEPARIN SOD (PORK) LOCK FLUSH 100 UNIT/ML IV SOLN
500.0000 [IU] | Freq: Once | INTRAVENOUS | Status: AC | PRN
  Administered 2018-11-10: 500 [IU] via INTRAVENOUS
  Filled 2018-11-10: qty 5

## 2018-11-10 MED ORDER — SODIUM CHLORIDE 0.9% FLUSH
10.0000 mL | INTRAVENOUS | Status: DC | PRN
  Administered 2018-11-10: 10 mL via INTRAVENOUS
  Filled 2018-11-10: qty 10

## 2018-11-10 NOTE — Telephone Encounter (Signed)
Printed avs and calender of upcoming appointment. Per 11/12 los. 

## 2018-11-10 NOTE — Progress Notes (Signed)
  [place] OFFICE PROGRESS NOTE   Diagnosis: Colon cancer  INTERVAL HISTORY:   Yolanda Watson returns as scheduled.  She feels well.  She has a good appetite.  No abdominal pain.  The left groin nodule is unchanged.  Objective:  Vital signs in last 24 hours:  Blood pressure 132/72, pulse 78, temperature 98.2 F (36.8 C), temperature source Oral, resp. rate 18, height '5\' 6"'$  (1.676 m), weight 114 lb 4.8 oz (51.8 kg), SpO2 98 %.    HEENT: Neck without mass. Lymphatics: No palpable cervical, supraclavicular, axillary or right inguinal lymph nodes.  Firm approximate 1-1/2 cm left inguinal lymph node. Resp: Lungs clear bilaterally. Cardio: Regular rate and rhythm. GI: Abdomen soft and nontender.  No hepatomegaly.  No mass. Vascular: No leg edema.  Port-A-Cath without erythema.  Lab Results:  Lab Results  Component Value Date   WBC 8.6 02/05/2018   HGB 12.3 02/05/2018   HCT 38.7 02/05/2018   MCV 86.2 02/05/2018   PLT 368 02/05/2018   NEUTROABS 4 01/15/2018    Imaging:  No results found.  Medications: I have reviewed the patient's current medications.  Assessment/Plan: 1. Metastatic colon cancer presenting with an Abdominal/pelvic mass  2. Colon cancerdiagnosed in [place] in 2016-pathology report dated 02/15/2015-invasive adenocarcinoma, well-differentiated with prominent mucinous features; microscopic tumor extension into mesenteric adipose tissue and focally involving visceral peritoneum; radial and mucosal margins negative; lymph-vascular invasion withsuspicious foci identified;perineural invasion not identified; 16 negative lymph nodes; tumor deposits not identified; pT4apN0;MSI stable  ? CT 04/22/2017 revealed a heterogenous anterior pelvic wall mass, right inguinal mass, top normal sized retroperitoneal and iliac nodes, abutment of the anterior dome of the bladder and a loop of small bowel ? 04/29/2017 biopsy of anterior abdominal/pelvic wall  mass-acellular mucin dissecting fibrous stroma ? Cycle 1 FOLFOX 05/15/2017 ? Cycle 2 FOLFOX 05/28/2017 ? Cycle 3 FOLFOX with Avastin on 06/11/2017 ? Cycle 4 FOLFOX/Avastin 06/25/2017 ? Cycle 5 FOLFOX/Avastin 07/10/2017, Neulasta added ? Restaging CT 07/21/2017-mild decrease in the size of a right inguinal node and the anterior abdominal wall mass ? Radiation to the abdominal wall mass 08/18/2017-09/03/2017 ? Status post excision of abdominal wall mass 10/03/2017--pathology showed adenocarcinoma with excessive extracellular mucin. Margins negative for tumor. Right inguinal wall mass also excised showing metastatic adenocarcinoma with excessive extracellular mucin in 1 of 8 lymph nodes.   3. Ongoing tobacco and alcohol use  4. Mild neutropenia related to chemotherapy 07/10/2017. Neulasta added.  5.Admission January 2019 with an abdominal wall abscess and pubis osteomyelitis, status post debridement and antibiotics    Disposition: [name] appears stable.  The left groin lymph node is unchanged.  There is no other evidence of disease progression.  We will follow-up on the CEA from today.  She will return for a Port-A-Cath flush in 6 weeks.  We will see her in follow-up with a CEA in 3 months.  She will contact the office in the interim with any problems.  Plan reviewed with Dr. Benay Spice.    Ned Card ANP/GNP-BC   11/10/2018  9:33 AM

## 2018-11-11 ENCOUNTER — Telehealth: Payer: Self-pay | Admitting: Emergency Medicine

## 2018-11-11 NOTE — Telephone Encounter (Addendum)
Patient verbalized understanding   ----- Message from Yolanda Shark, NP sent at 11/10/2018  5:08 PM EST ----- Please let her know the CEA is normal.  Follow-up as scheduled.

## 2018-12-05 ENCOUNTER — Other Ambulatory Visit: Payer: Self-pay | Admitting: Nurse Practitioner

## 2018-12-18 ENCOUNTER — Inpatient Hospital Stay: Payer: Medicare Other | Attending: Oncology

## 2018-12-18 DIAGNOSIS — Z452 Encounter for adjustment and management of vascular access device: Secondary | ICD-10-CM | POA: Insufficient documentation

## 2018-12-18 DIAGNOSIS — Z85038 Personal history of other malignant neoplasm of large intestine: Secondary | ICD-10-CM | POA: Diagnosis not present

## 2018-12-18 DIAGNOSIS — Z95828 Presence of other vascular implants and grafts: Secondary | ICD-10-CM

## 2018-12-18 DIAGNOSIS — C189 Malignant neoplasm of colon, unspecified: Secondary | ICD-10-CM

## 2018-12-18 MED ORDER — HEPARIN SOD (PORK) LOCK FLUSH 100 UNIT/ML IV SOLN
500.0000 [IU] | Freq: Once | INTRAVENOUS | Status: AC | PRN
  Administered 2018-12-18: 500 [IU] via INTRAVENOUS
  Filled 2018-12-18: qty 5

## 2018-12-18 MED ORDER — SODIUM CHLORIDE 0.9% FLUSH
10.0000 mL | INTRAVENOUS | Status: DC | PRN
  Administered 2018-12-18: 10 mL via INTRAVENOUS
  Filled 2018-12-18: qty 10

## 2019-02-09 ENCOUNTER — Inpatient Hospital Stay: Payer: Medicare Other

## 2019-02-09 ENCOUNTER — Inpatient Hospital Stay: Payer: Medicare Other | Attending: Oncology

## 2019-02-09 ENCOUNTER — Inpatient Hospital Stay (HOSPITAL_BASED_OUTPATIENT_CLINIC_OR_DEPARTMENT_OTHER): Payer: Medicare Other | Admitting: Oncology

## 2019-02-09 ENCOUNTER — Telehealth: Payer: Self-pay

## 2019-02-09 VITALS — BP 145/77 | HR 78 | Temp 98.2°F | Resp 18 | Ht 66.0 in | Wt 117.4 lb

## 2019-02-09 DIAGNOSIS — C189 Malignant neoplasm of colon, unspecified: Secondary | ICD-10-CM

## 2019-02-09 DIAGNOSIS — Z95828 Presence of other vascular implants and grafts: Secondary | ICD-10-CM

## 2019-02-09 DIAGNOSIS — C182 Malignant neoplasm of ascending colon: Secondary | ICD-10-CM

## 2019-02-09 DIAGNOSIS — Z85038 Personal history of other malignant neoplasm of large intestine: Secondary | ICD-10-CM | POA: Insufficient documentation

## 2019-02-09 LAB — CEA (IN HOUSE-CHCC): CEA (CHCC-In House): 1.27 ng/mL (ref 0.00–5.00)

## 2019-02-09 MED ORDER — HEPARIN SOD (PORK) LOCK FLUSH 100 UNIT/ML IV SOLN
500.0000 [IU] | Freq: Once | INTRAVENOUS | Status: AC | PRN
  Administered 2019-02-09: 500 [IU] via INTRAVENOUS
  Filled 2019-02-09: qty 5

## 2019-02-09 MED ORDER — SODIUM CHLORIDE 0.9% FLUSH
10.0000 mL | INTRAVENOUS | Status: DC | PRN
  Administered 2019-02-09: 10 mL via INTRAVENOUS
  Filled 2019-02-09: qty 10

## 2019-02-09 NOTE — Telephone Encounter (Signed)
Printed avs and calender of upcoming appointment. Per 2/11 los 

## 2019-02-09 NOTE — Progress Notes (Signed)
  [place] OFFICE PROGRESS NOTE   Diagnosis: Colon cancer  INTERVAL HISTORY:   [name] returns as scheduled.  She feels well.  She has a good appetite.  No change in the left inguinal node.  No new complaint.  She continues smoking.  Objective:  Vital signs in last 24 hours:  Blood pressure (!) 145/77, pulse 78, temperature 98.2 F (36.8 C), temperature source Oral, resp. rate 18, height '5\' 6"'$  (1.676 m), weight 117 lb 6.4 oz (53.3 kg), SpO2 100 %.    HEENT: Neck without mass Lymphatics: No cervical, supraclavicular, axillary, or right inguinal nodes.  1.5 cm firm medial left inguinal node Resp: Distant breath sounds, no respiratory distress Cardio: Regular rate and rhythm GI: No hepatosplenomegaly, no mass Vascular: No leg edema   Portacath/PICC-without erythema  Lab Results:    Lab Results  Component Value Date   CEA1 <1.00 11/10/2018     Medications: I have reviewed the patient's current medications.   Assessment/Plan: 1. Metastatic colon cancer presenting with an Abdominal/pelvic mass  2. Colon cancerdiagnosed in [place] in 2016-pathology report dated 02/15/2015-invasive adenocarcinoma, well-differentiated with prominent mucinous features; microscopic tumor extension into mesenteric adipose tissue and focally involving visceral peritoneum; radial and mucosal margins negative; lymph-vascular invasion withsuspicious foci identified;perineural invasion not identified; 16 negative lymph nodes; tumor deposits not identified; pT4apN0;MSI stable  ? CT 04/22/2017 revealed a heterogenous anterior pelvic wall mass, right inguinal mass, top normal sized retroperitoneal and iliac nodes, abutment of the anterior dome of the bladder and a loop of small bowel ? 04/29/2017 biopsy of anterior abdominal/pelvic wall mass-acellular mucin dissecting fibrous stroma ? Cycle 1 FOLFOX 05/15/2017 ? Cycle 2 FOLFOX 05/28/2017 ? Cycle 3 FOLFOX with Avastin on  06/11/2017 ? Cycle 4 FOLFOX/Avastin 06/25/2017 ? Cycle 5 FOLFOX/Avastin 07/10/2017, Neulasta added ? Restaging CT 07/21/2017-mild decrease in the size of a right inguinal node and the anterior abdominal wall mass ? Radiation to the abdominal wall mass 08/18/2017-09/03/2017 ? Status post excision of abdominal wall mass 10/03/2017--pathology showed adenocarcinoma with excessive extracellular mucin. Margins negative for tumor. Right inguinal wall mass also excised showing metastatic adenocarcinoma with excessive extracellular mucin in 1 of 8 lymph nodes.   3. Ongoing tobacco and alcohol use  4. Mild neutropenia related to chemotherapy 07/10/2017. Neulasta added.  5.Admission January 2019 with an abdominal wall abscess and pubis osteomyelitis, status post debridement and antibiotics      Disposition: [name] appears well.  There is a stable firm medial left inguinal node.  She would like to continue observation of this lesion.  She will return for a Port-A-Cath flush in 6 weeks and an office visit in 3 months.  We will refer her to Dr. Barry Dienes for a biopsy or excision if the lymph node enlarges.  Betsy Coder, MD  02/09/2019  12:46 PM

## 2019-02-10 ENCOUNTER — Telehealth: Payer: Self-pay | Admitting: *Deleted

## 2019-02-10 NOTE — Telephone Encounter (Signed)
Called for CEA results. Provided these as requested--normal. F/U as scheduled.

## 2019-03-02 MED ORDER — SILVER NITRATE-POT NITRATE 75-25 % EX MISC
CUTANEOUS | Status: AC
  Filled 2019-03-02: qty 1

## 2019-03-22 ENCOUNTER — Telehealth: Payer: Self-pay | Admitting: Oncology

## 2019-03-22 NOTE — Telephone Encounter (Signed)
Returned call to patient re cancelling 3/24 port flush appointment. Per patient she will resume port flush at 5/5 visit with provider, she feels she will be fine until then. Patient will call office it she has any problems with port. Message to provider,

## 2019-03-23 ENCOUNTER — Inpatient Hospital Stay: Payer: Medicare Other

## 2019-05-04 ENCOUNTER — Ambulatory Visit: Payer: Medicare Other | Admitting: Nurse Practitioner

## 2019-06-14 ENCOUNTER — Telehealth: Payer: Self-pay | Admitting: *Deleted

## 2019-06-14 NOTE — Telephone Encounter (Addendum)
Over past 2 months has developed progressive pain in her lower back that can radiate into the left him. Denies any trauma. Rates her pain " 9+" and taking Ibuprofen 600 mg every 12 hours with no relief. No swelling or weakness in the leg and is able to ambulate. Sometimes standing can help the pain. Also having a "burning sensation" in her anterior right thigh. Asking for an appointment. Provided appointment for 6/16 at 0930. Patient aware.

## 2019-06-15 ENCOUNTER — Telehealth: Payer: Self-pay | Admitting: Oncology

## 2019-06-15 ENCOUNTER — Other Ambulatory Visit: Payer: Self-pay

## 2019-06-15 ENCOUNTER — Inpatient Hospital Stay: Payer: Medicare Other | Attending: Oncology | Admitting: Oncology

## 2019-06-15 ENCOUNTER — Telehealth: Payer: Self-pay | Admitting: *Deleted

## 2019-06-15 VITALS — BP 146/79 | HR 109 | Temp 99.2°F | Resp 17 | Ht 66.0 in | Wt 110.6 lb

## 2019-06-15 DIAGNOSIS — C7989 Secondary malignant neoplasm of other specified sites: Secondary | ICD-10-CM

## 2019-06-15 DIAGNOSIS — C189 Malignant neoplasm of colon, unspecified: Secondary | ICD-10-CM | POA: Diagnosis not present

## 2019-06-15 DIAGNOSIS — C182 Malignant neoplasm of ascending colon: Secondary | ICD-10-CM

## 2019-06-15 DIAGNOSIS — M545 Low back pain: Secondary | ICD-10-CM

## 2019-06-15 DIAGNOSIS — Z5111 Encounter for antineoplastic chemotherapy: Secondary | ICD-10-CM | POA: Insufficient documentation

## 2019-06-15 DIAGNOSIS — I82220 Acute embolism and thrombosis of inferior vena cava: Secondary | ICD-10-CM | POA: Insufficient documentation

## 2019-06-15 DIAGNOSIS — C774 Secondary and unspecified malignant neoplasm of inguinal and lower limb lymph nodes: Secondary | ICD-10-CM | POA: Diagnosis not present

## 2019-06-15 DIAGNOSIS — Z7901 Long term (current) use of anticoagulants: Secondary | ICD-10-CM | POA: Diagnosis not present

## 2019-06-15 MED ORDER — OXYCODONE HCL 5 MG PO TABS: 5.0000 mg | ORAL_TABLET | ORAL | 0 refills | Status: DC | PRN

## 2019-06-15 NOTE — Telephone Encounter (Signed)
Patient stated she received a phone call, I informed her that it could have been from central radiology about her CT appt that was scheduled. I gave her the time of her appt and she also stated that she is okay with keeping her lab and port flush appt after her CT scan since it was scheduled for 7:00 in the morning, I informed GBS and he stated that was okay.

## 2019-06-15 NOTE — Telephone Encounter (Signed)
Per 6/16 schedule message moved lab/port to 2:30 prior to ct. Confirmed with patient.

## 2019-06-15 NOTE — Progress Notes (Signed)
[place] OFFICE PROGRESS NOTE   Diagnosis: Colon cancer  INTERVAL HISTORY:   Yolanda Watson returns for an unscheduled visit.  She reports pain at the left lower back/buttock for the past 2-1/2 months.  The pain has progressed and is unrelieved with ibuprofen.  She has a burning discomfort at the upper right anterior thigh.  No change in the firm left groin node.  No difficulty with bowel or bladder function.  No leg weakness or numbness.  She relates weight loss to changing her diet.  Objective:  Vital signs in last 24 hours:  Blood pressure (!) 146/79, pulse (!) 109, temperature 99.2 F (37.3 C), temperature source Oral, resp. rate 17, height _0  (1.676 m), weight 110 lb 9.6 oz (50.2 kg), SpO2 100 %. Limited physical examination secondary to distancing with the COVID pandemic GI: No hepatosplenomegaly, soft, mild tenderness in the left lower abdomen, no mass, no evidence of recurrent tumor at the midline surgical site Vascular: No leg edema Neuro: The motor exam is intact in the legs and feet bilaterally Skin: No rash at the right thigh Musculoskeletal: No pain with percussion of the spine, no pain with motion at the hips Lymph nodes: 2 firm lymph nodes in the left inguinal area, each measuring approximately 2 cm  Portacath/PICC-without erythema  Lab Results:  Lab Results  Component Value Date   WBC 8.6 02/05/2018   HGB 12.3 02/05/2018   HCT 38.7 02/05/2018   MCV 86.2 02/05/2018   PLT 368 02/05/2018   NEUTROABS 4 01/15/2018    CMP  Lab Results  Component Value Date   NA 139 02/05/2018   K 5.3 02/05/2018   CL 106 02/05/2018   CO2 23 02/05/2018   GLUCOSE 118 (H) 02/05/2018   BUN 10 02/05/2018   CREATININE 0.57 02/05/2018   CALCIUM 9.5 02/05/2018   PROT 6.2 (L) 12/30/2017   ALBUMIN 2.5 (L) 12/30/2017   AST 23 12/30/2017   ALT 18 12/30/2017   ALKPHOS 128 (H) 12/30/2017   BILITOT 0.5 12/30/2017   GFRNONAA >60 01/06/2018   GFRAA >60 01/06/2018     Lab Results  Component Value Date   CEA1 1.27 02/09/2019     Medications: I have reviewed the patient's current medications.   Assessment/Plan: 1. Metastatic colon cancer presenting with an Abdominal/pelvic mass  2. Colon cancerdiagnosed in [place] in [place]-pathology report dated 02/15/2015-invasive adenocarcinoma, well-differentiated with prominent mucinous features; microscopic tumor extension into mesenteric adipose tissue and focally involving visceral peritoneum; radial and mucosal margins negative; lymph-vascular invasion withsuspicious foci identified;perineural invasion not identified; 16 negative lymph nodes; tumor deposits not identified; pT4apN0;MSI stable  ? CT 04/22/2017 revealed a heterogenous anterior pelvic wall mass, right inguinal mass, top normal sized retroperitoneal and iliac nodes, abutment of the anterior dome of the bladder and a loop of small bowel ? 04/29/2017 biopsy of anterior abdominal/pelvic wall mass-acellular mucin dissecting fibrous stroma ? Cycle 1 FOLFOX 05/15/2017 ? Cycle 2 FOLFOX 05/28/2017 ? Cycle 3 FOLFOX with Avastin on 06/11/2017 ? Cycle 4 FOLFOX/Avastin 06/25/2017 ? Cycle 5 FOLFOX/Avastin 07/10/2017, Neulasta added ? Restaging CT 07/21/2017-mild decrease in the size of a right inguinal node and the anterior abdominal wall mass ? Radiation to the abdominal wall mass 08/18/2017-09/03/2017 ? Status post excision of abdominal wall mass 10/03/2017--pathology showed adenocarcinoma with excessive extracellular mucin. Margins negative for tumor. Right inguinal wall mass also excised showing metastatic adenocarcinoma with excessive extracellular mucin in 1 of 8 lymph nodes.   3. Ongoing tobacco and alcohol  use  4. Mild neutropenia related to chemotherapy 07/10/2017. Neulasta added.  5.Admission January 2019 with an abdominal wall abscess and pubis osteomyelitis, status post debridement and antibiotics   Disposition: [name] has  a history of metastatic colon cancer.  She presents today with pain in the lower back and right upper thigh.  I am concerned of a spine process.  She appears to be intact neurologically.  We will start with a CT of the abdomen pelvis to look for evidence of recurrent colon cancer involving the spine or pelvis.  She will return for an office visit on 06/17/2019. I prescribed oxycodone to use as needed for pain.  25 minutes were spent with the patient today.  The majority of the time was used for counseling and coordination of care.  Betsy Coder, MD  06/15/2019  10:03 AM

## 2019-06-15 NOTE — Telephone Encounter (Signed)
Was able to get CT scan for 6/17 at 1600 w/arrival at 1545. NPO 4 hours prior and contrast at 1400 and 1500. Confirmed with her that she has the contrast. Scheduling message sent to move lab/Port access to closer to scan tomorrow.

## 2019-06-15 NOTE — Telephone Encounter (Signed)
Scheduled appt per 6/16 los. Gave patient contrast and the number to central radiology.  Patient stated she will contact central radiology if she has not heard from them by lunch time.  Printed calendar and avs

## 2019-06-16 ENCOUNTER — Ambulatory Visit (HOSPITAL_COMMUNITY)
Admission: RE | Admit: 2019-06-16 | Discharge: 2019-06-16 | Disposition: A | Discharge status: Home / Self Care | Payer: Medicare Other | Source: Ambulatory Visit | Attending: Oncology | Admitting: Oncology

## 2019-06-16 ENCOUNTER — Other Ambulatory Visit: Payer: Self-pay

## 2019-06-16 ENCOUNTER — Inpatient Hospital Stay: Payer: Medicare Other

## 2019-06-16 ENCOUNTER — Ambulatory Visit (HOSPITAL_COMMUNITY): Admission: RE | Admit: 2019-06-16 | Payer: Medicare Other | Source: Ambulatory Visit

## 2019-06-16 DIAGNOSIS — C182 Malignant neoplasm of ascending colon: Secondary | ICD-10-CM | POA: Insufficient documentation

## 2019-06-16 DIAGNOSIS — C774 Secondary and unspecified malignant neoplasm of inguinal and lower limb lymph nodes: Secondary | ICD-10-CM | POA: Diagnosis not present

## 2019-06-16 DIAGNOSIS — M545 Low back pain: Secondary | ICD-10-CM | POA: Diagnosis not present

## 2019-06-16 DIAGNOSIS — C189 Malignant neoplasm of colon, unspecified: Secondary | ICD-10-CM

## 2019-06-16 DIAGNOSIS — C786 Secondary malignant neoplasm of retroperitoneum and peritoneum: Secondary | ICD-10-CM | POA: Diagnosis not present

## 2019-06-16 DIAGNOSIS — C7951 Secondary malignant neoplasm of bone: Secondary | ICD-10-CM | POA: Diagnosis not present

## 2019-06-16 DIAGNOSIS — C787 Secondary malignant neoplasm of liver and intrahepatic bile duct: Secondary | ICD-10-CM | POA: Diagnosis not present

## 2019-06-16 DIAGNOSIS — Z95828 Presence of other vascular implants and grafts: Secondary | ICD-10-CM

## 2019-06-16 DIAGNOSIS — I82220 Acute embolism and thrombosis of inferior vena cava: Secondary | ICD-10-CM | POA: Diagnosis not present

## 2019-06-16 DIAGNOSIS — Z85038 Personal history of other malignant neoplasm of large intestine: Secondary | ICD-10-CM | POA: Diagnosis not present

## 2019-06-16 DIAGNOSIS — C7989 Secondary malignant neoplasm of other specified sites: Secondary | ICD-10-CM | POA: Diagnosis not present

## 2019-06-16 DIAGNOSIS — Z5111 Encounter for antineoplastic chemotherapy: Secondary | ICD-10-CM | POA: Diagnosis not present

## 2019-06-16 LAB — CBC WITH DIFFERENTIAL (CANCER CENTER ONLY)
Abs Immature Granulocytes: 0.05 10*3/uL (ref 0.00–0.07)
Basophils Absolute: 0 10*3/uL (ref 0.0–0.1)
Basophils Relative: 0 %
Eosinophils Absolute: 0.6 10*3/uL — ABNORMAL HIGH (ref 0.0–0.5)
Eosinophils Relative: 5 %
HCT: 42.7 % (ref 36.0–46.0)
Hemoglobin: 14 g/dL (ref 12.0–15.0)
Immature Granulocytes: 1 %
Lymphocytes Relative: 11 %
Lymphs Abs: 1.2 10*3/uL (ref 0.7–4.0)
MCH: 32.8 pg (ref 26.0–34.0)
MCHC: 32.8 g/dL (ref 30.0–36.0)
MCV: 100 fL (ref 80.0–100.0)
Monocytes Absolute: 0.7 10*3/uL (ref 0.1–1.0)
Monocytes Relative: 6 %
Neutro Abs: 7.8 10*3/uL — ABNORMAL HIGH (ref 1.7–7.7)
Neutrophils Relative %: 77 %
Platelet Count: 164 10*3/uL (ref 150–400)
RBC: 4.27 MIL/uL (ref 3.87–5.11)
RDW: 13.6 % (ref 11.5–15.5)
WBC Count: 10.3 10*3/uL (ref 4.0–10.5)
nRBC: 0 % (ref 0.0–0.2)

## 2019-06-16 LAB — CMP (CANCER CENTER ONLY)
ALT: 17 U/L (ref 0–44)
AST: 17 U/L (ref 15–41)
Albumin: 3.7 g/dL (ref 3.5–5.0)
Alkaline Phosphatase: 86 U/L (ref 38–126)
Anion gap: 13 (ref 5–15)
BUN: 17 mg/dL (ref 8–23)
CO2: 23 mmol/L (ref 22–32)
Calcium: 9.1 mg/dL (ref 8.9–10.3)
Chloride: 100 mmol/L (ref 98–111)
Creatinine: 0.84 mg/dL (ref 0.44–1.00)
GFR, Est AFR Am: >60 mL/min (ref >60)
GFR, Estimated: >60 mL/min (ref >60)
Glucose, Bld: 120 mg/dL — ABNORMAL HIGH (ref 70–99)
Potassium: 4.1 mmol/L (ref 3.5–5.1)
Sodium: 136 mmol/L (ref 135–145)
Total Bilirubin: 0.3 mg/dL (ref 0.3–1.2)
Total Protein: 7.5 g/dL (ref 6.5–8.1)

## 2019-06-16 MED ORDER — HEPARIN SOD (PORK) LOCK FLUSH 100 UNIT/ML IV SOLN
500.0000 [IU] | Freq: Once | INTRAVENOUS | Status: AC
  Administered 2019-06-16: 500 [IU] via INTRAVENOUS

## 2019-06-16 MED ORDER — IOHEXOL 300 MG/ML  SOLN
100.0000 mL | Freq: Once | INTRAMUSCULAR | Status: AC | PRN
  Administered 2019-06-16: 100 mL via INTRAVENOUS

## 2019-06-16 MED ORDER — SODIUM CHLORIDE (PF) 0.9 % IJ SOLN
INTRAMUSCULAR | Status: AC
  Filled 2019-06-16: qty 50

## 2019-06-16 MED ORDER — HEPARIN SOD (PORK) LOCK FLUSH 100 UNIT/ML IV SOLN
INTRAVENOUS | Status: AC
  Filled 2019-06-16: qty 5

## 2019-06-16 MED ORDER — SODIUM CHLORIDE 0.9% FLUSH
10.0000 mL | INTRAVENOUS | Status: DC | PRN
  Administered 2019-06-16: 10 mL via INTRAVENOUS
  Filled 2019-06-16: qty 10

## 2019-06-17 ENCOUNTER — Other Ambulatory Visit: Payer: Self-pay | Admitting: *Deleted

## 2019-06-17 ENCOUNTER — Encounter: Payer: Self-pay | Admitting: Nurse Practitioner

## 2019-06-17 ENCOUNTER — Encounter: Payer: Self-pay | Admitting: *Deleted

## 2019-06-17 ENCOUNTER — Inpatient Hospital Stay (HOSPITAL_BASED_OUTPATIENT_CLINIC_OR_DEPARTMENT_OTHER): Payer: Medicare Other | Admitting: Nurse Practitioner

## 2019-06-17 ENCOUNTER — Other Ambulatory Visit: Payer: Self-pay

## 2019-06-17 ENCOUNTER — Telehealth: Payer: Self-pay | Admitting: *Deleted

## 2019-06-17 VITALS — BP 124/64 | HR 91 | Temp 98.5°F | Resp 18 | Ht 66.0 in | Wt 111.4 lb

## 2019-06-17 DIAGNOSIS — C774 Secondary and unspecified malignant neoplasm of inguinal and lower limb lymph nodes: Secondary | ICD-10-CM | POA: Diagnosis not present

## 2019-06-17 DIAGNOSIS — Z7901 Long term (current) use of anticoagulants: Secondary | ICD-10-CM | POA: Diagnosis not present

## 2019-06-17 DIAGNOSIS — C189 Malignant neoplasm of colon, unspecified: Secondary | ICD-10-CM | POA: Insufficient documentation

## 2019-06-17 DIAGNOSIS — I82220 Acute embolism and thrombosis of inferior vena cava: Secondary | ICD-10-CM | POA: Diagnosis not present

## 2019-06-17 DIAGNOSIS — C7989 Secondary malignant neoplasm of other specified sites: Secondary | ICD-10-CM

## 2019-06-17 DIAGNOSIS — C787 Secondary malignant neoplasm of liver and intrahepatic bile duct: Secondary | ICD-10-CM | POA: Insufficient documentation

## 2019-06-17 DIAGNOSIS — Z5111 Encounter for antineoplastic chemotherapy: Secondary | ICD-10-CM | POA: Diagnosis not present

## 2019-06-17 DIAGNOSIS — M545 Low back pain: Secondary | ICD-10-CM | POA: Diagnosis not present

## 2019-06-17 DIAGNOSIS — Z7189 Other specified counseling: Secondary | ICD-10-CM

## 2019-06-17 LAB — CEA (IN HOUSE-CHCC): CEA (CHCC-In House): 2.14 ng/mL (ref 0.00–5.00)

## 2019-06-17 MED ORDER — APIXABAN 5 MG PO TABS: 5.0000 mg | ORAL_TABLET | Freq: Two times a day (BID) | ORAL | 2 refills | Status: DC

## 2019-06-17 MED ORDER — RIVAROXABAN (XARELTO) VTE STARTER PACK (15 & 20 MG): ORAL_TABLET | ORAL | 0 refills | Status: DC

## 2019-06-17 MED FILL — XARELTO STARTER PACK: 15 & 20 | 30 days supply | Qty: 51 | Fill #0

## 2019-06-17 NOTE — Progress Notes (Signed)
Call to [facility] and requested [facility] testing on case #[id number] dated 10/03/2017. Dx: C18.2 Stage IV

## 2019-06-17 NOTE — Progress Notes (Addendum)
[facility] OFFICE PROGRESS NOTE   Diagnosis: Colon cancer  INTERVAL HISTORY:   Ms. Vessel returns as scheduled.  She continues to have left low back pain radiating to the groin.  She notes partial relief with oxycodone.  She is taking 1 tablet every 4 hours.  She denies nausea/vomiting.  Bowels moving regularly.  No fever, cough, shortness of breath.  Objective:  Vital signs in last 24 hours:  Blood pressure 124/64, pulse 91, temperature 98.5 F (36.9 C), temperature source Oral, resp. rate 18, height 5' 6" (1.676 m), weight 111 lb 6.4 oz (50.5 kg), SpO2 97 %.   Lymph nodes: 2 firm lymph nodes in the left inguinal region each measuring approximately 2 cm. GI: No hepatomegaly. Vascular: No leg edema. Neuro: Lower extremity motor strength 5/5. Port-A-Cath without erythema.   Lab Results:  Lab Results  Component Value Date   WBC 10.3 06/16/2019   HGB 14.0 06/16/2019   HCT 42.7 06/16/2019   MCV 100.0 06/16/2019   PLT 164 06/16/2019   NEUTROABS 7.8 (H) 06/16/2019    Imaging:  No results found.  Medications: I have reviewed the patient's current medications.  Assessment/Plan: 1. Metastatic colon cancer presenting with an Abdominal/pelvic mass  2. [place]-pathology report dated 02/15/2015-invasive adenocarcinoma, well-differentiated with prominent mucinous features; microscopic tumor extension into mesenteric adipose tissue and focally involving visceral peritoneum; radial and mucosal margins negative; lymph-vascular invasion withsuspicious foci identified;perineural invasion not identified; 16 negative lymph nodes; tumor deposits not identified; [id number];MSI stable  ? CT 04/22/2017 revealed a heterogenous anterior pelvic wall mass, right inguinal mass, top normal sized retroperitoneal and iliac nodes, abutment of the anterior dome of the bladder and a loop of small bowel ? 04/29/2017 biopsy of anterior  abdominal/pelvic wall mass-acellular mucin dissecting fibrous stroma ? Cycle 1 FOLFOX 05/15/2017 ? Cycle 2 FOLFOX 05/28/2017 ? Cycle 3 FOLFOX with Avastin on 06/11/2017 ? Cycle 4 FOLFOX/Avastin 06/25/2017 ? Cycle 5 FOLFOX/Avastin 07/10/2017, Neulasta added ? Restaging CT 07/21/2017-mild decrease in the size of a right inguinal node and the anterior abdominal wall mass ? Radiation to the abdominal wall mass 08/18/2017-09/03/2017 ? Status post excision of abdominal wall mass 10/03/2017--pathology showed adenocarcinoma with excessive extracellular mucin. Margins negative for tumor. Right inguinal wall mass also excised showing metastatic adenocarcinoma with excessive extracellular mucin in 1 of 8 lymph nodes. ? CT abdomen/pelvis 06/16/2019- new right hepatic lobe lesion measuring 2.7 x 1.7 cm.  Hypoattenuation within the pancreatic uncinate process measuring 1.5 x 1.6 cm.  Mild left adrenal thickening.  Upper pole left renal mass 3.0 x 3.6 cm.  Infiltrative hypoenhancement involving the lower pole left kidney.  Nonocclusive thrombus within the infrarenal IVC.  Necrotic adenopathy within the small bowel mesentery, new.  New necrotic left inguinal nodes.  Lytic lesion within and adjacent the anterior side of the L3 vertebral body.  Contiguous mass within the left psoas measuring 4.6 x 3.4 cm.   3. Ongoing tobacco and alcohol use  4. Mild neutropenia related to chemotherapy 07/10/2017. Neulasta added.  5.Admission January 2019 with an abdominal wall abscess and pubis osteomyelitis, status post debridement and antibiotics   6.         Pain secondary to #1. 7.         CT 06/16/2019 with nonocclusive thrombus infrarenal IVC.  Anticoagulation initiated.  Disposition: Ms. Markert appears to have recurrent colon cancer, metastatic to multiple areas.  We reviewed the CT results with her at today's visit.  We  reviewed the images. Dr. Benay Spice recommends initiating systemic therapy on the FOLFIRI  regimen.  We reviewed potential toxicities including bone marrow toxicity, nausea, diarrhea, hair loss.  She agrees to proceed.  We are requesting foundation 1 testing.  For pain she will increase oxycodone from 1 tablet every 4 hours to 2 tablets every 4 hours.  She will contact the office if this is not effective.  She further understands to contact the office if she develops worsening pain, leg weakness, numbness, bowel/bladder dysfunction.  She was noted to have an IVC thrombus on the CT.  She will begin anticoagulation.  She understands the risk of bleeding with anticoagulation.  She will return for cycle 1 FOLFIRI in 1 week.  She will return for lab, follow-up and cycle 2 FOLFIRI in 3 weeks.  She will contact the office in the interim as outlined above or with any other problems.  Patient seen with Dr. Benay Spice.  25 minutes were spent face-to-face at today's visit with the majority of that time involved in counseling/coordination of care.    Ned Card ANP/GNP-BC   06/17/2019  9:01 AM This was a shared visit with Ned Card.  The CT images are consistent with metastatic colon cancer involving multiple sites.  The back pain is likely related to a destructive retroperitoneal mass involving the spine.  We discussed the CT findings with [name].  She did not wish to review the CT images.  I reviewed the images.  We discussed treatment options including palliative radiation and chemotherapy.  The abdominal wall mass did not improve significantly when she received radiation.  I recommend a trial of FOLFIRI chemotherapy.  We reviewed potential toxicities associated with the FOLFIRI regimen and she agrees to proceed.  She will continue oxycodone as needed for pain.  There is an IVC thrombus on the CT from yesterday.  She is at risk for extension of the thrombus and embolic disease.  She will begin anticoagulation therapy.  We will submit the abdominal wall mass tissue for molecular  testing.  Julieanne Manson, MD

## 2019-06-17 NOTE — Telephone Encounter (Signed)
Received vm message from patient regarding cost of Eliquis, requesting call back. TCT patient. Spoke with her.  She was prescribed Eliquis today during her visit with Ned Card, NP for IVC thrombus. Pt went to her pharmacy-[facility] on [address] to pick new prescription. She was told her copay would > $500. Pt has no Medicare Part D, only a [employer]. This card has inaccurate information according to the pharmacy. Pt was asked to get the card information corrected.  Pt was able to do this but the copay is still >$400 with [facility]. After speaking with Ned Card, NP and Dr. Benay Spice and [facility], pt will get able to receive a free starter pack of Xareleto from [facility]. Pt given instructions on how to take-she is to take one 15 mg tablet daily until the 15 mg tablets are gone and then start the 20 mg tablets.  This should last her a little over 1 month. In the meantime, an application for patient assistance for the Xarelto was obtained from our financial assistance dept ([place]) As pt has no prescription insurance, she may be eligible for the Xarelto @ no cost, after she submits the application. Ned Card, NP will complete the providers' portion of the application. Pt's husband will pick up the application from the front desk tomorrow. Pt is to complete her portion of the application and send it in to the manufacturer's patient assistance dept. The address is provided on the application.  Pt is aware of all the above, she is able to repeat back all instructions.  Advised patient that she needs to apply for a Medicare Part D prescription program for future use. Pt has been reluctant to do this but understands that her medical needs are changing and that she needs a prescription plan.

## 2019-06-17 NOTE — Progress Notes (Signed)
DISCONTINUE ON PATHWAY REGIMEN - Colorectal     A cycle is every 14 days:     Oxaliplatin      Leucovorin      5-Fluorouracil      5-Fluorouracil      Bevacizumab   **Always confirm dose/schedule in your pharmacy ordering system**  REASON: Disease Progression PRIOR TREATMENT: COS72: Neoadjuvant mFOLFOX6 + Bevacizumab (Number of Cycles to be Determined by Surgeon) TREATMENT RESPONSE: Stable Disease (SD)  START ON PATHWAY REGIMEN - Colorectal     A cycle is every 14 days:     Irinotecan      Leucovorin      Fluorouracil      Fluorouracil   **Always confirm dose/schedule in your pharmacy ordering system**  Patient Characteristics: Distant Metastases, Nonsurgical Candidate, KRAS/NRAS Mutation Positive/Unknown (BRAF V600 Wild-Type/Unknown), Standard Cytotoxic Therapy, Second Line Standard Cytotoxic Therapy, Bevacizumab Ineligible Tumor Location: Colon Therapeutic Status: Distant Metastases Microsatellite/Mismatch Repair Status: MSS/pMMR BRAF Mutation Status: Awaiting Test Results KRAS/NRAS Mutation Status: Awaiting Test Results Standard Cytotoxic Line of Therapy: Second Line Standard Cytotoxic Therapy Bevacizumab Eligibility: Ineligible Intent of Therapy: Non-Curative / Palliative Intent, Discussed with Patient

## 2019-06-18 ENCOUNTER — Telehealth: Payer: Self-pay | Admitting: *Deleted

## 2019-06-18 NOTE — Telephone Encounter (Signed)
Husband presented to pick up the patient assistance application for Xarelto. Informed him that it has not been finished yet by provider. He thought he was to pick up her medication here-informed him that the free starter pack is at [place] and directions to pharmacy was provided.

## 2019-06-21 ENCOUNTER — Telehealth: Payer: Self-pay | Admitting: Oncology

## 2019-06-21 ENCOUNTER — Telehealth: Payer: Self-pay | Admitting: *Deleted

## 2019-06-21 ENCOUNTER — Telehealth: Payer: Self-pay

## 2019-06-21 ENCOUNTER — Other Ambulatory Visit: Payer: Self-pay | Admitting: Nurse Practitioner

## 2019-06-21 DIAGNOSIS — C189 Malignant neoplasm of colon, unspecified: Secondary | ICD-10-CM

## 2019-06-21 DIAGNOSIS — C787 Secondary malignant neoplasm of liver and intrahepatic bile duct: Secondary | ICD-10-CM

## 2019-06-21 MED ORDER — OXYCODONE HCL 5 MG PO TABS: 5.0000 mg | ORAL_TABLET | ORAL | 0 refills | Status: DC | PRN

## 2019-06-21 NOTE — Telephone Encounter (Signed)
Spoke with pt, advised that infusion appointment was Wednesday 6/24 @ 12:00. Advised pt to bring Medicare card and tax form pt assistant program. Pt states that she needed a refill  On oxycodone, confirmed how pt was taking medication. Pt states that she is taking Oxycodone 2 tabs every 4 hours and has 7 pills left. Advised I will left Ned Card NP know. Pt verbalized understanding on above information.

## 2019-06-21 NOTE — Telephone Encounter (Signed)
Called to inquire about her chemo appointment this week and needs refill on her pain medication. Message sent to scheduler and confirmed w/managed care to confirm that she does not need a PA for chemo. Notified MD of need for refill.

## 2019-06-21 NOTE — Telephone Encounter (Signed)
Called and spoke with patient about upcoming appts. Confirmed dates and times

## 2019-06-22 ENCOUNTER — Telehealth: Payer: Self-pay | Admitting: *Deleted

## 2019-06-22 NOTE — Telephone Encounter (Signed)
Requesting guidance on laxative (no BM since CT scan) and Krist[facility] Oppenheim will not refill her pain medication until 06/24/19. [facility] and made them aware her directions have changed to #1-2 tablets prn. They will fill med today. Notified patient of the above. Instructed her to take colace bid, MiraLax daily and can drink MagCitrate today for immediate relief of constipation. She will need to be on Colace and MiraLax every day since she is on narcotics now.

## 2019-06-23 ENCOUNTER — Encounter: Payer: Self-pay | Admitting: Oncology

## 2019-06-23 ENCOUNTER — Other Ambulatory Visit: Payer: Self-pay

## 2019-06-23 ENCOUNTER — Inpatient Hospital Stay: Payer: Medicare Other

## 2019-06-23 ENCOUNTER — Encounter: Payer: Self-pay | Admitting: *Deleted

## 2019-06-23 ENCOUNTER — Other Ambulatory Visit: Payer: Self-pay | Admitting: Oncology

## 2019-06-23 ENCOUNTER — Other Ambulatory Visit: Payer: Self-pay | Admitting: *Deleted

## 2019-06-23 VITALS — BP 104/65 | HR 83 | Temp 98.0°F | Resp 16

## 2019-06-23 DIAGNOSIS — I82220 Acute embolism and thrombosis of inferior vena cava: Secondary | ICD-10-CM | POA: Diagnosis not present

## 2019-06-23 DIAGNOSIS — C787 Secondary malignant neoplasm of liver and intrahepatic bile duct: Secondary | ICD-10-CM

## 2019-06-23 DIAGNOSIS — C774 Secondary and unspecified malignant neoplasm of inguinal and lower limb lymph nodes: Secondary | ICD-10-CM | POA: Diagnosis not present

## 2019-06-23 DIAGNOSIS — C189 Malignant neoplasm of colon, unspecified: Secondary | ICD-10-CM

## 2019-06-23 DIAGNOSIS — C7989 Secondary malignant neoplasm of other specified sites: Secondary | ICD-10-CM | POA: Diagnosis not present

## 2019-06-23 DIAGNOSIS — M545 Low back pain: Secondary | ICD-10-CM | POA: Diagnosis not present

## 2019-06-23 DIAGNOSIS — Z5111 Encounter for antineoplastic chemotherapy: Secondary | ICD-10-CM | POA: Diagnosis not present

## 2019-06-23 MED ORDER — SODIUM CHLORIDE 0.9 % IV SOLN
2400.0000 mg/m2 | INTRAVENOUS | Status: DC
  Administered 2019-06-23: 3650 mg via INTRAVENOUS
  Filled 2019-06-23: qty 73

## 2019-06-23 MED ORDER — ATROPINE SULFATE 1 MG/ML IJ SOLN
INTRAMUSCULAR | Status: AC
  Filled 2019-06-23: qty 1

## 2019-06-23 MED ORDER — PALONOSETRON HCL INJECTION 0.25 MG/5ML
0.2500 mg | Freq: Once | INTRAVENOUS | Status: AC
  Administered 2019-06-23: 0.25 mg via INTRAVENOUS

## 2019-06-23 MED ORDER — DEXAMETHASONE SODIUM PHOSPHATE 10 MG/ML IJ SOLN
10.0000 mg | Freq: Once | INTRAMUSCULAR | Status: AC
  Administered 2019-06-23: 10 mg via INTRAVENOUS

## 2019-06-23 MED ORDER — PROCHLORPERAZINE MALEATE 10 MG PO TABS: 10.0000 mg | ORAL_TABLET | Freq: Four times a day (QID) | ORAL | 1 refills | Status: DC | PRN

## 2019-06-23 MED ORDER — DEXAMETHASONE SODIUM PHOSPHATE 10 MG/ML IJ SOLN
INTRAMUSCULAR | Status: AC
  Filled 2019-06-23: qty 1

## 2019-06-23 MED ORDER — SODIUM CHLORIDE 0.9 % IV SOLN
Freq: Once | INTRAVENOUS | Status: AC
  Administered 2019-06-23: 13:00:00 via INTRAVENOUS
  Filled 2019-06-23: qty 250

## 2019-06-23 MED ORDER — LEUCOVORIN CALCIUM INJECTION 350 MG
400.0000 mg/m2 | Freq: Once | INTRAVENOUS | Status: AC
  Administered 2019-06-23: 612 mg via INTRAVENOUS
  Filled 2019-06-23: qty 30.6

## 2019-06-23 MED ORDER — FLUOROURACIL CHEMO INJECTION 2.5 GM/50ML
400.0000 mg/m2 | Freq: Once | INTRAVENOUS | Status: AC
  Administered 2019-06-23: 600 mg via INTRAVENOUS
  Filled 2019-06-23: qty 12

## 2019-06-23 MED ORDER — IRINOTECAN HCL CHEMO INJECTION 100 MG/5ML
180.0000 mg/m2 | Freq: Once | INTRAVENOUS | Status: AC
  Administered 2019-06-23: 280 mg via INTRAVENOUS
  Filled 2019-06-23: qty 14

## 2019-06-23 MED ORDER — PALONOSETRON HCL INJECTION 0.25 MG/5ML
INTRAVENOUS | Status: AC
  Filled 2019-06-23: qty 5

## 2019-06-23 MED ORDER — SODIUM CHLORIDE 0.9 % IV SOLN: 10.0000 mg | Freq: Once | INTRAVENOUS | Status: DC

## 2019-06-23 MED ORDER — ATROPINE SULFATE 1 MG/ML IJ SOLN
0.5000 mg | Freq: Once | INTRAMUSCULAR | Status: AC | PRN
  Administered 2019-06-23: 0.5 mg via INTRAVENOUS

## 2019-06-23 MED FILL — PROCHLORPERAZINE 10 MG TAB: 10 | 7 days supply | Qty: 30 | Fill #0

## 2019-06-23 NOTE — Progress Notes (Signed)
Approved patient for one-time $700 [place] to assist with medication cost as indicated as a need by RN.  Sent RN message to send oral prescriptions to [facility] to be filled.

## 2019-06-23 NOTE — Progress Notes (Signed)
Ok to use labs from 06/16/2019 per Dr. Benay Spice.  Pt experienced loose stool X 1, and "gurgling" in stomach at end of infusion.  Atropine administered per protocol.  Pt symptoms subsided after administration.    RN provided first time chemotherapy education.  All questions were answered.

## 2019-06-23 NOTE — Progress Notes (Signed)
Application to [employer] for Xarelto completed by Dr. Benay Spice and patient completed her portion of application. Faxed this with Medicare Card and Form [id number] for 2019 for her and her husband to fax [phone] with confirmation receipt. Sent message to financial advocate to determine she has applied for the [place] yet? This could help her with some of her medications as well.

## 2019-06-23 NOTE — Progress Notes (Signed)
Patient was approved for [place] and was notified. She will have her husband pick up the script at [facility] today for compazine.

## 2019-06-23 NOTE — Patient Instructions (Signed)
[facility] Discharge Instructions for Patients Receiving Chemotherapy  Today you received the following chemotherapy agents: Irinotecan, Leucovorin, and Fluorouracil.    To help prevent nausea and vomiting after your treatment, we encourage you to take your nausea medication as directed.   If you develop nausea and vomiting that is not controlled by your nausea medication, call the clinic.   BELOW ARE SYMPTOMS THAT SHOULD BE REPORTED IMMEDIATELY:  *FEVER GREATER THAN 100.5 F  *CHILLS WITH OR WITHOUT FEVER  NAUSEA AND VOMITING THAT IS NOT CONTROLLED WITH YOUR NAUSEA MEDICATION  *UNUSUAL SHORTNESS OF BREATH  *UNUSUAL BRUISING OR BLEEDING  TENDERNESS IN MOUTH AND THROAT WITH OR WITHOUT PRESENCE OF ULCERS  *URINARY PROBLEMS  *BOWEL PROBLEMS  UNUSUAL RASH Items with * indicate a potential emergency and should be followed up as soon as possible.  Feel free to call the clinic should you have any questions or concerns. The clinic phone number is [phone].  Please show the [facility] at check-in to the Emergency Department and triage nurse.   Irinotecan injection What is this medicine? IRINOTECAN (ir in oh TEE kan ) is a chemotherapy drug. It is used to treat colon and rectal cancer. This medicine may be used for other purposes; ask your health care provider or pharmacist if you have questions. COMMON BRAND NAME(S): Camptosar What should I tell my health care provider before I take this medicine? They need to know if you have any of these conditions: -dehydration -diarrhea -infection (especially a virus infection such as chickenpox, cold sores, or herpes) -liver disease -low blood counts, like low white cell, platelet, or red cell counts -low levels of calcium, magnesium, or potassium in the blood -recent or ongoing radiation therapy -an unusual or allergic reaction to irinotecan, other medicines, foods, dyes, or preservatives -pregnant or trying to  get pregnant -breast-feeding How should I use this medicine? This drug is given as an infusion into a vein. It is administered in a hospital or clinic by a specially trained health care professional. Talk to your pediatrician regarding the use of this medicine in children. Special care may be needed. Overdosage: If you think you have taken too much of this medicine contact a poison control center or emergency room at once. NOTE: This medicine is only for you. Do not share this medicine with others. What if I miss a dose? It is important not to miss your dose. Call your doctor or health care professional if you are unable to keep an appointment. What may interact with this medicine? This medicine may interact with the following medications: -antiviral medicines for HIV or AIDS -certain antibiotics like rifampin or rifabutin -certain medicines for fungal infections like itraconazole, ketoconazole, posaconazole, and voriconazole -certain medicines for seizures like carbamazepine, phenobarbital, phenotoin -clarithromycin -gemfibrozil -nefazodone -St. John's Wort This list may not describe all possible interactions. Give your health care provider a list of all the medicines, herbs, non-prescription drugs, or dietary supplements you use. Also tell them if you smoke, drink alcohol, or use illegal drugs. Some items may interact with your medicine. What should I watch for while using this medicine? Your condition will be monitored carefully while you are receiving this medicine. You will need important blood work done while you are taking this medicine. This drug may make you feel generally unwell. This is not uncommon, as chemotherapy can affect healthy cells as well as cancer cells. Report any side effects. Continue your course of treatment even though you feel  ill unless your doctor tells you to stop. In some cases, you may be given additional medicines to help with side effects. Follow all  directions for their use. You may get drowsy or dizzy. Do not drive, use machinery, or do anything that needs mental alertness until you know how this medicine affects you. Do not stand or sit up quickly, especially if you are an older patient. This reduces the risk of dizzy or fainting spells. Call your doctor or health care professional for advice if you get a fever, chills or sore throat, or other symptoms of a cold or flu. Do not treat yourself. This drug decreases your body's ability to fight infections. Try to avoid being around people who are sick. This medicine may increase your risk to bruise or bleed. Call your doctor or health care professional if you notice any unusual bleeding. Be careful brushing and flossing your teeth or using a toothpick because you may get an infection or bleed more easily. If you have any dental work done, tell your dentist you are receiving this medicine. Avoid taking products that contain aspirin, acetaminophen, ibuprofen, naproxen, or ketoprofen unless instructed by your doctor. These medicines may hide a fever. Do not become pregnant while taking this medicine. Women should inform their doctor if they wish to become pregnant or think they might be pregnant. There is a potential for serious side effects to an unborn child. Talk to your health care professional or pharmacist for more information. Do not breast-feed an infant while taking this medicine. What side effects may I notice from receiving this medicine? Side effects that you should report to your doctor or health care professional as soon as possible: -allergic reactions like skin rash, itching or hives, swelling of the face, lips, or tongue -chest pain -diarrhea -flushing, runny nose, sweating during infusion -low blood counts - this medicine may decrease the number of white blood cells, red blood cells and platelets. You may be at increased risk for infections and bleeding. -nausea, vomiting -pain,  swelling, warmth in the leg -signs of decreased platelets or bleeding - bruising, pinpoint red spots on the skin, black, tarry stools, blood in the urine -signs of infection - fever or chills, cough, sore throat, pain or difficulty passing urine -signs of decreased red blood cells - unusually weak or tired, fainting spells, lightheadedness Side effects that usually do not require medical attention (report to your doctor or health care professional if they continue or are bothersome): -constipation -hair loss -headache -loss of appetite -mouth sores -stomach pain This list may not describe all possible side effects. Call your doctor for medical advice about side effects. You may report side effects to FDA at 1-800-FDA-1088. Where should I keep my medicine? This drug is given in a hospital or clinic and will not be stored at home. NOTE: This sheet is a summary. It may not cover all possible information. If you have questions about this medicine, talk to your doctor, pharmacist, or health care provider.  2019 Elsevier/Gold Standard (2018-03-03 15:02:58)   Leucovorin injection What is this medicine? LEUCOVORIN (loo koe VOR in) is used to prevent or treat the harmful effects of some medicines. This medicine is used to treat anemia caused by a low amount of folic acid in the body. It is also used with 5-fluorouracil (5-FU) to treat colon cancer. This medicine may be used for other purposes; ask your health care provider or pharmacist if you have questions. What should I tell my health  care provider before I take this medicine? They need to know if you have any of these conditions: -anemia from low levels of vitamin B-12 in the blood -an unusual or allergic reaction to leucovorin, folic acid, other medicines, foods, dyes, or preservatives -pregnant or trying to get pregnant -breast-feeding How should I use this medicine? This medicine is for injection into a muscle or into a vein. It is given  by a health care professional in a hospital or clinic setting. Talk to your pediatrician regarding the use of this medicine in children. Special care may be needed. Overdosage: If you think you have taken too much of this medicine contact a poison control center or emergency room at once. NOTE: This medicine is only for you. Do not share this medicine with others. What if I miss a dose? This does not apply. What may interact with this medicine? -capecitabine -fluorouracil -phenobarbital -phenytoin -primidone -trimethoprim-sulfamethoxazole This list may not describe all possible interactions. Give your health care provider a list of all the medicines, herbs, non-prescription drugs, or dietary supplements you use. Also tell them if you smoke, drink alcohol, or use illegal drugs. Some items may interact with your medicine. What should I watch for while using this medicine? Your condition will be monitored carefully while you are receiving this medicine. This medicine may increase the side effects of 5-fluorouracil, 5-FU. Tell your doctor or health care professional if you have diarrhea or mouth sores that do not get better or that get worse. What side effects may I notice from receiving this medicine? Side effects that you should report to your doctor or health care professional as soon as possible: -allergic reactions like skin rash, itching or hives, swelling of the face, lips, or tongue -breathing problems -fever, infection -mouth sores -unusual bleeding or bruising -unusually weak or tired Side effects that usually do not require medical attention (report to your doctor or health care professional if they continue or are bothersome): -constipation or diarrhea -loss of appetite -nausea, vomiting This list may not describe all possible side effects. Call your doctor for medical advice about side effects. You may report side effects to FDA at 1-800-FDA-1088. Where should I keep my  medicine? This drug is given in a hospital or clinic and will not be stored at home. NOTE: This sheet is a summary. It may not cover all possible information. If you have questions about this medicine, talk to your doctor, pharmacist, or health care provider.  2019 Elsevier/Gold Standard (2008-06-21 16:50:29)   Fluorouracil, 5-FU injection What is this medicine? FLUOROURACIL, 5-FU (flure oh YOOR a sil) is a chemotherapy drug. It slows the growth of cancer cells. This medicine is used to treat many types of cancer like breast cancer, colon or rectal cancer, pancreatic cancer, and stomach cancer. This medicine may be used for other purposes; ask your health care provider or pharmacist if you have questions. COMMON BRAND NAME(S): Adrucil What should I tell my health care provider before I take this medicine? They need to know if you have any of these conditions: -blood disorders -dihydropyrimidine dehydrogenase (DPD) deficiency -infection (especially a virus infection such as chickenpox, cold sores, or herpes) -kidney disease -liver disease -malnourished, poor nutrition -recent or ongoing radiation therapy -an unusual or allergic reaction to fluorouracil, other chemotherapy, other medicines, foods, dyes, or preservatives -pregnant or trying to get pregnant -breast-feeding How should I use this medicine? This drug is given as an infusion or injection into a vein. It is administered in  a hospital or clinic by a specially trained health care professional. Talk to your pediatrician regarding the use of this medicine in children. Special care may be needed. Overdosage: If you think you have taken too much of this medicine contact a poison control center or emergency room at once. NOTE: This medicine is only for you. Do not share this medicine with others. What if I miss a dose? It is important not to miss your dose. Call your doctor or health care professional if you are unable to keep an  appointment. What may interact with this medicine? -allopurinol -cimetidine -dapsone -digoxin -hydroxyurea -leucovorin -levamisole -medicines for seizures like ethotoin, fosphenytoin, phenytoin -medicines to increase blood counts like filgrastim, pegfilgrastim, sargramostim -medicines that treat or prevent blood clots like warfarin, enoxaparin, and dalteparin -methotrexate -metronidazole -pyrimethamine -some other chemotherapy drugs like busulfan, cisplatin, estramustine, vinblastine -trimethoprim -trimetrexate -vaccines Talk to your doctor or health care professional before taking any of these medicines: -acetaminophen -aspirin -ibuprofen -ketoprofen -naproxen This list may not describe all possible interactions. Give your health care provider a list of all the medicines, herbs, non-prescription drugs, or dietary supplements you use. Also tell them if you smoke, drink alcohol, or use illegal drugs. Some items may interact with your medicine. What should I watch for while using this medicine? Visit your doctor for checks on your progress. This drug may make you feel generally unwell. This is not uncommon, as chemotherapy can affect healthy cells as well as cancer cells. Report any side effects. Continue your course of treatment even though you feel ill unless your doctor tells you to stop. In some cases, you may be given additional medicines to help with side effects. Follow all directions for their use. Call your doctor or health care professional for advice if you get a fever, chills or sore throat, or other symptoms of a cold or flu. Do not treat yourself. This drug decreases your body's ability to fight infections. Try to avoid being around people who are sick. This medicine may increase your risk to bruise or bleed. Call your doctor or health care professional if you notice any unusual bleeding. Be careful brushing and flossing your teeth or using a toothpick because you may get  an infection or bleed more easily. If you have any dental work done, tell your dentist you are receiving this medicine. Avoid taking products that contain aspirin, acetaminophen, ibuprofen, naproxen, or ketoprofen unless instructed by your doctor. These medicines may hide a fever. Do not become pregnant while taking this medicine. Women should inform their doctor if they wish to become pregnant or think they might be pregnant. There is a potential for serious side effects to an unborn child. Talk to your health care professional or pharmacist for more information. Do not breast-feed an infant while taking this medicine. Men should inform their doctor if they wish to father a child. This medicine may lower sperm counts. Do not treat diarrhea with over the counter products. Contact your doctor if you have diarrhea that lasts more than 2 days or if it is severe and watery. This medicine can make you more sensitive to the sun. Keep out of the sun. If you cannot avoid being in the sun, wear protective clothing and use sunscreen. Do not use sun lamps or tanning beds/booths. What side effects may I notice from receiving this medicine? Side effects that you should report to your doctor or health care professional as soon as possible: -allergic reactions like skin rash, itching  or hives, swelling of the face, lips, or tongue -low blood counts - this medicine may decrease the number of white blood cells, red blood cells and platelets. You may be at increased risk for infections and bleeding. -signs of infection - fever or chills, cough, sore throat, pain or difficulty passing urine -signs of decreased platelets or bleeding - bruising, pinpoint red spots on the skin, black, tarry stools, blood in the urine -signs of decreased red blood cells - unusually weak or tired, fainting spells, lightheadedness -breathing problems -changes in vision -chest pain -mouth sores -nausea and vomiting -pain, swelling, redness  at site where injected -pain, tingling, numbness in the hands or feet -redness, swelling, or sores on hands or feet -stomach pain -unusual bleeding Side effects that usually do not require medical attention (report to your doctor or health care professional if they continue or are bothersome): -changes in finger or toe nails -diarrhea -dry or itchy skin -hair loss -headache -loss of appetite -sensitivity of eyes to the light -stomach upset -unusually teary eyes This list may not describe all possible side effects. Call your doctor for medical advice about side effects. You may report side effects to FDA at 1-800-FDA-1088. Where should I keep my medicine? This drug is given in a hospital or clinic and will not be stored at home. NOTE: This sheet is a summary. It may not cover all possible information. If you have questions about this medicine, talk to your doctor, pharmacist, or health care provider.  2019 Elsevier/Gold Standard (2008-04-20 13:53:16)

## 2019-06-24 ENCOUNTER — Telehealth: Payer: Self-pay | Admitting: *Deleted

## 2019-06-24 NOTE — Telephone Encounter (Signed)
TCT patient to follow up with her after her first Folfiri chemo yesterday. Spoke with patient. She states she did well with chemo yesterday. Her only issue is she experienced increased back after chemo. She understands that it is likely due to tumor and tumor response to chemo. Pt has oxycodone 5 mg tabs and can take 1-2 tabs every 4 hours as needed for pain.  She did voice concern with addiction. Explained to patient that she has legitimate reason for pain and it needs to be kept under control. If she needs to take 2 tabs for relief, advised her to do so.  Reviewed side effect of constipation with pain meds. Pt is using miralax at this time with good results. Advised pt to call if she develops issues with constipation. Educated on maintained good nutrition and fluid intake of 64 oz daily.  Pt aware of pump d/c appt tomorrow. Reviewed her financial concerns and grant application approval. She is aware of [place] grant $$ she can use at [facility] Application for assistance for her Xarelto has been fax'd to [facility] per Merceda Elks, RN note. Pt voiced understanding of the above and knows to call with any questions or concerns

## 2019-06-24 NOTE — Telephone Encounter (Signed)
-----   Message from Rennis Harding, RN sent at 06/23/2019  3:44 PM EDT ----- Regarding: Dr. Benay Spice 1st time chemo follow up 1st time FolFiri follow up - pt tolerated well

## 2019-06-25 ENCOUNTER — Other Ambulatory Visit: Payer: Self-pay

## 2019-06-25 ENCOUNTER — Inpatient Hospital Stay: Payer: Medicare Other

## 2019-06-25 VITALS — BP 121/63 | HR 84 | Temp 100.0°F | Resp 16

## 2019-06-25 DIAGNOSIS — C189 Malignant neoplasm of colon, unspecified: Secondary | ICD-10-CM

## 2019-06-25 DIAGNOSIS — Z5111 Encounter for antineoplastic chemotherapy: Secondary | ICD-10-CM | POA: Diagnosis not present

## 2019-06-25 DIAGNOSIS — C7989 Secondary malignant neoplasm of other specified sites: Secondary | ICD-10-CM | POA: Diagnosis not present

## 2019-06-25 DIAGNOSIS — Z95828 Presence of other vascular implants and grafts: Secondary | ICD-10-CM

## 2019-06-25 DIAGNOSIS — I82220 Acute embolism and thrombosis of inferior vena cava: Secondary | ICD-10-CM | POA: Diagnosis not present

## 2019-06-25 DIAGNOSIS — M545 Low back pain: Secondary | ICD-10-CM | POA: Diagnosis not present

## 2019-06-25 DIAGNOSIS — C774 Secondary and unspecified malignant neoplasm of inguinal and lower limb lymph nodes: Secondary | ICD-10-CM | POA: Diagnosis not present

## 2019-06-25 MED ORDER — SODIUM CHLORIDE 0.9% FLUSH
10.0000 mL | INTRAVENOUS | Status: DC | PRN
  Administered 2019-06-25: 10 mL via INTRAVENOUS
  Filled 2019-06-25: qty 10

## 2019-06-25 MED ORDER — HEPARIN SOD (PORK) LOCK FLUSH 100 UNIT/ML IV SOLN
500.0000 [IU] | Freq: Once | INTRAVENOUS | Status: AC | PRN
  Administered 2019-06-25: 500 [IU] via INTRAVENOUS
  Filled 2019-06-25: qty 5

## 2019-06-25 NOTE — Patient Instructions (Signed)

## 2019-06-28 ENCOUNTER — Other Ambulatory Visit: Payer: Self-pay | Admitting: Oncology

## 2019-06-28 ENCOUNTER — Telehealth: Payer: Self-pay | Admitting: *Deleted

## 2019-06-28 DIAGNOSIS — C189 Malignant neoplasm of colon, unspecified: Secondary | ICD-10-CM

## 2019-06-28 MED ORDER — OXYCODONE HCL 5 MG PO TABS: 5.0000 mg | ORAL_TABLET | ORAL | 0 refills | Status: DC | PRN

## 2019-06-28 NOTE — Telephone Encounter (Signed)
Called to request refill on oxycodone: takes #2 almost every 4 hours. Pain ranges from "10" to "5" after pain med. Does not want to change to a different pain med at this time. Has #4 tabs on hand currently. Also having constipation: takes MiraLax daily. Instructed her to add her colace 100 mg bid to regimen and she may need to continue this as long as she is on pain medication.

## 2019-07-04 ENCOUNTER — Other Ambulatory Visit: Payer: Self-pay | Admitting: Oncology

## 2019-07-06 ENCOUNTER — Inpatient Hospital Stay: Payer: Medicare Other | Admitting: Nurse Practitioner

## 2019-07-06 ENCOUNTER — Telehealth: Payer: Self-pay

## 2019-07-06 NOTE — Telephone Encounter (Signed)
TC to pt per Lattie Haw to let her know that she was scheduled for an appointment today and Thursday. So unless she is having some kind of issue where she would need to come in today, she can just come in on the Thursday appointment @9 :45 since she has chemo scheduled on that day also. Patient stated that she wasn't having any issues and that coming in on Thursday 07/08/19 @9 :45 would be fine.

## 2019-07-07 ENCOUNTER — Other Ambulatory Visit: Payer: Self-pay | Admitting: *Deleted

## 2019-07-07 DIAGNOSIS — C787 Secondary malignant neoplasm of liver and intrahepatic bile duct: Secondary | ICD-10-CM

## 2019-07-07 DIAGNOSIS — C189 Malignant neoplasm of colon, unspecified: Secondary | ICD-10-CM

## 2019-07-08 ENCOUNTER — Inpatient Hospital Stay: Payer: Medicare Other

## 2019-07-08 ENCOUNTER — Other Ambulatory Visit: Payer: Self-pay

## 2019-07-08 ENCOUNTER — Inpatient Hospital Stay: Payer: Medicare Other | Attending: Oncology | Admitting: Nurse Practitioner

## 2019-07-08 ENCOUNTER — Encounter: Payer: Self-pay | Admitting: Nurse Practitioner

## 2019-07-08 VITALS — BP 123/73 | HR 87 | Temp 98.9°F | Resp 18 | Ht 66.0 in | Wt 107.2 lb

## 2019-07-08 DIAGNOSIS — Z7289 Other problems related to lifestyle: Secondary | ICD-10-CM | POA: Insufficient documentation

## 2019-07-08 DIAGNOSIS — F17210 Nicotine dependence, cigarettes, uncomplicated: Secondary | ICD-10-CM | POA: Insufficient documentation

## 2019-07-08 DIAGNOSIS — Z5111 Encounter for antineoplastic chemotherapy: Secondary | ICD-10-CM | POA: Diagnosis not present

## 2019-07-08 DIAGNOSIS — Z923 Personal history of irradiation: Secondary | ICD-10-CM | POA: Insufficient documentation

## 2019-07-08 DIAGNOSIS — R110 Nausea: Secondary | ICD-10-CM

## 2019-07-08 DIAGNOSIS — C786 Secondary malignant neoplasm of retroperitoneum and peritoneum: Secondary | ICD-10-CM | POA: Diagnosis not present

## 2019-07-08 DIAGNOSIS — Z95828 Presence of other vascular implants and grafts: Secondary | ICD-10-CM

## 2019-07-08 DIAGNOSIS — D701 Agranulocytosis secondary to cancer chemotherapy: Secondary | ICD-10-CM | POA: Diagnosis not present

## 2019-07-08 DIAGNOSIS — C189 Malignant neoplasm of colon, unspecified: Secondary | ICD-10-CM

## 2019-07-08 DIAGNOSIS — G893 Neoplasm related pain (acute) (chronic): Secondary | ICD-10-CM | POA: Insufficient documentation

## 2019-07-08 DIAGNOSIS — M545 Low back pain: Secondary | ICD-10-CM | POA: Insufficient documentation

## 2019-07-08 DIAGNOSIS — Z79899 Other long term (current) drug therapy: Secondary | ICD-10-CM

## 2019-07-08 DIAGNOSIS — C778 Secondary and unspecified malignant neoplasm of lymph nodes of multiple regions: Secondary | ICD-10-CM | POA: Diagnosis not present

## 2019-07-08 DIAGNOSIS — T451X5S Adverse effect of antineoplastic and immunosuppressive drugs, sequela: Secondary | ICD-10-CM | POA: Diagnosis not present

## 2019-07-08 LAB — CBC WITH DIFFERENTIAL (CANCER CENTER ONLY)
Abs Immature Granulocytes: 0.01 10*3/uL (ref 0.00–0.07)
Basophils Absolute: 0 10*3/uL (ref 0.0–0.1)
Basophils Relative: 0 %
Eosinophils Absolute: 0.8 10*3/uL — ABNORMAL HIGH (ref 0.0–0.5)
Eosinophils Relative: 21 %
HCT: 37.5 % (ref 36.0–46.0)
Hemoglobin: 12.4 g/dL (ref 12.0–15.0)
Immature Granulocytes: 0 %
Lymphocytes Relative: 32 %
Lymphs Abs: 1.2 10*3/uL (ref 0.7–4.0)
MCH: 31.9 pg (ref 26.0–34.0)
MCHC: 33.1 g/dL (ref 30.0–36.0)
MCV: 96.4 fL (ref 80.0–100.0)
Monocytes Absolute: 0.3 10*3/uL (ref 0.1–1.0)
Monocytes Relative: 7 %
Neutro Abs: 1.4 10*3/uL — ABNORMAL LOW (ref 1.7–7.7)
Neutrophils Relative %: 40 %
Platelet Count: 164 10*3/uL (ref 150–400)
RBC: 3.89 MIL/uL (ref 3.87–5.11)
RDW: 13 % (ref 11.5–15.5)
WBC Count: 3.7 10*3/uL — ABNORMAL LOW (ref 4.0–10.5)
nRBC: 0 % (ref 0.0–0.2)

## 2019-07-08 LAB — CMP (CANCER CENTER ONLY)
ALT: 18 U/L (ref 0–44)
AST: 19 U/L (ref 15–41)
Albumin: 3.2 g/dL — ABNORMAL LOW (ref 3.5–5.0)
Alkaline Phosphatase: 114 U/L (ref 38–126)
Anion gap: 9 (ref 5–15)
BUN: 18 mg/dL (ref 8–23)
CO2: 26 mmol/L (ref 22–32)
Calcium: 8.4 mg/dL — ABNORMAL LOW (ref 8.9–10.3)
Chloride: 104 mmol/L (ref 98–111)
Creatinine: 0.79 mg/dL (ref 0.44–1.00)
GFR, Est AFR Am: >60 mL/min (ref >60)
GFR, Estimated: >60 mL/min (ref >60)
Glucose, Bld: 105 mg/dL — ABNORMAL HIGH (ref 70–99)
Potassium: 4 mmol/L (ref 3.5–5.1)
Sodium: 139 mmol/L (ref 135–145)
Total Bilirubin: 0.3 mg/dL (ref 0.3–1.2)
Total Protein: 6.6 g/dL (ref 6.5–8.1)

## 2019-07-08 MED ORDER — ATROPINE SULFATE 1 MG/ML IJ SOLN: 0.4000 mg | Freq: Once | INTRAMUSCULAR | Status: DC

## 2019-07-08 MED ORDER — OXYCODONE HCL 5 MG PO TABS: 5.0000 mg | ORAL_TABLET | ORAL | 0 refills | Status: DC | PRN

## 2019-07-08 MED ORDER — SODIUM CHLORIDE 0.9 % IV SOLN
Freq: Once | INTRAVENOUS | Status: AC
  Administered 2019-07-08: 12:00:00 via INTRAVENOUS
  Filled 2019-07-08: qty 250

## 2019-07-08 MED ORDER — SODIUM CHLORIDE 0.9% FLUSH
10.0000 mL | INTRAVENOUS | Status: DC | PRN
  Filled 2019-07-08: qty 10

## 2019-07-08 MED ORDER — PALONOSETRON HCL INJECTION 0.25 MG/5ML
0.2500 mg | Freq: Once | INTRAVENOUS | Status: AC
  Administered 2019-07-08: 0.25 mg via INTRAVENOUS

## 2019-07-08 MED ORDER — PALONOSETRON HCL INJECTION 0.25 MG/5ML
INTRAVENOUS | Status: AC
  Filled 2019-07-08: qty 5

## 2019-07-08 MED ORDER — FLUOROURACIL CHEMO INJECTION 2.5 GM/50ML
400.0000 mg/m2 | Freq: Once | INTRAVENOUS | Status: AC
  Administered 2019-07-08: 600 mg via INTRAVENOUS
  Filled 2019-07-08: qty 12

## 2019-07-08 MED ORDER — DEXAMETHASONE SODIUM PHOSPHATE 10 MG/ML IJ SOLN: 10.0000 mg | Freq: Once | INTRAMUSCULAR | Status: DC

## 2019-07-08 MED ORDER — SODIUM CHLORIDE 0.9 % IV SOLN
2400.0000 mg/m2 | INTRAVENOUS | Status: DC
  Administered 2019-07-08: 14:00:00 3650 mg via INTRAVENOUS
  Filled 2019-07-08: qty 73

## 2019-07-08 MED ORDER — DEXAMETHASONE SODIUM PHOSPHATE 10 MG/ML IJ SOLN
INTRAMUSCULAR | Status: AC
  Filled 2019-07-08: qty 1

## 2019-07-08 MED ORDER — ATROPINE SULFATE 1 MG/ML IJ SOLN: 0.5000 mg | Freq: Once | INTRAMUSCULAR | Status: DC | PRN

## 2019-07-08 MED ORDER — IRINOTECAN HCL CHEMO INJECTION 100 MG/5ML
180.0000 mg/m2 | Freq: Once | INTRAVENOUS | Status: AC
  Administered 2019-07-08: 280 mg via INTRAVENOUS
  Filled 2019-07-08: qty 14

## 2019-07-08 MED ORDER — SODIUM CHLORIDE 0.9% FLUSH
10.0000 mL | INTRAVENOUS | Status: DC | PRN
  Administered 2019-07-08: 10 mL via INTRAVENOUS
  Filled 2019-07-08: qty 10

## 2019-07-08 MED ORDER — ATROPINE SULFATE 1 MG/ML IJ SOLN
INTRAMUSCULAR | Status: AC
  Filled 2019-07-08: qty 1

## 2019-07-08 MED ORDER — LEUCOVORIN CALCIUM INJECTION 350 MG
400.0000 mg/m2 | Freq: Once | INTRAVENOUS | Status: AC
  Administered 2019-07-08: 12:00:00 612 mg via INTRAVENOUS
  Filled 2019-07-08: qty 30.6

## 2019-07-08 MED ORDER — HEPARIN SOD (PORK) LOCK FLUSH 100 UNIT/ML IV SOLN
500.0000 [IU] | Freq: Once | INTRAVENOUS | Status: DC | PRN
  Filled 2019-07-08: qty 5

## 2019-07-08 MED ORDER — SODIUM CHLORIDE 0.9 % IV SOLN
Freq: Once | INTRAVENOUS | Status: AC
  Administered 2019-07-08: 11:00:00 via INTRAVENOUS
  Filled 2019-07-08: qty 250

## 2019-07-08 MED FILL — oxyCODONE HCL 5 MG TABS: 5 | 5 days supply | Qty: 60 | Fill #0

## 2019-07-08 NOTE — Progress Notes (Signed)
Per Lattie Haw gave patient information and voucher on wigs. Also let  Nurse Georgette Dover RN know that patient is ok to treat today with anc 1.4 and will be getting Udenyca injection when pump comes off.

## 2019-07-08 NOTE — Progress Notes (Signed)
[facility] OFFICE PROGRESS NOTE   Diagnosis: Colon cancer  INTERVAL HISTORY:   Yolanda Watson returns as scheduled.  She completed cycle 1 FOLFIRI 06/23/2019.  She denies nausea/vomiting except an episode of very mild nausea on one occasion.  No mouth sores.  No diarrhea.  On day 2 she developed increased pain at the left lower back.  The pain is better now.  She takes oxycodone as needed.  Objective:  Vital signs in last 24 hours:  Blood pressure 123/73, pulse 87, temperature 98.9 F (37.2 C), temperature source Temporal, resp. rate 18, height '5\' 6"'$  (1.676 m), weight 107 lb 3.2 oz (48.6 kg), SpO2 98 %.    HEENT: No thrush or ulcers. Lymphatics: 1 cm firm lymph node left inguinal region. GI: Abdomen soft and nontender.  No hepatomegaly. Vascular: No leg edema. Skin: Palms without erythema. Port-A-Cath without erythema.   Lab Results:  Lab Results  Component Value Date   WBC 3.7 (L) 07/08/2019   HGB 12.4 07/08/2019   HCT 37.5 07/08/2019   MCV 96.4 07/08/2019   PLT 164 07/08/2019   NEUTROABS 1.4 (L) 07/08/2019    Imaging:  No results found.  Medications: I have reviewed the patient's current medications.  Assessment/Plan: 1. Metastatic colon cancer presenting with an Abdominal/pelvic mass  2. [place]pathology report dated 02/15/2015-invasive adenocarcinoma, well-differentiated with prominent mucinous features; microscopic tumor extension into mesenteric adipose tissue and focally involving visceral peritoneum; radial and mucosal margins negative; lymph-vascular invasion withsuspicious foci identified;perineural invasion not identified; 16 negative lymph nodes; tumor deposits not identified; pT4apN0;MSI stable  ? CT 04/22/2017 revealed a heterogenous anterior pelvic wall mass, right inguinal mass, top normal sized retroperitoneal and iliac nodes, abutment of the anterior dome of the bladder and a loop of small bowel  ? 04/29/2017 biopsy of anterior abdominal/pelvic wall mass-acellular mucin dissecting fibrous stroma ? Cycle 1 FOLFOX 05/15/2017 ? Cycle 2 FOLFOX 05/28/2017 ? Cycle 3 FOLFOX with Avastin on 06/11/2017 ? Cycle 4 FOLFOX/Avastin 06/25/2017 ? Cycle 5 FOLFOX/Avastin 07/10/2017, Neulasta added ? Restaging CT 07/21/2017-mild decrease in the size of a right inguinal node and the anterior abdominal wall mass ? Radiation to the abdominal wall mass 08/18/2017-09/03/2017 ? Status post excision of abdominal wall mass 10/03/2017--pathology showed adenocarcinoma with excessive extracellular mucin. Margins negative for tumor. Right inguinal wall mass also excised showing metastatic adenocarcinoma with excessive extracellular mucin in 1 of 8 lymph nodes. ? CT abdomen/pelvis 06/16/2019- new right hepatic lobe lesion measuring 2.7 x 1.7 cm. Hypoattenuation within the pancreatic uncinate process measuring 1.5 x 1.6 cm.  Mild left adrenal thickening.  Upper pole left renal mass 3.0 x 3.6 cm.  Infiltrative hypoenhancement involving the lower pole left kidney.  Nonocclusive thrombus within the infrarenal IVC.  Necrotic adenopathy within the small bowel mesentery, new.  New necrotic left inguinal nodes.  Lytic lesion within and adjacent the anterior side of the L3 vertebral body.  Contiguous mass within the left psoas measuring 4.6 x 3.4 cm. ? Cycle 1 FOLFIRI 06/23/2019 ? Cycle 2 FOLFIRI 07/08/2019, Udenyca added   3. Ongoing tobacco and alcohol use  4. Mild neutropenia related to chemotherapy 07/10/2017. Neulasta added.  5.Admission January 2019 with an abdominal wall abscess and pubis osteomyelitis, status post debridement and antibiotics   6.         Pain secondary to #1. 7.         CT 06/16/2019 with nonocclusive thrombus infrarenal IVC.  Anticoagulation initiated.  Disposition: Yolanda Watson  appears stable.  She has completed 1 cycle of FOLFIRI.  Overall she tolerated well. Plan to proceed with cycle 2 today  as scheduled.  She has mild neutropenia and will receive Udenyca on the day of pump discontinuation.  We reviewed potential toxicities associated with Udenyca including bone pain, skin rash, splenic rupture.  She agrees to proceed.  She will return for lab, follow-up and cycle 3 FOLFIRI in 2 weeks.  She will contact the office in the interim with any problems.  We specifically discussed fever, chills, other signs of infection.    Ned Card ANP/GNP-BC   07/08/2019  10:11 AM

## 2019-07-08 NOTE — Progress Notes (Signed)
Pt having constipation at home and using miralax. Prefers to hold atropine prior to irinotecan. Will monitor and give if necessary.

## 2019-07-08 NOTE — Patient Instructions (Signed)
[facility] Discharge Instructions for Patients Receiving Chemotherapy  Today you received the following chemotherapy agents Irinotecan, Leucovorin, and Fluorouracil  To help prevent nausea and vomiting after your treatment, we encourage you to take your nausea medication as directed.   If you develop nausea and vomiting that is not controlled by your nausea medication, call the clinic.   BELOW ARE SYMPTOMS THAT SHOULD BE REPORTED IMMEDIATELY:  *FEVER GREATER THAN 100.5 F  *CHILLS WITH OR WITHOUT FEVER  NAUSEA AND VOMITING THAT IS NOT CONTROLLED WITH YOUR NAUSEA MEDICATION  *UNUSUAL SHORTNESS OF BREATH  *UNUSUAL BRUISING OR BLEEDING  TENDERNESS IN MOUTH AND THROAT WITH OR WITHOUT PRESENCE OF ULCERS  *URINARY PROBLEMS  *BOWEL PROBLEMS  UNUSUAL RASH Items with * indicate a potential emergency and should be followed up as soon as possible.  Feel free to call the clinic should you have any questions or concerns. The clinic phone number is [phone].  Please show the CHEMO ALERT CARD at check-in to the Emergency Department and triage nurse.   

## 2019-07-09 ENCOUNTER — Telehealth: Payer: Self-pay | Admitting: Oncology

## 2019-07-09 NOTE — Telephone Encounter (Signed)
Called and spoke with patient. Confirmed dates and times °

## 2019-07-10 ENCOUNTER — Inpatient Hospital Stay: Payer: Medicare Other

## 2019-07-10 ENCOUNTER — Other Ambulatory Visit: Payer: Self-pay

## 2019-07-10 VITALS — BP 130/69 | HR 83 | Temp 98.7°F | Resp 18

## 2019-07-10 DIAGNOSIS — C778 Secondary and unspecified malignant neoplasm of lymph nodes of multiple regions: Secondary | ICD-10-CM | POA: Diagnosis not present

## 2019-07-10 DIAGNOSIS — C189 Malignant neoplasm of colon, unspecified: Secondary | ICD-10-CM | POA: Diagnosis not present

## 2019-07-10 DIAGNOSIS — C786 Secondary malignant neoplasm of retroperitoneum and peritoneum: Secondary | ICD-10-CM | POA: Diagnosis not present

## 2019-07-10 DIAGNOSIS — Z923 Personal history of irradiation: Secondary | ICD-10-CM | POA: Diagnosis not present

## 2019-07-10 DIAGNOSIS — D701 Agranulocytosis secondary to cancer chemotherapy: Secondary | ICD-10-CM | POA: Diagnosis not present

## 2019-07-10 DIAGNOSIS — Z5111 Encounter for antineoplastic chemotherapy: Secondary | ICD-10-CM | POA: Diagnosis not present

## 2019-07-10 DIAGNOSIS — D702 Other drug-induced agranulocytosis: Secondary | ICD-10-CM

## 2019-07-10 MED ORDER — PEGFILGRASTIM-CBQV 6 MG/0.6ML ~~LOC~~ SOSY
6.0000 mg | PREFILLED_SYRINGE | Freq: Once | SUBCUTANEOUS | Status: AC
  Administered 2019-07-10: 6 mg via SUBCUTANEOUS

## 2019-07-10 MED ORDER — HEPARIN SOD (PORK) LOCK FLUSH 100 UNIT/ML IV SOLN
500.0000 [IU] | Freq: Once | INTRAVENOUS | Status: AC | PRN
  Administered 2019-07-10: 500 [IU]
  Filled 2019-07-10: qty 5

## 2019-07-10 MED ORDER — PEGFILGRASTIM-CBQV 6 MG/0.6ML ~~LOC~~ SOSY
PREFILLED_SYRINGE | SUBCUTANEOUS | Status: AC
  Filled 2019-07-10: qty 0.6

## 2019-07-10 MED ORDER — SODIUM CHLORIDE 0.9% FLUSH
10.0000 mL | INTRAVENOUS | Status: DC | PRN
  Administered 2019-07-10: 10 mL
  Filled 2019-07-10: qty 10

## 2019-07-10 NOTE — Patient Instructions (Signed)

## 2019-07-12 DIAGNOSIS — C182 Malignant neoplasm of ascending colon: Secondary | ICD-10-CM | POA: Diagnosis not present

## 2019-07-17 ENCOUNTER — Other Ambulatory Visit: Payer: Self-pay | Admitting: Oncology

## 2019-07-19 ENCOUNTER — Other Ambulatory Visit: Payer: Self-pay | Admitting: Nurse Practitioner

## 2019-07-19 DIAGNOSIS — C189 Malignant neoplasm of colon, unspecified: Secondary | ICD-10-CM

## 2019-07-19 DIAGNOSIS — C787 Secondary malignant neoplasm of liver and intrahepatic bile duct: Secondary | ICD-10-CM

## 2019-07-19 MED ORDER — OXYCODONE HCL 5 MG PO TABS: 5.0000 mg | ORAL_TABLET | ORAL | 0 refills | Status: DC | PRN

## 2019-07-19 MED FILL — oxyCODONE HCL 5 MG TABS: 5 | 60 days supply | Qty: 60 | Fill #0

## 2019-07-22 ENCOUNTER — Inpatient Hospital Stay (HOSPITAL_BASED_OUTPATIENT_CLINIC_OR_DEPARTMENT_OTHER): Payer: Medicare Other | Admitting: Nurse Practitioner

## 2019-07-22 ENCOUNTER — Telehealth: Payer: Self-pay | Admitting: Oncology

## 2019-07-22 ENCOUNTER — Encounter: Payer: Self-pay | Admitting: Nurse Practitioner

## 2019-07-22 ENCOUNTER — Other Ambulatory Visit: Payer: Self-pay

## 2019-07-22 ENCOUNTER — Telehealth: Payer: Self-pay

## 2019-07-22 ENCOUNTER — Inpatient Hospital Stay: Payer: Medicare Other

## 2019-07-22 VITALS — BP 121/75 | HR 95 | Temp 98.7°F | Resp 18 | Ht 66.0 in | Wt 104.8 lb

## 2019-07-22 DIAGNOSIS — C786 Secondary malignant neoplasm of retroperitoneum and peritoneum: Secondary | ICD-10-CM

## 2019-07-22 DIAGNOSIS — D701 Agranulocytosis secondary to cancer chemotherapy: Secondary | ICD-10-CM | POA: Diagnosis not present

## 2019-07-22 DIAGNOSIS — C787 Secondary malignant neoplasm of liver and intrahepatic bile duct: Secondary | ICD-10-CM

## 2019-07-22 DIAGNOSIS — C778 Secondary and unspecified malignant neoplasm of lymph nodes of multiple regions: Secondary | ICD-10-CM

## 2019-07-22 DIAGNOSIS — F17210 Nicotine dependence, cigarettes, uncomplicated: Secondary | ICD-10-CM | POA: Diagnosis not present

## 2019-07-22 DIAGNOSIS — C189 Malignant neoplasm of colon, unspecified: Secondary | ICD-10-CM

## 2019-07-22 DIAGNOSIS — Z5111 Encounter for antineoplastic chemotherapy: Secondary | ICD-10-CM | POA: Diagnosis not present

## 2019-07-22 DIAGNOSIS — Z95828 Presence of other vascular implants and grafts: Secondary | ICD-10-CM

## 2019-07-22 DIAGNOSIS — Z923 Personal history of irradiation: Secondary | ICD-10-CM | POA: Diagnosis not present

## 2019-07-22 LAB — CMP (CANCER CENTER ONLY)
ALT: 9 U/L (ref 0–44)
AST: 9 U/L — ABNORMAL LOW (ref 15–41)
Albumin: 3.1 g/dL — ABNORMAL LOW (ref 3.5–5.0)
Alkaline Phosphatase: 154 U/L — ABNORMAL HIGH (ref 38–126)
Anion gap: 10 (ref 5–15)
BUN: 16 mg/dL (ref 8–23)
CO2: 25 mmol/L (ref 22–32)
Calcium: 8.7 mg/dL — ABNORMAL LOW (ref 8.9–10.3)
Chloride: 103 mmol/L (ref 98–111)
Creatinine: 0.79 mg/dL (ref 0.44–1.00)
GFR, Est AFR Am: >60 mL/min
GFR, Estimated: >60 mL/min
Glucose, Bld: 124 mg/dL — ABNORMAL HIGH (ref 70–99)
Potassium: 3.3 mmol/L — ABNORMAL LOW (ref 3.5–5.1)
Sodium: 138 mmol/L (ref 135–145)
Total Bilirubin: 0.3 mg/dL (ref 0.3–1.2)
Total Protein: 6.8 g/dL (ref 6.5–8.1)

## 2019-07-22 LAB — CBC WITH DIFFERENTIAL (CANCER CENTER ONLY)
Abs Immature Granulocytes: 0.31 10*3/uL — ABNORMAL HIGH (ref 0.00–0.07)
Basophils Absolute: 0.1 10*3/uL (ref 0.0–0.1)
Basophils Relative: 1 %
Eosinophils Absolute: 0.4 10*3/uL (ref 0.0–0.5)
Eosinophils Relative: 2 %
HCT: 36.1 % (ref 36.0–46.0)
Hemoglobin: 12.1 g/dL (ref 12.0–15.0)
Immature Granulocytes: 2 %
Lymphocytes Relative: 8 %
Lymphs Abs: 1.4 10*3/uL (ref 0.7–4.0)
MCH: 31.9 pg (ref 26.0–34.0)
MCHC: 33.5 g/dL (ref 30.0–36.0)
MCV: 95.3 fL (ref 80.0–100.0)
Monocytes Absolute: 0.8 10*3/uL (ref 0.1–1.0)
Monocytes Relative: 5 %
Neutro Abs: 14.6 10*3/uL — ABNORMAL HIGH (ref 1.7–7.7)
Neutrophils Relative %: 82 %
Platelet Count: 148 10*3/uL — ABNORMAL LOW (ref 150–400)
RBC: 3.79 MIL/uL — ABNORMAL LOW (ref 3.87–5.11)
RDW: 13.9 % (ref 11.5–15.5)
WBC Count: 17.6 10*3/uL — ABNORMAL HIGH (ref 4.0–10.5)
nRBC: 0 % (ref 0.0–0.2)

## 2019-07-22 MED ORDER — IRINOTECAN HCL CHEMO INJECTION 100 MG/5ML
180.0000 mg/m2 | Freq: Once | INTRAVENOUS | Status: AC
  Administered 2019-07-22: 280 mg via INTRAVENOUS
  Filled 2019-07-22: qty 14

## 2019-07-22 MED ORDER — PALONOSETRON HCL INJECTION 0.25 MG/5ML
INTRAVENOUS | Status: AC
  Filled 2019-07-22: qty 5

## 2019-07-22 MED ORDER — DEXAMETHASONE SODIUM PHOSPHATE 10 MG/ML IJ SOLN
INTRAMUSCULAR | Status: AC
  Filled 2019-07-22: qty 1

## 2019-07-22 MED ORDER — FLUOROURACIL CHEMO INJECTION 2.5 GM/50ML
400.0000 mg/m2 | Freq: Once | INTRAVENOUS | Status: AC
  Administered 2019-07-22: 12:00:00 600 mg via INTRAVENOUS
  Filled 2019-07-22: qty 12

## 2019-07-22 MED ORDER — ATROPINE SULFATE 1 MG/ML IJ SOLN: 0.5000 mg | Freq: Once | INTRAMUSCULAR | Status: DC | PRN

## 2019-07-22 MED ORDER — DEXAMETHASONE SODIUM PHOSPHATE 10 MG/ML IJ SOLN
10.0000 mg | Freq: Once | INTRAMUSCULAR | Status: AC
  Administered 2019-07-22: 10 mg via INTRAVENOUS

## 2019-07-22 MED ORDER — PALONOSETRON HCL INJECTION 0.25 MG/5ML
0.2500 mg | Freq: Once | INTRAVENOUS | Status: AC
  Administered 2019-07-22: 0.25 mg via INTRAVENOUS

## 2019-07-22 MED ORDER — LEUCOVORIN CALCIUM INJECTION 350 MG
400.0000 mg/m2 | Freq: Once | INTRAVENOUS | Status: AC
  Administered 2019-07-22: 612 mg via INTRAVENOUS
  Filled 2019-07-22: qty 30.6

## 2019-07-22 MED ORDER — SODIUM CHLORIDE 0.9% FLUSH
10.0000 mL | INTRAVENOUS | Status: DC | PRN
  Administered 2019-07-22: 10 mL via INTRAVENOUS
  Filled 2019-07-22: qty 10

## 2019-07-22 MED ORDER — SODIUM CHLORIDE 0.9 % IV SOLN
2400.0000 mg/m2 | INTRAVENOUS | Status: DC
  Administered 2019-07-22: 3650 mg via INTRAVENOUS
  Filled 2019-07-22: qty 73

## 2019-07-22 MED ORDER — SODIUM CHLORIDE 0.9 % IV SOLN
Freq: Once | INTRAVENOUS | Status: AC
  Administered 2019-07-22: 10:00:00 via INTRAVENOUS
  Filled 2019-07-22: qty 250

## 2019-07-22 NOTE — Telephone Encounter (Signed)
Gave avs and calendar ° °

## 2019-07-22 NOTE — Patient Instructions (Signed)
[facility] Discharge Instructions for Patients Receiving Chemotherapy  Today you received the following chemotherapy agents: Irinotecan (Camptosar), Leucovorin, Fluorouracil (Adrucil, 5-FU)  To help prevent nausea and vomiting after your treatment, we encourage you to take your nausea medication as directed.    If you develop nausea and vomiting that is not controlled by your nausea medication, call the clinic.   BELOW ARE SYMPTOMS THAT SHOULD BE REPORTED IMMEDIATELY:  *FEVER GREATER THAN 100.5 F  *CHILLS WITH OR WITHOUT FEVER  NAUSEA AND VOMITING THAT IS NOT CONTROLLED WITH YOUR NAUSEA MEDICATION  *UNUSUAL SHORTNESS OF BREATH  *UNUSUAL BRUISING OR BLEEDING  TENDERNESS IN MOUTH AND THROAT WITH OR WITHOUT PRESENCE OF ULCERS  *URINARY PROBLEMS  *BOWEL PROBLEMS  UNUSUAL RASH Items with * indicate a potential emergency and should be followed up as soon as possible.  Feel free to call the clinic should you have any questions or concerns. The clinic phone number is [phone].  Please show the [facility] at check-in to the Emergency Department and triage nurse.  Coronavirus (COVID-19) Are you at risk?  Are you at risk for the Coronavirus (COVID-19)?  To be considered HIGH RISK for Coronavirus (COVID-19), you have to meet the following criteria:  . Traveled to [place], [place], [place], [place] or [place]; or in the [place] to [place], [place], [place], or [place]; and have fever, cough, and shortness of breath within the last 2 weeks of travel OR . Been in close contact with a person diagnosed with COVID-19 within the last 2 weeks and have fever, cough, and shortness of breath . IF YOU DO NOT MEET THESE CRITERIA, YOU ARE CONSIDERED LOW RISK FOR COVID-19.  What to do if you are HIGH RISK for COVID-19?  Marland Kitchen If you are having a medical emergency, call 911. . Seek medical care right away. Before you go to a doctor's office, urgent care or  emergency department, call ahead and tell them about your recent travel, contact with someone diagnosed with COVID-19, and your symptoms. You should receive instructions from your physician's office regarding next steps of care.  . When you arrive at healthcare provider, tell the healthcare staff immediately you have returned from visiting [place], [place], [place], [place] or [place]; or traveled in the [place] to [place], [place], [place], or [place]; in the last two weeks or you have been in close contact with a person diagnosed with COVID-19 in the last 2 weeks.   . Tell the health care staff about your symptoms: fever, cough and shortness of breath. . After you have been seen by a medical provider, you will be either: o Tested for (COVID-19) and discharged home on quarantine except to seek medical care if symptoms worsen, and asked to  - Stay home and avoid contact with others until you get your results (4-5 days)  - Avoid travel on public transportation if possible (such as bus, train, or airplane) or o Sent to the Emergency Department by EMS for evaluation, COVID-19 testing, and possible admission depending on your condition and test results.  What to do if you are LOW RISK for COVID-19?  Reduce your risk of any infection by using the same precautions used for avoiding the common cold or flu:  Marland Kitchen Wash your hands often with soap and warm water for at least 20 seconds.  If soap and water are not readily available, use an alcohol-based hand sanitizer with at least 60% alcohol.  Marland Kitchen  If coughing or sneezing, cover your mouth and nose by coughing or sneezing into the elbow areas of your shirt or coat, into a tissue or into your sleeve (not your hands). . Avoid shaking hands with others and consider head nods or verbal greetings only. . Avoid touching your eyes, nose, or mouth with unwashed hands.  . Avoid close contact with people who are sick. . Avoid places or events with large numbers  of people in one location, like concerts or sporting events. . Carefully consider travel plans you have or are making. . If you are planning any travel outside or inside the [place], visit the CDC's Travelers' Health webpage for the latest health notices. . If you have some symptoms but not all symptoms, continue to monitor at home and seek medical attention if your symptoms worsen. . If you are having a medical emergency, call 911.   [place] [facility] / e-Visit: [email or web address]         [facility]: [facility]: [phone]                   [facility]: [phone]

## 2019-07-22 NOTE — Progress Notes (Signed)
[facility] OFFICE PROGRESS NOTE   Diagnosis: Colon cancer  INTERVAL HISTORY:   Yolanda Watson returns as scheduled.  She completed cycle 2 FOLFIRI 07/08/2019.  She had mild nausea on 1 day.  No mouth sores.  No diarrhea.  Energy level was decreased for about a week.  She did not experience bone pain following the Udenyca injection.  Back and left side pain are better.  She is taking less pain pills.  She reports a good appetite.  Objective:  Vital signs in last 24 hours:  Blood pressure 121/75, pulse 95, temperature 98.7 F (37.1 C), temperature source Oral, resp. rate 18, height '5\' 6"'$  (1.676 m), weight 104 lb 12.8 oz (47.5 kg), SpO2 97 %.    HEENT: No thrush or ulcers. Lymphatics: Approximate 1 cm left inguinal lymph node.  No right inguinal adenopathy. GI: Abdomen soft and nontender.  No hepatomegaly. Vascular: No leg edema. Port-A-Cath without erythema.  Lab Results:  Lab Results  Component Value Date   WBC 17.6 (H) 07/22/2019   HGB 12.1 07/22/2019   HCT 36.1 07/22/2019   MCV 95.3 07/22/2019   PLT 148 (L) 07/22/2019   NEUTROABS 14.6 (H) 07/22/2019    Imaging:  No results found.  Medications: I have reviewed the patient's current medications.  Assessment/Plan: 1. Metastatic colon cancer presenting with an Abdominal/pelvic mass  2. [place]pathology report dated 02/15/2015-invasive adenocarcinoma, well-differentiated with prominent mucinous features; microscopic tumor extension into mesenteric adipose tissue and focally involving visceral peritoneum; radial and mucosal margins negative; lymph-vascular invasion withsuspicious foci identified;perineural invasion not identified; 16 negative lymph nodes; tumor deposits not identified; pT4apN0;MSI stable  ? CT 04/22/2017 revealed a heterogenous anterior pelvic wall mass, right inguinal mass, top normal sized retroperitoneal and iliac nodes, abutment of the anterior dome  of the bladder and a loop of small bowel ? 04/29/2017 biopsy of anterior abdominal/pelvic wall mass-acellular mucin dissecting fibrous stroma ? Cycle 1 FOLFOX 05/15/2017 ? Cycle 2 FOLFOX 05/28/2017 ? Cycle 3 FOLFOX with Avastin on 06/11/2017 ? Cycle 4 FOLFOX/Avastin 06/25/2017 ? Cycle 5 FOLFOX/Avastin 07/10/2017, Neulasta added ? Restaging CT 07/21/2017-mild decrease in the size of a right inguinal node and the anterior abdominal wall mass ? Radiation to the abdominal wall mass 08/18/2017-09/03/2017 ? Status post excision of abdominal wall mass 10/03/2017--pathology showed adenocarcinoma with excessive extracellular mucin. Margins negative for tumor. Right inguinal wall mass also excised showing metastatic adenocarcinoma with excessive extracellular mucin in 1 of 8 lymph nodes. ? CT abdomen/pelvis 06/16/2019-new right hepatic lobe lesion measuring 2.7 x 1.7 cm. Hypoattenuation within the pancreatic uncinate process measuring 1.5 x 1.6 cm. Mild left adrenal thickening. Upper pole left renal mass 3.0 x 3.6 cm. Infiltrative hypoenhancement involving the lower pole left kidney. Nonocclusive thrombus within the infrarenal IVC. Necrotic adenopathy within the small bowel mesentery, new. New necrotic left inguinal nodes. Lytic lesion within and adjacent the anterior side of the L3 vertebral body. Contiguous mass within the left psoas measuring 4.6 x 3.4 cm. ? Cycle 1 FOLFIRI 06/23/2019 ? Cycle 2 FOLFIRI 07/08/2019, Udenyca added  ? Cycle 3 FOLFIRI 07/22/2019   3. Ongoing tobacco and alcohol use  4. Mild neutropenia related to chemotherapy 07/10/2017. Neulasta added.  5.Admission January 2019 with an abdominal wall abscess and pubis osteomyelitis, status post debridement and antibiotics  6.Pain secondary to #1. 7.CT 06/16/2019 with nonocclusive thrombus infrarenal IVC. Anticoagulation initiated.   Disposition: Ms. How appears stable.  She has completed 2 cycles  of FOLFIRI.  She is tolerating the chemotherapy well.  Her pain is better.  Plan to proceed with cycle 3 today as scheduled.  We reviewed the CBC from today.  Counts adequate to proceed with treatment.  We will hold Udenyca with this cycle due to the elevated absolute neutrophil count.  She will return for lab, follow-up, cycle 4 FOLFIRI in 2 weeks.  She will contact the office in the interim with any problems.    Ned Card ANP/GNP-BC   07/22/2019  9:28 AM

## 2019-07-22 NOTE — Telephone Encounter (Signed)
Per Patrica Duel to patient to let her know that her Potassium is mildly low. I asked if she is taking oral potassium? patient stated that she was not. I let her know that Lattie Haw wanted me to send in a prescription to her pharmacy for K-Dur 20 milliequivalents daily. I called in Prescription to [facility] on [address].

## 2019-07-24 ENCOUNTER — Other Ambulatory Visit: Payer: Self-pay

## 2019-07-24 ENCOUNTER — Inpatient Hospital Stay: Payer: Medicare Other

## 2019-07-24 VITALS — BP 126/51 | HR 65 | Temp 97.8°F | Resp 16

## 2019-07-24 DIAGNOSIS — Z923 Personal history of irradiation: Secondary | ICD-10-CM | POA: Diagnosis not present

## 2019-07-24 DIAGNOSIS — C786 Secondary malignant neoplasm of retroperitoneum and peritoneum: Secondary | ICD-10-CM | POA: Diagnosis not present

## 2019-07-24 DIAGNOSIS — Z5111 Encounter for antineoplastic chemotherapy: Secondary | ICD-10-CM | POA: Diagnosis not present

## 2019-07-24 DIAGNOSIS — D701 Agranulocytosis secondary to cancer chemotherapy: Secondary | ICD-10-CM | POA: Diagnosis not present

## 2019-07-24 DIAGNOSIS — C189 Malignant neoplasm of colon, unspecified: Secondary | ICD-10-CM | POA: Diagnosis not present

## 2019-07-24 DIAGNOSIS — C778 Secondary and unspecified malignant neoplasm of lymph nodes of multiple regions: Secondary | ICD-10-CM | POA: Diagnosis not present

## 2019-07-24 MED ORDER — HEPARIN SOD (PORK) LOCK FLUSH 100 UNIT/ML IV SOLN
500.0000 [IU] | Freq: Once | INTRAVENOUS | Status: AC | PRN
  Administered 2019-07-24: 500 [IU]
  Filled 2019-07-24: qty 5

## 2019-07-24 MED ORDER — SODIUM CHLORIDE 0.9% FLUSH
10.0000 mL | INTRAVENOUS | Status: DC | PRN
  Administered 2019-07-24: 10:00:00 10 mL
  Filled 2019-07-24: qty 10

## 2019-07-30 ENCOUNTER — Telehealth: Payer: Self-pay | Admitting: *Deleted

## 2019-07-30 MED ORDER — POTASSIUM CHLORIDE ER 10 MEQ PO TBCR: 20.0000 meq | EXTENDED_RELEASE_TABLET | Freq: Every day | ORAL | 0 refills | Status: DC

## 2019-07-30 NOTE — Telephone Encounter (Signed)
Reports she is not able to swallow the oral KDur 20 meq--too big. She agrees to try the 36meq and take #2 daily. [facility] sent [facility] per her request.

## 2019-08-01 ENCOUNTER — Other Ambulatory Visit: Payer: Self-pay | Admitting: Oncology

## 2019-08-03 ENCOUNTER — Other Ambulatory Visit: Payer: Self-pay | Admitting: Nurse Practitioner

## 2019-08-03 ENCOUNTER — Telehealth: Payer: Self-pay | Admitting: *Deleted

## 2019-08-03 DIAGNOSIS — C189 Malignant neoplasm of colon, unspecified: Secondary | ICD-10-CM

## 2019-08-03 DIAGNOSIS — C787 Secondary malignant neoplasm of liver and intrahepatic bile duct: Secondary | ICD-10-CM

## 2019-08-03 MED ORDER — OXYCODONE HCL 5 MG PO TABS: 5.0000 mg | ORAL_TABLET | ORAL | 0 refills | Status: DC | PRN

## 2019-08-03 NOTE — Telephone Encounter (Signed)
Patient calling for a refill on her Oxycodone please send to [facility] on [address].

## 2019-08-04 ENCOUNTER — Other Ambulatory Visit: Payer: Self-pay

## 2019-08-04 ENCOUNTER — Inpatient Hospital Stay: Payer: Medicare Other

## 2019-08-04 ENCOUNTER — Inpatient Hospital Stay: Payer: Medicare Other | Attending: Oncology | Admitting: Oncology

## 2019-08-04 VITALS — BP 108/66 | HR 82 | Temp 99.1°F | Resp 14 | Ht 66.0 in | Wt 104.4 lb

## 2019-08-04 DIAGNOSIS — C189 Malignant neoplasm of colon, unspecified: Secondary | ICD-10-CM

## 2019-08-04 DIAGNOSIS — C787 Secondary malignant neoplasm of liver and intrahepatic bile duct: Secondary | ICD-10-CM

## 2019-08-04 DIAGNOSIS — N2889 Other specified disorders of kidney and ureter: Secondary | ICD-10-CM | POA: Insufficient documentation

## 2019-08-04 DIAGNOSIS — C786 Secondary malignant neoplasm of retroperitoneum and peritoneum: Secondary | ICD-10-CM | POA: Insufficient documentation

## 2019-08-04 DIAGNOSIS — D701 Agranulocytosis secondary to cancer chemotherapy: Secondary | ICD-10-CM | POA: Insufficient documentation

## 2019-08-04 DIAGNOSIS — Z79899 Other long term (current) drug therapy: Secondary | ICD-10-CM | POA: Insufficient documentation

## 2019-08-04 DIAGNOSIS — R590 Localized enlarged lymph nodes: Secondary | ICD-10-CM | POA: Diagnosis not present

## 2019-08-04 DIAGNOSIS — Z5111 Encounter for antineoplastic chemotherapy: Secondary | ICD-10-CM | POA: Insufficient documentation

## 2019-08-04 DIAGNOSIS — C778 Secondary and unspecified malignant neoplasm of lymph nodes of multiple regions: Secondary | ICD-10-CM | POA: Insufficient documentation

## 2019-08-04 DIAGNOSIS — Z923 Personal history of irradiation: Secondary | ICD-10-CM | POA: Diagnosis not present

## 2019-08-04 DIAGNOSIS — T451X5A Adverse effect of antineoplastic and immunosuppressive drugs, initial encounter: Secondary | ICD-10-CM | POA: Insufficient documentation

## 2019-08-04 DIAGNOSIS — Z95828 Presence of other vascular implants and grafts: Secondary | ICD-10-CM

## 2019-08-04 LAB — CMP (CANCER CENTER ONLY)
ALT: 19 U/L (ref 0–44)
AST: 16 U/L (ref 15–41)
Albumin: 3.6 g/dL (ref 3.5–5.0)
Alkaline Phosphatase: 136 U/L — ABNORMAL HIGH (ref 38–126)
Anion gap: 11 (ref 5–15)
BUN: 14 mg/dL (ref 8–23)
CO2: 22 mmol/L (ref 22–32)
Calcium: 8.7 mg/dL — ABNORMAL LOW (ref 8.9–10.3)
Chloride: 107 mmol/L (ref 98–111)
Creatinine: 0.84 mg/dL (ref 0.44–1.00)
GFR, Est AFR Am: >60 mL/min (ref >60)
GFR, Estimated: >60 mL/min (ref >60)
Glucose, Bld: 94 mg/dL (ref 70–99)
Potassium: 3.5 mmol/L (ref 3.5–5.1)
Sodium: 140 mmol/L (ref 135–145)
Total Bilirubin: 0.5 mg/dL (ref 0.3–1.2)
Total Protein: 6.7 g/dL (ref 6.5–8.1)

## 2019-08-04 LAB — CBC WITH DIFFERENTIAL (CANCER CENTER ONLY)
Abs Immature Granulocytes: 0.01 10*3/uL (ref 0.00–0.07)
Basophils Absolute: 0 10*3/uL (ref 0.0–0.1)
Basophils Relative: 1 %
Eosinophils Absolute: 0.1 10*3/uL (ref 0.0–0.5)
Eosinophils Relative: 4 %
HCT: 34.9 % — ABNORMAL LOW (ref 36.0–46.0)
Hemoglobin: 11.5 g/dL — ABNORMAL LOW (ref 12.0–15.0)
Immature Granulocytes: 0 %
Lymphocytes Relative: 36 %
Lymphs Abs: 1.2 10*3/uL (ref 0.7–4.0)
MCH: 31.9 pg (ref 26.0–34.0)
MCHC: 33 g/dL (ref 30.0–36.0)
MCV: 96.7 fL (ref 80.0–100.0)
Monocytes Absolute: 0.3 10*3/uL (ref 0.1–1.0)
Monocytes Relative: 10 %
Neutro Abs: 1.6 10*3/uL — ABNORMAL LOW (ref 1.7–7.7)
Neutrophils Relative %: 49 %
Platelet Count: 156 10*3/uL (ref 150–400)
RBC: 3.61 MIL/uL — ABNORMAL LOW (ref 3.87–5.11)
RDW: 14.6 % (ref 11.5–15.5)
WBC Count: 3.3 10*3/uL — ABNORMAL LOW (ref 4.0–10.5)
nRBC: 0 % (ref 0.0–0.2)

## 2019-08-04 MED ORDER — SODIUM CHLORIDE 0.9% FLUSH
10.0000 mL | INTRAVENOUS | Status: DC | PRN
  Administered 2019-08-04: 11:00:00 10 mL via INTRAVENOUS
  Filled 2019-08-04: qty 10

## 2019-08-04 MED ORDER — PALONOSETRON HCL INJECTION 0.25 MG/5ML
0.2500 mg | Freq: Once | INTRAVENOUS | Status: AC
  Administered 2019-08-04: 0.25 mg via INTRAVENOUS

## 2019-08-04 MED ORDER — ATROPINE SULFATE 1 MG/ML IJ SOLN
INTRAMUSCULAR | Status: AC
  Filled 2019-08-04: qty 1

## 2019-08-04 MED ORDER — DEXAMETHASONE SODIUM PHOSPHATE 10 MG/ML IJ SOLN
INTRAMUSCULAR | Status: AC
  Filled 2019-08-04: qty 1

## 2019-08-04 MED ORDER — HEPARIN SOD (PORK) LOCK FLUSH 100 UNIT/ML IV SOLN
500.0000 [IU] | Freq: Once | INTRAVENOUS | Status: DC | PRN
  Filled 2019-08-04: qty 5

## 2019-08-04 MED ORDER — FLUOROURACIL CHEMO INJECTION 2.5 GM/50ML
400.0000 mg/m2 | Freq: Once | INTRAVENOUS | Status: AC
  Administered 2019-08-04: 600 mg via INTRAVENOUS
  Filled 2019-08-04: qty 12

## 2019-08-04 MED ORDER — LEUCOVORIN CALCIUM INJECTION 350 MG
400.0000 mg/m2 | Freq: Once | INTRAVENOUS | Status: AC
  Administered 2019-08-04: 13:00:00 612 mg via INTRAVENOUS
  Filled 2019-08-04: qty 30.6

## 2019-08-04 MED ORDER — RIVAROXABAN 20 MG PO TABS: 20.0000 mg | ORAL_TABLET | Freq: Every day | ORAL | 1 refills | Status: DC

## 2019-08-04 MED ORDER — DEXAMETHASONE SODIUM PHOSPHATE 10 MG/ML IJ SOLN
10.0000 mg | Freq: Once | INTRAMUSCULAR | Status: AC
  Administered 2019-08-04: 10 mg via INTRAVENOUS

## 2019-08-04 MED ORDER — SODIUM CHLORIDE 0.9 % IV SOLN
Freq: Once | INTRAVENOUS | Status: AC
  Administered 2019-08-04: 12:00:00 via INTRAVENOUS
  Filled 2019-08-04: qty 250

## 2019-08-04 MED ORDER — SODIUM CHLORIDE 0.9 % IV SOLN
2400.0000 mg/m2 | INTRAVENOUS | Status: DC
  Administered 2019-08-04: 3650 mg via INTRAVENOUS
  Filled 2019-08-04: qty 73

## 2019-08-04 MED ORDER — ATROPINE SULFATE 1 MG/ML IJ SOLN
0.5000 mg | Freq: Once | INTRAMUSCULAR | Status: AC | PRN
  Administered 2019-08-04: 0.5 mg via INTRAVENOUS

## 2019-08-04 MED ORDER — IRINOTECAN HCL CHEMO INJECTION 100 MG/5ML
180.0000 mg/m2 | Freq: Once | INTRAVENOUS | Status: AC
  Administered 2019-08-04: 13:00:00 280 mg via INTRAVENOUS
  Filled 2019-08-04: qty 14

## 2019-08-04 MED ORDER — SODIUM CHLORIDE 0.9% FLUSH
10.0000 mL | INTRAVENOUS | Status: DC | PRN
  Administered 2019-08-04: 15:00:00 10 mL
  Filled 2019-08-04: qty 10

## 2019-08-04 MED ORDER — PALONOSETRON HCL INJECTION 0.25 MG/5ML
INTRAVENOUS | Status: AC
  Filled 2019-08-04: qty 5

## 2019-08-04 MED FILL — XARELTO 20 MG TABLET: 20 | 30 days supply | Qty: 30 | Fill #0

## 2019-08-04 NOTE — Progress Notes (Signed)
[facility] OFFICE PROGRESS NOTE   Diagnosis: Colon cancer  INTERVAL HISTORY:   [name] completed cycle FOLFIRI on 07/22/2019.  No nausea, mouth sores, or diarrhea.  The back pain has resolved.  She is no longer taking pain medication.  No new complaint.  Objective:  Vital signs in last 24 hours:  Blood pressure 108/66, pulse 82, temperature 99.1 F (37.3 C), temperature source Oral, resp. rate 14, height '5\' 6"'$  (1.676 m), weight 104 lb 6.4 oz (47.4 kg), SpO2 100 %.    Lymphatics: Firm left inguinal nodes, largest measuring approximately 2 cm with a smaller node inferior to this Cardio: Regular rate and rhythm with an occasional pause GI: No hepatomegaly, nontender Vascular: No leg edema    Portacath/PICC-without erythema  Lab Results:  Lab Results  Component Value Date   WBC 3.3 (L) 08/04/2019   HGB 11.5 (L) 08/04/2019   HCT 34.9 (L) 08/04/2019   MCV 96.7 08/04/2019   PLT 156 08/04/2019   NEUTROABS 1.6 (L) 08/04/2019    CMP  Lab Results  Component Value Date   NA 138 07/22/2019   K 3.3 (L) 07/22/2019   CL 103 07/22/2019   CO2 25 07/22/2019   GLUCOSE 124 (H) 07/22/2019   BUN 16 07/22/2019   CREATININE 0.79 07/22/2019   CALCIUM 8.7 (L) 07/22/2019   PROT 6.8 07/22/2019   ALBUMIN 3.1 (L) 07/22/2019   AST 9 (L) 07/22/2019   ALT 9 07/22/2019   ALKPHOS 154 (H) 07/22/2019   BILITOT 0.3 07/22/2019   GFRNONAA >60 07/22/2019   GFRAA >60 07/22/2019    Lab Results  Component Value Date   CEA1 2.14 06/16/2019     Medications: I have reviewed the patient's current medications.   Assessment/Plan: 1. Metastatic colon cancer presenting with an Abdominal/pelvic mass  2. Colon cancerdiagnosed in [place] in 2016-pathology report dated 02/15/2015-invasive adenocarcinoma, well-differentiated with prominent mucinous features; microscopic tumor extension into mesenteric adipose tissue and focally involving visceral peritoneum; radial and mucosal  margins negative; lymph-vascular invasion withsuspicious foci identified;perineural invasion not identified; 16 negative lymph nodes; tumor deposits not identified; pT4apN0;MSI stable  ? CT 04/22/2017 revealed a heterogenous anterior pelvic wall mass, right inguinal mass, top normal sized retroperitoneal and iliac nodes, abutment of the anterior dome of the bladder and a loop of small bowel ? 04/29/2017 biopsy of anterior abdominal/pelvic wall mass-acellular mucin dissecting fibrous stroma ? Cycle 1 FOLFOX 05/15/2017 ? Cycle 2 FOLFOX 05/28/2017 ? Cycle 3 FOLFOX with Avastin on 06/11/2017 ? Cycle 4 FOLFOX/Avastin 06/25/2017 ? Cycle 5 FOLFOX/Avastin 07/10/2017, Neulasta added ? Restaging CT 07/21/2017-mild decrease in the size of a right inguinal node and the anterior abdominal wall mass ? Radiation to the abdominal wall mass 08/18/2017-09/03/2017 ? Status post excision of abdominal wall mass 10/03/2017--pathology showed adenocarcinoma with excessive extracellular mucin. Margins negative for tumor. Right inguinal wall mass also excised showing metastatic adenocarcinoma with excessive extracellular mucin in 1 of 8 lymph nodes. ? CT abdomen/pelvis 06/16/2019-new right hepatic lobe lesion measuring 2.7 x 1.7 cm. Hypoattenuation within the pancreatic uncinate process measuring 1.5 x 1.6 cm. Mild left adrenal thickening. Upper pole left renal mass 3.0 x 3.6 cm. Infiltrative hypoenhancement involving the lower pole left kidney. Nonocclusive thrombus within the infrarenal IVC. Necrotic adenopathy within the small bowel mesentery, new. New necrotic left inguinal nodes. Lytic lesion within and adjacent the anterior side of the L3 vertebral body. Contiguous mass within the left psoas measuring 4.6 x 3.4 cm. ? Cycle 1 FOLFIRI 06/23/2019 ?  Cycle 2 FOLFIRI 07/08/2019, Udenyca added  ? Cycle 3 FOLFIRI 07/22/2019   3. Ongoing tobacco and alcohol use  4. Mild neutropenia related to chemotherapy  07/10/2017. Neulasta added.  5.Admission January 2019 with an abdominal wall abscess and pubis osteomyelitis, status post debridement and antibiotics  6.Pain secondary to #1. 7.CT 06/16/2019 with nonocclusive thrombus infrarenal IVC. Anticoagulation initiated.     Disposition: Yolanda Watson has completed 3 cycles of FOLFIRI.  She is tolerating chemotherapy well.  Her pain has improved in the left inguinal lymph nodes appear smaller.  She will complete cycle 4 today.  She will receive the Udenyca with this cycle.  Yolanda Watson will return for an office visit and chemotherapy in 2 weeks.  Betsy Coder, MD  08/04/2019  11:05 AM

## 2019-08-04 NOTE — Progress Notes (Signed)
1358-pt complaining of stomach cramping. Irinotecan infusion stopped, NS opened to flush line. Atropine given (see MAR). Infusion restarted at 1402 after patient stated the cramping had gone away. Will continue to assess.

## 2019-08-04 NOTE — Patient Instructions (Signed)
[facility] Discharge Instructions for Patients Receiving Chemotherapy  Today you received the following chemotherapy agents: Irinotecan (Camptosar), Leucovorin, Fluorouracil (Adrucil, 5-FU)  To help prevent nausea and vomiting after your treatment, we encourage you to take your nausea medication as directed.    If you develop nausea and vomiting that is not controlled by your nausea medication, call the clinic.   BELOW ARE SYMPTOMS THAT SHOULD BE REPORTED IMMEDIATELY:  *FEVER GREATER THAN 100.5 F  *CHILLS WITH OR WITHOUT FEVER  NAUSEA AND VOMITING THAT IS NOT CONTROLLED WITH YOUR NAUSEA MEDICATION  *UNUSUAL SHORTNESS OF BREATH  *UNUSUAL BRUISING OR BLEEDING  TENDERNESS IN MOUTH AND THROAT WITH OR WITHOUT PRESENCE OF ULCERS  *URINARY PROBLEMS  *BOWEL PROBLEMS  UNUSUAL RASH Items with * indicate a potential emergency and should be followed up as soon as possible.  Feel free to call the clinic should you have any questions or concerns. The clinic phone number is [phone].  Please show the [facility] at check-in to the Emergency Department and triage nurse.  Coronavirus (COVID-19) Are you at risk?  Are you at risk for the Coronavirus (COVID-19)?  To be considered HIGH RISK for Coronavirus (COVID-19), you have to meet the following criteria:  . Traveled to [place], [place], [place], [place] or [place]; or in the [place] to [place], [place], [place], or [place]; and have fever, cough, and shortness of breath within the last 2 weeks of travel OR . Been in close contact with a person diagnosed with COVID-19 within the last 2 weeks and have fever, cough, and shortness of breath . IF YOU DO NOT MEET THESE CRITERIA, YOU ARE CONSIDERED LOW RISK FOR COVID-19.  What to do if you are HIGH RISK for COVID-19?  Marland Kitchen If you are having a medical emergency, call 911. . Seek medical care right away. Before you go to a doctor's office, urgent care or  emergency department, call ahead and tell them about your recent travel, contact with someone diagnosed with COVID-19, and your symptoms. You should receive instructions from your physician's office regarding next steps of care.  . When you arrive at healthcare provider, tell the healthcare staff immediately you have returned from visiting [place], [place], [place], [place] or [place]; or traveled in the [place] to [place], [place], [place], or [place]; in the last two weeks or you have been in close contact with a person diagnosed with COVID-19 in the last 2 weeks.   . Tell the health care staff about your symptoms: fever, cough and shortness of breath. . After you have been seen by a medical provider, you will be either: o Tested for (COVID-19) and discharged home on quarantine except to seek medical care if symptoms worsen, and asked to  - Stay home and avoid contact with others until you get your results (4-5 days)  - Avoid travel on public transportation if possible (such as bus, train, or airplane) or o Sent to the Emergency Department by EMS for evaluation, COVID-19 testing, and possible admission depending on your condition and test results.  What to do if you are LOW RISK for COVID-19?  Reduce your risk of any infection by using the same precautions used for avoiding the common cold or flu:  Marland Kitchen Wash your hands often with soap and warm water for at least 20 seconds.  If soap and water are not readily available, use an alcohol-based hand sanitizer with at least 60% alcohol.  Marland Kitchen  If coughing or sneezing, cover your mouth and nose by coughing or sneezing into the elbow areas of your shirt or coat, into a tissue or into your sleeve (not your hands). . Avoid shaking hands with others and consider head nods or verbal greetings only. . Avoid touching your eyes, nose, or mouth with unwashed hands.  . Avoid close contact with people who are sick. . Avoid places or events with large numbers  of people in one location, like concerts or sporting events. . Carefully consider travel plans you have or are making. . If you are planning any travel outside or inside the [place], visit the CDC's Travelers' Health webpage for the latest health notices. . If you have some symptoms but not all symptoms, continue to monitor at home and seek medical attention if your symptoms worsen. . If you are having a medical emergency, call 911.   [facility] / e-Visit: [email or web address]         [facility]: [facility]: [phone]                   [facility]: [phone]

## 2019-08-06 ENCOUNTER — Telehealth: Payer: Self-pay | Admitting: Oncology

## 2019-08-06 ENCOUNTER — Inpatient Hospital Stay: Payer: Medicare Other

## 2019-08-06 ENCOUNTER — Other Ambulatory Visit: Payer: Self-pay

## 2019-08-06 VITALS — BP 130/73 | HR 74 | Temp 99.2°F | Resp 18

## 2019-08-06 DIAGNOSIS — Z5111 Encounter for antineoplastic chemotherapy: Secondary | ICD-10-CM | POA: Diagnosis not present

## 2019-08-06 DIAGNOSIS — C786 Secondary malignant neoplasm of retroperitoneum and peritoneum: Secondary | ICD-10-CM | POA: Diagnosis not present

## 2019-08-06 DIAGNOSIS — C778 Secondary and unspecified malignant neoplasm of lymph nodes of multiple regions: Secondary | ICD-10-CM | POA: Diagnosis not present

## 2019-08-06 DIAGNOSIS — C189 Malignant neoplasm of colon, unspecified: Secondary | ICD-10-CM | POA: Diagnosis not present

## 2019-08-06 DIAGNOSIS — D701 Agranulocytosis secondary to cancer chemotherapy: Secondary | ICD-10-CM | POA: Diagnosis not present

## 2019-08-06 DIAGNOSIS — Z923 Personal history of irradiation: Secondary | ICD-10-CM | POA: Diagnosis not present

## 2019-08-06 MED ORDER — PEGFILGRASTIM-CBQV 6 MG/0.6ML ~~LOC~~ SOSY
6.0000 mg | PREFILLED_SYRINGE | Freq: Once | SUBCUTANEOUS | Status: AC
  Administered 2019-08-06: 6 mg via SUBCUTANEOUS

## 2019-08-06 MED ORDER — PEGFILGRASTIM-CBQV 6 MG/0.6ML ~~LOC~~ SOSY
PREFILLED_SYRINGE | SUBCUTANEOUS | Status: AC
  Filled 2019-08-06: qty 0.6

## 2019-08-06 MED ORDER — HEPARIN SOD (PORK) LOCK FLUSH 100 UNIT/ML IV SOLN
500.0000 [IU] | Freq: Once | INTRAVENOUS | Status: AC | PRN
  Administered 2019-08-06: 13:00:00 500 [IU]
  Filled 2019-08-06: qty 5

## 2019-08-06 MED ORDER — SODIUM CHLORIDE 0.9% FLUSH
10.0000 mL | INTRAVENOUS | Status: DC | PRN
  Administered 2019-08-06: 10 mL
  Filled 2019-08-06: qty 10

## 2019-08-06 NOTE — Telephone Encounter (Signed)
Called and spoke with patient. Confirmed dates and times of appts ° °

## 2019-08-07 ENCOUNTER — Ambulatory Visit: Payer: Medicare Other

## 2019-08-15 ENCOUNTER — Other Ambulatory Visit: Payer: Self-pay | Admitting: Oncology

## 2019-08-17 NOTE — Progress Notes (Signed)
[facility]   [phone] Fax:([phone]) [phone]   Clinic Follow up Note   Patient Care Team: Patient, No Pcp Per as PCP - General (General Practice) 08/18/2019  CHIEF COMPLAINT: Follow-up colon cancer  CURRENT THERAPY: FOLFIRI  INTERVAL HISTORY: Ms. Koenigsberg returns for follow-up and treatment as scheduled.  She completed cycle 4 FOLFIRI and Udenyca on 8/5. She is very fatigued, but able to function and complete ADLs. Spouse helps her. She reports her fatigue is moderate, does not recover much between chemo cycles. Appetite is good. Denies fever or chills. Bowels are lose at baseline. Denies significant nausea. Denies mucositis. Denies neuropathy. She denies abdominal pain today. Occasionally takes up to 2 tabs oxycodone PRN.   MEDICAL HISTORY:  Past Medical History:  Diagnosis Date   Acquired bilateral hand deformity    both hands with severe digit contracture. VERY limited ROM    Alcohol abuse    Colon cancer ([place]) 2016   metastatic to abdominal wall   Port-A-Cath in place since 04-2017   right chest     SURGICAL HISTORY: Past Surgical History:  Procedure Laterality Date   ABDOMINAL SURGERY  2016   ABDOMINOPLASTY N/A 10/03/2017   Procedure: ABDOMINAL WALL RECONSTRUCTION WITH STATTICE;  Surgeon: [name], MD;  Location: [facility];  Service: Plastics;  Laterality: N/A;   DEBRIDEMENT AND CLOSURE WOUND N/A 01/01/2018   Procedure: DEBRIDEMENT AND CLOSURE ABDOMINAL WOUND;  Surgeon: [name], MD;  Location: [facility];  Service: Plastics;  Laterality: N/A;   EXCISION OF ABDOMINAL WALL TUMOR N/A 10/03/2017   Procedure: EXCISION OF COLON CANCER TUMOR FROM ABDOMINAL;  Surgeon: Stark Klein, MD;  Location: [facility];  Service: General;  Laterality: N/A;  TAP BLOCK   INCISION AND DRAINAGE OF WOUND N/A 11/14/2017   Procedure: IRRIGATION AND DEBRIDEMENT OF ABDOMINAL WALL;  Surgeon: [name], MD;  Location: [facility];  Service: Plastics;  Laterality:  N/A;   IR FLUORO GUIDE PORT INSERTION RIGHT  05/09/2017   IR US GUIDE VASC ACCESS RIGHT  05/09/2017   port-a-cath      I have reviewed the social history and family history with the patient and they are unchanged from previous note.  ALLERGIES:  is allergic to penicillins and latex.  MEDICATIONS:  Current Outpatient Medications  Medication Sig Dispense Refill   naproxen sodium (ALEVE) 220 MG tablet Take 220 mg by mouth as needed.      prochlorperazine (COMPAZINE) 10 MG tablet Take 1 tablet (10 mg total) by mouth every 6 (six) hours as needed for nausea. 30 tablet 1   rivaroxaban (XARELTO) 20 MG TABS tablet Take 1 tablet (20 mg total) by mouth daily with supper. 30 tablet 1   oxyCODONE (ROXICODONE) 5 MG immediate release tablet Take 1-2 tablets (5-10 mg total) by mouth every 4 (four) hours as needed for severe pain. 60 tablet 0   No current facility-administered medications for this visit.    Facility-Administered Medications Ordered in Other Visits  Medication Dose Route Frequency Provider Last Rate Last Dose   0.9 %  sodium chloride infusion   Intravenous Once Betsy Coder B, MD       atropine injection 0.4 mg  0.4 mg Intravenous Once PRN Alla Feeling, NP       fluorouracil (ADRUCIL) 3,650 mg in sodium chloride 0.9 % 77 mL chemo infusion  2,400 mg/m2 (Treatment Plan Recorded) Intravenous 1 day or 1 dose Ladell Pier, MD       fluorouracil (ADRUCIL) chemo  injection 600 mg  400 mg/m2 (Treatment Plan Recorded) Intravenous Once Ladell Pier, MD       irinotecan (CAMPTOSAR) 280 mg in dextrose 5 % 500 mL chemo infusion  180 mg/m2 (Treatment Plan Recorded) Intravenous Once Ladell Pier, MD       leucovorin 612 mg in dextrose 5 % 250 mL infusion  400 mg/m2 (Treatment Plan Recorded) Intravenous Once Ladell Pier, MD        PHYSICAL EXAMINATION: ECOG PERFORMANCE STATUS: 2 - Symptomatic, <50% confined to bed  Vitals:   08/18/19 0909  BP: 109/64  Pulse: 79    Resp: 18  Temp: 98.7 F (37.1 C)  SpO2: 100%   Filed Weights   08/18/19 0909  Weight: 102 lb 12.8 oz (46.6 kg)    GENERAL:alert, no distress and comfortable SKIN: no rash  EYES: sclera clear OROPHARYNX: no thrush or ulcers LUNGS: clear to auscultation with normal breathing effort HEART: regular rate & rhythm, no lower extremity edema ABDOMEN:abdomen soft, non-tender. Normal BS  Musculoskeletal:no cyanosis of digits  NEURO: alert & oriented x 3 with fluent speech, normal gait PAC without erythema Limited exam for covid19 outbreak  LABORATORY DATA:  I have reviewed the data as listed CBC Latest Ref Rng & Units 08/18/2019 08/04/2019 07/22/2019  WBC 4.0 - 10.5 K/uL 19.2(H) 3.3(L) 17.6(H)  Hemoglobin 12.0 - 15.0 g/dL 11.4(L) 11.5(L) 12.1  Hematocrit 36.0 - 46.0 % 34.9(L) 34.9(L) 36.1  Platelets 150 - 400 K/uL 139(L) 156 148(L)     CMP Latest Ref Rng & Units 08/18/2019 08/04/2019 07/22/2019  Glucose 70 - 99 mg/dL 109(H) 94 124(H)  BUN 8 - 23 mg/dL '18 14 16  '$ Creatinine 0.44 - 1.00 mg/dL 0.95 0.84 0.79  Sodium 135 - 145 mmol/L 140 140 138  Potassium 3.5 - 5.1 mmol/L 3.7 3.5 3.3(L)  Chloride 98 - 111 mmol/L 110 107 103  CO2 22 - 32 mmol/L 20(L) 22 25  Calcium 8.9 - 10.3 mg/dL 8.2(L) 8.7(L) 8.7(L)  Total Protein 6.5 - 8.1 g/dL 6.4(L) 6.7 6.8  Total Bilirubin 0.3 - 1.2 mg/dL 0.2(L) 0.5 0.3  Alkaline Phos 38 - 126 U/L 160(H) 136(H) 154(H)  AST 15 - 41 U/L 12(L) 16 9(L)  ALT 0 - 44 U/L '11 19 9      '$ RADIOGRAPHIC STUDIES: I have personally reviewed the radiological images as listed and agreed with the findings in the report. No results found.   ASSESSMENT & PLAN:   1. Metastatic colon cancer presenting with an Abdominal/pelvic mass  2. [place] in [place]-pathology report dated 02/15/2015-invasive adenocarcinoma, well-differentiated with prominent mucinous features; microscopic tumor extension into mesenteric adipose tissue and focally involving visceral  peritoneum; radial and mucosal margins negative; lymph-vascular invasion withsuspicious foci identified;perineural invasion not identified; 16 negative lymph nodes; tumor deposits not identified; [id number];MSI stable  ? CT 04/22/2017 revealed a heterogenous anterior pelvic wall mass, right inguinal mass, top normal sized retroperitoneal and iliac nodes, abutment of the anterior dome of the bladder and a loop of small bowel ? 04/29/2017 biopsy of anterior abdominal/pelvic wall mass-acellular mucin dissecting fibrous stroma ? Cycle 1 FOLFOX 05/15/2017 ? Cycle 2 FOLFOX 05/28/2017 ? Cycle 3 FOLFOX with Avastin on 06/11/2017 ? Cycle 4 FOLFOX/Avastin 06/25/2017 ? Cycle 5 FOLFOX/Avastin 07/10/2017, Neulasta added ? Restaging CT 07/21/2017-mild decrease in the size of a right inguinal node and the anterior abdominal wall mass ? Radiation to the abdominal wall mass 08/18/2017-09/03/2017 ? Status post excision of abdominal wall mass 10/03/2017--pathology showed  adenocarcinoma with excessive extracellular mucin. Margins negative for tumor. Right inguinal wall mass also excised showing metastatic adenocarcinoma with excessive extracellular mucin in 1 of 8 lymph nodes. ? CT abdomen/pelvis 06/16/2019-new right hepatic lobe lesion measuring 2.7 x 1.7 cm. Hypoattenuation within the pancreatic uncinate process measuring 1.5 x 1.6 cm. Mild left adrenal thickening. Upper pole left renal mass 3.0 x 3.6 cm. Infiltrative hypoenhancement involving the lower pole left kidney. Nonocclusive thrombus within the infrarenal IVC. Necrotic adenopathy within the small bowel mesentery, new. New necrotic left inguinal nodes. Lytic lesion within and adjacent the anterior side of the L3 vertebral body. Contiguous mass within the left psoas measuring 4.6 x 3.4 cm. ? Cycle 1 FOLFIRI 06/23/2019 ? Cycle 2 FOLFIRI 07/08/2019, Udenyca added  ? Cycle 3 FOLFIRI 07/22/2019 ? Cycle 4 FOLFIRI 08/04/2019, Udenyca added ? Cycle 5 FOLFIRI  08/18/2019   3. Ongoing tobacco and alcohol use  4. Mild neutropenia related to chemotherapy 07/10/2017. Neulasta added.  5.Admission January 2019 with an abdominal wall abscess and pubis osteomyelitis, status post debridement and antibiotics  6.Pain secondary to #1. 7.CT 06/16/2019 with nonocclusive thrombus infrarenal IVC. Anticoagulation initiated.  Disposition: Ms. Broski appears stable. She completed 4 cycles of FOLFIRI. She tolerates treatment well except moderate fatigue. She remains functional. I encouraged her to try to increase activity. Labs reviewed. She will proceed with cycle 5 FOLFIRI today. Hold Udenyca for elevated WBC. She will undergo restaging CT after this cycle. She is scheduled for f/u and treatment in 2 weeks. She does not want me to schedule further appointments at this time. I reviewed the plan with Dr. Benay Spice.   Orders Placed This Encounter  Procedures   CT Abdomen Pelvis W Contrast    Standing Status:   Future    Standing Expiration Date:   08/17/2020    Order Specific Question:   If indicated for the ordered procedure, I authorize the administration of contrast media per Radiology protocol    Answer:   Yes    Order Specific Question:   Preferred imaging location?    Answer:   [facility]    Order Specific Question:   Is Oral Contrast requested for this exam?    Answer:   Yes, Per Radiology protocol    Order Specific Question:   Radiology Contrast Protocol - do NOT remove file path    Answer:   [email or web address]   All questions were answered. The patient knows to call the clinic with any problems, questions or concerns. No barriers to learning was detected.     Alla Feeling, NP 08/18/19

## 2019-08-18 ENCOUNTER — Inpatient Hospital Stay (HOSPITAL_BASED_OUTPATIENT_CLINIC_OR_DEPARTMENT_OTHER): Payer: Medicare Other | Admitting: Nurse Practitioner

## 2019-08-18 ENCOUNTER — Inpatient Hospital Stay: Payer: Medicare Other

## 2019-08-18 ENCOUNTER — Other Ambulatory Visit: Payer: Self-pay

## 2019-08-18 ENCOUNTER — Encounter: Payer: Self-pay | Admitting: Nurse Practitioner

## 2019-08-18 VITALS — BP 109/64 | HR 79 | Temp 98.7°F | Resp 18 | Ht 66.0 in | Wt 102.8 lb

## 2019-08-18 DIAGNOSIS — C189 Malignant neoplasm of colon, unspecified: Secondary | ICD-10-CM | POA: Diagnosis not present

## 2019-08-18 DIAGNOSIS — Z923 Personal history of irradiation: Secondary | ICD-10-CM | POA: Diagnosis not present

## 2019-08-18 DIAGNOSIS — D490 Neoplasm of unspecified behavior of digestive system: Secondary | ICD-10-CM

## 2019-08-18 DIAGNOSIS — C787 Secondary malignant neoplasm of liver and intrahepatic bile duct: Secondary | ICD-10-CM

## 2019-08-18 DIAGNOSIS — D701 Agranulocytosis secondary to cancer chemotherapy: Secondary | ICD-10-CM | POA: Diagnosis not present

## 2019-08-18 DIAGNOSIS — Z95828 Presence of other vascular implants and grafts: Secondary | ICD-10-CM

## 2019-08-18 DIAGNOSIS — C778 Secondary and unspecified malignant neoplasm of lymph nodes of multiple regions: Secondary | ICD-10-CM | POA: Diagnosis not present

## 2019-08-18 DIAGNOSIS — Z5111 Encounter for antineoplastic chemotherapy: Secondary | ICD-10-CM | POA: Diagnosis not present

## 2019-08-18 DIAGNOSIS — C786 Secondary malignant neoplasm of retroperitoneum and peritoneum: Secondary | ICD-10-CM | POA: Diagnosis not present

## 2019-08-18 LAB — CMP (CANCER CENTER ONLY)
ALT: 11 U/L (ref 0–44)
AST: 12 U/L — ABNORMAL LOW (ref 15–41)
Albumin: 3.4 g/dL — ABNORMAL LOW (ref 3.5–5.0)
Alkaline Phosphatase: 160 U/L — ABNORMAL HIGH (ref 38–126)
Anion gap: 10 (ref 5–15)
BUN: 18 mg/dL (ref 8–23)
CO2: 20 mmol/L — ABNORMAL LOW (ref 22–32)
Calcium: 8.2 mg/dL — ABNORMAL LOW (ref 8.9–10.3)
Chloride: 110 mmol/L (ref 98–111)
Creatinine: 0.95 mg/dL (ref 0.44–1.00)
GFR, Est AFR Am: >60 mL/min (ref >60)
GFR, Estimated: >60 mL/min (ref >60)
Glucose, Bld: 109 mg/dL — ABNORMAL HIGH (ref 70–99)
Potassium: 3.7 mmol/L (ref 3.5–5.1)
Sodium: 140 mmol/L (ref 135–145)
Total Bilirubin: 0.2 mg/dL — ABNORMAL LOW (ref 0.3–1.2)
Total Protein: 6.4 g/dL — ABNORMAL LOW (ref 6.5–8.1)

## 2019-08-18 LAB — CBC WITH DIFFERENTIAL (CANCER CENTER ONLY)
Abs Immature Granulocytes: 0.23 10*3/uL — ABNORMAL HIGH (ref 0.00–0.07)
Basophils Absolute: 0.1 10*3/uL (ref 0.0–0.1)
Basophils Relative: 1 %
Eosinophils Absolute: 0.2 10*3/uL (ref 0.0–0.5)
Eosinophils Relative: 1 %
HCT: 34.9 % — ABNORMAL LOW (ref 36.0–46.0)
Hemoglobin: 11.4 g/dL — ABNORMAL LOW (ref 12.0–15.0)
Immature Granulocytes: 1 %
Lymphocytes Relative: 9 %
Lymphs Abs: 1.7 10*3/uL (ref 0.7–4.0)
MCH: 31.6 pg (ref 26.0–34.0)
MCHC: 32.7 g/dL (ref 30.0–36.0)
MCV: 96.7 fL (ref 80.0–100.0)
Monocytes Absolute: 0.9 10*3/uL (ref 0.1–1.0)
Monocytes Relative: 5 %
Neutro Abs: 16 10*3/uL — ABNORMAL HIGH (ref 1.7–7.7)
Neutrophils Relative %: 83 %
Platelet Count: 139 10*3/uL — ABNORMAL LOW (ref 150–400)
RBC: 3.61 MIL/uL — ABNORMAL LOW (ref 3.87–5.11)
RDW: 16.6 % — ABNORMAL HIGH (ref 11.5–15.5)
WBC Count: 19.2 10*3/uL — ABNORMAL HIGH (ref 4.0–10.5)
nRBC: 0 % (ref 0.0–0.2)

## 2019-08-18 MED ORDER — PALONOSETRON HCL INJECTION 0.25 MG/5ML
0.2500 mg | Freq: Once | INTRAVENOUS | Status: AC
  Administered 2019-08-18: 0.25 mg via INTRAVENOUS

## 2019-08-18 MED ORDER — SODIUM CHLORIDE 0.9 % IV SOLN
2400.0000 mg/m2 | INTRAVENOUS | Status: DC
  Administered 2019-08-18: 3650 mg via INTRAVENOUS
  Filled 2019-08-18: qty 73

## 2019-08-18 MED ORDER — FLUOROURACIL CHEMO INJECTION 2.5 GM/50ML
400.0000 mg/m2 | Freq: Once | INTRAVENOUS | Status: AC
  Administered 2019-08-18: 13:00:00 600 mg via INTRAVENOUS
  Filled 2019-08-18: qty 12

## 2019-08-18 MED ORDER — PALONOSETRON HCL INJECTION 0.25 MG/5ML
INTRAVENOUS | Status: AC
  Filled 2019-08-18: qty 5

## 2019-08-18 MED ORDER — ATROPINE SULFATE 1 MG/ML IJ SOLN: 0.5000 mg | Freq: Once | INTRAMUSCULAR | Status: DC | PRN

## 2019-08-18 MED ORDER — SODIUM CHLORIDE 0.9 % IV SOLN
Freq: Once | INTRAVENOUS | Status: AC
  Administered 2019-08-18: 10:00:00 via INTRAVENOUS
  Filled 2019-08-18: qty 250

## 2019-08-18 MED ORDER — SODIUM CHLORIDE 0.9% FLUSH
10.0000 mL | INTRAVENOUS | Status: DC | PRN
  Administered 2019-08-18: 09:00:00 10 mL via INTRAVENOUS
  Filled 2019-08-18: qty 10

## 2019-08-18 MED ORDER — LEUCOVORIN CALCIUM INJECTION 350 MG
400.0000 mg/m2 | Freq: Once | INTRAVENOUS | Status: AC
  Administered 2019-08-18: 612 mg via INTRAVENOUS
  Filled 2019-08-18: qty 30.6

## 2019-08-18 MED ORDER — SODIUM CHLORIDE 0.9 % IV SOLN
Freq: Once | INTRAVENOUS | Status: AC
  Administered 2019-08-18: 12:00:00 via INTRAVENOUS
  Filled 2019-08-18: qty 250

## 2019-08-18 MED ORDER — ATROPINE SULFATE 0.4 MG/ML IJ SOLN
0.4000 mg | Freq: Once | INTRAMUSCULAR | Status: AC | PRN
  Administered 2019-08-18: 0.4 mg via INTRAVENOUS

## 2019-08-18 MED ORDER — IRINOTECAN HCL CHEMO INJECTION 100 MG/5ML
180.0000 mg/m2 | Freq: Once | INTRAVENOUS | Status: AC
  Administered 2019-08-18: 280 mg via INTRAVENOUS
  Filled 2019-08-18: qty 14

## 2019-08-18 MED ORDER — ATROPINE SULFATE 0.4 MG/ML IJ SOLN
INTRAMUSCULAR | Status: AC
  Filled 2019-08-18: qty 1

## 2019-08-18 MED ORDER — ATROPINE SULFATE 1 MG/ML IJ SOLN
INTRAMUSCULAR | Status: AC
  Filled 2019-08-18: qty 1

## 2019-08-18 MED ORDER — DEXAMETHASONE SODIUM PHOSPHATE 10 MG/ML IJ SOLN
10.0000 mg | Freq: Once | INTRAMUSCULAR | Status: AC
  Administered 2019-08-18: 11:00:00 10 mg via INTRAVENOUS

## 2019-08-18 MED ORDER — DEXAMETHASONE SODIUM PHOSPHATE 10 MG/ML IJ SOLN
INTRAMUSCULAR | Status: AC
  Filled 2019-08-18: qty 1

## 2019-08-18 NOTE — Patient Instructions (Signed)
Coronavirus (COVID-19) Are you at risk?  Are you at risk for the Coronavirus (COVID-19)?  To be considered HIGH RISK for Coronavirus (COVID-19), you have to meet the following criteria:  . Traveled to [place], [place], [place], [place] or [place]; or in the [place] to [place], [place], [place], or [place]; and have fever, cough, and shortness of breath within the last 2 weeks of travel OR . Been in close contact with a person diagnosed with COVID-19 within the last 2 weeks and have fever, cough, and shortness of breath . IF YOU DO NOT MEET THESE CRITERIA, YOU ARE CONSIDERED LOW RISK FOR COVID-19.  What to do if you are HIGH RISK for COVID-19?  . If you are having a medical emergency, call 911. . Seek medical care right away. Before you go to a doctor's office, urgent care or emergency department, call ahead and tell them about your recent travel, contact with someone diagnosed with COVID-19, and your symptoms. You should receive instructions from your physician's office regarding next steps of care.  . When you arrive at healthcare provider, tell the healthcare staff immediately you have returned from visiting [place], [place], [place], [place] or [place]; or traveled in the [place] to [place], [place], [place], or [place]; in the last two weeks or you have been in close contact with a person diagnosed with COVID-19 in the last 2 weeks.   . Tell the health care staff about your symptoms: fever, cough and shortness of breath. . After you have been seen by a medical provider, you will be either: o Tested for (COVID-19) and discharged home on quarantine except to seek medical care if symptoms worsen, and asked to  - Stay home and avoid contact with others until you get your results (4-5 days)  - Avoid travel on public transportation if possible (such as bus, train, or airplane) or o Sent to the Emergency Department by EMS for evaluation, COVID-19 testing, and possible  admission depending on your condition and test results.  What to do if you are LOW RISK for COVID-19?  Reduce your risk of any infection by using the same precautions used for avoiding the common cold or flu:  . Wash your hands often with soap and warm water for at least 20 seconds.  If soap and water are not readily available, use an alcohol-based hand sanitizer with at least 60% alcohol.  . If coughing or sneezing, cover your mouth and nose by coughing or sneezing into the elbow areas of your shirt or coat, into a tissue or into your sleeve (not your hands). . Avoid shaking hands with others and consider head nods or verbal greetings only. . Avoid touching your eyes, nose, or mouth with unwashed hands.  . Avoid close contact with people who are sick. . Avoid places or events with large numbers of people in one location, like concerts or sporting events. . Carefully consider travel plans you have or are making. . If you are planning any travel outside or inside the [place], visit the CDC's Travelers' Health webpage for the latest health notices. . If you have some symptoms but not all symptoms, continue to monitor at home and seek medical attention if your symptoms worsen. . If you are having a medical emergency, call 911.   ADDITIONAL HEALTHCARE OPTIONS FOR PATIENTS  [facility] / e-Visit: [email or web address]         [facility]: [phone]  Onset   Urgent Care: [facility]: [facility] [place] Discharge Instructions for Patients Receiving Chemotherapy  Today you received the following chemotherapy agents Irinotecan, Leucovorin and Adrucil   To help prevent nausea and vomiting after your treatment, we encourage you to take your nausea medication as directed.    If you develop nausea and vomiting that is not controlled by your nausea medication, call  the clinic.   BELOW ARE SYMPTOMS THAT SHOULD BE REPORTED IMMEDIATELY:  *FEVER GREATER THAN 100.5 F  *CHILLS WITH OR WITHOUT FEVER  NAUSEA AND VOMITING THAT IS NOT CONTROLLED WITH YOUR NAUSEA MEDICATION  *UNUSUAL SHORTNESS OF BREATH  *UNUSUAL BRUISING OR BLEEDING  TENDERNESS IN MOUTH AND THROAT WITH OR WITHOUT PRESENCE OF ULCERS  *URINARY PROBLEMS  *BOWEL PROBLEMS  UNUSUAL RASH Items with * indicate a potential emergency and should be followed up as soon as possible.  Feel free to call the clinic should you have any questions or concerns. The clinic phone number is [phone].  Please show the [facility] at check-in to the Emergency Department and triage nurse.

## 2019-08-19 ENCOUNTER — Telehealth: Payer: Self-pay | Admitting: Nurse Practitioner

## 2019-08-19 NOTE — Telephone Encounter (Signed)
No los per 8/19. °

## 2019-08-20 ENCOUNTER — Emergency Department (HOSPITAL_COMMUNITY)
Admission: EM | Admit: 2019-08-20 | Discharge: 2019-08-20 | Disposition: A | Discharge status: Home / Self Care | Payer: Medicare Other | Attending: Emergency Medicine | Admitting: Emergency Medicine

## 2019-08-20 ENCOUNTER — Inpatient Hospital Stay: Payer: Medicare Other

## 2019-08-20 ENCOUNTER — Emergency Department (HOSPITAL_COMMUNITY): Payer: Medicare Other

## 2019-08-20 ENCOUNTER — Other Ambulatory Visit: Payer: Self-pay

## 2019-08-20 ENCOUNTER — Inpatient Hospital Stay (HOSPITAL_BASED_OUTPATIENT_CLINIC_OR_DEPARTMENT_OTHER): Payer: Medicare Other | Admitting: Medical

## 2019-08-20 DIAGNOSIS — W010XXA Fall on same level from slipping, tripping and stumbling without subsequent striking against object, initial encounter: Secondary | ICD-10-CM | POA: Insufficient documentation

## 2019-08-20 DIAGNOSIS — M79642 Pain in left hand: Secondary | ICD-10-CM | POA: Diagnosis not present

## 2019-08-20 DIAGNOSIS — S0993XA Unspecified injury of face, initial encounter: Secondary | ICD-10-CM | POA: Diagnosis not present

## 2019-08-20 DIAGNOSIS — T1490XA Injury, unspecified, initial encounter: Secondary | ICD-10-CM | POA: Diagnosis not present

## 2019-08-20 DIAGNOSIS — C787 Secondary malignant neoplasm of liver and intrahepatic bile duct: Secondary | ICD-10-CM

## 2019-08-20 DIAGNOSIS — W19XXXA Unspecified fall, initial encounter: Secondary | ICD-10-CM

## 2019-08-20 DIAGNOSIS — F17210 Nicotine dependence, cigarettes, uncomplicated: Secondary | ICD-10-CM | POA: Insufficient documentation

## 2019-08-20 DIAGNOSIS — C189 Malignant neoplasm of colon, unspecified: Secondary | ICD-10-CM

## 2019-08-20 DIAGNOSIS — Z79899 Other long term (current) drug therapy: Secondary | ICD-10-CM | POA: Insufficient documentation

## 2019-08-20 DIAGNOSIS — Z23 Encounter for immunization: Secondary | ICD-10-CM | POA: Diagnosis not present

## 2019-08-20 DIAGNOSIS — Y9301 Activity, walking, marching and hiking: Secondary | ICD-10-CM | POA: Diagnosis not present

## 2019-08-20 DIAGNOSIS — S6992XA Unspecified injury of left wrist, hand and finger(s), initial encounter: Secondary | ICD-10-CM | POA: Diagnosis not present

## 2019-08-20 DIAGNOSIS — S60512A Abrasion of left hand, initial encounter: Secondary | ICD-10-CM | POA: Diagnosis not present

## 2019-08-20 DIAGNOSIS — Y999 Unspecified external cause status: Secondary | ICD-10-CM | POA: Insufficient documentation

## 2019-08-20 DIAGNOSIS — S0990XA Unspecified injury of head, initial encounter: Secondary | ICD-10-CM | POA: Diagnosis not present

## 2019-08-20 DIAGNOSIS — S6991XA Unspecified injury of right wrist, hand and finger(s), initial encounter: Secondary | ICD-10-CM | POA: Diagnosis not present

## 2019-08-20 DIAGNOSIS — S01511A Laceration without foreign body of lip, initial encounter: Secondary | ICD-10-CM | POA: Diagnosis not present

## 2019-08-20 DIAGNOSIS — Z7901 Long term (current) use of anticoagulants: Secondary | ICD-10-CM | POA: Diagnosis not present

## 2019-08-20 DIAGNOSIS — Y92531 Health care provider office as the place of occurrence of the external cause: Secondary | ICD-10-CM | POA: Diagnosis not present

## 2019-08-20 DIAGNOSIS — S299XXA Unspecified injury of thorax, initial encounter: Secondary | ICD-10-CM | POA: Diagnosis not present

## 2019-08-20 DIAGNOSIS — S82042A Displaced comminuted fracture of left patella, initial encounter for closed fracture: Secondary | ICD-10-CM | POA: Diagnosis not present

## 2019-08-20 DIAGNOSIS — S50312A Abrasion of left elbow, initial encounter: Secondary | ICD-10-CM | POA: Diagnosis not present

## 2019-08-20 DIAGNOSIS — M79641 Pain in right hand: Secondary | ICD-10-CM | POA: Diagnosis not present

## 2019-08-20 DIAGNOSIS — Z91040 Latex allergy status: Secondary | ICD-10-CM | POA: Insufficient documentation

## 2019-08-20 DIAGNOSIS — S82045A Nondisplaced comminuted fracture of left patella, initial encounter for closed fracture: Secondary | ICD-10-CM | POA: Diagnosis not present

## 2019-08-20 MED ORDER — PROCHLORPERAZINE MALEATE 10 MG PO TABS
ORAL_TABLET | ORAL | Status: AC
  Filled 2019-08-20: qty 1

## 2019-08-20 MED ORDER — HEPARIN SOD (PORK) LOCK FLUSH 100 UNIT/ML IV SOLN
500.0000 [IU] | Freq: Once | INTRAVENOUS | Status: AC
  Administered 2019-08-20: 500 [IU]
  Filled 2019-08-20: qty 5

## 2019-08-20 MED ORDER — MORPHINE SULFATE (PF) 4 MG/ML IV SOLN
4.0000 mg | Freq: Once | INTRAVENOUS | Status: AC
  Administered 2019-08-20: 4 mg via INTRAVENOUS
  Filled 2019-08-20: qty 1

## 2019-08-20 MED ORDER — TETANUS-DIPHTH-ACELL PERTUSSIS 5-2.5-18.5 LF-MCG/0.5 IM SUSP
0.5000 mL | Freq: Once | INTRAMUSCULAR | Status: AC
  Administered 2019-08-20: 14:00:00 0.5 mL via INTRAMUSCULAR
  Filled 2019-08-20: qty 0.5

## 2019-08-20 MED ORDER — LIDOCAINE HCL (PF) 1 % IJ SOLN
10.0000 mL | Freq: Once | INTRAMUSCULAR | Status: AC
  Administered 2019-08-20: 10 mL
  Filled 2019-08-20: qty 30

## 2019-08-20 MED ORDER — OXYCODONE HCL 5 MG PO TABS: 5.0000 mg | ORAL_TABLET | Freq: Four times a day (QID) | ORAL | 0 refills | Status: DC | PRN

## 2019-08-20 NOTE — ED Provider Notes (Signed)
[facility] DEPT Provider Note   CSN: [id number] Arrival date & time: 08/20/19  1213     History   Chief Complaint Chief Complaint  Patient presents with   Fall   Lip Laceration    HPI Yolanda Watson is a 68 y.o. female with history of colon cancer presents with a mechanical fall after chemo today.  Patient reports her left toe caught on the floor and she went face first.  She did not lose consciousness.  She cut her lip with her teeth and hit her chin.  She caught herself with her hands.  She has pain both hands and left knee as well as her chest where she fell.  She has abrasions and skin tears to the left elbow and left hand.  She is unsure of her tetanus status.  She denies any shortness of breath, new abdominal pain, nausea, vomiting.     HPI  Past Medical History:  Diagnosis Date   Acquired bilateral hand deformity    both hands with severe digit contracture. VERY limited ROM    Alcohol abuse    Colon cancer ([place]) 2016   metastatic to abdominal wall   Port-A-Cath in place since 04-2017   right chest     Patient Active Problem List   Diagnosis Date Noted   Metastatic colon cancer to liver ([place]) 06/17/2019   Goals of care, counseling/discussion 06/17/2019   At risk for adverse drug event 01/13/2018   Anemia, unspecified 01/11/2018   Abdominal pain, lower    Encounter for palliative care    Abdominal wall abscess at site of surgical wound    Acute osteomyelitis of pelvic region [facility])    Open wound of abdomen 11/14/2017   Port-A-Cath in place 10/21/2017   Local recurrence of colon cancer ([place]) 10/03/2017   Secondary malignancy of inguinal lymph nodes ([place]) 07/29/2017   Cancer of right colon ([place]) 05/05/2017    Past Surgical History:  Procedure Laterality Date   ABDOMINAL SURGERY  2016   ABDOMINOPLASTY N/A 10/03/2017   Procedure: ABDOMINAL WALL RECONSTRUCTION WITH STATTICE;  Surgeon: [name], MD;   Location: [facility];  Service: Plastics;  Laterality: N/A;   DEBRIDEMENT AND CLOSURE WOUND N/A 01/01/2018   Procedure: DEBRIDEMENT AND CLOSURE ABDOMINAL WOUND;  Surgeon: [name], MD;  Location: [facility];  Service: Plastics;  Laterality: N/A;   EXCISION OF ABDOMINAL WALL TUMOR N/A 10/03/2017   Procedure: EXCISION OF COLON CANCER TUMOR FROM ABDOMINAL;  Surgeon: Stark Klein, MD;  Location: [facility];  Service: General;  Laterality: N/A;  TAP BLOCK   INCISION AND DRAINAGE OF WOUND N/A 11/14/2017   Procedure: IRRIGATION AND DEBRIDEMENT OF ABDOMINAL WALL;  Surgeon: [name], MD;  Location: [place];  Service: Plastics;  Laterality: N/A;   IR FLUORO GUIDE PORT INSERTION RIGHT  05/09/2017   IR US GUIDE VASC ACCESS RIGHT  05/09/2017   port-a-cath       OB History   No obstetric history on file.      Home Medications    Prior to Admission medications   Medication Sig Start Date End Date Taking? Authorizing Provider  naproxen sodium (ALEVE) 220 MG tablet Take 220 mg by mouth as needed.     [provider]  oxyCODONE (ROXICODONE) 5 MG immediate release tablet Take 1-2 tablets (5-10 mg total) by mouth every 6 (six) hours as needed for severe pain. 08/20/19   Keyna Blizard, Bea Graff, PA-C  prochlorperazine (COMPAZINE) 10 MG tablet Take  1 tablet (10 mg total) by mouth every 6 (six) hours as needed for nausea. 06/23/19   Ladell Pier, MD  rivaroxaban (XARELTO) 20 MG TABS tablet Take 1 tablet (20 mg total) by mouth daily with supper. 08/04/19   Ladell Pier, MD    Family History Family History  Problem Relation Age of Onset   Heart disease Mother    Stroke Maternal Aunt    Cancer Neg Hx    Diabetes Neg Hx     Social History Social History   Tobacco Use   Smoking status: Current Every Day Smoker    Packs/day: 1.00    Years: 45.00    Pack years: 45.00    Types: Cigarettes   Smokeless tobacco: Never Used   Tobacco comment: She has not smoked since 12/29/17  Substance  Use Topics   Alcohol use: Yes    Alcohol/week: 2.0 - 4.0 standard drinks    Types: 2 - 4 Shots of liquor per week    Comment: denies problem with ETOH but contineus to drink "a couple of drinks per day"   Drug use: No     Allergies   Penicillins and Latex   Review of Systems Review of Systems  Constitutional: Negative for chills and fever.  HENT: Positive for facial swelling. Negative for sore throat.   Respiratory: Negative for shortness of breath.   Cardiovascular: Negative for chest pain.  Gastrointestinal: Negative for abdominal pain, nausea and vomiting.  Genitourinary: Negative for dysuria.  Musculoskeletal: Positive for arthralgias and joint swelling. Negative for back pain and neck pain.  Skin: Positive for wound. Negative for rash.  Neurological: Negative for syncope and headaches.  Psychiatric/Behavioral: The patient is not nervous/anxious.      Physical Exam Updated Vital Signs BP (!) 144/80    Pulse 82    Temp 98 F (36.7 C) (Oral)    Resp 17    Ht 5\' 6"  (1.676 m)    Wt 46.6 kg    SpO2 97%    BMI 16.58 kg/m   Physical Exam Vitals signs and nursing note reviewed.  Constitutional:      General: She is not in acute distress.    Appearance: She is well-developed.  HENT:     Head: Normocephalic and atraumatic.     Mouth/Throat:     Pharynx: No oropharyngeal exudate.     Comments: 1.5 centimeter laceration through the upper lip, through and through Tenderness, edema, and ecchymosis to the chin Eyes:     General: No scleral icterus.       Right eye: No discharge.        Left eye: No discharge.     Conjunctiva/sclera: Conjunctivae normal.     Pupils: Pupils are equal, round, and reactive to light.  Neck:     Musculoskeletal: Normal range of motion and neck supple.     Thyroid: No thyromegaly.  Cardiovascular:     Rate and Rhythm: Normal rate and regular rhythm.     Heart sounds: Normal heart sounds. No murmur. No friction rub. No gallop.   Pulmonary:      Effort: Pulmonary effort is normal. No respiratory distress.     Breath sounds: Normal breath sounds. No stridor. No wheezing or rales.  Chest:       Comments: No ecchymosis or crepitus over the chest Abdominal:     General: Bowel sounds are normal. There is no distension.     Palpations: Abdomen is soft.  Tenderness: There is no abdominal tenderness. There is no guarding or rebound.  Musculoskeletal:     Comments: Left knee tenderness over the patella; joint effusion noted; limited range of motion secondary to pain Ecchymosis to bilateral hands with tenderness; mild skin tears to left hand Mild skin tear to left elbow without bony tenderness  Lymphadenopathy:     Cervical: No cervical adenopathy.  Skin:    General: Skin is warm and dry.     Coloration: Skin is not pale.     Findings: No rash.  Neurological:     Mental Status: She is alert.     Coordination: Coordination normal.     Comments: Equal bilateral grip strength, 5/5 strength all 4 extremities except left leg unable to move significantly without pain due to knee pain      ED Treatments / Results  Labs (all labs ordered are listed, but only abnormal results are displayed) Labs Reviewed - No data to display  EKG None  Radiology Dg Chest 2 View  Result Date: 08/20/2019 CLINICAL DATA:  Fall from standing. EXAM: CHEST - 2 VIEW COMPARISON:  CT abdomen and pelvis dated 06/17 2020. FINDINGS: The heart size and mediastinal contours are within normal limits. Scarring is seen in the right lung base. No pleural effusion or pneumothorax is identified. The left lung is clear. The visualized skeletal structures are unremarkable. A right internal jugular central venous port catheter tip overlies the superior cavoatrial junction. IMPRESSION: No active cardiopulmonary disease. Electronically Signed   By: Zerita Boers M.D.   On: 08/20/2019 14:16   Ct Head Wo Contrast  Result Date: 08/20/2019 CLINICAL DATA:  Fall, anticoagulated  EXAM: CT HEAD WITHOUT CONTRAST CT MAXILLOFACIAL WITHOUT CONTRAST TECHNIQUE: Multidetector CT imaging of the head and maxillofacial structures were performed using the standard protocol without intravenous contrast. Multiplanar CT image reconstructions of the maxillofacial structures were also generated. COMPARISON:  None. FINDINGS: CT HEAD FINDINGS Brain: No evidence of acute infarction, hemorrhage, hydrocephalus, extra-axial collection or mass lesion/mass effect. Periventricular white matter hypodensity. Vascular: No hyperdense vessel or unexpected calcification. Skull: Normal. Negative for fracture or focal lesion. Other: None. CT MAXILLOFACIAL FINDINGS Osseous: No fracture or mandibular dislocation. No destructive process. Orbits: Negative. No traumatic or inflammatory finding. Sinuses: There is a probable mucoid retention cyst or polyp near the left ostiomeatal complex (series 14, image 30) with a small fluid level in the left maxillary sinus. Soft tissues: Negative. IMPRESSION: 1. No acute intracranial pathology. Small-vessel white matter disease. 2.  No displaced fracture or dislocation of the facial bones. 3. There is a probable mucoid retention cyst or polyp near the left ostiomeatal complex (series 14, image 30) with a small fluid level in the left maxillary sinus. Correlate for sinusitis symptoms. Electronically Signed   By: Eddie Candle M.D.   On: 08/20/2019 14:16   Dg Knee Complete 4 Views Left  Result Date: 08/20/2019 CLINICAL DATA:  Fall with anterior left knee pain. EXAM: LEFT KNEE - COMPLETE 4+ VIEW COMPARISON:  None. FINDINGS: There is a comminuted patellar fracture with overlying soft tissue swelling. A moderate knee joint effusion is also noted. IMPRESSION: Comminuted patellar fracture with associated moderate knee joint effusion. Electronically Signed   By: Zerita Boers M.D.   On: 08/20/2019 14:07   Dg Hand Complete Left  Result Date: 08/20/2019 CLINICAL DATA:  Fall with hand pain EXAM:  LEFT HAND - COMPLETE 3+ VIEW COMPARISON:  None. FINDINGS: There is no evidence of fracture or dislocation. There is  no evidence of arthropathy or other focal bone abnormality. Sclerosis and deformity of the distal ulna may represent sequela of prior injury. The bones are demineralized. Soft tissues are unremarkable. IMPRESSION: No acute osseous injury. Electronically Signed   By: Zerita Boers M.D.   On: 08/20/2019 14:11   Dg Hand Complete Right  Result Date: 08/20/2019 CLINICAL DATA:  Fall with hand pain EXAM: RIGHT HAND - COMPLETE 3+ VIEW COMPARISON:  None. FINDINGS: There is no evidence of fracture or dislocation. There is no evidence of arthropathy or other focal bone abnormality. The bones are demineralized. Soft tissues are unremarkable. IMPRESSION: No acute osseous injury. Electronically Signed   By: Zerita Boers M.D.   On: 08/20/2019 14:12   Ct Maxillofacial Wo Contrast  Result Date: 08/20/2019 CLINICAL DATA:  Fall, anticoagulated EXAM: CT HEAD WITHOUT CONTRAST CT MAXILLOFACIAL WITHOUT CONTRAST TECHNIQUE: Multidetector CT imaging of the head and maxillofacial structures were performed using the standard protocol without intravenous contrast. Multiplanar CT image reconstructions of the maxillofacial structures were also generated. COMPARISON:  None. FINDINGS: CT HEAD FINDINGS Brain: No evidence of acute infarction, hemorrhage, hydrocephalus, extra-axial collection or mass lesion/mass effect. Periventricular white matter hypodensity. Vascular: No hyperdense vessel or unexpected calcification. Skull: Normal. Negative for fracture or focal lesion. Other: None. CT MAXILLOFACIAL FINDINGS Osseous: No fracture or mandibular dislocation. No destructive process. Orbits: Negative. No traumatic or inflammatory finding. Sinuses: There is a probable mucoid retention cyst or polyp near the left ostiomeatal complex (series 14, image 30) with a small fluid level in the left maxillary sinus. Soft tissues: Negative.  IMPRESSION: 1. No acute intracranial pathology. Small-vessel white matter disease. 2.  No displaced fracture or dislocation of the facial bones. 3. There is a probable mucoid retention cyst or polyp near the left ostiomeatal complex (series 14, image 30) with a small fluid level in the left maxillary sinus. Correlate for sinusitis symptoms. Electronically Signed   By: Eddie Candle M.D.   On: 08/20/2019 14:16    Procedures .Marland KitchenLaceration Repair  Date/Time: 08/20/2019 3:20 PM Performed by: Frederica Kuster, PA-C Authorized by: Frederica Kuster, PA-C   Consent:    Consent obtained:  Verbal   Consent given by:  Patient   Risks discussed:  Infection, pain, poor cosmetic result and poor wound healing   Alternatives discussed:  No treatment Anesthesia (see MAR for exact dosages):    Anesthesia method:  Local infiltration   Local anesthetic:  Lidocaine 1% w/o epi Laceration details:    Location:  Lip   Lip location:  Upper lip, full thickness   Vermilion border involved: no     Length (cm):  1.5 Repair type:    Repair type:  Simple Pre-procedure details:    Preparation:  Patient was prepped and draped in usual sterile fashion and imaging obtained to evaluate for foreign bodies Exploration:    Hemostasis achieved with:  Direct pressure   Wound exploration: wound explored through full range of motion and entire depth of wound probed and visualized     Wound extent: no foreign bodies/material noted and no muscle damage noted     Contaminated: no   Treatment:    Area cleansed with:  Saline   Amount of cleaning:  Standard   Irrigation solution:  Sterile saline   Irrigation volume:  50cc   Irrigation method:  Syringe   Visualized foreign bodies/material removed: no   Skin repair:    Repair method:  Sutures   Suture size:  5-0  Wound skin closure material used: Vicryl rapide.   Suture technique:  Simple interrupted   Number of sutures:  3 Approximation:    Approximation:   Close Post-procedure details:    Dressing:  Adhesive bandage and bulky dressing   Patient tolerance of procedure:  Tolerated well, no immediate complications   (including critical care time)  Medications Ordered in ED Medications  morphine 4 MG/ML injection 4 mg (4 mg Intravenous Given 08/20/19 1413)  lidocaine (PF) (XYLOCAINE) 1 % injection 10 mL (10 mLs Infiltration Given 08/20/19 1408)  Tdap (BOOSTRIX) injection 0.5 mL (0.5 mLs Intramuscular Given 08/20/19 1415)  heparin lock flush 100 unit/mL (500 Units Intracatheter Given 08/20/19 1514)     Initial Impression / Assessment and Plan / ED Course  I have reviewed the triage vital signs and the nursing notes.  Pertinent labs & imaging results that were available during my care of the patient were reviewed by me and considered in my medical decision making (see chart for details).        Patient presenting following fall.  CT head and maxillofacial are negative for acute findings.  X-rays of bilateral hands and chest are negative.  Patient does have a comminuted left patellar fracture.  She has a through and through lip laceration.  Tetanus updated.  Considering patient's cancer and chemo status, I repaired the outside, but did not repair the mucosal side as do not want increased risk of infection.  Patient also states the hole does not feel very large with her tongue.  Encouraged salt water rinses after eating.  Patient placed in knee immobilizer per recommendation of orthopedist, Dr. Ninfa Linden, who evaluated the patient.  Follow-up with him next week.  Wound care discussed.  Return in 5 to 7 days if sutures are not falling out.  Patient reports she is running out of the oxycodone she has for her cancer at home.  Will discharge home with a short course more considering patient's fracture.  Reviewed the [place] narcotic database and found no discrepancies.  Patient understands and agrees with plan.  Patient vitals stable throughout ED course and  discharged in satisfactory addition. I discussed patient case with Dr. Vanita Panda who guided the patient's management and agrees with plan.   Final Clinical Impressions(s) / ED Diagnoses   Final diagnoses:  Fall, initial encounter  Lip laceration, initial encounter  Closed nondisplaced comminuted fracture of left patella, initial encounter    ED Discharge Orders         Ordered    oxyCODONE (ROXICODONE) 5 MG immediate release tablet  Every 6 hours PRN     08/20/19 [address] [place], PA-C 08/20/19 1525    Carmin Muskrat, MD 08/23/19 1801

## 2019-08-20 NOTE — Progress Notes (Signed)
At approximately 1145 08/20/2019 patient walking into infusion room for pump stop/PAC deaccess and fell face-first. Patient states "I just tripped!" Multiple RNs on scene immediately. This RN noted blood saturating mask and took mask off to find laceration on top lip- philtrum area. Also noted skin tear on left hand and left elbow with minimal bleeding. Bleeding controlled with gauze on lip and gauze with tegaderm on elbow and hand. Patient's vitals noted to be stable (see vital flowsheet). Patient c/o bilateral knee pain but no skin tear or abrasion visible. Sandi Mealy, PA-C to see patient. This RN noted blood return upon port flush, took chemo off but left PAC accessed for emergency department. At approximately 1150, patient c/o of chest pain and stated "but not like a heart attack, I feel like I cracked my chest." No abrasion or bruising noted, PAC needle noted to be straight, intact, with dressing intact and clean. Blood return noted again in port, and patient hooked up to normal saline. Pt lifted onto stretcher and wheeled to the emergency department with PA Tanner and Laurence Compton, Assistant Director of the [employer] Son and husband updated via phone call.

## 2019-08-20 NOTE — Discharge Instructions (Addendum)
Take oxycodone every 6 hours as needed for severe pain.  Use ice to your knee 3-4 times daily alternating 20 minutes on, 20 minutes off.  Elevate whenever you are not walking on it.  Keep it straight at all times, do not try to bend it.  Wear knee immobilizer whenever you try to walk and at night.  If you are laying flat on a couch or bed, you can take off the knee immobilizer.  Please follow-up with the orthopedic doctor by calling to make an appointment for next week today or Monday.  Please return to the emergency department or see your doctor for suture removal if you cannot pull out the sutures in 5 to 7 days with a gentle tug.  Please return sooner if you develop increasing pain, redness, swelling, drainage, red streaking from the wound.  Rinse your mouth with salt water after you eat and make sure no food gets stuck in the inside hole.

## 2019-08-20 NOTE — ED Notes (Signed)
Per pt request, son Erlene Quan notified of discharge status.  Son reported he would be here to pick pt up in appx 20 min.

## 2019-08-20 NOTE — ED Notes (Signed)
Pt c/o constant CP, reports "it just hurts from where I fell on it."

## 2019-08-20 NOTE — ED Triage Notes (Addendum)
Pt from Cancer center d/t mechanical fall.  Hx of metastatic colon cancer.  Pt was finishing up todays cancer tx and tripped.  Skin tear on left hand, laceration above lip.  Pt taking xarelto d/t blood clot in abdomen.   Pt c/o CP.  Pt arrived from Cancer center with port accessed. Pt did not lose consciousness.    Cancer center RN expressed concern that Port-A-Cath needle may be slightly bent.  Reports that cath is still flushing and drawing well.

## 2019-08-20 NOTE — ED Notes (Signed)
Pt wheeled to ED entrance to meet son.  Pt assisted into car.

## 2019-08-20 NOTE — ED Notes (Signed)
Discharge paperwork reviewed with pt.  Port deaccessed post heparin flush.  Site dressed with bandaid.  Pt assisted with dressing.  Awaiting sons arrival.

## 2019-08-20 NOTE — Progress Notes (Signed)
[facility] Progress Note   Yolanda Watson [id number] 08-Oct-1951 68 y.o.  Yolanda Watson is managed by Dr. Dominica Severin B. Sherrill  Actively treated with chemotherapy/immunotherapy/hormonal therapy: yes  Current therapy: FOLFIRI  Last treated: 08/18/2019 (cycle 5, day 1)  Next scheduled appointment with provider: 09/02/2019  Assessment: Plan:    Metastatic colon cancer to liver ([place])  Blunt force injury   Metastatic colon cancer: Yolanda Watson is status post cycle 5, day 1 of FOLFIRI which was dosed on 08/18/2019. She will be seen in follow up on 09/02/2019.  Blunt force trauma: Yolanda Watson rubber sole shoes caught as she was leaving the infusion room today which caused her to fall forward. She had a laceration in her upper lip with skin tears on her left hand and elbows. She was taken to the ER for evaluation and management.  Please see After Visit Summary for patient specific instructions.  Future Appointments  Date Time Provider [place]  08/30/2019  1:30 PM [facility] [place]  09/02/2019  9:45 AM [facility] 6 [facility] None  09/02/2019 10:00 AM [facility] [place] None  09/02/2019 10:15 AM Owens Shark, NP [facility] None  09/02/2019 11:30 AM [facility] INFUSION [facility] None    No orders of the defined types were placed in this encounter.      Subjective:   Patient ID:  Yolanda Watson is a 68 y.o. (DOB 01/22/1951) female.  Chief Complaint: No chief complaint on file.   HPI Yolanda Watson  is a 68 y.o. female with history of a metastatic colon cancer who is managed by Dr. Benay Spice and is status post cycle 5, day 1 of FOLFIRI which was dosed on 08/18/2019. [name] rubber sole shoes caught as she was leaving the infusion room today which caused her to fall forward. She had a laceration in her upper lip with skin tears on her left hand and elbows. She had no LOC.  Medications: I have reviewed the patient's current  medications.  Allergies:  Allergies  Allergen Reactions   Penicillins Other (See Comments)    Tolerated cefepime 01/01/2018 "PASSED OUT AS A CHILD" > ? SYNCOPE ? PATIENT HAS HAD A PCN REACTION WITH IMMEDIATE RASH, FACIAL/TONGUE/THROAT SWELLING, SOB, OR LIGHTHEADEDNESS WITH HYPOTENSION:  #  #  #  YES  #  #  #   Has patient had a PCN reaction causing severe rash involving mucus membranes or skin necrosis: No Has patient had a PCN reaction that required hospitalization: No Has patient had a PCN reaction occurring within the last 10 years: No    Latex Itching and Rash    BED "CHUX PADS"    Past Medical History:  Diagnosis Date   Acquired bilateral hand deformity    both hands with severe digit contracture. VERY limited ROM    Alcohol abuse    Colon cancer ([place]) 2016   metastatic to abdominal wall   Port-A-Cath in place since 04-2017   right chest     Past Surgical History:  Procedure Laterality Date   ABDOMINAL SURGERY  2016   ABDOMINOPLASTY N/A 10/03/2017   Procedure: ABDOMINAL WALL RECONSTRUCTION WITH STATTICE;  Surgeon: [name], MD;  [facility];  Service: Plastics;  Laterality: N/A;   DEBRIDEMENT AND CLOSURE WOUND N/A 01/01/2018   Procedure: DEBRIDEMENT AND CLOSURE ABDOMINAL WOUND;  Surgeon: [name], MD;  Location: [place];  Service: Plastics;  Laterality: N/A;   EXCISION OF ABDOMINAL WALL TUMOR  N/A 10/03/2017   Procedure: EXCISION OF COLON CANCER TUMOR FROM ABDOMINAL;  Surgeon: Stark Klein, MD;  [facility];  Service: General;  Laterality: N/A;  TAP BLOCK   INCISION AND DRAINAGE OF WOUND N/A 11/14/2017   Procedure: IRRIGATION AND DEBRIDEMENT OF ABDOMINAL WALL;  Surgeon: [name], MD;  Location: [place];  Service: Plastics;  Laterality: N/A;   IR FLUORO GUIDE PORT INSERTION RIGHT  05/09/2017   IR US GUIDE VASC ACCESS RIGHT  05/09/2017   port-a-cath      Family History  Problem Relation Age of Onset   Heart disease Mother     Stroke Maternal Aunt    Cancer Neg Hx    Diabetes Neg Hx     Social History   Socioeconomic History   Marital status: Married    Spouse name: Not on file   Number of children: Not on file   Years of education: Not on file   Highest education level: Not on file  Occupational History   Occupation: retired  Scientist, product/process development strain: Not on file   Food insecurity    Worry: Not on file    Inability: Not on Lexicographer needs    Medical: Not on file    Non-medical: Not on file  Tobacco Use   Smoking status: Current Every Day Smoker    Packs/day: 1.00    Years: 45.00    Pack years: 45.00    Types: Cigarettes   Smokeless tobacco: Never Used   Tobacco comment: She has not smoked since 12/29/17  Substance and Sexual Activity   Alcohol use: Yes    Alcohol/week: 2.0 - 4.0 standard drinks    Types: 2 - 4 Shots of liquor per week    Comment: denies problem with ETOH but contineus to drink "a couple of drinks per day"   Drug use: No   Sexual activity: Not on file  Lifestyle   Physical activity    Days per week: Not on file    Minutes per session: Not on file   Stress: Not on file  Relationships   Social connections    Talks on phone: Not on file    Gets together: Not on file    Attends religious service: Not on file    Active member of club or organization: Not on file    Attends meetings of clubs or organizations: Not on file    Relationship status: Not on file   Intimate partner violence    Fear of current or ex partner: Not on file    Emotionally abused: Not on file    Physically abused: Not on file    Forced sexual activity: Not on file  Other Topics Concern   Not on file  Social History Narrative   Not on file    Past Medical History, Surgical history, Social history, and Family history were reviewed and updated as appropriate.   Please see review of systems for further details on the patient's review from today.    Review of Systems:  Review of Systems  Constitutional: Negative for chills, diaphoresis and fever.  HENT: Negative for trouble swallowing and voice change.   Respiratory: Negative for cough, chest tightness, shortness of breath and wheezing.   Cardiovascular: Positive for chest pain. Negative for palpitations.  Gastrointestinal: Negative for abdominal pain, constipation, diarrhea, nausea and vomiting.  Musculoskeletal: Positive for arthralgias. Negative for back pain and myalgias.  Skin: Positive for wound.  Neurological: Negative for dizziness, light-headedness and headaches.    Objective:   Physical Exam:  There were no vitals taken for this visit. ECOG: 1  Physical Exam Constitutional:      General: She is not in acute distress.    Appearance: Normal appearance. She is not ill-appearing or diaphoretic.  HENT:     Head: Normocephalic.   Cardiovascular:     Rate and Rhythm: Normal rate and regular rhythm.     Heart sounds: No murmur. No friction rub. No gallop.   Pulmonary:     Effort: Pulmonary effort is normal. No respiratory distress.     Breath sounds: Normal breath sounds. No wheezing or rales.  Skin:      Neurological:     Mental Status: She is alert.  Psychiatric:        Mood and Affect: Mood normal.        Behavior: Behavior normal.        Thought Content: Thought content normal.        Judgment: Judgment normal.     Lab Review:     Component Value Date/Time   NA 140 08/18/2019 0856   NA 141 01/15/2018   NA 138 08/07/2017 0835   K 3.7 08/18/2019 0856   K 4.1 08/07/2017 0835   CL 110 08/18/2019 0856   CO2 20 (L) 08/18/2019 0856   CO2 25 08/07/2017 0835   GLUCOSE 109 (H) 08/18/2019 0856   GLUCOSE 95 08/07/2017 0835   BUN 18 08/18/2019 0856   BUN 4 01/15/2018   BUN 9.3 08/07/2017 0835   CREATININE 0.95 08/18/2019 0856   CREATININE 0.57 02/05/2018 1144   CREATININE 0.7 08/07/2017 0835   CALCIUM 8.2 (L) 08/18/2019 0856   CALCIUM 9.4 08/07/2017  0835   PROT 6.4 (L) 08/18/2019 0856   PROT 7.3 08/07/2017 0835   ALBUMIN 3.4 (L) 08/18/2019 0856   ALBUMIN 3.4 (L) 08/07/2017 0835   AST 12 (L) 08/18/2019 0856   AST 25 08/07/2017 0835   ALT 11 08/18/2019 0856   ALT 21 08/07/2017 0835   ALKPHOS 160 (H) 08/18/2019 0856   ALKPHOS 138 08/07/2017 0835   BILITOT 0.2 (L) 08/18/2019 0856   BILITOT 0.49 08/07/2017 0835   GFRNONAA >60 08/18/2019 0856   GFRAA >60 08/18/2019 0856       Component Value Date/Time   WBC 19.2 (H) 08/18/2019 0856   WBC 8.6 02/05/2018 1144   RBC 3.61 (L) 08/18/2019 0856   HGB 11.4 (L) 08/18/2019 0856   HGB 12.4 08/07/2017 0835   HCT 34.9 (L) 08/18/2019 0856   HCT 37.2 08/07/2017 0835   PLT 139 (L) 08/18/2019 0856   PLT 301 08/07/2017 0835   MCV 96.7 08/18/2019 0856   MCV 100.7 08/07/2017 0835   MCH 31.6 08/18/2019 0856   MCHC 32.7 08/18/2019 0856   RDW 16.6 (H) 08/18/2019 0856   RDW 18.5 (H) 08/07/2017 0835   LYMPHSABS 1.7 08/18/2019 0856   LYMPHSABS 1.8 08/07/2017 0835   MONOABS 0.9 08/18/2019 0856   MONOABS 0.6 08/07/2017 0835   EOSABS 0.2 08/18/2019 0856   EOSABS 0.2 08/07/2017 0835   BASOSABS 0.1 08/18/2019 0856   BASOSABS 0.1 08/07/2017 0835   -------------------------------  Imaging from last 24 hours (if applicable):  Radiology interpretation: Dg Chest 2 View  Result Date: 08/20/2019 CLINICAL DATA:  Fall from standing. EXAM: CHEST - 2 VIEW COMPARISON:  CT abdomen and pelvis dated 06/17 2020. FINDINGS: The heart size and mediastinal contours are within normal  limits. Scarring is seen in the right lung base. No pleural effusion or pneumothorax is identified. The left lung is clear. The visualized skeletal structures are unremarkable. A right internal jugular central venous port catheter tip overlies the superior cavoatrial junction. IMPRESSION: No active cardiopulmonary disease. Electronically Signed   By: Zerita Boers M.D.   On: 08/20/2019 14:16   Ct Head Wo Contrast  Result Date:  08/20/2019 CLINICAL DATA:  Fall, anticoagulated EXAM: CT HEAD WITHOUT CONTRAST CT MAXILLOFACIAL WITHOUT CONTRAST TECHNIQUE: Multidetector CT imaging of the head and maxillofacial structures were performed using the standard protocol without intravenous contrast. Multiplanar CT image reconstructions of the maxillofacial structures were also generated. COMPARISON:  None. FINDINGS: CT HEAD FINDINGS Brain: No evidence of acute infarction, hemorrhage, hydrocephalus, extra-axial collection or mass lesion/mass effect. Periventricular white matter hypodensity. Vascular: No hyperdense vessel or unexpected calcification. Skull: Normal. Negative for fracture or focal lesion. Other: None. CT MAXILLOFACIAL FINDINGS Osseous: No fracture or mandibular dislocation. No destructive process. Orbits: Negative. No traumatic or inflammatory finding. Sinuses: There is a probable mucoid retention cyst or polyp near the left ostiomeatal complex (series 14, image 30) with a small fluid level in the left maxillary sinus. Soft tissues: Negative. IMPRESSION: 1. No acute intracranial pathology. Small-vessel white matter disease. 2.  No displaced fracture or dislocation of the facial bones. 3. There is a probable mucoid retention cyst or polyp near the left ostiomeatal complex (series 14, image 30) with a small fluid level in the left maxillary sinus. Correlate for sinusitis symptoms. Electronically Signed   By: Eddie Candle M.D.   On: 08/20/2019 14:16   Dg Knee Complete 4 Views Left  Result Date: 08/20/2019 CLINICAL DATA:  Fall with anterior left knee pain. EXAM: LEFT KNEE - COMPLETE 4+ VIEW COMPARISON:  None. FINDINGS: There is a comminuted patellar fracture with overlying soft tissue swelling. A moderate knee joint effusion is also noted. IMPRESSION: Comminuted patellar fracture with associated moderate knee joint effusion. Electronically Signed   By: Zerita Boers M.D.   On: 08/20/2019 14:07   Dg Hand Complete Left  Result Date:  08/20/2019 CLINICAL DATA:  Fall with hand pain EXAM: LEFT HAND - COMPLETE 3+ VIEW COMPARISON:  None. FINDINGS: There is no evidence of fracture or dislocation. There is no evidence of arthropathy or other focal bone abnormality. Sclerosis and deformity of the distal ulna may represent sequela of prior injury. The bones are demineralized. Soft tissues are unremarkable. IMPRESSION: No acute osseous injury. Electronically Signed   By: Zerita Boers M.D.   On: 08/20/2019 14:11   Dg Hand Complete Right  Result Date: 08/20/2019 CLINICAL DATA:  Fall with hand pain EXAM: RIGHT HAND - COMPLETE 3+ VIEW COMPARISON:  None. FINDINGS: There is no evidence of fracture or dislocation. There is no evidence of arthropathy or other focal bone abnormality. The bones are demineralized. Soft tissues are unremarkable. IMPRESSION: No acute osseous injury. Electronically Signed   By: Zerita Boers M.D.   On: 08/20/2019 14:12   Ct Maxillofacial Wo Contrast  Result Date: 08/20/2019 CLINICAL DATA:  Fall, anticoagulated EXAM: CT HEAD WITHOUT CONTRAST CT MAXILLOFACIAL WITHOUT CONTRAST TECHNIQUE: Multidetector CT imaging of the head and maxillofacial structures were performed using the standard protocol without intravenous contrast. Multiplanar CT image reconstructions of the maxillofacial structures were also generated. COMPARISON:  None. FINDINGS: CT HEAD FINDINGS Brain: No evidence of acute infarction, hemorrhage, hydrocephalus, extra-axial collection or mass lesion/mass effect. Periventricular white matter hypodensity. Vascular: No hyperdense vessel or unexpected calcification. Skull:  Normal. Negative for fracture or focal lesion. Other: None. CT MAXILLOFACIAL FINDINGS Osseous: No fracture or mandibular dislocation. No destructive process. Orbits: Negative. No traumatic or inflammatory finding. Sinuses: There is a probable mucoid retention cyst or polyp near the left ostiomeatal complex (series 14, image 30) with a small fluid level  in the left maxillary sinus. Soft tissues: Negative. IMPRESSION: 1. No acute intracranial pathology. Small-vessel white matter disease. 2.  No displaced fracture or dislocation of the facial bones. 3. There is a probable mucoid retention cyst or polyp near the left ostiomeatal complex (series 14, image 30) with a small fluid level in the left maxillary sinus. Correlate for sinusitis symptoms. Electronically Signed   By: Eddie Candle M.D.   On: 08/20/2019 14:16        This case was discussed with Dr. Benay Spice. He expressed agreement with my management of this patient.

## 2019-08-20 NOTE — ED Notes (Signed)
Per Lucianne Lei, patient slipped and fell forward-lip laceration-patient is on blood thinners-bleeding controlled at this time

## 2019-08-24 ENCOUNTER — Other Ambulatory Visit: Payer: Self-pay | Admitting: Nurse Practitioner

## 2019-08-24 DIAGNOSIS — C787 Secondary malignant neoplasm of liver and intrahepatic bile duct: Secondary | ICD-10-CM

## 2019-08-24 DIAGNOSIS — C189 Malignant neoplasm of colon, unspecified: Secondary | ICD-10-CM

## 2019-08-24 MED ORDER — OXYCODONE HCL 5 MG PO TABS: 5.0000 mg | ORAL_TABLET | Freq: Four times a day (QID) | ORAL | 0 refills | Status: DC | PRN

## 2019-08-26 ENCOUNTER — Ambulatory Visit: Payer: Medicare Other | Admitting: Physician Assistant

## 2019-08-28 ENCOUNTER — Other Ambulatory Visit: Payer: Self-pay | Admitting: Oncology

## 2019-08-30 ENCOUNTER — Ambulatory Visit (HOSPITAL_COMMUNITY): Admission: RE | Admit: 2019-08-30 | Payer: Medicare Other | Source: Ambulatory Visit

## 2019-08-30 ENCOUNTER — Telehealth: Payer: Self-pay

## 2019-08-30 ENCOUNTER — Ambulatory Visit: Payer: Medicare Other | Admitting: Physician Assistant

## 2019-08-30 NOTE — Telephone Encounter (Signed)
Pt called to inform MD Sherrill's office that she had to cancel her CT today due to a broken patella. This RN confirmed that MD Benay Spice will be informed.

## 2019-09-01 ENCOUNTER — Other Ambulatory Visit: Payer: Self-pay

## 2019-09-01 ENCOUNTER — Ambulatory Visit (INDEPENDENT_AMBULATORY_CARE_PROVIDER_SITE_OTHER): Payer: Medicare Other

## 2019-09-01 ENCOUNTER — Ambulatory Visit (INDEPENDENT_AMBULATORY_CARE_PROVIDER_SITE_OTHER): Payer: Medicare Other | Admitting: Physician Assistant

## 2019-09-01 ENCOUNTER — Encounter: Payer: Self-pay | Admitting: Physician Assistant

## 2019-09-01 ENCOUNTER — Telehealth: Payer: Self-pay | Admitting: *Deleted

## 2019-09-01 VITALS — Ht 66.5 in | Wt 100.0 lb

## 2019-09-01 DIAGNOSIS — S82002D Unspecified fracture of left patella, subsequent encounter for closed fracture with routine healing: Secondary | ICD-10-CM

## 2019-09-01 NOTE — Progress Notes (Signed)
Office Visit Note   Patient: Yolanda Watson           Date of Birth: Jul 27, 1951           MRN: [id number] Visit Date: 09/01/2019              Requested by: No referring provider defined for this encounter. PCP: Patient, No Pcp Per   Assessment & Plan: Visit Diagnoses:  1. Closed displaced fracture of left patella with routine healing, unspecified fracture morphology, subsequent encounter     Plan: Explained to her the importance of keeping the knee straight not bending it.  She is given a prescription for a Bledsoe brace which she will obtain from [employer] this will be locked out at full extension she is weightbearing as tolerated in the Bledsoe brace.  She should wear it at all times she can come out of it just for comfort when just sitting around but she definitely needs to wear it to bed at night.  We will see her back in 2 weeks we will obtain a lateral view of the left knee at that time.  Questions were encouraged and answered.  Follow-Up Instructions: Return in about 2 weeks (around 09/15/2019) for Radiographs.   Orders:  Orders Placed This Encounter  Procedures  . XR Knee 1-2 Views Left   No orders of the defined types were placed in this encounter.     Procedures: No procedures performed   Clinical Data: No additional findings.   Subjective: Chief Complaint  Patient presents with  . Left Knee - Pain    DOI 08/20/2019    HPI Yolanda Watson is a 68 year old female who caught her shoe on 08/20/2019 plan directly on her knee as a result of.  She denies any loss of consciousness dizziness.  She was seen in the [facility] ER on 08/20/2019 and was found to have a minimally displaced patella fracture.  She is placed in a knee immobilizer given oxycodone for pain.  She follows up here today now 9 days status post injury.   Review of Systems Please see HPI otherwise negative or noncontributory  Objective: Vital Signs: Ht 5' 6.5" (1.689 m)   Wt 100 lb (45.4 kg)   BMI  15.90 kg/m   Physical Exam Constitutional:      Appearance: She is normal weight. She is not ill-appearing or diaphoretic.  Pulmonary:     Effort: Pulmonary effort is normal.  Neurological:     Mental Status: She is alert and oriented to person, place, and time.     Ortho Exam Left knee tenderness over the patella.  Nontender over the medial lateral joint lines.  No instability valgus varus stressing.  She is able to do a straight leg raise on the left.  Left calf supple nontender.  No significant effusion.  No erythema or rashes. Specialty Comments:  No specialty comments available.  Imaging: Xr Knee 1-2 Views Left  Result Date: 09/01/2019 Left knee lateral view: Shows the comminuted patella fracture to remain in overall good position alignment with minimal displacement.    PMFS History: Patient Active Problem List   Diagnosis Date Noted  . Metastatic colon cancer to liver ([place]) 06/17/2019  . Goals of care, counseling/discussion 06/17/2019  . At risk for adverse drug event 01/13/2018  . Anemia, unspecified 01/11/2018  . Abdominal pain, lower   . Encounter for palliative care   . Abdominal wall abscess at site of surgical wound   . Acute  osteomyelitis of pelvic region [facility])   . Open wound of abdomen 11/14/2017  . Port-A-Cath in place 10/21/2017  . Local recurrence of colon cancer ([place]) 10/03/2017  . Secondary malignancy of inguinal lymph nodes ([place]) 07/29/2017  . Cancer of right colon ([place]) 05/05/2017   Past Medical History:  Diagnosis Date  . Acquired bilateral hand deformity    both hands with severe digit contracture. VERY limited ROM   . Alcohol abuse   . Colon cancer ([place]) 2016   metastatic to abdominal wall  . Port-A-Cath in place since 04-2017   right chest     Family History  Problem Relation Age of Onset  . Heart disease Mother   . Stroke Maternal Aunt   . Cancer Neg Hx   . Diabetes Neg Hx     Past Surgical History:  Procedure Laterality Date  .  ABDOMINAL SURGERY  2016  . ABDOMINOPLASTY N/A 10/03/2017   Procedure: ABDOMINAL WALL RECONSTRUCTION WITH STATTICE;  Surgeon: [name], MD;  [facility];  Service: Plastics;  Laterality: N/A;  . DEBRIDEMENT AND CLOSURE WOUND N/A 01/01/2018   Procedure: DEBRIDEMENT AND CLOSURE ABDOMINAL WOUND;  Surgeon: [name], MD;  Location: [place];  Service: Plastics;  Laterality: N/A;  . EXCISION OF ABDOMINAL WALL TUMOR N/A 10/03/2017   Procedure: EXCISION OF COLON CANCER TUMOR FROM ABDOMINAL;  Surgeon: Stark Klein, MD;  [facility];  Service: General;  Laterality: N/A;  TAP BLOCK  . INCISION AND DRAINAGE OF WOUND N/A 11/14/2017   Procedure: IRRIGATION AND DEBRIDEMENT OF ABDOMINAL WALL;  Surgeon: [name], MD;  Location: [place];  Service: Plastics;  Laterality: N/A;  . IR FLUORO GUIDE PORT INSERTION RIGHT  05/09/2017  . IR US GUIDE VASC ACCESS RIGHT  05/09/2017   port-a-cath     Social History   Occupational History  . Occupation: retired  Tobacco Use  . Smoking status: Current Every Day Smoker    Packs/day: 1.00    Years: 45.00    Pack years: 45.00    Types: Cigarettes  . Smokeless tobacco: Never Used  . Tobacco comment: She has not smoked since 12/29/17  Substance and Sexual Activity  . Alcohol use: Yes    Alcohol/week: 2.0 - 4.0 standard drinks    Types: 2 - 4 Shots of liquor per week    Comment: denies problem with ETOH but contineus to drink "a couple of drinks per day"  . Drug use: No  . Sexual activity: Not on file

## 2019-09-01 NOTE — Telephone Encounter (Signed)
Called patient to communicate Dr. Gearldine Shown concern with waiting to return after her knee heals completely. This could take months and it is not in her best interest to delay this. He wants her to have CT scan next week and be seen few days later. She agreed and scan has been scheduled for 9/9 at 1530. She will be NPO for 4 hours prior and arrive at 1330 to start the water based contrast. She agrees to see NP/MD on 9/14 at 1015 to go over scan and next steps.

## 2019-09-02 ENCOUNTER — Other Ambulatory Visit: Payer: Medicare Other

## 2019-09-02 ENCOUNTER — Ambulatory Visit: Payer: Medicare Other

## 2019-09-02 ENCOUNTER — Ambulatory Visit: Payer: Medicare Other | Admitting: Nurse Practitioner

## 2019-09-08 ENCOUNTER — Encounter (HOSPITAL_COMMUNITY): Payer: Self-pay

## 2019-09-08 ENCOUNTER — Ambulatory Visit (HOSPITAL_COMMUNITY)
Admission: RE | Admit: 2019-09-08 | Discharge: 2019-09-08 | Disposition: A | Discharge status: Home / Self Care | Payer: Medicare Other | Source: Ambulatory Visit | Attending: Nurse Practitioner | Admitting: Nurse Practitioner

## 2019-09-08 ENCOUNTER — Other Ambulatory Visit: Payer: Self-pay

## 2019-09-08 DIAGNOSIS — D490 Neoplasm of unspecified behavior of digestive system: Secondary | ICD-10-CM | POA: Diagnosis not present

## 2019-09-08 DIAGNOSIS — C787 Secondary malignant neoplasm of liver and intrahepatic bile duct: Secondary | ICD-10-CM | POA: Diagnosis not present

## 2019-09-08 DIAGNOSIS — C189 Malignant neoplasm of colon, unspecified: Secondary | ICD-10-CM | POA: Diagnosis not present

## 2019-09-08 MED ORDER — HEPARIN SOD (PORK) LOCK FLUSH 100 UNIT/ML IV SOLN
INTRAVENOUS | Status: AC
  Filled 2019-09-08: qty 5

## 2019-09-08 MED ORDER — IOHEXOL 300 MG/ML  SOLN
75.0000 mL | Freq: Once | INTRAMUSCULAR | Status: AC | PRN
  Administered 2019-09-08: 75 mL via INTRAVENOUS

## 2019-09-08 MED ORDER — SODIUM CHLORIDE (PF) 0.9 % IJ SOLN
INTRAMUSCULAR | Status: AC
  Filled 2019-09-08: qty 50

## 2019-09-08 MED ORDER — HEPARIN SOD (PORK) LOCK FLUSH 100 UNIT/ML IV SOLN
500.0000 [IU] | Freq: Once | INTRAVENOUS | Status: AC
  Administered 2019-09-08: 15:00:00 500 [IU] via INTRAVENOUS

## 2019-09-08 MED ORDER — IOHEXOL 300 MG/ML  SOLN
30.0000 mL | Freq: Once | INTRAMUSCULAR | Status: AC | PRN
  Administered 2019-09-08: 30 mL via ORAL

## 2019-09-11 ENCOUNTER — Other Ambulatory Visit: Payer: Self-pay | Admitting: Oncology

## 2019-09-13 ENCOUNTER — Other Ambulatory Visit: Payer: Self-pay

## 2019-09-13 ENCOUNTER — Inpatient Hospital Stay: Payer: Medicare Other | Attending: Oncology | Admitting: Nurse Practitioner

## 2019-09-13 ENCOUNTER — Encounter: Payer: Self-pay | Admitting: Nurse Practitioner

## 2019-09-13 VITALS — BP 116/67 | HR 93 | Temp 98.0°F | Resp 16 | Ht 66.5 in | Wt 101.7 lb

## 2019-09-13 DIAGNOSIS — Z79899 Other long term (current) drug therapy: Secondary | ICD-10-CM | POA: Diagnosis not present

## 2019-09-13 DIAGNOSIS — C7989 Secondary malignant neoplasm of other specified sites: Secondary | ICD-10-CM | POA: Diagnosis not present

## 2019-09-13 DIAGNOSIS — C787 Secondary malignant neoplasm of liver and intrahepatic bile duct: Secondary | ICD-10-CM

## 2019-09-13 DIAGNOSIS — N2889 Other specified disorders of kidney and ureter: Secondary | ICD-10-CM | POA: Insufficient documentation

## 2019-09-13 DIAGNOSIS — R911 Solitary pulmonary nodule: Secondary | ICD-10-CM | POA: Insufficient documentation

## 2019-09-13 DIAGNOSIS — Z5111 Encounter for antineoplastic chemotherapy: Secondary | ICD-10-CM | POA: Diagnosis not present

## 2019-09-13 DIAGNOSIS — R590 Localized enlarged lymph nodes: Secondary | ICD-10-CM | POA: Insufficient documentation

## 2019-09-13 DIAGNOSIS — Z923 Personal history of irradiation: Secondary | ICD-10-CM | POA: Diagnosis not present

## 2019-09-13 DIAGNOSIS — C786 Secondary malignant neoplasm of retroperitoneum and peritoneum: Secondary | ICD-10-CM | POA: Diagnosis not present

## 2019-09-13 DIAGNOSIS — C189 Malignant neoplasm of colon, unspecified: Secondary | ICD-10-CM | POA: Diagnosis not present

## 2019-09-13 DIAGNOSIS — C778 Secondary and unspecified malignant neoplasm of lymph nodes of multiple regions: Secondary | ICD-10-CM | POA: Insufficient documentation

## 2019-09-13 DIAGNOSIS — D701 Agranulocytosis secondary to cancer chemotherapy: Secondary | ICD-10-CM | POA: Diagnosis not present

## 2019-09-13 MED ORDER — OXYCODONE HCL 5 MG PO TABS: 5.0000 mg | ORAL_TABLET | Freq: Four times a day (QID) | ORAL | 0 refills | Status: DC | PRN

## 2019-09-13 MED ORDER — RIVAROXABAN 20 MG PO TABS: 20.0000 mg | ORAL_TABLET | Freq: Every day | ORAL | 1 refills | Status: DC

## 2019-09-13 MED FILL — XARELTO 20 MG TABLET: 20 | 30 days supply | Qty: 30 | Fill #0

## 2019-09-13 NOTE — Progress Notes (Addendum)
[place] OFFICE PROGRESS NOTE   Diagnosis: Colon cancer  INTERVAL HISTORY:   Yolanda Watson returns for follow-up.  She completed cycle 5 FOLFIRI 08/18/2019.  She fell on 08/20/2019.  She was found to have a left patellar fracture.  She was placed in a brace per orthopedics.  Knee pain is improving.  She takes oxycodone as needed.  No nausea or vomiting.  No mouth sores.  No diarrhea.  No rash.  No fever.  Objective:  Vital signs in last 24 hours:  Blood pressure 116/67, pulse 93, temperature 98 F (36.7 C), temperature source Oral, resp. rate 16, height 5' 6.5" (1.689 m), weight 101 lb 11.2 oz (46.1 kg), SpO2 98 %.    HEENT: No thrush or ulcers. Lymphatics: Approximate 1 cm firm left inguinal lymph node. GI: Abdomen soft and nontender.  No hepatomegaly. Vascular: Pitting edema left foot. Neuro: Alert and oriented. Skin: No rash. Musculoskeletal: Left knee brace. Port-A-Cath without erythema.   Lab Results:  Lab Results  Component Value Date   WBC 19.2 (H) 08/18/2019   HGB 11.4 (L) 08/18/2019   HCT 34.9 (L) 08/18/2019   MCV 96.7 08/18/2019   PLT 139 (L) 08/18/2019   NEUTROABS 16.0 (H) 08/18/2019    Imaging:  No results found.  Medications: I have reviewed the patient's current medications.  Assessment/Plan: 1. Metastatic colon cancer presenting with an Abdominal/pelvic mass  2. [place]-pathology report dated 02/15/2015-invasive adenocarcinoma, well-differentiated with prominent mucinous features; microscopic tumor extension into mesenteric adipose tissue and focally involving visceral peritoneum; radial and mucosal margins negative; lymph-vascular invasion withsuspicious foci identified;perineural invasion not identified; 16 negative lymph nodes; tumor deposits not identified; pT4apN0;MSI stable  ? CT 04/22/2017 revealed a heterogenous anterior pelvic wall mass, right inguinal mass, top normal sized  retroperitoneal and iliac nodes, abutment of the anterior dome of the bladder and a loop of small bowel ? 04/29/2017 biopsy of anterior abdominal/pelvic wall mass-acellular mucin dissecting fibrous stroma ? Cycle 1 FOLFOX 05/15/2017 ? Cycle 2 FOLFOX 05/28/2017 ? Cycle 3 FOLFOX with Avastin on 06/11/2017 ? Cycle 4 FOLFOX/Avastin 06/25/2017 ? Cycle 5 FOLFOX/Avastin 07/10/2017, Neulasta added ? Restaging CT 07/21/2017-mild decrease in the size of a right inguinal node and the anterior abdominal wall mass ? Radiation to the abdominal wall mass 08/18/2017-09/03/2017 ? Status post excision of abdominal wall mass 10/03/2017--pathology showed adenocarcinoma with excessive extracellular mucin. Margins negative for tumor. Right inguinal wall mass also excised showing metastatic adenocarcinoma with excessive extracellular mucin in 1 of 8 lymph nodes. ? CT abdomen/pelvis 06/16/2019-new right hepatic lobe lesion measuring 2.7 x 1.7 cm. Hypoattenuation within the pancreatic uncinate process measuring 1.5 x 1.6 cm. Mild left adrenal thickening. Upper pole left renal mass 3.0 x 3.6 cm. Infiltrative hypoenhancement involving the lower pole left kidney. Nonocclusive thrombus within the infrarenal IVC. Necrotic adenopathy within the small bowel mesentery, new. New necrotic left inguinal nodes. Lytic lesion within and adjacent the anterior side of the L3 vertebral body. Contiguous mass within the left psoas measuring 4.6 x 3.4 cm. ? Cycle 1 FOLFIRI 06/23/2019 ? Cycle 2 FOLFIRI 07/08/2019, Udenyca added  ? Cycle 3 FOLFIRI 07/22/2019 ? Cycle 4 FOLFIRI 08/04/2019 ? Cycle 5 FOLFIRI 08/18/2019 ? CTs 09/08/2019- 3.4 x 2.9 cm metastasis in segment 7 liver, previously 2.7 x 1.7 cm.  14 mm lesion/implant along the inferior right hepatic lobe, possibly new or possibly related to the prior peritoneal implant along the right paracolic gutter.  4 x 6 mm subpleural  nodule right lower lobe not clearly evident on the prior study.  Near  complete resolution of prior heterogeneous enhancement involving the bilateral kidneys which likely reflected pyelonephritis.  Near complete resolution of prior lymphadenopathy along with small bowel mesentery.  Mild left inguinal lymphadenopathy measuring up to 14 mm, previously 15 mm.  Lytic metastasis at L3 with associated soft tissue metastasis involving the left psoas muscle improved.   3. Ongoing tobacco and alcohol use  4. Mild neutropenia related to chemotherapy 07/10/2017. Neulasta added.  5.Admission January 2019 with an abdominal wall abscess and pubis osteomyelitis, status post debridement and antibiotics  6.Pain secondary to #1. 7.CT 06/16/2019 with nonocclusive thrombus infrarenal IVC. Anticoagulation initiated. 8.  Left patella fracture following a fall 08/20/2019    Disposition: Yolanda Watson appears stable.  She has completed 5 cycles of FOLFIRI.  Recent restaging CT evaluation shows somewhat of a mixed response.  A liver lesion is slightly larger and there is question of a possible new liver lesion.  There has been improvement in abdominopelvic lymphadenopathy.  Back pain has improved significantly on treatment.  Dr. Benay Spice recommends continuing treatment with FOLFIRI, next cycle 09/16/2019.  Yolanda Watson is in agreement with this plan.  She will return for lab and cycle 6 FOLFIRI on 09/16/2019.  We will see her in follow-up prior to cycle 7 FOLFIRI on 09/30/2019.  She will contact the office in the interim with any problems.  Patient seen with Dr. Benay Spice.  CT images reviewed on the computer with Yolanda Watson.  25 minutes were spent face-to-face at today's visit with the majority of that time involved in counseling/coordination of care.    Ned Card ANP/GNP-BC   09/13/2019  10:57 AM  Yolanda Watson appears to the restaging CT images with her.  The overall radiologic pattern is consistent with stable to improved disease.  Her clinical status is much  improved.  The plan is to continue FOLFIRI. Julieanne Manson, MD

## 2019-09-14 ENCOUNTER — Telehealth: Payer: Self-pay | Admitting: Oncology

## 2019-09-14 NOTE — Telephone Encounter (Signed)
Called and spoke with patient. Confirmed appt  °

## 2019-09-15 ENCOUNTER — Ambulatory Visit: Payer: Medicare Other | Admitting: Physician Assistant

## 2019-09-16 ENCOUNTER — Inpatient Hospital Stay: Payer: Medicare Other

## 2019-09-16 ENCOUNTER — Other Ambulatory Visit: Payer: Self-pay

## 2019-09-16 VITALS — BP 112/74 | HR 81 | Temp 98.7°F | Resp 18 | Wt 106.5 lb

## 2019-09-16 DIAGNOSIS — C778 Secondary and unspecified malignant neoplasm of lymph nodes of multiple regions: Secondary | ICD-10-CM | POA: Diagnosis not present

## 2019-09-16 DIAGNOSIS — R911 Solitary pulmonary nodule: Secondary | ICD-10-CM | POA: Diagnosis not present

## 2019-09-16 DIAGNOSIS — C189 Malignant neoplasm of colon, unspecified: Secondary | ICD-10-CM

## 2019-09-16 DIAGNOSIS — Z95828 Presence of other vascular implants and grafts: Secondary | ICD-10-CM

## 2019-09-16 DIAGNOSIS — C787 Secondary malignant neoplasm of liver and intrahepatic bile duct: Secondary | ICD-10-CM

## 2019-09-16 DIAGNOSIS — C786 Secondary malignant neoplasm of retroperitoneum and peritoneum: Secondary | ICD-10-CM | POA: Diagnosis not present

## 2019-09-16 DIAGNOSIS — Z5111 Encounter for antineoplastic chemotherapy: Secondary | ICD-10-CM | POA: Diagnosis not present

## 2019-09-16 DIAGNOSIS — C7989 Secondary malignant neoplasm of other specified sites: Secondary | ICD-10-CM | POA: Diagnosis not present

## 2019-09-16 LAB — CBC WITH DIFFERENTIAL (CANCER CENTER ONLY)
Abs Immature Granulocytes: 0.03 10*3/uL (ref 0.00–0.07)
Basophils Absolute: 0.1 10*3/uL (ref 0.0–0.1)
Basophils Relative: 1 %
Eosinophils Absolute: 0.2 10*3/uL (ref 0.0–0.5)
Eosinophils Relative: 3 %
HCT: 31.8 % — ABNORMAL LOW (ref 36.0–46.0)
Hemoglobin: 10.1 g/dL — ABNORMAL LOW (ref 12.0–15.0)
Immature Granulocytes: 0 %
Lymphocytes Relative: 19 %
Lymphs Abs: 1.5 10*3/uL (ref 0.7–4.0)
MCH: 32 pg (ref 26.0–34.0)
MCHC: 31.8 g/dL (ref 30.0–36.0)
MCV: 100.6 fL — ABNORMAL HIGH (ref 80.0–100.0)
Monocytes Absolute: 0.5 10*3/uL (ref 0.1–1.0)
Monocytes Relative: 6 %
Neutro Abs: 5.4 10*3/uL (ref 1.7–7.7)
Neutrophils Relative %: 71 %
Platelet Count: 166 10*3/uL (ref 150–400)
RBC: 3.16 MIL/uL — ABNORMAL LOW (ref 3.87–5.11)
RDW: 16.9 % — ABNORMAL HIGH (ref 11.5–15.5)
WBC Count: 7.6 10*3/uL (ref 4.0–10.5)
nRBC: 0 % (ref 0.0–0.2)

## 2019-09-16 LAB — CMP (CANCER CENTER ONLY)
ALT: 15 U/L (ref 0–44)
AST: 17 U/L (ref 15–41)
Albumin: 3.4 g/dL — ABNORMAL LOW (ref 3.5–5.0)
Alkaline Phosphatase: 152 U/L — ABNORMAL HIGH (ref 38–126)
Anion gap: 8 (ref 5–15)
BUN: 21 mg/dL (ref 8–23)
CO2: 26 mmol/L (ref 22–32)
Calcium: 8.5 mg/dL — ABNORMAL LOW (ref 8.9–10.3)
Chloride: 105 mmol/L (ref 98–111)
Creatinine: 0.89 mg/dL (ref 0.44–1.00)
GFR, Est AFR Am: >60 mL/min (ref >60)
GFR, Estimated: >60 mL/min (ref >60)
Glucose, Bld: 104 mg/dL — ABNORMAL HIGH (ref 70–99)
Potassium: 4.2 mmol/L (ref 3.5–5.1)
Sodium: 139 mmol/L (ref 135–145)
Total Bilirubin: 0.3 mg/dL (ref 0.3–1.2)
Total Protein: 6.3 g/dL — ABNORMAL LOW (ref 6.5–8.1)

## 2019-09-16 MED ORDER — SODIUM CHLORIDE 0.9 % IV SOLN
2400.0000 mg/m2 | INTRAVENOUS | Status: DC
  Administered 2019-09-16: 12:00:00 3550 mg via INTRAVENOUS
  Filled 2019-09-16: qty 71

## 2019-09-16 MED ORDER — LEUCOVORIN CALCIUM INJECTION 350 MG
400.0000 mg/m2 | Freq: Once | INTRAVENOUS | Status: AC
  Administered 2019-09-16: 10:00:00 588 mg via INTRAVENOUS
  Filled 2019-09-16: qty 29.4

## 2019-09-16 MED ORDER — SODIUM CHLORIDE 0.9 % IV SOLN
Freq: Once | INTRAVENOUS | Status: AC
  Administered 2019-09-16: 09:00:00 via INTRAVENOUS
  Filled 2019-09-16: qty 250

## 2019-09-16 MED ORDER — PALONOSETRON HCL INJECTION 0.25 MG/5ML
0.2500 mg | Freq: Once | INTRAVENOUS | Status: AC
  Administered 2019-09-16: 0.25 mg via INTRAVENOUS

## 2019-09-16 MED ORDER — ATROPINE SULFATE 0.4 MG/ML IJ SOLN
0.4000 mg | Freq: Once | INTRAMUSCULAR | Status: AC | PRN
  Administered 2019-09-16: 0.4 mg via INTRAVENOUS

## 2019-09-16 MED ORDER — FLUOROURACIL CHEMO INJECTION 2.5 GM/50ML
400.0000 mg/m2 | Freq: Once | INTRAVENOUS | Status: AC
  Administered 2019-09-16: 11:00:00 600 mg via INTRAVENOUS
  Filled 2019-09-16: qty 12

## 2019-09-16 MED ORDER — DEXAMETHASONE SODIUM PHOSPHATE 10 MG/ML IJ SOLN
10.0000 mg | Freq: Once | INTRAMUSCULAR | Status: AC
  Administered 2019-09-16: 10 mg via INTRAVENOUS

## 2019-09-16 MED ORDER — DEXAMETHASONE SODIUM PHOSPHATE 10 MG/ML IJ SOLN
INTRAMUSCULAR | Status: AC
  Filled 2019-09-16: qty 1

## 2019-09-16 MED ORDER — PALONOSETRON HCL INJECTION 0.25 MG/5ML
INTRAVENOUS | Status: AC
  Filled 2019-09-16: qty 5

## 2019-09-16 MED ORDER — SODIUM CHLORIDE 0.9% FLUSH
10.0000 mL | INTRAVENOUS | Status: DC | PRN
  Administered 2019-09-16: 08:00:00 10 mL via INTRAVENOUS
  Filled 2019-09-16: qty 10

## 2019-09-16 MED ORDER — ATROPINE SULFATE 0.4 MG/ML IJ SOLN
INTRAMUSCULAR | Status: AC
  Filled 2019-09-16: qty 1

## 2019-09-16 MED ORDER — IRINOTECAN HCL CHEMO INJECTION 100 MG/5ML
180.0000 mg/m2 | Freq: Once | INTRAVENOUS | Status: AC
  Administered 2019-09-16: 260 mg via INTRAVENOUS
  Filled 2019-09-16: qty 13

## 2019-09-16 NOTE — Patient Instructions (Signed)
   [place] Discharge Instructions for Patients Receiving Chemotherapy  Today you received the following chemotherapy agents: Irinotecan, Leucovorin and Adrucil.   To help prevent nausea and vomiting after your treatment, we encourage you to take your nausea medication as directed.    If you develop nausea and vomiting that is not controlled by your nausea medication, call the clinic.   BELOW ARE SYMPTOMS THAT SHOULD BE REPORTED IMMEDIATELY:  *FEVER GREATER THAN 100.5 F  *CHILLS WITH OR WITHOUT FEVER  NAUSEA AND VOMITING THAT IS NOT CONTROLLED WITH YOUR NAUSEA MEDICATION  *UNUSUAL SHORTNESS OF BREATH  *UNUSUAL BRUISING OR BLEEDING  TENDERNESS IN MOUTH AND THROAT WITH OR WITHOUT PRESENCE OF ULCERS  *URINARY PROBLEMS  *BOWEL PROBLEMS  UNUSUAL RASH Items with * indicate a potential emergency and should be followed up as soon as possible.  Feel free to call the clinic should you have any questions or concerns. The clinic phone number is [phone].  Please show the [facility] at check-in to the Emergency Department and triage nurse.

## 2019-09-16 NOTE — Progress Notes (Signed)
Spoke w/ Dr. Benay Spice, [name] is to be given on pump d/c w/ this cycle. It was held last time d/t elevated ANC.   Demetrius Charity, PharmD, [facility] Phone: [phone] 09/16/2019

## 2019-09-18 ENCOUNTER — Other Ambulatory Visit: Payer: Self-pay

## 2019-09-18 ENCOUNTER — Inpatient Hospital Stay: Payer: Medicare Other

## 2019-09-18 VITALS — BP 109/48 | HR 79 | Temp 98.7°F | Resp 17

## 2019-09-18 DIAGNOSIS — C189 Malignant neoplasm of colon, unspecified: Secondary | ICD-10-CM

## 2019-09-18 DIAGNOSIS — Z5111 Encounter for antineoplastic chemotherapy: Secondary | ICD-10-CM | POA: Diagnosis not present

## 2019-09-18 DIAGNOSIS — R911 Solitary pulmonary nodule: Secondary | ICD-10-CM | POA: Diagnosis not present

## 2019-09-18 DIAGNOSIS — C778 Secondary and unspecified malignant neoplasm of lymph nodes of multiple regions: Secondary | ICD-10-CM | POA: Diagnosis not present

## 2019-09-18 DIAGNOSIS — C787 Secondary malignant neoplasm of liver and intrahepatic bile duct: Secondary | ICD-10-CM

## 2019-09-18 DIAGNOSIS — C786 Secondary malignant neoplasm of retroperitoneum and peritoneum: Secondary | ICD-10-CM | POA: Diagnosis not present

## 2019-09-18 DIAGNOSIS — C7989 Secondary malignant neoplasm of other specified sites: Secondary | ICD-10-CM | POA: Diagnosis not present

## 2019-09-18 MED ORDER — SODIUM CHLORIDE 0.9% FLUSH
10.0000 mL | INTRAVENOUS | Status: DC | PRN
  Administered 2019-09-18: 10:00:00 10 mL
  Filled 2019-09-18: qty 10

## 2019-09-18 MED ORDER — HEPARIN SOD (PORK) LOCK FLUSH 100 UNIT/ML IV SOLN
500.0000 [IU] | Freq: Once | INTRAVENOUS | Status: AC | PRN
  Administered 2019-09-18: 500 [IU]
  Filled 2019-09-18: qty 5

## 2019-09-18 MED ORDER — PEGFILGRASTIM-CBQV 6 MG/0.6ML ~~LOC~~ SOSY
6.0000 mg | PREFILLED_SYRINGE | Freq: Once | SUBCUTANEOUS | Status: AC
  Administered 2019-09-18: 10:00:00 6 mg via SUBCUTANEOUS

## 2019-09-18 NOTE — Patient Instructions (Signed)

## 2019-09-24 ENCOUNTER — Other Ambulatory Visit: Payer: Self-pay | Admitting: Oncology

## 2019-09-30 ENCOUNTER — Inpatient Hospital Stay: Payer: Medicare Other | Attending: Oncology | Admitting: Nurse Practitioner

## 2019-09-30 ENCOUNTER — Other Ambulatory Visit: Payer: Self-pay

## 2019-09-30 ENCOUNTER — Other Ambulatory Visit: Payer: Self-pay | Admitting: Nurse Practitioner

## 2019-09-30 ENCOUNTER — Encounter: Payer: Self-pay | Admitting: Nurse Practitioner

## 2019-09-30 ENCOUNTER — Inpatient Hospital Stay: Payer: Medicare Other

## 2019-09-30 DIAGNOSIS — C787 Secondary malignant neoplasm of liver and intrahepatic bile duct: Secondary | ICD-10-CM | POA: Diagnosis not present

## 2019-09-30 DIAGNOSIS — N2889 Other specified disorders of kidney and ureter: Secondary | ICD-10-CM | POA: Insufficient documentation

## 2019-09-30 DIAGNOSIS — C189 Malignant neoplasm of colon, unspecified: Secondary | ICD-10-CM

## 2019-09-30 DIAGNOSIS — C786 Secondary malignant neoplasm of retroperitoneum and peritoneum: Secondary | ICD-10-CM | POA: Insufficient documentation

## 2019-09-30 DIAGNOSIS — C778 Secondary and unspecified malignant neoplasm of lymph nodes of multiple regions: Secondary | ICD-10-CM | POA: Diagnosis not present

## 2019-09-30 DIAGNOSIS — Z95828 Presence of other vascular implants and grafts: Secondary | ICD-10-CM

## 2019-09-30 DIAGNOSIS — Z5111 Encounter for antineoplastic chemotherapy: Secondary | ICD-10-CM | POA: Insufficient documentation

## 2019-09-30 DIAGNOSIS — R590 Localized enlarged lymph nodes: Secondary | ICD-10-CM | POA: Insufficient documentation

## 2019-09-30 DIAGNOSIS — R911 Solitary pulmonary nodule: Secondary | ICD-10-CM | POA: Diagnosis not present

## 2019-09-30 DIAGNOSIS — C7989 Secondary malignant neoplasm of other specified sites: Secondary | ICD-10-CM | POA: Insufficient documentation

## 2019-09-30 DIAGNOSIS — Z923 Personal history of irradiation: Secondary | ICD-10-CM | POA: Diagnosis not present

## 2019-09-30 DIAGNOSIS — Z79899 Other long term (current) drug therapy: Secondary | ICD-10-CM | POA: Diagnosis not present

## 2019-09-30 LAB — CMP (CANCER CENTER ONLY)
ALT: 13 U/L (ref 0–44)
AST: 12 U/L — ABNORMAL LOW (ref 15–41)
Albumin: 3.7 g/dL (ref 3.5–5.0)
Alkaline Phosphatase: 160 U/L — ABNORMAL HIGH (ref 38–126)
Anion gap: 10 (ref 5–15)
BUN: 11 mg/dL (ref 8–23)
CO2: 21 mmol/L — ABNORMAL LOW (ref 22–32)
Calcium: 8.7 mg/dL — ABNORMAL LOW (ref 8.9–10.3)
Chloride: 109 mmol/L (ref 98–111)
Creatinine: 0.66 mg/dL (ref 0.44–1.00)
GFR, Est AFR Am: >60 mL/min (ref >60)
GFR, Estimated: >60 mL/min (ref >60)
Glucose, Bld: 96 mg/dL (ref 70–99)
Potassium: 3.5 mmol/L (ref 3.5–5.1)
Sodium: 140 mmol/L (ref 135–145)
Total Bilirubin: 0.3 mg/dL (ref 0.3–1.2)
Total Protein: 6.5 g/dL (ref 6.5–8.1)

## 2019-09-30 LAB — CBC WITH DIFFERENTIAL (CANCER CENTER ONLY)
Abs Immature Granulocytes: 0.26 10*3/uL — ABNORMAL HIGH (ref 0.00–0.07)
Basophils Absolute: 0.1 10*3/uL (ref 0.0–0.1)
Basophils Relative: 0 %
Eosinophils Absolute: 0.2 10*3/uL (ref 0.0–0.5)
Eosinophils Relative: 1 %
HCT: 33.4 % — ABNORMAL LOW (ref 36.0–46.0)
Hemoglobin: 10.5 g/dL — ABNORMAL LOW (ref 12.0–15.0)
Immature Granulocytes: 1 %
Lymphocytes Relative: 7 %
Lymphs Abs: 1.4 10*3/uL (ref 0.7–4.0)
MCH: 33 pg (ref 26.0–34.0)
MCHC: 31.4 g/dL (ref 30.0–36.0)
MCV: 105 fL — ABNORMAL HIGH (ref 80.0–100.0)
Monocytes Absolute: 0.7 10*3/uL (ref 0.1–1.0)
Monocytes Relative: 4 %
Neutro Abs: 16.4 10*3/uL — ABNORMAL HIGH (ref 1.7–7.7)
Neutrophils Relative %: 87 %
Platelet Count: 124 10*3/uL — ABNORMAL LOW (ref 150–400)
RBC: 3.18 MIL/uL — ABNORMAL LOW (ref 3.87–5.11)
RDW: 17.8 % — ABNORMAL HIGH (ref 11.5–15.5)
WBC Count: 19.1 10*3/uL — ABNORMAL HIGH (ref 4.0–10.5)
nRBC: 0 % (ref 0.0–0.2)

## 2019-09-30 LAB — CEA (IN HOUSE-CHCC): CEA (CHCC-In House): 8.66 ng/mL — ABNORMAL HIGH (ref 0.00–5.00)

## 2019-09-30 MED ORDER — DEXAMETHASONE SODIUM PHOSPHATE 10 MG/ML IJ SOLN
INTRAMUSCULAR | Status: AC
  Filled 2019-09-30: qty 1

## 2019-09-30 MED ORDER — DEXAMETHASONE SODIUM PHOSPHATE 10 MG/ML IJ SOLN
10.0000 mg | Freq: Once | INTRAMUSCULAR | Status: AC
  Administered 2019-09-30: 13:00:00 10 mg via INTRAVENOUS

## 2019-09-30 MED ORDER — LEUCOVORIN CALCIUM INJECTION 350 MG
400.0000 mg/m2 | Freq: Once | INTRAVENOUS | Status: AC
  Administered 2019-09-30: 14:00:00 588 mg via INTRAVENOUS
  Filled 2019-09-30: qty 29.4

## 2019-09-30 MED ORDER — SODIUM CHLORIDE 0.9 % IV SOLN
2400.0000 mg/m2 | INTRAVENOUS | Status: DC
  Administered 2019-09-30: 3550 mg via INTRAVENOUS
  Filled 2019-09-30: qty 71

## 2019-09-30 MED ORDER — OXYCODONE HCL 5 MG PO TABS: 5.0000 mg | ORAL_TABLET | Freq: Four times a day (QID) | ORAL | 0 refills | Status: DC | PRN

## 2019-09-30 MED ORDER — PALONOSETRON HCL INJECTION 0.25 MG/5ML
0.2500 mg | Freq: Once | INTRAVENOUS | Status: AC
  Administered 2019-09-30: 13:00:00 0.25 mg via INTRAVENOUS

## 2019-09-30 MED ORDER — IRINOTECAN HCL CHEMO INJECTION 100 MG/5ML
180.0000 mg/m2 | Freq: Once | INTRAVENOUS | Status: AC
  Administered 2019-09-30: 14:00:00 260 mg via INTRAVENOUS
  Filled 2019-09-30: qty 13

## 2019-09-30 MED ORDER — SODIUM CHLORIDE 0.9 % IV SOLN
Freq: Once | INTRAVENOUS | Status: AC
  Administered 2019-09-30: 13:00:00 via INTRAVENOUS
  Filled 2019-09-30: qty 250

## 2019-09-30 MED ORDER — SODIUM CHLORIDE 0.9 % IV SOLN
Freq: Once | INTRAVENOUS | Status: AC
  Administered 2019-09-30: 14:00:00 via INTRAVENOUS
  Filled 2019-09-30: qty 250

## 2019-09-30 MED ORDER — PALONOSETRON HCL INJECTION 0.25 MG/5ML
INTRAVENOUS | Status: AC
  Filled 2019-09-30: qty 5

## 2019-09-30 MED ORDER — ATROPINE SULFATE 0.4 MG/ML IJ SOLN: 0.4000 mg | Freq: Once | INTRAMUSCULAR | Status: DC | PRN

## 2019-09-30 MED ORDER — SODIUM CHLORIDE 0.9% FLUSH
10.0000 mL | INTRAVENOUS | Status: DC | PRN
  Administered 2019-09-30: 10 mL via INTRAVENOUS
  Filled 2019-09-30: qty 10

## 2019-09-30 MED ORDER — FLUOROURACIL CHEMO INJECTION 2.5 GM/50ML
400.0000 mg/m2 | Freq: Once | INTRAVENOUS | Status: AC
  Administered 2019-09-30: 600 mg via INTRAVENOUS
  Filled 2019-09-30: qty 12

## 2019-09-30 NOTE — Progress Notes (Signed)
[place] OFFICE PROGRESS NOTE   Diagnosis: Colon cancer  INTERVAL HISTORY:   [name] returns as scheduled.  She completed cycle 6 FOLFIRI 09/16/2019.  She denies nausea/vomiting.  No mouth sores.  No diarrhea.  Left knee pain continues to improve.  The pain mainly bothers her at nighttime.  At present she describes "achy" discomfort.  She takes oxycodone as needed.  No bleeding.  She has a good appetite.  Objective:  Vital signs in last 24 hours:  Blood pressure 102/63, pulse 76, temperature 98.3 F (36.8 C), temperature source Temporal, resp. rate 16, height 5' 6.5" (1.689 m), weight 101 lb 14.4 oz (46.2 kg), SpO2 100 %.    HEENT: No thrush or ulcers. Lymphatics: Approximate half centimeter firm left inguinal lymph node. GI: Abdomen soft and nontender.  No hepatomegaly. Vascular: No leg edema. Skin: Palms without erythema. Port-A-Cath without erythema.   Lab Results:  Lab Results  Component Value Date   WBC 19.1 (H) 09/30/2019   HGB 10.5 (L) 09/30/2019   HCT 33.4 (L) 09/30/2019   MCV 105.0 (H) 09/30/2019   PLT 124 (L) 09/30/2019   NEUTROABS 16.4 (H) 09/30/2019    Imaging:  No results found.  Medications: I have reviewed the patient's current medications.  Assessment/Plan: 1. Metastatic colon cancer presenting with an Abdominal/pelvic mass  2. Colon cancerdiagnosed in [place] in 2016-pathology report dated 02/15/2015-invasive adenocarcinoma, well-differentiated with prominent mucinous features; microscopic tumor extension into mesenteric adipose tissue and focally involving visceral peritoneum; radial and mucosal margins negative; lymph-vascular invasion withsuspicious foci identified;perineural invasion not identified; 16 negative lymph nodes; tumor deposits not identified; pT4apN0;MSI stable  ? CT 04/22/2017 revealed a heterogenous anterior pelvic wall mass, right inguinal mass, top normal sized retroperitoneal and iliac nodes, abutment  of the anterior dome of the bladder and a loop of small bowel ? 04/29/2017 biopsy of anterior abdominal/pelvic wall mass-acellular mucin dissecting fibrous stroma ? Cycle 1 FOLFOX 05/15/2017 ? Cycle 2 FOLFOX 05/28/2017 ? Cycle 3 FOLFOX with Avastin on 06/11/2017 ? Cycle 4 FOLFOX/Avastin 06/25/2017 ? Cycle 5 FOLFOX/Avastin 07/10/2017, Neulasta added ? Restaging CT 07/21/2017-mild decrease in the size of a right inguinal node and the anterior abdominal wall mass ? Radiation to the abdominal wall mass 08/18/2017-09/03/2017 ? Status post excision of abdominal wall mass 10/03/2017--pathology showed adenocarcinoma with excessive extracellular mucin. Margins negative for tumor. Right inguinal wall mass also excised showing metastatic adenocarcinoma with excessive extracellular mucin in 1 of 8 lymph nodes. ? CT abdomen/pelvis 06/16/2019-new right hepatic lobe lesion measuring 2.7 x 1.7 cm. Hypoattenuation within the pancreatic uncinate process measuring 1.5 x 1.6 cm. Mild left adrenal thickening. Upper pole left renal mass 3.0 x 3.6 cm. Infiltrative hypoenhancement involving the lower pole left kidney. Nonocclusive thrombus within the infrarenal IVC. Necrotic adenopathy within the small bowel mesentery, new. New necrotic left inguinal nodes. Lytic lesion within and adjacent the anterior side of the L3 vertebral body. Contiguous mass within the left psoas measuring 4.6 x 3.4 cm. ? Cycle 1 FOLFIRI 06/23/2019 ? Cycle 2 FOLFIRI 07/08/2019, Udenyca added ? Cycle 3 FOLFIRI 07/22/2019 ? Cycle 4 FOLFIRI 08/04/2019 ? Cycle 5 FOLFIRI 08/18/2019 ? CTs 09/08/2019- 3.4 x 2.9 cm metastasis in segment 7 liver, previously 2.7 x 1.7 cm.  14 mm lesion/implant along the inferior right hepatic lobe, possibly new or possibly related to the prior peritoneal implant along the right paracolic gutter.  4 x 6 mm subpleural nodule right lower lobe not clearly evident on the prior study.  Near  complete resolution of prior  heterogeneous enhancement involving the bilateral kidneys which likely reflected pyelonephritis.  Near complete resolution of prior lymphadenopathy along with small bowel mesentery.  Mild left inguinal lymphadenopathy measuring up to 14 mm, previously 15 mm.  Lytic metastasis at L3 with associated soft tissue metastasis involving the left psoas muscle improved. ? Cycle 6 FOLFIRI 09/16/2019, Udenyca ? Cycle 7 FOLFIRI 09/30/2019   3. Ongoing tobacco and alcohol use  4. Mild neutropenia related to chemotherapy 07/10/2017. Neulasta added.  5.Admission January 2019 with an abdominal wall abscess and pubis osteomyelitis, status post debridement and antibiotics  6.Pain secondary to #1. 7.CT 06/16/2019 with nonocclusive thrombus infrarenal IVC. Anticoagulation initiated. 8.  Left patella fracture following a fall 08/20/2019    Disposition: Yolanda Watson appears stable.  She has completed 6 cycles of FOLFIRI.  Plan to proceed with cycle 7 today as scheduled.  We reviewed the CBC from today.  Counts adequate to proceed with treatment.    She will return for lab, follow-up, cycle 8 FOLFIRI in 2 weeks.  She will contact the office in the interim with any problems.    Ned Card ANP/GNP-BC   09/30/2019  12:25 PM

## 2019-09-30 NOTE — Patient Instructions (Signed)
[facility] Discharge Instructions for Patients Receiving Chemotherapy  Today you received the following chemotherapy agents: Irinotecan/Leucovorin/5FU  To help prevent nausea and vomiting after your treatment, we encourage you to take your nausea medication as directed.   If you develop nausea and vomiting that is not controlled by your nausea medication, call the clinic.   BELOW ARE SYMPTOMS THAT SHOULD BE REPORTED IMMEDIATELY:  *FEVER GREATER THAN 100.5 F  *CHILLS WITH OR WITHOUT FEVER  NAUSEA AND VOMITING THAT IS NOT CONTROLLED WITH YOUR NAUSEA MEDICATION  *UNUSUAL SHORTNESS OF BREATH  *UNUSUAL BRUISING OR BLEEDING  TENDERNESS IN MOUTH AND THROAT WITH OR WITHOUT PRESENCE OF ULCERS  *URINARY PROBLEMS  *BOWEL PROBLEMS  UNUSUAL RASH Items with * indicate a potential emergency and should be followed up as soon as possible.  Feel free to call the clinic should you have any questions or concerns. The clinic phone number is [phone].  Please show the CHEMO ALERT CARD at check-in to the Emergency Department and triage nurse.   

## 2019-10-01 ENCOUNTER — Telehealth: Payer: Self-pay | Admitting: Oncology

## 2019-10-01 NOTE — Telephone Encounter (Signed)
Called and spoke with patient. Confirmed appt  °

## 2019-10-02 ENCOUNTER — Inpatient Hospital Stay: Payer: Medicare Other

## 2019-10-02 VITALS — BP 125/66 | HR 72 | Temp 99.1°F | Resp 18

## 2019-10-02 DIAGNOSIS — C189 Malignant neoplasm of colon, unspecified: Secondary | ICD-10-CM | POA: Diagnosis not present

## 2019-10-02 DIAGNOSIS — C786 Secondary malignant neoplasm of retroperitoneum and peritoneum: Secondary | ICD-10-CM | POA: Diagnosis not present

## 2019-10-02 DIAGNOSIS — R911 Solitary pulmonary nodule: Secondary | ICD-10-CM | POA: Diagnosis not present

## 2019-10-02 DIAGNOSIS — C7989 Secondary malignant neoplasm of other specified sites: Secondary | ICD-10-CM | POA: Diagnosis not present

## 2019-10-02 DIAGNOSIS — C778 Secondary and unspecified malignant neoplasm of lymph nodes of multiple regions: Secondary | ICD-10-CM | POA: Diagnosis not present

## 2019-10-02 DIAGNOSIS — Z5111 Encounter for antineoplastic chemotherapy: Secondary | ICD-10-CM | POA: Diagnosis not present

## 2019-10-02 MED ORDER — HEPARIN SOD (PORK) LOCK FLUSH 100 UNIT/ML IV SOLN
500.0000 [IU] | Freq: Once | INTRAVENOUS | Status: AC | PRN
  Administered 2019-10-02: 14:00:00 500 [IU]
  Filled 2019-10-02: qty 5

## 2019-10-02 MED ORDER — PEGFILGRASTIM-CBQV 6 MG/0.6ML ~~LOC~~ SOSY: 6.0000 mg | PREFILLED_SYRINGE | Freq: Once | SUBCUTANEOUS | Status: DC

## 2019-10-02 MED ORDER — SODIUM CHLORIDE 0.9% FLUSH
10.0000 mL | INTRAVENOUS | Status: DC | PRN
  Administered 2019-10-02: 10 mL
  Filled 2019-10-02: qty 10

## 2019-10-02 NOTE — Progress Notes (Signed)
Pt. States that during her last provider visit with Lattie Haw, on 09/30/19, she was told that due to her recent counts, she did NOT need to receive Udenyca on 10/02/19. Although this information was not found in the office note, Ms. Bartnik refused [name] today.

## 2019-10-10 ENCOUNTER — Other Ambulatory Visit: Payer: Self-pay | Admitting: Oncology

## 2019-10-14 ENCOUNTER — Telehealth: Payer: Self-pay | Admitting: Oncology

## 2019-10-14 ENCOUNTER — Inpatient Hospital Stay: Payer: Medicare Other

## 2019-10-14 ENCOUNTER — Inpatient Hospital Stay (HOSPITAL_BASED_OUTPATIENT_CLINIC_OR_DEPARTMENT_OTHER): Payer: Medicare Other | Admitting: Nurse Practitioner

## 2019-10-14 ENCOUNTER — Other Ambulatory Visit: Payer: Self-pay

## 2019-10-14 ENCOUNTER — Encounter: Payer: Self-pay | Admitting: Nurse Practitioner

## 2019-10-14 VITALS — BP 128/68 | HR 73 | Temp 98.2°F | Resp 17 | Ht 66.0 in | Wt 102.4 lb

## 2019-10-14 DIAGNOSIS — Z95828 Presence of other vascular implants and grafts: Secondary | ICD-10-CM

## 2019-10-14 DIAGNOSIS — C778 Secondary and unspecified malignant neoplasm of lymph nodes of multiple regions: Secondary | ICD-10-CM | POA: Diagnosis not present

## 2019-10-14 DIAGNOSIS — C787 Secondary malignant neoplasm of liver and intrahepatic bile duct: Secondary | ICD-10-CM

## 2019-10-14 DIAGNOSIS — C7989 Secondary malignant neoplasm of other specified sites: Secondary | ICD-10-CM | POA: Diagnosis not present

## 2019-10-14 DIAGNOSIS — Z5111 Encounter for antineoplastic chemotherapy: Secondary | ICD-10-CM | POA: Diagnosis not present

## 2019-10-14 DIAGNOSIS — C786 Secondary malignant neoplasm of retroperitoneum and peritoneum: Secondary | ICD-10-CM | POA: Diagnosis not present

## 2019-10-14 DIAGNOSIS — C189 Malignant neoplasm of colon, unspecified: Secondary | ICD-10-CM

## 2019-10-14 DIAGNOSIS — R911 Solitary pulmonary nodule: Secondary | ICD-10-CM | POA: Diagnosis not present

## 2019-10-14 LAB — CBC WITH DIFFERENTIAL (CANCER CENTER ONLY)
Basophils Absolute: 0 10*3/uL (ref 0.0–0.1)
Basophils Relative: 1 %
Eosinophils Absolute: 0.1 10*3/uL (ref 0.0–0.5)
Eosinophils Relative: 6 %
HCT: 47.5 % — ABNORMAL HIGH (ref 36.0–46.0)
Hemoglobin: 15.7 g/dL — ABNORMAL HIGH (ref 12.0–15.0)
Lymphocytes Relative: 29 %
Lymphs Abs: 0.6 10*3/uL — ABNORMAL LOW (ref 0.7–4.0)
MCH: 33.1 pg (ref 26.0–34.0)
MCHC: 33.1 g/dL (ref 30.0–36.0)
MCV: 100.2 fL — ABNORMAL HIGH (ref 80.0–100.0)
Monocytes Absolute: 0.1 10*3/uL (ref 0.1–1.0)
Monocytes Relative: 7 %
Neutro Abs: 1.1 10*3/uL — ABNORMAL LOW (ref 1.7–7.7)
Neutrophils Relative %: 57 %
Platelet Count: 87 10*3/uL — ABNORMAL LOW (ref 150–400)
RBC: 4.74 MIL/uL (ref 3.87–5.11)
RDW: 15.9 % — ABNORMAL HIGH (ref 11.5–15.5)
WBC Count: 1.9 10*3/uL — ABNORMAL LOW (ref 4.0–10.5)
nRBC: 0 % (ref 0.0–0.2)
nRBC: 0 /100 WBC

## 2019-10-14 LAB — CMP (CANCER CENTER ONLY)
ALT: 15 U/L (ref 0–44)
AST: 15 U/L (ref 15–41)
Albumin: 3.7 g/dL (ref 3.5–5.0)
Alkaline Phosphatase: 114 U/L (ref 38–126)
Anion gap: 10 (ref 5–15)
BUN: 16 mg/dL (ref 8–23)
CO2: 22 mmol/L (ref 22–32)
Calcium: 8.4 mg/dL — ABNORMAL LOW (ref 8.9–10.3)
Chloride: 109 mmol/L (ref 98–111)
Creatinine: 0.75 mg/dL (ref 0.44–1.00)
GFR, Est AFR Am: >60 mL/min (ref >60)
GFR, Estimated: >60 mL/min (ref >60)
Glucose, Bld: 96 mg/dL (ref 70–99)
Potassium: 3.6 mmol/L (ref 3.5–5.1)
Sodium: 141 mmol/L (ref 135–145)
Total Bilirubin: 0.3 mg/dL (ref 0.3–1.2)
Total Protein: 6.4 g/dL — ABNORMAL LOW (ref 6.5–8.1)

## 2019-10-14 LAB — CEA (IN HOUSE-CHCC): CEA (CHCC-In House): 6.97 ng/mL — ABNORMAL HIGH (ref 0.00–5.00)

## 2019-10-14 MED ORDER — HEPARIN SOD (PORK) LOCK FLUSH 100 UNIT/ML IV SOLN
500.0000 [IU] | Freq: Once | INTRAVENOUS | Status: AC | PRN
  Administered 2019-10-14: 500 [IU] via INTRAVENOUS
  Filled 2019-10-14: qty 5

## 2019-10-14 MED ORDER — SODIUM CHLORIDE 0.9% FLUSH
10.0000 mL | INTRAVENOUS | Status: DC | PRN
  Administered 2019-10-14: 09:00:00 10 mL via INTRAVENOUS
  Filled 2019-10-14: qty 10

## 2019-10-14 NOTE — Progress Notes (Signed)
[place] OFFICE PROGRESS NOTE   Diagnosis: Colon cancer  INTERVAL HISTORY:   Yolanda Watson returns as scheduled.  She completed cycle 7 FOLFIRI 09/30/2019.  She declined Udenyca.  She denies nausea/vomiting.  No mouth sores.  No diarrhea.  No hand or foot pain or redness.  She notes in general her skin is very dry.  No fever, cough, shortness of breath.  No bleeding.  She is becoming more fatigued.  She is considering discontinuing treatment.  Objective:  Vital signs in last 24 hours:  Blood pressure 128/68, pulse 73, temperature 98.2 F (36.8 C), temperature source Temporal, resp. rate 17, height _0  (1.676 m), weight 102 lb 6.4 oz (46.4 kg), SpO2 100 %.    HEENT: No thrush or ulcers. Lymphatics: 1/2 to 1 cm firm left inguinal lymph node. GI: Abdomen soft and nontender.  No hepatomegaly.  Vascular: No leg edema. Skin: Palms without erythema.  Skin is dry appearing. Port-A-Cath without erythema.   Lab Results:  Lab Results  Component Value Date   WBC 19.1 (H) 09/30/2019   HGB 10.5 (L) 09/30/2019   HCT 33.4 (L) 09/30/2019   MCV 105.0 (H) 09/30/2019   PLT 124 (L) 09/30/2019   NEUTROABS 16.4 (H) 09/30/2019    Imaging:  No results found.  Medications: I have reviewed the patient's current medications.  Assessment/Plan: 1. Metastatic colon cancer presenting with an Abdominal/pelvic mass  2. Colon cancerdiagnosed in [place] in 2016-pathology report dated 02/15/2015-invasive adenocarcinoma, well-differentiated with prominent mucinous features; microscopic tumor extension into mesenteric adipose tissue and focally involving visceral peritoneum; radial and mucosal margins negative; lymph-vascular invasion withsuspicious foci identified;perineural invasion not identified; 16 negative lymph nodes; tumor deposits not identified; pT4apN0;MSI stable  ? CT 04/22/2017 revealed a heterogenous anterior pelvic wall mass, right inguinal mass, top normal sized  retroperitoneal and iliac nodes, abutment of the anterior dome of the bladder and a loop of small bowel ? 04/29/2017 biopsy of anterior abdominal/pelvic wall mass-acellular mucin dissecting fibrous stroma ? Cycle 1 FOLFOX 05/15/2017 ? Cycle 2 FOLFOX 05/28/2017 ? Cycle 3 FOLFOX with Avastin on 06/11/2017 ? Cycle 4 FOLFOX/Avastin 06/25/2017 ? Cycle 5 FOLFOX/Avastin 07/10/2017, Neulasta added ? Restaging CT 07/21/2017-mild decrease in the size of a right inguinal node and the anterior abdominal wall mass ? Radiation to the abdominal wall mass 08/18/2017-09/03/2017 ? Status post excision of abdominal wall mass 10/03/2017--pathology showed adenocarcinoma with excessive extracellular mucin. Margins negative for tumor. Right inguinal wall mass also excised showing metastatic adenocarcinoma with excessive extracellular mucin in 1 of 8 lymph nodes. ? CT abdomen/pelvis 06/16/2019-new right hepatic lobe lesion measuring 2.7 x 1.7 cm. Hypoattenuation within the pancreatic uncinate process measuring 1.5 x 1.6 cm. Mild left adrenal thickening. Upper pole left renal mass 3.0 x 3.6 cm. Infiltrative hypoenhancement involving the lower pole left kidney. Nonocclusive thrombus within the infrarenal IVC. Necrotic adenopathy within the small bowel mesentery, new. New necrotic left inguinal nodes. Lytic lesion within and adjacent the anterior side of the L3 vertebral body. Contiguous mass within the left psoas measuring 4.6 x 3.4 cm. ? Cycle 1 FOLFIRI 06/23/2019 ? Cycle 2 FOLFIRI 07/08/2019, Udenyca added ? Cycle 3 FOLFIRI 07/22/2019 ? Cycle 4 FOLFIRI 08/04/2019 ? Cycle 5 FOLFIRI 08/18/2019 ? CTs 09/08/2019-3.4 x 2.9 cm metastasis in segment 7 liver, previously 2.7 x 1.7 cm. 14 mm lesion/implant along the inferior right hepatic lobe, possibly new or possibly related to the prior peritoneal implant along the right paracolic gutter. 4 x 6 mm subpleural nodule right  lower lobe not clearly evident on the prior study. Near  complete resolution of prior heterogeneous enhancement involving the bilateral kidneys which likely reflected pyelonephritis. Near complete resolution of prior lymphadenopathy along with small bowel mesentery. Mild left inguinal lymphadenopathy measuring up to 14 mm, previously 15 mm. Lytic metastasis at L3 with associated soft tissue metastasis involving the left psoas muscle improved. ? Cycle 6 FOLFIRI 09/16/2019, Udenyca ? Cycle 7 FOLFIRI 09/30/2019   3. Ongoing tobacco and alcohol use  4. Mild neutropenia related to chemotherapy 07/10/2017. Neulasta added.  5.Admission January 2019 with an abdominal wall abscess and pubis osteomyelitis, status post debridement and antibiotics  6.Pain secondary to #1. 7.CT 06/16/2019 with nonocclusive thrombus infrarenal IVC. Anticoagulation initiated. 8.Left patella fracture following a fall 08/20/2019   Disposition: Yolanda Watson appears stable.  She completed cycle 7 FOLFIRI on 09/30/2019.  She declined Udenyca on the day of pump discontinuation.  We reviewed the CBC from today.  She has mild to moderate neutropenia and thrombocytopenia.  She understands to contact the office with fever, chills, other signs of infection, bleeding.  We decided to hold today's treatment and reschedule for 1 week.  Due to progressive fatigue the treatment schedule will be changed from every 2 weeks to every 3 weeks going forward.  She agrees to a [place] on the day of pump discontinuation in the future.  She will return for lab and cycle 8 FOLFIRI in 1 week.  She will return for lab, follow-up, cycle 9 FOLFIRI on 11/11/2019.  She will contact the office in the interim with any problems.  Plan reviewed with Dr. Benay Spice.      Ned Card ANP/GNP-BC   10/14/2019  9:31 AM

## 2019-10-14 NOTE — Telephone Encounter (Signed)
I talk with patient regarding schedule  

## 2019-10-14 NOTE — Addendum Note (Signed)
Addended by: Owens Shark on: 10/14/2019 10:34 AM   Modules accepted: Orders

## 2019-10-20 ENCOUNTER — Other Ambulatory Visit: Payer: Self-pay

## 2019-10-20 ENCOUNTER — Inpatient Hospital Stay: Payer: Medicare Other

## 2019-10-20 VITALS — BP 137/68 | HR 72 | Temp 98.3°F | Resp 18

## 2019-10-20 DIAGNOSIS — C189 Malignant neoplasm of colon, unspecified: Secondary | ICD-10-CM

## 2019-10-20 DIAGNOSIS — R911 Solitary pulmonary nodule: Secondary | ICD-10-CM | POA: Diagnosis not present

## 2019-10-20 DIAGNOSIS — C778 Secondary and unspecified malignant neoplasm of lymph nodes of multiple regions: Secondary | ICD-10-CM | POA: Diagnosis not present

## 2019-10-20 DIAGNOSIS — Z95828 Presence of other vascular implants and grafts: Secondary | ICD-10-CM

## 2019-10-20 DIAGNOSIS — C787 Secondary malignant neoplasm of liver and intrahepatic bile duct: Secondary | ICD-10-CM

## 2019-10-20 DIAGNOSIS — C7989 Secondary malignant neoplasm of other specified sites: Secondary | ICD-10-CM | POA: Diagnosis not present

## 2019-10-20 DIAGNOSIS — Z5111 Encounter for antineoplastic chemotherapy: Secondary | ICD-10-CM | POA: Diagnosis not present

## 2019-10-20 DIAGNOSIS — C786 Secondary malignant neoplasm of retroperitoneum and peritoneum: Secondary | ICD-10-CM | POA: Diagnosis not present

## 2019-10-20 LAB — CBC WITH DIFFERENTIAL (CANCER CENTER ONLY)
Abs Immature Granulocytes: 0.01 10*3/uL (ref 0.00–0.07)
Basophils Absolute: 0 10*3/uL (ref 0.0–0.1)
Basophils Relative: 1 %
Eosinophils Absolute: 0.2 10*3/uL (ref 0.0–0.5)
Eosinophils Relative: 4 %
HCT: 36.7 % (ref 36.0–46.0)
Hemoglobin: 11.9 g/dL — ABNORMAL LOW (ref 12.0–15.0)
Immature Granulocytes: 0 %
Lymphocytes Relative: 28 %
Lymphs Abs: 1.3 10*3/uL (ref 0.7–4.0)
MCH: 33.1 pg (ref 26.0–34.0)
MCHC: 32.4 g/dL (ref 30.0–36.0)
MCV: 102.2 fL — ABNORMAL HIGH (ref 80.0–100.0)
Monocytes Absolute: 0.5 10*3/uL (ref 0.1–1.0)
Monocytes Relative: 11 %
Neutro Abs: 2.7 10*3/uL (ref 1.7–7.7)
Neutrophils Relative %: 56 %
Platelet Count: 155 10*3/uL (ref 150–400)
RBC: 3.59 MIL/uL — ABNORMAL LOW (ref 3.87–5.11)
RDW: 15.6 % — ABNORMAL HIGH (ref 11.5–15.5)
WBC Count: 4.8 10*3/uL (ref 4.0–10.5)
nRBC: 0 % (ref 0.0–0.2)

## 2019-10-20 LAB — CMP (CANCER CENTER ONLY)
ALT: 20 U/L (ref 0–44)
AST: 19 U/L (ref 15–41)
Albumin: 3.9 g/dL (ref 3.5–5.0)
Alkaline Phosphatase: 106 U/L (ref 38–126)
Anion gap: 11 (ref 5–15)
BUN: 14 mg/dL (ref 8–23)
CO2: 22 mmol/L (ref 22–32)
Calcium: 8.6 mg/dL — ABNORMAL LOW (ref 8.9–10.3)
Chloride: 108 mmol/L (ref 98–111)
Creatinine: 0.72 mg/dL (ref 0.44–1.00)
GFR, Est AFR Am: >60 mL/min (ref >60)
GFR, Estimated: >60 mL/min (ref >60)
Glucose, Bld: 99 mg/dL (ref 70–99)
Potassium: 4.2 mmol/L (ref 3.5–5.1)
Sodium: 141 mmol/L (ref 135–145)
Total Bilirubin: 0.4 mg/dL (ref 0.3–1.2)
Total Protein: 6.7 g/dL (ref 6.5–8.1)

## 2019-10-20 MED ORDER — ATROPINE SULFATE 1 MG/ML IJ SOLN
INTRAMUSCULAR | Status: AC
  Filled 2019-10-20: qty 1

## 2019-10-20 MED ORDER — PALONOSETRON HCL INJECTION 0.25 MG/5ML
0.2500 mg | Freq: Once | INTRAVENOUS | Status: AC
  Administered 2019-10-20: 0.25 mg via INTRAVENOUS

## 2019-10-20 MED ORDER — ATROPINE SULFATE 0.4 MG/ML IJ SOLN
0.4000 mg | Freq: Once | INTRAMUSCULAR | Status: AC | PRN
  Administered 2019-10-20: 0.4 mg via INTRAVENOUS

## 2019-10-20 MED ORDER — IRINOTECAN HCL CHEMO INJECTION 100 MG/5ML
180.0000 mg/m2 | Freq: Once | INTRAVENOUS | Status: AC
  Administered 2019-10-20: 10:00:00 260 mg via INTRAVENOUS
  Filled 2019-10-20: qty 13

## 2019-10-20 MED ORDER — SODIUM CHLORIDE 0.9 % IV SOLN
Freq: Once | INTRAVENOUS | Status: AC
  Administered 2019-10-20: 09:00:00 via INTRAVENOUS
  Filled 2019-10-20: qty 250

## 2019-10-20 MED ORDER — PALONOSETRON HCL INJECTION 0.25 MG/5ML
INTRAVENOUS | Status: AC
  Filled 2019-10-20: qty 5

## 2019-10-20 MED ORDER — SODIUM CHLORIDE 0.9% FLUSH
10.0000 mL | INTRAVENOUS | Status: DC | PRN
  Administered 2019-10-20: 10 mL via INTRAVENOUS
  Filled 2019-10-20: qty 10

## 2019-10-20 MED ORDER — DEXAMETHASONE SODIUM PHOSPHATE 10 MG/ML IJ SOLN
10.0000 mg | Freq: Once | INTRAMUSCULAR | Status: AC
  Administered 2019-10-20: 10 mg via INTRAVENOUS

## 2019-10-20 MED ORDER — LEUCOVORIN CALCIUM INJECTION 350 MG
400.0000 mg/m2 | Freq: Once | INTRAVENOUS | Status: AC
  Administered 2019-10-20: 588 mg via INTRAVENOUS
  Filled 2019-10-20: qty 29.4

## 2019-10-20 MED ORDER — FLUOROURACIL CHEMO INJECTION 2.5 GM/50ML
400.0000 mg/m2 | Freq: Once | INTRAVENOUS | Status: AC
  Administered 2019-10-20: 600 mg via INTRAVENOUS
  Filled 2019-10-20: qty 12

## 2019-10-20 MED ORDER — DEXAMETHASONE SODIUM PHOSPHATE 10 MG/ML IJ SOLN
INTRAMUSCULAR | Status: AC
  Filled 2019-10-20: qty 1

## 2019-10-20 MED ORDER — ATROPINE SULFATE 0.4 MG/ML IJ SOLN
INTRAMUSCULAR | Status: AC
  Filled 2019-10-20: qty 1

## 2019-10-20 MED ORDER — SODIUM CHLORIDE 0.9 % IV SOLN
2400.0000 mg/m2 | INTRAVENOUS | Status: DC
  Administered 2019-10-20: 3550 mg via INTRAVENOUS
  Filled 2019-10-20: qty 71

## 2019-10-20 MED FILL — XARELTO 20 MG TABLET: 20 | 30 days supply | Qty: 30 | Fill #1

## 2019-10-20 NOTE — Patient Instructions (Signed)
[facility] Discharge Instructions for Patients Receiving Chemotherapy  Today you received the following chemotherapy agents Irinotecan (CAMPTOSAR), Leucovorin & Flourouracil (ADRUCIL).  To help prevent nausea and vomiting after your treatment, we encourage you to take your nausea medication as prescribed.   If you develop nausea and vomiting that is not controlled by your nausea medication, call the clinic.   BELOW ARE SYMPTOMS THAT SHOULD BE REPORTED IMMEDIATELY:  *FEVER GREATER THAN 100.5 F  *CHILLS WITH OR WITHOUT FEVER  NAUSEA AND VOMITING THAT IS NOT CONTROLLED WITH YOUR NAUSEA MEDICATION  *UNUSUAL SHORTNESS OF BREATH  *UNUSUAL BRUISING OR BLEEDING  TENDERNESS IN MOUTH AND THROAT WITH OR WITHOUT PRESENCE OF ULCERS  *URINARY PROBLEMS  *BOWEL PROBLEMS  UNUSUAL RASH Items with * indicate a potential emergency and should be followed up as soon as possible.  Feel free to call the clinic should you have any questions or concerns. The clinic phone number is [phone].  Please show the CHEMO ALERT CARD at check-in to the Emergency Department and triage nurse.  Coronavirus (COVID-19) Are you at risk?  Are you at risk for the Coronavirus (COVID-19)?  To be considered HIGH RISK for Coronavirus (COVID-19), you have to meet the following criteria:  . Traveled to [place], [place], [place], [place] or [place]; or in the [place] to [place], [place], [place], or [place]; and have fever, cough, and shortness of breath within the last 2 weeks of travel OR . Been in close contact with a person diagnosed with COVID-19 within the last 2 weeks and have fever, cough, and shortness of breath . IF YOU DO NOT MEET THESE CRITERIA, YOU ARE CONSIDERED LOW RISK FOR COVID-19.  What to do if you are HIGH RISK for COVID-19?  . If you are having a medical emergency, call 911. . Seek medical care right away. Before you go to a doctor's office, urgent care or emergency  department, call ahead and tell them about your recent travel, contact with someone diagnosed with COVID-19, and your symptoms. You should receive instructions from your physician's office regarding next steps of care.  . When you arrive at healthcare provider, tell the healthcare staff immediately you have returned from visiting [place], [place], [place], [place] or [place]; or traveled in the [place] to [place], [place], [place], or [place]; in the last two weeks or you have been in close contact with a person diagnosed with COVID-19 in the last 2 weeks.   . Tell the health care staff about your symptoms: fever, cough and shortness of breath. . After you have been seen by a medical provider, you will be either: o Tested for (COVID-19) and discharged home on quarantine except to seek medical care if symptoms worsen, and asked to  - Stay home and avoid contact with others until you get your results (4-5 days)  - Avoid travel on public transportation if possible (such as bus, train, or airplane) or o Sent to the Emergency Department by EMS for evaluation, COVID-19 testing, and possible admission depending on your condition and test results.  What to do if you are LOW RISK for COVID-19?  Reduce your risk of any infection by using the same precautions used for avoiding the common cold or flu:  . Wash your hands often with soap and warm water for at least 20 seconds.  If soap and water are not readily available, use an alcohol-based hand sanitizer with at least 60% alcohol.  .   If coughing or sneezing, cover your mouth and nose by coughing or sneezing into the elbow areas of your shirt or coat, into a tissue or into your sleeve (not your hands). . Avoid shaking hands with others and consider head nods or verbal greetings only. . Avoid touching your eyes, nose, or mouth with unwashed hands.  . Avoid close contact with people who are sick. . Avoid places or events with large numbers of people  in one location, like concerts or sporting events. . Carefully consider travel plans you have or are making. . If you are planning any travel outside or inside the [place], visit the CDC's Travelers' Health webpage for the latest health notices. . If you have some symptoms but not all symptoms, continue to monitor at home and seek medical attention if your symptoms worsen. . If you are having a medical emergency, call 911.   ADDITIONAL HEALTHCARE OPTIONS FOR PATIENTS  [facility] / e-Visit: [email or web address]         [facility]: [phone]  [facility]: [phone]                   [facility]: [phone]   

## 2019-10-21 ENCOUNTER — Other Ambulatory Visit: Payer: Medicare Other

## 2019-10-22 ENCOUNTER — Other Ambulatory Visit: Payer: Self-pay

## 2019-10-22 ENCOUNTER — Inpatient Hospital Stay: Payer: Medicare Other

## 2019-10-22 VITALS — BP 126/80 | HR 70 | Temp 98.3°F | Resp 18

## 2019-10-22 DIAGNOSIS — C7989 Secondary malignant neoplasm of other specified sites: Secondary | ICD-10-CM | POA: Diagnosis not present

## 2019-10-22 DIAGNOSIS — C787 Secondary malignant neoplasm of liver and intrahepatic bile duct: Secondary | ICD-10-CM

## 2019-10-22 DIAGNOSIS — C189 Malignant neoplasm of colon, unspecified: Secondary | ICD-10-CM

## 2019-10-22 DIAGNOSIS — C778 Secondary and unspecified malignant neoplasm of lymph nodes of multiple regions: Secondary | ICD-10-CM | POA: Diagnosis not present

## 2019-10-22 DIAGNOSIS — Z5111 Encounter for antineoplastic chemotherapy: Secondary | ICD-10-CM | POA: Diagnosis not present

## 2019-10-22 DIAGNOSIS — C786 Secondary malignant neoplasm of retroperitoneum and peritoneum: Secondary | ICD-10-CM | POA: Diagnosis not present

## 2019-10-22 DIAGNOSIS — R911 Solitary pulmonary nodule: Secondary | ICD-10-CM | POA: Diagnosis not present

## 2019-10-22 MED ORDER — SODIUM CHLORIDE 0.9% FLUSH
10.0000 mL | INTRAVENOUS | Status: DC | PRN
  Administered 2019-10-22: 10 mL
  Filled 2019-10-22: qty 10

## 2019-10-22 MED ORDER — PEGFILGRASTIM-CBQV 6 MG/0.6ML ~~LOC~~ SOSY
PREFILLED_SYRINGE | SUBCUTANEOUS | Status: AC
  Filled 2019-10-22: qty 0.6

## 2019-10-22 MED ORDER — HEPARIN SOD (PORK) LOCK FLUSH 100 UNIT/ML IV SOLN
500.0000 [IU] | Freq: Once | INTRAVENOUS | Status: AC | PRN
  Administered 2019-10-22: 500 [IU]
  Filled 2019-10-22: qty 5

## 2019-10-22 MED ORDER — PEGFILGRASTIM-CBQV 6 MG/0.6ML ~~LOC~~ SOSY
6.0000 mg | PREFILLED_SYRINGE | Freq: Once | SUBCUTANEOUS | Status: AC
  Administered 2019-10-22: 6 mg via SUBCUTANEOUS

## 2019-10-22 NOTE — Patient Instructions (Signed)

## 2019-10-28 ENCOUNTER — Other Ambulatory Visit: Payer: Medicare Other

## 2019-10-28 ENCOUNTER — Ambulatory Visit: Payer: Medicare Other

## 2019-10-28 ENCOUNTER — Ambulatory Visit: Payer: Medicare Other | Admitting: Oncology

## 2019-11-02 ENCOUNTER — Telehealth: Payer: Self-pay | Admitting: *Deleted

## 2019-11-02 NOTE — Telephone Encounter (Addendum)
Called to report she does not want to continue chemotherapy any longer. States she feels too bad physically and emotionally after each treatment. She does agree to keep the appointment on 11/12 to talk with MD about this unless he wants to call her sooner to discuss. Per Dr. Benay Spice: He will discuss at visit on 11/11/19.

## 2019-11-08 ENCOUNTER — Telehealth: Payer: Self-pay | Admitting: *Deleted

## 2019-11-08 NOTE — Telephone Encounter (Signed)
Patient left VM reporting wishes to postpone her chemotherapy 1 1/2 months. Attempted to return call-no answer.

## 2019-11-10 ENCOUNTER — Telehealth: Payer: Self-pay | Admitting: Oncology

## 2019-11-10 ENCOUNTER — Telehealth: Payer: Self-pay | Admitting: Hematology

## 2019-11-10 NOTE — Telephone Encounter (Signed)
Opened by accident, pt does not see Dr. Burr Medico.

## 2019-11-10 NOTE — Telephone Encounter (Signed)
Returned patient's phone call regarding cancelling 11/12 appointment, per patient's request appointment has been cancelled. Voicemail not set up.

## 2019-11-11 ENCOUNTER — Other Ambulatory Visit: Payer: Medicare Other

## 2019-11-11 ENCOUNTER — Ambulatory Visit: Payer: Medicare Other | Admitting: Oncology

## 2019-11-11 ENCOUNTER — Ambulatory Visit: Payer: Medicare Other

## 2019-11-12 ENCOUNTER — Telehealth: Payer: Self-pay | Admitting: *Deleted

## 2019-11-12 NOTE — Telephone Encounter (Signed)
Called to let Dr. Benay Spice know she really wants to delay further treatment until after January 1st. She is feeling well and wants to enjoy this and time with her family. She will call office if she has any new pain or changes in her condition.

## 2019-11-17 ENCOUNTER — Telehealth: Payer: Self-pay | Admitting: *Deleted

## 2019-11-17 ENCOUNTER — Telehealth: Payer: Self-pay | Admitting: Oncology

## 2019-11-17 NOTE — Telephone Encounter (Signed)
Scheduled appt per 11/18 sch message - pt aware of appt date and time

## 2019-11-17 NOTE — Telephone Encounter (Signed)
Informed her that if she wants to wait on treatment till after January 1st, that is OK. She needs port flush in December and he would like to see her that day to plan for the future. She understands and agrees. Scheduling message sent and she is requesting appointment at 0800 if possible (early).

## 2019-11-22 MED FILL — XARELTO 20 MG TABLET: 20 | 30 days supply | Qty: 30 | Fill #1

## 2019-11-26 ENCOUNTER — Other Ambulatory Visit: Payer: Self-pay | Admitting: *Deleted

## 2019-11-26 DIAGNOSIS — C787 Secondary malignant neoplasm of liver and intrahepatic bile duct: Secondary | ICD-10-CM

## 2019-11-26 DIAGNOSIS — C189 Malignant neoplasm of colon, unspecified: Secondary | ICD-10-CM

## 2019-11-26 MED ORDER — RIVAROXABAN 20 MG PO TABS: 20.0000 mg | ORAL_TABLET | Freq: Every day | ORAL | 1 refills | Status: DC

## 2019-12-08 ENCOUNTER — Telehealth: Payer: Self-pay | Admitting: *Deleted

## 2019-12-08 NOTE — Telephone Encounter (Signed)
Called to inquire if it is safe to come in on 12/14 to see Dr. Benay Spice?  Per Dr. Benay Spice: I really need to see her and she is past due on port flush as well. Patient notified and agrees to come in.

## 2019-12-13 ENCOUNTER — Inpatient Hospital Stay: Payer: Medicare Other | Attending: Oncology | Admitting: Oncology

## 2019-12-13 ENCOUNTER — Other Ambulatory Visit: Payer: Self-pay

## 2019-12-13 ENCOUNTER — Inpatient Hospital Stay: Payer: Medicare Other

## 2019-12-13 VITALS — BP 141/77 | HR 73 | Temp 98.2°F | Resp 18 | Ht 66.0 in | Wt 109.1 lb

## 2019-12-13 DIAGNOSIS — Z923 Personal history of irradiation: Secondary | ICD-10-CM | POA: Insufficient documentation

## 2019-12-13 DIAGNOSIS — R911 Solitary pulmonary nodule: Secondary | ICD-10-CM | POA: Diagnosis not present

## 2019-12-13 DIAGNOSIS — Z452 Encounter for adjustment and management of vascular access device: Secondary | ICD-10-CM | POA: Insufficient documentation

## 2019-12-13 DIAGNOSIS — C7989 Secondary malignant neoplasm of other specified sites: Secondary | ICD-10-CM | POA: Insufficient documentation

## 2019-12-13 DIAGNOSIS — C787 Secondary malignant neoplasm of liver and intrahepatic bile duct: Secondary | ICD-10-CM

## 2019-12-13 DIAGNOSIS — Z95828 Presence of other vascular implants and grafts: Secondary | ICD-10-CM

## 2019-12-13 DIAGNOSIS — C786 Secondary malignant neoplasm of retroperitoneum and peritoneum: Secondary | ICD-10-CM | POA: Insufficient documentation

## 2019-12-13 DIAGNOSIS — N2889 Other specified disorders of kidney and ureter: Secondary | ICD-10-CM | POA: Diagnosis not present

## 2019-12-13 DIAGNOSIS — Z79899 Other long term (current) drug therapy: Secondary | ICD-10-CM | POA: Insufficient documentation

## 2019-12-13 DIAGNOSIS — C778 Secondary and unspecified malignant neoplasm of lymph nodes of multiple regions: Secondary | ICD-10-CM | POA: Insufficient documentation

## 2019-12-13 DIAGNOSIS — R590 Localized enlarged lymph nodes: Secondary | ICD-10-CM | POA: Diagnosis not present

## 2019-12-13 DIAGNOSIS — C189 Malignant neoplasm of colon, unspecified: Secondary | ICD-10-CM

## 2019-12-13 MED ORDER — HEPARIN SOD (PORK) LOCK FLUSH 100 UNIT/ML IV SOLN
500.0000 [IU] | Freq: Once | INTRAVENOUS | Status: AC | PRN
  Administered 2019-12-13: 500 [IU] via INTRAVENOUS
  Filled 2019-12-13: qty 5

## 2019-12-13 MED ORDER — SODIUM CHLORIDE 0.9% FLUSH
10.0000 mL | INTRAVENOUS | Status: DC | PRN
  Administered 2019-12-13: 08:00:00 10 mL via INTRAVENOUS
  Filled 2019-12-13: qty 10

## 2019-12-13 NOTE — Progress Notes (Signed)
[facility] OFFICE PROGRESS NOTE   Diagnosis: Colon cancer  INTERVAL HISTORY:   [name] was last treated with chemotherapy on 10/20/2019.  She decided against further chemotherapy.  She denies back pain.  She feels well.  She has occasional pain in the right lower abdomen when laying on the right side.  The lymph node in the left groin has not increased in size.  Objective:  Vital signs in last 24 hours:  Blood pressure (!) 141/77, pulse 73, temperature 98.2 F (36.8 C), temperature source Temporal, resp. rate 18, height '5\' 6"'$  (1.676 m), weight 109 lb 1.6 oz (49.5 kg), SpO2 100 %.     Lymphatics: Region.  No right inguinal nodes GI: No hepatosplenomegaly, no mass, nontender Vascular: No leg edema    Portacath/PICC-without erythema  Lab Results:  Lab Results  Component Value Date   WBC 4.8 10/20/2019   HGB 11.9 (L) 10/20/2019   HCT 36.7 10/20/2019   MCV 102.2 (H) 10/20/2019   PLT 155 10/20/2019   NEUTROABS 2.7 10/20/2019    CMP  Lab Results  Component Value Date   NA 141 10/20/2019   K 4.2 10/20/2019   CL 108 10/20/2019   CO2 22 10/20/2019   GLUCOSE 99 10/20/2019   BUN 14 10/20/2019   CREATININE 0.72 10/20/2019   CALCIUM 8.6 (L) 10/20/2019   PROT 6.7 10/20/2019   ALBUMIN 3.9 10/20/2019   AST 19 10/20/2019   ALT 20 10/20/2019   ALKPHOS 106 10/20/2019   BILITOT 0.4 10/20/2019   GFRNONAA >60 10/20/2019   GFRAA >60 10/20/2019    Lab Results  Component Value Date   CEA1 6.97 (H) 10/14/2019     Medications: I have reviewed the patient's current medications.   Assessment/Plan: 1. Metastatic colon cancer presenting with an Abdominal/pelvic mass  2. [place]pathology report dated 02/15/2015-invasive adenocarcinoma, well-differentiated with prominent mucinous features; microscopic tumor extension into mesenteric adipose tissue and focally involving visceral peritoneum; radial and mucosal margins  negative; lymph-vascular invasion withsuspicious foci identified;perineural invasion not identified; 16 negative lymph nodes; tumor deposits not identified; pT4apN0;MSI stable  ? CT 04/22/2017 revealed a heterogenous anterior pelvic wall mass, right inguinal mass, top normal sized retroperitoneal and iliac nodes, abutment of the anterior dome of the bladder and a loop of small bowel ? 04/29/2017 biopsy of anterior abdominal/pelvic wall mass-acellular mucin dissecting fibrous stroma ? Cycle 1 FOLFOX 05/15/2017 ? Cycle 2 FOLFOX 05/28/2017 ? Cycle 3 FOLFOX with Avastin on 06/11/2017 ? Cycle 4 FOLFOX/Avastin 06/25/2017 ? Cycle 5 FOLFOX/Avastin 07/10/2017, Neulasta added ? Restaging CT 07/21/2017-mild decrease in the size of a right inguinal node and the anterior abdominal wall mass ? Radiation to the abdominal wall mass 08/18/2017-09/03/2017 ? Status post excision of abdominal wall mass 10/03/2017--pathology showed adenocarcinoma with excessive extracellular mucin. Margins negative for tumor. Right inguinal wall mass also excised showing metastatic adenocarcinoma with excessive extracellular mucin in 1 of 8 lymph nodes. ? CT abdomen/pelvis 06/16/2019-new right hepatic lobe lesion measuring 2.7 x 1.7 cm. Hypoattenuation within the pancreatic uncinate process measuring 1.5 x 1.6 cm. Mild left adrenal thickening. Upper pole left renal mass 3.0 x 3.6 cm. Infiltrative hypoenhancement involving the lower pole left kidney. Nonocclusive thrombus within the infrarenal IVC. Necrotic adenopathy within the small bowel mesentery, new. New necrotic left inguinal nodes. Lytic lesion within and adjacent the anterior side of the L3 vertebral body. Contiguous mass within the left psoas measuring 4.6 x 3.4 cm. ? Cycle 1 FOLFIRI 06/23/2019 ?  Cycle 2 FOLFIRI 07/08/2019, Udenyca added ? Cycle 3 FOLFIRI 07/22/2019 ? Cycle 4 FOLFIRI 08/04/2019 ? Cycle 5 FOLFIRI 08/18/2019 ? CTs 09/08/2019-3.4 x 2.9 cm metastasis in  segment 7 liver, previously 2.7 x 1.7 cm. 14 mm lesion/implant along the inferior right hepatic lobe, possibly new or possibly related to the prior peritoneal implant along the right paracolic gutter. 4 x 6 mm subpleural nodule right lower lobe not clearly evident on the prior study. Near complete resolution of prior heterogeneous enhancement involving the bilateral kidneys which likely reflected pyelonephritis. Near complete resolution of prior lymphadenopathy along with small bowel mesentery. Mild left inguinal lymphadenopathy measuring up to 14 mm, previously 15 mm. Lytic metastasis at L3 with associated soft tissue metastasis involving the left psoas muscle improved. ? Cycle 6 FOLFIRI 09/16/2019, Udenyca ? Cycle 7 FOLFIRI 09/30/2019   3. Ongoing tobacco and alcohol use  4. Mild neutropenia related to chemotherapy 07/10/2017. Neulasta added.  5.Admission January 2019 with an abdominal wall abscess and pubis osteomyelitis, status post debridement and antibiotics  6.Pain secondary to #1. 7.CT 06/16/2019 with nonocclusive thrombus infrarenal IVC. Anticoagulation initiated. 8.Left patella fracture following a fall 08/20/2019     Disposition: [name] appears well.  There is no clinical evidence of disease progression.  She decided to discontinue chemotherapy after the last treatment in October.  She states today that she would like to resume chemotherapy after the holidays.  She will be scheduled for an office visit and FOLFIRI on 01/05/2019.  She will contact us in the interim as needed.  Betsy Coder, MD  12/13/2019  8:39 AM

## 2019-12-13 NOTE — Patient Instructions (Signed)

## 2019-12-14 ENCOUNTER — Telehealth: Payer: Self-pay | Admitting: Oncology

## 2019-12-14 NOTE — Telephone Encounter (Signed)
Scheduled per los. Called and spoke with patient. Confirmed appt 

## 2019-12-21 MED FILL — XARELTO 20 MG TABLET: 20 | 30 days supply | Qty: 30 | Fill #0

## 2019-12-31 ENCOUNTER — Other Ambulatory Visit: Payer: Self-pay | Admitting: Oncology

## 2020-01-06 ENCOUNTER — Inpatient Hospital Stay: Payer: Medicare Other | Attending: Oncology | Admitting: Nurse Practitioner

## 2020-01-06 ENCOUNTER — Inpatient Hospital Stay: Payer: Medicare Other

## 2020-01-06 ENCOUNTER — Encounter: Payer: Self-pay | Admitting: Nurse Practitioner

## 2020-01-06 ENCOUNTER — Other Ambulatory Visit: Payer: Self-pay

## 2020-01-06 VITALS — BP 141/80 | HR 81 | Temp 97.8°F | Resp 16 | Wt 109.8 lb

## 2020-01-06 DIAGNOSIS — N2889 Other specified disorders of kidney and ureter: Secondary | ICD-10-CM | POA: Diagnosis not present

## 2020-01-06 DIAGNOSIS — C189 Malignant neoplasm of colon, unspecified: Secondary | ICD-10-CM

## 2020-01-06 DIAGNOSIS — C778 Secondary and unspecified malignant neoplasm of lymph nodes of multiple regions: Secondary | ICD-10-CM | POA: Diagnosis not present

## 2020-01-06 DIAGNOSIS — R590 Localized enlarged lymph nodes: Secondary | ICD-10-CM | POA: Insufficient documentation

## 2020-01-06 DIAGNOSIS — C786 Secondary malignant neoplasm of retroperitoneum and peritoneum: Secondary | ICD-10-CM | POA: Insufficient documentation

## 2020-01-06 DIAGNOSIS — Z79899 Other long term (current) drug therapy: Secondary | ICD-10-CM | POA: Diagnosis not present

## 2020-01-06 DIAGNOSIS — Z7689 Persons encountering health services in other specified circumstances: Secondary | ICD-10-CM | POA: Insufficient documentation

## 2020-01-06 DIAGNOSIS — C7989 Secondary malignant neoplasm of other specified sites: Secondary | ICD-10-CM | POA: Diagnosis not present

## 2020-01-06 DIAGNOSIS — Z5111 Encounter for antineoplastic chemotherapy: Secondary | ICD-10-CM | POA: Insufficient documentation

## 2020-01-06 DIAGNOSIS — Z923 Personal history of irradiation: Secondary | ICD-10-CM | POA: Diagnosis not present

## 2020-01-06 DIAGNOSIS — C787 Secondary malignant neoplasm of liver and intrahepatic bile duct: Secondary | ICD-10-CM | POA: Diagnosis not present

## 2020-01-06 DIAGNOSIS — R911 Solitary pulmonary nodule: Secondary | ICD-10-CM | POA: Diagnosis not present

## 2020-01-06 DIAGNOSIS — Z95828 Presence of other vascular implants and grafts: Secondary | ICD-10-CM

## 2020-01-06 LAB — CMP (CANCER CENTER ONLY)
ALT: 15 U/L (ref 0–44)
AST: 15 U/L (ref 15–41)
Albumin: 4.2 g/dL (ref 3.5–5.0)
Alkaline Phosphatase: 84 U/L (ref 38–126)
Anion gap: 10 (ref 5–15)
BUN: 25 mg/dL — ABNORMAL HIGH (ref 8–23)
CO2: 23 mmol/L (ref 22–32)
Calcium: 8.4 mg/dL — ABNORMAL LOW (ref 8.9–10.3)
Chloride: 107 mmol/L (ref 98–111)
Creatinine: 0.75 mg/dL (ref 0.44–1.00)
GFR, Est AFR Am: >60 mL/min (ref >60)
GFR, Estimated: >60 mL/min (ref >60)
Glucose, Bld: 95 mg/dL (ref 70–99)
Potassium: 4.2 mmol/L (ref 3.5–5.1)
Sodium: 140 mmol/L (ref 135–145)
Total Bilirubin: 0.4 mg/dL (ref 0.3–1.2)
Total Protein: 7.1 g/dL (ref 6.5–8.1)

## 2020-01-06 LAB — CBC WITH DIFFERENTIAL (CANCER CENTER ONLY)
Abs Immature Granulocytes: 0.02 10*3/uL (ref 0.00–0.07)
Basophils Absolute: 0 10*3/uL (ref 0.0–0.1)
Basophils Relative: 1 %
Eosinophils Absolute: 0.5 10*3/uL (ref 0.0–0.5)
Eosinophils Relative: 9 %
HCT: 41 % (ref 36.0–46.0)
Hemoglobin: 13.8 g/dL (ref 12.0–15.0)
Immature Granulocytes: 0 %
Lymphocytes Relative: 27 %
Lymphs Abs: 1.6 10*3/uL (ref 0.7–4.0)
MCH: 32.9 pg (ref 26.0–34.0)
MCHC: 33.7 g/dL (ref 30.0–36.0)
MCV: 97.6 fL (ref 80.0–100.0)
Monocytes Absolute: 0.5 10*3/uL (ref 0.1–1.0)
Monocytes Relative: 8 %
Neutro Abs: 3.3 10*3/uL (ref 1.7–7.7)
Neutrophils Relative %: 55 %
Platelet Count: 137 10*3/uL — ABNORMAL LOW (ref 150–400)
RBC: 4.2 MIL/uL (ref 3.87–5.11)
RDW: 12 % (ref 11.5–15.5)
WBC Count: 5.9 10*3/uL (ref 4.0–10.5)
nRBC: 0 % (ref 0.0–0.2)

## 2020-01-06 LAB — CEA (IN HOUSE-CHCC): CEA (CHCC-In House): 3.7 ng/mL (ref 0.00–5.00)

## 2020-01-06 MED ORDER — SODIUM CHLORIDE 0.9 % IV SOLN
Freq: Once | INTRAVENOUS | Status: AC
  Filled 2020-01-06: qty 250

## 2020-01-06 MED ORDER — DEXAMETHASONE SODIUM PHOSPHATE 10 MG/ML IJ SOLN
10.0000 mg | Freq: Once | INTRAMUSCULAR | Status: AC
  Administered 2020-01-06: 12:00:00 10 mg via INTRAVENOUS

## 2020-01-06 MED ORDER — FLUOROURACIL CHEMO INJECTION 2.5 GM/50ML
400.0000 mg/m2 | Freq: Once | INTRAVENOUS | Status: AC
  Administered 2020-01-06: 14:00:00 600 mg via INTRAVENOUS
  Filled 2020-01-06: qty 12

## 2020-01-06 MED ORDER — LEUCOVORIN CALCIUM INJECTION 350 MG
400.0000 mg/m2 | Freq: Once | INTRAVENOUS | Status: AC
  Administered 2020-01-06: 13:00:00 588 mg via INTRAVENOUS
  Filled 2020-01-06: qty 29.4

## 2020-01-06 MED ORDER — ATROPINE SULFATE 0.4 MG/ML IJ SOLN
INTRAMUSCULAR | Status: AC
  Filled 2020-01-06: qty 1

## 2020-01-06 MED ORDER — SODIUM CHLORIDE 0.9 % IV SOLN
2400.0000 mg/m2 | INTRAVENOUS | Status: DC
  Administered 2020-01-06: 14:00:00 3550 mg via INTRAVENOUS
  Filled 2020-01-06: qty 71

## 2020-01-06 MED ORDER — SODIUM CHLORIDE 0.9% FLUSH
10.0000 mL | INTRAVENOUS | Status: DC | PRN
  Administered 2020-01-06: 10 mL via INTRAVENOUS
  Filled 2020-01-06: qty 10

## 2020-01-06 MED ORDER — ATROPINE SULFATE 0.4 MG/ML IJ SOLN
0.4000 mg | Freq: Once | INTRAMUSCULAR | Status: AC | PRN
  Administered 2020-01-06: 13:00:00 0.4 mg via INTRAVENOUS

## 2020-01-06 MED ORDER — PALONOSETRON HCL INJECTION 0.25 MG/5ML
0.2500 mg | Freq: Once | INTRAVENOUS | Status: AC
  Administered 2020-01-06: 12:00:00 0.25 mg via INTRAVENOUS

## 2020-01-06 MED ORDER — IRINOTECAN HCL CHEMO INJECTION 100 MG/5ML
180.0000 mg/m2 | Freq: Once | INTRAVENOUS | Status: AC
  Administered 2020-01-06: 260 mg via INTRAVENOUS
  Filled 2020-01-06: qty 13

## 2020-01-06 MED ORDER — PALONOSETRON HCL INJECTION 0.25 MG/5ML
INTRAVENOUS | Status: AC
  Filled 2020-01-06: qty 5

## 2020-01-06 MED ORDER — DEXAMETHASONE SODIUM PHOSPHATE 10 MG/ML IJ SOLN
INTRAMUSCULAR | Status: AC
  Filled 2020-01-06: qty 1

## 2020-01-06 NOTE — Progress Notes (Signed)
[facility] PROGRESS NOTE   Diagnosis: Colon cancer  INTERVAL HISTORY:   Yolanda Watson returns as scheduled.  She feels "great".  She has a good appetite.  No nausea or vomiting.  No mouth sores.  No diarrhea.  She denies pain.  No bleeding.  Objective:  Vital signs in last 24 hours:  Blood pressure (!) 141/80, pulse 81, temperature 97.8 F (36.6 C), temperature source Temporal, resp. rate 16, weight 109 lb 12.8 oz (49.8 kg), SpO2 100 %.    HEENT: No thrush or ulcers. Lymphatics: Approximate 1 cm firm left inguinal lymph node. GI: Abdomen soft and nontender.  No hepatomegaly. Vascular: No leg edema. Neuro: Alert and oriented. Skin: Small abrasion at the right lower leg. Port-A-Cath without erythema.   Lab Results:  Lab Results  Component Value Date   WBC 5.9 01/06/2020   HGB 13.8 01/06/2020   HCT 41.0 01/06/2020   MCV 97.6 01/06/2020   PLT 137 (L) 01/06/2020   NEUTROABS 3.3 01/06/2020    Imaging:  No results found.  Medications: I have reviewed the patient's current medications.  Assessment/Plan: 1. Metastatic colon cancer presenting with an Abdominal/pelvic mass  2. Colon cancerdiagnosed in [place] in 2016-pathology report dated 02/15/2015-invasive adenocarcinoma, well-differentiated with prominent mucinous features; microscopic tumor extension into mesenteric adipose tissue and focally involving visceral peritoneum; radial and mucosal margins negative; lymph-vascular invasion withsuspicious foci identified;perineural invasion not identified; 16 negative lymph nodes; tumor deposits not identified; pT4apN0;MSI stable  ? CT 04/22/2017 revealed a heterogenous anterior pelvic wall mass, right inguinal mass, top normal sized retroperitoneal and iliac nodes, abutment of the anterior dome of the bladder and a loop of small bowel ? 04/29/2017 biopsy of anterior abdominal/pelvic wall mass-acellular mucin dissecting fibrous stroma ? Cycle 1  FOLFOX 05/15/2017 ? Cycle 2 FOLFOX 05/28/2017 ? Cycle 3 FOLFOX with Avastin on 06/11/2017 ? Cycle 4 FOLFOX/Avastin 06/25/2017 ? Cycle 5 FOLFOX/Avastin 07/10/2017, Neulasta added ? Restaging CT 07/21/2017-mild decrease in the size of a right inguinal node and the anterior abdominal wall mass ? Radiation to the abdominal wall mass 08/18/2017-09/03/2017 ? Status post excision of abdominal wall mass 10/03/2017--pathology showed adenocarcinoma with excessive extracellular mucin. Margins negative for tumor. Right inguinal wall mass also excised showing metastatic adenocarcinoma with excessive extracellular mucin in 1 of 8 lymph nodes. ? CT abdomen/pelvis 06/16/2019-new right hepatic lobe lesion measuring 2.7 x 1.7 cm. Hypoattenuation within the pancreatic uncinate process measuring 1.5 x 1.6 cm. Mild left adrenal thickening. Upper pole left renal mass 3.0 x 3.6 cm. Infiltrative hypoenhancement involving the lower pole left kidney. Nonocclusive thrombus within the infrarenal IVC. Necrotic adenopathy within the small bowel mesentery, new. New necrotic left inguinal nodes. Lytic lesion within and adjacent the anterior side of the L3 vertebral body. Contiguous mass within the left psoas measuring 4.6 x 3.4 cm. ? Cycle 1 FOLFIRI 06/23/2019 ? Cycle 2 FOLFIRI 07/08/2019, Udenyca added ? Cycle 3 FOLFIRI 07/22/2019 ? Cycle 4 FOLFIRI 08/04/2019 ? Cycle 5 FOLFIRI 08/18/2019 ? CTs 09/08/2019-3.4 x 2.9 cm metastasis in segment 7 liver, previously 2.7 x 1.7 cm. 14 mm lesion/implant along the inferior right hepatic lobe, possibly new or possibly related to the prior peritoneal implant along the right paracolic gutter. 4 x 6 mm subpleural nodule right lower lobe not clearly evident on the prior study. Near complete resolution of prior heterogeneous enhancement involving the bilateral kidneys which likely reflected pyelonephritis. Near complete resolution of prior lymphadenopathy along with small bowel mesentery. Mild  left inguinal lymphadenopathy measuring  up to 14 mm, previously 15 mm. Lytic metastasis at L3 with associated soft tissue metastasis involving the left psoas muscle improved. ? Cycle 6 FOLFIRI 09/16/2019, Udenyca ? Cycle 7 FOLFIRI 09/30/2019 ? Cycle 8 FOLFIRI 01/06/2020   3. Ongoing tobacco and alcohol use  4. Mild neutropenia related to chemotherapy 07/10/2017. Neulasta added.  5.Admission January 2019 with an abdominal wall abscess and pubis osteomyelitis, status post debridement and antibiotics  6.Pain secondary to #1. 7.CT 06/16/2019 with nonocclusive thrombus infrarenal IVC. Anticoagulation initiated. 8.Left patella fracture following a fall 08/20/2019     Disposition: Yolanda Watson appears stable.  She is scheduled to resume treatment with FOLFIRI.  We again reviewed potential toxicities.  She agrees to proceed.  Plan to proceed with FOLFIRI today as scheduled.  She will receive Udenyca on the day of pump discontinuation.  She requests the chemotherapy be given on a 3-week schedule rather than 2-week schedule.  We reviewed the CBC from today.  Counts adequate to proceed with treatment.  She will return for lab, follow-up, cycle 9 FOLFIRI in 3 weeks.  She will contact the office in the interim with any problems.    Ned Card ANP/GNP-BC   01/06/2020  10:28 AM

## 2020-01-06 NOTE — Patient Instructions (Signed)
[facility] Discharge Instructions for Patients Receiving Chemotherapy  Today you received the following chemotherapy agents: Irinotecan, leucovorin, 5FU  To help prevent nausea and vomiting after your treatment, we encourage you to take your nausea medication as directed.   If you develop nausea and vomiting that is not controlled by your nausea medication, call the clinic.   BELOW ARE SYMPTOMS THAT SHOULD BE REPORTED IMMEDIATELY:  *FEVER GREATER THAN 100.5 F  *CHILLS WITH OR WITHOUT FEVER  NAUSEA AND VOMITING THAT IS NOT CONTROLLED WITH YOUR NAUSEA MEDICATION  *UNUSUAL SHORTNESS OF BREATH  *UNUSUAL BRUISING OR BLEEDING  TENDERNESS IN MOUTH AND THROAT WITH OR WITHOUT PRESENCE OF ULCERS  *URINARY PROBLEMS  *BOWEL PROBLEMS  UNUSUAL RASH Items with * indicate a potential emergency and should be followed up as soon as possible.  Feel free to call the clinic should you have any questions or concerns. The clinic phone number is [phone].  Please show the CHEMO ALERT CARD at check-in to the Emergency Department and triage nurse.   

## 2020-01-08 ENCOUNTER — Inpatient Hospital Stay: Payer: Medicare Other

## 2020-01-08 ENCOUNTER — Other Ambulatory Visit: Payer: Self-pay

## 2020-01-08 VITALS — BP 145/75 | HR 80 | Temp 98.9°F | Resp 17

## 2020-01-08 DIAGNOSIS — C778 Secondary and unspecified malignant neoplasm of lymph nodes of multiple regions: Secondary | ICD-10-CM | POA: Diagnosis not present

## 2020-01-08 DIAGNOSIS — C786 Secondary malignant neoplasm of retroperitoneum and peritoneum: Secondary | ICD-10-CM | POA: Diagnosis not present

## 2020-01-08 DIAGNOSIS — N2889 Other specified disorders of kidney and ureter: Secondary | ICD-10-CM | POA: Diagnosis not present

## 2020-01-08 DIAGNOSIS — C7989 Secondary malignant neoplasm of other specified sites: Secondary | ICD-10-CM | POA: Diagnosis not present

## 2020-01-08 DIAGNOSIS — C189 Malignant neoplasm of colon, unspecified: Secondary | ICD-10-CM | POA: Diagnosis not present

## 2020-01-08 DIAGNOSIS — Z5111 Encounter for antineoplastic chemotherapy: Secondary | ICD-10-CM | POA: Diagnosis not present

## 2020-01-08 MED ORDER — PEGFILGRASTIM-CBQV 6 MG/0.6ML ~~LOC~~ SOSY
6.0000 mg | PREFILLED_SYRINGE | Freq: Once | SUBCUTANEOUS | Status: AC
  Administered 2020-01-08: 12:00:00 6 mg via SUBCUTANEOUS

## 2020-01-08 MED ORDER — HEPARIN SOD (PORK) LOCK FLUSH 100 UNIT/ML IV SOLN
500.0000 [IU] | Freq: Once | INTRAVENOUS | Status: AC | PRN
  Administered 2020-01-08: 12:00:00 500 [IU]
  Filled 2020-01-08: qty 5

## 2020-01-08 MED ORDER — SODIUM CHLORIDE 0.9% FLUSH
10.0000 mL | INTRAVENOUS | Status: DC | PRN
  Administered 2020-01-08: 10 mL
  Filled 2020-01-08: qty 10

## 2020-01-13 ENCOUNTER — Telehealth: Payer: Self-pay | Admitting: Oncology

## 2020-01-13 NOTE — Telephone Encounter (Signed)
Returned patient's phone call regarding rescheduling 01/28 appointment, per patient's request appointment time has been rescheduled.

## 2020-01-20 MED FILL — XARELTO 20 MG TABLET: 20 | 30 days supply | Qty: 30 | Fill #1

## 2020-01-22 ENCOUNTER — Other Ambulatory Visit: Payer: Self-pay | Admitting: Oncology

## 2020-01-25 ENCOUNTER — Other Ambulatory Visit: Payer: Medicare Other

## 2020-01-25 ENCOUNTER — Ambulatory Visit: Payer: Medicare Other | Admitting: Nurse Practitioner

## 2020-01-26 ENCOUNTER — Other Ambulatory Visit: Payer: Self-pay

## 2020-01-26 ENCOUNTER — Inpatient Hospital Stay: Payer: Medicare Other

## 2020-01-26 ENCOUNTER — Inpatient Hospital Stay (HOSPITAL_BASED_OUTPATIENT_CLINIC_OR_DEPARTMENT_OTHER): Payer: Medicare Other | Admitting: Nurse Practitioner

## 2020-01-26 ENCOUNTER — Encounter: Payer: Self-pay | Admitting: Nurse Practitioner

## 2020-01-26 VITALS — BP 139/85 | HR 75 | Temp 97.8°F | Resp 18 | Ht 66.0 in | Wt 109.0 lb

## 2020-01-26 DIAGNOSIS — C7989 Secondary malignant neoplasm of other specified sites: Secondary | ICD-10-CM | POA: Diagnosis not present

## 2020-01-26 DIAGNOSIS — C189 Malignant neoplasm of colon, unspecified: Secondary | ICD-10-CM

## 2020-01-26 DIAGNOSIS — C787 Secondary malignant neoplasm of liver and intrahepatic bile duct: Secondary | ICD-10-CM

## 2020-01-26 DIAGNOSIS — C778 Secondary and unspecified malignant neoplasm of lymph nodes of multiple regions: Secondary | ICD-10-CM | POA: Diagnosis not present

## 2020-01-26 DIAGNOSIS — Z5111 Encounter for antineoplastic chemotherapy: Secondary | ICD-10-CM | POA: Diagnosis not present

## 2020-01-26 DIAGNOSIS — N2889 Other specified disorders of kidney and ureter: Secondary | ICD-10-CM | POA: Diagnosis not present

## 2020-01-26 DIAGNOSIS — C786 Secondary malignant neoplasm of retroperitoneum and peritoneum: Secondary | ICD-10-CM | POA: Diagnosis not present

## 2020-01-26 LAB — CBC WITH DIFFERENTIAL (CANCER CENTER ONLY)
Abs Immature Granulocytes: 0.05 10*3/uL (ref 0.00–0.07)
Basophils Absolute: 0.1 10*3/uL (ref 0.0–0.1)
Basophils Relative: 1 %
Eosinophils Absolute: 0.4 10*3/uL (ref 0.0–0.5)
Eosinophils Relative: 4 %
HCT: 39.1 % (ref 36.0–46.0)
Hemoglobin: 13.2 g/dL (ref 12.0–15.0)
Immature Granulocytes: 1 %
Lymphocytes Relative: 16 %
Lymphs Abs: 1.5 10*3/uL (ref 0.7–4.0)
MCH: 32.6 pg (ref 26.0–34.0)
MCHC: 33.8 g/dL (ref 30.0–36.0)
MCV: 96.5 fL (ref 80.0–100.0)
Monocytes Absolute: 0.6 10*3/uL (ref 0.1–1.0)
Monocytes Relative: 6 %
Neutro Abs: 6.6 10*3/uL (ref 1.7–7.7)
Neutrophils Relative %: 72 %
Platelet Count: 157 10*3/uL (ref 150–400)
RBC: 4.05 MIL/uL (ref 3.87–5.11)
RDW: 13.2 % (ref 11.5–15.5)
WBC Count: 9.2 10*3/uL (ref 4.0–10.5)
nRBC: 0 % (ref 0.0–0.2)

## 2020-01-26 LAB — CMP (CANCER CENTER ONLY)
ALT: 11 U/L (ref 0–44)
AST: 14 U/L — ABNORMAL LOW (ref 15–41)
Albumin: 4.1 g/dL (ref 3.5–5.0)
Alkaline Phosphatase: 118 U/L (ref 38–126)
Anion gap: 10 (ref 5–15)
BUN: 23 mg/dL (ref 8–23)
CO2: 24 mmol/L (ref 22–32)
Calcium: 8.7 mg/dL — ABNORMAL LOW (ref 8.9–10.3)
Chloride: 106 mmol/L (ref 98–111)
Creatinine: 0.75 mg/dL (ref 0.44–1.00)
GFR, Est AFR Am: >60 mL/min (ref >60)
GFR, Estimated: >60 mL/min (ref >60)
Glucose, Bld: 98 mg/dL (ref 70–99)
Potassium: 4.2 mmol/L (ref 3.5–5.1)
Sodium: 140 mmol/L (ref 135–145)
Total Bilirubin: 0.5 mg/dL (ref 0.3–1.2)
Total Protein: 7 g/dL (ref 6.5–8.1)

## 2020-01-26 MED ORDER — IRINOTECAN HCL CHEMO INJECTION 100 MG/5ML
180.0000 mg/m2 | Freq: Once | INTRAVENOUS | Status: AC
  Administered 2020-01-26: 10:00:00 260 mg via INTRAVENOUS
  Filled 2020-01-26: qty 13

## 2020-01-26 MED ORDER — LEUCOVORIN CALCIUM INJECTION 350 MG
400.0000 mg/m2 | Freq: Once | INTRAVENOUS | Status: AC
  Administered 2020-01-26: 588 mg via INTRAVENOUS
  Filled 2020-01-26: qty 29.4

## 2020-01-26 MED ORDER — ATROPINE SULFATE 0.4 MG/ML IJ SOLN
0.4000 mg | Freq: Once | INTRAMUSCULAR | Status: AC | PRN
  Administered 2020-01-26: 0.4 mg via INTRAVENOUS

## 2020-01-26 MED ORDER — ATROPINE SULFATE 0.4 MG/ML IJ SOLN
INTRAMUSCULAR | Status: AC
  Filled 2020-01-26: qty 1

## 2020-01-26 MED ORDER — PALONOSETRON HCL INJECTION 0.25 MG/5ML
0.2500 mg | Freq: Once | INTRAVENOUS | Status: AC
  Administered 2020-01-26: 0.25 mg via INTRAVENOUS

## 2020-01-26 MED ORDER — DEXAMETHASONE SODIUM PHOSPHATE 10 MG/ML IJ SOLN
10.0000 mg | Freq: Once | INTRAMUSCULAR | Status: AC
  Administered 2020-01-26: 10 mg via INTRAVENOUS

## 2020-01-26 MED ORDER — FLUOROURACIL CHEMO INJECTION 2.5 GM/50ML
400.0000 mg/m2 | Freq: Once | INTRAVENOUS | Status: AC
  Administered 2020-01-26: 600 mg via INTRAVENOUS
  Filled 2020-01-26: qty 12

## 2020-01-26 MED ORDER — PALONOSETRON HCL INJECTION 0.25 MG/5ML
INTRAVENOUS | Status: AC
  Filled 2020-01-26: qty 5

## 2020-01-26 MED ORDER — DEXAMETHASONE SODIUM PHOSPHATE 10 MG/ML IJ SOLN
INTRAMUSCULAR | Status: AC
  Filled 2020-01-26: qty 1

## 2020-01-26 MED ORDER — SODIUM CHLORIDE 0.9 % IV SOLN
2400.0000 mg/m2 | INTRAVENOUS | Status: DC
  Administered 2020-01-26: 3550 mg via INTRAVENOUS
  Filled 2020-01-26: qty 71

## 2020-01-26 MED ORDER — SODIUM CHLORIDE 0.9 % IV SOLN
Freq: Once | INTRAVENOUS | Status: AC
  Filled 2020-01-26: qty 250

## 2020-01-26 NOTE — Patient Instructions (Signed)
[facility] Discharge Instructions for Patients Receiving Chemotherapy  Today you received the following chemotherapy agents: Irinotecan, leucovorin, 5FU  To help prevent nausea and vomiting after your treatment, we encourage you to take your nausea medication as directed.   If you develop nausea and vomiting that is not controlled by your nausea medication, call the clinic.   BELOW ARE SYMPTOMS THAT SHOULD BE REPORTED IMMEDIATELY:  *FEVER GREATER THAN 100.5 F  *CHILLS WITH OR WITHOUT FEVER  NAUSEA AND VOMITING THAT IS NOT CONTROLLED WITH YOUR NAUSEA MEDICATION  *UNUSUAL SHORTNESS OF BREATH  *UNUSUAL BRUISING OR BLEEDING  TENDERNESS IN MOUTH AND THROAT WITH OR WITHOUT PRESENCE OF ULCERS  *URINARY PROBLEMS  *BOWEL PROBLEMS  UNUSUAL RASH Items with * indicate a potential emergency and should be followed up as soon as possible.  Feel free to call the clinic should you have any questions or concerns. The clinic phone number is [phone].  Please show the CHEMO ALERT CARD at check-in to the Emergency Department and triage nurse.   

## 2020-01-26 NOTE — Progress Notes (Signed)
[place] OFFICE PROGRESS NOTE   Diagnosis: Colon cancer  INTERVAL HISTORY:   Ms. Knodel returns for follow-up.  She completed a cycle of FOLFIRI 01/06/2020.  She feels she is tolerating the chemotherapy well.  She denies nausea/vomiting.  No mouth sores.  No diarrhea.  No hand or foot pain or redness.  Objective:  Vital signs in last 24 hours:  Temperature 97.8, heart rate 75, respirations 18, blood pressure 139/85, weight 109 pounds    HEENT: No thrush or ulcers. Lymphatics: Approximate 1 cm firm left inguinal lymph node. Resp: Lungs clear bilaterally. Cardio: Regular rate and rhythm. GI: Abdomen soft and nontender.  No hepatomegaly. Vascular: No leg edema. Port-A-Cath without erythema.  Lab Results:  Lab Results  Component Value Date   WBC 9.2 01/26/2020   HGB 13.2 01/26/2020   HCT 39.1 01/26/2020   MCV 96.5 01/26/2020   PLT 157 01/26/2020   NEUTROABS 6.6 01/26/2020    Imaging:  No results found.  Medications: I have reviewed the patient's current medications.  Assessment/Plan: 1. Metastatic colon cancer presenting with an Abdominal/pelvic mass  2. [place]pathology report dated 02/15/2015-invasive adenocarcinoma, well-differentiated with prominent mucinous features; microscopic tumor extension into mesenteric adipose tissue and focally involving visceral peritoneum; radial and mucosal margins negative; lymph-vascular invasion withsuspicious foci identified;perineural invasion not identified; 16 negative lymph nodes; tumor deposits not identified; pT4apN0;MSI stable  ? CT 04/22/2017 revealed a heterogenous anterior pelvic wall mass, right inguinal mass, top normal sized retroperitoneal and iliac nodes, abutment of the anterior dome of the bladder and a loop of small bowel ? 04/29/2017 biopsy of anterior abdominal/pelvic wall mass-acellular mucin dissecting fibrous stroma ? Cycle 1 FOLFOX 05/15/2017 ? Cycle  2 FOLFOX 05/28/2017 ? Cycle 3 FOLFOX with Avastin on 06/11/2017 ? Cycle 4 FOLFOX/Avastin 06/25/2017 ? Cycle 5 FOLFOX/Avastin 07/10/2017, Neulasta added ? Restaging CT 07/21/2017-mild decrease in the size of a right inguinal node and the anterior abdominal wall mass ? Radiation to the abdominal wall mass 08/18/2017-09/03/2017 ? Status post excision of abdominal wall mass 10/03/2017--pathology showed adenocarcinoma with excessive extracellular mucin. Margins negative for tumor. Right inguinal wall mass also excised showing metastatic adenocarcinoma with excessive extracellular mucin in 1 of 8 lymph nodes. ? CT abdomen/pelvis 06/16/2019-new right hepatic lobe lesion measuring 2.7 x 1.7 cm. Hypoattenuation within the pancreatic uncinate process measuring 1.5 x 1.6 cm. Mild left adrenal thickening. Upper pole left renal mass 3.0 x 3.6 cm. Infiltrative hypoenhancement involving the lower pole left kidney. Nonocclusive thrombus within the infrarenal IVC. Necrotic adenopathy within the small bowel mesentery, new. New necrotic left inguinal nodes. Lytic lesion within and adjacent the anterior side of the L3 vertebral body. Contiguous mass within the left psoas measuring 4.6 x 3.4 cm. ? Cycle 1 FOLFIRI 06/23/2019 ? Cycle 2 FOLFIRI 07/08/2019, Udenyca added ? Cycle 3 FOLFIRI 07/22/2019 ? Cycle 4 FOLFIRI 08/04/2019 ? Cycle 5 FOLFIRI 08/18/2019 ? CTs 09/08/2019-3.4 x 2.9 cm metastasis in segment 7 liver, previously 2.7 x 1.7 cm. 14 mm lesion/implant along the inferior right hepatic lobe, possibly new or possibly related to the prior peritoneal implant along the right paracolic gutter. 4 x 6 mm subpleural nodule right lower lobe not clearly evident on the prior study. Near complete resolution of prior heterogeneous enhancement involving the bilateral kidneys which likely reflected pyelonephritis. Near complete resolution of prior lymphadenopathy along with small bowel mesentery. Mild left inguinal  lymphadenopathy measuring up to 14 mm, previously 15 mm. Lytic metastasis at L3 with  associated soft tissue metastasis involving the left psoas muscle improved. ? Cycle 6 FOLFIRI 09/16/2019, Udenyca ? Cycle 7 FOLFIRI 09/30/2019 ? Cycle 8 FOLFIRI 01/06/2020 ? Cycle 9 FOLFIRI 01/26/2020   3. Ongoing tobacco and alcohol use  4. Mild neutropenia related to chemotherapy 07/10/2017. Neulasta added.  5.Admission January 2019 with an abdominal wall abscess and pubis osteomyelitis, status post debridement and antibiotics  6.Pain secondary to #1. 7.CT 06/16/2019 with nonocclusive thrombus infrarenal IVC. Anticoagulation initiated. 8.Left patella fracture following a fall 08/20/2019    Disposition: Ms. Hamblin appears stable.  FOLFIRI was resumed 3 weeks ago.  She tolerated that cycle well. Plan to proceed with treatment today as scheduled.  We reviewed the CBC and chemistry panel from today.  Labs are adequate to proceed as above.  She will return for lab, follow-up, FOLFIRI in 3 weeks.  She will contact the office in the interim with any problems.  Ned Card ANP/GNP-BC   01/26/2020  10:52 AM

## 2020-01-27 ENCOUNTER — Ambulatory Visit: Payer: Medicare Other

## 2020-01-28 ENCOUNTER — Other Ambulatory Visit: Payer: Self-pay

## 2020-01-28 ENCOUNTER — Inpatient Hospital Stay: Payer: Medicare Other

## 2020-01-28 VITALS — BP 135/80 | HR 77 | Temp 98.1°F | Resp 18

## 2020-01-28 DIAGNOSIS — C189 Malignant neoplasm of colon, unspecified: Secondary | ICD-10-CM | POA: Diagnosis not present

## 2020-01-28 DIAGNOSIS — C787 Secondary malignant neoplasm of liver and intrahepatic bile duct: Secondary | ICD-10-CM

## 2020-01-28 DIAGNOSIS — N2889 Other specified disorders of kidney and ureter: Secondary | ICD-10-CM | POA: Diagnosis not present

## 2020-01-28 DIAGNOSIS — C778 Secondary and unspecified malignant neoplasm of lymph nodes of multiple regions: Secondary | ICD-10-CM | POA: Diagnosis not present

## 2020-01-28 DIAGNOSIS — C786 Secondary malignant neoplasm of retroperitoneum and peritoneum: Secondary | ICD-10-CM | POA: Diagnosis not present

## 2020-01-28 DIAGNOSIS — C7989 Secondary malignant neoplasm of other specified sites: Secondary | ICD-10-CM | POA: Diagnosis not present

## 2020-01-28 DIAGNOSIS — Z5111 Encounter for antineoplastic chemotherapy: Secondary | ICD-10-CM | POA: Diagnosis not present

## 2020-01-28 MED ORDER — SODIUM CHLORIDE 0.9% FLUSH
10.0000 mL | INTRAVENOUS | Status: DC | PRN
  Administered 2020-01-28: 11:00:00 10 mL
  Filled 2020-01-28: qty 10

## 2020-01-28 MED ORDER — HEPARIN SOD (PORK) LOCK FLUSH 100 UNIT/ML IV SOLN
500.0000 [IU] | Freq: Once | INTRAVENOUS | Status: AC | PRN
  Administered 2020-01-28: 500 [IU]
  Filled 2020-01-28: qty 5

## 2020-01-28 MED ORDER — PEGFILGRASTIM-CBQV 6 MG/0.6ML ~~LOC~~ SOSY
PREFILLED_SYRINGE | SUBCUTANEOUS | Status: AC
  Filled 2020-01-28: qty 0.6

## 2020-01-28 MED ORDER — PEGFILGRASTIM-CBQV 6 MG/0.6ML ~~LOC~~ SOSY
6.0000 mg | PREFILLED_SYRINGE | Freq: Once | SUBCUTANEOUS | Status: AC
  Administered 2020-01-28: 11:00:00 6 mg via SUBCUTANEOUS

## 2020-02-13 ENCOUNTER — Other Ambulatory Visit: Payer: Self-pay | Admitting: Oncology

## 2020-02-16 ENCOUNTER — Other Ambulatory Visit: Payer: Medicare Other

## 2020-02-16 ENCOUNTER — Ambulatory Visit: Payer: Medicare Other | Admitting: Oncology

## 2020-02-16 ENCOUNTER — Ambulatory Visit: Payer: Medicare Other

## 2020-02-17 ENCOUNTER — Encounter: Payer: Self-pay | Admitting: *Deleted

## 2020-02-17 ENCOUNTER — Ambulatory Visit: Payer: Medicare Other | Admitting: Oncology

## 2020-02-17 ENCOUNTER — Ambulatory Visit: Payer: Medicare Other

## 2020-02-17 ENCOUNTER — Other Ambulatory Visit: Payer: Medicare Other

## 2020-02-17 NOTE — Progress Notes (Signed)
No show for appointments today. Scheduling message sent to reschedule for 1st available.

## 2020-02-19 ENCOUNTER — Inpatient Hospital Stay: Payer: Medicare Other

## 2020-02-21 ENCOUNTER — Other Ambulatory Visit: Payer: Self-pay | Admitting: Nurse Practitioner

## 2020-02-21 ENCOUNTER — Telehealth: Payer: Self-pay | Admitting: Oncology

## 2020-02-21 DIAGNOSIS — C189 Malignant neoplasm of colon, unspecified: Secondary | ICD-10-CM

## 2020-02-21 MED FILL — XARELTO 20 MG TABLET: 20 | 30 days supply | Qty: 30 | Fill #0

## 2020-02-21 NOTE — Telephone Encounter (Signed)
R/s appt per 2/18 sch message - pt aware of new appt date and time

## 2020-02-24 ENCOUNTER — Other Ambulatory Visit: Payer: Self-pay

## 2020-02-24 ENCOUNTER — Inpatient Hospital Stay: Payer: Medicare Other

## 2020-02-24 ENCOUNTER — Encounter: Payer: Self-pay | Admitting: Nurse Practitioner

## 2020-02-24 ENCOUNTER — Inpatient Hospital Stay: Payer: Medicare Other | Attending: Oncology

## 2020-02-24 ENCOUNTER — Inpatient Hospital Stay (HOSPITAL_BASED_OUTPATIENT_CLINIC_OR_DEPARTMENT_OTHER): Payer: Medicare Other | Admitting: Nurse Practitioner

## 2020-02-24 VITALS — BP 129/61 | HR 69 | Temp 98.0°F | Resp 17 | Ht 66.0 in | Wt 112.3 lb

## 2020-02-24 DIAGNOSIS — Z86718 Personal history of other venous thrombosis and embolism: Secondary | ICD-10-CM | POA: Diagnosis not present

## 2020-02-24 DIAGNOSIS — D701 Agranulocytosis secondary to cancer chemotherapy: Secondary | ICD-10-CM | POA: Insufficient documentation

## 2020-02-24 DIAGNOSIS — C189 Malignant neoplasm of colon, unspecified: Secondary | ICD-10-CM

## 2020-02-24 DIAGNOSIS — N2889 Other specified disorders of kidney and ureter: Secondary | ICD-10-CM | POA: Diagnosis not present

## 2020-02-24 DIAGNOSIS — C787 Secondary malignant neoplasm of liver and intrahepatic bile duct: Secondary | ICD-10-CM

## 2020-02-24 DIAGNOSIS — R918 Other nonspecific abnormal finding of lung field: Secondary | ICD-10-CM | POA: Diagnosis not present

## 2020-02-24 DIAGNOSIS — T451X5A Adverse effect of antineoplastic and immunosuppressive drugs, initial encounter: Secondary | ICD-10-CM | POA: Insufficient documentation

## 2020-02-24 DIAGNOSIS — Z95828 Presence of other vascular implants and grafts: Secondary | ICD-10-CM

## 2020-02-24 DIAGNOSIS — G893 Neoplasm related pain (acute) (chronic): Secondary | ICD-10-CM | POA: Diagnosis not present

## 2020-02-24 DIAGNOSIS — F17210 Nicotine dependence, cigarettes, uncomplicated: Secondary | ICD-10-CM | POA: Insufficient documentation

## 2020-02-24 DIAGNOSIS — Z5111 Encounter for antineoplastic chemotherapy: Secondary | ICD-10-CM | POA: Diagnosis not present

## 2020-02-24 DIAGNOSIS — Z5189 Encounter for other specified aftercare: Secondary | ICD-10-CM | POA: Insufficient documentation

## 2020-02-24 LAB — CMP (CANCER CENTER ONLY)
ALT: 14 U/L (ref 0–44)
AST: 13 U/L — ABNORMAL LOW (ref 15–41)
Albumin: 4 g/dL (ref 3.5–5.0)
Alkaline Phosphatase: 120 U/L (ref 38–126)
Anion gap: 9 (ref 5–15)
BUN: 17 mg/dL (ref 8–23)
CO2: 22 mmol/L (ref 22–32)
Calcium: 8.6 mg/dL — ABNORMAL LOW (ref 8.9–10.3)
Chloride: 107 mmol/L (ref 98–111)
Creatinine: 0.73 mg/dL (ref 0.44–1.00)
GFR, Est AFR Am: >60 mL/min (ref >60)
GFR, Estimated: >60 mL/min (ref >60)
Glucose, Bld: 92 mg/dL (ref 70–99)
Potassium: 4.1 mmol/L (ref 3.5–5.1)
Sodium: 138 mmol/L (ref 135–145)
Total Bilirubin: 0.5 mg/dL (ref 0.3–1.2)
Total Protein: 6.9 g/dL (ref 6.5–8.1)

## 2020-02-24 LAB — CBC WITH DIFFERENTIAL (CANCER CENTER ONLY)
Abs Immature Granulocytes: 0.03 10*3/uL (ref 0.00–0.07)
Basophils Absolute: 0.1 10*3/uL (ref 0.0–0.1)
Basophils Relative: 1 %
Eosinophils Absolute: 0.6 10*3/uL — ABNORMAL HIGH (ref 0.0–0.5)
Eosinophils Relative: 7 %
HCT: 38.1 % (ref 36.0–46.0)
Hemoglobin: 12.8 g/dL (ref 12.0–15.0)
Immature Granulocytes: 0 %
Lymphocytes Relative: 18 %
Lymphs Abs: 1.6 10*3/uL (ref 0.7–4.0)
MCH: 33.2 pg (ref 26.0–34.0)
MCHC: 33.6 g/dL (ref 30.0–36.0)
MCV: 99 fL (ref 80.0–100.0)
Monocytes Absolute: 0.6 10*3/uL (ref 0.1–1.0)
Monocytes Relative: 7 %
Neutro Abs: 6.1 10*3/uL (ref 1.7–7.7)
Neutrophils Relative %: 67 %
Platelet Count: 207 10*3/uL (ref 150–400)
RBC: 3.85 MIL/uL — ABNORMAL LOW (ref 3.87–5.11)
RDW: 15.2 % (ref 11.5–15.5)
WBC Count: 9 10*3/uL (ref 4.0–10.5)
nRBC: 0 % (ref 0.0–0.2)

## 2020-02-24 MED ORDER — SODIUM CHLORIDE 0.9 % IV SOLN
Freq: Once | INTRAVENOUS | Status: AC
  Filled 2020-02-24: qty 250

## 2020-02-24 MED ORDER — PALONOSETRON HCL INJECTION 0.25 MG/5ML
INTRAVENOUS | Status: AC
  Filled 2020-02-24: qty 5

## 2020-02-24 MED ORDER — ATROPINE SULFATE 0.4 MG/ML IJ SOLN
INTRAMUSCULAR | Status: AC
  Filled 2020-02-24: qty 1

## 2020-02-24 MED ORDER — IRINOTECAN HCL CHEMO INJECTION 100 MG/5ML
180.0000 mg/m2 | Freq: Once | INTRAVENOUS | Status: AC
  Administered 2020-02-24: 260 mg via INTRAVENOUS
  Filled 2020-02-24: qty 13

## 2020-02-24 MED ORDER — SODIUM CHLORIDE 0.9% FLUSH
10.0000 mL | INTRAVENOUS | Status: DC | PRN
  Administered 2020-02-24: 10 mL via INTRAVENOUS
  Filled 2020-02-24: qty 10

## 2020-02-24 MED ORDER — SODIUM CHLORIDE 0.9 % IV SOLN
2400.0000 mg/m2 | INTRAVENOUS | Status: DC
  Administered 2020-02-24: 3550 mg via INTRAVENOUS
  Filled 2020-02-24: qty 71

## 2020-02-24 MED ORDER — DEXAMETHASONE SODIUM PHOSPHATE 10 MG/ML IJ SOLN
10.0000 mg | Freq: Once | INTRAMUSCULAR | Status: AC
  Administered 2020-02-24: 10 mg via INTRAVENOUS

## 2020-02-24 MED ORDER — PALONOSETRON HCL INJECTION 0.25 MG/5ML
0.2500 mg | Freq: Once | INTRAVENOUS | Status: AC
  Administered 2020-02-24: 0.25 mg via INTRAVENOUS

## 2020-02-24 MED ORDER — DEXAMETHASONE SODIUM PHOSPHATE 10 MG/ML IJ SOLN
INTRAMUSCULAR | Status: AC
  Filled 2020-02-24: qty 1

## 2020-02-24 MED ORDER — FLUOROURACIL CHEMO INJECTION 2.5 GM/50ML
400.0000 mg/m2 | Freq: Once | INTRAVENOUS | Status: AC
  Administered 2020-02-24: 600 mg via INTRAVENOUS
  Filled 2020-02-24: qty 12

## 2020-02-24 MED ORDER — ATROPINE SULFATE 0.4 MG/ML IJ SOLN
0.4000 mg | Freq: Once | INTRAMUSCULAR | Status: AC | PRN
  Administered 2020-02-24: 0.4 mg via INTRAVENOUS

## 2020-02-24 MED ORDER — LEUCOVORIN CALCIUM INJECTION 350 MG
400.0000 mg/m2 | Freq: Once | INTRAVENOUS | Status: AC
  Administered 2020-02-24: 14:00:00 588 mg via INTRAVENOUS
  Filled 2020-02-24: qty 29.4

## 2020-02-24 NOTE — Progress Notes (Signed)
[place] OFFICE PROGRESS NOTE   Diagnosis: Colon cancer  INTERVAL HISTORY:   Yolanda Watson returns as scheduled.  She completed a cycle of FOLFIRI 01/26/2020.  She denies nausea/vomiting.  No mouth sores.  No diarrhea.  Objective:  Vital signs in last 24 hours:  Blood pressure 129/61, pulse 69, temperature 98 F (36.7 C), resp. rate 17, height _0  (1.676 m), weight 112 lb 4.8 oz (50.9 kg), SpO2 100 %.    HEENT: No thrush or ulcers. Lymphatics: Approximate 1 cm firm left inguinal lymph node. GI: Abdomen soft and nontender.  No hepatomegaly. Vascular: No leg edema. Skin: Palms without erythema. Port-A-Cath without erythema.   Lab Results:  Lab Results  Component Value Date   WBC 9.0 02/24/2020   HGB 12.8 02/24/2020   HCT 38.1 02/24/2020   MCV 99.0 02/24/2020   PLT 207 02/24/2020   NEUTROABS 6.1 02/24/2020    Imaging:  No results found.  Medications: I have reviewed the patient's current medications.  Assessment/Plan: 1. Metastatic colon cancer presenting with an Abdominal/pelvic mass  2. Colon cancerdiagnosed in [place] in 2016-pathology report dated 02/15/2015-invasive adenocarcinoma, well-differentiated with prominent mucinous features; microscopic tumor extension into mesenteric adipose tissue and focally involving visceral peritoneum; radial and mucosal margins negative; lymph-vascular invasion withsuspicious foci identified;perineural invasion not identified; 16 negative lymph nodes; tumor deposits not identified; pT4apN0;MSI stable  ? CT 04/22/2017 revealed a heterogenous anterior pelvic wall mass, right inguinal mass, top normal sized retroperitoneal and iliac nodes, abutment of the anterior dome of the bladder and a loop of small bowel ? 04/29/2017 biopsy of anterior abdominal/pelvic wall mass-acellular mucin dissecting fibrous stroma ? Cycle 1 FOLFOX 05/15/2017 ? Cycle 2 FOLFOX 05/28/2017 ? Cycle 3 FOLFOX with Avastin on  06/11/2017 ? Cycle 4 FOLFOX/Avastin 06/25/2017 ? Cycle 5 FOLFOX/Avastin 07/10/2017, Neulasta added ? Restaging CT 07/21/2017-mild decrease in the size of a right inguinal node and the anterior abdominal wall mass ? Radiation to the abdominal wall mass 08/18/2017-09/03/2017 ? Status post excision of abdominal wall mass 10/03/2017--pathology showed adenocarcinoma with excessive extracellular mucin. Margins negative for tumor. Right inguinal wall mass also excised showing metastatic adenocarcinoma with excessive extracellular mucin in 1 of 8 lymph nodes. ? CT abdomen/pelvis 06/16/2019-new right hepatic lobe lesion measuring 2.7 x 1.7 cm. Hypoattenuation within the pancreatic uncinate process measuring 1.5 x 1.6 cm. Mild left adrenal thickening. Upper pole left renal mass 3.0 x 3.6 cm. Infiltrative hypoenhancement involving the lower pole left kidney. Nonocclusive thrombus within the infrarenal IVC. Necrotic adenopathy within the small bowel mesentery, new. New necrotic left inguinal nodes. Lytic lesion within and adjacent the anterior side of the L3 vertebral body. Contiguous mass within the left psoas measuring 4.6 x 3.4 cm. ? Cycle 1 FOLFIRI 06/23/2019 ? Cycle 2 FOLFIRI 07/08/2019, Udenyca added ? Cycle 3 FOLFIRI 07/22/2019 ? Cycle 4 FOLFIRI 08/04/2019 ? Cycle 5 FOLFIRI 08/18/2019 ? CTs 09/08/2019-3.4 x 2.9 cm metastasis in segment 7 liver, previously 2.7 x 1.7 cm. 14 mm lesion/implant along the inferior right hepatic lobe, possibly new or possibly related to the prior peritoneal implant along the right paracolic gutter. 4 x 6 mm subpleural nodule right lower lobe not clearly evident on the prior study. Near complete resolution of prior heterogeneous enhancement involving the bilateral kidneys which likely reflected pyelonephritis. Near complete resolution of prior lymphadenopathy along with small bowel mesentery. Mild left inguinal lymphadenopathy measuring up to 14 mm, previously 15 mm. Lytic  metastasis at L3 with associated soft tissue metastasis involving the  left psoas muscle improved. ? Cycle 6 FOLFIRI 09/16/2019, Udenyca ? Cycle 7 FOLFIRI 09/30/2019 ? Treatment break ? Cycle 8 FOLFIRI 01/06/2020 ? Cycle 9 FOLFIRI 01/26/2020 ? Cycle 10 FOLFIRI 02/24/2020   3. Ongoing tobacco and alcohol use  4. Mild neutropenia related to chemotherapy 07/10/2017. Neulasta added.  5.Admission January 2019 with an abdominal wall abscess and pubis osteomyelitis, status post debridement and antibiotics  6.Pain secondary to #1. 7.CT 06/16/2019 with nonocclusive thrombus infrarenal IVC. Anticoagulation initiated. 8.Left patella fracture following a fall 08/20/2019  Disposition: Yolanda Watson appears stable.  She has completed 2 cycles of FOLFIRI since resuming treatment.  She is tolerating chemotherapy well.  Plan to proceed with the third cycle today as scheduled.  We reviewed the CBC and chemistry panel from today.  Labs adequate to proceed.  She will return for lab, follow-up, cycle 4 FOLFIRI in 2 weeks.  She will contact the office in the interim with any problems.    Ned Card ANP/GNP-BC   02/24/2020  12:18 PM

## 2020-02-24 NOTE — Patient Instructions (Signed)
[facility] Discharge Instructions for Patients Receiving Chemotherapy  Today you received the following chemotherapy agents: Irinotecan, leucovorin, 5FU  To help prevent nausea and vomiting after your treatment, we encourage you to take your nausea medication as directed.   If you develop nausea and vomiting that is not controlled by your nausea medication, call the clinic.   BELOW ARE SYMPTOMS THAT SHOULD BE REPORTED IMMEDIATELY:  *FEVER GREATER THAN 100.5 F  *CHILLS WITH OR WITHOUT FEVER  NAUSEA AND VOMITING THAT IS NOT CONTROLLED WITH YOUR NAUSEA MEDICATION  *UNUSUAL SHORTNESS OF BREATH  *UNUSUAL BRUISING OR BLEEDING  TENDERNESS IN MOUTH AND THROAT WITH OR WITHOUT PRESENCE OF ULCERS  *URINARY PROBLEMS  *BOWEL PROBLEMS  UNUSUAL RASH Items with * indicate a potential emergency and should be followed up as soon as possible.  Feel free to call the clinic should you have any questions or concerns. The clinic phone number is [phone].  Please show the CHEMO ALERT CARD at check-in to the Emergency Department and triage nurse.   

## 2020-02-24 NOTE — Patient Instructions (Signed)

## 2020-02-26 ENCOUNTER — Inpatient Hospital Stay: Payer: Medicare Other

## 2020-02-26 ENCOUNTER — Other Ambulatory Visit: Payer: Self-pay

## 2020-02-26 VITALS — BP 135/98 | HR 72 | Temp 97.3°F | Resp 18

## 2020-02-26 DIAGNOSIS — G893 Neoplasm related pain (acute) (chronic): Secondary | ICD-10-CM | POA: Diagnosis not present

## 2020-02-26 DIAGNOSIS — Z5189 Encounter for other specified aftercare: Secondary | ICD-10-CM | POA: Diagnosis not present

## 2020-02-26 DIAGNOSIS — C787 Secondary malignant neoplasm of liver and intrahepatic bile duct: Secondary | ICD-10-CM | POA: Diagnosis not present

## 2020-02-26 DIAGNOSIS — C189 Malignant neoplasm of colon, unspecified: Secondary | ICD-10-CM

## 2020-02-26 DIAGNOSIS — D701 Agranulocytosis secondary to cancer chemotherapy: Secondary | ICD-10-CM | POA: Diagnosis not present

## 2020-02-26 DIAGNOSIS — Z5111 Encounter for antineoplastic chemotherapy: Secondary | ICD-10-CM | POA: Diagnosis not present

## 2020-02-26 MED ORDER — HEPARIN SOD (PORK) LOCK FLUSH 100 UNIT/ML IV SOLN
500.0000 [IU] | Freq: Once | INTRAVENOUS | Status: AC | PRN
  Administered 2020-02-26: 500 [IU]
  Filled 2020-02-26: qty 5

## 2020-02-26 MED ORDER — PEGFILGRASTIM-CBQV 6 MG/0.6ML ~~LOC~~ SOSY
6.0000 mg | PREFILLED_SYRINGE | Freq: Once | SUBCUTANEOUS | Status: AC
  Administered 2020-02-26: 13:00:00 6 mg via SUBCUTANEOUS

## 2020-02-26 MED ORDER — SODIUM CHLORIDE 0.9% FLUSH
10.0000 mL | INTRAVENOUS | Status: DC | PRN
  Administered 2020-02-26: 10 mL
  Filled 2020-02-26: qty 10

## 2020-02-28 ENCOUNTER — Telehealth: Payer: Self-pay | Admitting: Oncology

## 2020-02-28 NOTE — Telephone Encounter (Signed)
Scheduled per 2/25 los. Called and spoke with patient. Confirmed appt

## 2020-03-02 ENCOUNTER — Ambulatory Visit: Payer: Medicare Other | Attending: Internal Medicine

## 2020-03-02 DIAGNOSIS — Z23 Encounter for immunization: Secondary | ICD-10-CM

## 2020-03-02 NOTE — Progress Notes (Signed)
   Covid-19 Vaccination Clinic  Name:  Yolanda Watson    MRN: [id number] DOB: Oct 30, 1951  03/02/2020  Yolanda Watson was observed post Covid-19 immunization for 15 minutes without incident. She was provided with Vaccine Information Sheet and instruction to access the V-Safe system.   Yolanda Watson was instructed to call 911 with any severe reactions post vaccine: Marland Kitchen Difficulty breathing  . Swelling of face and throat  . A fast heartbeat  . A bad rash all over body  . Dizziness and weakness   Immunizations Administered    Name Date Dose VIS Date Route   Pfizer COVID-19 Vaccine 03/02/2020  2:49 PM 0.3 mL 12/10/2019 Intramuscular   Manufacturer: [place]   Lot: [id number]   [place]: [id number]

## 2020-03-12 ENCOUNTER — Other Ambulatory Visit: Payer: Self-pay | Admitting: Oncology

## 2020-03-16 ENCOUNTER — Inpatient Hospital Stay: Payer: Medicare Other

## 2020-03-16 ENCOUNTER — Inpatient Hospital Stay: Payer: Medicare Other | Attending: Oncology | Admitting: Nurse Practitioner

## 2020-03-16 ENCOUNTER — Encounter: Payer: Self-pay | Admitting: Nurse Practitioner

## 2020-03-16 ENCOUNTER — Other Ambulatory Visit: Payer: Self-pay

## 2020-03-16 VITALS — BP 130/79 | HR 75 | Temp 98.3°F | Resp 17 | Ht 66.0 in | Wt 111.5 lb

## 2020-03-16 DIAGNOSIS — R590 Localized enlarged lymph nodes: Secondary | ICD-10-CM | POA: Insufficient documentation

## 2020-03-16 DIAGNOSIS — C787 Secondary malignant neoplasm of liver and intrahepatic bile duct: Secondary | ICD-10-CM

## 2020-03-16 DIAGNOSIS — Z79899 Other long term (current) drug therapy: Secondary | ICD-10-CM | POA: Diagnosis not present

## 2020-03-16 DIAGNOSIS — C189 Malignant neoplasm of colon, unspecified: Secondary | ICD-10-CM

## 2020-03-16 DIAGNOSIS — Z95828 Presence of other vascular implants and grafts: Secondary | ICD-10-CM

## 2020-03-16 DIAGNOSIS — Z5111 Encounter for antineoplastic chemotherapy: Secondary | ICD-10-CM | POA: Insufficient documentation

## 2020-03-16 LAB — CMP (CANCER CENTER ONLY)
ALT: 11 U/L (ref 0–44)
AST: 13 U/L — ABNORMAL LOW (ref 15–41)
Albumin: 4.2 g/dL (ref 3.5–5.0)
Alkaline Phosphatase: 120 U/L (ref 38–126)
Anion gap: 11 (ref 5–15)
BUN: 23 mg/dL (ref 8–23)
CO2: 23 mmol/L (ref 22–32)
Calcium: 9 mg/dL (ref 8.9–10.3)
Chloride: 105 mmol/L (ref 98–111)
Creatinine: 0.8 mg/dL (ref 0.44–1.00)
GFR, Est AFR Am: >60 mL/min (ref >60)
GFR, Estimated: >60 mL/min (ref >60)
Glucose, Bld: 94 mg/dL (ref 70–99)
Potassium: 4.1 mmol/L (ref 3.5–5.1)
Sodium: 139 mmol/L (ref 135–145)
Total Bilirubin: 0.4 mg/dL (ref 0.3–1.2)
Total Protein: 7.1 g/dL (ref 6.5–8.1)

## 2020-03-16 LAB — CEA (IN HOUSE-CHCC): CEA (CHCC-In House): 6.52 ng/mL — ABNORMAL HIGH (ref 0.00–5.00)

## 2020-03-16 LAB — CBC WITH DIFFERENTIAL (CANCER CENTER ONLY)
Abs Immature Granulocytes: 0.04 10*3/uL (ref 0.00–0.07)
Basophils Absolute: 0.1 10*3/uL (ref 0.0–0.1)
Basophils Relative: 1 %
Eosinophils Absolute: 0.4 10*3/uL (ref 0.0–0.5)
Eosinophils Relative: 4 %
HCT: 42.2 % (ref 36.0–46.0)
Hemoglobin: 13.9 g/dL (ref 12.0–15.0)
Immature Granulocytes: 0 %
Lymphocytes Relative: 16 %
Lymphs Abs: 1.6 10*3/uL (ref 0.7–4.0)
MCH: 33.4 pg (ref 26.0–34.0)
MCHC: 32.9 g/dL (ref 30.0–36.0)
MCV: 101.4 fL — ABNORMAL HIGH (ref 80.0–100.0)
Monocytes Absolute: 0.7 10*3/uL (ref 0.1–1.0)
Monocytes Relative: 7 %
Neutro Abs: 7.3 10*3/uL (ref 1.7–7.7)
Neutrophils Relative %: 72 %
Platelet Count: 155 10*3/uL (ref 150–400)
RBC: 4.16 MIL/uL (ref 3.87–5.11)
RDW: 15.6 % — ABNORMAL HIGH (ref 11.5–15.5)
WBC Count: 10.1 10*3/uL (ref 4.0–10.5)
nRBC: 0 % (ref 0.0–0.2)

## 2020-03-16 MED ORDER — SODIUM CHLORIDE 0.9 % IV SOLN
2400.0000 mg/m2 | INTRAVENOUS | Status: DC
  Administered 2020-03-16: 3550 mg via INTRAVENOUS
  Filled 2020-03-16: qty 71

## 2020-03-16 MED ORDER — DEXAMETHASONE SODIUM PHOSPHATE 10 MG/ML IJ SOLN
INTRAMUSCULAR | Status: AC
  Filled 2020-03-16: qty 1

## 2020-03-16 MED ORDER — ATROPINE SULFATE 0.4 MG/ML IJ SOLN
0.4000 mg | Freq: Once | INTRAMUSCULAR | Status: AC | PRN
  Administered 2020-03-16: 0.4 mg via INTRAVENOUS

## 2020-03-16 MED ORDER — PALONOSETRON HCL INJECTION 0.25 MG/5ML
INTRAVENOUS | Status: AC
  Filled 2020-03-16: qty 5

## 2020-03-16 MED ORDER — FLUOROURACIL CHEMO INJECTION 2.5 GM/50ML
400.0000 mg/m2 | Freq: Once | INTRAVENOUS | Status: AC
  Administered 2020-03-16: 600 mg via INTRAVENOUS
  Filled 2020-03-16: qty 12

## 2020-03-16 MED ORDER — DEXAMETHASONE SODIUM PHOSPHATE 10 MG/ML IJ SOLN
10.0000 mg | Freq: Once | INTRAMUSCULAR | Status: AC
  Administered 2020-03-16: 10 mg via INTRAVENOUS

## 2020-03-16 MED ORDER — PALONOSETRON HCL INJECTION 0.25 MG/5ML
0.2500 mg | Freq: Once | INTRAVENOUS | Status: AC
  Administered 2020-03-16: 0.25 mg via INTRAVENOUS

## 2020-03-16 MED ORDER — SODIUM CHLORIDE 0.9% FLUSH
10.0000 mL | INTRAVENOUS | Status: DC | PRN
  Administered 2020-03-16: 10 mL via INTRAVENOUS
  Filled 2020-03-16: qty 10

## 2020-03-16 MED ORDER — LEUCOVORIN CALCIUM INJECTION 350 MG
400.0000 mg/m2 | Freq: Once | INTRAVENOUS | Status: AC
  Administered 2020-03-16: 588 mg via INTRAVENOUS
  Filled 2020-03-16: qty 29.4

## 2020-03-16 MED ORDER — HEPARIN SOD (PORK) LOCK FLUSH 100 UNIT/ML IV SOLN
500.0000 [IU] | Freq: Once | INTRAVENOUS | Status: DC | PRN
  Filled 2020-03-16: qty 5

## 2020-03-16 MED ORDER — ATROPINE SULFATE 0.4 MG/ML IJ SOLN
INTRAMUSCULAR | Status: AC
  Filled 2020-03-16: qty 1

## 2020-03-16 MED ORDER — IRINOTECAN HCL CHEMO INJECTION 100 MG/5ML
180.0000 mg/m2 | Freq: Once | INTRAVENOUS | Status: AC
  Administered 2020-03-16: 260 mg via INTRAVENOUS
  Filled 2020-03-16: qty 13

## 2020-03-16 MED ORDER — SODIUM CHLORIDE 0.9 % IV SOLN
Freq: Once | INTRAVENOUS | Status: AC
  Filled 2020-03-16: qty 250

## 2020-03-16 NOTE — Progress Notes (Signed)
[place] OFFICE PROGRESS NOTE   Diagnosis: Colon cancer  INTERVAL HISTORY:   Yolanda Watson returns as scheduled.  She completed a cycle of FOLFIRI 02/24/2020.  She notes that she feels "puny" for a couple days after each treatment.  She denies nausea/vomiting.  No mouth sores.  No diarrhea.  No bleeding.  She has received her first Covid vaccine.  Objective:  Vital signs in last 24 hours:  Blood pressure 130/79, pulse 75, temperature 98.3 F (36.8 C), temperature source Temporal, resp. rate 17, height '5\' 6"'$  (1.676 m), weight 111 lb 8 oz (50.6 kg), SpO2 99 %.    HEENT: No thrush or ulcers. Lymphatics: Stable firm left inguinal lymph node. GI: Abdomen soft and nontender.  No hepatomegaly. Vascular: No leg edema. Neuro: Alert and oriented. Skin: Palms without erythema. Port-A-Cath without erythema.   Lab Results:  Lab Results  Component Value Date   WBC 10.1 03/16/2020   HGB 13.9 03/16/2020   HCT 42.2 03/16/2020   MCV 101.4 (H) 03/16/2020   PLT 155 03/16/2020   NEUTROABS 7.3 03/16/2020    Imaging:  No results found.  Medications: I have reviewed the patient's current medications.  Assessment/Plan: 1. Metastatic colon cancer presenting with an Abdominal/pelvic mass  2. Colon cancerdiagnosed in [place] in 2016-pathology report dated 02/15/2015-invasive adenocarcinoma, well-differentiated with prominent mucinous features; microscopic tumor extension into mesenteric adipose tissue and focally involving visceral peritoneum; radial and mucosal margins negative; lymph-vascular invasion withsuspicious foci identified;perineural invasion not identified; 16 negative lymph nodes; tumor deposits not identified; pT4apN0;MSI stable  ? CT 04/22/2017 revealed a heterogenous anterior pelvic wall mass, right inguinal mass, top normal sized retroperitoneal and iliac nodes, abutment of the anterior dome of the bladder and a loop of small bowel ? 04/29/2017 biopsy  of anterior abdominal/pelvic wall mass-acellular mucin dissecting fibrous stroma ? Cycle 1 FOLFOX 05/15/2017 ? Cycle 2 FOLFOX 05/28/2017 ? Cycle 3 FOLFOX with Avastin on 06/11/2017 ? Cycle 4 FOLFOX/Avastin 06/25/2017 ? Cycle 5 FOLFOX/Avastin 07/10/2017, Neulasta added ? Restaging CT 07/21/2017-mild decrease in the size of a right inguinal node and the anterior abdominal wall mass ? Radiation to the abdominal wall mass 08/18/2017-09/03/2017 ? Status post excision of abdominal wall mass 10/03/2017--pathology showed adenocarcinoma with excessive extracellular mucin. Margins negative for tumor. Right inguinal wall mass also excised showing metastatic adenocarcinoma with excessive extracellular mucin in 1 of 8 lymph nodes. ? CT abdomen/pelvis 06/16/2019-new right hepatic lobe lesion measuring 2.7 x 1.7 cm. Hypoattenuation within the pancreatic uncinate process measuring 1.5 x 1.6 cm. Mild left adrenal thickening. Upper pole left renal mass 3.0 x 3.6 cm. Infiltrative hypoenhancement involving the lower pole left kidney. Nonocclusive thrombus within the infrarenal IVC. Necrotic adenopathy within the small bowel mesentery, new. New necrotic left inguinal nodes. Lytic lesion within and adjacent the anterior side of the L3 vertebral body. Contiguous mass within the left psoas measuring 4.6 x 3.4 cm. ? Cycle 1 FOLFIRI 06/23/2019 ? Cycle 2 FOLFIRI 07/08/2019, Udenyca added ? Cycle 3 FOLFIRI 07/22/2019 ? Cycle 4 FOLFIRI 08/04/2019 ? Cycle 5 FOLFIRI 08/18/2019 ? CTs 09/08/2019-3.4 x 2.9 cm metastasis in segment 7 liver, previously 2.7 x 1.7 cm. 14 mm lesion/implant along the inferior right hepatic lobe, possibly new or possibly related to the prior peritoneal implant along the right paracolic gutter. 4 x 6 mm subpleural nodule right lower lobe not clearly evident on the prior study. Near complete resolution of prior heterogeneous enhancement involving the bilateral kidneys which likely reflected  pyelonephritis. Near complete resolution  of prior lymphadenopathy along with small bowel mesentery. Mild left inguinal lymphadenopathy measuring up to 14 mm, previously 15 mm. Lytic metastasis at L3 with associated soft tissue metastasis involving the left psoas muscle improved. ? Cycle 6 FOLFIRI 09/16/2019, Udenyca ? Cycle 7 FOLFIRI 09/30/2019 ? Treatment break ? Cycle 8 FOLFIRI 01/06/2020 ? Cycle 9 FOLFIRI 01/26/2020 ? Cycle 10 FOLFIRI 02/24/2020 ? Cycle 11 FOLFIRI 03/16/2020   3. Ongoing tobacco and alcohol use  4. Mild neutropenia related to chemotherapy 07/10/2017. Neulasta added.  5.Admission January 2019 with an abdominal wall abscess and pubis osteomyelitis, status post debridement and antibiotics  6.Pain secondary to #1. 7.CT 06/16/2019 with nonocclusive thrombus infrarenal IVC. Anticoagulation initiated. 8.Left patella fracture following a fall 08/20/2019   Disposition: [name] appears stable.  She has completed 3 cycles of FOLFIRI since resuming following a treatment break.  She is tolerating chemotherapy well.  Plan to proceed with cycle 4 today.  Plan for restaging CTs after she has completed 5 cycles.  We reviewed the CBC from today.  Counts adequate to proceed with treatment.  She will return for lab, follow-up, cycle 5 FOLFIRI in 3 weeks.  She will contact the office in the interim with any problems.    Ned Card ANP/GNP-BC   03/16/2020  10:41 AM

## 2020-03-16 NOTE — Patient Instructions (Signed)
COVID-19 Vaccine Information can be found at: [email or web address] For questions related to vaccine distribution or appointments, please email [email or web address] or call [phone].   [facility] Discharge Instructions for Patients Receiving Chemotherapy  Today you received the following chemotherapy agents: Irinotecan (Camptosar), Leucovorin, Fluorouracil (Adrucil, 5-FU)  To help prevent nausea and vomiting after your treatment, we encourage you to take your nausea medication as directed by your provider.    If you develop nausea and vomiting that is not controlled by your nausea medication, call the clinic.   BELOW ARE SYMPTOMS THAT SHOULD BE REPORTED IMMEDIATELY:  *FEVER GREATER THAN 100.5 F  *CHILLS WITH OR WITHOUT FEVER  NAUSEA AND VOMITING THAT IS NOT CONTROLLED WITH YOUR NAUSEA MEDICATION  *UNUSUAL SHORTNESS OF BREATH  *UNUSUAL BRUISING OR BLEEDING  TENDERNESS IN MOUTH AND THROAT WITH OR WITHOUT PRESENCE OF ULCERS  *URINARY PROBLEMS  *BOWEL PROBLEMS  UNUSUAL RASH Items with * indicate a potential emergency and should be followed up as soon as possible.  Feel free to call the clinic should you have any questions or concerns. The clinic phone number is [phone].  Please show the [place] at check-in to the Emergency Department and triage nurse.  Coronavirus (COVID-19) Are you at risk?  Are you at risk for the Coronavirus (COVID-19)?  To be considered HIGH RISK for Coronavirus (COVID-19), you have to meet the following criteria:  . Traveled to [place], [place], [place], [place] or [place]; or in the [place] to [place], [place], [place], or [place]; and have fever, cough, and shortness of breath within the last 2 weeks of travel OR . Been in close contact with a person diagnosed with COVID-19 within the last 2 weeks and have fever, cough, and shortness of breath . IF  YOU DO NOT MEET THESE CRITERIA, YOU ARE CONSIDERED LOW RISK FOR COVID-19.  What to do if you are HIGH RISK for COVID-19?  Marland Kitchen If you are having a medical emergency, call 911. . Seek medical care right away. Before you go to a doctor's office, urgent care or emergency department, call ahead and tell them about your recent travel, contact with someone diagnosed with COVID-19, and your symptoms. You should receive instructions from your physician's office regarding next steps of care.  . When you arrive at healthcare provider, tell the healthcare staff immediately you have returned from visiting [place], [place], [place], [place] or [place]; or traveled in the [place] to [place], [place], [place], or [place]; in the last two weeks or you have been in close contact with a person diagnosed with COVID-19 in the last 2 weeks.   . Tell the health care staff about your symptoms: fever, cough and shortness of breath. . After you have been seen by a medical provider, you will be either: o Tested for (COVID-19) and discharged home on quarantine except to seek medical care if symptoms worsen, and asked to  - Stay home and avoid contact with others until you get your results (4-5 days)  - Avoid travel on public transportation if possible (such as bus, train, or airplane) or o Sent to the Emergency Department by EMS for evaluation, COVID-19 testing, and possible admission depending on your condition and test results.  What to do if you are LOW RISK for COVID-19?  Reduce your risk of any infection by using the same precautions used for avoiding the common cold or flu:  Marland Kitchen Wash your hands often with soap  and warm water for at least 20 seconds.  If soap and water are not readily available, use an alcohol-based hand sanitizer with at least 60% alcohol.  . If coughing or sneezing, cover your mouth and nose by coughing or sneezing into the elbow areas of your shirt or coat, into a tissue or into your sleeve  (not your hands). . Avoid shaking hands with others and consider head nods or verbal greetings only. . Avoid touching your eyes, nose, or mouth with unwashed hands.  . Avoid close contact with people who are sick. . Avoid places or events with large numbers of people in one location, like concerts or sporting events. . Carefully consider travel plans you have or are making. . If you are planning any travel outside or inside the [place], visit the CDC's Travelers' Health webpage for the latest health notices. . If you have some symptoms but not all symptoms, continue to monitor at home and seek medical attention if your symptoms worsen. . If you are having a medical emergency, call 911.   [place] / e-Visit: [email or web address]         [facility]: [facility]: [id number]                   [facility]: [phone]

## 2020-03-18 ENCOUNTER — Inpatient Hospital Stay: Payer: Medicare Other

## 2020-03-18 ENCOUNTER — Other Ambulatory Visit: Payer: Self-pay

## 2020-03-18 VITALS — BP 136/71 | HR 73 | Temp 98.0°F | Resp 18

## 2020-03-18 DIAGNOSIS — C189 Malignant neoplasm of colon, unspecified: Secondary | ICD-10-CM | POA: Diagnosis not present

## 2020-03-18 DIAGNOSIS — R590 Localized enlarged lymph nodes: Secondary | ICD-10-CM | POA: Diagnosis not present

## 2020-03-18 DIAGNOSIS — Z5111 Encounter for antineoplastic chemotherapy: Secondary | ICD-10-CM | POA: Diagnosis not present

## 2020-03-18 DIAGNOSIS — Z79899 Other long term (current) drug therapy: Secondary | ICD-10-CM | POA: Diagnosis not present

## 2020-03-18 DIAGNOSIS — C787 Secondary malignant neoplasm of liver and intrahepatic bile duct: Secondary | ICD-10-CM

## 2020-03-18 MED ORDER — PEGFILGRASTIM-CBQV 6 MG/0.6ML ~~LOC~~ SOSY
6.0000 mg | PREFILLED_SYRINGE | Freq: Once | SUBCUTANEOUS | Status: AC
  Administered 2020-03-18: 6 mg via SUBCUTANEOUS

## 2020-03-18 MED ORDER — HEPARIN SOD (PORK) LOCK FLUSH 100 UNIT/ML IV SOLN
500.0000 [IU] | Freq: Once | INTRAVENOUS | Status: AC | PRN
  Administered 2020-03-18: 500 [IU]
  Filled 2020-03-18: qty 5

## 2020-03-18 MED ORDER — SODIUM CHLORIDE 0.9% FLUSH
10.0000 mL | INTRAVENOUS | Status: DC | PRN
  Administered 2020-03-18: 10 mL
  Filled 2020-03-18: qty 10

## 2020-03-20 ENCOUNTER — Telehealth: Payer: Self-pay | Admitting: Oncology

## 2020-03-20 NOTE — Telephone Encounter (Signed)
Scheduled per los. Called and spoke with patient. Confirmed appt 

## 2020-03-24 MED FILL — XARELTO 20 MG TABLET: 20 | 30 days supply | Qty: 30 | Fill #1

## 2020-03-29 ENCOUNTER — Ambulatory Visit: Payer: Medicare Other | Attending: Internal Medicine

## 2020-03-29 DIAGNOSIS — Z23 Encounter for immunization: Secondary | ICD-10-CM

## 2020-03-29 NOTE — Progress Notes (Signed)
   Covid-19 Vaccination Clinic  Name:  Yolanda Watson    MRN: [id number] DOB: 23-Mar-1951  03/29/2020  Ms. Like was observed post Covid-19 immunization for 30 minutes based on pre-vaccination screening without incident. She was provided with Vaccine Information Sheet and instruction to access the V-Safe system.   [name] was instructed to call 911 with any severe reactions post vaccine: Marland Kitchen Difficulty breathing  . Swelling of face and throat  . A fast heartbeat  . A bad rash all over body  . Dizziness and weakness   Immunizations Administered    Name Date Dose VIS Date Route   Pfizer COVID-19 Vaccine 03/29/2020 10:45 AM 0.3 mL 12/10/2019 Intramuscular   Manufacturer: [employer]   Lot: [id number]   [place]: [id number]

## 2020-03-31 NOTE — Progress Notes (Signed)

## 2020-04-01 ENCOUNTER — Other Ambulatory Visit: Payer: Self-pay | Admitting: Oncology

## 2020-04-06 ENCOUNTER — Inpatient Hospital Stay: Payer: Medicare Other

## 2020-04-06 ENCOUNTER — Inpatient Hospital Stay: Payer: Medicare Other | Admitting: Oncology

## 2020-04-08 ENCOUNTER — Inpatient Hospital Stay: Payer: Medicare Other

## 2020-04-10 ENCOUNTER — Telehealth: Payer: Self-pay | Admitting: *Deleted

## 2020-04-10 NOTE — Telephone Encounter (Signed)
Called to report constipation w/cramping despite MiraLax daily. Vomited x 1 today. Asking if she can try a OTC Fleet enema? Instructed OK to try this and add colace bid. Stick w/liquid diet until good BM and nausea resolves. Call back if not better.

## 2020-04-11 ENCOUNTER — Emergency Department (HOSPITAL_COMMUNITY): Payer: Medicare Other

## 2020-04-11 ENCOUNTER — Encounter (HOSPITAL_COMMUNITY): Payer: Self-pay

## 2020-04-11 ENCOUNTER — Other Ambulatory Visit: Payer: Self-pay

## 2020-04-11 ENCOUNTER — Telehealth: Payer: Self-pay | Admitting: *Deleted

## 2020-04-11 ENCOUNTER — Inpatient Hospital Stay (HOSPITAL_COMMUNITY)
Admission: EM | Admit: 2020-04-11 | Discharge: 2020-05-04 | DRG: 329 | Disposition: A | Discharge status: Home Health | Payer: Medicare Other | Attending: Internal Medicine | Admitting: Internal Medicine

## 2020-04-11 DIAGNOSIS — J9601 Acute respiratory failure with hypoxia: Secondary | ICD-10-CM | POA: Diagnosis not present

## 2020-04-11 DIAGNOSIS — R0902 Hypoxemia: Secondary | ICD-10-CM | POA: Diagnosis not present

## 2020-04-11 DIAGNOSIS — Z66 Do not resuscitate: Secondary | ICD-10-CM | POA: Diagnosis present

## 2020-04-11 DIAGNOSIS — R64 Cachexia: Secondary | ICD-10-CM | POA: Diagnosis present

## 2020-04-11 DIAGNOSIS — K567 Ileus, unspecified: Secondary | ICD-10-CM | POA: Diagnosis not present

## 2020-04-11 DIAGNOSIS — T402X5A Adverse effect of other opioids, initial encounter: Secondary | ICD-10-CM | POA: Diagnosis not present

## 2020-04-11 DIAGNOSIS — M2550 Pain in unspecified joint: Secondary | ICD-10-CM | POA: Diagnosis not present

## 2020-04-11 DIAGNOSIS — R0602 Shortness of breath: Secondary | ICD-10-CM | POA: Diagnosis not present

## 2020-04-11 DIAGNOSIS — K56609 Unspecified intestinal obstruction, unspecified as to partial versus complete obstruction: Secondary | ICD-10-CM | POA: Diagnosis not present

## 2020-04-11 DIAGNOSIS — Z20822 Contact with and (suspected) exposure to covid-19: Secondary | ICD-10-CM | POA: Diagnosis present

## 2020-04-11 DIAGNOSIS — Z9049 Acquired absence of other specified parts of digestive tract: Secondary | ICD-10-CM | POA: Diagnosis not present

## 2020-04-11 DIAGNOSIS — E876 Hypokalemia: Secondary | ICD-10-CM | POA: Diagnosis not present

## 2020-04-11 DIAGNOSIS — M069 Rheumatoid arthritis, unspecified: Secondary | ICD-10-CM | POA: Diagnosis present

## 2020-04-11 DIAGNOSIS — E46 Unspecified protein-calorie malnutrition: Secondary | ICD-10-CM | POA: Diagnosis present

## 2020-04-11 DIAGNOSIS — I10 Essential (primary) hypertension: Secondary | ICD-10-CM | POA: Diagnosis not present

## 2020-04-11 DIAGNOSIS — R112 Nausea with vomiting, unspecified: Secondary | ICD-10-CM

## 2020-04-11 DIAGNOSIS — Z4682 Encounter for fitting and adjustment of non-vascular catheter: Secondary | ICD-10-CM | POA: Diagnosis not present

## 2020-04-11 DIAGNOSIS — K631 Perforation of intestine (nontraumatic): Secondary | ICD-10-CM | POA: Diagnosis present

## 2020-04-11 DIAGNOSIS — E875 Hyperkalemia: Secondary | ICD-10-CM | POA: Diagnosis not present

## 2020-04-11 DIAGNOSIS — K8689 Other specified diseases of pancreas: Secondary | ICD-10-CM | POA: Diagnosis present

## 2020-04-11 DIAGNOSIS — D72829 Elevated white blood cell count, unspecified: Secondary | ICD-10-CM | POA: Diagnosis present

## 2020-04-11 DIAGNOSIS — R1084 Generalized abdominal pain: Secondary | ICD-10-CM

## 2020-04-11 DIAGNOSIS — Z681 Body mass index (BMI) 19 or less, adult: Secondary | ICD-10-CM

## 2020-04-11 DIAGNOSIS — Z9221 Personal history of antineoplastic chemotherapy: Secondary | ICD-10-CM

## 2020-04-11 DIAGNOSIS — Q7959 Other congenital malformations of abdominal wall: Secondary | ICD-10-CM | POA: Diagnosis not present

## 2020-04-11 DIAGNOSIS — C187 Malignant neoplasm of sigmoid colon: Secondary | ICD-10-CM | POA: Diagnosis not present

## 2020-04-11 DIAGNOSIS — E873 Alkalosis: Secondary | ICD-10-CM | POA: Diagnosis not present

## 2020-04-11 DIAGNOSIS — C787 Secondary malignant neoplasm of liver and intrahepatic bile duct: Secondary | ICD-10-CM | POA: Diagnosis present

## 2020-04-11 DIAGNOSIS — R109 Unspecified abdominal pain: Secondary | ICD-10-CM | POA: Diagnosis present

## 2020-04-11 DIAGNOSIS — K5900 Constipation, unspecified: Secondary | ICD-10-CM | POA: Diagnosis not present

## 2020-04-11 DIAGNOSIS — Z7401 Bed confinement status: Secondary | ICD-10-CM | POA: Diagnosis not present

## 2020-04-11 DIAGNOSIS — C7951 Secondary malignant neoplasm of bone: Secondary | ICD-10-CM | POA: Diagnosis present

## 2020-04-11 DIAGNOSIS — K6389 Other specified diseases of intestine: Secondary | ICD-10-CM | POA: Diagnosis not present

## 2020-04-11 DIAGNOSIS — F17210 Nicotine dependence, cigarettes, uncomplicated: Secondary | ICD-10-CM | POA: Diagnosis present

## 2020-04-11 DIAGNOSIS — R5381 Other malaise: Secondary | ICD-10-CM | POA: Diagnosis present

## 2020-04-11 DIAGNOSIS — J9811 Atelectasis: Secondary | ICD-10-CM | POA: Diagnosis not present

## 2020-04-11 DIAGNOSIS — I82221 Chronic embolism and thrombosis of inferior vena cava: Secondary | ICD-10-CM | POA: Diagnosis present

## 2020-04-11 DIAGNOSIS — N179 Acute kidney failure, unspecified: Secondary | ICD-10-CM | POA: Diagnosis present

## 2020-04-11 DIAGNOSIS — E871 Hypo-osmolality and hyponatremia: Secondary | ICD-10-CM | POA: Diagnosis present

## 2020-04-11 DIAGNOSIS — K56600 Partial intestinal obstruction, unspecified as to cause: Secondary | ICD-10-CM | POA: Diagnosis not present

## 2020-04-11 DIAGNOSIS — C189 Malignant neoplasm of colon, unspecified: Secondary | ICD-10-CM

## 2020-04-11 DIAGNOSIS — K2900 Acute gastritis without bleeding: Secondary | ICD-10-CM | POA: Diagnosis not present

## 2020-04-11 DIAGNOSIS — Z03818 Encounter for observation for suspected exposure to other biological agents ruled out: Secondary | ICD-10-CM | POA: Diagnosis not present

## 2020-04-11 DIAGNOSIS — K5652 Intestinal adhesions [bands] with complete obstruction: Secondary | ICD-10-CM | POA: Diagnosis not present

## 2020-04-11 DIAGNOSIS — K565 Intestinal adhesions [bands], unspecified as to partial versus complete obstruction: Secondary | ICD-10-CM | POA: Diagnosis not present

## 2020-04-11 DIAGNOSIS — Z8249 Family history of ischemic heart disease and other diseases of the circulatory system: Secondary | ICD-10-CM

## 2020-04-11 DIAGNOSIS — C7989 Secondary malignant neoplasm of other specified sites: Secondary | ICD-10-CM | POA: Diagnosis present

## 2020-04-11 DIAGNOSIS — Z7901 Long term (current) use of anticoagulants: Secondary | ICD-10-CM

## 2020-04-11 DIAGNOSIS — R52 Pain, unspecified: Secondary | ICD-10-CM | POA: Diagnosis not present

## 2020-04-11 DIAGNOSIS — Z823 Family history of stroke: Secondary | ICD-10-CM

## 2020-04-11 DIAGNOSIS — Z85038 Personal history of other malignant neoplasm of large intestine: Secondary | ICD-10-CM

## 2020-04-11 DIAGNOSIS — K5669 Other partial intestinal obstruction: Secondary | ICD-10-CM | POA: Diagnosis not present

## 2020-04-11 DIAGNOSIS — C182 Malignant neoplasm of ascending colon: Secondary | ICD-10-CM | POA: Diagnosis not present

## 2020-04-11 DIAGNOSIS — Z0189 Encounter for other specified special examinations: Secondary | ICD-10-CM

## 2020-04-11 DIAGNOSIS — C774 Secondary and unspecified malignant neoplasm of inguinal and lower limb lymph nodes: Secondary | ICD-10-CM | POA: Diagnosis not present

## 2020-04-11 DIAGNOSIS — S31109A Unspecified open wound of abdominal wall, unspecified quadrant without penetration into peritoneal cavity, initial encounter: Secondary | ICD-10-CM | POA: Diagnosis not present

## 2020-04-11 LAB — URINALYSIS, COMPLETE (UACMP) WITH MICROSCOPIC
Bacteria, UA: NONE SEEN
Bilirubin Urine: NEGATIVE
Glucose, UA: NEGATIVE mg/dL
Hgb urine dipstick: NEGATIVE
Ketones, ur: NEGATIVE mg/dL
Leukocytes,Ua: NEGATIVE
Nitrite: NEGATIVE
Protein, ur: NEGATIVE mg/dL
Specific Gravity, Urine: 1.045 — ABNORMAL HIGH (ref 1.005–1.030)
pH: 5 (ref 5.0–8.0)

## 2020-04-11 LAB — COMPREHENSIVE METABOLIC PANEL
ALT: 13 U/L (ref 0–44)
AST: 18 U/L (ref 15–41)
Albumin: 4.9 g/dL (ref 3.5–5.0)
Alkaline Phosphatase: 93 U/L (ref 38–126)
Anion gap: 20 — ABNORMAL HIGH (ref 5–15)
BUN: 87 mg/dL — ABNORMAL HIGH (ref 8–23)
CO2: 28 mmol/L (ref 22–32)
Calcium: 8.8 mg/dL — ABNORMAL LOW (ref 8.9–10.3)
Chloride: 81 mmol/L — ABNORMAL LOW (ref 98–111)
Creatinine, Ser: 1.68 mg/dL — ABNORMAL HIGH (ref 0.44–1.00)
GFR calc Af Amer: 36 mL/min — ABNORMAL LOW (ref >60)
GFR calc non Af Amer: 31 mL/min — ABNORMAL LOW (ref >60)
Glucose, Bld: 154 mg/dL — ABNORMAL HIGH (ref 70–99)
Potassium: 3.6 mmol/L (ref 3.5–5.1)
Sodium: 129 mmol/L — ABNORMAL LOW (ref 135–145)
Total Bilirubin: 1 mg/dL (ref 0.3–1.2)
Total Protein: 7.7 g/dL (ref 6.5–8.1)

## 2020-04-11 LAB — CREATININE, URINE, RANDOM: Creatinine, Urine: 85.26 mg/dL

## 2020-04-11 LAB — CBC WITH DIFFERENTIAL/PLATELET
Abs Immature Granulocytes: 0.1 10*3/uL — ABNORMAL HIGH (ref 0.00–0.07)
Basophils Absolute: 0 10*3/uL (ref 0.0–0.1)
Basophils Relative: 0 %
Eosinophils Absolute: 0 10*3/uL (ref 0.0–0.5)
Eosinophils Relative: 0 %
HCT: 46.2 % — ABNORMAL HIGH (ref 36.0–46.0)
Hemoglobin: 16.3 g/dL — ABNORMAL HIGH (ref 12.0–15.0)
Immature Granulocytes: 1 %
Lymphocytes Relative: 6 %
Lymphs Abs: 1.3 10*3/uL (ref 0.7–4.0)
MCH: 34.2 pg — ABNORMAL HIGH (ref 26.0–34.0)
MCHC: 35.3 g/dL (ref 30.0–36.0)
MCV: 97.1 fL (ref 80.0–100.0)
Monocytes Absolute: 1.4 10*3/uL — ABNORMAL HIGH (ref 0.1–1.0)
Monocytes Relative: 7 %
Neutro Abs: 18 10*3/uL — ABNORMAL HIGH (ref 1.7–7.7)
Neutrophils Relative %: 86 %
Platelets: 211 10*3/uL (ref 150–400)
RBC: 4.76 MIL/uL (ref 3.87–5.11)
RDW: 13.3 % (ref 11.5–15.5)
WBC: 20.8 10*3/uL — ABNORMAL HIGH (ref 4.0–10.5)
nRBC: 0 % (ref 0.0–0.2)

## 2020-04-11 LAB — LIPASE, BLOOD: Lipase: 59 U/L — ABNORMAL HIGH (ref 11–51)

## 2020-04-11 LAB — MAGNESIUM: Magnesium: 2.1 mg/dL (ref 1.7–2.4)

## 2020-04-11 LAB — RESPIRATORY PANEL BY RT PCR (FLU A&B, COVID)
Influenza A by PCR: NEGATIVE
Influenza B by PCR: NEGATIVE
SARS Coronavirus 2 by RT PCR: NEGATIVE

## 2020-04-11 LAB — SODIUM, URINE, RANDOM: Sodium, Ur: 17 mmol/L

## 2020-04-11 MED ORDER — PROMETHAZINE HCL 25 MG/ML IJ SOLN
12.5000 mg | Freq: Four times a day (QID) | INTRAMUSCULAR | Status: DC | PRN
  Administered 2020-04-12 – 2020-04-21 (×4): 12.5 mg via INTRAVENOUS
  Filled 2020-04-11 (×5): qty 1

## 2020-04-11 MED ORDER — IOHEXOL 300 MG/ML  SOLN
75.0000 mL | Freq: Once | INTRAMUSCULAR | Status: AC | PRN
  Administered 2020-04-11: 17:00:00 75 mL via INTRAVENOUS

## 2020-04-11 MED ORDER — SODIUM CHLORIDE 0.9% FLUSH
3.0000 mL | Freq: Two times a day (BID) | INTRAVENOUS | Status: DC
  Administered 2020-04-12 – 2020-05-02 (×20): 3 mL via INTRAVENOUS

## 2020-04-11 MED ORDER — NALOXONE HCL 0.4 MG/ML IJ SOLN: 0.4000 mg | INTRAMUSCULAR | Status: DC | PRN

## 2020-04-11 MED ORDER — MORPHINE SULFATE (PF) 4 MG/ML IV SOLN
4.0000 mg | INTRAVENOUS | Status: DC | PRN
  Administered 2020-04-11 – 2020-04-19 (×25): 4 mg via INTRAVENOUS
  Filled 2020-04-11 (×27): qty 1

## 2020-04-11 MED ORDER — ONDANSETRON HCL 4 MG/2ML IJ SOLN
4.0000 mg | Freq: Once | INTRAMUSCULAR | Status: AC
  Administered 2020-04-11: 4 mg via INTRAVENOUS
  Filled 2020-04-11: qty 2

## 2020-04-11 MED ORDER — SODIUM CHLORIDE 0.9 % IV BOLUS
1000.0000 mL | Freq: Once | INTRAVENOUS | Status: AC
  Administered 2020-04-11: 1000 mL via INTRAVENOUS

## 2020-04-11 MED ORDER — MORPHINE SULFATE (PF) 4 MG/ML IV SOLN
4.0000 mg | Freq: Once | INTRAVENOUS | Status: AC
  Administered 2020-04-11: 16:00:00 4 mg via INTRAVENOUS
  Filled 2020-04-11: qty 1

## 2020-04-11 MED ORDER — ONDANSETRON HCL 4 MG/2ML IJ SOLN
4.0000 mg | Freq: Four times a day (QID) | INTRAMUSCULAR | Status: DC | PRN
  Administered 2020-04-11 – 2020-04-17 (×7): 4 mg via INTRAVENOUS
  Filled 2020-04-11 (×7): qty 2

## 2020-04-11 MED ORDER — SODIUM CHLORIDE (PF) 0.9 % IJ SOLN
INTRAMUSCULAR | Status: AC
  Filled 2020-04-11: qty 50

## 2020-04-11 MED ORDER — NICOTINE 14 MG/24HR TD PT24: 14.0000 mg | MEDICATED_PATCH | Freq: Every day | TRANSDERMAL | Status: DC | PRN

## 2020-04-11 MED ORDER — MORPHINE SULFATE (PF) 4 MG/ML IV SOLN
4.0000 mg | Freq: Once | INTRAVENOUS | Status: AC
  Administered 2020-04-11: 20:00:00 4 mg via INTRAVENOUS
  Filled 2020-04-11: qty 1

## 2020-04-11 MED ORDER — LACTATED RINGERS IV SOLN: INTRAVENOUS | Status: AC

## 2020-04-11 NOTE — Telephone Encounter (Signed)
Patient left VM that was retrieved today at 1532 requesting call asap. Noted she was in emergency room. MD notified.

## 2020-04-11 NOTE — ED Notes (Signed)
Pt transported to CT ?

## 2020-04-11 NOTE — ED Triage Notes (Signed)
Pt c/o starting Thursday night constipation stomach cramps and nausea. Administered enema on Saturday with success, but continuing to have same symptoms.

## 2020-04-11 NOTE — ED Triage Notes (Signed)
BIB EMS Thursday abd pain, abd distended, N/V/Constipation since Thursday, has Colon Ca. Last treatment 3 weeks ago. Nauseated 4mg  Zofran given #20 L AC. 110/70-88-CBG 171

## 2020-04-11 NOTE — H&P (Addendum)
History and Physical    PLEASE NOTE THAT DRAGON DICTATION SOFTWARE WAS USED IN THE CONSTRUCTION OF THIS NOTE.   Yolanda Watson [id number] DOB: [birth year] DOA: 04/11/2020  PCP: Patient, No Pcp Per Patient coming from: home   I have personally briefly reviewed patient's old medical records in [place]  Chief Complaint: Abdominal pain  HPI: Yolanda Watson is a 69 y.o. 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   The patient reports 4 days of general abdominal discomfort which she describes as crampy in nature.  Pain has been constant over that timeframe and worsens with palpation over the abdomen.  She reports associated constipation over that time, with most recent bowel movement occurring 4 days ago.  She notes diminished flatus production since the time of her most recent bowel movement as well.  Associated with nausea resulting in a total of 5 episodes of nonbloody emesis for the last 4 days, with most recent episode of vomiting occurring just prior to presentation to the emergency department today.  She notes a "green" appearance associated with some of her episodes of emesis.  Denies any preceding trauma or diarrhea.  Denies any recent melena or hematochezia leading up to most recent bowel movement.  Denies any associated subjective fever, chills, rigors, or generalized myalgias.  The patient denies any prior history of small bowel obstruction although she acknowledges a history of abdominal surgery, including abdominal wall reconstruction in October 2018 in the setting of metastasis of her colon cancer to the abdominal wall.  In terms of her oncologic history, she has a history of colon cancer with  metastasis to the abdominal wall, inguinal lymph nodes, and liver.  She follows with [facility] health oncology, specifically with Ned Card, NP.  The patient reports that her most recent chemotherapy occurred 5 weeks ago, and that she is scheduled for her next round of chemotherapy to begin on Thursday, 04/20/2020.  Has a right chest Port-A-Cath for this purpose.  While have not yet encountered documentation of the following, the patient reports that she was diagnosed with an "abdominal wall clot" approximately 1 year ago following which she has been on anticoagulation via Xarelto.  The patient does not believe that the Xarelto was intended to be lifelong in nature, although she is unsure as to the instructed duration of her Xarelto use, which she reports is managed by her oncologist.  In the setting of recent nausea/vomiting, the patient reports that she has been unable to take her Xarelto over the last 2 days, resulting in most recent dose occurring on 04/09/2020.  She denies any recent headache, neck stiffness, rhinitis, rhinorrhea, sore throat, sob, cough, or rash. No recent traveling or known COVID-19 exposures. Denies dysuria, gross hematuria, or change in urinary urgency/frequency.  Denies any recent chest pain, diaphoresis, or palpitations.    ED Course:  Vital signs in the ED were notable for the following: Temperature max 97.4; heart rate 78-87; blood pressure 117/74 - 146/85; respiratory rate 1516; oxygen saturation 96 to 97% on room air.  Labs were notable for the following: CMP notable for sodium 129 relative to most recent prior sodium value 139 on 03/16/2020, potassium 3.6, chloride 81, bicarbonate 28, BUN 87, creatinine 1.68 relative to most recent prior serum creatinine  value of 0.80 on 03/16/2020, albumin 4.9, alkaline phosphatase 93, AST 18, ALT 13, and total bilirubin 1.0.  Lipase 59.  CBC notable for white blood cell count of 20,800 with 86% neutrophils, hemoglobin 16.3.  Screening  nasopharyngeal COVID-19 PCR swab was obtained in the ED, and found to be negative.   CT abdomen/pelvis showed evidence of small obstruction with transition point in the mid abdomen in the absence of evidence of bowel perforation or abscess.  The emergency department physician currently has a page out to the general surgeon, with associated recommendations and input pending at this time.   NG tube was placed in the ED, with the patient reporting ensuing significant improvement in her abdominal discomfort as well as nausea.  While in the ED, the following were administered: Morphine 4 mg IV x2, Zofran 4 mg IV x2, and a 1 L normal saline bolus.    Review of Systems: As per HPI otherwise 10 point review of systems negative.   Past Medical History:  Diagnosis Date  . Acquired bilateral hand deformity    both hands with severe digit contracture. VERY limited ROM   . Alcohol abuse   . Colon cancer ([place]) 2016   metastatic to abdominal wall  . Port-A-Cath in place since 04-2017   right chest     Past Surgical History:  Procedure Laterality Date  . ABDOMINAL SURGERY  2016  . ABDOMINOPLASTY N/A 10/03/2017   Procedure: ABDOMINAL WALL RECONSTRUCTION WITH STATTICE;  Surgeon: [name], MD;  Location: [facility];  Service: Plastics;  Laterality: N/A;  . DEBRIDEMENT AND CLOSURE WOUND N/A 01/01/2018   Procedure: DEBRIDEMENT AND CLOSURE ABDOMINAL WOUND;  Surgeon: [name], MD;  Location: [facility];  Service: Plastics;  Laterality: N/A;  . EXCISION OF ABDOMINAL WALL TUMOR N/A 10/03/2017   Procedure: EXCISION OF COLON CANCER TUMOR FROM ABDOMINAL;  Surgeon: Stark Klein, MD;  Location: [facility];  Service: General;  Laterality: N/A;  TAP BLOCK  . INCISION AND DRAINAGE OF WOUND N/A 11/14/2017   Procedure: IRRIGATION AND DEBRIDEMENT OF ABDOMINAL WALL;  Surgeon: [name], MD;  Location: [facility];  Service: Plastics;  Laterality: N/A;  . IR FLUORO GUIDE PORT INSERTION RIGHT  05/09/2017  . IR US  GUIDE VASC ACCESS RIGHT  05/09/2017   port-a-cath      Social History:  reports that she has been smoking cigarettes. She has a 45.00 pack-year smoking history. She has never used smokeless tobacco. She reports current alcohol use of about 2.0 - 4.0 standard drinks of alcohol per week. She reports that she does not use drugs.   Allergies  Allergen Reactions  . Penicillins Other (See Comments)    Tolerated cefepime 01/01/2018 "PASSED OUT AS A CHILD" > ? SYNCOPE ? PATIENT HAS HAD A PCN REACTION WITH IMMEDIATE RASH, FACIAL/TONGUE/THROAT SWELLING, SOB, OR LIGHTHEADEDNESS WITH HYPOTENSION:  #  #  #  YES  #  #  #   Has patient had a PCN reaction causing severe rash involving mucus membranes or skin necrosis: No Has patient had a PCN reaction that required hospitalization: No Has patient had a PCN reaction occurring within the last 10 years: No   . Latex Itching and Rash    BED "CHUX PADS"    Family History  Problem Relation Age of Onset  . Heart disease Mother   . Stroke Maternal Aunt   . Cancer Neg Hx   . Diabetes Neg Hx     Prior to Admission medications  Medication Sig Start Date End Date Taking? Authorizing Provider  naproxen sodium (ALEVE) 220 MG tablet Take 220 mg by mouth as needed.     [provider]  prochlorperazine (COMPAZINE) 10 MG tablet Take 1 tablet (10 mg total) by mouth every 6 (six) hours as needed for nausea. 06/23/19   Ladell Pier, MD  XARELTO 20 MG TABS tablet TAKE 1 TABLET (20 MG TOTAL) BY MOUTH DAILY WITH SUPPER. 02/21/20   Ladell Pier, MD     Objective    Physical Exam: Vitals:   04/11/20 1730 04/11/20 1745 04/11/20 1800 04/11/20 1830  BP: (!) 142/85  137/82 (!) 146/83  Pulse: 89 85 83 87  Resp:   16 16  Temp:      TempSrc:      SpO2: 97% 96% 97% 96%    General: appears to be stated age; alert, oriented Skin: warm, dry, no rash Head:  AT/[place]; NGT noted. Eyes:  PEARL b/l, EOMI Mouth:  Oral mucosa membranes appear dry, normal  dentition Neck: supple; trachea midline Heart:  RRR; did not appreciate any M/R/G Lungs: CTAB, did not appreciate any wheezes, rales, or rhonchi Abdomen: Hypoactive bowel sounds; soft, mildly distended; mild tenderness patient over all 4 abdominal quadrants in the absence of any associated guarding, rigidity, or rebound tenderness. Vascular: 2+ pedal pulses b/l; 2+ radial pulses b/l Extremities: no peripheral edema, no muscle wasting Neuro: strength and sensation intact in upper and lower extremities b/l   Labs on Admission: I have personally reviewed following labs and imaging studies  CBC: Recent Labs  Lab 04/11/20 1542  WBC 20.8*  NEUTROABS 18.0*  HGB 16.3*  HCT 46.2*  MCV 97.1  PLT 123456   Basic Metabolic Panel: Recent Labs  Lab 04/11/20 1542  NA 129*  K 3.6  CL 81*  CO2 28  GLUCOSE 154*  BUN 87*  CREATININE 1.68*  CALCIUM 8.8*   GFR: CrCl cannot be calculated (Unknown ideal weight.). Liver Function Tests: Recent Labs  Lab 04/11/20 1542  AST 18  ALT 13  ALKPHOS 93  BILITOT 1.0  PROT 7.7  ALBUMIN 4.9   Recent Labs  Lab 04/11/20 1542  LIPASE 59*   No results for input(s): AMMONIA in the last 168 hours. Coagulation Profile: No results for input(s): INR, PROTIME in the last 168 hours. Cardiac Enzymes: No results for input(s): CKTOTAL, CKMB, CKMBINDEX, TROPONINI in the last 168 hours. BNP (last 3 results) No results for input(s): PROBNP in the last 8760 hours. HbA1C: No results for input(s): HGBA1C in the last 72 hours. CBG: No results for input(s): GLUCAP in the last 168 hours. Lipid Profile: No results for input(s): CHOL, HDL, LDLCALC, TRIG, CHOLHDL, LDLDIRECT in the last 72 hours. Thyroid Function Tests: No results for input(s): TSH, T4TOTAL, FREET4, T3FREE, THYROIDAB in the last 72 hours. Anemia Panel: No results for input(s): VITAMINB12, FOLATE, FERRITIN, TIBC, IRON, RETICCTPCT in the last 72 hours. Urine analysis:    Component Value  Date/Time   COLORURINE YELLOW 12/30/2017 [place] 12/30/2017 0817   LABSPEC 1.012 12/30/2017 0817   PHURINE 7.0 12/30/2017 [place] 12/30/2017 0817   HGBUR NEGATIVE 12/30/2017 0817   BILIRUBINUR NEGATIVE 12/30/2017 0817   KETONESUR 5 (A) 12/30/2017 0817   PROTEINUR NEGATIVE 12/30/2017 0817   NITRITE NEGATIVE 12/30/2017 0817   LEUKOCYTESUR NEGATIVE 12/30/2017 0817    Radiological Exams on Admission: CT ABDOMEN PELVIS W CONTRAST  Result Date: 04/11/2020 CLINICAL DATA:  Bowel obstruction.  EXAM: CT ABDOMEN AND PELVIS WITH CONTRAST TECHNIQUE: Multidetector CT imaging of the abdomen and pelvis was performed using the standard protocol following bolus administration of intravenous contrast. CONTRAST:  29mL OMNIPAQUE IOHEXOL 300 MG/ML  SOLN COMPARISON:  CT abdomen pelvis 09/08/2019 FINDINGS: Lower chest: Scarring at the lung bases is stable. Previously noted subpleural nodule is not visualized on today's exam. Heart size is normal. No significant pleural or pericardial effusion is present. Hepatobiliary: An ill-defined mass lesion in the posterior right lobe measures 3.5 x 3.5 x 2.9 cm, increased in size since the prior exam. The second lesion along the inferior [place] lobe is no longer present. Granulomatous changes are noted. Pancreas: Pancreatic duct dilation is new. No mass lesion is present. Spleen: Granulomatous changes are present. Adrenals/Urinary Tract: Adrenal glands are normal bilaterally. Kidneys and ureters are unremarkable. No stone or mass lesion is present. Stomach/Bowel: Stomach is distended. Small bowel obstruction is present. Transition point is in the mid abdomen, best seen on sagittal image 62 of series 5. Transition is at the level of image 49 of series 2. Distal small bowel and colon are collapsed. No free air is present. Vascular/Lymphatic: Atherosclerotic calcifications are present in the aorta and branch vessels without aneurysm. Previously noted  portacaval nodes have decreased in size. Retroperitoneal nodes have decreased in size. Left inguinal node measures up to 13 mm in short axis, slightly decreased in size. Paraspinal mass at L3 has diminished. Reproductive: Uterus and bilateral adnexa are unremarkable. Other: Adhesions are present. No free fluid is present. Ventral laxity is noted. Musculoskeletal: Lytic and sclerotic lesions are again noted at L3, greatest on the left. No new osseous metastases are present. Advanced facet disease is present. The hips are located and within normal limits. IMPRESSION: 1. Small bowel obstruction with transition point in the mid abdomen. 2. Metastatic lesion in the posterior right lobe of the liver is slightly larger than on the prior study. 3. The second lesion along the inferior right hepatic lobe is no longer present. 4. Lytic and sclerotic lesions at L3, greatest on the left. No new osseous metastases are present. Adjacent paraspinal mass at L3 has diminished. 5. Decreased size of retroperitoneal and portacaval lymph nodes. 6. New pancreatic duct dilation without a discrete mass lesion. 7. Aortic Atherosclerosis (ICD10-I70.0). Electronically Signed   By: San Morelle M.D.   On: 04/11/2020 18:10     Assessment/Plan   CARINE GUILLET is a 69 y.o. 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    Principal Problem:   SBO (small bowel obstruction) (HCC) Active Problems:   Abdominal pain   Nausea & vomiting   Acute hyponatremia   AKI (acute kidney injury) ([place])   Leukocytosis   #) Small bowel obstruction: Diagnosis on the basis of 4 to 5 days of progressive abdominal discomfort associated with nausea/vomiting as well as  diminished flatus production, with most recent bowel movement occurring 4 days ago and CT abdomen/pelvis showing evidence of small bowel obstruction with transition point in the mid abdomen in the absence of any associated evidence of bowel perforation or abscess.  In the context of a history of multiple prior abdominal surgeries, suspect that presenting small bowel obstruction is mechanical in nature secondary to postoperative adhesions.  Will initiate conservative measures,  and observe for return of normal bowel function, as described below.  Of note, NG tube placed in the ED, with ensuing symptomatic improvement.  Plan: NPO. LR @ 75 cc per hour. Prn IV morphine. Prn IV Zofran. NG tube attached to low, intermittent wall suction. SCD's for DVT prophylaxis in case the patient should ultimately require surgical intervention.  Recheck CMP and CBC in the morning.      #) Acute kidney injury: Presenting serum creatinine noted to be 1.68 relative to most recent prior value of 0.80 when checked on 03/16/2020.  Suspect that this is prerenal in nature in the setting of intravascular depletion due to dehydration from recent increase in GI losses in the form of nausea/vomiting complicated by diminished oral intake over that time.   Plan: Check urinalysis with microscopy, including attention for the presence of urinary gas.  Check random urine sodium as well as random urine creatinine.  Monitor strict I's and O's and daily weights.  Attempt to avoid nephrotoxic agents.  IV fluids, as further described above.  Repeat BMP in the morning.     #) Leukocytosis: Presenting CBC reflects a blood cell count of 20,800.  Suspect potential multifactorial inflammatory contributions in the setting of presenting small bowel obstruction as well as inflammatory contribution in the context of underlying malignancy versus concentrating effects of presenting intravascular depletion due to dehydration, as further described above.  At  this time, no overt evidence of underlying infectious process.  Of note, nasopharyngeal COVID-19 PCR performed in the ED CXR to be negative.  Plan: Check urinalysis.  Repeat CBC differential in the morning.  Work-up and management of presenting small bowel obstruction, as above.      #) Acute hypovolemic hypoosmolar hyponatremia: Present labs reflect serum sodium 129, representing a decline from most recent prior value of 139 on 03/16/2020.  Suspect that this is due to dehydration from recent nausea/vomiting in the context of presenting small bowel obstruction.   Plan: Check random urine sodium as well as urine osmolality.  Check urinalysis.  Check TSH.  IV fluids, as further described above.  Monitor strict I's and O's and daily weights.  Repeat BMP in the morning.      #) History of abdominal wall thrombus: the patient reports that she was diagnosed with an "abdominal wall clot" approximately 1 year ago following which she has been on anticoagulation via Xarelto.  The patient does not believe that the Xarelto was intended to be lifelong in nature, although she is unsure as to the instructed duration of her Xarelto use, which she reports is managed by her oncologist.    Plan: will attempt additional chart review and further insight into this history as well as documentation of the associated plan including duration of anticoagulant use.  For now, in the setting of current n.p.o. status due to presenting SBO, will hold Xarelto.     #) Chronic tobacco abuse: The patient reports that she has smoked approximately half pack per day over the last 50 years.  She acknowledges that she briefly quit smoking in 2018, but has subsequently resumed her smoking habits.  Plan: Counseled the patient on the importance of complete smoking discontinuation.  Have also ordered a as needed nicotine patch for use during his hospitalization.    DVT prophylaxis: SCDs Code Status: The patient conveys her wish to  be DNR/DNI. Family Communication: none Disposition Plan: Per Rounding Team Consults called: general surgery paged, with ensuing recommendations/input currently pending Admission status: Inpatient; MedSurg.  PLEASE NOTE THAT DRAGON DICTATION SOFTWARE WAS USED IN THE CONSTRUCTION OF THIS NOTE.   [facility] Pager [phone] From [place].   Otherwise, please contact night-coverage  [email or web address] Password [facility]  04/11/2020, 7:02 PM

## 2020-04-11 NOTE — ED Notes (Signed)
Provided pt with ice chips 

## 2020-04-11 NOTE — ED Provider Notes (Signed)
[facility] DEPT Provider Note   CSN: [id number] Arrival date & time: 04/11/20  1426     History Chief Complaint  Patient presents with  . Abdominal Pain  . Constipation  . Nausea    Yolanda Watson is a 69 y.o. female.  Patient is a 69 year old female with past medical history of colon cancer with metastasis to the liver, abdominal wall surgery with reconstruction.  She presents today for evaluation of abdominal distention, nausea, and vomiting.  She is also not had a bowel movement in the past 4 days.  Patient describes bilious emesis with no blood.  She denies fevers or chills.  She denies ill contacts.  She did try fleets enema with minimal results, but no relief of her distention and cramping.  The history is provided by the patient.  Abdominal Pain Pain location:  Generalized Pain quality: cramping   Pain radiates to:  Does not radiate Pain severity:  Moderate Onset quality:  Gradual Duration:  4 days Timing:  Constant Progression:  Worsening Chronicity:  New Relieved by:  Nothing Worsened by:  Nothing Ineffective treatments: Fleets enema. Associated symptoms: constipation   Constipation Associated symptoms: abdominal pain        Past Medical History:  Diagnosis Date  . Acquired bilateral hand deformity    both hands with severe digit contracture. VERY limited ROM   . Alcohol abuse   . Colon cancer ([place]) 2016   metastatic to abdominal wall  . Port-A-Cath in place since 04-2017   right chest     Patient Active Problem List   Diagnosis Date Noted  . Metastatic colon cancer to liver ([facility]) 06/17/2019  . Goals of care, counseling/discussion 06/17/2019  . At risk for adverse drug event 01/13/2018  . Anemia, unspecified 01/11/2018  . Abdominal pain, lower   . Encounter for palliative care   . Abdominal wall abscess at site of surgical wound   . Acute osteomyelitis of pelvic region ([facility])   . Open wound of abdomen 11/14/2017  .  Port-A-Cath in place 10/21/2017  . Local recurrence of colon cancer ([facility]) 10/03/2017  . Secondary malignancy of inguinal lymph nodes ([facility]) 07/29/2017  . Cancer of right colon ([facility]) 05/05/2017    Past Surgical History:  Procedure Laterality Date  . ABDOMINAL SURGERY  2016  . ABDOMINOPLASTY N/A 10/03/2017   Procedure: ABDOMINAL WALL RECONSTRUCTION WITH STATTICE;  Surgeon: [name], MD;  Location: [facility];  Service: Plastics;  Laterality: N/A;  . DEBRIDEMENT AND CLOSURE WOUND N/A 01/01/2018   Procedure: DEBRIDEMENT AND CLOSURE ABDOMINAL WOUND;  Surgeon: [name], MD;  Location: [facility];  Service: Plastics;  Laterality: N/A;  . EXCISION OF ABDOMINAL WALL TUMOR N/A 10/03/2017   Procedure: EXCISION OF COLON CANCER TUMOR FROM ABDOMINAL;  Surgeon: Stark Klein, MD;  Location: [facility];  Service: General;  Laterality: N/A;  TAP BLOCK  . INCISION AND DRAINAGE OF WOUND N/A 11/14/2017   Procedure: IRRIGATION AND DEBRIDEMENT OF ABDOMINAL WALL;  Surgeon: [name], MD;  Location: [facility];  Service: Plastics;  Laterality: N/A;  . IR FLUORO GUIDE PORT INSERTION RIGHT  05/09/2017  . IR US GUIDE VASC ACCESS RIGHT  05/09/2017   port-a-cath       OB History   No obstetric history on file.     Family History  Problem Relation Age of Onset  . Heart disease Mother   . Stroke Maternal Aunt   . Cancer Neg Hx   . Diabetes Neg Hx  Social History   Tobacco Use  . Smoking status: Current Every Day Smoker    Packs/day: 1.00    Years: 45.00    Pack years: 45.00    Types: Cigarettes  . Smokeless tobacco: Never Used  . Tobacco comment: She has not smoked since 12/29/17  Substance Use Topics  . Alcohol use: Yes    Alcohol/week: 2.0 - 4.0 standard drinks    Types: 2 - 4 Shots of liquor per week    Comment: denies problem with ETOH but contineus to drink "a couple of drinks per day"  . Drug use: No    Home Medications Prior to Admission medications   Medication Sig Start Date End  Date Taking? Authorizing Provider  naproxen sodium (ALEVE) 220 MG tablet Take 220 mg by mouth as needed.     [provider]  prochlorperazine (COMPAZINE) 10 MG tablet Take 1 tablet (10 mg total) by mouth every 6 (six) hours as needed for nausea. 06/23/19   Ladell Pier, MD  XARELTO 20 MG TABS tablet TAKE 1 TABLET (20 MG TOTAL) BY MOUTH DAILY WITH SUPPER. 02/21/20   Ladell Pier, MD    Allergies    Penicillins and Latex  Review of Systems   Review of Systems  Gastrointestinal: Positive for abdominal pain and constipation.  All other systems reviewed and are negative.   Physical Exam Updated Vital Signs BP 117/74 (BP Location: Right Arm)   Pulse 88   Temp (!) 97.4 F (36.3 C) (Oral)   Resp 18   SpO2 96%   Physical Exam Vitals and nursing note reviewed.  Constitutional:      General: She is not in acute distress.    Appearance: She is well-developed. She is not diaphoretic.  HENT:     Head: Normocephalic and atraumatic.  Cardiovascular:     Rate and Rhythm: Normal rate and regular rhythm.     Heart sounds: No murmur. No friction rub. No gallop.   Pulmonary:     Effort: Pulmonary effort is normal. No respiratory distress.     Breath sounds: Normal breath sounds. No wheezing.  Abdominal:     General: Bowel sounds are normal. There is distension.     Palpations: Abdomen is soft.     Tenderness: There is generalized abdominal tenderness. There is no guarding or rebound.     Comments: Abdomen is distended and tympanic.  There is generalized tenderness with no rebound or guarding.  Musculoskeletal:        General: Normal range of motion.     Cervical back: Normal range of motion and neck supple.  Skin:    General: Skin is warm and dry.  Neurological:     Mental Status: She is alert and oriented to person, place, and time.     ED Results / Procedures / Treatments   Labs (all labs ordered are listed, but only abnormal results are displayed) Labs Reviewed   COMPREHENSIVE METABOLIC PANEL  LIPASE, BLOOD  CBC WITH DIFFERENTIAL/PLATELET    EKG None  Radiology No results found.  Procedures Procedures (including critical care time)  Medications Ordered in ED Medications  sodium chloride 0.9 % bolus 1,000 mL (has no administration in time range)  ondansetron (ZOFRAN) injection 4 mg (has no administration in time range)  morphine 4 MG/ML injection 4 mg (has no administration in time range)    ED Course  I have reviewed the triage vital signs and the nursing notes.  Pertinent labs &  imaging results that were available during my care of the patient were reviewed by me and considered in my medical decision making (see chart for details).    MDM Rules/Calculators/A&P  Patient with history of metastatic colon cancer presenting with complaints of no bowel movement for several days, abdominal, cramping, distension, and bilious vomiting for the past few days.    Patient's exam reveals a tympanic, distended abdomen.  Her CT scan shows a small bowel obstruction with transition point in the mid abdomen.  She also has an elevated white count of 21,000 along with acute renal failure.  Patient given medicine for pain and nausea as well as IV hydration.  She will be admitted to the hospitalist service for further care.  Patient will receive an NG tube.  Final Clinical Impression(s) / ED Diagnoses Final diagnoses:  None    Rx / DC Orders ED Discharge Orders    None       Veryl Speak, MD 04/11/20 [phone]

## 2020-04-12 ENCOUNTER — Inpatient Hospital Stay (HOSPITAL_COMMUNITY): Payer: Medicare Other

## 2020-04-12 DIAGNOSIS — N179 Acute kidney failure, unspecified: Secondary | ICD-10-CM | POA: Diagnosis not present

## 2020-04-12 DIAGNOSIS — R112 Nausea with vomiting, unspecified: Secondary | ICD-10-CM | POA: Diagnosis present

## 2020-04-12 DIAGNOSIS — K56609 Unspecified intestinal obstruction, unspecified as to partial versus complete obstruction: Secondary | ICD-10-CM | POA: Diagnosis not present

## 2020-04-12 DIAGNOSIS — R109 Unspecified abdominal pain: Secondary | ICD-10-CM | POA: Diagnosis present

## 2020-04-12 DIAGNOSIS — E871 Hypo-osmolality and hyponatremia: Secondary | ICD-10-CM | POA: Diagnosis present

## 2020-04-12 DIAGNOSIS — D72829 Elevated white blood cell count, unspecified: Secondary | ICD-10-CM | POA: Diagnosis present

## 2020-04-12 LAB — CBC WITH DIFFERENTIAL/PLATELET
Abs Immature Granulocytes: 0.14 K/uL — ABNORMAL HIGH (ref 0.00–0.07)
Basophils Absolute: 0.1 K/uL (ref 0.0–0.1)
Basophils Relative: 0 %
Eosinophils Absolute: 0.1 K/uL (ref 0.0–0.5)
Eosinophils Relative: 1 %
HCT: 43 % (ref 36.0–46.0)
Hemoglobin: 14.7 g/dL (ref 12.0–15.0)
Immature Granulocytes: 1 %
Lymphocytes Relative: 9 %
Lymphs Abs: 1.8 K/uL (ref 0.7–4.0)
MCH: 34.2 pg — ABNORMAL HIGH (ref 26.0–34.0)
MCHC: 34.2 g/dL (ref 30.0–36.0)
MCV: 100 fL (ref 80.0–100.0)
Monocytes Absolute: 1.7 K/uL — ABNORMAL HIGH (ref 0.1–1.0)
Monocytes Relative: 9 %
Neutro Abs: 15.5 K/uL — ABNORMAL HIGH (ref 1.7–7.7)
Neutrophils Relative %: 80 %
Platelets: 250 K/uL (ref 150–400)
RBC: 4.3 MIL/uL (ref 3.87–5.11)
RDW: 13.4 % (ref 11.5–15.5)
WBC: 19.3 K/uL — ABNORMAL HIGH (ref 4.0–10.5)
nRBC: 0 % (ref 0.0–0.2)

## 2020-04-12 LAB — COMPREHENSIVE METABOLIC PANEL WITH GFR
ALT: 12 U/L (ref 0–44)
AST: 16 U/L (ref 15–41)
Albumin: 4.6 g/dL (ref 3.5–5.0)
Alkaline Phosphatase: 93 U/L (ref 38–126)
Anion gap: 16 — ABNORMAL HIGH (ref 5–15)
BUN: 87 mg/dL — ABNORMAL HIGH (ref 8–23)
CO2: 32 mmol/L (ref 22–32)
Calcium: 8.5 mg/dL — ABNORMAL LOW (ref 8.9–10.3)
Chloride: 85 mmol/L — ABNORMAL LOW (ref 98–111)
Creatinine, Ser: 1.62 mg/dL — ABNORMAL HIGH (ref 0.44–1.00)
GFR calc Af Amer: 38 mL/min — ABNORMAL LOW
GFR calc non Af Amer: 33 mL/min — ABNORMAL LOW
Glucose, Bld: 124 mg/dL — ABNORMAL HIGH (ref 70–99)
Potassium: 3.1 mmol/L — ABNORMAL LOW (ref 3.5–5.1)
Sodium: 133 mmol/L — ABNORMAL LOW (ref 135–145)
Total Bilirubin: 1.1 mg/dL (ref 0.3–1.2)
Total Protein: 7.4 g/dL (ref 6.5–8.1)

## 2020-04-12 LAB — PROTIME-INR
INR: 0.9 (ref 0.8–1.2)
Prothrombin Time: 11.9 seconds (ref 11.4–15.2)

## 2020-04-12 LAB — TSH: TSH: 1.174 u[IU]/mL (ref 0.350–4.500)

## 2020-04-12 LAB — HIV ANTIBODY (ROUTINE TESTING W REFLEX): HIV Screen 4th Generation wRfx: NONREACTIVE

## 2020-04-12 LAB — MAGNESIUM: Magnesium: 2.2 mg/dL (ref 1.7–2.4)

## 2020-04-12 LAB — OSMOLALITY, URINE: Osmolality, Ur: 640 mosm/kg (ref 300–900)

## 2020-04-12 MED ORDER — PANTOPRAZOLE SODIUM 40 MG IV SOLR
40.0000 mg | Freq: Once | INTRAVENOUS | Status: AC
  Administered 2020-04-12: 40 mg via INTRAVENOUS
  Filled 2020-04-12: qty 40

## 2020-04-12 MED ORDER — SODIUM CHLORIDE 0.9% FLUSH
10.0000 mL | INTRAVENOUS | Status: DC | PRN
  Administered 2020-04-15 – 2020-04-28 (×3): 10 mL

## 2020-04-12 MED ORDER — ENOXAPARIN SODIUM 30 MG/0.3ML ~~LOC~~ SOLN
30.0000 mg | SUBCUTANEOUS | Status: DC
  Filled 2020-04-12: qty 0.3

## 2020-04-12 MED ORDER — PHENOL 1.4 % MT LIQD
1.0000 | OROMUCOSAL | Status: DC | PRN
  Administered 2020-04-12 – 2020-04-15 (×3): 1 via OROMUCOSAL
  Filled 2020-04-12: qty 177

## 2020-04-12 MED ORDER — POTASSIUM CHLORIDE 10 MEQ/100ML IV SOLN
10.0000 meq | INTRAVENOUS | Status: AC
  Administered 2020-04-12 (×4): 10 meq via INTRAVENOUS
  Filled 2020-04-12: qty 100

## 2020-04-12 MED ORDER — CHLORHEXIDINE GLUCONATE CLOTH 2 % EX PADS
6.0000 | MEDICATED_PAD | Freq: Every day | CUTANEOUS | Status: DC
  Administered 2020-04-12 – 2020-05-04 (×23): 6 via TOPICAL

## 2020-04-12 MED ORDER — DIATRIZOATE MEGLUMINE & SODIUM 66-10 % PO SOLN
90.0000 mL | Freq: Once | ORAL | Status: AC
  Administered 2020-04-12: 90 mL via NASOGASTRIC
  Filled 2020-04-12: qty 90

## 2020-04-12 MED ORDER — ENOXAPARIN SODIUM 40 MG/0.4ML ~~LOC~~ SOLN: 40.0000 mg | SUBCUTANEOUS | Status: DC

## 2020-04-12 NOTE — Progress Notes (Signed)
PROGRESS NOTE    Yolanda Watson  [id number] DOB: [birth year] DOA: 04/11/2020 PCP: Patient, No Pcp Per   Brief Narrative: Yolanda Watson is a 69 y.o. 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 Patient presented secondary to abdominal pain and found to have an SBO.   Assessment & Plan:   Principal Problem:   SBO (small bowel obstruction) (HCC) Active Problems:   Abdominal pain   Nausea & vomiting   Acute hyponatremia   AKI (acute kidney injury) ([facility])   Leukocytosis   Small bowel obstruction CT scan significant for SBO with transition point located in the midabdomen. History of multiple intraabdominal surgeries. NG tube placed and on intermittent suction -General surgery consult: non-operative management for now, NG tube decompression, SBO protocol  Hyponatremia Likely secondary to some mild hypovolemia. -IV fluids  Hypokalemia -Replete  AKI Baseline creatinine of 0.8. Creatinine of 1.68 on admission. BUN significantly elevated. -Continue LR fluids  Leukocytosis In setting of SBO. Mildly improved. No evidence of infection. -Trend  Metastatic colon cancer Patient is currently on chemotherapy. Next cycle planned for about one week.  Pancreatic duct dilation Incidental finding on CT abdomen/pelvis. No mass lesion noted. Patient is currently asymptomatic. Lipase minimally elevated. Will need outpatient monitoring.  Abdominal wall thrombus On Xarelto as an outpatient. Held on admission -VTE prophylaxis with Lovenox   DVT prophylaxis: Lovenox Code Status:   Code Status: DNR Family Communication: None at bedside Disposition Plan: Discharge pending continued general surgery recommendations   Consultants:   General surgery  Procedures:   NG tube (4/13>>  Antimicrobials:  None    Subjective: No abdominal pain. No bowel movement of  flatus  Objective: Vitals:   04/12/20 0150 04/12/20 0500 04/12/20 0536 04/12/20 0919  BP: 131/75  128/68 133/65  Pulse: 72  70 70  Resp: 16  16 15   Temp: 97.6 F (36.4 C)  97.7 F (36.5 C) (!) 96.4 F (35.8 C)  TempSrc: Oral  Oral   SpO2: 97%  100% 99%  Weight:  47.2 kg 49 kg   Height:        Intake/Output Summary (Last 24 hours) at 04/12/2020 1251 Last data filed at 04/12/2020 1200 Gross per 24 hour  Intake 1341.03 ml  Output 3030 ml  Net -1688.97 ml   Filed Weights   04/11/20 2153 04/12/20 0500 04/12/20 0536  Weight: 47.3 kg 47.2 kg 49 kg    Examination:  General exam: Appears calm and comfortable Respiratory system: Clear to auscultation. Respiratory effort normal. Cardiovascular system: S1 & S2 heard, RRR. No murmurs, rubs, gallops or clicks. Gastrointestinal system: Abdomen is nondistended, soft and nontender. No organomegaly or masses felt. Normal bowel sounds heard. Central nervous system: Alert and oriented. No focal neurological deficits. Extremities: No edema. No calf tenderness Skin: No cyanosis. No rashes Psychiatry: Judgement and insight appear normal. Mood & affect appropriate.     Data Reviewed: I have personally reviewed following labs and imaging studies  CBC: Recent Labs  Lab 04/11/20 1542 04/12/20 0221  WBC 20.8* 19.3*  NEUTROABS 18.0* 15.5*  HGB 16.3* 14.7  HCT 46.2* 43.0  MCV 97.1 100.0  PLT 211 [id number]   Basic Metabolic Panel: Recent Labs  Lab 04/11/20 1542 04/12/20 0221  NA 129* 133*  K 3.6 3.1*  CL 81* 85*  CO2 28 32  GLUCOSE 154* 124*  BUN 87* 87*  CREATININE 1.68* 1.62*  CALCIUM 8.8* 8.5*  MG  2.1 2.2   GFR: Estimated Creatinine Clearance: 26.1 mL/min (A) (by C-G formula based on SCr of 1.62 mg/dL (H)). Liver Function Tests: Recent Labs  Lab 04/11/20 1542 04/12/20 0221  AST 18 16  ALT 13 12  ALKPHOS 93 93  BILITOT 1.0 1.1  PROT 7.7 7.4  ALBUMIN 4.9 4.6   Recent Labs  Lab 04/11/20 1542  LIPASE 59*   No  results for input(s): AMMONIA in the last 168 hours. Coagulation Profile: Recent Labs  Lab 04/12/20 0221  INR 0.9   Cardiac Enzymes: No results for input(s): CKTOTAL, CKMB, CKMBINDEX, TROPONINI in the last 168 hours. BNP (last 3 results) No results for input(s): PROBNP in the last 8760 hours. HbA1C: No results for input(s): HGBA1C in the last 72 hours. CBG: No results for input(s): GLUCAP in the last 168 hours. Lipid Profile: No results for input(s): CHOL, HDL, LDLCALC, TRIG, CHOLHDL, LDLDIRECT in the last 72 hours. Thyroid Function Tests: Recent Labs    04/12/20 0221  TSH 1.174   Anemia Panel: No results for input(s): VITAMINB12, FOLATE, FERRITIN, TIBC, IRON, RETICCTPCT in the last 72 hours. Sepsis Labs: No results for input(s): PROCALCITON, LATICACIDVEN in the last 168 hours.  Recent Results (from the past 240 hour(s))  Respiratory Panel by RT PCR (Flu A&B, Covid) - Nasopharyngeal Swab     Status: None   Collection Time: 04/11/20  6:49 PM   Specimen: Nasopharyngeal Swab  Result Value Ref Range Status   SARS Coronavirus 2 by RT PCR NEGATIVE NEGATIVE Final    Comment: (NOTE) SARS-CoV-2 target nucleic acids are NOT DETECTED. The SARS-CoV-2 RNA is generally detectable in upper respiratoy specimens during the acute phase of infection. The lowest concentration of SARS-CoV-2 viral copies this assay can detect is 131 copies/mL. A negative result does not preclude SARS-Cov-2 infection and should not be used as the sole basis for treatment or other patient management decisions. A negative result may occur with  improper specimen collection/handling, submission of specimen other than nasopharyngeal swab, presence of viral mutation(s) within the areas targeted by this assay, and inadequate number of viral copies (<131 copies/mL). A negative result must be combined with clinical observations, patient history, and epidemiological information. The expected result is Negative. Fact  Sheet for Patients:  PinkCheek.be Fact Sheet for Healthcare Providers:  GravelBags.it This test is not yet ap proved or cleared by the [place] FDA and  has been authorized for detection and/or diagnosis of SARS-CoV-2 by FDA under an Emergency Use Authorization (EUA). This EUA will remain  in effect (meaning this test can be used) for the duration of the COVID-19 declaration under Section 564(b)(1) of the Act, 21 U.S.C. section 360bbb-3(b)(1), unless the authorization is terminated or revoked sooner.    Influenza A by PCR NEGATIVE NEGATIVE Final   Influenza B by PCR NEGATIVE NEGATIVE Final    Comment: (NOTE) The Xpert Xpress SARS-CoV-2/FLU/RSV assay is intended as an aid in  the diagnosis of influenza from Nasopharyngeal swab specimens and  should not be used as a sole basis for treatment. Nasal washings and  aspirates are unacceptable for Xpert Xpress SARS-CoV-2/FLU/RSV  testing. Fact Sheet for Patients: PinkCheek.be Fact Sheet for Healthcare Providers: GravelBags.it This test is not yet approved or cleared by the [place] FDA and  has been authorized for detection and/or diagnosis of SARS-CoV-2 by  FDA under an Emergency Use Authorization (EUA). This EUA will remain  in effect (meaning this test can be used) for the duration of the  Covid-19 declaration under Section 564(b)(1) of the Act, 21  U.S.C. section 360bbb-3(b)(1), unless the authorization is  terminated or revoked. Performed at [facility], [place] [address] [place], [place] [address]          Radiology Studies: CT ABDOMEN PELVIS W CONTRAST  Result Date: 04/11/2020 CLINICAL DATA:  Bowel obstruction. EXAM: CT ABDOMEN AND PELVIS WITH CONTRAST TECHNIQUE: Multidetector CT imaging of the abdomen and pelvis was performed using the standard protocol following bolus administration  of intravenous contrast. CONTRAST:  75mL OMNIPAQUE IOHEXOL 300 MG/ML  SOLN COMPARISON:  CT abdomen pelvis 09/08/2019 FINDINGS: Lower chest: Scarring at the lung bases is stable. Previously noted subpleural nodule is not visualized on today's exam. Heart size is normal. No significant pleural or pericardial effusion is present. Hepatobiliary: An ill-defined mass lesion in the posterior right lobe measures 3.5 x 3.5 x 2.9 cm, increased in size since the prior exam. The second lesion along the inferior [place] lobe is no longer present. Granulomatous changes are noted. Pancreas: Pancreatic duct dilation is new. No mass lesion is present. Spleen: Granulomatous changes are present. Adrenals/Urinary Tract: Adrenal glands are normal bilaterally. Kidneys and ureters are unremarkable. No stone or mass lesion is present. Stomach/Bowel: Stomach is distended. Small bowel obstruction is present. Transition point is in the mid abdomen, best seen on sagittal image 62 of series 5. Transition is at the level of image 49 of series 2. Distal small bowel and colon are collapsed. No free air is present. Vascular/Lymphatic: Atherosclerotic calcifications are present in the aorta and branch vessels without aneurysm. Previously noted portacaval nodes have decreased in size. Retroperitoneal nodes have decreased in size. Left inguinal node measures up to 13 mm in short axis, slightly decreased in size. Paraspinal mass at L3 has diminished. Reproductive: Uterus and bilateral adnexa are unremarkable. Other: Adhesions are present. No free fluid is present. Ventral laxity is noted. Musculoskeletal: Lytic and sclerotic lesions are again noted at L3, greatest on the left. No new osseous metastases are present. Advanced facet disease is present. The hips are located and within normal limits. IMPRESSION: 1. Small bowel obstruction with transition point in the mid abdomen. 2. Metastatic lesion in the posterior right lobe of the liver is slightly  larger than on the prior study. 3. The second lesion along the inferior right hepatic lobe is no longer present. 4. Lytic and sclerotic lesions at L3, greatest on the left. No new osseous metastases are present. Adjacent paraspinal mass at L3 has diminished. 5. Decreased size of retroperitoneal and portacaval lymph nodes. 6. New pancreatic duct dilation without a discrete mass lesion. 7. Aortic Atherosclerosis (ICD10-I70.0). Electronically Signed   By: San Morelle M.D.   On: 04/11/2020 18:10   DG Abd Portable 1 View  Result Date: 04/11/2020 CLINICAL DATA:  Tube placement EXAM: PORTABLE ABDOMEN - 1 VIEW COMPARISON:  CT 04/11/2020 FINDINGS: Lung bases are clear. Esophageal tube tip and side port overlie the proximal stomach. Excreted contrast within the kidneys. IMPRESSION: Esophageal tube tip overlies the gastric fundus Electronically Signed   By: Donavan Foil M.D.   On: 04/11/2020 20:45        Scheduled Meds: . Chlorhexidine Gluconate Cloth  6 each Topical Daily  . diatrizoate meglumine-sodium  90 mL Per NG tube Once  . sodium chloride flush  3 mL Intravenous Q12H   Continuous Infusions: . lactated ringers 75 mL/hr at 04/12/20 0600  . potassium chloride       LOS: 1 day  Cordelia Poche, MD [facility] 04/12/2020, 12:51 PM  If 7PM-7AM, please contact night-coverage [email or web address]

## 2020-04-12 NOTE — Consult Note (Signed)
[name]  DESTENI Watson 18-Aug-1951  [id number].    Requesting MD: [name] Chief Complaint: constipation, cramps and nausea Reason for Consult: SBO  HPI:  Patient is a 69 year old female diagnosed with colon cancer 2016 had a right hemi colectomyin [name], [name].  She was seen in 10/01/2017 by [name] with metastatic colon cancer to the abdominal wall.  She subsequently underwent excision of malignant mass from the abdominal including skin, muscle, fascia and peritoneum (15 x 12 cm).  She is also gone General debridement of skin, subcu fascia, strattice 11/14/2017, [name].  She is currently on chemotherapy, it looks like she had her last therapy on 03/30/2020. Pt notes acute onset of abdominal distension, pain, nausea and vomiting on 04/07/19.  She has not been able to eat or drink anything but water and ginger ale since 04/06/20.  She use and enema on 4/9 with some results, but no BM since.  Ongoing nausea and vomiting since 04/07/20.  She presented to the ED yesterday and found to have SBO.  She had her last Xarelto on 04/06/20.  Work-up in the ED shows she is afebrile, vital signs are stable.  Sodium 129, glucose 124, BUN 87, creatinine 1.68, lipase 59, WBC 20.8, hemoglobin 16.8, hematocrit 46.2, platelets 211,000.  Covid is negative, urinalysis 11-20 WBC/HPF. CT scan: Shows the stomach is distended, with small bowel obstruction with transition point in the mid abdomen.  Metastatic lesion in the posterior right lobe of the liver, slightly larger than on the last study.  A second lesion in the inferior right hepatic lobe is no longer present.  Lytic and sclerotic lesions and L3 no new osseous metastasis present.  Decreased size in retroperitoneal and portacaval lymph nodes.  New pancreatic duct dilatation without discrete mass lesion. Patient was admitted by medicine and NG tube was placed in the ED.  Patient has had 2330 NG drainage since admission.  She  remains afebrile, vital signs are stable.  Sodium 133, potassium 3.1, glucose 124, creatinine 1.62, WBC remains elevated at 19.3.  Are asked to see.    ROS: Review of Systems  Constitutional: Negative.  Fever:    HENT: Negative.   Eyes: Negative.   Respiratory: Negative.   Cardiovascular: Negative.   Gastrointestinal: Positive for abdominal pain, nausea and vomiting.  Genitourinary: Negative.   Musculoskeletal: Negative.   Skin: Negative.   Neurological: Negative.   Endo/Heme/Allergies: Bruises/bleeds easily.  Psychiatric/Behavioral: Negative.     Family History  Problem Relation Age of Onset  . Heart disease Mother   . Stroke Maternal Aunt   . Cancer Neg Hx   . Diabetes Neg Hx     Past Medical History:  Diagnosis Date  . Acquired bilateral hand deformity    both hands with severe digit contracture. VERY limited ROM   . Alcohol abuse   . Colon cancer ([place]) 2016   metastatic to abdominal wall  . Port-A-Cath in place since 04-2017   right chest     Past Surgical History:  Procedure Laterality Date  . ABDOMINAL SURGERY  2016  . ABDOMINOPLASTY N/A 10/03/2017   Procedure: ABDOMINAL WALL RECONSTRUCTION WITH STATTICE;  Surgeon: [name], MD;  Location: [facility];  Service: Plastics;  Laterality: N/A;  . DEBRIDEMENT AND CLOSURE WOUND N/A 01/01/2018   Procedure: DEBRIDEMENT AND CLOSURE ABDOMINAL WOUND;  Surgeon: [name], MD;  Location: [facility];  Service: Plastics;  Laterality: N/A;  . EXCISION OF ABDOMINAL WALL TUMOR N/A 10/03/2017  Procedure: EXCISION OF COLON CANCER TUMOR FROM ABDOMINAL;  Surgeon: Stark Klein, MD;  Location: [facility];  Service: General;  Laterality: N/A;  TAP BLOCK  . INCISION AND DRAINAGE OF WOUND N/A 11/14/2017   Procedure: IRRIGATION AND DEBRIDEMENT OF ABDOMINAL WALL;  Surgeon: [name], MD;  Location: [facility];  Service: Plastics;  Laterality: N/A;  . IR FLUORO GUIDE PORT INSERTION RIGHT  05/09/2017  . IR US GUIDE VASC ACCESS RIGHT   05/09/2017   port-a-cath      Social History:  reports that she has been smoking cigarettes. She has a 45.00 pack-year smoking history. She has never used smokeless tobacco. She reports current alcohol use of about 2.0 - 4.0 standard drinks of alcohol per week. She reports that she does not use drugs. Tobacco:  Ongoing use ETOH:Occasional Drugs:  None  Allergies:  Allergies  Allergen Reactions  . Penicillins Other (See Comments)    Tolerated cefepime 01/01/2018 "PASSED OUT AS A CHILD" > ? SYNCOPE ? PATIENT HAS HAD A PCN REACTION WITH IMMEDIATE RASH, FACIAL/TONGUE/THROAT SWELLING, SOB, OR LIGHTHEADEDNESS WITH HYPOTENSION:  #  #  #  YES  #  #  #   Has patient had a PCN reaction causing severe rash involving mucus membranes or skin necrosis: No Has patient had a PCN reaction that required hospitalization: No Has patient had a PCN reaction occurring within the last 10 years: No   . Latex Itching and Rash    BED "CHUX PADS"    Medications Prior to Admission  Medication Sig Dispense Refill  . naproxen sodium (ALEVE) 220 MG tablet Take 220 mg by mouth as needed (pain).     Alveda Reasons 20 MG TABS tablet TAKE 1 TABLET (20 MG TOTAL) BY MOUTH DAILY WITH SUPPER. (Patient taking differently: Take 20 mg by mouth daily with supper. ) 30 tablet 1    Blood pressure 128/68, pulse 70, temperature 97.7 F (36.5 C), temperature source Oral, resp. rate 16, height 5\' 6"  (1.676 m), weight 49 kg, SpO2 100 %. Physical Exam:  General: pleasant, cachectic WF who is laying in bed in NAD.  NG in place and draining HEENT: head is normocephalic, atraumatic.  Sclera are noninjected.  PERRL.  Ears and nose without any masses or lesions. NG in place. Mouth is pink and moist Heart: regular, rate, and rhythm.  Normal s1,s2. No obvious murmurs, gallops, or rubs noted.  Palpable radial and pedal pulses bilaterally Lungs: CTAB, no wheezes, rhonchi, or rales noted.  Respiratory effort nonlabored Abd: soft, distended but not  tender, BS high pitched  no masses, hernias, or organomegaly. Picture of abdominal wall below.   MS: She has bilateral upper extremity contractures of her hands;  all 4  no cyanosis, clubbing, or edema of the extremities with good pulse upper and lower extremities.  Skin: warm and dry with no masses, lesions, or rashes Neuro: Cranial nerves 2-12 grossly intact, sensation is normal throughout Psych: A&Ox3 with an appropriate affect.     Results for orders placed or performed during the hospital encounter of 04/11/20 (from the past 48 hour(s))  Comprehensive metabolic panel     Status: Abnormal   Collection Time: 04/11/20  3:42 PM  Result Value Ref Range   Sodium 129 (L) 135 - 145 mmol/L   Potassium 3.6 3.5 - 5.1 mmol/L   Chloride 81 (L) 98 - 111 mmol/L   CO2 28 22 - 32 mmol/L   Glucose, Bld 154 (H) 70 - 99 mg/dL  Comment: Glucose reference range applies only to samples taken after fasting for at least 8 hours.   BUN 87 (H) 8 - 23 mg/dL   Creatinine, Ser 1.68 (H) 0.44 - 1.00 mg/dL   Calcium 8.8 (L) 8.9 - 10.3 mg/dL   Total Protein 7.7 6.5 - 8.1 g/dL   Albumin 4.9 3.5 - 5.0 g/dL   AST 18 15 - 41 U/L   ALT 13 0 - 44 U/L   Alkaline Phosphatase 93 38 - 126 U/L   Total Bilirubin 1.0 0.3 - 1.2 mg/dL   GFR calc non Af Amer 31 (L) >60 mL/min   GFR calc Af Amer 36 (L) >60 mL/min   Anion gap 20 (H) 5 - 15    Comment: Performed at [facility], [place] [address], [place], [place] [address]  Lipase, blood     Status: Abnormal   Collection Time: 04/11/20  3:42 PM  Result Value Ref Range   Lipase 59 (H) 11 - 51 U/L    Comment: Performed at [facility], [place] [address] [place], [place] [address]  CBC with Differential     Status: Abnormal   Collection Time: 04/11/20  3:42 PM  Result Value Ref Range   WBC 20.8 (H) 4.0 - 10.5 K/uL   RBC 4.76 3.87 - 5.11 MIL/uL   Hemoglobin 16.3 (H) 12.0 - 15.0 g/dL   HCT 46.2 (H) 36.0 - 46.0 %   MCV 97.1 80.0 - 100.0 fL     MCH 34.2 (H) 26.0 - 34.0 pg   MCHC 35.3 30.0 - 36.0 g/dL   RDW 13.3 11.5 - 15.5 %   Platelets 211 150 - 400 K/uL    Comment: SPECIMEN CHECKED FOR CLOTS PLATELET COUNT CONFIRMED BY SMEAR REPEATED TO VERIFY    nRBC 0.0 0.0 - 0.2 %   Neutrophils Relative % 86 %   Neutro Abs 18.0 (H) 1.7 - 7.7 K/uL   Lymphocytes Relative 6 %   Lymphs Abs 1.3 0.7 - 4.0 K/uL   Monocytes Relative 7 %   Monocytes Absolute 1.4 (H) 0.1 - 1.0 K/uL   Eosinophils Relative 0 %   Eosinophils Absolute 0.0 0.0 - 0.5 K/uL   Basophils Relative 0 %   Basophils Absolute 0.0 0.0 - 0.1 K/uL   Immature Granulocytes 1 %   Abs Immature Granulocytes 0.10 (H) 0.00 - 0.07 K/uL    Comment: Performed at [facility], [place] [address] [place], [place] [address]  Magnesium     Status: None   Collection Time: 04/11/20  3:42 PM  Result Value Ref Range   Magnesium 2.1 1.7 - 2.4 mg/dL    Comment: Performed at [facility], [place] [address] [place],  [address]  Respiratory Panel by RT PCR (Flu A&B, Covid) - Nasopharyngeal Swab     Status: None   Collection Time: 04/11/20  6:49 PM   Specimen: Nasopharyngeal Swab  Result Value Ref Range   SARS Coronavirus 2 by RT PCR NEGATIVE NEGATIVE    Comment: (NOTE) SARS-CoV-2 target nucleic acids are NOT DETECTED. The SARS-CoV-2 RNA is generally detectable in upper respiratoy specimens during the acute phase of infection. The lowest concentration of SARS-CoV-2 viral copies this assay can detect is 131 copies/mL. A negative result does not preclude SARS-Cov-2 infection and should not be used as the sole basis for treatment or other patient management decisions. A negative result may occur with  improper specimen collection/handling, submission of specimen other  than nasopharyngeal swab, presence of viral mutation(s) within the areas targeted by this assay, and inadequate number of viral copies (<131 copies/mL). A negative result must be combined  with clinical observations, patient history, and epidemiological information. The expected result is Negative. Fact Sheet for Patients:  PinkCheek.be Fact Sheet for Healthcare Providers:  GravelBags.it This test is not yet ap proved or cleared by the [place] FDA and  has been authorized for detection and/or diagnosis of SARS-CoV-2 by FDA under an Emergency Use Authorization (EUA). This EUA will remain  in effect (meaning this test can be used) for the duration of the COVID-19 declaration under Section 564(b)(1) of the Act, 21 U.S.C. section 360bbb-3(b)(1), unless the authorization is terminated or revoked sooner.    Influenza A by PCR NEGATIVE NEGATIVE   Influenza B by PCR NEGATIVE NEGATIVE    Comment: (NOTE) The Xpert Xpress SARS-CoV-2/FLU/RSV assay is intended as an aid in  the diagnosis of influenza from Nasopharyngeal swab specimens and  should not be used as a sole basis for treatment. Nasal washings and  aspirates are unacceptable for Xpert Xpress SARS-CoV-2/FLU/RSV  testing. Fact Sheet for Patients: PinkCheek.be Fact Sheet for Healthcare Providers: GravelBags.it This test is not yet approved or cleared by the [place] FDA and  has been authorized for detection and/or diagnosis of SARS-CoV-2 by  FDA under an Emergency Use Authorization (EUA). This EUA will remain  in effect (meaning this test can be used) for the duration of the  Covid-19 declaration under Section 564(b)(1) of the Act, 21  U.S.C. section 360bbb-3(b)(1), unless the authorization is  terminated or revoked. Performed at [facility], [place] [address], [place], [place] [address]   Creatinine, urine, random     Status: None   Collection Time: 04/11/20  9:55 PM  Result Value Ref Range   Creatinine, Urine 85.26 mg/dL    Comment: Performed at [facility], [place] [address], [place], [place] [address]  Sodium, urine, random     Status: None   Collection Time: 04/11/20  9:55 PM  Result Value Ref Range   Sodium, Ur 17 mmol/L    Comment: Performed at [facility], [place] [address], [place], [place] [address]  Osmolality, urine     Status: None   Collection Time: 04/11/20  9:55 PM  Result Value Ref Range   Osmolality, Ur 640 300 - 900 mOsm/kg    Comment: Performed at [place] [address] [place], [place] [address]  Urinalysis, Complete w Microscopic     Status: Abnormal   Collection Time: 04/11/20 10:23 PM  Result Value Ref Range   Color, Urine YELLOW YELLOW   APPearance HAZY (A) CLEAR   Specific Gravity, Urine 1.045 (H) 1.005 - 1.030   pH 5.0 5.0 - 8.0   Glucose, UA NEGATIVE NEGATIVE mg/dL   Hgb urine dipstick NEGATIVE NEGATIVE   Bilirubin Urine NEGATIVE NEGATIVE   Ketones, ur NEGATIVE NEGATIVE mg/dL   Protein, ur NEGATIVE NEGATIVE mg/dL   Nitrite NEGATIVE NEGATIVE   Leukocytes,Ua NEGATIVE NEGATIVE   RBC / HPF 0-5 0 - 5 RBC/hpf   WBC, UA 11-20 0 - 5 WBC/hpf   Bacteria, UA NONE SEEN NONE SEEN   Squamous Epithelial / LPF 0-5 0 - 5   Mucus PRESENT     Comment: Performed at [facility], [place] [address] [place], [place] [address]  Magnesium     Status: None   Collection Time: 04/12/20  2:21 AM  Result Value Ref Range   Magnesium 2.2 1.7 - 2.4 mg/dL    Comment: Performed at [facility], [place] [address] [place], [place] [address]  Comprehensive metabolic panel     Status: Abnormal   Collection Time: 04/12/20  2:21 AM  Result Value Ref Range   Sodium 133 (L) 135 - 145 mmol/L   Potassium 3.1 (L) 3.5 - 5.1 mmol/L   Chloride 85 (L) 98 - 111 mmol/L   CO2 32 22 - 32 mmol/L   Glucose, Bld 124 (H) 70 - 99 mg/dL    Comment: Glucose reference range applies only to samples taken after fasting for at least 8 hours.   BUN 87 (H) 8 - 23 mg/dL   Creatinine, Ser 1.62  (H) 0.44 - 1.00 mg/dL   Calcium 8.5 (L) 8.9 - 10.3 mg/dL   Total Protein 7.4 6.5 - 8.1 g/dL   Albumin 4.6 3.5 - 5.0 g/dL   AST 16 15 - 41 U/L   ALT 12 0 - 44 U/L   Alkaline Phosphatase 93 38 - 126 U/L   Total Bilirubin 1.1 0.3 - 1.2 mg/dL   GFR calc non Af Amer 33 (L) >60 mL/min   GFR calc Af Amer 38 (L) >60 mL/min   Anion gap 16 (H) 5 - 15    Comment: Performed at [facility], [place] [address] [place], [place] [address]  CBC WITH DIFFERENTIAL     Status: Abnormal   Collection Time: 04/12/20  2:21 AM  Result Value Ref Range   WBC 19.3 (H) 4.0 - 10.5 K/uL   RBC 4.30 3.87 - 5.11 MIL/uL   Hemoglobin 14.7 12.0 - 15.0 g/dL   HCT 43.0 36.0 - 46.0 %   MCV 100.0 80.0 - 100.0 fL   MCH 34.2 (H) 26.0 - 34.0 pg   MCHC 34.2 30.0 - 36.0 g/dL   RDW 13.4 11.5 - 15.5 %   Platelets 250 150 - 400 K/uL   nRBC 0.0 0.0 - 0.2 %   Neutrophils Relative % 80 %   Neutro Abs 15.5 (H) 1.7 - 7.7 K/uL   Lymphocytes Relative 9 %   Lymphs Abs 1.8 0.7 - 4.0 K/uL   Monocytes Relative 9 %   Monocytes Absolute 1.7 (H) 0.1 - 1.0 K/uL   Eosinophils Relative 1 %   Eosinophils Absolute 0.1 0.0 - 0.5 K/uL   Basophils Relative 0 %   Basophils Absolute 0.1 0.0 - 0.1 K/uL   Immature Granulocytes 1 %   Abs Immature Granulocytes 0.14 (H) 0.00 - 0.07 K/uL    Comment: Performed at [facility], [place] [address] [place], [place] [address]  Protime-INR     Status: None   Collection Time: 04/12/20  2:21 AM  Result Value Ref Range   Prothrombin Time 11.9 11.4 - 15.2 seconds   INR 0.9 0.8 - 1.2    Comment: (NOTE) INR goal varies based on device and disease states. Performed at [facility], [place] [address] [place], [place] [address]   TSH     Status: None   Collection Time: 04/12/20  2:21 AM  Result Value Ref Range   TSH 1.174 0.350 - 4.500 uIU/mL    Comment: Performed by a 3rd Generation assay with a functional sensitivity of <=0.01 uIU/mL. Performed at [facility], [place] [address] [place],  [address]    CT ABDOMEN PELVIS W CONTRAST  Result Date: 04/11/2020  CLINICAL DATA:  Bowel obstruction. EXAM: CT ABDOMEN AND PELVIS WITH CONTRAST TECHNIQUE: Multidetector CT imaging of the abdomen and pelvis was performed using the standard protocol following bolus administration of intravenous contrast. CONTRAST:  57mL OMNIPAQUE IOHEXOL 300 MG/ML  SOLN COMPARISON:  CT abdomen pelvis 09/08/2019 FINDINGS: Lower chest: Scarring at the lung bases is stable. Previously noted subpleural nodule is not visualized on today's exam. Heart size is normal. No significant pleural or pericardial effusion is present. Hepatobiliary: An ill-defined mass lesion in the posterior right lobe measures 3.5 x 3.5 x 2.9 cm, increased in size since the prior exam. The second lesion along the inferior [place] lobe is no longer present. Granulomatous changes are noted. Pancreas: Pancreatic duct dilation is new. No mass lesion is present. Spleen: Granulomatous changes are present. Adrenals/Urinary Tract: Adrenal glands are normal bilaterally. Kidneys and ureters are unremarkable. No stone or mass lesion is present. Stomach/Bowel: Stomach is distended. Small bowel obstruction is present. Transition point is in the mid abdomen, best seen on sagittal image 62 of series 5. Transition is at the level of image 49 of series 2. Distal small bowel and colon are collapsed. No free air is present. Vascular/Lymphatic: Atherosclerotic calcifications are present in the aorta and branch vessels without aneurysm. Previously noted portacaval nodes have decreased in size. Retroperitoneal nodes have decreased in size. Left inguinal node measures up to 13 mm in short axis, slightly decreased in size. Paraspinal mass at L3 has diminished. Reproductive: Uterus and bilateral adnexa are unremarkable. Other: Adhesions are present. No free fluid is present. Ventral laxity is noted. Musculoskeletal: Lytic and  sclerotic lesions are again noted at L3, greatest on the left. No new osseous metastases are present. Advanced facet disease is present. The hips are located and within normal limits. IMPRESSION: 1. Small bowel obstruction with transition point in the mid abdomen. 2. Metastatic lesion in the posterior right lobe of the liver is slightly larger than on the prior study. 3. The second lesion along the inferior right hepatic lobe is no longer present. 4. Lytic and sclerotic lesions at L3, greatest on the left. No new osseous metastases are present. Adjacent paraspinal mass at L3 has diminished. 5. Decreased size of retroperitoneal and portacaval lymph nodes. 6. New pancreatic duct dilation without a discrete mass lesion. 7. Aortic Atherosclerosis (ICD10-I70.0). Electronically Signed   By: San Morelle M.D.   On: 04/11/2020 18:10   DG Abd Portable 1 View  Result Date: 04/11/2020 CLINICAL DATA:  Tube placement EXAM: PORTABLE ABDOMEN - 1 VIEW COMPARISON:  CT 04/11/2020 FINDINGS: Lung bases are clear. Esophageal tube tip and side port overlie the proximal stomach. Excreted contrast within the kidneys. IMPRESSION: Esophageal tube tip overlies the gastric fundus Electronically Signed   By: Donavan Foil M.D.   On: 04/11/2020 20:45   . lactated ringers 75 mL/hr at 04/12/20 0600     Assessment/Plan Chronic tobacco use Abdominal wall thrombus -on Xarelto Hx EtOH abuse Hyponatremia Hypokalemia  Metastatic colon cancer with right hemicolectomy 2016. Excision of malignant mass from the abdominal including skin, muscle, fascia and peritoneum (15 x 12 cm), abdominal reconstruction with [name], 10/03/17 Dr. Barry Dienes and [name] Ongoing chemotherapy  FEN:  NPO/NG/IV fluids ID: none DVT:  SCD's - can have IV heparin from our standpoint.  Would not start Xarelto back.   Follow up:  TBD  Plan:  Agree with NG decompression, IV fluid rehydration, she needs have her electrolytes corrected.  We will get  a plain film and do  the SBP.  With her surgical history we would try to avoid surgery if possible.  We will also ask Dr. Barry Dienes to review CT.       Earnstine Regal [facility] Surgery 04/12/2020, 7:51 AM Please see Amion for pager number during day hours 7:00am-4:30pm

## 2020-04-12 NOTE — Progress Notes (Signed)
IP PROGRESS NOTE  Subjective:   Yolanda Watson is well-known to me with a history of metastatic colon cancer.  She is currently being treated with FOLFIRI chemotherapy, last cycle given 03/16/2020.  She reports malaise lasting 4 days following chemotherapy.  No nausea/vomiting, diarrhea, or mouth sores. She reports feeling well until 04/06/2020 when she developed cramping abdominal discomfort, nausea, and constipation.  She developed progressive symptoms with vomiting and presented to the emergency room yesterday.  A CT confirmed a small bowel obstruction.  She reports no flatus or bowel movement today.  Objective: Vital signs in last 24 hours: Blood pressure 128/68, pulse 70, temperature 97.7 F (36.5 C), temperature source Oral, resp. rate 16, height '5\' 6"'$  (1.676 m), weight 108 lb 0.4 oz (49 kg), SpO2 100 %.  Intake/Output from previous day: 04/13 0701 - 04/14 0700 In: 1341 [P.O.:90; I.V.:251; IV Piggyback:1000] Out: 2530 [Urine:200; Emesis/NG output:2330]  Physical Exam:  HEENT: NG tube in place, no thrush Abdomen: Mildly distended, soft, no hepatomegaly, no mass Extremities: No leg edema Lymph nodes: Firm 2 cm left inguinal node  Portacath/PICC-without erythema  Lab Results: Recent Labs    04/11/20 1542 04/12/20 0221  WBC 20.8* 19.3*  HGB 16.3* 14.7  HCT 46.2* 43.0  PLT 211 250    BMET Recent Labs    04/11/20 1542 04/12/20 0221  NA 129* 133*  K 3.6 3.1*  CL 81* 85*  CO2 28 32  GLUCOSE 154* 124*  BUN 87* 87*  CREATININE 1.68* 1.62*  CALCIUM 8.8* 8.5*    Lab Results  Component Value Date   CEA1 6.52 (H) 03/16/2020    Studies/Results: CT ABDOMEN PELVIS W CONTRAST  Result Date: 04/11/2020 CLINICAL DATA:  Bowel obstruction. EXAM: CT ABDOMEN AND PELVIS WITH CONTRAST TECHNIQUE: Multidetector CT imaging of the abdomen and pelvis was performed using the standard protocol following bolus administration of intravenous contrast. CONTRAST:  49m OMNIPAQUE IOHEXOL 300  MG/ML  SOLN COMPARISON:  CT abdomen pelvis 09/08/2019 FINDINGS: Lower chest: Scarring at the lung bases is stable. Previously noted subpleural nodule is not visualized on today's exam. Heart size is normal. No significant pleural or pericardial effusion is present. Hepatobiliary: An ill-defined mass lesion in the posterior right lobe measures 3.5 x 3.5 x 2.9 cm, increased in size since the prior exam. The second lesion along the inferior Paddock lobe is no longer present. Granulomatous changes are noted. Pancreas: Pancreatic duct dilation is new. No mass lesion is present. Spleen: Granulomatous changes are present. Adrenals/Urinary Tract: Adrenal glands are normal bilaterally. Kidneys and ureters are unremarkable. No stone or mass lesion is present. Stomach/Bowel: Stomach is distended. Small bowel obstruction is present. Transition point is in the mid abdomen, best seen on sagittal image 62 of series 5. Transition is at the level of image 49 of series 2. Distal small bowel and colon are collapsed. No free air is present. Vascular/Lymphatic: Atherosclerotic calcifications are present in the aorta and branch vessels without aneurysm. Previously noted portacaval nodes have decreased in size. Retroperitoneal nodes have decreased in size. Left inguinal node measures up to 13 mm in short axis, slightly decreased in size. Paraspinal mass at L3 has diminished. Reproductive: Uterus and bilateral adnexa are unremarkable. Other: Adhesions are present. No free fluid is present. Ventral laxity is noted. Musculoskeletal: Lytic and sclerotic lesions are again noted at L3, greatest on the left. No new osseous metastases are present. Advanced facet disease is present. The hips are located and within normal limits. IMPRESSION: 1. Small bowel obstruction  with transition point in the mid abdomen. 2. Metastatic lesion in the posterior right lobe of the liver is slightly larger than on the prior study. 3. The second lesion along the  inferior right hepatic lobe is no longer present. 4. Lytic and sclerotic lesions at L3, greatest on the left. No new osseous metastases are present. Adjacent paraspinal mass at L3 has diminished. 5. Decreased size of retroperitoneal and portacaval lymph nodes. 6. New pancreatic duct dilation without a discrete mass lesion. 7. Aortic Atherosclerosis (ICD10-I70.0). Electronically Signed   By: San Morelle M.D.   On: 04/11/2020 18:10   DG Abd Portable 1 View  Result Date: 04/11/2020 CLINICAL DATA:  Tube placement EXAM: PORTABLE ABDOMEN - 1 VIEW COMPARISON:  CT 04/11/2020 FINDINGS: Lung bases are clear. Esophageal tube tip and side port overlie the proximal stomach. Excreted contrast within the kidneys. IMPRESSION: Esophageal tube tip overlies the gastric fundus Electronically Signed   By: Donavan Foil M.D.   On: 04/11/2020 20:45    Medications: I have reviewed the patient's current medications.  Assessment/Plan:  1. Metastatic colon cancer presenting with an Abdominal/pelvic mass  2. [place]pathology report dated 02/15/2015-invasive adenocarcinoma, well-differentiated with prominent mucinous features; microscopic tumor extension into mesenteric adipose tissue and focally involving visceral peritoneum; radial and mucosal margins negative; lymph-vascular invasion withsuspicious foci identified;perineural invasion not identified; 16 negative lymph nodes; tumor deposits not identified; [id number];MSI stable  ? CT 04/22/2017 revealed a heterogenous anterior pelvic wall mass, right inguinal mass, top normal sized retroperitoneal and iliac nodes, abutment of the anterior dome of the bladder and a loop of small bowel ? 04/29/2017 biopsy of anterior abdominal/pelvic wall mass-acellular mucin dissecting fibrous stroma ? Cycle 1 FOLFOX 05/15/2017 ? Cycle 2 FOLFOX 05/28/2017 ? Cycle 3 FOLFOX with Avastin on 06/11/2017 ? Cycle 4 FOLFOX/Avastin 06/25/2017 ? Cycle 5  FOLFOX/Avastin 07/10/2017, Neulasta added ? Restaging CT 07/21/2017-mild decrease in the size of a right inguinal node and the anterior abdominal wall mass ? Radiation to the abdominal wall mass 08/18/2017-09/03/2017 ? Status post excision of abdominal wall mass 10/03/2017--pathology showed adenocarcinoma with excessive extracellular mucin. Margins negative for tumor. Right inguinal wall mass also excised showing metastatic adenocarcinoma with excessive extracellular mucin in 1 of 8 lymph nodes. ? CT abdomen/pelvis 06/16/2019-new right hepatic lobe lesion measuring 2.7 x 1.7 cm. Hypoattenuation within the pancreatic uncinate process measuring 1.5 x 1.6 cm. Mild left adrenal thickening. Upper pole left renal mass 3.0 x 3.6 cm. Infiltrative hypoenhancement involving the lower pole left kidney. Nonocclusive thrombus within the infrarenal IVC. Necrotic adenopathy within the small bowel mesentery, new. New necrotic left inguinal nodes. Lytic lesion within and adjacent the anterior side of the L3 vertebral body. Contiguous mass within the left psoas measuring 4.6 x 3.4 cm. ? Cycle 1 FOLFIRI 06/23/2019 ? Cycle 2 FOLFIRI 07/08/2019, Udenyca added ? Cycle 3 FOLFIRI 07/22/2019 ? Cycle 4 FOLFIRI 08/04/2019 ? Cycle 5 FOLFIRI 08/18/2019 ? CTs 09/08/2019-3.4 x 2.9 cm metastasis in segment 7 liver, previously 2.7 x 1.7 cm. 14 mm lesion/implant along the inferior right hepatic lobe, possibly new or possibly related to the prior peritoneal implant along the right paracolic gutter. 4 x 6 mm subpleural nodule right lower lobe not clearly evident on the prior study. Near complete resolution of prior heterogeneous enhancement involving the bilateral kidneys which likely reflected pyelonephritis. Near complete resolution of prior lymphadenopathy along with small bowel mesentery. Mild left inguinal lymphadenopathy measuring up to 14 mm, previously 15 mm. Lytic metastasis at  L3 with associated soft tissue metastasis  involving the left psoas muscle improved. ? Cycle 6 FOLFIRI 09/16/2019, Udenyca ? Cycle 7 FOLFIRI 09/30/2019 ? Treatment break ? Cycle 8 FOLFIRI 01/06/2020 ? Cycle 9 FOLFIRI 01/26/2020 ? Cycle 10 FOLFIRI 02/24/2020 ? Cycle 11 FOLFIRI 03/16/2020 ? CT 04/11/2020-small bowel obstruction, lesion inferior right hepatic lobe no longer present, paraspinous mass at L3 decreased in size, decreased size of retroperitoneal/portacaval nodes, right liver lesion not significantly changed   3. Ongoing tobacco and alcohol use  4. Mild neutropenia related to chemotherapy 07/10/2017. Neulasta added.  5.Admission January 2019 with an abdominal wall abscess and pubis osteomyelitis, status post debridement and antibiotics  6.Pain secondary to #1. 7.CT 06/16/2019 with nonocclusive thrombus infrarenal IVC. Anticoagulation initiated. 8.Left patella fracture following a fall 08/20/2019 9.  Admission 04/11/2020 with a small bowel obstruction 10.  Acute renal failure 04/11/2020  Yolanda Watson is admitted with a small bowel obstruction.  She was most recently treated with FOLFIRI chemotherapy on 03/16/2020.  The bowel obstruction may be related to adhesions versus carcinomatosis.  I reviewed the 04/11/2020 CT images.  Adenopathy and the paraspinous mass appears smaller.  The right liver lesion is unchanged by my measurement.  There is no clear evidence of disease progression.  This is despite significant treatment breaks since the baseline CT in September.  Recommendations: 1.  Management of small bowel obstruction per the surgical service 2.  Hydration, follow creatinine 3.  She had been maintained on anticoagulation therapy for an IVC thrombus noted on CT, recommend SCDs, initiate Lovenox prophylaxis when the creatinine improves 4.  Outpatient follow-up will be scheduled at the Cancer center with the plan to continue FOLFIRI chemotherapy pending her recovery from the bowel obstruction   LOS: 1  day   Betsy Coder, MD   04/12/2020, 9:01 AM

## 2020-04-13 ENCOUNTER — Inpatient Hospital Stay (HOSPITAL_COMMUNITY): Payer: Medicare Other

## 2020-04-13 DIAGNOSIS — K56609 Unspecified intestinal obstruction, unspecified as to partial versus complete obstruction: Secondary | ICD-10-CM | POA: Diagnosis not present

## 2020-04-13 DIAGNOSIS — R112 Nausea with vomiting, unspecified: Secondary | ICD-10-CM | POA: Diagnosis not present

## 2020-04-13 DIAGNOSIS — E871 Hypo-osmolality and hyponatremia: Secondary | ICD-10-CM | POA: Diagnosis not present

## 2020-04-13 DIAGNOSIS — N179 Acute kidney failure, unspecified: Secondary | ICD-10-CM | POA: Diagnosis not present

## 2020-04-13 LAB — BASIC METABOLIC PANEL
Anion gap: 15 (ref 5–15)
BUN: 55 mg/dL — ABNORMAL HIGH (ref 8–23)
CO2: 35 mmol/L — ABNORMAL HIGH (ref 22–32)
Calcium: 9 mg/dL (ref 8.9–10.3)
Chloride: 86 mmol/L — ABNORMAL LOW (ref 98–111)
Creatinine, Ser: 1.17 mg/dL — ABNORMAL HIGH (ref 0.44–1.00)
GFR calc Af Amer: 56 mL/min — ABNORMAL LOW (ref >60)
GFR calc non Af Amer: 48 mL/min — ABNORMAL LOW (ref >60)
Glucose, Bld: 109 mg/dL — ABNORMAL HIGH (ref 70–99)
Potassium: 3.5 mmol/L (ref 3.5–5.1)
Sodium: 136 mmol/L (ref 135–145)

## 2020-04-13 MED ORDER — ENOXAPARIN SODIUM 40 MG/0.4ML ~~LOC~~ SOLN
40.0000 mg | SUBCUTANEOUS | Status: DC
  Administered 2020-04-13 – 2020-04-20 (×8): 40 mg via SUBCUTANEOUS
  Filled 2020-04-13 (×8): qty 0.4

## 2020-04-13 NOTE — Progress Notes (Signed)
PROGRESS NOTE    Yolanda Watson  [id number] DOB: 1951-03-27 DOA: 04/11/2020 PCP: Patient, No Pcp Per   Brief Narrative: Yolanda Watson is a 69 y.o. 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 Patient presented secondary to abdominal pain and found to have an SBO.   Assessment & Plan:   Principal Problem:   SBO (small bowel obstruction) (HCC) Active Problems:   Abdominal pain   Nausea & vomiting   Acute hyponatremia   AKI (acute kidney injury) ([place])   Leukocytosis   Partial small bowel obstruction CT scan significant for SBO with transition point located in the midabdomen. History of multiple intraabdominal surgeries. NG tube placed and on intermittent suction. NG output of 1.8 L in the last 24 hours. -General surgery consult: non-operative management for now, NG tube decompression, SBO protocol  Hyponatremia Likely secondary to some mild hypovolemia. Resolved with IV fluids.  Hypokalemia -Replete as needed  AKI Baseline creatinine of 0.8. Creatinine of 1.68 on admission. BUN significantly elevated. -Continue LR fluids  Leukocytosis In setting of SBO. Mildly improved. No evidence of infection. -Trend CBC  Metastatic colon cancer Patient is currently on chemotherapy. Next cycle planned for about one week.  Pancreatic duct dilation Incidental finding on CT abdomen/pelvis. No mass lesion noted. Patient is currently asymptomatic. Lipase minimally elevated. Will need outpatient monitoring.  Abdominal wall thrombus On Xarelto as an outpatient. Held on admission -VTE prophylaxis with Lovenox   DVT prophylaxis: Lovenox Code Status:   Code Status: DNR Family Communication: None at bedside Disposition Plan: Discharge pending continued general surgery recommendations   Consultants:   General surgery  Procedures:   NG tube (4/13>>  Antimicrobials:  None     Subjective: No abdominal pain this morning. Intermittent nausea. She has not had a bowel movement or flatus.  Objective: Vitals:   04/12/20 1744 04/12/20 2146 04/12/20 2200 04/13/20 0443  BP: 118/74 131/72  121/64  Pulse: 83 77  68  Resp: 15 12 (!) 24 12  Temp: 98.3 F (36.8 C) 97.6 F (36.4 C)  (!) 97.5 F (36.4 C)  TempSrc: Oral Oral  Oral  SpO2: 93% 93%  93%  Weight:    49.6 kg  Height:        Intake/Output Summary (Last 24 hours) at 04/13/2020 0823 Last data filed at 04/13/2020 0600 Gross per 24 hour  Intake 2127.42 ml  Output 2430 ml  Net -302.58 ml   Filed Weights   04/12/20 0500 04/12/20 0536 04/13/20 0443  Weight: 47.2 kg 49 kg 49.6 kg    Examination:  General exam: Appears calm and comfortable. NG tube in right nostril. Respiratory system: Clear to auscultation. Respiratory effort normal. Cardiovascular system: S1 & S2 heard, RRR. No murmurs, rubs, gallops or clicks. Gastrointestinal system: Abdomen is nondistended, soft and nontender. No organomegaly or masses felt. Decreased bowel sounds heard. Central nervous system: Alert and oriented. No focal neurological deficits. Extremities: No edema. No calf tenderness Skin: No cyanosis. No rashes Psychiatry: Judgement and insight appear normal. Mood & affect appropriate.     Data Reviewed: I have personally reviewed following labs and imaging studies  CBC: Recent Labs  Lab 04/11/20 1542 04/12/20 0221  WBC 20.8* 19.3*  NEUTROABS 18.0* 15.5*  HGB 16.3* 14.7  HCT 46.2* 43.0  MCV 97.1 100.0  PLT 211 [id number]   Basic Metabolic Panel: Recent Labs  Lab 04/11/20 1542 04/12/20 0221 04/13/20 0456  NA 129*  133* 136  K 3.6 3.1* 3.5  CL 81* 85* 86*  CO2 28 32 35*  GLUCOSE 154* 124* 109*  BUN 87* 87* 55*  CREATININE 1.68* 1.62* 1.17*  CALCIUM 8.8* 8.5* 9.0  MG 2.1 2.2  --    GFR: Estimated Creatinine Clearance: 36.5 mL/min (A) (by C-G formula based on SCr of 1.17 mg/dL (H)). Liver Function  Tests: Recent Labs  Lab 04/11/20 1542 04/12/20 0221  AST 18 16  ALT 13 12  ALKPHOS 93 93  BILITOT 1.0 1.1  PROT 7.7 7.4  ALBUMIN 4.9 4.6   Recent Labs  Lab 04/11/20 1542  LIPASE 59*   No results for input(s): AMMONIA in the last 168 hours. Coagulation Profile: Recent Labs  Lab 04/12/20 0221  INR 0.9   Cardiac Enzymes: No results for input(s): CKTOTAL, CKMB, CKMBINDEX, TROPONINI in the last 168 hours. BNP (last 3 results) No results for input(s): PROBNP in the last 8760 hours. HbA1C: No results for input(s): HGBA1C in the last 72 hours. CBG: No results for input(s): GLUCAP in the last 168 hours. Lipid Profile: No results for input(s): CHOL, HDL, LDLCALC, TRIG, CHOLHDL, LDLDIRECT in the last 72 hours. Thyroid Function Tests: Recent Labs    04/12/20 0221  TSH 1.174   Anemia Panel: No results for input(s): VITAMINB12, FOLATE, FERRITIN, TIBC, IRON, RETICCTPCT in the last 72 hours. Sepsis Labs: No results for input(s): PROCALCITON, LATICACIDVEN in the last 168 hours.  Recent Results (from the past 240 hour(s))  Respiratory Panel by RT PCR (Flu A&B, Covid) - Nasopharyngeal Swab     Status: None   Collection Time: 04/11/20  6:49 PM   Specimen: Nasopharyngeal Swab  Result Value Ref Range Status   SARS Coronavirus 2 by RT PCR NEGATIVE NEGATIVE Final    Comment: (NOTE) SARS-CoV-2 target nucleic acids are NOT DETECTED. The SARS-CoV-2 RNA is generally detectable in upper respiratoy specimens during the acute phase of infection. The lowest concentration of SARS-CoV-2 viral copies this assay can detect is 131 copies/mL. A negative result does not preclude SARS-Cov-2 infection and should not be used as the sole basis for treatment or other patient management decisions. A negative result may occur with  improper specimen collection/handling, submission of specimen other than nasopharyngeal swab, presence of viral mutation(s) within the areas targeted by this assay, and  inadequate number of viral copies (<131 copies/mL). A negative result must be combined with clinical observations, patient history, and epidemiological information. The expected result is Negative. Fact Sheet for Patients:  PinkCheek.be Fact Sheet for Healthcare Providers:  GravelBags.it This test is not yet ap proved or cleared by the [place] FDA and  has been authorized for detection and/or diagnosis of SARS-CoV-2 by FDA under an Emergency Use Authorization (EUA). This EUA will remain  in effect (meaning this test can be used) for the duration of the COVID-19 declaration under Section 564(b)(1) of the Act, 21 U.S.C. section 360bbb-3(b)(1), unless the authorization is terminated or revoked sooner.    Influenza A by PCR NEGATIVE NEGATIVE Final   Influenza B by PCR NEGATIVE NEGATIVE Final    Comment: (NOTE) The Xpert Xpress SARS-CoV-2/FLU/RSV assay is intended as an aid in  the diagnosis of influenza from Nasopharyngeal swab specimens and  should not be used as a sole basis for treatment. Nasal washings and  aspirates are unacceptable for Xpert Xpress SARS-CoV-2/FLU/RSV  testing. Fact Sheet for Patients: PinkCheek.be Fact Sheet for Healthcare Providers: GravelBags.it This test is not yet approved or cleared by the  [place] Architectural technologist and  has been authorized for detection and/or diagnosis of SARS-CoV-2 by  FDA under an Print production planner (EUA). This EUA will remain  in effect (meaning this test can be used) for the duration of the  Covid-19 declaration under Section 564(b)(1) of the Act, 21  U.S.C. section 360bbb-3(b)(1), unless the authorization is  terminated or revoked. Performed at [facility], [place] [address] [place],  [address]          Radiology Studies: DG Abd 1 View  Result Date: 04/13/2020 CLINICAL DATA:   Small bowel obstruction EXAM: ABDOMEN - 1 VIEW COMPARISON:  04/12/2020 FINDINGS: Dilated small bowel loops noted compatible with small bowel obstruction. Oral contrast material appears to now have entered the right colon compatible with partial small bowel obstruction, but likely high-grade. NG tube is in the stomach. No free air organomegaly. IMPRESSION: Continued partial small bowel obstruction pattern, likely high-grade. Electronically Signed   By: Rolm Baptise M.D.   On: 04/13/2020 07:53   CT ABDOMEN PELVIS W CONTRAST  Result Date: 04/11/2020 CLINICAL DATA:  Bowel obstruction. EXAM: CT ABDOMEN AND PELVIS WITH CONTRAST TECHNIQUE: Multidetector CT imaging of the abdomen and pelvis was performed using the standard protocol following bolus administration of intravenous contrast. CONTRAST:  35mL OMNIPAQUE IOHEXOL 300 MG/ML  SOLN COMPARISON:  CT abdomen pelvis 09/08/2019 FINDINGS: Lower chest: Scarring at the lung bases is stable. Previously noted subpleural nodule is not visualized on today's exam. Heart size is normal. No significant pleural or pericardial effusion is present. Hepatobiliary: An ill-defined mass lesion in the posterior right lobe measures 3.5 x 3.5 x 2.9 cm, increased in size since the prior exam. The second lesion along the inferior [place] lobe is no longer present. Granulomatous changes are noted. Pancreas: Pancreatic duct dilation is new. No mass lesion is present. Spleen: Granulomatous changes are present. Adrenals/Urinary Tract: Adrenal glands are normal bilaterally. Kidneys and ureters are unremarkable. No stone or mass lesion is present. Stomach/Bowel: Stomach is distended. Small bowel obstruction is present. Transition point is in the mid abdomen, best seen on sagittal image 62 of series 5. Transition is at the level of image 49 of series 2. Distal small bowel and colon are collapsed. No free air is present. Vascular/Lymphatic: Atherosclerotic calcifications are present in the aorta and  branch vessels without aneurysm. Previously noted portacaval nodes have decreased in size. Retroperitoneal nodes have decreased in size. Left inguinal node measures up to 13 mm in short axis, slightly decreased in size. Paraspinal mass at L3 has diminished. Reproductive: Uterus and bilateral adnexa are unremarkable. Other: Adhesions are present. No free fluid is present. Ventral laxity is noted. Musculoskeletal: Lytic and sclerotic lesions are again noted at L3, greatest on the left. No new osseous metastases are present. Advanced facet disease is present. The hips are located and within normal limits. IMPRESSION: 1. Small bowel obstruction with transition point in the mid abdomen. 2. Metastatic lesion in the posterior right lobe of the liver is slightly larger than on the prior study. 3. The second lesion along the inferior right hepatic lobe is no longer present. 4. Lytic and sclerotic lesions at L3, greatest on the left. No new osseous metastases are present. Adjacent paraspinal mass at L3 has diminished. 5. Decreased size of retroperitoneal and portacaval lymph nodes. 6. New pancreatic duct dilation without a discrete mass lesion. 7. Aortic Atherosclerosis (ICD10-I70.0). Electronically Signed   By: San Morelle M.D.   On: 04/11/2020 18:10   DG Abd  Portable 1V-Small Bowel Obstruction Protocol-initial, 8 hr delay  Result Date: 04/12/2020 CLINICAL DATA:  Small-bowel obstruction EXAM: PORTABLE ABDOMEN - 1 VIEW COMPARISON:  04/11/2020 FINDINGS: Supine frontal view of the abdomen and pelvis was performed approximately 8 hours after administration of contrast via indwelling enteric catheter. Enteric catheter again seen within the gastric fundus. Contrast is seen outlining the stomach and multiple dilated loops of small bowel. There is no colonic contrast seen on this exam. Overall, caliber of the small bowel is not appreciably changed since prior studies. IMPRESSION: 1. Continued dilated loops of small  bowel consistent with small-bowel obstruction. 2. No evidence of oral contrast within the colon. Electronically Signed   By: Randa Ngo M.D.   On: 04/12/2020 21:27   DG Abd Portable 1 View  Result Date: 04/11/2020 CLINICAL DATA:  Tube placement EXAM: PORTABLE ABDOMEN - 1 VIEW COMPARISON:  CT 04/11/2020 FINDINGS: Lung bases are clear. Esophageal tube tip and side port overlie the proximal stomach. Excreted contrast within the kidneys. IMPRESSION: Esophageal tube tip overlies the gastric fundus Electronically Signed   By: Donavan Foil M.D.   On: 04/11/2020 20:45        Scheduled Meds: . Chlorhexidine Gluconate Cloth  6 each Topical Daily  . enoxaparin (LOVENOX) injection  30 mg Subcutaneous Q24H  . sodium chloride flush  3 mL Intravenous Q12H   Continuous Infusions: . lactated ringers 75 mL/hr at 04/13/20 0200     LOS: 2 days     Cordelia Poche, MD [facility] 04/13/2020, 8:23 AM  If 7PM-7AM, please contact night-coverage [email or web address]

## 2020-04-13 NOTE — Progress Notes (Signed)
[facility] Progress Note     Subjective: CC-  Feeling better than yesterday. Denies any current abdominal pain, n/v. No flatus or BM but she feels less distended. NG tube with 1880cc out last 24 hours. Xray shows persistent SBO, some oral contrast in the right colon.  Objective: Vital signs in last 24 hours: Temp:  [97.5 F (36.4 C)-98.3 F (36.8 C)] 97.5 F (36.4 C) (04/15 0443) Pulse Rate:  [66-83] 68 (04/15 0443) Resp:  [12-24] 12 (04/15 0443) BP: (118-131)/(64-74) 121/64 (04/15 0443) SpO2:  [93 %-97 %] 93 % (04/15 0443) Weight:  [49.6 kg] 49.6 kg (04/15 0443) Last BM Date: 04/06/20  Intake/Output from previous day: 04/14 0701 - 04/15 0700 In: 2127.4 [P.O.:270; I.V.:1466.7; IV Piggyback:390.8] Out: 2430 [Urine:550; Emesis/NG output:1880] Intake/Output this shift: Total I/O In: -  Out: 200 [Urine:200]  PE: Gen:  Alert, NAD, pleasant HEENT: EOM's intact, pupils equal and round Pulm:  rate and effort normal Abd: Soft, minimal distension, +BS high pitched, nontender Ext:  no BUE/BLE edema, calves soft and nontender  Lab Results:  Recent Labs    04/11/20 1542 04/12/20 0221  WBC 20.8* 19.3*  HGB 16.3* 14.7  HCT 46.2* 43.0  PLT 211 250   BMET Recent Labs    04/12/20 0221 04/13/20 0456  NA 133* 136  K 3.1* 3.5  CL 85* 86*  CO2 32 35*  GLUCOSE 124* 109*  BUN 87* 55*  CREATININE 1.62* 1.17*  CALCIUM 8.5* 9.0   PT/INR Recent Labs    04/12/20 0221  LABPROT 11.9  INR 0.9   CMP     Component Value Date/Time   NA 136 04/13/2020 0456   NA 141 01/15/2018 0000   NA 138 08/07/2017 0835   K 3.5 04/13/2020 0456   K 4.1 08/07/2017 0835   CL 86 (L) 04/13/2020 0456   CO2 35 (H) 04/13/2020 0456   CO2 25 08/07/2017 0835   GLUCOSE 109 (H) 04/13/2020 0456   GLUCOSE 95 08/07/2017 0835   BUN 55 (H) 04/13/2020 0456   BUN 4 01/15/2018 0000   BUN 9.3 08/07/2017 0835   CREATININE 1.17 (H) 04/13/2020 0456   CREATININE 0.80 03/16/2020 1002    CREATININE 0.57 02/05/2018 1144   CREATININE 0.7 08/07/2017 0835   CALCIUM 9.0 04/13/2020 0456   CALCIUM 9.4 08/07/2017 0835   PROT 7.4 04/12/2020 0221   PROT 7.3 08/07/2017 0835   ALBUMIN 4.6 04/12/2020 0221   ALBUMIN 3.4 (L) 08/07/2017 0835   AST 16 04/12/2020 0221   AST 13 (L) 03/16/2020 1002   AST 25 08/07/2017 0835   ALT 12 04/12/2020 0221   ALT 11 03/16/2020 1002   ALT 21 08/07/2017 0835   ALKPHOS 93 04/12/2020 0221   ALKPHOS 138 08/07/2017 0835   BILITOT 1.1 04/12/2020 0221   BILITOT 0.4 03/16/2020 1002   BILITOT 0.49 08/07/2017 0835   GFRNONAA 48 (L) 04/13/2020 0456   GFRNONAA >60 03/16/2020 1002   GFRAA 56 (L) 04/13/2020 0456   GFRAA >60 03/16/2020 1002   Lipase     Component Value Date/Time   LIPASE 59 (H) 04/11/2020 1542       Studies/Results: DG Abd 1 View  Result Date: 04/13/2020 CLINICAL DATA:  Small bowel obstruction EXAM: ABDOMEN - 1 VIEW COMPARISON:  04/12/2020 FINDINGS: Dilated small bowel loops noted compatible with small bowel obstruction. Oral contrast material appears to now have entered the right colon compatible with partial small bowel obstruction, but likely high-grade. NG tube is in the  stomach. No free air organomegaly. IMPRESSION: Continued partial small bowel obstruction pattern, likely high-grade. Electronically Signed   By: Rolm Baptise M.D.   On: 04/13/2020 07:53   CT ABDOMEN PELVIS W CONTRAST  Result Date: 04/11/2020 CLINICAL DATA:  Bowel obstruction. EXAM: CT ABDOMEN AND PELVIS WITH CONTRAST TECHNIQUE: Multidetector CT imaging of the abdomen and pelvis was performed using the standard protocol following bolus administration of intravenous contrast. CONTRAST:  52mL OMNIPAQUE IOHEXOL 300 MG/ML  SOLN COMPARISON:  CT abdomen pelvis 09/08/2019 FINDINGS: Lower chest: Scarring at the lung bases is stable. Previously noted subpleural nodule is not visualized on today's exam. Heart size is normal. No significant pleural or pericardial effusion is  present. Hepatobiliary: An ill-defined mass lesion in the posterior right lobe measures 3.5 x 3.5 x 2.9 cm, increased in size since the prior exam. The second lesion along the inferior [place] lobe is no longer present. Granulomatous changes are noted. Pancreas: Pancreatic duct dilation is new. No mass lesion is present. Spleen: Granulomatous changes are present. Adrenals/Urinary Tract: Adrenal glands are normal bilaterally. Kidneys and ureters are unremarkable. No stone or mass lesion is present. Stomach/Bowel: Stomach is distended. Small bowel obstruction is present. Transition point is in the mid abdomen, best seen on sagittal image 62 of series 5. Transition is at the level of image 49 of series 2. Distal small bowel and colon are collapsed. No free air is present. Vascular/Lymphatic: Atherosclerotic calcifications are present in the aorta and branch vessels without aneurysm. Previously noted portacaval nodes have decreased in size. Retroperitoneal nodes have decreased in size. Left inguinal node measures up to 13 mm in short axis, slightly decreased in size. Paraspinal mass at L3 has diminished. Reproductive: Uterus and bilateral adnexa are unremarkable. Other: Adhesions are present. No free fluid is present. Ventral laxity is noted. Musculoskeletal: Lytic and sclerotic lesions are again noted at L3, greatest on the left. No new osseous metastases are present. Advanced facet disease is present. The hips are located and within normal limits. IMPRESSION: 1. Small bowel obstruction with transition point in the mid abdomen. 2. Metastatic lesion in the posterior right lobe of the liver is slightly larger than on the prior study. 3. The second lesion along the inferior right hepatic lobe is no longer present. 4. Lytic and sclerotic lesions at L3, greatest on the left. No new osseous metastases are present. Adjacent paraspinal mass at L3 has diminished. 5. Decreased size of retroperitoneal and portacaval lymph nodes.  6. New pancreatic duct dilation without a discrete mass lesion. 7. Aortic Atherosclerosis (ICD10-I70.0). Electronically Signed   By: San Morelle M.D.   On: 04/11/2020 18:10   DG Abd Portable 1V-Small Bowel Obstruction Protocol-initial, 8 hr delay  Result Date: 04/12/2020 CLINICAL DATA:  Small-bowel obstruction EXAM: PORTABLE ABDOMEN - 1 VIEW COMPARISON:  04/11/2020 FINDINGS: Supine frontal view of the abdomen and pelvis was performed approximately 8 hours after administration of contrast via indwelling enteric catheter. Enteric catheter again seen within the gastric fundus. Contrast is seen outlining the stomach and multiple dilated loops of small bowel. There is no colonic contrast seen on this exam. Overall, caliber of the small bowel is not appreciably changed since prior studies. IMPRESSION: 1. Continued dilated loops of small bowel consistent with small-bowel obstruction. 2. No evidence of oral contrast within the colon. Electronically Signed   By: Randa Ngo M.D.   On: 04/12/2020 21:27   DG Abd Portable 1 View  Result Date: 04/11/2020 CLINICAL DATA:  Tube placement EXAM: PORTABLE ABDOMEN -  1 VIEW COMPARISON:  CT 04/11/2020 FINDINGS: Lung bases are clear. Esophageal tube tip and side port overlie the proximal stomach. Excreted contrast within the kidneys. IMPRESSION: Esophageal tube tip overlies the gastric fundus Electronically Signed   By: Donavan Foil M.D.   On: 04/11/2020 20:45    Anti-infectives: Anti-infectives (From admission, onward)   None       Assessment/Plan Chronic tobacco use Abdominal wall thrombus -on Xarelto Hx EtOH abuse AKI  Metastatic colon cancer with right hemicolectomy 2016 - ongoing chemotherapy, followed by Dr. Benay Spice -Excision of malignant mass from the abdominal including skin, muscle, fascia and peritoneum (15 x 12 cm), abdominal reconstruction with [name], 10/03/17 Dr. Barry Dienes and [name]  SBO - CT 4/13 showed Small bowel  obstruction with transition point in the mid abdomen - started SB protocol 4/14  FEN:  NPO/NG/IV fluids ID: none DVT:  SCD's, lovenox Foley: none Follow up:  TBD  Plan: Xray shows some contrast in right colon but persistently dilated loops of small bowel. She has had no return in bowel function with high volume NG tube output. Continue NPO, NGT to LIWS. Repeat xray in AM.   LOS: 2 days    Wellington [place], [facility] 04/13/2020, 10:03 AM Please see Amion for pager number during day hours 7:00am-4:30pm

## 2020-04-14 ENCOUNTER — Inpatient Hospital Stay (HOSPITAL_COMMUNITY): Payer: Medicare Other

## 2020-04-14 DIAGNOSIS — N179 Acute kidney failure, unspecified: Secondary | ICD-10-CM | POA: Diagnosis not present

## 2020-04-14 DIAGNOSIS — E871 Hypo-osmolality and hyponatremia: Secondary | ICD-10-CM | POA: Diagnosis not present

## 2020-04-14 DIAGNOSIS — K56609 Unspecified intestinal obstruction, unspecified as to partial versus complete obstruction: Secondary | ICD-10-CM | POA: Diagnosis not present

## 2020-04-14 DIAGNOSIS — R112 Nausea with vomiting, unspecified: Secondary | ICD-10-CM | POA: Diagnosis not present

## 2020-04-14 LAB — BASIC METABOLIC PANEL
Anion gap: 16 — ABNORMAL HIGH (ref 5–15)
BUN: 36 mg/dL — ABNORMAL HIGH (ref 8–23)
CO2: 32 mmol/L (ref 22–32)
Calcium: 8.6 mg/dL — ABNORMAL LOW (ref 8.9–10.3)
Chloride: 88 mmol/L — ABNORMAL LOW (ref 98–111)
Creatinine, Ser: 0.77 mg/dL (ref 0.44–1.00)
GFR calc Af Amer: >60 mL/min (ref >60)
GFR calc non Af Amer: >60 mL/min (ref >60)
Glucose, Bld: 82 mg/dL (ref 70–99)
Potassium: 3.2 mmol/L — ABNORMAL LOW (ref 3.5–5.1)
Sodium: 136 mmol/L (ref 135–145)

## 2020-04-14 LAB — CBC
HCT: 39.1 % (ref 36.0–46.0)
Hemoglobin: 12.6 g/dL (ref 12.0–15.0)
MCH: 34.1 pg — ABNORMAL HIGH (ref 26.0–34.0)
MCHC: 32.2 g/dL (ref 30.0–36.0)
MCV: 105.7 fL — ABNORMAL HIGH (ref 80.0–100.0)
Platelets: 219 10*3/uL (ref 150–400)
RBC: 3.7 MIL/uL — ABNORMAL LOW (ref 3.87–5.11)
RDW: 13 % (ref 11.5–15.5)
WBC: 18.6 10*3/uL — ABNORMAL HIGH (ref 4.0–10.5)
nRBC: 0 % (ref 0.0–0.2)

## 2020-04-14 LAB — PREALBUMIN: Prealbumin: 14.7 mg/dL — ABNORMAL LOW (ref 18–38)

## 2020-04-14 MED ORDER — BISACODYL 10 MG RE SUPP
10.0000 mg | Freq: Once | RECTAL | Status: AC
  Administered 2020-04-14: 10 mg via RECTAL
  Filled 2020-04-14: qty 1

## 2020-04-14 MED ORDER — POTASSIUM CHLORIDE 10 MEQ/100ML IV SOLN
10.0000 meq | INTRAVENOUS | Status: AC
  Administered 2020-04-14 (×4): 10 meq via INTRAVENOUS
  Filled 2020-04-14 (×4): qty 100

## 2020-04-14 NOTE — Progress Notes (Signed)
PROGRESS NOTE    Yolanda Watson  [id number] DOB: Apr 19, 1951 DOA: 04/11/2020 PCP: Patient, No Pcp Per   Brief Narrative: Yolanda Watson is a 69 y.o. 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 Patient presented secondary to abdominal pain and found to have an SBO.   Assessment & Plan:   Principal Problem:   SBO (small bowel obstruction) (HCC) Active Problems:   Abdominal pain   Nausea & vomiting   Acute hyponatremia   AKI (acute kidney injury) ([place])   Leukocytosis   Partial small bowel obstruction CT scan significant for SBO with transition point located in the midabdomen. History of multiple intraabdominal surgeries. NG tube placed and on intermittent suction. NG output of 1.8 L in the last 24 hours. -General surgery consult: non-operative management for now, NG tube decompression (clamping trial today), ambulate, dulcolax suppository  Hyponatremia Likely secondary to some mild hypovolemia. Resolved with IV fluids.  Hypokalemia -Replete as needed  AKI Baseline creatinine of 0.8. Creatinine of 1.68 on admission. BUN significantly elevated. Resolved. -Continue LR fluids  Leukocytosis In setting of SBO. Mildly improved but stable. No evidence of infection. -Trend CBC  Metastatic colon cancer Patient is currently on chemotherapy. Next cycle planned for about one week.  Pancreatic duct dilation Incidental finding on CT abdomen/pelvis. No mass lesion noted. Patient is currently asymptomatic. Lipase minimally elevated. Will need outpatient monitoring.  Abdominal wall thrombus On Xarelto as an outpatient. Held on admission -VTE prophylaxis with Lovenox   DVT prophylaxis: Lovenox Code Status:   Code Status: DNR Family Communication: None at bedside Disposition Plan: Discharge pending continued general surgery recommendations   Consultants:   General  surgery  Procedures:   NG tube (4/13>>  Antimicrobials:  None    Subjective: No abdominal pain. No nausea.  Objective: Vitals:   04/13/20 1433 04/13/20 2019 04/14/20 0500 04/14/20 0610  BP: 121/69 129/65  125/66  Pulse: 74 66  72  Resp: 17 16  16   Temp: 97.8 F (36.6 C) 97.7 F (36.5 C)  98.4 F (36.9 C)  TempSrc: Oral Oral  Oral  SpO2: 98% 97%  96%  Weight:   48.8 kg   Height:        Intake/Output Summary (Last 24 hours) at 04/14/2020 1038 Last data filed at 04/14/2020 0824 Gross per 24 hour  Intake 1595.39 ml  Output 1330 ml  Net 265.39 ml   Filed Weights   04/12/20 0536 04/13/20 0443 04/14/20 0500  Weight: 49 kg 49.6 kg 48.8 kg    Examination:  General exam: Appears calm and comfortable Respiratory system: Clear to auscultation. Respiratory effort normal. Cardiovascular system: S1 & S2 heard, RRR. No murmurs, rubs, gallops or clicks. Gastrointestinal system: Abdomen is distended today, soft and nontender. No organomegaly or masses felt. Normal bowel sounds heard. Central nervous system: Alert and oriented. No focal neurological deficits. Extremities: No edema. No calf tenderness Skin: No cyanosis. No rashes Psychiatry: Judgement and insight appear normal. Mood & affect appropriate.    Data Reviewed: I have personally reviewed following labs and imaging studies  CBC: Recent Labs  Lab 04/11/20 1542 04/12/20 0221 04/14/20 0431  WBC 20.8* 19.3* 18.6*  NEUTROABS 18.0* 15.5*  --   HGB 16.3* 14.7 12.6  HCT 46.2* 43.0 39.1  MCV 97.1 100.0 105.7*  PLT 211 250 [id number]   Basic Metabolic Panel: Recent Labs  Lab 04/11/20 1542 04/12/20 0221 04/13/20 0456 04/14/20 0431  NA  129* 133* 136 136  K 3.6 3.1* 3.5 3.2*  CL 81* 85* 86* 88*  CO2 28 32 35* 32  GLUCOSE 154* 124* 109* 82  BUN 87* 87* 55* 36*  CREATININE 1.68* 1.62* 1.17* 0.77  CALCIUM 8.8* 8.5* 9.0 8.6*  MG 2.1 2.2  --   --    GFR: Estimated Creatinine Clearance: 52.6 mL/min (by C-G formula  based on SCr of 0.77 mg/dL). Liver Function Tests: Recent Labs  Lab 04/11/20 1542 04/12/20 0221  AST 18 16  ALT 13 12  ALKPHOS 93 93  BILITOT 1.0 1.1  PROT 7.7 7.4  ALBUMIN 4.9 4.6   Recent Labs  Lab 04/11/20 1542  LIPASE 59*   No results for input(s): AMMONIA in the last 168 hours. Coagulation Profile: Recent Labs  Lab 04/12/20 0221  INR 0.9   Cardiac Enzymes: No results for input(s): CKTOTAL, CKMB, CKMBINDEX, TROPONINI in the last 168 hours. BNP (last 3 results) No results for input(s): PROBNP in the last 8760 hours. HbA1C: No results for input(s): HGBA1C in the last 72 hours. CBG: No results for input(s): GLUCAP in the last 168 hours. Lipid Profile: No results for input(s): CHOL, HDL, LDLCALC, TRIG, CHOLHDL, LDLDIRECT in the last 72 hours. Thyroid Function Tests: Recent Labs    04/12/20 0221  TSH 1.174   Anemia Panel: No results for input(s): VITAMINB12, FOLATE, FERRITIN, TIBC, IRON, RETICCTPCT in the last 72 hours. Sepsis Labs: No results for input(s): PROCALCITON, LATICACIDVEN in the last 168 hours.  Recent Results (from the past 240 hour(s))  Respiratory Panel by RT PCR (Flu A&B, Covid) - Nasopharyngeal Swab     Status: None   Collection Time: 04/11/20  6:49 PM   Specimen: Nasopharyngeal Swab  Result Value Ref Range Status   SARS Coronavirus 2 by RT PCR NEGATIVE NEGATIVE Final    Comment: (NOTE) SARS-CoV-2 target nucleic acids are NOT DETECTED. The SARS-CoV-2 RNA is generally detectable in upper respiratoy specimens during the acute phase of infection. The lowest concentration of SARS-CoV-2 viral copies this assay can detect is 131 copies/mL. A negative result does not preclude SARS-Cov-2 infection and should not be used as the sole basis for treatment or other patient management decisions. A negative result may occur with  improper specimen collection/handling, submission of specimen other than nasopharyngeal swab, presence of viral mutation(s)  within the areas targeted by this assay, and inadequate number of viral copies (<131 copies/mL). A negative result must be combined with clinical observations, patient history, and epidemiological information. The expected result is Negative. Fact Sheet for Patients:  PinkCheek.be Fact Sheet for Healthcare Providers:  GravelBags.it This test is not yet ap proved or cleared by the [place] FDA and  has been authorized for detection and/or diagnosis of SARS-CoV-2 by FDA under an Emergency Use Authorization (EUA). This EUA will remain  in effect (meaning this test can be used) for the duration of the COVID-19 declaration under Section 564(b)(1) of the Act, 21 U.S.C. section 360bbb-3(b)(1), unless the authorization is terminated or revoked sooner.    Influenza A by PCR NEGATIVE NEGATIVE Final   Influenza B by PCR NEGATIVE NEGATIVE Final    Comment: (NOTE) The Xpert Xpress SARS-CoV-2/FLU/RSV assay is intended as an aid in  the diagnosis of influenza from Nasopharyngeal swab specimens and  should not be used as a sole basis for treatment. Nasal washings and  aspirates are unacceptable for Xpert Xpress SARS-CoV-2/FLU/RSV  testing. Fact Sheet for Patients: PinkCheek.be Fact Sheet for Healthcare Providers: GravelBags.it  This test is not yet approved or cleared by the [place] and  has been authorized for detection and/or diagnosis of SARS-CoV-2 by  FDA under an Emergency Use Authorization (EUA). This EUA will remain  in effect (meaning this test can be used) for the duration of the  Covid-19 declaration under Section 564(b)(1) of the Act, 21  U.S.C. section 360bbb-3(b)(1), unless the authorization is  terminated or revoked. Performed at [facility], [place] [address], [place], [place] [address]          Radiology Studies: DG Abd 1  View  Result Date: 04/13/2020 CLINICAL DATA:  Small bowel obstruction EXAM: ABDOMEN - 1 VIEW COMPARISON:  04/12/2020 FINDINGS: Dilated small bowel loops noted compatible with small bowel obstruction. Oral contrast material appears to now have entered the right colon compatible with partial small bowel obstruction, but likely high-grade. NG tube is in the stomach. No free air organomegaly. IMPRESSION: Continued partial small bowel obstruction pattern, likely high-grade. Electronically Signed   By: Rolm Baptise M.D.   On: 04/13/2020 07:53   DG Abd Portable 1V  Result Date: 04/14/2020 CLINICAL DATA:  69 year old female with history of small-bowel obstruction. EXAM: PORTABLE ABDOMEN - 1 VIEW COMPARISON:  Chest x-ray 04/13/2020. FINDINGS: Nasogastric tube coiled in the stomach. Small amount of oral contrast material in the proximal stomach. Gas and stool noted throughout the colon including the distal rectum. Persistent dilatation of small-bowel loops in the left upper quadrant measuring up to 4 cm in diameter. Overall, the degree of small-bowel dilatation has improved compared to the prior examination. IMPRESSION: 1. Findings are compatible with residual but improving partial small bowel obstruction, as above. 2. Nasogastric tube coiled in the stomach. Electronically Signed   By: Vinnie Langton M.D.   On: 04/14/2020 07:46   DG Abd Portable 1V-Small Bowel Obstruction Protocol-initial, 8 hr delay  Result Date: 04/12/2020 CLINICAL DATA:  Small-bowel obstruction EXAM: PORTABLE ABDOMEN - 1 VIEW COMPARISON:  04/11/2020 FINDINGS: Supine frontal view of the abdomen and pelvis was performed approximately 8 hours after administration of contrast via indwelling enteric catheter. Enteric catheter again seen within the gastric fundus. Contrast is seen outlining the stomach and multiple dilated loops of small bowel. There is no colonic contrast seen on this exam. Overall, caliber of the small bowel is not appreciably  changed since prior studies. IMPRESSION: 1. Continued dilated loops of small bowel consistent with small-bowel obstruction. 2. No evidence of oral contrast within the colon. Electronically Signed   By: Randa Ngo M.D.   On: 04/12/2020 21:27        Scheduled Meds: . Chlorhexidine Gluconate Cloth  6 each Topical Daily  . enoxaparin (LOVENOX) injection  40 mg Subcutaneous Q24H  . sodium chloride flush  3 mL Intravenous Q12H   Continuous Infusions: . lactated ringers 75 mL/hr at 04/14/20 0200  . potassium chloride 10 mEq (04/14/20 1035)     LOS: 3 days     Cordelia Poche, MD [facility] 04/14/2020, 10:38 AM  If 7PM-7AM, please contact night-coverage [email or web address]

## 2020-04-14 NOTE — Care Management Important Message (Signed)
Important Message  Patient Details IM Letter given to [place] Case Manager to present to the Patient Name: ZHARA WANGER MRN: [id number] Date of Birth: 01/18/1951   Medicare Important Message Given:  Yes     Kerin Salen 04/14/2020, 11:12 AM

## 2020-04-14 NOTE — Plan of Care (Signed)

## 2020-04-14 NOTE — Progress Notes (Signed)
Pt refused to ambulate in the hallway x4. Attempted multiple times, education provided. Will keep encouraging. Notified MD.

## 2020-04-14 NOTE — Progress Notes (Signed)
[facility] Progress Note     Subjective: CC-  Still no flatus or BM, but patient states that she feels a lot of rumbling in her abdomen. Minimal out of NG tube yesterday (350cc). Denies abdominal pain, nausea, vomiting. Xray today shows gas and stool throughout the colon/rectum, dilated loops of small-bowel are less.  Objective: Vital signs in last 24 hours: Temp:  [97.7 F (36.5 C)-98.4 F (36.9 C)] 98.4 F (36.9 C) (04/16 0610) Pulse Rate:  [66-74] 72 (04/16 0610) Resp:  [16-17] 16 (04/16 0610) BP: (121-129)/(65-69) 125/66 (04/16 0610) SpO2:  [96 %-98 %] 96 % (04/16 0610) Weight:  [48.8 kg] 48.8 kg (04/16 0500) Last BM Date: 04/06/20  Intake/Output from previous day: 04/15 0701 - 04/16 0700 In: 1820.4 [P.O.:180; I.V.:1640.4] Out: 1330 [Urine:980; Emesis/NG output:350] Intake/Output this shift: Total I/O In: -  Out: 200 [Emesis/NG output:200]  PE: Gen:  Alert, NAD, pleasant HEENT: EOM's intact, pupils equal and round Pulm:  rate and effort normal Abd: Soft, mild distension, nontender, +BS Ext:  no BUE/BLE edema, calves soft and nontender  Lab Results:  Recent Labs    04/12/20 0221 04/14/20 0431  WBC 19.3* 18.6*  HGB 14.7 12.6  HCT 43.0 39.1  PLT 250 219   BMET Recent Labs    04/13/20 0456 04/14/20 0431  NA 136 136  K 3.5 3.2*  CL 86* 88*  CO2 35* 32  GLUCOSE 109* 82  BUN 55* 36*  CREATININE 1.17* 0.77  CALCIUM 9.0 8.6*   PT/INR Recent Labs    04/12/20 0221  LABPROT 11.9  INR 0.9   CMP     Component Value Date/Time   NA 136 04/14/2020 0431   NA 141 01/15/2018 0000   NA 138 08/07/2017 0835   K 3.2 (L) 04/14/2020 0431   K 4.1 08/07/2017 0835   CL 88 (L) 04/14/2020 0431   CO2 32 04/14/2020 0431   CO2 25 08/07/2017 0835   GLUCOSE 82 04/14/2020 0431   GLUCOSE 95 08/07/2017 0835   BUN 36 (H) 04/14/2020 0431   BUN 4 01/15/2018 0000   BUN 9.3 08/07/2017 0835   CREATININE 0.77 04/14/2020 0431   CREATININE 0.80 03/16/2020 1002    CREATININE 0.57 02/05/2018 1144   CREATININE 0.7 08/07/2017 0835   CALCIUM 8.6 (L) 04/14/2020 0431   CALCIUM 9.4 08/07/2017 0835   PROT 7.4 04/12/2020 0221   PROT 7.3 08/07/2017 0835   ALBUMIN 4.6 04/12/2020 0221   ALBUMIN 3.4 (L) 08/07/2017 0835   AST 16 04/12/2020 0221   AST 13 (L) 03/16/2020 1002   AST 25 08/07/2017 0835   ALT 12 04/12/2020 0221   ALT 11 03/16/2020 1002   ALT 21 08/07/2017 0835   ALKPHOS 93 04/12/2020 0221   ALKPHOS 138 08/07/2017 0835   BILITOT 1.1 04/12/2020 0221   BILITOT 0.4 03/16/2020 1002   BILITOT 0.49 08/07/2017 0835   GFRNONAA >60 04/14/2020 0431   GFRNONAA >60 03/16/2020 1002   GFRAA >60 04/14/2020 0431   GFRAA >60 03/16/2020 1002   Lipase     Component Value Date/Time   LIPASE 59 (H) 04/11/2020 1542       Studies/Results: DG Abd 1 View  Result Date: 04/13/2020 CLINICAL DATA:  Small bowel obstruction EXAM: ABDOMEN - 1 VIEW COMPARISON:  04/12/2020 FINDINGS: Dilated small bowel loops noted compatible with small bowel obstruction. Oral contrast material appears to now have entered the right colon compatible with partial small bowel obstruction, but likely high-grade. NG tube is in  the stomach. No free air organomegaly. IMPRESSION: Continued partial small bowel obstruction pattern, likely high-grade. Electronically Signed   By: Rolm Baptise M.D.   On: 04/13/2020 07:53   DG Abd Portable 1V  Result Date: 04/14/2020 CLINICAL DATA:  69 year old female with history of small-bowel obstruction. EXAM: PORTABLE ABDOMEN - 1 VIEW COMPARISON:  Chest x-ray 04/13/2020. FINDINGS: Nasogastric tube coiled in the stomach. Small amount of oral contrast material in the proximal stomach. Gas and stool noted throughout the colon including the distal rectum. Persistent dilatation of small-bowel loops in the left upper quadrant measuring up to 4 cm in diameter. Overall, the degree of small-bowel dilatation has improved compared to the prior examination. IMPRESSION: 1.  Findings are compatible with residual but improving partial small bowel obstruction, as above. 2. Nasogastric tube coiled in the stomach. Electronically Signed   By: Vinnie Langton M.D.   On: 04/14/2020 07:46   DG Abd Portable 1V-Small Bowel Obstruction Protocol-initial, 8 hr delay  Result Date: 04/12/2020 CLINICAL DATA:  Small-bowel obstruction EXAM: PORTABLE ABDOMEN - 1 VIEW COMPARISON:  04/11/2020 FINDINGS: Supine frontal view of the abdomen and pelvis was performed approximately 8 hours after administration of contrast via indwelling enteric catheter. Enteric catheter again seen within the gastric fundus. Contrast is seen outlining the stomach and multiple dilated loops of small bowel. There is no colonic contrast seen on this exam. Overall, caliber of the small bowel is not appreciably changed since prior studies. IMPRESSION: 1. Continued dilated loops of small bowel consistent with small-bowel obstruction. 2. No evidence of oral contrast within the colon. Electronically Signed   By: Randa Ngo M.D.   On: 04/12/2020 21:27    Anti-infectives: Anti-infectives (From admission, onward)   None       Assessment/Plan Chronic tobacco use Abdominal wall thrombus-on Xarelto Hx EtOH abuse AKI Malnutrition - prealbumin 14.7 (4/16)  Metastatic colon cancer with right hemicolectomy 2016 - ongoing chemotherapy, followed by Dr. Benay Spice -Excision of malignant mass from the abdominal including skin, muscle, fascia and peritoneum(15 x 12cm), abdominal reconstruction with [name], 10/03/17 Dr. Barry Dienes and [name]  SBO - CT 4/13 showed Small bowel obstruction with transition point in the mid abdomen - started SB protocol 4/14 - xray today shows gas and stool throughout the colon/rectum, dilated loops of small-bowel are less  FEN: IVF, clamp NGT, sips of clear liquids [id number] DVT: SCD's, lovenox Foley: none Follow up: TBD  Plan: No return in bowel function but xray improved  and NG tube output significantly down. Will attempt clamping trial and let patient sip on clear liquids. If she becomes nauseated or has increased abdominal pain return NG tube to LIWS. Mobilize. Dulcolax suppository for stool in colon.   LOS: 3 days    [facility] 04/14/2020, 10:07 AM Please see Amion for pager number during day hours 7:00am-4:30pm

## 2020-04-14 NOTE — Progress Notes (Signed)
NGT was clamped at 1000. Pt requesting to start the suction. NG suction started at 1130. PA is notified.

## 2020-04-14 NOTE — Progress Notes (Signed)
Pharmacist Chemotherapy Monitoring - Follow Up Assessment    I verify that I have reviewed each item in the below checklist:  . Regimen for the patient is scheduled for the appropriate day and plan matches scheduled date. Marland Kitchen Appropriate non-routine labs are ordered dependent on drug ordered. . If applicable, additional medications reviewed and ordered per protocol based on lifetime cumulative doses and/or treatment regimen.   Plan for follow-up and/or issues identified: No . I-vent associated with next due treatment: No . MD and/or nursing notified: No  Philomena Course 04/14/2020 10:43 AM

## 2020-04-15 ENCOUNTER — Inpatient Hospital Stay (HOSPITAL_COMMUNITY): Payer: Medicare Other

## 2020-04-15 DIAGNOSIS — E871 Hypo-osmolality and hyponatremia: Secondary | ICD-10-CM | POA: Diagnosis not present

## 2020-04-15 DIAGNOSIS — K56609 Unspecified intestinal obstruction, unspecified as to partial versus complete obstruction: Secondary | ICD-10-CM | POA: Diagnosis not present

## 2020-04-15 DIAGNOSIS — R112 Nausea with vomiting, unspecified: Secondary | ICD-10-CM | POA: Diagnosis not present

## 2020-04-15 DIAGNOSIS — N179 Acute kidney failure, unspecified: Secondary | ICD-10-CM | POA: Diagnosis not present

## 2020-04-15 LAB — BASIC METABOLIC PANEL
Anion gap: 16 — ABNORMAL HIGH (ref 5–15)
BUN: 25 mg/dL — ABNORMAL HIGH (ref 8–23)
CO2: 31 mmol/L (ref 22–32)
Calcium: 8.5 mg/dL — ABNORMAL LOW (ref 8.9–10.3)
Chloride: 91 mmol/L — ABNORMAL LOW (ref 98–111)
Creatinine, Ser: 0.85 mg/dL (ref 0.44–1.00)
GFR calc Af Amer: >60 mL/min (ref >60)
GFR calc non Af Amer: >60 mL/min (ref >60)
Glucose, Bld: 92 mg/dL (ref 70–99)
Potassium: 3.6 mmol/L (ref 3.5–5.1)
Sodium: 138 mmol/L (ref 135–145)

## 2020-04-15 LAB — CBC
HCT: 40.3 % (ref 36.0–46.0)
Hemoglobin: 13.1 g/dL (ref 12.0–15.0)
MCH: 34.1 pg — ABNORMAL HIGH (ref 26.0–34.0)
MCHC: 32.5 g/dL (ref 30.0–36.0)
MCV: 104.9 fL — ABNORMAL HIGH (ref 80.0–100.0)
Platelets: 252 10*3/uL (ref 150–400)
RBC: 3.84 MIL/uL — ABNORMAL LOW (ref 3.87–5.11)
RDW: 12.8 % (ref 11.5–15.5)
WBC: 18.7 10*3/uL — ABNORMAL HIGH (ref 4.0–10.5)
nRBC: 0 % (ref 0.0–0.2)

## 2020-04-15 NOTE — Progress Notes (Signed)
   Subjective/Chief Complaint: Passed a little flatus this morning Still with high NG output   Objective: Vital signs in last 24 hours: Temp:  [97.6 F (36.4 C)-98.3 F (36.8 C)] 97.6 F (36.4 C) (04/17 [id number]) Pulse Rate:  [68-70] 68 (04/17 0608) Resp:  [14-16] 14 (04/17 0608) BP: (125-127)/(61-66) 125/66 (04/17 0608) SpO2:  [93 %] 93 % (04/17 [id number]) Weight:  [50.1 kg] 50.1 kg (04/17 0608) Last BM Date: 04/06/20  Intake/Output from previous day: 04/16 0701 - 04/17 0700 In: 2317.6 [P.O.:170; I.V.:1747.6; IV Piggyback:400] Out: [id number] [Urine:200; Emesis/NG [id number] Intake/Output this shift: No intake/output data recorded.  Exam: Awake and alert, looks comfortable Abdomen soft, non-tender this morning, no peritoneal signs  Lab Results:  Recent Labs    04/14/20 0431 04/15/20 0303  WBC 18.6* 18.7*  HGB 12.6 13.1  HCT 39.1 40.3  PLT 219 252   BMET Recent Labs    04/14/20 0431 04/15/20 0303  NA 136 138  K 3.2* 3.6  CL 88* 91*  CO2 32 31  GLUCOSE 82 92  BUN 36* 25*  CREATININE 0.77 0.85  CALCIUM 8.6* 8.5*   PT/INR No results for input(s): LABPROT, INR in the last 72 hours. ABG No results for input(s): PHART, HCO3 in the last 72 hours.  Invalid input(s): PCO2, PO2  Studies/Results: DG Abd Portable 1V  Result Date: 04/15/2020 CLINICAL DATA:  Small-bowel obstruction. EXAM: PORTABLE ABDOMEN - 1 VIEW COMPARISON:  1 day prior FINDINGS: Single supine view of the abdomen and pelvis. The upper abdomen is excluded. Nasogastric tube is looped in the stomach. Contrast identified within the colon. Small bowel dilatation is relatively similar, including at 3.6 cm. Distal gas identified. No gross free intraperitoneal air. IMPRESSION: Similar partial small bowel obstruction. Nasogastric tube incompletely imaged but looped in the stomach. Electronically Signed   By: Abigail Miyamoto M.D.   On: 04/15/2020 06:51   DG Abd Portable 1V  Result Date: 04/14/2020 CLINICAL DATA:   69 year old female with history of small-bowel obstruction. EXAM: PORTABLE ABDOMEN - 1 VIEW COMPARISON:  Chest x-ray 04/13/2020. FINDINGS: Nasogastric tube coiled in the stomach. Small amount of oral contrast material in the proximal stomach. Gas and stool noted throughout the colon including the distal rectum. Persistent dilatation of small-bowel loops in the left upper quadrant measuring up to 4 cm in diameter. Overall, the degree of small-bowel dilatation has improved compared to the prior examination. IMPRESSION: 1. Findings are compatible with residual but improving partial small bowel obstruction, as above. 2. Nasogastric tube coiled in the stomach. Electronically Signed   By: Vinnie Langton M.D.   On: 04/14/2020 07:46    Anti-infectives: Anti-infectives (From admission, onward)   None      Assessment/Plan: Chronic tobacco use Abdominal wall thrombus-on Xarelto Hx EtOH abuse AKI Malnutrition - prealbumin 14.7 (4/16)  Metastatic colon cancerwith right hemicolectomy 2016 - ongoing chemotherapy, followed by Dr. Benay Spice -Excision of malignant mass from the abdominal including skin, muscle, fascia and peritoneum(15 x 12cm), abdominal reconstruction with [name], 10/03/17 Dr. Barry Dienes and [name]  SBO Still with high NG output. Definitely not an acute abdomen.  High risk for surgery from the standpoint of risks of enterotomy, fistula, difficult closure of the abdominal wall, etc so will go very slowly  Continue NG to suction   LOS: 4 days    Coralie Keens MD 04/15/2020

## 2020-04-15 NOTE — Plan of Care (Signed)

## 2020-04-15 NOTE — Progress Notes (Signed)
PROGRESS NOTE    Yolanda Watson  [id number] DOB: 07-21-1951 DOA: 04/11/2020 PCP: Patient, No Pcp Per   Brief Narrative: Yolanda Watson is a 69 y.o. 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 Patient presented secondary to abdominal pain and found to have an SBO.   Assessment & Plan:   Principal Problem:   SBO (small bowel obstruction) (HCC) Active Problems:   Abdominal pain   Nausea & vomiting   Acute hyponatremia   AKI (acute kidney injury) ([place])   Leukocytosis   Partial small bowel obstruction CT scan significant for SBO with transition point located in the midabdomen. History of multiple intraabdominal surgeries. NG tube placed and on intermittent suction. NG output of 1.38 L in the last 24 hours. -General surgery consult: non-operative management for now, NG tube decompression, ambulate -Will need to consider TPN if SBO persists  Hyponatremia Likely secondary to some mild hypovolemia. Resolved with IV fluids.  Hypokalemia -Replete as needed  AKI Baseline creatinine of 0.8. Creatinine of 1.68 on admission. BUN significantly elevated. Resolved. -Continue LR fluids  Leukocytosis In setting of SBO. Mildly improved initially and now stablized. No evidence of infection. -Trend CBC  Metastatic colon cancer Patient is currently on chemotherapy. Next cycle planned for about one week.  Pancreatic duct dilation Incidental finding on CT abdomen/pelvis. No mass lesion noted. Patient is currently asymptomatic. Lipase minimally elevated. Will need outpatient monitoring.  Abdominal wall thrombus On Xarelto as an outpatient. Held on admission -VTE prophylaxis with Lovenox   DVT prophylaxis: Lovenox Code Status:   Code Status: DNR Family Communication: None at bedside Disposition Plan: Discharge pending continued general surgery recommendations   Consultants:    General surgery  Procedures:   NG tube (4/13>>  Antimicrobials:  None    Subjective: Some flatus. No bowel movement. Failed clamping trial on 4/16  Objective: Vitals:   04/14/20 0500 04/14/20 0610 04/14/20 2049 04/15/20 0608  BP:  125/66 127/61 125/66  Pulse:  72 70 68  Resp:  16 16 14   Temp:  98.4 F (36.9 C) 98.3 F (36.8 C) 97.6 F (36.4 C)  TempSrc:  Oral Oral Oral  SpO2:  96% 93% 93%  Weight: 48.8 kg   50.1 kg  Height:        Intake/Output Summary (Last 24 hours) at 04/15/2020 0933 Last data filed at 04/15/2020 0858 Gross per 24 hour  Intake 2437.55 ml  Output 1375 ml  Net 1062.55 ml   Filed Weights   04/13/20 0443 04/14/20 0500 04/15/20 0608  Weight: 49.6 kg 48.8 kg 50.1 kg    Examination:  General exam: Appears calm and comfortable. NG tube in place Respiratory system: Clear to auscultation. Respiratory effort normal. Cardiovascular system: S1 & S2 heard, RRR. No murmurs, rubs, gallops or clicks. Gastrointestinal system: Abdomen is slightly distended, soft and nontender. No organomegaly or masses felt. Improved bowel sounds heard. Central nervous system: Alert and oriented. No focal neurological deficits. Extremities: No edema. No calf tenderness Skin: No cyanosis. No rashes Psychiatry: Judgement and insight appear normal. Mood & affect appropriate.     Data Reviewed: I have personally reviewed following labs and imaging studies  CBC: Recent Labs  Lab 04/11/20 1542 04/12/20 0221 04/14/20 0431 04/15/20 0303  WBC 20.8* 19.3* 18.6* 18.7*  NEUTROABS 18.0* 15.5*  --   --   HGB 16.3* 14.7 12.6 13.1  HCT 46.2* 43.0 39.1 40.3  MCV 97.1 100.0 105.7*  104.9*  PLT 211 250 219 [id number]   Basic Metabolic Panel: Recent Labs  Lab 04/11/20 1542 04/12/20 0221 04/13/20 0456 04/14/20 0431 04/15/20 0303  NA 129* 133* 136 136 138  K 3.6 3.1* 3.5 3.2* 3.6  CL 81* 85* 86* 88* 91*  CO2 28 32 35* 32 31  GLUCOSE 154* 124* 109* 82 92  BUN 87* 87* 55* 36*  25*  CREATININE 1.68* 1.62* 1.17* 0.77 0.85  CALCIUM 8.8* 8.5* 9.0 8.6* 8.5*  MG 2.1 2.2  --   --   --    GFR: Estimated Creatinine Clearance: 50.8 mL/min (by C-G formula based on SCr of 0.85 mg/dL). Liver Function Tests: Recent Labs  Lab 04/11/20 1542 04/12/20 0221  AST 18 16  ALT 13 12  ALKPHOS 93 93  BILITOT 1.0 1.1  PROT 7.7 7.4  ALBUMIN 4.9 4.6   Recent Labs  Lab 04/11/20 1542  LIPASE 59*   No results for input(s): AMMONIA in the last 168 hours. Coagulation Profile: Recent Labs  Lab 04/12/20 0221  INR 0.9   Cardiac Enzymes: No results for input(s): CKTOTAL, CKMB, CKMBINDEX, TROPONINI in the last 168 hours. BNP (last 3 results) No results for input(s): PROBNP in the last 8760 hours. HbA1C: No results for input(s): HGBA1C in the last 72 hours. CBG: No results for input(s): GLUCAP in the last 168 hours. Lipid Profile: No results for input(s): CHOL, HDL, LDLCALC, TRIG, CHOLHDL, LDLDIRECT in the last 72 hours. Thyroid Function Tests: No results for input(s): TSH, T4TOTAL, FREET4, T3FREE, THYROIDAB in the last 72 hours. Anemia Panel: No results for input(s): VITAMINB12, FOLATE, FERRITIN, TIBC, IRON, RETICCTPCT in the last 72 hours. Sepsis Labs: No results for input(s): PROCALCITON, LATICACIDVEN in the last 168 hours.  Recent Results (from the past 240 hour(s))  Respiratory Panel by RT PCR (Flu A&B, Covid) - Nasopharyngeal Swab     Status: None   Collection Time: 04/11/20  6:49 PM   Specimen: Nasopharyngeal Swab  Result Value Ref Range Status   SARS Coronavirus 2 by RT PCR NEGATIVE NEGATIVE Final    Comment: (NOTE) SARS-CoV-2 target nucleic acids are NOT DETECTED. The SARS-CoV-2 RNA is generally detectable in upper respiratoy specimens during the acute phase of infection. The lowest concentration of SARS-CoV-2 viral copies this assay can detect is 131 copies/mL. A negative result does not preclude SARS-Cov-2 infection and should not be used as the sole basis  for treatment or other patient management decisions. A negative result may occur with  improper specimen collection/handling, submission of specimen other than nasopharyngeal swab, presence of viral mutation(s) within the areas targeted by this assay, and inadequate number of viral copies (<131 copies/mL). A negative result must be combined with clinical observations, patient history, and epidemiological information. The expected result is Negative. Fact Sheet for Patients:  PinkCheek.be Fact Sheet for Healthcare Providers:  GravelBags.it This test is not yet ap proved or cleared by the [place] FDA and  has been authorized for detection and/or diagnosis of SARS-CoV-2 by FDA under an Emergency Use Authorization (EUA). This EUA will remain  in effect (meaning this test can be used) for the duration of the COVID-19 declaration under Section 564(b)(1) of the Act, 21 U.S.C. section 360bbb-3(b)(1), unless the authorization is terminated or revoked sooner.    Influenza A by PCR NEGATIVE NEGATIVE Final   Influenza B by PCR NEGATIVE NEGATIVE Final    Comment: (NOTE) The Xpert Xpress SARS-CoV-2/FLU/RSV assay is intended as an aid in  the diagnosis of  influenza from Nasopharyngeal swab specimens and  should not be used as a sole basis for treatment. Nasal washings and  aspirates are unacceptable for Xpert Xpress SARS-CoV-2/FLU/RSV  testing. Fact Sheet for Patients: PinkCheek.be Fact Sheet for Healthcare Providers: GravelBags.it This test is not yet approved or cleared by the [place] FDA and  has been authorized for detection and/or diagnosis of SARS-CoV-2 by  FDA under an Emergency Use Authorization (EUA). This EUA will remain  in effect (meaning this test can be used) for the duration of the  Covid-19 declaration under Section 564(b)(1) of the Act, 21  U.S.C.  section 360bbb-3(b)(1), unless the authorization is  terminated or revoked. Performed at [facility], [place] [address] [place], [place] [address]          Radiology Studies: DG Abd Portable 1V  Result Date: 04/15/2020 CLINICAL DATA:  Small-bowel obstruction. EXAM: PORTABLE ABDOMEN - 1 VIEW COMPARISON:  1 day prior FINDINGS: Single supine view of the abdomen and pelvis. The upper abdomen is excluded. Nasogastric tube is looped in the stomach. Contrast identified within the colon. Small bowel dilatation is relatively similar, including at 3.6 cm. Distal gas identified. No gross free intraperitoneal air. IMPRESSION: Similar partial small bowel obstruction. Nasogastric tube incompletely imaged but looped in the stomach. Electronically Signed   By: Abigail Miyamoto M.D.   On: 04/15/2020 06:51   DG Abd Portable 1V  Result Date: 04/14/2020 CLINICAL DATA:  69 year old female with history of small-bowel obstruction. EXAM: PORTABLE ABDOMEN - 1 VIEW COMPARISON:  Chest x-ray 04/13/2020. FINDINGS: Nasogastric tube coiled in the stomach. Small amount of oral contrast material in the proximal stomach. Gas and stool noted throughout the colon including the distal rectum. Persistent dilatation of small-bowel loops in the left upper quadrant measuring up to 4 cm in diameter. Overall, the degree of small-bowel dilatation has improved compared to the prior examination. IMPRESSION: 1. Findings are compatible with residual but improving partial small bowel obstruction, as above. 2. Nasogastric tube coiled in the stomach. Electronically Signed   By: Vinnie Langton M.D.   On: 04/14/2020 07:46        Scheduled Meds: . Chlorhexidine Gluconate Cloth  6 each Topical Daily  . enoxaparin (LOVENOX) injection  40 mg Subcutaneous Q24H  . sodium chloride flush  3 mL Intravenous Q12H   Continuous Infusions: . lactated ringers 75 mL/hr at 04/15/20 0120     LOS: 4 days     Cordelia Poche, MD [facility] 04/15/2020, 9:33 AM  If 7PM-7AM, please contact night-coverage [email or web address]

## 2020-04-16 DIAGNOSIS — N179 Acute kidney failure, unspecified: Secondary | ICD-10-CM | POA: Diagnosis not present

## 2020-04-16 DIAGNOSIS — K56609 Unspecified intestinal obstruction, unspecified as to partial versus complete obstruction: Secondary | ICD-10-CM | POA: Diagnosis not present

## 2020-04-16 DIAGNOSIS — E871 Hypo-osmolality and hyponatremia: Secondary | ICD-10-CM | POA: Diagnosis not present

## 2020-04-16 DIAGNOSIS — R112 Nausea with vomiting, unspecified: Secondary | ICD-10-CM | POA: Diagnosis not present

## 2020-04-16 LAB — CBC
HCT: 37.9 % (ref 36.0–46.0)
Hemoglobin: 12.2 g/dL (ref 12.0–15.0)
MCH: 34.1 pg — ABNORMAL HIGH (ref 26.0–34.0)
MCHC: 32.2 g/dL (ref 30.0–36.0)
MCV: 105.9 fL — ABNORMAL HIGH (ref 80.0–100.0)
Platelets: 242 10*3/uL (ref 150–400)
RBC: 3.58 MIL/uL — ABNORMAL LOW (ref 3.87–5.11)
RDW: 12.7 % (ref 11.5–15.5)
WBC: 14.9 10*3/uL — ABNORMAL HIGH (ref 4.0–10.5)
nRBC: 0 % (ref 0.0–0.2)

## 2020-04-16 LAB — BASIC METABOLIC PANEL
Anion gap: 17 — ABNORMAL HIGH (ref 5–15)
BUN: 18 mg/dL (ref 8–23)
CO2: 33 mmol/L — ABNORMAL HIGH (ref 22–32)
Calcium: 8.1 mg/dL — ABNORMAL LOW (ref 8.9–10.3)
Chloride: 89 mmol/L — ABNORMAL LOW (ref 98–111)
Creatinine, Ser: 0.76 mg/dL (ref 0.44–1.00)
GFR calc Af Amer: >60 mL/min (ref >60)
GFR calc non Af Amer: >60 mL/min (ref >60)
Glucose, Bld: 85 mg/dL (ref 70–99)
Potassium: 3 mmol/L — ABNORMAL LOW (ref 3.5–5.1)
Sodium: 139 mmol/L (ref 135–145)

## 2020-04-16 LAB — MAGNESIUM: Magnesium: 1.8 mg/dL (ref 1.7–2.4)

## 2020-04-16 MED ORDER — POTASSIUM CHLORIDE 10 MEQ/100ML IV SOLN
10.0000 meq | INTRAVENOUS | Status: AC
  Administered 2020-04-16 (×5): 10 meq via INTRAVENOUS
  Filled 2020-04-16 (×5): qty 100

## 2020-04-16 NOTE — Plan of Care (Signed)
  Problem: Activity: Goal: Risk for activity intolerance will decrease Outcome: Progressing   Problem: Nutrition: Goal: Adequate nutrition will be maintained Outcome: Progressing   Problem: Clinical Measurements: Goal: Respiratory complications will improve Outcome: Progressing   Problem: Clinical Measurements: Goal: Cardiovascular complication will be avoided Outcome: Progressing   Problem: Nutrition: Goal: Adequate nutrition will be maintained Outcome: Progressing   Problem: Pain Managment: Goal: General experience of comfort will improve Outcome: Progressing   Problem: Safety: Goal: Ability to remain free from injury will improve Outcome: Progressing

## 2020-04-16 NOTE — Progress Notes (Signed)
   Subjective/Chief Complaint: Still with high NG output but taking in a moderate amount of ice Passing more flatus   Objective: Vital signs in last 24 hours: Temp:  [98 F (36.7 C)-98.8 F (37.1 C)] 98.4 F (36.9 C) (04/18 0434) Pulse Rate:  [68-78] 78 (04/18 0434) Resp:  [16] 16 (04/18 0434) BP: (116-132)/(57-70) 116/57 (04/18 0434) SpO2:  [92 %-96 %] 92 % (04/18 0434) Weight:  [49.5 kg] 49.5 kg (04/18 0436) Last BM Date: 04/13/20  Intake/Output from previous day: 04/17 0701 - 04/18 0700 In: 2047.9 [P.O.:120; I.V.:1807.9] Out: 2100 [Urine:350; Emesis/NG output:1750] Intake/Output this shift: No intake/output data recorded.  Exam: Looks comfortable Awake and alert Abdomen soft but still with mild tenderness  Lab Results:  Recent Labs    04/15/20 0303 04/16/20 0138  WBC 18.7* 14.9*  HGB 13.1 12.2  HCT 40.3 37.9  PLT 252 242   BMET Recent Labs    04/15/20 0303 04/16/20 0138  NA 138 139  K 3.6 3.0*  CL 91* 89*  CO2 31 33*  GLUCOSE 92 85  BUN 25* 18  CREATININE 0.85 0.76  CALCIUM 8.5* 8.1*   PT/INR No results for input(s): LABPROT, INR in the last 72 hours. ABG No results for input(s): PHART, HCO3 in the last 72 hours.  Invalid input(s): PCO2, PO2  Studies/Results: DG Abd Portable 1V  Result Date: 04/15/2020 CLINICAL DATA:  Small-bowel obstruction. EXAM: PORTABLE ABDOMEN - 1 VIEW COMPARISON:  1 day prior FINDINGS: Single supine view of the abdomen and pelvis. The upper abdomen is excluded. Nasogastric tube is looped in the stomach. Contrast identified within the colon. Small bowel dilatation is relatively similar, including at 3.6 cm. Distal gas identified. No gross free intraperitoneal air. IMPRESSION: Similar partial small bowel obstruction. Nasogastric tube incompletely imaged but looped in the stomach. Electronically Signed   By: Abigail Miyamoto M.D.   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 Benay Spice -Excision of malignant mass from the abdominal including skin, muscle, fascia and peritoneum(15 x 12cm), abdominal reconstruction with [name], 10/03/17 Dr. Barry Dienes and [name]  SBO Ng output still high but may be partly ice.  Films yesterday still with dilated small bowel Will continue NG to suction for at least one more day.  It is encouraging that she is passing more flatus Will repeat films in the morning  LOS: 5 days    Coralie Keens 04/16/2020

## 2020-04-16 NOTE — Plan of Care (Signed)
Plan of care reviewed and discussed with the patient. 

## 2020-04-16 NOTE — Progress Notes (Signed)
Dr. Lonny Prude and Dr. Marcello Moores paged and notified of patient increasing lower abdominal gas pain. No changes in bowel sounds times four but belly remains distended. Ng tube is working without complication. Rn will continue to monitor.

## 2020-04-16 NOTE — Progress Notes (Signed)
PROGRESS NOTE    Yolanda Watson  [id number] DOB: 1951-06-22 DOA: 04/11/2020 PCP: Patient, No Pcp Per   Brief Narrative: Yolanda Watson is a 69 y.o. 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 Patient presented secondary to abdominal pain and found to have an SBO.   Assessment & Plan:   Principal Problem:   SBO (small bowel obstruction) (HCC) Active Problems:   Abdominal pain   Nausea & vomiting   Acute hyponatremia   AKI (acute kidney injury) ([place])   Leukocytosis   Partial small bowel obstruction CT scan significant for SBO with transition point located in the midabdomen. History of multiple intraabdominal surgeries. NG tube placed and on intermittent suction. NG output of 1.38 L in the last 24 hours. -General surgery consult: non-operative management for now, NG tube decompression, ambulate -Will need to consider TPN if SBO persists; will make decision on 4/19  Hyponatremia Likely secondary to some mild hypovolemia. Resolved with IV fluids.  Hypokalemia -Replete as needed  AKI Baseline creatinine of 0.8. Creatinine of 1.68 on admission. BUN significantly elevated and now improved. Resolved. -Continue LR fluids  Metabolic alkalosis Secondary to GI losses. Associated low chloride and elevated anion gap.  Leukocytosis In setting of SBO. Starting to improved. -Trend CBC  Metastatic colon cancer Patient is currently on chemotherapy. Next cycle planned for about one week.  Pancreatic duct dilation Incidental finding on CT abdomen/pelvis. No mass lesion noted. Patient is currently asymptomatic. Lipase minimally elevated. Will need outpatient monitoring.  Abdominal wall thrombus On Xarelto as an outpatient. Held on admission -VTE prophylaxis with Lovenox   DVT prophylaxis: Lovenox Code Status:   Code Status: DNR Family Communication: None at bedside Disposition  Plan: Discharge pending continued general surgery recommendations   Consultants:   General surgery  Procedures:   NG tube (4/13>>  Antimicrobials:  None    Subjective: Passing flatus. Abdominal discomfort  Objective: Vitals:   04/15/20 1844 04/15/20 2052 04/16/20 0434 04/16/20 0436  BP: 121/67 120/67 (!) 116/57   Pulse: 78 76 78   Resp: 16 16 16    Temp: 98.8 F (37.1 C) 98 F (36.7 C) 98.4 F (36.9 C)   TempSrc: Oral Oral Oral   SpO2:  92% 92%   Weight:    49.5 kg  Height:        Intake/Output Summary (Last 24 hours) at 04/16/2020 1108 Last data filed at 04/16/2020 1020 Gross per 24 hour  Intake 2360.75 ml  Output 2350 ml  Net 10.75 ml   Filed Weights   04/14/20 0500 04/15/20 0608 04/16/20 0436  Weight: 48.8 kg 50.1 kg 49.5 kg    Examination:  General exam: Appears calm and comfortable Respiratory system: Clear to auscultation. Respiratory effort normal. Cardiovascular system: S1 & S2 heard, RRR. No murmurs, rubs, gallops or clicks. Gastrointestinal system: Abdomen is distended, soft and tender in LLQ. Improved bowel sounds heard. Central nervous system: Alert and oriented. No focal neurological deficits. Extremities: No edema. No calf tenderness Skin: No cyanosis. No rashes Psychiatry: Judgement and insight appear normal. Mood & affect appropriate.     Data Reviewed: I have personally reviewed following labs and imaging studies  CBC: Recent Labs  Lab 04/11/20 1542 04/12/20 0221 04/14/20 0431 04/15/20 0303 04/16/20 0138  WBC 20.8* 19.3* 18.6* 18.7* 14.9*  NEUTROABS 18.0* 15.5*  --   --   --   HGB 16.3* 14.7 12.6 13.1 12.2  HCT 46.2*  43.0 39.1 40.3 37.9  MCV 97.1 100.0 105.7* 104.9* 105.9*  PLT 211 250 219 252 [id number]   Basic Metabolic Panel: Recent Labs  Lab 04/11/20 1542 04/11/20 1542 04/12/20 0221 04/13/20 0456 04/14/20 0431 04/15/20 0303 04/16/20 0138  NA 129*   < > 133* 136 136 138 139  K 3.6   < > 3.1* 3.5 3.2* 3.6 3.0*  CL 81*    < > 85* 86* 88* 91* 89*  CO2 28   < > 32 35* 32 31 33*  GLUCOSE 154*   < > 124* 109* 82 92 85  BUN 87*   < > 87* 55* 36* 25* 18  CREATININE 1.68*   < > 1.62* 1.17* 0.77 0.85 0.76  CALCIUM 8.8*   < > 8.5* 9.0 8.6* 8.5* 8.1*  MG 2.1  --  2.2  --   --   --   --    < > = values in this interval not displayed.   GFR: Estimated Creatinine Clearance: 53.3 mL/min (by C-G formula based on SCr of 0.76 mg/dL). Liver Function Tests: Recent Labs  Lab 04/11/20 1542 04/12/20 0221  AST 18 16  ALT 13 12  ALKPHOS 93 93  BILITOT 1.0 1.1  PROT 7.7 7.4  ALBUMIN 4.9 4.6   Recent Labs  Lab 04/11/20 1542  LIPASE 59*   No results for input(s): AMMONIA in the last 168 hours. Coagulation Profile: Recent Labs  Lab 04/12/20 0221  INR 0.9   Cardiac Enzymes: No results for input(s): CKTOTAL, CKMB, CKMBINDEX, TROPONINI in the last 168 hours. BNP (last 3 results) No results for input(s): PROBNP in the last 8760 hours. HbA1C: No results for input(s): HGBA1C in the last 72 hours. CBG: No results for input(s): GLUCAP in the last 168 hours. Lipid Profile: No results for input(s): CHOL, HDL, LDLCALC, TRIG, CHOLHDL, LDLDIRECT in the last 72 hours. Thyroid Function Tests: No results for input(s): TSH, T4TOTAL, FREET4, T3FREE, THYROIDAB in the last 72 hours. Anemia Panel: No results for input(s): VITAMINB12, FOLATE, FERRITIN, TIBC, IRON, RETICCTPCT in the last 72 hours. Sepsis Labs: No results for input(s): PROCALCITON, LATICACIDVEN in the last 168 hours.  Recent Results (from the past 240 hour(s))  Respiratory Panel by RT PCR (Flu A&B, Covid) - Nasopharyngeal Swab     Status: None   Collection Time: 04/11/20  6:49 PM   Specimen: Nasopharyngeal Swab  Result Value Ref Range Status   SARS Coronavirus 2 by RT PCR NEGATIVE NEGATIVE Final    Comment: (NOTE) SARS-CoV-2 target nucleic acids are NOT DETECTED. The SARS-CoV-2 RNA is generally detectable in upper respiratoy specimens during the acute phase  of infection. The lowest concentration of SARS-CoV-2 viral copies this assay can detect is 131 copies/mL. A negative result does not preclude SARS-Cov-2 infection and should not be used as the sole basis for treatment or other patient management decisions. A negative result may occur with  improper specimen collection/handling, submission of specimen other than nasopharyngeal swab, presence of viral mutation(s) within the areas targeted by this assay, and inadequate number of viral copies (<131 copies/mL). A negative result must be combined with clinical observations, patient history, and epidemiological information. The expected result is Negative. Fact Sheet for Patients:  PinkCheek.be Fact Sheet for Healthcare Providers:  GravelBags.it This test is not yet ap proved or cleared by the [place] FDA and  has been authorized for detection and/or diagnosis of SARS-CoV-2 by FDA under an Emergency Use Authorization (EUA). This EUA will remain  in effect (meaning this test can be used) for the duration of the COVID-19 declaration under Section 564(b)(1) of the Act, 21 U.S.C. section 360bbb-3(b)(1), unless the authorization is terminated or revoked sooner.    Influenza A by PCR NEGATIVE NEGATIVE Final   Influenza B by PCR NEGATIVE NEGATIVE Final    Comment: (NOTE) The Xpert Xpress SARS-CoV-2/FLU/RSV assay is intended as an aid in  the diagnosis of influenza from Nasopharyngeal swab specimens and  should not be used as a sole basis for treatment. Nasal washings and  aspirates are unacceptable for Xpert Xpress SARS-CoV-2/FLU/RSV  testing. Fact Sheet for Patients: PinkCheek.be Fact Sheet for Healthcare Providers: GravelBags.it This test is not yet approved or cleared by the [place] FDA and  has been authorized for detection and/or diagnosis of SARS-CoV-2 by  FDA  under an Emergency Use Authorization (EUA). This EUA will remain  in effect (meaning this test can be used) for the duration of the  Covid-19 declaration under Section 564(b)(1) of the Act, 21  U.S.C. section 360bbb-3(b)(1), unless the authorization is  terminated or revoked. Performed at [facility], [place] [address], [place], [place] [address]          Radiology Studies: DG Abd Portable 1V  Result Date: 04/15/2020 CLINICAL DATA:  Small-bowel obstruction. EXAM: PORTABLE ABDOMEN - 1 VIEW COMPARISON:  1 day prior FINDINGS: Single supine view of the abdomen and pelvis. The upper abdomen is excluded. Nasogastric tube is looped in the stomach. Contrast identified within the colon. Small bowel dilatation is relatively similar, including at 3.6 cm. Distal gas identified. No gross free intraperitoneal air. IMPRESSION: Similar partial small bowel obstruction. Nasogastric tube incompletely imaged but looped in the stomach. Electronically Signed   By: Abigail Miyamoto M.D.   On: 04/15/2020 06:51        Scheduled Meds: . Chlorhexidine Gluconate Cloth  6 each Topical Daily  . enoxaparin (LOVENOX) injection  40 mg Subcutaneous Q24H  . sodium chloride flush  3 mL Intravenous Q12H   Continuous Infusions: . lactated ringers 100 mL/hr at 04/16/20 1020     LOS: 5 days     Cordelia Poche, MD [facility] 04/16/2020, 11:08 AM  If 7PM-7AM, please contact night-coverage [email or web address]

## 2020-04-17 ENCOUNTER — Inpatient Hospital Stay (HOSPITAL_COMMUNITY): Payer: Medicare Other

## 2020-04-17 DIAGNOSIS — K56609 Unspecified intestinal obstruction, unspecified as to partial versus complete obstruction: Secondary | ICD-10-CM | POA: Diagnosis not present

## 2020-04-17 DIAGNOSIS — E871 Hypo-osmolality and hyponatremia: Secondary | ICD-10-CM | POA: Diagnosis not present

## 2020-04-17 DIAGNOSIS — N179 Acute kidney failure, unspecified: Secondary | ICD-10-CM | POA: Diagnosis not present

## 2020-04-17 DIAGNOSIS — R112 Nausea with vomiting, unspecified: Secondary | ICD-10-CM | POA: Diagnosis not present

## 2020-04-17 LAB — CBC
HCT: 38.1 % (ref 36.0–46.0)
Hemoglobin: 12.4 g/dL (ref 12.0–15.0)
MCH: 34.9 pg — ABNORMAL HIGH (ref 26.0–34.0)
MCHC: 32.5 g/dL (ref 30.0–36.0)
MCV: 107.3 fL — ABNORMAL HIGH (ref 80.0–100.0)
Platelets: 255 10*3/uL (ref 150–400)
RBC: 3.55 MIL/uL — ABNORMAL LOW (ref 3.87–5.11)
RDW: 12.8 % (ref 11.5–15.5)
WBC: 14.9 10*3/uL — ABNORMAL HIGH (ref 4.0–10.5)
nRBC: 0 % (ref 0.0–0.2)

## 2020-04-17 LAB — RENAL FUNCTION PANEL
Albumin: 3 g/dL — ABNORMAL LOW (ref 3.5–5.0)
Anion gap: 17 — ABNORMAL HIGH (ref 5–15)
BUN: 15 mg/dL (ref 8–23)
CO2: 33 mmol/L — ABNORMAL HIGH (ref 22–32)
Calcium: 8.1 mg/dL — ABNORMAL LOW (ref 8.9–10.3)
Chloride: 89 mmol/L — ABNORMAL LOW (ref 98–111)
Creatinine, Ser: 0.83 mg/dL (ref 0.44–1.00)
GFR calc Af Amer: >60 mL/min (ref >60)
GFR calc non Af Amer: >60 mL/min (ref >60)
Glucose, Bld: 93 mg/dL (ref 70–99)
Phosphorus: 2.5 mg/dL (ref 2.5–4.6)
Potassium: 3.4 mmol/L — ABNORMAL LOW (ref 3.5–5.1)
Sodium: 139 mmol/L (ref 135–145)

## 2020-04-17 LAB — MAGNESIUM: Magnesium: 1.6 mg/dL — ABNORMAL LOW (ref 1.7–2.4)

## 2020-04-17 LAB — GLUCOSE, CAPILLARY
Glucose-Capillary: 137 mg/dL — ABNORMAL HIGH (ref 70–99)
Glucose-Capillary: 162 mg/dL — ABNORMAL HIGH (ref 70–99)

## 2020-04-17 MED ORDER — MAGNESIUM SULFATE 2 GM/50ML IV SOLN
2.0000 g | Freq: Once | INTRAVENOUS | Status: AC
  Administered 2020-04-17: 2 g via INTRAVENOUS
  Filled 2020-04-17: qty 50

## 2020-04-17 MED ORDER — INSULIN ASPART 100 UNIT/ML ~~LOC~~ SOLN: 0.0000 [IU] | SUBCUTANEOUS | Status: DC

## 2020-04-17 MED ORDER — TRAVASOL 10 % IV SOLN
INTRAVENOUS | Status: AC
  Filled 2020-04-17: qty 360

## 2020-04-17 MED ORDER — INSULIN ASPART 100 UNIT/ML ~~LOC~~ SOLN
0.0000 [IU] | Freq: Four times a day (QID) | SUBCUTANEOUS | Status: DC
  Administered 2020-04-17: 1 [IU] via SUBCUTANEOUS
  Administered 2020-04-18 (×4): 2 [IU] via SUBCUTANEOUS
  Administered 2020-04-19: 3 [IU] via SUBCUTANEOUS
  Administered 2020-04-19: 2 [IU] via SUBCUTANEOUS
  Administered 2020-04-19: 1 [IU] via SUBCUTANEOUS
  Administered 2020-04-20 (×2): 5 [IU] via SUBCUTANEOUS
  Administered 2020-04-20: 1 [IU] via SUBCUTANEOUS
  Administered 2020-04-21 (×2): 3 [IU] via SUBCUTANEOUS
  Administered 2020-04-21: 1 [IU] via SUBCUTANEOUS
  Administered 2020-04-22: 2 [IU] via SUBCUTANEOUS
  Administered 2020-04-23 – 2020-04-24 (×4): 1 [IU] via SUBCUTANEOUS
  Administered 2020-04-25: 2 [IU] via SUBCUTANEOUS
  Administered 2020-04-25 – 2020-04-26 (×5): 1 [IU] via SUBCUTANEOUS

## 2020-04-17 MED ORDER — POTASSIUM CHLORIDE 20 MEQ/15ML (10%) PO SOLN
40.0000 meq | Freq: Once | ORAL | Status: AC
  Administered 2020-04-17: 40 meq via ORAL
  Filled 2020-04-17: qty 30

## 2020-04-17 NOTE — Progress Notes (Addendum)
PHARMACY - TOTAL PARENTERAL NUTRITION CONSULT NOTE   Indication: small bowel obstruction  Patient Measurements: Height: 5\' 6"  (167.6 cm) Weight: 49.7 kg (109 lb 9.1 oz) IBW/kg (Calculated) : 59.3   Body mass index is 17.68 kg/m.  Assessment: 82 y/oF with PMH of metastatic colon cancer, abdominal wall thrombus who presented on 04/11/2020 secondary to abdominal pain and found to have SBO. Pharmacy consulted for TPN.   Glucose / Insulin: No hx of DM. Glucose 93 on BMET this AM.  Electrolytes: Na+, Corrected Calcium WNL. K+, Mag low. Phos on lower end of normal range. Chloride low at 89, CO2 slightly elevated at 33.  Renal: SCr 0.83, WNL LFTs / TGs: LFTs WNL (4/14)/Triglycerides ordered for AM Prealbumin / albumin: 14.7 (4/16)/3 (4/19) Intake / Output; MIVF: 2432mL/2650mL (urine: 518mL; Emesis/NG output: 2167mL); LR at 100 mL/hr GI Imaging: 4/13 CT abd/pelvis significant for SBO with transition point in the mid abdomen. 4/19 AXR: Mild improvement in the pattern of partial small bowel obstruction with interval evidence of mucosal thickening involving the dilated loops of jejunum without pneumatosis.  Surgeries / Procedures:   Central access: port  TPN start date: 04/17/2020  Nutritional Goals  RD recommendations pending   Current Nutrition:  NPO  Plan:  -K+ and Magnesium repletion ordered per MD  -At 1800: Start TPN at 54mL/hr with 50g/L amino acids, 15% dextrose, and 30g/L lipds.  Electrolytes in TPN: 56mEq/L of Na, 68mEq/L of K, 4mEq/L of Ca, 3mEq/L of Mag, and 74mmol/L of Phos. Cl:Ac ratio 2:1 Add standard MVI and trace elements to TPN.  Initiate Sensitive q6h SSI and adjust as needed.   MIVF management per MD.  Monitor TPN labs on Mon/Thurs.  CMET, CBC with diff, Mag, Phos, Triglycerides in AM.  Monitor for refeeding syndrome.     Lindell Spar, PharmD, BCPS Clinical Pharmacist  04/17/2020,12:07 PM

## 2020-04-17 NOTE — Progress Notes (Signed)
Patient ID: Yolanda Watson, female   DOB: [birth year], 69 y.o.   MRN: [id number]       Subjective: Patient feels worse today.  crampy pain.  States she isn't having more flatus really.  ROS: See above, otherwise other systems negative  Objective: Vital signs in last 24 hours: Temp:  [97.9 F (36.6 C)-98.5 F (36.9 C)] 98.1 F (36.7 C) (04/19 0450) Pulse Rate:  [76-83] 78 (04/19 0450) Resp:  [16] 16 (04/19 0450) BP: (113-133)/(66-76) 126/76 (04/19 0450) SpO2:  [92 %-96 %] 96 % (04/19 0450) Weight:  [49.7 kg] 49.7 kg (04/19 0448) Last BM Date: 04/13/20  Intake/Output from previous day: 04/18 0701 - 04/19 0700 In: 2416.1 [I.V.:1896.2; IV Piggyback:519.8] Out: 2650 [Urine:500; Emesis/NG output:2150] Intake/Output this shift: Total I/O In: -  Out: 1050 [Urine:200; Emesis/NG output:850]  PE: Abd: soft, tender diffusely, but no peritonitis, few BS, NGT in place with cannister just changed.  Lab Results:  Recent Labs    04/16/20 0138 04/17/20 0140  WBC 14.9* 14.9*  HGB 12.2 12.4  HCT 37.9 38.1  PLT 242 255   BMET Recent Labs    04/16/20 0138 04/17/20 0140  NA 139 139  K 3.0* 3.4*  CL 89* 89*  CO2 33* 33*  GLUCOSE 85 93  BUN 18 15  CREATININE 0.76 0.83  CALCIUM 8.1* 8.1*   PT/INR No results for input(s): LABPROT, INR in the last 72 hours. CMP     Component Value Date/Time   NA 139 04/17/2020 0140   NA 141 01/15/2018 0000   NA 138 08/07/2017 0835   K 3.4 (L) 04/17/2020 0140   K 4.1 08/07/2017 0835   CL 89 (L) 04/17/2020 0140   CO2 33 (H) 04/17/2020 0140   CO2 25 08/07/2017 0835   GLUCOSE 93 04/17/2020 0140   GLUCOSE 95 08/07/2017 0835   BUN 15 04/17/2020 0140   BUN 4 01/15/2018 0000   BUN 9.3 08/07/2017 0835   CREATININE 0.83 04/17/2020 0140   CREATININE 0.80 03/16/2020 1002   CREATININE 0.57 02/05/2018 1144   CREATININE 0.7 08/07/2017 0835   CALCIUM 8.1 (L) 04/17/2020 0140   CALCIUM 9.4 08/07/2017 0835   PROT 7.4 04/12/2020 0221   PROT 7.3  08/07/2017 0835   ALBUMIN 3.0 (L) 04/17/2020 0140   ALBUMIN 3.4 (L) 08/07/2017 0835   AST 16 04/12/2020 0221   AST 13 (L) 03/16/2020 1002   AST 25 08/07/2017 0835   ALT 12 04/12/2020 0221   ALT 11 03/16/2020 1002   ALT 21 08/07/2017 0835   ALKPHOS 93 04/12/2020 0221   ALKPHOS 138 08/07/2017 0835   BILITOT 1.1 04/12/2020 0221   BILITOT 0.4 03/16/2020 1002   BILITOT 0.49 08/07/2017 0835   GFRNONAA >60 04/17/2020 0140   GFRNONAA >60 03/16/2020 1002   GFRAA >60 04/17/2020 0140   GFRAA >60 03/16/2020 1002   Lipase     Component Value Date/Time   LIPASE 59 (H) 04/11/2020 1542       Studies/Results: DG Abd Portable 1V  Result Date: 04/17/2020 CLINICAL DATA:  Small-bowel obstruction. EXAM: PORTABLE ABDOMEN - 1 VIEW COMPARISON:  04/15/2020 FINDINGS: Mild decrease in caliber of multiple loops of dilated jejunum. Interval mucosal thickening involving those dilated loops. Gas and dilute oral contrast in normal caliber colon. Nasogastric tube tip and side hole in the proximal stomach. Lumbar spine degenerative changes and mild scoliosis. IMPRESSION: Mild improvement in the pattern of partial small bowel obstruction with interval evidence of mucosal thickening involving the dilated  loops of jejunum without pneumatosis. This could be due to infectious, inflammatory or ischemic enteritis. Electronically Signed   By: Claudie Revering M.D.   On: 04/17/2020 08:03    Anti-infectives: Anti-infectives (From admission, onward)   None       Assessment/Plan Chronic tobacco use Abdominal wall thrombus-on Xarelto Hx EtOH abuse AKI Malnutrition - prealbumin 14.7 (4/16)  Metastatic colon cancerwith right hemicolectomy 2016 - ongoing chemotherapy, followed by Dr. Benay Spice -Excision of malignant mass from the abdominal including skin, muscle, fascia and peritoneum(15 x 12cm), abdominal reconstruction with [name], 10/03/17 Dr. Barry Dienes and [name]   SBO -Ng output still high but may be  partly ice.  Films somewhat improved, but still with some dilated SB, but with contrast and air in the colon too. -more pain today -if continues to not open up may require surgery -if continues NPO, will also likely need PICC/TNA -Will continue NG to suction for now and repeat film in am again.  FEN - NPO/NGT/IVFs VTE - Lovenox ID - none currently   LOS: 6 days    Henreitta Cea , [facility] 04/17/2020, 11:00 AM Please see Amion for pager number during day hours 7:00am-4:30pm or 7:00am -11:30am on weekends

## 2020-04-17 NOTE — Progress Notes (Signed)
PROGRESS NOTE    Yolanda Watson  [id number] DOB: 1951-02-15 DOA: 04/11/2020 PCP: Patient, No Pcp Per   Brief Narrative: Yolanda Watson is a 69 y.o. 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 Patient presented secondary to abdominal pain and found to have an SBO.   Assessment & Plan:   Principal Problem:   SBO (small bowel obstruction) (HCC) Active Problems:   Abdominal pain   Nausea & vomiting   Acute hyponatremia   AKI (acute kidney injury) ([place])   Leukocytosis   Partial small bowel obstruction CT scan significant for SBO with transition point located in the midabdomen. History of multiple intraabdominal surgeries. NG tube placed and on intermittent suction. NG output of 1.38 L in the last 24 hours. -General surgery consult: non-operative management for now, NG tube decompression, ambulate -Will discuss with general surgery today with regard to initiation of TPN; if starting today, will place peripheral line and use port for TPN. Will need to watch for refeeding syndrome.  Hyponatremia Likely secondary to some mild hypovolemia. Resolved with IV fluids.  Hypokalemia -Replete as needed  AKI Baseline creatinine of 0.8. Creatinine of 1.68 on admission. BUN significantly elevated and now improved. Resolved. -Continue LR fluids  Metabolic alkalosis Secondary to GI losses. Associated low chloride and elevated anion gap.  Leukocytosis In setting of SBO. Improved and now stabilized. Still slightly elevated. -Trend CBC  Metastatic colon cancer Patient is currently on chemotherapy. Next cycle planned for about one week.  Pancreatic duct dilation Incidental finding on CT abdomen/pelvis. No mass lesion noted. Patient is currently asymptomatic. Lipase minimally elevated. Will need outpatient monitoring.  Abdominal wall thrombus On Xarelto as an outpatient. Held on  admission -VTE prophylaxis with Lovenox   DVT prophylaxis: Lovenox Code Status:   Code Status: DNR Family Communication: Husband at bedside Disposition Plan: Discharge pending continued general surgery recommendations   Consultants:   General surgery  Procedures:   NG tube (4/13>>  Antimicrobials:  None    Subjective: Not passing gas. Feels her stomach moving around and making noises. No nausea or vomiting.  Objective: Vitals:   04/16/20 1424 04/16/20 2220 04/17/20 0448 04/17/20 0450  BP: 113/66 133/68  126/76  Pulse: 76 83  78  Resp: 16 16  16   Temp: 98.5 F (36.9 C) 97.9 F (36.6 C)  98.1 F (36.7 C)  TempSrc: Oral Oral  Oral  SpO2: 93% 92%  96%  Weight:   49.7 kg   Height:        Intake/Output Summary (Last 24 hours) at 04/17/2020 1132 Last data filed at 04/17/2020 1000 Gross per 24 hour  Intake 1983.17 ml  Output 3350 ml  Net -1366.83 ml   Filed Weights   04/15/20 0608 04/16/20 0436 04/17/20 0448  Weight: 50.1 kg 49.5 kg 49.7 kg    Examination:  General exam: Appears calm and comfortable Respiratory system: Clear to auscultation. Respiratory effort normal. Cardiovascular system: S1 & S2 heard, RRR. No murmurs, rubs, gallops or clicks. Gastrointestinal system: Abdomen is distended, soft and tender. Normal bowel sounds heard. Central nervous system: Alert and oriented. No focal neurological deficits. Extremities: No edema. No calf tenderness Skin: No cyanosis. No rashes Psychiatry: Judgement and insight appear normal. Mood & affect appropriate.      Data Reviewed: I have personally reviewed following labs and imaging studies  CBC: Recent Labs  Lab 04/11/20 1542 04/11/20 1542 04/12/20 0221 04/14/20 0431 04/15/20  0303 04/16/20 0138 04/17/20 0140  WBC 20.8*   < > 19.3* 18.6* 18.7* 14.9* 14.9*  NEUTROABS 18.0*  --  15.5*  --   --   --   --   HGB 16.3*   < > 14.7 12.6 13.1 12.2 12.4  HCT 46.2*   < > 43.0 39.1 40.3 37.9 38.1  MCV 97.1   < >  100.0 105.7* 104.9* 105.9* 107.3*  PLT 211   < > 250 219 252 242 255   < > = values in this interval not displayed.   Basic Metabolic Panel: Recent Labs  Lab 04/11/20 1542 04/11/20 1542 04/12/20 0221 04/12/20 0221 04/13/20 0456 04/14/20 0431 04/15/20 0303 04/16/20 0138 04/16/20 1116 04/17/20 0140  NA 129*   < > 133*   < > 136 136 138 139  --  139  K 3.6   < > 3.1*   < > 3.5 3.2* 3.6 3.0*  --  3.4*  CL 81*   < > 85*   < > 86* 88* 91* 89*  --  89*  CO2 28   < > 32   < > 35* 32 31 33*  --  33*  GLUCOSE 154*   < > 124*   < > 109* 82 92 85  --  93  BUN 87*   < > 87*   < > 55* 36* 25* 18  --  15  CREATININE 1.68*   < > 1.62*   < > 1.17* 0.77 0.85 0.76  --  0.83  CALCIUM 8.8*   < > 8.5*   < > 9.0 8.6* 8.5* 8.1*  --  8.1*  MG 2.1  --  2.2  --   --   --   --   --  1.8 1.6*  PHOS  --   --   --   --   --   --   --   --   --  2.5   < > = values in this interval not displayed.   GFR: Estimated Creatinine Clearance: 51.6 mL/min (by C-G formula based on SCr of 0.83 mg/dL). Liver Function Tests: Recent Labs  Lab 04/11/20 1542 04/12/20 0221 04/17/20 0140  AST 18 16  --   ALT 13 12  --   ALKPHOS 93 93  --   BILITOT 1.0 1.1  --   PROT 7.7 7.4  --   ALBUMIN 4.9 4.6 3.0*   Recent Labs  Lab 04/11/20 1542  LIPASE 59*   No results for input(s): AMMONIA in the last 168 hours. Coagulation Profile: Recent Labs  Lab 04/12/20 0221  INR 0.9   Cardiac Enzymes: No results for input(s): CKTOTAL, CKMB, CKMBINDEX, TROPONINI in the last 168 hours. BNP (last 3 results) No results for input(s): PROBNP in the last 8760 hours. HbA1C: No results for input(s): HGBA1C in the last 72 hours. CBG: No results for input(s): GLUCAP in the last 168 hours. Lipid Profile: No results for input(s): CHOL, HDL, LDLCALC, TRIG, CHOLHDL, LDLDIRECT in the last 72 hours. Thyroid Function Tests: No results for input(s): TSH, T4TOTAL, FREET4, T3FREE, THYROIDAB in the last 72 hours. Anemia Panel: No results for  input(s): VITAMINB12, FOLATE, FERRITIN, TIBC, IRON, RETICCTPCT in the last 72 hours. Sepsis Labs: No results for input(s): PROCALCITON, LATICACIDVEN in the last 168 hours.  Recent Results (from the past 240 hour(s))  Respiratory Panel by RT PCR (Flu A&B, Covid) - Nasopharyngeal Swab     Status: None  Collection Time: 04/11/20  6:49 PM   Specimen: Nasopharyngeal Swab  Result Value Ref Range Status   SARS Coronavirus 2 by RT PCR NEGATIVE NEGATIVE Final    Comment: (NOTE) SARS-CoV-2 target nucleic acids are NOT DETECTED. The SARS-CoV-2 RNA is generally detectable in upper respiratoy specimens during the acute phase of infection. The lowest concentration of SARS-CoV-2 viral copies this assay can detect is 131 copies/mL. A negative result does not preclude SARS-Cov-2 infection and should not be used as the sole basis for treatment or other patient management decisions. A negative result may occur with  improper specimen collection/handling, submission of specimen other than nasopharyngeal swab, presence of viral mutation(s) within the areas targeted by this assay, and inadequate number of viral copies (<131 copies/mL). A negative result must be combined with clinical observations, patient history, and epidemiological information. The expected result is Negative. Fact Sheet for Patients:  PinkCheek.be Fact Sheet for Healthcare Providers:  GravelBags.it This test is not yet ap proved or cleared by the [place] FDA and  has been authorized for detection and/or diagnosis of SARS-CoV-2 by FDA under an Emergency Use Authorization (EUA). This EUA will remain  in effect (meaning this test can be used) for the duration of the COVID-19 declaration under Section 564(b)(1) of the Act, 21 U.S.C. section 360bbb-3(b)(1), unless the authorization is terminated or revoked sooner.    Influenza A by PCR NEGATIVE NEGATIVE Final   Influenza B  by PCR NEGATIVE NEGATIVE Final    Comment: (NOTE) The Xpert Xpress SARS-CoV-2/FLU/RSV assay is intended as an aid in  the diagnosis of influenza from Nasopharyngeal swab specimens and  should not be used as a sole basis for treatment. Nasal washings and  aspirates are unacceptable for Xpert Xpress SARS-CoV-2/FLU/RSV  testing. Fact Sheet for Patients: PinkCheek.be Fact Sheet for Healthcare Providers: GravelBags.it This test is not yet approved or cleared by the [place] FDA and  has been authorized for detection and/or diagnosis of SARS-CoV-2 by  FDA under an Emergency Use Authorization (EUA). This EUA will remain  in effect (meaning this test can be used) for the duration of the  Covid-19 declaration under Section 564(b)(1) of the Act, 21  U.S.C. section 360bbb-3(b)(1), unless the authorization is  terminated or revoked. Performed at [facility], [place] [address], [place], [place] [address]          Radiology Studies: DG Abd Portable 1V  Result Date: 04/17/2020 CLINICAL DATA:  Small-bowel obstruction. EXAM: PORTABLE ABDOMEN - 1 VIEW COMPARISON:  04/15/2020 FINDINGS: Mild decrease in caliber of multiple loops of dilated jejunum. Interval mucosal thickening involving those dilated loops. Gas and dilute oral contrast in normal caliber colon. Nasogastric tube tip and side hole in the proximal stomach. Lumbar spine degenerative changes and mild scoliosis. IMPRESSION: Mild improvement in the pattern of partial small bowel obstruction with interval evidence of mucosal thickening involving the dilated loops of jejunum without pneumatosis. This could be due to infectious, inflammatory or ischemic enteritis. Electronically Signed   By: Claudie Revering M.D.   On: 04/17/2020 08:03        Scheduled Meds: . Chlorhexidine Gluconate Cloth  6 each Topical Daily  . enoxaparin (LOVENOX) injection  40 mg Subcutaneous Q24H   . sodium chloride flush  3 mL Intravenous Q12H   Continuous Infusions: . lactated ringers 100 mL/hr at 04/17/20 0957     LOS: 6 days     Cordelia Poche, MD [facility] 04/17/2020, 11:32 AM  If 7PM-7AM, please  contact night-coverage [email or web address]

## 2020-04-17 NOTE — Care Management Important Message (Signed)
Important Message  Patient Details IM Letter given to [place] Case Manager to present to the Patient Name: Yolanda Watson MRN: [id number] Date of Birth: 17-May-1951   Medicare Important Message Given:  Yes     Kerin Salen 04/17/2020, 11:01 AM

## 2020-04-18 ENCOUNTER — Inpatient Hospital Stay (HOSPITAL_COMMUNITY): Payer: Medicare Other

## 2020-04-18 DIAGNOSIS — E871 Hypo-osmolality and hyponatremia: Secondary | ICD-10-CM | POA: Diagnosis not present

## 2020-04-18 DIAGNOSIS — R112 Nausea with vomiting, unspecified: Secondary | ICD-10-CM | POA: Diagnosis not present

## 2020-04-18 DIAGNOSIS — K56609 Unspecified intestinal obstruction, unspecified as to partial versus complete obstruction: Secondary | ICD-10-CM | POA: Diagnosis not present

## 2020-04-18 DIAGNOSIS — N179 Acute kidney failure, unspecified: Secondary | ICD-10-CM | POA: Diagnosis not present

## 2020-04-18 LAB — DIFFERENTIAL
Abs Immature Granulocytes: 0.08 10*3/uL — ABNORMAL HIGH (ref 0.00–0.07)
Basophils Absolute: 0.1 10*3/uL (ref 0.0–0.1)
Basophils Relative: 0 %
Eosinophils Absolute: 0.2 10*3/uL (ref 0.0–0.5)
Eosinophils Relative: 2 %
Immature Granulocytes: 1 %
Lymphocytes Relative: 8 %
Lymphs Abs: 0.9 10*3/uL (ref 0.7–4.0)
Monocytes Absolute: 1 10*3/uL (ref 0.1–1.0)
Monocytes Relative: 9 %
Neutro Abs: 9 10*3/uL — ABNORMAL HIGH (ref 1.7–7.7)
Neutrophils Relative %: 80 %

## 2020-04-18 LAB — COMPREHENSIVE METABOLIC PANEL
ALT: 9 U/L (ref 0–44)
AST: 12 U/L — ABNORMAL LOW (ref 15–41)
Albumin: 2.9 g/dL — ABNORMAL LOW (ref 3.5–5.0)
Alkaline Phosphatase: 58 U/L (ref 38–126)
Anion gap: 12 (ref 5–15)
BUN: 18 mg/dL (ref 8–23)
CO2: 36 mmol/L — ABNORMAL HIGH (ref 22–32)
Calcium: 8.3 mg/dL — ABNORMAL LOW (ref 8.9–10.3)
Chloride: 91 mmol/L — ABNORMAL LOW (ref 98–111)
Creatinine, Ser: 0.76 mg/dL (ref 0.44–1.00)
GFR calc Af Amer: >60 mL/min (ref >60)
GFR calc non Af Amer: >60 mL/min (ref >60)
Glucose, Bld: 173 mg/dL — ABNORMAL HIGH (ref 70–99)
Potassium: 3.8 mmol/L (ref 3.5–5.1)
Sodium: 139 mmol/L (ref 135–145)
Total Bilirubin: 0.6 mg/dL (ref 0.3–1.2)
Total Protein: 5.8 g/dL — ABNORMAL LOW (ref 6.5–8.1)

## 2020-04-18 LAB — CBC
HCT: 41.7 % (ref 36.0–46.0)
Hemoglobin: 13.2 g/dL (ref 12.0–15.0)
MCH: 33.7 pg (ref 26.0–34.0)
MCHC: 31.7 g/dL (ref 30.0–36.0)
MCV: 106.4 fL — ABNORMAL HIGH (ref 80.0–100.0)
Platelets: 260 10*3/uL (ref 150–400)
RBC: 3.92 MIL/uL (ref 3.87–5.11)
RDW: 13 % (ref 11.5–15.5)
WBC: 11.1 10*3/uL — ABNORMAL HIGH (ref 4.0–10.5)
nRBC: 0 % (ref 0.0–0.2)

## 2020-04-18 LAB — PHOSPHORUS: Phosphorus: 2.5 mg/dL (ref 2.5–4.6)

## 2020-04-18 LAB — RENAL FUNCTION PANEL
Albumin: 3 g/dL — ABNORMAL LOW (ref 3.5–5.0)
Anion gap: 10 (ref 5–15)
BUN: 19 mg/dL (ref 8–23)
CO2: 38 mmol/L — ABNORMAL HIGH (ref 22–32)
Calcium: 8.3 mg/dL — ABNORMAL LOW (ref 8.9–10.3)
Chloride: 92 mmol/L — ABNORMAL LOW (ref 98–111)
Creatinine, Ser: 0.7 mg/dL (ref 0.44–1.00)
GFR calc Af Amer: >60 mL/min (ref >60)
GFR calc non Af Amer: >60 mL/min (ref >60)
Glucose, Bld: 175 mg/dL — ABNORMAL HIGH (ref 70–99)
Phosphorus: 2.6 mg/dL (ref 2.5–4.6)
Potassium: 3.8 mmol/L (ref 3.5–5.1)
Sodium: 140 mmol/L (ref 135–145)

## 2020-04-18 LAB — GLUCOSE, CAPILLARY
Glucose-Capillary: 162 mg/dL — ABNORMAL HIGH (ref 70–99)
Glucose-Capillary: 166 mg/dL — ABNORMAL HIGH (ref 70–99)
Glucose-Capillary: 168 mg/dL — ABNORMAL HIGH (ref 70–99)

## 2020-04-18 LAB — TRIGLYCERIDES: Triglycerides: 99 mg/dL (ref <150)

## 2020-04-18 LAB — PREALBUMIN: Prealbumin: <5 mg/dL — ABNORMAL LOW (ref 18–38)

## 2020-04-18 LAB — MAGNESIUM: Magnesium: 2.4 mg/dL (ref 1.7–2.4)

## 2020-04-18 MED ORDER — POTASSIUM CHLORIDE CRYS ER 20 MEQ PO TBCR
20.0000 meq | EXTENDED_RELEASE_TABLET | Freq: Once | ORAL | Status: AC
  Administered 2020-04-18: 20 meq via ORAL
  Filled 2020-04-18: qty 1

## 2020-04-18 MED ORDER — LACTATED RINGERS IV SOLN: INTRAVENOUS | Status: DC

## 2020-04-18 MED ORDER — CIPROFLOXACIN IN D5W 400 MG/200ML IV SOLN
400.0000 mg | INTRAVENOUS | Status: AC
  Administered 2020-04-19: 400 mg via INTRAVENOUS
  Filled 2020-04-18: qty 200

## 2020-04-18 MED ORDER — POTASSIUM CHLORIDE 10 MEQ/50ML IV SOLN
10.0000 meq | INTRAVENOUS | Status: DC
  Filled 2020-04-18 (×2): qty 50

## 2020-04-18 MED ORDER — TRAVASOL 10 % IV SOLN
INTRAVENOUS | Status: AC
  Filled 2020-04-18: qty 748.8

## 2020-04-18 NOTE — H&P (View-Only) (Signed)
Patient ID: Yolanda Watson, female   DOB: [birth year], 69 y.o.   MRN: [id number]       Subjective: Still with not much change.  Her pain is better today, but she is not passing flatus or having any BMs.    ROS: See above, otherwise other systems negative  Objective: Vital signs in last 24 hours: Temp:  [97.6 F (36.4 C)-97.8 F (36.6 C)] 97.8 F (36.6 C) (04/20 0602) Pulse Rate:  [83-90] 86 (04/20 0602) Resp:  [16-18] 16 (04/20 0602) BP: (121-132)/(69-80) 132/69 (04/20 0602) SpO2:  [90 %-93 %] 93 % (04/20 0602) Weight:  [49.5 kg] 49.5 kg (04/19 1143) Last BM Date: 04/13/20  Intake/Output from previous day: 04/19 0701 - 04/20 0700 In: 1824.6 [P.O.:720; I.V.:1054.6; IV Piggyback:50] Out: 3800 [Urine:1300; Emesis/NG output:2500] Intake/Output this shift: Total I/O In: -  Out: 150 [Urine:150]  PE: Abd: soft, but more bloated than yesterday.  Still with high NGT output around 2.5L or bilious brown output.  Some BS, still tender, but not as significant as yesterday.  Lab Results:  Recent Labs    04/17/20 0140 04/18/20 0456  WBC 14.9* 11.1*  HGB 12.4 13.2  HCT 38.1 41.7  PLT 255 260   BMET Recent Labs    04/17/20 0140 04/18/20 0456  NA 139 140  139  K 3.4* 3.8  3.8  CL 89* 92*  91*  CO2 33* 38*  36*  GLUCOSE 93 175*  173*  BUN 15 19  18   CREATININE 0.83 0.70  0.76  CALCIUM 8.1* 8.3*  8.3*   PT/INR No results for input(s): LABPROT, INR in the last 72 hours. CMP     Component Value Date/Time   NA 140 04/18/2020 0456   NA 139 04/18/2020 0456   NA 141 01/15/2018 0000   NA 138 08/07/2017 0835   K 3.8 04/18/2020 0456   K 3.8 04/18/2020 0456   K 4.1 08/07/2017 0835   CL 92 (L) 04/18/2020 0456   CL 91 (L) 04/18/2020 0456   CO2 38 (H) 04/18/2020 0456   CO2 36 (H) 04/18/2020 0456   CO2 25 08/07/2017 0835   GLUCOSE 175 (H) 04/18/2020 0456   GLUCOSE 173 (H) 04/18/2020 0456   GLUCOSE 95 08/07/2017 0835   BUN 19 04/18/2020 0456   BUN 18 04/18/2020 0456    BUN 4 01/15/2018 0000   BUN 9.3 08/07/2017 0835   CREATININE 0.70 04/18/2020 0456   CREATININE 0.76 04/18/2020 0456   CREATININE 0.80 03/16/2020 1002   CREATININE 0.57 02/05/2018 1144   CREATININE 0.7 08/07/2017 0835   CALCIUM 8.3 (L) 04/18/2020 0456   CALCIUM 8.3 (L) 04/18/2020 0456   CALCIUM 9.4 08/07/2017 0835   PROT 5.8 (L) 04/18/2020 0456   PROT 7.3 08/07/2017 0835   ALBUMIN 3.0 (L) 04/18/2020 0456   ALBUMIN 2.9 (L) 04/18/2020 0456   ALBUMIN 3.4 (L) 08/07/2017 0835   AST 12 (L) 04/18/2020 0456   AST 13 (L) 03/16/2020 1002   AST 25 08/07/2017 0835   ALT 9 04/18/2020 0456   ALT 11 03/16/2020 1002   ALT 21 08/07/2017 0835   ALKPHOS 58 04/18/2020 0456   ALKPHOS 138 08/07/2017 0835   BILITOT 0.6 04/18/2020 0456   BILITOT 0.4 03/16/2020 1002   BILITOT 0.49 08/07/2017 0835   GFRNONAA >60 04/18/2020 0456   GFRNONAA >60 04/18/2020 0456   GFRNONAA >60 03/16/2020 1002   GFRAA >60 04/18/2020 0456   GFRAA >60 04/18/2020 0456   GFRAA >60 03/16/2020 1002  Lipase     Component Value Date/Time   LIPASE 59 (H) 04/11/2020 1542       Studies/Results: DG Abd Portable 1V  Result Date: 04/18/2020 CLINICAL DATA:  Small bowel obstruction. EXAM: PORTABLE ABDOMEN - 1 VIEW COMPARISON:  One-view abdomen 04/17/20 FINDINGS: Dilated loops of small bowel remain, slightly less conspicuous than on the prior exam. Wall thickening is improved. Oral contrast is present colon. NG tube is in place. IMPRESSION: 1. Improving small bowel obstruction. 2. Oral contrast is present in the colon. Electronically Signed   By: San Morelle M.D.   On: 04/18/2020 07:48   DG Abd Portable 1V  Result Date: 04/17/2020 CLINICAL DATA:  Small-bowel obstruction. EXAM: PORTABLE ABDOMEN - 1 VIEW COMPARISON:  04/15/2020 FINDINGS: Mild decrease in caliber of multiple loops of dilated jejunum. Interval mucosal thickening involving those dilated loops. Gas and dilute oral contrast in normal caliber colon. Nasogastric  tube tip and side hole in the proximal stomach. Lumbar spine degenerative changes and mild scoliosis. IMPRESSION: Mild improvement in the pattern of partial small bowel obstruction with interval evidence of mucosal thickening involving the dilated loops of jejunum without pneumatosis. This could be due to infectious, inflammatory or ischemic enteritis. Electronically Signed   By: Claudie Revering M.D.   On: 04/17/2020 08:03    Anti-infectives: Anti-infectives (From admission, onward)   None       Assessment/Plan Chronic tobacco use Abdominal wall thrombus-on Xarelto Hx EtOH abuse AKI Malnutrition - prealbumin 14.7 (4/16), on TNA  Metastatic colon cancerwith right hemicolectomy 2016 - ongoing chemotherapy, followed by Dr. Benay Spice -Excision of malignant mass from the abdominal including skin, muscle, fascia and peritoneum(15 x 12cm), abdominal reconstruction with [name], 10/03/17 Dr. Barry Dienes and [name]   SBO -Ng output still high but may be partly ice. Films somewhat improved, but still with some dilated SB, but with contrast and air in the colon too. -continues to have obstructive symptoms despite film appearance -discussion with patient about how high risk she is for surgery given she has no "abdominal wall" and what problems that can present.  She understands but acknowledges that her only other option is to give up which she does not want to do yet. -will discuss with Dr. Marlou Starks regarding plans to proceed with surgery as she has been here with conservative management for the past week.  FEN - NPO/NGT/IVFs VTE - Lovenox ID - none currently   LOS: 7 days    Henreitta Cea , [facility] 04/18/2020, 9:47 AM Please see Amion for pager number during day hours 7:00am-4:30pm or 7:00am -11:30am on weekends

## 2020-04-18 NOTE — Progress Notes (Signed)
Patient ID: Yolanda Watson, female   DOB: Aug 24, 1951, 69 y.o.   MRN: [id number]       Subjective: Still with not much change.  Her pain is better today, but she is not passing flatus or having any BMs.    ROS: See above, otherwise other systems negative  Objective: Vital signs in last 24 hours: Temp:  [97.6 F (36.4 C)-97.8 F (36.6 C)] 97.8 F (36.6 C) (04/20 0602) Pulse Rate:  [83-90] 86 (04/20 0602) Resp:  [16-18] 16 (04/20 0602) BP: (121-132)/(69-80) 132/69 (04/20 0602) SpO2:  [90 %-93 %] 93 % (04/20 0602) Weight:  [49.5 kg] 49.5 kg (04/19 1143) Last BM Date: 04/13/20  Intake/Output from previous day: 04/19 0701 - 04/20 0700 In: 1824.6 [P.O.:720; I.V.:1054.6; IV Piggyback:50] Out: 3800 [Urine:1300; Emesis/NG output:2500] Intake/Output this shift: Total I/O In: -  Out: 150 [Urine:150]  PE: Abd: soft, but more bloated than yesterday.  Still with high NGT output around 2.5L or bilious brown output.  Some BS, still tender, but not as significant as yesterday.  Lab Results:  Recent Labs    04/17/20 0140 04/18/20 0456  WBC 14.9* 11.1*  HGB 12.4 13.2  HCT 38.1 41.7  PLT 255 260   BMET Recent Labs    04/17/20 0140 04/18/20 0456  NA 139 140  139  K 3.4* 3.8  3.8  CL 89* 92*  91*  CO2 33* 38*  36*  GLUCOSE 93 175*  173*  BUN 15 19  18   CREATININE 0.83 0.70  0.76  CALCIUM 8.1* 8.3*  8.3*   PT/INR No results for input(s): LABPROT, INR in the last 72 hours. CMP     Component Value Date/Time   NA 140 04/18/2020 0456   NA 139 04/18/2020 0456   NA 141 01/15/2018 0000   NA 138 08/07/2017 0835   K 3.8 04/18/2020 0456   K 3.8 04/18/2020 0456   K 4.1 08/07/2017 0835   CL 92 (L) 04/18/2020 0456   CL 91 (L) 04/18/2020 0456   CO2 38 (H) 04/18/2020 0456   CO2 36 (H) 04/18/2020 0456   CO2 25 08/07/2017 0835   GLUCOSE 175 (H) 04/18/2020 0456   GLUCOSE 173 (H) 04/18/2020 0456   GLUCOSE 95 08/07/2017 0835   BUN 19 04/18/2020 0456   BUN 18 04/18/2020 0456     BUN 4 01/15/2018 0000   BUN 9.3 08/07/2017 0835   CREATININE 0.70 04/18/2020 0456   CREATININE 0.76 04/18/2020 0456   CREATININE 0.80 03/16/2020 1002   CREATININE 0.57 02/05/2018 1144   CREATININE 0.7 08/07/2017 0835   CALCIUM 8.3 (L) 04/18/2020 0456   CALCIUM 8.3 (L) 04/18/2020 0456   CALCIUM 9.4 08/07/2017 0835   PROT 5.8 (L) 04/18/2020 0456   PROT 7.3 08/07/2017 0835   ALBUMIN 3.0 (L) 04/18/2020 0456   ALBUMIN 2.9 (L) 04/18/2020 0456   ALBUMIN 3.4 (L) 08/07/2017 0835   AST 12 (L) 04/18/2020 0456   AST 13 (L) 03/16/2020 1002   AST 25 08/07/2017 0835   ALT 9 04/18/2020 0456   ALT 11 03/16/2020 1002   ALT 21 08/07/2017 0835   ALKPHOS 58 04/18/2020 0456   ALKPHOS 138 08/07/2017 0835   BILITOT 0.6 04/18/2020 0456   BILITOT 0.4 03/16/2020 1002   BILITOT 0.49 08/07/2017 0835   GFRNONAA >60 04/18/2020 0456   GFRNONAA >60 04/18/2020 0456   GFRNONAA >60 03/16/2020 1002   GFRAA >60 04/18/2020 0456   GFRAA >60 04/18/2020 0456   GFRAA >60 03/16/2020  1002   Lipase     Component Value Date/Time   LIPASE 59 (H) 04/11/2020 1542       Studies/Results: DG Abd Portable 1V  Result Date: 04/18/2020 CLINICAL DATA:  Small bowel obstruction. EXAM: PORTABLE ABDOMEN - 1 VIEW COMPARISON:  One-view abdomen 04/17/20 FINDINGS: Dilated loops of small bowel remain, slightly less conspicuous than on the prior exam. Wall thickening is improved. Oral contrast is present colon. NG tube is in place. IMPRESSION: 1. Improving small bowel obstruction. 2. Oral contrast is present in the colon. Electronically Signed   By: San Morelle M.D.   On: 04/18/2020 07:48   DG Abd Portable 1V  Result Date: 04/17/2020 CLINICAL DATA:  Small-bowel obstruction. EXAM: PORTABLE ABDOMEN - 1 VIEW COMPARISON:  04/15/2020 FINDINGS: Mild decrease in caliber of multiple loops of dilated jejunum. Interval mucosal thickening involving those dilated loops. Gas and dilute oral contrast in normal caliber colon. Nasogastric  tube tip and side hole in the proximal stomach. Lumbar spine degenerative changes and mild scoliosis. IMPRESSION: Mild improvement in the pattern of partial small bowel obstruction with interval evidence of mucosal thickening involving the dilated loops of jejunum without pneumatosis. This could be due to infectious, inflammatory or ischemic enteritis. Electronically Signed   By: Claudie Revering M.D.   On: 04/17/2020 08:03    Anti-infectives: Anti-infectives (From admission, onward)   None       Assessment/Plan Chronic tobacco use Abdominal wall thrombus-on Xarelto Hx EtOH abuse AKI Malnutrition - prealbumin 14.7 (4/16), on TNA  Metastatic colon cancerwith right hemicolectomy 2016 - ongoing chemotherapy, followed by Dr. Benay Spice -Excision of malignant mass from the abdominal including skin, muscle, fascia and peritoneum(15 x 12cm), abdominal reconstruction with [name], 10/03/17 Dr. Barry Dienes and [name]   SBO -Ng output still high but may be partly ice. Films somewhat improved, but still with some dilated SB, but with contrast and air in the colon too. -continues to have obstructive symptoms despite film appearance -discussion with patient about how high risk she is for surgery given she has no "abdominal wall" and what problems that can present.  She understands but acknowledges that her only other option is to give up which she does not want to do yet. -will discuss with Dr. Marlou Starks regarding plans to proceed with surgery as she has been here with conservative management for the past week.  FEN - NPO/NGT/IVFs VTE - Lovenox ID - none currently   LOS: 7 days    Henreitta Cea , [facility] 04/18/2020, 9:47 AM Please see Amion for pager number during day hours 7:00am-4:30pm or 7:00am -11:30am on weekends

## 2020-04-18 NOTE — Progress Notes (Signed)
Initial Nutrition Assessment  DOCUMENTATION CODES:   Underweight  INTERVENTION:  - TPN advancement and management per Pharmacist. - will monitor for diet advancement.   Monitor magnesium, potassium, and phosphorus daily for at least 3 days, MD to replete as needed, as pt is at risk for refeeding syndrome given underweight BMI, no nutrition x1 week.   NUTRITION DIAGNOSIS:   Inadequate oral intake related to inability to eat as evidenced by NPO status.  GOAL:   Patient will meet greater than or equal to 90% of their needs  MONITOR:   Diet advancement, Labs, Weight trends, I & O's, Other (Comment)(TPN regimen)  REASON FOR ASSESSMENT:   Consult New TPN/TNA  ASSESSMENT:   69 y.o. 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 Patient presented 2/2 to abdominal pain and found to have a SBO.  She has been NPO since admission on 4/13. NGT placed in R nare on that date and 300 ml output this shift. Patient laying in bed with no family/visitors present. After RD introduced self, patient became tearful, requested that RD leave the room, and was unable to talk d/t emotional distress.   Per chart review, weight yesterday was 109 lb and weight on admission (4/13) was 104 lb. Weight has been fairly stable over the past 7 months.   She is currently receiving custom TPN @ 30 ml/hr with plan to advance to 60 ml/hr today at 1800. This increased rate will provide 1466 kcal (84% estimated kcal need) and 75 grams protein (88% estimated protein need).   Per notes: - partial SBO--TPN started via R chest port on 4/19, non-surgical management at this time - concern for refeeding - AKI - metabolic alkalosis--d/t GI losses; worsening - metastatic colon cancer - pancreatic duct dilation--no mass or lesion, asymptomatic, lipase minimally elevated - abdominal wall thrombus   Labs reviewed; CBG: 162  mg/dl, Cl: 92 mmol/l, Ca: 8.3 mg/dl. Medications reviewed; sliding scale novolog, 40 mEq oral KCl x1 dose 4/19, 20 mEq Klor-Con x1 dose 4/20. IVF; LR @ 100 ml/hr to decrease to 40 ml/hr today at 1800.     NUTRITION - FOCUSED PHYSICAL EXAM:  unable to complete at this time.   Diet Order:   Diet Order            Diet NPO time specified Except for: Ice Chips, Other (See Comments)  Diet effective now              EDUCATION NEEDS:   Not appropriate for education at this time  Skin:  Skin Assessment: Reviewed RN Assessment  Last BM:  4/15  Height:   Ht Readings from Last 1 Encounters:  04/12/20 5\' 6"  (1.676 m)    Weight:   Wt Readings from Last 1 Encounters:  04/17/20 49.5 kg    Ideal Body Weight:  59.1 kg  BMI:  Body mass index is 17.61 kg/m.  Estimated Nutritional Needs:   Kcal:  1735-1900 kcal  Protein:  85-95 grams  Fluid:  >/= 1.8 L/day     Jarome Matin, MS, RD, LDN, CNSC Inpatient Clinical Dietitian RD pager # available in AMION  After hours/weekend pager # available in [facility]

## 2020-04-18 NOTE — Progress Notes (Signed)
PROGRESS NOTE    Yolanda Watson  [id number] DOB: [birth year] DOA: 04/11/2020 PCP: Patient, No Pcp Per   Brief Narrative: Yolanda Watson is a 69 y.o. 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 Patient presented secondary to abdominal pain and found to have an SBO.   Assessment & Plan:   Principal Problem:   SBO (small bowel obstruction) (HCC) Active Problems:   Abdominal pain   Nausea & vomiting   Acute hyponatremia   AKI (acute kidney injury) ([place])   Leukocytosis   Partial small bowel obstruction CT scan significant for SBO with transition point located in the midabdomen. History of multiple intraabdominal surgeries. NG tube placed and on intermittent suction. NG output of 2.5 L in the last 24 hours.Patient's pain and distension are significantly worsened. -General surgery recommendations: pending today -TPN (started 4/19 via right chest port) -Watch for refeeding syndrome, daily potassium, phosphorus, magnesium  Hyponatremia Likely secondary to some mild hypovolemia. Resolved with IV fluids.  Hypokalemia -Replete as needed  AKI Baseline creatinine of 0.8. Creatinine of 1.68 on admission. BUN significantly elevated and now improved. Resolved. -Continue LR fluids  Metabolic alkalosis Secondary to GI losses. Associated low chloride and elevated anion gap. Continues to worsen in setting of continued NG tube.  Leukocytosis In setting of SBO. Improved and now stabilized. Still slightly elevated. -Trend CBC  Metastatic colon cancer Patient is currently on chemotherapy. Next cycle planned for about one week.  Pancreatic duct dilation Incidental finding on CT abdomen/pelvis. No mass lesion noted. Patient is currently asymptomatic. Lipase minimally elevated. Will need outpatient monitoring.  Abdominal wall thrombus On Xarelto as an outpatient. Held on admission -VTE  prophylaxis with Lovenox   DVT prophylaxis: Lovenox Code Status:   Code Status: DNR Family Communication: Husband at bedside Disposition Plan: Discharge pending continued general surgery recommendations   Consultants:   General surgery  Procedures:   NG tube (4/13>>  TPN (4/19>>  Antimicrobials:  None    Subjective: Significantly worsened pain and distension. Minimal flatus. No bowel movement.  Objective: Vitals:   04/17/20 1143 04/17/20 1419 04/17/20 2112 04/18/20 0602  BP:  121/72 122/80 132/69  Pulse:  90 83 86  Resp:  18 16 16   Temp:  97.6 F (36.4 C) 97.8 F (36.6 C) 97.8 F (36.6 C)  TempSrc:  Oral Oral Oral  SpO2:  93% 90% 93%  Weight: 49.5 kg     Height:        Intake/Output Summary (Last 24 hours) at 04/18/2020 0943 Last data filed at 04/18/2020 0852 Gross per 24 hour  Intake 1824.59 ml  Output 3950 ml  Net -2125.41 ml   Filed Weights   04/16/20 0436 04/17/20 0448 04/17/20 1143  Weight: 49.5 kg 49.7 kg 49.5 kg    Examination:  General exam: Appears calm and comfortable Respiratory system: Clear to auscultation. Respiratory effort normal. Cardiovascular system: S1 & S2 heard, RRR. No murmurs, rubs, gallops or clicks. Gastrointestinal system: Abdomen is distended, soft and significantly tender to the lightest of palpation. Normal bowel sounds heard. Central nervous system: Alert and oriented. No focal neurological deficits. Extremities: No edema. No calf tenderness Skin: No cyanosis. No rashes Psychiatry: Judgement and insight appear normal. Mood & affect appropriate.      Data Reviewed: I have personally reviewed following labs and imaging studies  CBC: Recent Labs  Lab 04/11/20 1542 04/11/20 1542 04/12/20 0221 04/12/20 0221 04/14/20 0431 04/15/20 0303  04/16/20 0138 04/17/20 0140 04/18/20 0456  WBC 20.8*   < > 19.3*   < > 18.6* 18.7* 14.9* 14.9* 11.1*  NEUTROABS 18.0*  --  15.5*  --   --   --   --   --  9.0*  HGB 16.3*   < >  14.7   < > 12.6 13.1 12.2 12.4 13.2  HCT 46.2*   < > 43.0   < > 39.1 40.3 37.9 38.1 41.7  MCV 97.1   < > 100.0   < > 105.7* 104.9* 105.9* 107.3* 106.4*  PLT 211   < > 250   < > 219 252 242 255 260   < > = values in this interval not displayed.   Basic Metabolic Panel: Recent Labs  Lab 04/11/20 1542 04/11/20 1542 04/12/20 0221 04/13/20 0456 04/14/20 0431 04/15/20 0303 04/16/20 0138 04/16/20 1116 04/17/20 0140 04/18/20 0456  NA 129*   < > 133*   < > 136 138 139  --  139 140  139  K 3.6   < > 3.1*   < > 3.2* 3.6 3.0*  --  3.4* 3.8  3.8  CL 81*   < > 85*   < > 88* 91* 89*  --  89* 92*  91*  CO2 28   < > 32   < > 32 31 33*  --  33* 38*  36*  GLUCOSE 154*   < > 124*   < > 82 92 85  --  93 175*  173*  BUN 87*   < > 87*   < > 36* 25* 18  --  15 19  18   CREATININE 1.68*   < > 1.62*   < > 0.77 0.85 0.76  --  0.83 0.70  0.76  CALCIUM 8.8*   < > 8.5*   < > 8.6* 8.5* 8.1*  --  8.1* 8.3*  8.3*  MG 2.1  --  2.2  --   --   --   --  1.8 1.6* 2.4  PHOS  --   --   --   --   --   --   --   --  2.5 2.6  2.5   < > = values in this interval not displayed.   GFR: Estimated Creatinine Clearance: 53.3 mL/min (by C-G formula based on SCr of 0.7 mg/dL). Liver Function Tests: Recent Labs  Lab 04/11/20 1542 04/12/20 0221 04/17/20 0140 04/18/20 0456  AST 18 16  --  12*  ALT 13 12  --  9  ALKPHOS 93 93  --  58  BILITOT 1.0 1.1  --  0.6  PROT 7.7 7.4  --  5.8*  ALBUMIN 4.9 4.6 3.0* 3.0*  2.9*   Recent Labs  Lab 04/11/20 1542  LIPASE 59*   No results for input(s): AMMONIA in the last 168 hours. Coagulation Profile: Recent Labs  Lab 04/12/20 0221  INR 0.9   Cardiac Enzymes: No results for input(s): CKTOTAL, CKMB, CKMBINDEX, TROPONINI in the last 168 hours. BNP (last 3 results) No results for input(s): PROBNP in the last 8760 hours. HbA1C: No results for input(s): HGBA1C in the last 72 hours. CBG: Recent Labs  Lab 04/17/20 1746 04/17/20 2337 04/18/20 0605  GLUCAP 137*  162* 162*   Lipid Profile: Recent Labs    04/18/20 0456  TRIG 99   Thyroid Function Tests: No results for input(s): TSH, T4TOTAL, FREET4, T3FREE, THYROIDAB in the last 72 hours.  Anemia Panel: No results for input(s): VITAMINB12, FOLATE, FERRITIN, TIBC, IRON, RETICCTPCT in the last 72 hours. Sepsis Labs: No results for input(s): PROCALCITON, LATICACIDVEN in the last 168 hours.  Recent Results (from the past 240 hour(s))  Respiratory Panel by RT PCR (Flu A&B, Covid) - Nasopharyngeal Swab     Status: None   Collection Time: 04/11/20  6:49 PM   Specimen: Nasopharyngeal Swab  Result Value Ref Range Status   SARS Coronavirus 2 by RT PCR NEGATIVE NEGATIVE Final    Comment: (NOTE) SARS-CoV-2 target nucleic acids are NOT DETECTED. The SARS-CoV-2 RNA is generally detectable in upper respiratoy specimens during the acute phase of infection. The lowest concentration of SARS-CoV-2 viral copies this assay can detect is 131 copies/mL. A negative result does not preclude SARS-Cov-2 infection and should not be used as the sole basis for treatment or other patient management decisions. A negative result may occur with  improper specimen collection/handling, submission of specimen other than nasopharyngeal swab, presence of viral mutation(s) within the areas targeted by this assay, and inadequate number of viral copies (<131 copies/mL). A negative result must be combined with clinical observations, patient history, and epidemiological information. The expected result is Negative. Fact Sheet for Patients:  PinkCheek.be Fact Sheet for Healthcare Providers:  GravelBags.it This test is not yet ap proved or cleared by the [place] FDA and  has been authorized for detection and/or diagnosis of SARS-CoV-2 by FDA under an Emergency Use Authorization (EUA). This EUA will remain  in effect (meaning this test can be used) for the duration of  the COVID-19 declaration under Section 564(b)(1) of the Act, 21 U.S.C. section 360bbb-3(b)(1), unless the authorization is terminated or revoked sooner.    Influenza A by PCR NEGATIVE NEGATIVE Final   Influenza B by PCR NEGATIVE NEGATIVE Final    Comment: (NOTE) The Xpert Xpress SARS-CoV-2/FLU/RSV assay is intended as an aid in  the diagnosis of influenza from Nasopharyngeal swab specimens and  should not be used as a sole basis for treatment. Nasal washings and  aspirates are unacceptable for Xpert Xpress SARS-CoV-2/FLU/RSV  testing. Fact Sheet for Patients: PinkCheek.be Fact Sheet for Healthcare Providers: GravelBags.it This test is not yet approved or cleared by the [place] FDA and  has been authorized for detection and/or diagnosis of SARS-CoV-2 by  FDA under an Emergency Use Authorization (EUA). This EUA will remain  in effect (meaning this test can be used) for the duration of the  Covid-19 declaration under Section 564(b)(1) of the Act, 21  U.S.C. section 360bbb-3(b)(1), unless the authorization is  terminated or revoked. Performed at [facility], [place] [address] [place], [place] [address]          Radiology Studies: DG Abd Portable 1V  Result Date: 04/18/2020 CLINICAL DATA:  Small bowel obstruction. EXAM: PORTABLE ABDOMEN - 1 VIEW COMPARISON:  One-view abdomen 04/17/20 FINDINGS: Dilated loops of small bowel remain, slightly less conspicuous than on the prior exam. Wall thickening is improved. Oral contrast is present colon. NG tube is in place. IMPRESSION: 1. Improving small bowel obstruction. 2. Oral contrast is present in the colon. Electronically Signed   By: San Morelle M.D.   On: 04/18/2020 07:48   DG Abd Portable 1V  Result Date: 04/17/2020 CLINICAL DATA:  Small-bowel obstruction. EXAM: PORTABLE ABDOMEN - 1 VIEW COMPARISON:  04/15/2020 FINDINGS: Mild decrease in caliber of  multiple loops of dilated jejunum. Interval mucosal thickening involving those dilated loops. Gas and dilute oral contrast in  normal caliber colon. Nasogastric tube tip and side hole in the proximal stomach. Lumbar spine degenerative changes and mild scoliosis. IMPRESSION: Mild improvement in the pattern of partial small bowel obstruction with interval evidence of mucosal thickening involving the dilated loops of jejunum without pneumatosis. This could be due to infectious, inflammatory or ischemic enteritis. Electronically Signed   By: Claudie Revering M.D.   On: 04/17/2020 08:03        Scheduled Meds: . Chlorhexidine Gluconate Cloth  6 each Topical Daily  . enoxaparin (LOVENOX) injection  40 mg Subcutaneous Q24H  . insulin aspart  0-9 Units Subcutaneous Q6H  . potassium chloride  20 mEq Oral Once  . sodium chloride flush  3 mL Intravenous Q12H   Continuous Infusions: . lactated ringers 100 mL/hr at 04/18/20 0006  . lactated ringers    . TPN ADULT (ION) 30 mL/hr at 04/17/20 1844  . TPN ADULT (ION)       LOS: 7 days     Cordelia Poche, MD [facility] 04/18/2020, 9:43 AM  If 7PM-7AM, please contact night-coverage [email or web address]

## 2020-04-18 NOTE — Progress Notes (Addendum)
PHARMACY - TOTAL PARENTERAL NUTRITION CONSULT NOTE   Indication: small bowel obstruction  Patient Measurements: Height: 5\' 6"  (167.6 cm) Weight: 49.5 kg (109 lb 2 oz) IBW/kg (Calculated) : 59.3   Body mass index is 17.61 kg/m.  Assessment: 63 y/oF with PMH of metastatic colon cancer, abdominal wall thrombus who presented on 04/11/2020 secondary to abdominal pain and found to have SBO. Pharmacy consulted for TPN.   Glucose / Insulin: No hx of DM. CBGs 162-175 on TPN at 30 ml/hr, 5 U SSI/24 hrs  Electrolytes:  Lytes WNL, K 3.8 after 40 Kcl yest, Mag 2.4 after 2 gm yesterday  Chloride low at 92, CO2 elevated at 38.  Renal: SCr/BUN WNL LFTs / TGs: LFTs WNL (4/14)/Triglycerides 99 (4/20) Prealbumin / albumin: 14.7 (4/16), < 5 (4/20)  Intake / Output; MIVF: _ 1975 mL (urine: 1319mL; Emesis/NG output: 2543mL); LR at 100 mL/hr GI Imaging: 4/13 CT abd/pelvis significant for SBO with transition point in the mid abdomen. 4/19 AXR: Mild improvement in the pattern of partial small bowel obstruction with interval evidence of mucosal thickening involving the dilated loops of jejunum without pneumatosis.  Surgeries / Procedures:   Central access: port  TPN start date: 04/17/2020  Nutritional Goals  RD recommendations pending   Current Nutrition:  NPO, ice chips, TPN  Plan:  Now: 20 Kdur po ( RN to clamp NGT after- tolerated yesterday, poor IV access for IV KCL)  -At 1800: Increase TPN to goal rate of 71mL/hr to provide ~ 75 gm protein and ~ 1466 Kcals  Electrolytes in TPN: 28mEq/L of Na, 35mEq/L of K, 61mEq/L of Ca, 39mEq/L of Mag, and 34mmol/L of Phos. Cl:Ac ratio 2:1 Add standard MVI and trace elements to TPN.  continue Sensitive q6h SSI and adjust as needed.   Decrease IVF from 100 to 40 ml/hr when TPN rate up to 60 ml/hr or per MD.  Monitor TPN labs on Mon/Thurs. BMET, Mag, Phos,in AM.  Monitor for refeeding syndrome.   Eudelia Bunch, Pharm.D [phone] 04/18/2020 7:42 AM

## 2020-04-19 ENCOUNTER — Inpatient Hospital Stay (HOSPITAL_COMMUNITY): Payer: Medicare Other | Admitting: Certified Registered Nurse Anesthetist

## 2020-04-19 ENCOUNTER — Encounter (HOSPITAL_COMMUNITY)
Admission: EM | Disposition: A | Discharge status: Home Health | Payer: Self-pay | Source: Home / Self Care | Attending: Family Medicine

## 2020-04-19 ENCOUNTER — Encounter (HOSPITAL_COMMUNITY): Payer: Self-pay | Admitting: Internal Medicine

## 2020-04-19 DIAGNOSIS — E871 Hypo-osmolality and hyponatremia: Secondary | ICD-10-CM | POA: Diagnosis not present

## 2020-04-19 DIAGNOSIS — K565 Intestinal adhesions [bands], unspecified as to partial versus complete obstruction: Secondary | ICD-10-CM | POA: Diagnosis not present

## 2020-04-19 DIAGNOSIS — N179 Acute kidney failure, unspecified: Secondary | ICD-10-CM | POA: Diagnosis not present

## 2020-04-19 DIAGNOSIS — C189 Malignant neoplasm of colon, unspecified: Secondary | ICD-10-CM | POA: Diagnosis not present

## 2020-04-19 DIAGNOSIS — C787 Secondary malignant neoplasm of liver and intrahepatic bile duct: Secondary | ICD-10-CM | POA: Diagnosis not present

## 2020-04-19 DIAGNOSIS — D72829 Elevated white blood cell count, unspecified: Secondary | ICD-10-CM | POA: Diagnosis not present

## 2020-04-19 DIAGNOSIS — K56609 Unspecified intestinal obstruction, unspecified as to partial versus complete obstruction: Secondary | ICD-10-CM | POA: Diagnosis not present

## 2020-04-19 DIAGNOSIS — R1084 Generalized abdominal pain: Secondary | ICD-10-CM | POA: Diagnosis not present

## 2020-04-19 HISTORY — PX: BOWEL RESECTION: SHX1257

## 2020-04-19 HISTORY — PX: LYSIS OF ADHESION: SHX5961

## 2020-04-19 HISTORY — PX: APPLICATION OF WOUND VAC: SHX5189

## 2020-04-19 HISTORY — PX: LAPAROTOMY: SHX154

## 2020-04-19 LAB — RENAL FUNCTION PANEL
Albumin: 2.5 g/dL — ABNORMAL LOW (ref 3.5–5.0)
Anion gap: 12 (ref 5–15)
BUN: 22 mg/dL (ref 8–23)
CO2: 29 mmol/L (ref 22–32)
Calcium: 8.1 mg/dL — ABNORMAL LOW (ref 8.9–10.3)
Chloride: 97 mmol/L — ABNORMAL LOW (ref 98–111)
Creatinine, Ser: 0.65 mg/dL (ref 0.44–1.00)
GFR calc Af Amer: >60 mL/min (ref >60)
GFR calc non Af Amer: >60 mL/min (ref >60)
Glucose, Bld: 163 mg/dL — ABNORMAL HIGH (ref 70–99)
Phosphorus: 2 mg/dL — ABNORMAL LOW (ref 2.5–4.6)
Potassium: 3.4 mmol/L — ABNORMAL LOW (ref 3.5–5.1)
Sodium: 138 mmol/L (ref 135–145)

## 2020-04-19 LAB — BASIC METABOLIC PANEL
Anion gap: 11 (ref 5–15)
BUN: 23 mg/dL (ref 8–23)
CO2: 30 mmol/L (ref 22–32)
Calcium: 8.1 mg/dL — ABNORMAL LOW (ref 8.9–10.3)
Chloride: 96 mmol/L — ABNORMAL LOW (ref 98–111)
Creatinine, Ser: 0.62 mg/dL (ref 0.44–1.00)
GFR calc Af Amer: >60 mL/min (ref >60)
GFR calc non Af Amer: >60 mL/min (ref >60)
Glucose, Bld: 164 mg/dL — ABNORMAL HIGH (ref 70–99)
Potassium: 3.4 mmol/L — ABNORMAL LOW (ref 3.5–5.1)
Sodium: 137 mmol/L (ref 135–145)

## 2020-04-19 LAB — PHOSPHORUS: Phosphorus: 2 mg/dL — ABNORMAL LOW (ref 2.5–4.6)

## 2020-04-19 LAB — GLUCOSE, CAPILLARY
Glucose-Capillary: 113 mg/dL — ABNORMAL HIGH (ref 70–99)
Glucose-Capillary: 144 mg/dL — ABNORMAL HIGH (ref 70–99)
Glucose-Capillary: 164 mg/dL — ABNORMAL HIGH (ref 70–99)
Glucose-Capillary: 247 mg/dL — ABNORMAL HIGH (ref 70–99)
Glucose-Capillary: 258 mg/dL — ABNORMAL HIGH (ref 70–99)

## 2020-04-19 LAB — MAGNESIUM: Magnesium: 1.8 mg/dL (ref 1.7–2.4)

## 2020-04-19 LAB — SURGICAL PCR SCREEN
MRSA, PCR: NEGATIVE
Staphylococcus aureus: NEGATIVE

## 2020-04-19 SURGERY — LAPAROTOMY, EXPLORATORY
Anesthesia: General | Site: Abdomen

## 2020-04-19 MED ORDER — MORPHINE SULFATE (PF) 2 MG/ML IV SOLN
1.0000 mg | INTRAVENOUS | Status: DC | PRN
  Administered 2020-04-19: 2 mg via INTRAVENOUS
  Administered 2020-04-19 – 2020-04-20 (×9): 4 mg via INTRAVENOUS
  Filled 2020-04-19: qty 1
  Filled 2020-04-19 (×10): qty 2

## 2020-04-19 MED ORDER — FENTANYL CITRATE (PF) 100 MCG/2ML IJ SOLN
INTRAMUSCULAR | Status: AC
  Filled 2020-04-19: qty 2

## 2020-04-19 MED ORDER — SUCCINYLCHOLINE CHLORIDE 200 MG/10ML IV SOSY
PREFILLED_SYRINGE | INTRAVENOUS | Status: DC | PRN
  Administered 2020-04-19: 120 mg via INTRAVENOUS

## 2020-04-19 MED ORDER — HYDROMORPHONE HCL 1 MG/ML IJ SOLN
0.5000 mg | INTRAMUSCULAR | Status: DC | PRN
  Administered 2020-04-19 (×2): 0.5 mg via INTRAVENOUS

## 2020-04-19 MED ORDER — OXYCODONE HCL 5 MG/5ML PO SOLN: 5.0000 mg | Freq: Once | ORAL | Status: DC | PRN

## 2020-04-19 MED ORDER — POTASSIUM CHLORIDE 10 MEQ/50ML IV SOLN
10.0000 meq | INTRAVENOUS | Status: DC
  Administered 2020-04-19: 10 meq via INTRAVENOUS

## 2020-04-19 MED ORDER — SUCCINYLCHOLINE CHLORIDE 200 MG/10ML IV SOSY
PREFILLED_SYRINGE | INTRAVENOUS | Status: AC
  Filled 2020-04-19: qty 10

## 2020-04-19 MED ORDER — TRAVASOL 10 % IV SOLN
INTRAVENOUS | Status: AC
  Filled 2020-04-19: qty 748.8

## 2020-04-19 MED ORDER — PHENYLEPHRINE HCL-NACL 10-0.9 MG/250ML-% IV SOLN
INTRAVENOUS | Status: DC | PRN
  Administered 2020-04-19: 40 ug/min via INTRAVENOUS

## 2020-04-19 MED ORDER — PROPOFOL 10 MG/ML IV BOLUS
INTRAVENOUS | Status: DC | PRN
  Administered 2020-04-19: 100 mg via INTRAVENOUS

## 2020-04-19 MED ORDER — PHENYLEPHRINE HCL (PRESSORS) 10 MG/ML IV SOLN
INTRAVENOUS | Status: AC
  Filled 2020-04-19: qty 1

## 2020-04-19 MED ORDER — ALBUMIN HUMAN 5 % IV SOLN: INTRAVENOUS | Status: DC | PRN

## 2020-04-19 MED ORDER — POTASSIUM CHLORIDE 10 MEQ/50ML IV SOLN
10.0000 meq | INTRAVENOUS | Status: DC
  Filled 2020-04-19 (×2): qty 50

## 2020-04-19 MED ORDER — ACETAMINOPHEN 10 MG/ML IV SOLN
1000.0000 mg | Freq: Four times a day (QID) | INTRAVENOUS | Status: AC
  Administered 2020-04-19 – 2020-04-20 (×4): 1000 mg via INTRAVENOUS
  Filled 2020-04-19 (×4): qty 100

## 2020-04-19 MED ORDER — ONDANSETRON HCL 4 MG/2ML IJ SOLN
INTRAMUSCULAR | Status: AC
  Filled 2020-04-19: qty 2

## 2020-04-19 MED ORDER — ONDANSETRON HCL 4 MG/2ML IJ SOLN
INTRAMUSCULAR | Status: DC | PRN
  Administered 2020-04-19: 4 mg via INTRAVENOUS

## 2020-04-19 MED ORDER — SUGAMMADEX SODIUM 200 MG/2ML IV SOLN
INTRAVENOUS | Status: DC | PRN
  Administered 2020-04-19: 110 mg via INTRAVENOUS

## 2020-04-19 MED ORDER — OXYCODONE HCL 5 MG PO TABS: 5.0000 mg | ORAL_TABLET | Freq: Once | ORAL | Status: DC | PRN

## 2020-04-19 MED ORDER — PROPOFOL 10 MG/ML IV BOLUS
INTRAVENOUS | Status: AC
  Filled 2020-04-19: qty 20

## 2020-04-19 MED ORDER — LIDOCAINE 2% (20 MG/ML) 5 ML SYRINGE
INTRAMUSCULAR | Status: AC
  Filled 2020-04-19: qty 5

## 2020-04-19 MED ORDER — FENTANYL CITRATE (PF) 100 MCG/2ML IJ SOLN
INTRAMUSCULAR | Status: DC | PRN
  Administered 2020-04-19 (×2): 50 ug via INTRAVENOUS
  Administered 2020-04-19: 25 ug via INTRAVENOUS
  Administered 2020-04-19 (×2): 50 ug via INTRAVENOUS
  Administered 2020-04-19: 25 ug via INTRAVENOUS

## 2020-04-19 MED ORDER — MAGNESIUM SULFATE 2 GM/50ML IV SOLN
2.0000 g | Freq: Once | INTRAVENOUS | Status: AC
  Administered 2020-04-19: 2 g via INTRAVENOUS
  Filled 2020-04-19: qty 50

## 2020-04-19 MED ORDER — 0.9 % SODIUM CHLORIDE (POUR BTL) OPTIME
TOPICAL | Status: DC | PRN
  Administered 2020-04-19 (×4): 1000 mL

## 2020-04-19 MED ORDER — PHENYLEPHRINE 40 MCG/ML (10ML) SYRINGE FOR IV PUSH (FOR BLOOD PRESSURE SUPPORT)
PREFILLED_SYRINGE | INTRAVENOUS | Status: DC | PRN
  Administered 2020-04-19: 80 ug via INTRAVENOUS
  Administered 2020-04-19: 120 ug via INTRAVENOUS
  Administered 2020-04-19: 80 ug via INTRAVENOUS

## 2020-04-19 MED ORDER — PHENYLEPHRINE 40 MCG/ML (10ML) SYRINGE FOR IV PUSH (FOR BLOOD PRESSURE SUPPORT)
PREFILLED_SYRINGE | INTRAVENOUS | Status: AC
  Filled 2020-04-19: qty 10

## 2020-04-19 MED ORDER — POTASSIUM PHOSPHATES 15 MMOLE/5ML IV SOLN
15.0000 mmol | Freq: Once | INTRAVENOUS | Status: AC
  Administered 2020-04-19: 15 mmol via INTRAVENOUS
  Filled 2020-04-19: qty 5

## 2020-04-19 MED ORDER — ROCURONIUM BROMIDE 10 MG/ML (PF) SYRINGE
PREFILLED_SYRINGE | INTRAVENOUS | Status: AC
  Filled 2020-04-19: qty 10

## 2020-04-19 MED ORDER — DEXTROSE-NACL 5-0.9 % IV SOLN: INTRAVENOUS | Status: DC

## 2020-04-19 MED ORDER — ROCURONIUM BROMIDE 50 MG/5ML IV SOSY
PREFILLED_SYRINGE | INTRAVENOUS | Status: DC | PRN
  Administered 2020-04-19: 5 mg via INTRAVENOUS
  Administered 2020-04-19: 35 mg via INTRAVENOUS

## 2020-04-19 MED ORDER — LIDOCAINE 2% (20 MG/ML) 5 ML SYRINGE
INTRAMUSCULAR | Status: DC | PRN
  Administered 2020-04-19: 60 mg via INTRAVENOUS

## 2020-04-19 MED ORDER — FENTANYL CITRATE (PF) 250 MCG/5ML IJ SOLN
INTRAMUSCULAR | Status: AC
  Filled 2020-04-19: qty 5

## 2020-04-19 MED ORDER — POTASSIUM CHLORIDE 10 MEQ/100ML IV SOLN
10.0000 meq | Freq: Once | INTRAVENOUS | Status: AC
  Administered 2020-04-19: 10 meq via INTRAVENOUS
  Filled 2020-04-19: qty 100

## 2020-04-19 MED ORDER — HYDROMORPHONE HCL 1 MG/ML IJ SOLN
INTRAMUSCULAR | Status: AC
  Filled 2020-04-19: qty 1

## 2020-04-19 MED ORDER — FENTANYL CITRATE (PF) 100 MCG/2ML IJ SOLN
50.0000 ug | Freq: Once | INTRAMUSCULAR | Status: AC
  Administered 2020-04-19: 50 ug via INTRAVENOUS

## 2020-04-19 MED ORDER — MIDAZOLAM HCL 2 MG/2ML IJ SOLN
INTRAMUSCULAR | Status: AC
  Filled 2020-04-19: qty 2

## 2020-04-19 MED ORDER — DEXAMETHASONE SODIUM PHOSPHATE 10 MG/ML IJ SOLN
INTRAMUSCULAR | Status: DC | PRN
  Administered 2020-04-19: 4 mg via INTRAVENOUS

## 2020-04-19 MED ORDER — ONDANSETRON HCL 4 MG/2ML IJ SOLN: 4.0000 mg | Freq: Once | INTRAMUSCULAR | Status: DC | PRN

## 2020-04-19 MED ORDER — ORAL CARE MOUTH RINSE
15.0000 mL | Freq: Two times a day (BID) | OROMUCOSAL | Status: DC
  Administered 2020-04-19 – 2020-05-03 (×23): 15 mL via OROMUCOSAL

## 2020-04-19 MED ORDER — FENTANYL CITRATE (PF) 100 MCG/2ML IJ SOLN
25.0000 ug | INTRAMUSCULAR | Status: DC | PRN
  Administered 2020-04-19: 50 ug via INTRAVENOUS

## 2020-04-19 SURGICAL SUPPLY — 48 items
APPLICATOR COTTON TIP 6 STRL (MISCELLANEOUS) ×2 IMPLANT
APPLICATOR COTTON TIP 6IN STRL (MISCELLANEOUS) IMPLANT
BLADE EXTENDED COATED 6.5IN (ELECTRODE) ×1 IMPLANT
BLADE HEX COATED 2.75 (ELECTRODE) ×3 IMPLANT
CANISTER WOUND CARE 500ML ATS (WOUND CARE) ×1 IMPLANT
COVER MAYO STAND STRL (DRAPES) ×1 IMPLANT
COVER WAND RF STERILE (DRAPES) ×1 IMPLANT
DRAPE LAPAROSCOPIC ABDOMINAL (DRAPES) ×3 IMPLANT
DRAPE WARM FLUID 44X44 (DRAPES) ×1 IMPLANT
ELECT REM PT RETURN 15FT ADLT (MISCELLANEOUS) ×3 IMPLANT
GAUZE SPONGE 4X4 12PLY STRL (GAUZE/BANDAGES/DRESSINGS) ×2 IMPLANT
GLOVE BIO SURGEON STRL SZ7.5 (GLOVE) ×4 IMPLANT
GLOVE BIOGEL PI IND STRL 7.0 (GLOVE) ×2 IMPLANT
GLOVE BIOGEL PI IND STRL 7.5 (GLOVE) IMPLANT
GLOVE BIOGEL PI INDICATOR 7.0 (GLOVE)
GLOVE BIOGEL PI INDICATOR 7.5 (GLOVE) ×2
GLOVE SURG SS PI 7.5 STRL IVOR (GLOVE) ×2 IMPLANT
GOWN STRL REUS W/TWL LRG LVL3 (GOWN DISPOSABLE) ×4 IMPLANT
GOWN STRL REUS W/TWL XL LVL3 (GOWN DISPOSABLE) ×3 IMPLANT
HANDLE SUCTION POOLE (INSTRUMENTS) IMPLANT
KIT BASIN (CUSTOM PROCEDURE TRAY) ×3 IMPLANT
KIT TURNOVER KIT A (KITS) IMPLANT
LIGASURE IMPACT 36 18CM CVD LR (INSTRUMENTS) ×1 IMPLANT
NS IRRIG 1000ML POUR BTL (IV SOLUTION) ×5 IMPLANT
PACK GENERAL/GYN (CUSTOM PROCEDURE TRAY) ×3 IMPLANT
PENCIL SMOKE EVACUATOR (MISCELLANEOUS) ×1 IMPLANT
RELOAD PROXIMATE 75MM BLUE (ENDOMECHANICALS) ×6 IMPLANT
RELOAD STAPLE 75 3.8 BLU REG (ENDOMECHANICALS) IMPLANT
SPONGE ABDOMINAL VAC ABTHERA (MISCELLANEOUS) ×1 IMPLANT
SPONGE LAP 18X18 RF (DISPOSABLE) IMPLANT
STAPLER GUN LINEAR PROX 60 (STAPLE) ×1 IMPLANT
STAPLER PROXIMATE 75MM BLUE (STAPLE) ×1 IMPLANT
STAPLER VISISTAT 35W (STAPLE) ×3 IMPLANT
SUCTION POOLE HANDLE (INSTRUMENTS) ×3
SUT NOVA NAB DX-16 0-1 5-0 T12 (SUTURE) ×3 IMPLANT
SUT PDS AB 1 CTX 36 (SUTURE) IMPLANT
SUT SILK 2 0 (SUTURE) ×1
SUT SILK 2 0 SH CR/8 (SUTURE) ×2 IMPLANT
SUT SILK 2-0 18XBRD TIE 12 (SUTURE) IMPLANT
SUT SILK 3 0 (SUTURE) ×1
SUT SILK 3 0 SH CR/8 (SUTURE) ×1 IMPLANT
SUT SILK 3-0 18XBRD TIE 12 (SUTURE) IMPLANT
TOWEL OR 17X26 10 PK STRL BLUE (TOWEL DISPOSABLE) ×5 IMPLANT
TRAY FOL W/BAG SLVR 16FR STRL (SET/KITS/TRAYS/PACK) IMPLANT
TRAY FOLEY MTR SLVR 16FR STAT (SET/KITS/TRAYS/PACK) IMPLANT
TRAY FOLEY W/BAG SLVR 16FR LF (SET/KITS/TRAYS/PACK) ×1
WATER STERILE IRR 1000ML POUR (IV SOLUTION) ×2 IMPLANT
YANKAUER SUCT BULB TIP NO VENT (SUCTIONS) ×1 IMPLANT

## 2020-04-19 NOTE — Anesthesia Procedure Notes (Signed)
Procedure Name: Intubation Date/Time: 04/19/2020 12:57 PM Performed by: Silas Sacramento, CRNA Pre-anesthesia Checklist: Patient identified, Emergency Drugs available, Suction available and Patient being monitored Patient Re-evaluated:Patient Re-evaluated prior to induction Oxygen Delivery Method: Circle system utilized Preoxygenation: Pre-oxygenation with 100% oxygen Induction Type: IV induction, Rapid sequence and Cricoid Pressure applied Laryngoscope Size: Mac and 4 Grade View: Grade I Tube type: Oral Tube size: 7.0 mm Number of attempts: 1 Airway Equipment and Method: Stylet Placement Confirmation: ETT inserted through vocal cords under direct vision,  positive ETCO2 and breath sounds checked- equal and bilateral Secured at: 21 cm Tube secured with: Tape Dental Injury: Teeth and Oropharynx as per pre-operative assessment

## 2020-04-19 NOTE — Interval H&P Note (Signed)
History and Physical Interval Note:  04/19/2020 11:37 AM  Yolanda Watson  has presented today for surgery, with the diagnosis of SMALL BOWEL OBSTRUCTION.  The various methods of treatment have been discussed with the patient and family. After consideration of risks, benefits and other options for treatment, the patient has consented to  Procedure(s): EXPLORATORY LAPAROTOMY (N/A) SMALL BOWEL RESECTION (N/A) as a surgical intervention.  The patient's history has been reviewed, patient examined, no change in status, stable for surgery.  I have reviewed the patient's chart and labs.  Questions were answered to the patient's satisfaction.     Autumn Messing III

## 2020-04-19 NOTE — Op Note (Signed)
04/19/2020  2:11 PM  PATIENT:  Yolanda Watson  69 y.o. female  PRE-OPERATIVE DIAGNOSIS:  SMALL BOWEL OBSTRUCTION  POST-OPERATIVE DIAGNOSIS:  SMALL BOWEL OBSTRUCTION WITH PERFORATION  PROCEDURE:  Procedure(s): EXPLORATORY LAPAROTOMY, REPAIR OF MULTIPLE SEROSAL TEARS (N/A) SMALL BOWEL RESECTION (N/A) LYSIS OF ADHESION APPLICATION OF WOUND VAC (N/A)  SURGEON:  Surgeon(s) and Role:    * Jovita Kussmaul, MD - Primary  PHYSICIAN ASSISTANT:   ASSISTANTS: Melina Modena, PA   ANESTHESIA:   general  EBL:  50 mL   BLOOD ADMINISTERED:none  DRAINS: none   LOCAL MEDICATIONS USED:  NONE  SPECIMEN:  Source of Specimen:  small bowel with perforation  DISPOSITION OF SPECIMEN:  PATHOLOGY  COUNTS:  YES  TOURNIQUET:  * No tourniquets in log *  DICTATION: .Dragon Dictation   After informed consent was obtained the patient was brought to the operating room and placed in the supine position on the operating table.  After adequate induction of general anesthesia the patient's abdomen was prepped with ChloraPrep, allowed to dry, and draped in usual sterile manner.  An appropriate timeout was performed.  I started with an upper midline incision with a 10 blade knife.  This incision was carried through the skin and subcutaneous tissue sharply with electrocautery until the linea alba was identified.  The linea alba was also incised with the electrocautery.  The preperitoneal space was probed bluntly with a hemostat until the peritoneum was opened and access was gained to the abdominal cavity.  The rest of the incision was then opened under direct vision.  The upper abdominal wall appeared to be free of any adhesions.  The lower abdominal wall had no fascia or subcutaneous tissue and there were loops of small bowel densely adherent to the skin of the anterior abdominal wall.  Upon entering the abdominal cavity we also encountered pus.  This was evacuated.  Many of the adhesions were taken down sharply with  Metzenbaum scissors.  The densely adherent small bowel was gently peeled off the skin of the lower abdominal wall by blunt finger dissection.  During this dissection we did identify an area of small bowel that had previously perforated and was the source of contamination in the abdominal cavity.  The small bowel loops that were densely adherent to the skin of the lower abdominal wall did have a couple of small serosal tears that were repaired with 2-0 silk Lembert stitches.  Once the lysis of adhesions was accomplished we were then able to run the small bowel from the ligament of Treitz to the ileocecal valve.  The area of perforated small bowel was then more easily identified.  A site was chosen above and below the area of perforation where the small intestine appeared more normal.  The mesentery to each of the sites was opened sharply with electrocautery.  The small bowel at each site was then divided by firings of a GIA-75 stapler.  The mesentery was taken down sharply with the LigaSure.  The proximal and distal segments approximated each other very easily.  A small opening was made on the antimesenteric edge of each limb of small bowel.  Each limb of a GIA-75 stapler was then placed down the appropriate limb of small bowel, clamped, and fired thereby creating a nice widely patent enteroenterostomy.  The common opening was closed with a single firing of the TA 60 stapler.  The mesenteric defect was closed with interrupted 2-0 silk stitches.  The abdomen was then irrigated with  copious amounts of saline.  Because of the amount of contamination in the abdominal cavity and because of the lower abdominal wall was basically just her skin I decided to close the upper abdominal wall fascia with interrupted #1 Novafil stitches.  I then applied a open abdominal cavity wound VAC to the lower abdomen.  This will give the abdominal cavity time to clean up and then we will bring her back on Friday for a second look and try to  then fashion a more definitive abdominal closure.  The VAC was placed to suction and there was a good seal.  The patient tolerated the procedure well.  At the end of the case all needle sponge and instrument counts were correct.  The patient was then awakened and taken to recovery in stable condition.  The assistant was crucial to completion of the case.  PLAN OF CARE: Admit to inpatient   PATIENT DISPOSITION:  ICU - extubated and stable.   Delay start of Pharmacological VTE agent (>24hrs) due to surgical blood loss or risk of bleeding: no

## 2020-04-19 NOTE — Anesthesia Preprocedure Evaluation (Addendum)
Anesthesia Evaluation  Patient identified by MRN, date of birth, ID band Patient awake    Reviewed: Allergy & Precautions, NPO status , Patient's Chart, lab work & pertinent test results  History of Anesthesia Complications Negative for: history of anesthetic complications  Airway Mallampati: II  TM Distance: >3 FB Neck ROM: Full    Dental  (+) Dental Advisory Given   Pulmonary Current Smoker and Patient abstained from smoking.,    Pulmonary exam normal        Cardiovascular negative cardio ROS Normal cardiovascular exam     Neuro/Psych negative neurological ROS  negative psych ROS   GI/Hepatic (+)     substance abuse  alcohol use,  SBO Colon cancer, liver metastases NGT in place    Endo/Other  negative endocrine ROS  Renal/GU negative Renal ROS     Musculoskeletal negative musculoskeletal ROS (+)   Abdominal   Peds  Hematology  On xarelto    Anesthesia Other Findings   Reproductive/Obstetrics                            Anesthesia Physical Anesthesia Plan  ASA: III  Anesthesia Plan: General   Post-op Pain Management:    Induction: Intravenous, Rapid sequence and Cricoid pressure planned  PONV Risk Score and Plan: 4 or greater and Treatment may vary due to age or medical condition, Ondansetron, Midazolam and Dexamethasone  Airway Management Planned: Oral ETT  Additional Equipment: None  Intra-op Plan:   Post-operative Plan: Extubation in OR  Informed Consent: I have reviewed the patients History and Physical, chart, labs and discussed the procedure including the risks, benefits and alternatives for the proposed anesthesia with the patient or authorized representative who has indicated his/her understanding and acceptance.   Patient has DNR.  Discussed DNR with patient and Suspend DNR.   Dental advisory given  Plan Discussed with: CRNA and  Anesthesiologist  Anesthesia Plan Comments:        Anesthesia Quick Evaluation

## 2020-04-19 NOTE — Transfer of Care (Signed)
Immediate Anesthesia Transfer of Care Note  Patient: Yolanda Watson  Procedure(s) Performed: EXPLORATORY LAPAROTOMY, REPAIR OF MULTIPLE SEROSAL TEARS (N/A Abdomen) SMALL BOWEL RESECTION (N/A Abdomen) LYSIS OF ADHESION APPLICATION OF WOUND VAC (N/A Abdomen)  Patient Location: PACU  Anesthesia Type:General  Level of Consciousness: drowsy and patient cooperative  Airway & Oxygen Therapy: Patient Spontanous Breathing and Patient connected to face mask oxygen  Post-op Assessment: Report given to RN and Post -op Vital signs reviewed and stable  Post vital signs: Reviewed and stable  Last Vitals:  Vitals Value Taken Time  BP 127/79 04/19/20 1423  Temp    Pulse 101 04/19/20 1424  Resp 20 04/19/20 1426  SpO2 95 % 04/19/20 1424  Vitals shown include unvalidated device data.  Last Pain:  Vitals:   04/19/20 1025  TempSrc: Oral  PainSc:       Patients Stated Pain Goal: 2 ([id number])  Complications: No apparent anesthesia complications

## 2020-04-19 NOTE — Progress Notes (Addendum)
PROGRESS NOTE  [name] [id number] DOB: [birth year] DOA: 04/11/2020 PCP: Patient, No Pcp Per   LOS: 8 days   Brief narrative: As per HPI,  Yolanda Watson is a 69 y.o. femalewith medical history significant formetastaticcolon cancer on chemotherapy,chronic tobacco abuse, abdominal wall thrombus on Xarelto presented to the hospital with complains of abdominal pain and found to have an SBO.  Patient was then admitted to the hospital for further evaluation and treatment.  Assessment/Plan:  Principal Problem:   SBO (small bowel obstruction) (HCC) Active Problems:   Abdominal pain   Nausea & vomiting   Acute hyponatremia   AKI (acute kidney injury) ([place])   Leukocytosis   Partial small bowel obstruction Imaging suggestive of small bowel obstruction in the mid abdomen.  Currently on NG tube.  Surgery on board.  Patient has not improved clinically.  Status post TPN.  Possibility of refeeding syndrome should be watched.  Will closely monitor electrolytes.  Patient will likely need surgery.  Hyponatremia  Improved.  Will closely monitor.  Hypokalemia Potassium of 3.4 today.  We will continue to replenish as necessary.  Check BMP in a.m.  AKI Baseline creatinine of 0.8. Creatinine of 1.68 on admission.  Improved.  Continue IV fluids.  Metabolic alkalosis Secondary to GI losses.  Improved at this time.  Closely monitor BMP.  Leukocytosis  mild elevated.  Likely reactive.  Check CBC in a.m.  Metastatic colon cancer On chemotherapy as outpatient.    Pancreatic duct dilation Incidental finding on CT abdomen/pelvis. No mass lesion noted. Patient is currently asymptomatic. Lipase minimally elevated.  Need to monitor as outpatient.  Abdominal wall thrombus On Xarelto as an outpatient.  Continue to hold for now.  Currently on Lovenox.  VTE Prophylaxis: Lovenox  Code Status: DNR  Family Communication: None today  Status is: Inpatient  Remains inpatient appropriate  because:Ongoing active pain requiring inpatient pain management, Unsafe d/c plan, IV treatments appropriate due to intensity of illness or inability to take PO and Inpatient level of care appropriate due to severity of illness, possible surgical intervention   Dispo: The patient is from: Home              Anticipated d/c is to: Home              Anticipated d/c date is: > 3 days              Patient currently is not medically stable to d/c.  Consultants:  General surgery  Procedures:  NG tube placement  TPN  Antibiotics:  . cipro preop  Anti-infectives (From admission, onward)   Start     Dose/Rate Route Frequency Ordered Stop   04/19/20 0600  ciprofloxacin (CIPRO) IVPB 400 mg     400 mg 200 mL/hr over 60 Minutes Intravenous On call to O.R. 04/18/20 1449 04/20/20 0559      Subjective: Today, patient was seen and examined at bedside.  She still complains of abdominal distention, nausea, no passing of flatus or bowel movement.  Feels frustrated  Objective: Vitals:   04/18/20 2045 04/19/20 0610  BP: 114/65 111/62  Pulse: 80 78  Resp: 18 18  Temp: 98 F (36.7 C) 98.2 F (36.8 C)  SpO2: 96% 93%    Intake/Output Summary (Last 24 hours) at 04/19/2020 0722 Last data filed at 04/19/2020 0600 Gross per 24 hour  Intake 3297.56 ml  Output 2950 ml  Net 347.56 ml   Filed Weights   04/17/20 0448 04/17/20 1143  04/19/20 0500  Weight: 49.7 kg 49.5 kg 52.7 kg   Body mass index is 18.75 kg/m.   Physical Exam:  GENERAL: Patient is alert awake and oriented. Not in obvious distress.  Thinly built, NG tube in place HENT: No scleral pallor or icterus. Pupils equally reactive to light. Oral mucosa is dry NECK: is supple, no gross swelling noted. CHEST: Clear to auscultation. No crackles or wheezes.  Diminished breath sounds bilaterally. CVS: S1 and S2 heard, no murmur. Regular rate and rhythm.  ABDOMEN: Scar from prior surgery, diffuse tenderness of the abdomen on minimal  palpation, mild distention, EXTREMITIES: No edema. CNS: Cranial nerves are intact. No focal motor deficits. SKIN: warm and dry without rashes.  Data Review: I have personally reviewed the following laboratory data and studies,  CBC: Recent Labs  Lab 04/14/20 0431 04/15/20 0303 04/16/20 0138 04/17/20 0140 04/18/20 0456  WBC 18.6* 18.7* 14.9* 14.9* 11.1*  NEUTROABS  --   --   --   --  9.0*  HGB 12.6 13.1 12.2 12.4 13.2  HCT 39.1 40.3 37.9 38.1 41.7  MCV 105.7* 104.9* 105.9* 107.3* 106.4*  PLT 219 252 242 255 [id number]   Basic Metabolic Panel: Recent Labs  Lab 04/15/20 0303 04/16/20 0138 04/16/20 1116 04/17/20 0140 04/18/20 0456 04/19/20 0540  NA 138 139  --  139 140  139 138  137  K 3.6 3.0*  --  3.4* 3.8  3.8 3.4*  3.4*  CL 91* 89*  --  89* 92*  91* 97*  96*  CO2 31 33*  --  33* 38*  36* 29  30  GLUCOSE 92 85  --  93 175*  173* 163*  164*  BUN 25* 18  --  15 19  18 22  23   CREATININE 0.85 0.76  --  0.83 0.70  0.76 0.65  0.62  CALCIUM 8.5* 8.1*  --  8.1* 8.3*  8.3* 8.1*  8.1*  MG  --   --  1.8 1.6* 2.4 1.8  PHOS  --   --   --  2.5 2.6  2.5 2.0*  2.0*   Liver Function Tests: Recent Labs  Lab 04/17/20 0140 04/18/20 0456 04/19/20 0540  AST  --  12*  --   ALT  --  9  --   ALKPHOS  --  58  --   BILITOT  --  0.6  --   PROT  --  5.8*  --   ALBUMIN 3.0* 3.0*  2.9* 2.5*   No results for input(s): LIPASE, AMYLASE in the last 168 hours. No results for input(s): AMMONIA in the last 168 hours. Cardiac Enzymes: No results for input(s): CKTOTAL, CKMB, CKMBINDEX, TROPONINI in the last 168 hours. BNP (last 3 results) No results for input(s): BNP in the last 8760 hours.  ProBNP (last 3 results) No results for input(s): PROBNP in the last 8760 hours.  CBG: Recent Labs  Lab 04/18/20 0605 04/18/20 1155 04/18/20 1807 04/19/20 0029 04/19/20 0606  GLUCAP 162* 166* 168* 164* 144*   Recent Results (from the past 240 hour(s))  Respiratory Panel by RT PCR  (Flu A&B, Covid) - Nasopharyngeal Swab     Status: None   Collection Time: 04/11/20  6:49 PM   Specimen: Nasopharyngeal Swab  Result Value Ref Range Status   SARS Coronavirus 2 by RT PCR NEGATIVE NEGATIVE Final    Comment: (NOTE) SARS-CoV-2 target nucleic acids are NOT DETECTED. The SARS-CoV-2 RNA is generally detectable in upper respiratoy  specimens during the acute phase of infection. The lowest concentration of SARS-CoV-2 viral copies this assay can detect is 131 copies/mL. A negative result does not preclude SARS-Cov-2 infection and should not be used as the sole basis for treatment or other patient management decisions. A negative result may occur with  improper specimen collection/handling, submission of specimen other than nasopharyngeal swab, presence of viral mutation(s) within the areas targeted by this assay, and inadequate number of viral copies (<131 copies/mL). A negative result must be combined with clinical observations, patient history, and epidemiological information. The expected result is Negative. Fact Sheet for Patients:  PinkCheek.be Fact Sheet for Healthcare Providers:  GravelBags.it This test is not yet ap proved or cleared by the [place] FDA and  has been authorized for detection and/or diagnosis of SARS-CoV-2 by FDA under an Emergency Use Authorization (EUA). This EUA will remain  in effect (meaning this test can be used) for the duration of the COVID-19 declaration under Section 564(b)(1) of the Act, 21 U.S.C. section 360bbb-3(b)(1), unless the authorization is terminated or revoked sooner.    Influenza A by PCR NEGATIVE NEGATIVE Final   Influenza B by PCR NEGATIVE NEGATIVE Final    Comment: (NOTE) The Xpert Xpress SARS-CoV-2/FLU/RSV assay is intended as an aid in  the diagnosis of influenza from Nasopharyngeal swab specimens and  should not be used as a sole basis for treatment. Nasal  washings and  aspirates are unacceptable for Xpert Xpress SARS-CoV-2/FLU/RSV  testing. Fact Sheet for Patients: PinkCheek.be Fact Sheet for Healthcare Providers: GravelBags.it This test is not yet approved or cleared by the [place] FDA and  has been authorized for detection and/or diagnosis of SARS-CoV-2 by  FDA under an Emergency Use Authorization (EUA). This EUA will remain  in effect (meaning this test can be used) for the duration of the  Covid-19 declaration under Section 564(b)(1) of the Act, 21  U.S.C. section 360bbb-3(b)(1), unless the authorization is  terminated or revoked. Performed at [facility], [place] [address] [place], [place] [address]   Surgical pcr screen     Status: None   Collection Time: 04/19/20  2:13 AM   Specimen: Nasal Mucosa; Nasal Swab  Result Value Ref Range Status   MRSA, PCR NEGATIVE NEGATIVE Final   Staphylococcus aureus NEGATIVE NEGATIVE Final    Comment: (NOTE) The Xpert SA Assay (FDA approved for NASAL specimens in patients 9 years of age and older), is one component of a comprehensive surveillance program. It is not intended to diagnose infection nor to guide or monitor treatment. Performed at [facility], [place] [address], [place], [place] [address]      Studies: DG Abd Portable 1V  Result Date: 04/18/2020 CLINICAL DATA:  Small bowel obstruction. EXAM: PORTABLE ABDOMEN - 1 VIEW COMPARISON:  One-view abdomen 04/17/20 FINDINGS: Dilated loops of small bowel remain, slightly less conspicuous than on the prior exam. Wall thickening is improved. Oral contrast is present colon. NG tube is in place. IMPRESSION: 1. Improving small bowel obstruction. 2. Oral contrast is present in the colon. Electronically Signed   By: San Morelle M.D.   On: 04/18/2020 07:48    Flora Lipps, MD  [facility] 04/19/2020

## 2020-04-19 NOTE — Progress Notes (Signed)
PHARMACY - TOTAL PARENTERAL NUTRITION CONSULT NOTE   Indication: small bowel obstruction  Patient Measurements: Height: 5\' 6"  (167.6 cm) Weight: 52.7 kg (116 lb 2.9 oz) IBW/kg (Calculated) : 59.3   Body mass index is 18.75 kg/m.  Assessment: 14 y/oF with PMH of metastatic colon cancer, abdominal wall thrombus who presented on 04/11/2020 secondary to abdominal pain and found to have SBO. Pharmacy consulted for TPN.   Glucose / Insulin: No hx of DM, goal CBG <150. CBGs 144-173, slightly above goal. 7 units SSI given/24 hrs.  Electrolytes:  K, Mg, Phos low. Na, CorrCa (9.3) - WNL.  Chloride slightly low (97) but improved, CO2 WNL. Renal: SCr/BUN WNL, stable LFTs / TGs: LFTs WNL (4/14)/Triglycerides 99 (4/20) Prealbumin / albumin: 14.7 (4/16), < 5 (4/20)  Intake / Output; MIVF: I/O net: +346 mL. Unmeasured UOP x1. NG output 1700 mL. LR at 40 mL/hr GI Imaging:  -4/13 CT abd/pelvis: SBO with transition point in the mid abdomen -4/19 AXR: Mild improvement in the pattern of partial small bowel obstruction with interval evidence of mucosal thickening involving the dilated loops of jejunum without pneumatosis.  Surgeries / Procedures:  -4/21: Possible ex lap with small bowel resection  Central access: port  TPN start date: 04/17/2020  Nutritional Goals: per RD recommendation on 4/20: Kcal: 1735 - 1900  Protein: 85-95 g Fluid: >/= 1.8 L/day  Custom TPN at goal rate of 75 mL/hr will provide 94 g protein, 270 g dextrose, 1832 kcal; meeting 100% of patient needs.   Current Nutrition:  -TPN -Diet: NPO except ice chips, sips of liquid for comfort  Plan:  Now:   Magnesium sulfate 2 g IV once  KCl 10 mEq IV x2  Potassium phosphate 15 mmol IV once  -At 1800:  Continue TPN at 79mL/hr; will hold off on advancing rate given low electrolytes and risk of refeeding syndrome  Electrolytes in TPN: Slightly increase K, Phos.   16mEq/L of Na, 58mEq/L of K, 37mEq/L of Ca, 21mEq/L of Mag, and  49mmol/L of Phos.   Cl:Ac ratio - max chloride  Standard MVI and trace elements to TPN.   Continue Sensitive q6h SSI and adjust as needed.    Continue IVF at 40 ml/hr or per MD  Monitor TPN labs on Mon/Thurs. Recheck electrolytes with AM labs tomorrow   Lenis Noon, PharmD 04/19/20 11:08 AM

## 2020-04-20 ENCOUNTER — Inpatient Hospital Stay: Payer: Medicare Other

## 2020-04-20 ENCOUNTER — Inpatient Hospital Stay (HOSPITAL_COMMUNITY): Payer: Medicare Other

## 2020-04-20 ENCOUNTER — Inpatient Hospital Stay: Payer: Medicare Other | Admitting: Oncology

## 2020-04-20 DIAGNOSIS — K56609 Unspecified intestinal obstruction, unspecified as to partial versus complete obstruction: Secondary | ICD-10-CM | POA: Diagnosis not present

## 2020-04-20 LAB — RENAL FUNCTION PANEL
Albumin: 2.2 g/dL — ABNORMAL LOW (ref 3.5–5.0)
Anion gap: 8 (ref 5–15)
BUN: 21 mg/dL (ref 8–23)
CO2: 27 mmol/L (ref 22–32)
Calcium: 6.9 mg/dL — ABNORMAL LOW (ref 8.9–10.3)
Chloride: 101 mmol/L (ref 98–111)
Creatinine, Ser: 0.65 mg/dL (ref 0.44–1.00)
GFR calc Af Amer: >60 mL/min (ref >60)
GFR calc non Af Amer: >60 mL/min (ref >60)
Glucose, Bld: 257 mg/dL — ABNORMAL HIGH (ref 70–99)
Phosphorus: 3.7 mg/dL (ref 2.5–4.6)
Potassium: 4.5 mmol/L (ref 3.5–5.1)
Sodium: 136 mmol/L (ref 135–145)

## 2020-04-20 LAB — COMPREHENSIVE METABOLIC PANEL
ALT: 9 U/L (ref 0–44)
AST: 10 U/L — ABNORMAL LOW (ref 15–41)
Albumin: 2.2 g/dL — ABNORMAL LOW (ref 3.5–5.0)
Alkaline Phosphatase: 56 U/L (ref 38–126)
Anion gap: 8 (ref 5–15)
BUN: 21 mg/dL (ref 8–23)
CO2: 27 mmol/L (ref 22–32)
Calcium: 7 mg/dL — ABNORMAL LOW (ref 8.9–10.3)
Chloride: 101 mmol/L (ref 98–111)
Creatinine, Ser: 0.58 mg/dL (ref 0.44–1.00)
GFR calc Af Amer: >60 mL/min (ref >60)
GFR calc non Af Amer: >60 mL/min (ref >60)
Glucose, Bld: 257 mg/dL — ABNORMAL HIGH (ref 70–99)
Potassium: 4.5 mmol/L (ref 3.5–5.1)
Sodium: 136 mmol/L (ref 135–145)
Total Bilirubin: 0.3 mg/dL (ref 0.3–1.2)
Total Protein: 4.8 g/dL — ABNORMAL LOW (ref 6.5–8.1)

## 2020-04-20 LAB — GLUCOSE, CAPILLARY
Glucose-Capillary: 119 mg/dL — ABNORMAL HIGH (ref 70–99)
Glucose-Capillary: 149 mg/dL — ABNORMAL HIGH (ref 70–99)
Glucose-Capillary: 150 mg/dL — ABNORMAL HIGH (ref 70–99)
Glucose-Capillary: 230 mg/dL — ABNORMAL HIGH (ref 70–99)

## 2020-04-20 LAB — CBC
HCT: 35.6 % — ABNORMAL LOW (ref 36.0–46.0)
Hemoglobin: 11.5 g/dL — ABNORMAL LOW (ref 12.0–15.0)
MCH: 34.5 pg — ABNORMAL HIGH (ref 26.0–34.0)
MCHC: 32.3 g/dL (ref 30.0–36.0)
MCV: 106.9 fL — ABNORMAL HIGH (ref 80.0–100.0)
Platelets: 148 10*3/uL — ABNORMAL LOW (ref 150–400)
RBC: 3.33 MIL/uL — ABNORMAL LOW (ref 3.87–5.11)
RDW: 13 % (ref 11.5–15.5)
WBC: 16.8 10*3/uL — ABNORMAL HIGH (ref 4.0–10.5)
nRBC: 0 % (ref 0.0–0.2)

## 2020-04-20 LAB — MAGNESIUM: Magnesium: 1.7 mg/dL (ref 1.7–2.4)

## 2020-04-20 LAB — PHOSPHORUS: Phosphorus: 3.7 mg/dL (ref 2.5–4.6)

## 2020-04-20 MED ORDER — TRAVASOL 10 % IV SOLN
INTRAVENOUS | Status: AC
  Filled 2020-04-20: qty 748.8

## 2020-04-20 MED ORDER — CIPROFLOXACIN IN D5W 400 MG/200ML IV SOLN
400.0000 mg | Freq: Two times a day (BID) | INTRAVENOUS | Status: DC
  Administered 2020-04-20 – 2020-04-30 (×22): 400 mg via INTRAVENOUS
  Filled 2020-04-20 (×22): qty 200

## 2020-04-20 MED ORDER — METRONIDAZOLE IN NACL 5-0.79 MG/ML-% IV SOLN
500.0000 mg | Freq: Three times a day (TID) | INTRAVENOUS | Status: DC
  Administered 2020-04-20 – 2020-05-01 (×32): 500 mg via INTRAVENOUS
  Filled 2020-04-20 (×32): qty 100

## 2020-04-20 MED ORDER — MORPHINE SULFATE (PF) 2 MG/ML IV SOLN
1.0000 mg | INTRAVENOUS | Status: DC | PRN
  Administered 2020-04-20 – 2020-04-21 (×9): 4 mg via INTRAVENOUS
  Administered 2020-04-22: 2 mg via INTRAVENOUS
  Administered 2020-04-22: 4 mg via INTRAVENOUS
  Administered 2020-04-22 (×2): 2 mg via INTRAVENOUS
  Administered 2020-04-22: 4 mg via INTRAVENOUS
  Filled 2020-04-20 (×4): qty 2
  Filled 2020-04-20: qty 1
  Filled 2020-04-20 (×2): qty 2
  Filled 2020-04-20: qty 1
  Filled 2020-04-20 (×6): qty 2

## 2020-04-20 MED ORDER — SODIUM CHLORIDE 0.9 % IV SOLN: INTRAVENOUS | Status: DC

## 2020-04-20 MED ORDER — SODIUM CHLORIDE 0.9% FLUSH
10.0000 mL | Freq: Two times a day (BID) | INTRAVENOUS | Status: DC
  Administered 2020-04-20: 10 mL
  Administered 2020-04-20: 20 mL
  Administered 2020-04-21 – 2020-05-02 (×14): 10 mL

## 2020-04-20 NOTE — Progress Notes (Signed)
PHARMACY - TOTAL PARENTERAL NUTRITION CONSULT NOTE   Indication: small bowel obstruction  Patient Measurements: Height: 5\' 6"  (167.6 cm) Weight: 53.6 kg (118 lb 2.7 oz) IBW/kg (Calculated) : 59.3   Body mass index is 19.07 kg/m.  Assessment: 20 y/oF with PMH of metastatic colon cancer, abdominal wall thrombus who presented on 04/11/2020 secondary to abdominal pain and found to have SBO. Pharmacy consulted for TPN.   Glucose / Insulin: No hx of DM, goal CBG <150. CBGs elevated post-op (230-258), 13 units SSI given/24 hrs. Received decadron 4mg  IV x1 dose intra-op.  Electrolytes:  K, Mg, Phos Na, CorrCa (8.44) - WNL.  Chloride, CO2 WNL. Renal: SCr/BUN WNL, stable LFTs / TGs: LFTs WNL (4/14)/Triglycerides 99 (4/20) Prealbumin / albumin: 14.7 (4/16), < 5 (4/20)  Intake / Output; MIVF: I/O net: +2586 mL. NG/drain output 960 mL. D5NS at 100 mL/hr GI Imaging:  -4/13 CT abd/pelvis: SBO with transition point in the mid abdomen -4/19 AXR: Mild improvement in the pattern of partial small bowel obstruction with interval evidence of mucosal thickening involving the dilated loops of jejunum without pneumatosis.  Surgeries / Procedures:  -4/21: Ex lap with small bowel resection & wound vac  Central access: port  TPN start date: 04/17/2020  Nutritional Goals: per RD recommendation on 4/20: Kcal: 1735 - 1900  Protein: 85-95 g Fluid: >/= 1.8 L/day  Custom TPN at goal rate of 75 mL/hr will provide 94 g protein, 270 g dextrose, 1832 kcal; meeting 100% of patient needs.   Current Nutrition:  -TPN -Diet: NPO except ice chips, sips of liquid for comfort  Plan:  At 1800:  Continue TPN at 51mL/hr; will hold off on advancing rate given elevated glucose.  Anticipate this will improve as dexamethasone is cleared.   Electrolytes in TPN:   92mEq/L of Na, 15mEq/L of K, 25mEq/L of Ca, 48mEq/L of Mag, and 60mmol/L of Phos.   Cl:Ac ratio - max chloride  Standard MVI and trace elements to TPN.    Continue Sensitive q6h SSI and adjust as needed.    Continue IVF at 100 ml/hr or per MD  Monitor TPN labs on Mon/Thurs. Recheck electrolytes with AM labs tomorrow.   Netta Cedars, PharmD 04/20/20 @ 7:27 AM

## 2020-04-20 NOTE — Progress Notes (Signed)
PROGRESS NOTE    Yolanda Watson  [id number] DOB: 22-Aug-1951 DOA: 04/11/2020 PCP: Patient, No Pcp Per   Brief Narrative: 69 year old with past medical history significant for metastatic colon cancer on chemotherapy, chronic tobacco abuse, abdominal wall thrombosis on Xarelto presented to the hospital with complaints of abdominal pain and found to have an SBO.  Patient was admitted to the hospital for further evaluation and treatment.  Patient failed to improve with medical management, she underwent oratory laparotomy, repair of multiple serosal tears, small bowel resection, lysis of adhesion, application of a wound VAC on 4/24.   Assessment & Plan:   Principal Problem:   SBO (small bowel obstruction) (HCC) Active Problems:   Abdominal pain   Nausea & vomiting   Acute hyponatremia   AKI (acute kidney injury) ([place])   Leukocytosis   1-Small bowel obstruction: with perforation.  -Underwent exploratory laparotomy, repair of multiple serosal tears, small bowel resection, lysis of adhesion and application of wound VAC on 4/21st. -N.p.o., still with NG tube in place. -Surgery planning to take patient back to the OR tomorrow to try to close her abdominal wall wound. -Continue with TPN. -Surgery using Cipro for prophylaxis.   2-Hyponatremia: Continue with IV fluids  3-Acute hypoxic respiratory failure: Postop, and use of opioids. Chest X ray: Showed low lung volumes. Incentive spirometry. Wean off of oxygen as tolerated. Will change morphine every 2 hour PRN  4-AKI: During this admission up to 1.6. Improved with IV fluids.  Metabolic alkalosis: Secondary to GI losses. On IV Fluids  Hypokalemia: Resolved.  Leukocytosis: Related to #1  Metastatatic colon cancer: Follow with Dr Benay Spice.   Pancreatic duct dilation Incidental finding on CT abdomen/pelvis. No mass lesion noted. Patient is currently asymptomatic. Lipase minimally elevated.  Need to monitor as  outpatient.  Abdominal wall thrombosis: Was on Xarelto as an outpatient. resume when okay by surgery  Nutrition Problem: Inadequate oral intake Etiology: inability to eat    Signs/Symptoms: NPO status    Interventions: TPN  Estimated body mass index is 19.07 kg/m as calculated from the following:   Height as of this encounter: 5\' 6"  (1.676 m).   Weight as of this encounter: 53.6 kg.   DVT prophylaxis: Lovenox Code Status: DNR Family Communication: Husband at bedside Disposition Plan:  Patient is from: Home  Anticipated d/c date: 4--5 days depend on recovery from surgery  Barriers to d/c or necessity for inpatient status: needs repeat Sx for wound closure tomorrow. Not tolerating diet.   Consultants:   Surgery  Procedures:  t exploratory laparotomy, repair of multiple serosal tears, small bowel resection, lysis of adhesion and application of wound VAC on 4/21st.    Antimicrobials:    Subjective: Alert, on NB mask oxygen drop post op and pain medication.  Denies dyspnea. , she was able to be wean down to 6 L.    Objective: Vitals:   04/20/20 0400 04/20/20 0408 04/20/20 0500 04/20/20 0600  BP: 113/60  (!) 99/54 108/64  Pulse: 81  74 74  Resp: (!) 21  15 10   Temp:  97.7 F (36.5 C)    TempSrc:  Axillary    SpO2: 95%  95% 97%  Weight:   53.6 kg   Height:        Intake/Output Summary (Last 24 hours) at 04/20/2020 0741 Last data filed at 04/20/2020 0500 Gross per 24 hour  Intake 5390.49 ml  Output 2160 ml  Net 3230.49 ml   Filed Weights   04/17/20 1143  04/19/20 0500 04/20/20 0500  Weight: 49.5 kg 52.7 kg 53.6 kg    Examination:  General exam: Appears calm and comfortable  Respiratory system: Clear to auscultation. Respiratory effort normal. Cardiovascular system: S1 & S2 heard, RRR. No JVD, murmurs, rubs, gallops or clicks. No pedal edema. Gastrointestinal system: Abdomen is soft, wound vac in place.  Central nervous system: Alert and oriented. No  focal neurological deficits. Extremities: Symmetric 5 x 5 power.     Data Reviewed: I have personally reviewed following labs and imaging studies  CBC: Recent Labs  Lab 04/15/20 0303 04/16/20 0138 04/17/20 0140 04/18/20 0456 04/20/20 0507  WBC 18.7* 14.9* 14.9* 11.1* 16.8*  NEUTROABS  --   --   --  9.0*  --   HGB 13.1 12.2 12.4 13.2 11.5*  HCT 40.3 37.9 38.1 41.7 35.6*  MCV 104.9* 105.9* 107.3* 106.4* 106.9*  PLT 252 242 255 260 123456*   Basic Metabolic Panel: Recent Labs  Lab 04/16/20 0138 04/16/20 1116 04/17/20 0140 04/18/20 0456 04/19/20 0540 04/20/20 0507  NA 139  --  139 140  139 138  137 136  136  K 3.0*  --  3.4* 3.8  3.8 3.4*  3.4* 4.5  4.5  CL 89*  --  89* 92*  91* 97*  96* 101  101  CO2 33*  --  33* 38*  36* 29  30 27  27   GLUCOSE 85  --  93 175*  173* 163*  164* 257*  257*  BUN 18  --  15 19  18 22  23 21  21   CREATININE 0.76  --  0.83 0.70  0.76 0.65  0.62 0.58  0.65  CALCIUM 8.1*  --  8.1* 8.3*  8.3* 8.1*  8.1* 7.0*  6.9*  MG  --  1.8 1.6* 2.4 1.8 1.7  PHOS  --   --  2.5 2.6  2.5 2.0*  2.0* 3.7  3.7   GFR: Estimated Creatinine Clearance: 57.7 mL/min (by C-G formula based on SCr of 0.65 mg/dL). Liver Function Tests: Recent Labs  Lab 04/17/20 0140 04/18/20 0456 04/19/20 0540 04/20/20 0507  AST  --  12*  --  10*  ALT  --  9  --  9  ALKPHOS  --  58  --  56  BILITOT  --  0.6  --  0.3  PROT  --  5.8*  --  4.8*  ALBUMIN 3.0* 3.0*  2.9* 2.5* 2.2*  2.2*   No results for input(s): LIPASE, AMYLASE in the last 168 hours. No results for input(s): AMMONIA in the last 168 hours. Coagulation Profile: No results for input(s): INR, PROTIME in the last 168 hours. Cardiac Enzymes: No results for input(s): CKTOTAL, CKMB, CKMBINDEX, TROPONINI in the last 168 hours. BNP (last 3 results) No results for input(s): PROBNP in the last 8760 hours. HbA1C: No results for input(s): HGBA1C in the last 72 hours. CBG: Recent Labs  Lab  04/19/20 0606 04/19/20 1026 04/19/20 1747 04/19/20 2323 04/20/20 0406  GLUCAP 144* 113* 247* 258* 230*   Lipid Profile: Recent Labs    04/18/20 0456  TRIG 99   Thyroid Function Tests: No results for input(s): TSH, T4TOTAL, FREET4, T3FREE, THYROIDAB in the last 72 hours. Anemia Panel: No results for input(s): VITAMINB12, FOLATE, FERRITIN, TIBC, IRON, RETICCTPCT in the last 72 hours. Sepsis Labs: No results for input(s): PROCALCITON, LATICACIDVEN in the last 168 hours.  Recent Results (from the past 240 hour(s))  Respiratory Panel by  RT PCR (Flu A&B, Covid) - Nasopharyngeal Swab     Status: None   Collection Time: 04/11/20  6:49 PM   Specimen: Nasopharyngeal Swab  Result Value Ref Range Status   SARS Coronavirus 2 by RT PCR NEGATIVE NEGATIVE Final    Comment: (NOTE) SARS-CoV-2 target nucleic acids are NOT DETECTED. The SARS-CoV-2 RNA is generally detectable in upper respiratoy specimens during the acute phase of infection. The lowest concentration of SARS-CoV-2 viral copies this assay can detect is 131 copies/mL. A negative result does not preclude SARS-Cov-2 infection and should not be used as the sole basis for treatment or other patient management decisions. A negative result may occur with  improper specimen collection/handling, submission of specimen other than nasopharyngeal swab, presence of viral mutation(s) within the areas targeted by this assay, and inadequate number of viral copies (<131 copies/mL). A negative result must be combined with clinical observations, patient history, and epidemiological information. The expected result is Negative. Fact Sheet for Patients:  PinkCheek.be Fact Sheet for Healthcare Providers:  GravelBags.it This test is not yet ap proved or cleared by the [place] FDA and  has been authorized for detection and/or diagnosis of SARS-CoV-2 by FDA under an Emergency Use  Authorization (EUA). This EUA will remain  in effect (meaning this test can be used) for the duration of the COVID-19 declaration under Section 564(b)(1) of the Act, 21 U.S.C. section 360bbb-3(b)(1), unless the authorization is terminated or revoked sooner.    Influenza A by PCR NEGATIVE NEGATIVE Final   Influenza B by PCR NEGATIVE NEGATIVE Final    Comment: (NOTE) The Xpert Xpress SARS-CoV-2/FLU/RSV assay is intended as an aid in  the diagnosis of influenza from Nasopharyngeal swab specimens and  should not be used as a sole basis for treatment. Nasal washings and  aspirates are unacceptable for Xpert Xpress SARS-CoV-2/FLU/RSV  testing. Fact Sheet for Patients: PinkCheek.be Fact Sheet for Healthcare Providers: GravelBags.it This test is not yet approved or cleared by the [place] FDA and  has been authorized for detection and/or diagnosis of SARS-CoV-2 by  FDA under an Emergency Use Authorization (EUA). This EUA will remain  in effect (meaning this test can be used) for the duration of the  Covid-19 declaration under Section 564(b)(1) of the Act, 21  U.S.C. section 360bbb-3(b)(1), unless the authorization is  terminated or revoked. Performed at [facility], [place] [address] [place], [place] [address]   Surgical pcr screen     Status: None   Collection Time: 04/19/20  2:13 AM   Specimen: Nasal Mucosa; Nasal Swab  Result Value Ref Range Status   MRSA, PCR NEGATIVE NEGATIVE Final   Staphylococcus aureus NEGATIVE NEGATIVE Final    Comment: (NOTE) The Xpert SA Assay (FDA approved for NASAL specimens in patients 55 years of age and older), is one component of a comprehensive surveillance program. It is not intended to diagnose infection nor to guide or monitor treatment. Performed at [facility], [place] [address] [place], [place] [address]          Radiology Studies: No  results found.      Scheduled Meds: . Chlorhexidine Gluconate Cloth  6 each Topical Daily  . enoxaparin (LOVENOX) injection  40 mg Subcutaneous Q24H  . insulin aspart  0-9 Units Subcutaneous Q6H  . mouth rinse  15 mL Mouth Rinse BID  . sodium chloride flush  3 mL Intravenous Q12H   Continuous Infusions: . acetaminophen Stopped (04/20/20 0321)  . dextrose  5 % and 0.9% NaCl 100 mL/hr at 04/20/20 0500  . TPN ADULT (ION) 60 mL/hr at 04/20/20 0500     LOS: 9 days    Time spent: 35 minutes.     Elmarie Shiley, MD [facility]   If 7PM-7AM, please contact night-coverage [email or web address]  04/20/2020, 7:41 AM

## 2020-04-20 NOTE — Progress Notes (Signed)
Patient ID: Yolanda Watson, female   DOB: June 10, 1951, 69 y.o.   MRN: [id number]    1 Day Post-Op  Subjective: Patient feels much better today and relieved to be alive.  No flatus.  NGT in place with bilious output.  ROS: See above, otherwise other systems negative  Objective: Vital signs in last 24 hours: Temp:  [97.4 F (36.3 C)-98.8 F (37.1 C)] 97.9 F (36.6 C) (04/22 0800) Pulse Rate:  [74-105] 74 (04/22 0600) Resp:  [10-24] 10 (04/22 0600) BP: (99-137)/(54-77) 108/64 (04/22 0600) SpO2:  [85 %-97 %] 97 % (04/22 0600) Weight:  [53.6 kg] 53.6 kg (04/22 0500) Last BM Date: 04/13/20  Intake/Output from previous day: 04/21 0701 - 04/22 0700 In: 5390.5 [I.V.:4418.9; IV Piggyback:971.6] Out: 2160 [Urine:900; Emesis/NG output:550; Drains:560; Blood:50] Intake/Output this shift: No intake/output data recorded.  PE: Abd: soft, appropriately tender, ABTHERA VAC in place over lower abdominal incision.  Hypoactive BS, NGT with bilious output as expected.  Lab Results:  Recent Labs    04/18/20 0456 04/20/20 0507  WBC 11.1* 16.8*  HGB 13.2 11.5*  HCT 41.7 35.6*  PLT 260 148*   BMET Recent Labs    04/19/20 0540 04/20/20 0507  NA 138  137 136  136  K 3.4*  3.4* 4.5  4.5  CL 97*  96* 101  101  CO2 29  30 27  27   GLUCOSE 163*  164* 257*  257*  BUN 22  23 21  21   CREATININE 0.65  0.62 0.58  0.65  CALCIUM 8.1*  8.1* 7.0*  6.9*   PT/INR No results for input(s): LABPROT, INR in the last 72 hours. CMP     Component Value Date/Time   NA 136 04/20/2020 0507   NA 136 04/20/2020 0507   NA 141 01/15/2018 0000   NA 138 08/07/2017 0835   K 4.5 04/20/2020 0507   K 4.5 04/20/2020 0507   K 4.1 08/07/2017 0835   CL 101 04/20/2020 0507   CL 101 04/20/2020 0507   CO2 27 04/20/2020 0507   CO2 27 04/20/2020 0507   CO2 25 08/07/2017 0835   GLUCOSE 257 (H) 04/20/2020 0507   GLUCOSE 257 (H) 04/20/2020 0507   GLUCOSE 95 08/07/2017 0835   BUN 21 04/20/2020 0507    BUN 21 04/20/2020 0507   BUN 4 01/15/2018 0000   BUN 9.3 08/07/2017 0835   CREATININE 0.65 04/20/2020 0507   CREATININE 0.58 04/20/2020 0507   CREATININE 0.80 03/16/2020 1002   CREATININE 0.57 02/05/2018 1144   CREATININE 0.7 08/07/2017 0835   CALCIUM 6.9 (L) 04/20/2020 0507   CALCIUM 7.0 (L) 04/20/2020 0507   CALCIUM 9.4 08/07/2017 0835   PROT 4.8 (L) 04/20/2020 0507   PROT 7.3 08/07/2017 0835   ALBUMIN 2.2 (L) 04/20/2020 0507   ALBUMIN 2.2 (L) 04/20/2020 0507   ALBUMIN 3.4 (L) 08/07/2017 0835   AST 10 (L) 04/20/2020 0507   AST 13 (L) 03/16/2020 1002   AST 25 08/07/2017 0835   ALT 9 04/20/2020 0507   ALT 11 03/16/2020 1002   ALT 21 08/07/2017 0835   ALKPHOS 56 04/20/2020 0507   ALKPHOS 138 08/07/2017 0835   BILITOT 0.3 04/20/2020 0507   BILITOT 0.4 03/16/2020 1002   BILITOT 0.49 08/07/2017 0835   GFRNONAA >60 04/20/2020 0507   GFRNONAA >60 04/20/2020 0507   GFRNONAA >60 03/16/2020 1002   GFRAA >60 04/20/2020 0507   GFRAA >60 04/20/2020 0507   GFRAA >60 03/16/2020 1002  Lipase     Component Value Date/Time   LIPASE 59 (H) 04/11/2020 1542       Studies/Results: No results found.  Anti-infectives: Anti-infectives (From admission, onward)   Start     Dose/Rate Route Frequency Ordered Stop   04/19/20 0600  ciprofloxacin (CIPRO) IVPB 400 mg     400 mg 200 mL/hr over 60 Minutes Intravenous On call to O.R. 04/18/20 1449 04/19/20 1200       Assessment/Plan Chronic tobacco use Abdominal wall thrombus-on Xarelto Hx EtOH abuse AKI Malnutrition - prealbumin 14.7 (4/16), on TNA  Metastatic colon cancerwith right hemicolectomy 2016 - ongoing chemotherapy, followed by Dr. Benay Spice -Excision of malignant mass from the abdominal including skin, muscle, fascia and peritoneum(15 x 12cm), abdominal reconstruction with [name], 10/03/17 Dr. Barry Dienes and [name]   POD 1, s/p ex lap with SBR for contained perforation from SBO -cont ABTHERA vac today.  Plan to  return to OR tomorrow to determine how to close her abdominal wall and wash her out again. -cont NGT today -minimize mobilization today as she has an open abdomen currently. -cont abx therapy for perforation of SB  FEN -NPO/NGT/IVFs VTE -Lovenox ID -cipro/Flagyl 4/22 -->   LOS: 9 days    Henreitta Cea , [facility] Surgery 04/20/2020, 8:52 AM Please see Amion for pager number during day hours 7:00am-4:30pm or 7:00am -11:30am on weekends

## 2020-04-20 NOTE — Anesthesia Postprocedure Evaluation (Signed)
Anesthesia Post Note  Patient: Yolanda Watson  Procedure(s) Performed: EXPLORATORY LAPAROTOMY, REPAIR OF MULTIPLE SEROSAL TEARS (N/A Abdomen) SMALL BOWEL RESECTION (N/A Abdomen) LYSIS OF ADHESION APPLICATION OF WOUND VAC (N/A Abdomen)     Patient location during evaluation: PACU Anesthesia Type: General Level of consciousness: awake and alert, oriented and patient cooperative Pain management: pain level controlled Vital Signs Assessment: post-procedure vital signs reviewed and stable Respiratory status: spontaneous breathing, nonlabored ventilation and respiratory function stable Cardiovascular status: blood pressure returned to baseline and stable Postop Assessment: no apparent nausea or vomiting Anesthetic complications: no    Last Vitals:  Vitals:   04/19/20 2326 04/20/20 0000  BP:  131/74  Pulse:  85  Resp:  16  Temp: 36.6 C   SpO2:  97%    Last Pain:  Vitals:   04/20/20 0100  TempSrc:   PainSc: Asleep   Pain Goal: Patients Stated Pain Goal: 2 (04/19/20 0706)                 Pervis Hocking

## 2020-04-21 ENCOUNTER — Ambulatory Visit: Payer: Self-pay | Admitting: Surgery

## 2020-04-21 ENCOUNTER — Encounter (HOSPITAL_COMMUNITY): Payer: Self-pay | Admitting: Internal Medicine

## 2020-04-21 ENCOUNTER — Encounter (HOSPITAL_COMMUNITY)
Admission: EM | Disposition: A | Discharge status: Home Health | Payer: Self-pay | Source: Home / Self Care | Attending: Family Medicine

## 2020-04-21 ENCOUNTER — Inpatient Hospital Stay (HOSPITAL_COMMUNITY): Payer: Medicare Other | Admitting: Anesthesiology

## 2020-04-21 DIAGNOSIS — K56609 Unspecified intestinal obstruction, unspecified as to partial versus complete obstruction: Secondary | ICD-10-CM | POA: Diagnosis not present

## 2020-04-21 DIAGNOSIS — Q7959 Other congenital malformations of abdominal wall: Secondary | ICD-10-CM | POA: Diagnosis not present

## 2020-04-21 HISTORY — PX: LAPAROTOMY: SHX154

## 2020-04-21 LAB — GLUCOSE, CAPILLARY
Glucose-Capillary: 115 mg/dL — ABNORMAL HIGH (ref 70–99)
Glucose-Capillary: 117 mg/dL — ABNORMAL HIGH (ref 70–99)
Glucose-Capillary: 203 mg/dL — ABNORMAL HIGH (ref 70–99)
Glucose-Capillary: 216 mg/dL — ABNORMAL HIGH (ref 70–99)

## 2020-04-21 LAB — RENAL FUNCTION PANEL
Albumin: 2.2 g/dL — ABNORMAL LOW (ref 3.5–5.0)
Anion gap: 8 (ref 5–15)
BUN: 20 mg/dL (ref 8–23)
CO2: 27 mmol/L (ref 22–32)
Calcium: 7.7 mg/dL — ABNORMAL LOW (ref 8.9–10.3)
Chloride: 103 mmol/L (ref 98–111)
Creatinine, Ser: 0.51 mg/dL (ref 0.44–1.00)
GFR calc Af Amer: >60 mL/min (ref >60)
GFR calc non Af Amer: >60 mL/min (ref >60)
Glucose, Bld: 128 mg/dL — ABNORMAL HIGH (ref 70–99)
Phosphorus: 2.5 mg/dL (ref 2.5–4.6)
Potassium: 4.5 mmol/L (ref 3.5–5.1)
Sodium: 138 mmol/L (ref 135–145)

## 2020-04-21 LAB — TYPE AND SCREEN
ABO/RH(D): B POS
Antibody Screen: NEGATIVE

## 2020-04-21 LAB — CBC
HCT: 35.3 % — ABNORMAL LOW (ref 36.0–46.0)
Hemoglobin: 11.8 g/dL — ABNORMAL LOW (ref 12.0–15.0)
MCH: 35 pg — ABNORMAL HIGH (ref 26.0–34.0)
MCHC: 33.4 g/dL (ref 30.0–36.0)
MCV: 104.7 fL — ABNORMAL HIGH (ref 80.0–100.0)
Platelets: 224 10*3/uL (ref 150–400)
RBC: 3.37 MIL/uL — ABNORMAL LOW (ref 3.87–5.11)
RDW: 13.3 % (ref 11.5–15.5)
WBC: 20.8 10*3/uL — ABNORMAL HIGH (ref 4.0–10.5)
nRBC: 0 % (ref 0.0–0.2)

## 2020-04-21 LAB — MAGNESIUM: Magnesium: 1.6 mg/dL — ABNORMAL LOW (ref 1.7–2.4)

## 2020-04-21 SURGERY — LAPAROTOMY, EXPLORATORY
Anesthesia: General

## 2020-04-21 MED ORDER — DEXAMETHASONE SODIUM PHOSPHATE 10 MG/ML IJ SOLN
INTRAMUSCULAR | Status: DC | PRN
  Administered 2020-04-21: 4 mg via INTRAVENOUS

## 2020-04-21 MED ORDER — MIDAZOLAM HCL 5 MG/5ML IJ SOLN
INTRAMUSCULAR | Status: DC | PRN
  Administered 2020-04-21: 1 mg via INTRAVENOUS

## 2020-04-21 MED ORDER — PHENYLEPHRINE 40 MCG/ML (10ML) SYRINGE FOR IV PUSH (FOR BLOOD PRESSURE SUPPORT)
PREFILLED_SYRINGE | INTRAVENOUS | Status: DC | PRN
  Administered 2020-04-21: 100 ug via INTRAVENOUS

## 2020-04-21 MED ORDER — PANTOPRAZOLE SODIUM 40 MG IV SOLR
40.0000 mg | Freq: Two times a day (BID) | INTRAVENOUS | Status: DC
  Administered 2020-04-21 – 2020-04-28 (×16): 40 mg via INTRAVENOUS
  Filled 2020-04-21 (×16): qty 40

## 2020-04-21 MED ORDER — FENTANYL CITRATE (PF) 250 MCG/5ML IJ SOLN
INTRAMUSCULAR | Status: DC | PRN
  Administered 2020-04-21 (×3): 25 ug via INTRAVENOUS
  Administered 2020-04-21: 50 ug via INTRAVENOUS
  Administered 2020-04-21: 25 ug via INTRAVENOUS
  Administered 2020-04-21: 100 ug via INTRAVENOUS

## 2020-04-21 MED ORDER — FENTANYL CITRATE (PF) 250 MCG/5ML IJ SOLN
INTRAMUSCULAR | Status: AC
  Filled 2020-04-21: qty 5

## 2020-04-21 MED ORDER — LIDOCAINE 2% (20 MG/ML) 5 ML SYRINGE
INTRAMUSCULAR | Status: AC
  Filled 2020-04-21: qty 5

## 2020-04-21 MED ORDER — FENTANYL CITRATE (PF) 100 MCG/2ML IJ SOLN
50.0000 ug | Freq: Once | INTRAMUSCULAR | Status: AC
  Administered 2020-04-21: 50 ug via INTRAVENOUS
  Filled 2020-04-21: qty 2

## 2020-04-21 MED ORDER — SUCCINYLCHOLINE CHLORIDE 200 MG/10ML IV SOSY
PREFILLED_SYRINGE | INTRAVENOUS | Status: DC | PRN
  Administered 2020-04-21: 140 mg via INTRAVENOUS

## 2020-04-21 MED ORDER — ONDANSETRON HCL 4 MG/2ML IJ SOLN: 4.0000 mg | Freq: Once | INTRAMUSCULAR | Status: DC | PRN

## 2020-04-21 MED ORDER — DEXAMETHASONE SODIUM PHOSPHATE 10 MG/ML IJ SOLN
INTRAMUSCULAR | Status: AC
  Filled 2020-04-21: qty 1

## 2020-04-21 MED ORDER — PROPOFOL 10 MG/ML IV BOLUS
INTRAVENOUS | Status: DC | PRN
  Administered 2020-04-21: 150 mg via INTRAVENOUS

## 2020-04-21 MED ORDER — ALBUMIN HUMAN 5 % IV SOLN: INTRAVENOUS | Status: DC | PRN

## 2020-04-21 MED ORDER — ONDANSETRON HCL 4 MG/2ML IJ SOLN
INTRAMUSCULAR | Status: DC | PRN
  Administered 2020-04-21: 4 mg via INTRAVENOUS

## 2020-04-21 MED ORDER — MIDAZOLAM HCL 2 MG/2ML IJ SOLN
INTRAMUSCULAR | Status: AC
  Filled 2020-04-21: qty 2

## 2020-04-21 MED ORDER — MAGNESIUM SULFATE 2 GM/50ML IV SOLN
2.0000 g | Freq: Once | INTRAVENOUS | Status: AC
  Administered 2020-04-21: 2 g via INTRAVENOUS
  Filled 2020-04-21 (×2): qty 50

## 2020-04-21 MED ORDER — SODIUM CHLORIDE 0.9 % IR SOLN
Status: DC | PRN
  Administered 2020-04-21: 2000 mL

## 2020-04-21 MED ORDER — FENTANYL CITRATE (PF) 100 MCG/2ML IJ SOLN
25.0000 ug | INTRAMUSCULAR | Status: DC | PRN
  Administered 2020-04-21: 50 ug via INTRAVENOUS

## 2020-04-21 MED ORDER — ALBUMIN HUMAN 5 % IV SOLN
INTRAVENOUS | Status: AC
  Filled 2020-04-21: qty 250

## 2020-04-21 MED ORDER — SUCCINYLCHOLINE CHLORIDE 200 MG/10ML IV SOSY
PREFILLED_SYRINGE | INTRAVENOUS | Status: AC
  Filled 2020-04-21: qty 10

## 2020-04-21 MED ORDER — LACTATED RINGERS IV SOLN: INTRAVENOUS | Status: DC

## 2020-04-21 MED ORDER — ROCURONIUM BROMIDE 10 MG/ML (PF) SYRINGE
PREFILLED_SYRINGE | INTRAVENOUS | Status: DC | PRN
  Administered 2020-04-21: 30 mg via INTRAVENOUS

## 2020-04-21 MED ORDER — HYDROMORPHONE HCL 1 MG/ML IJ SOLN: 0.2500 mg | INTRAMUSCULAR | Status: DC | PRN

## 2020-04-21 MED ORDER — ONDANSETRON HCL 4 MG/2ML IJ SOLN
INTRAMUSCULAR | Status: AC
  Filled 2020-04-21: qty 2

## 2020-04-21 MED ORDER — PHENYLEPHRINE 40 MCG/ML (10ML) SYRINGE FOR IV PUSH (FOR BLOOD PRESSURE SUPPORT)
PREFILLED_SYRINGE | INTRAVENOUS | Status: AC
  Filled 2020-04-21: qty 10

## 2020-04-21 MED ORDER — TRAVASOL 10 % IV SOLN
INTRAVENOUS | Status: DC
  Filled 2020-04-21: qty 936

## 2020-04-21 MED ORDER — SUGAMMADEX SODIUM 200 MG/2ML IV SOLN
INTRAVENOUS | Status: DC | PRN
  Administered 2020-04-21: 107.2 mg via INTRAVENOUS

## 2020-04-21 MED ORDER — ROCURONIUM BROMIDE 10 MG/ML (PF) SYRINGE
PREFILLED_SYRINGE | INTRAVENOUS | Status: AC
  Filled 2020-04-21: qty 10

## 2020-04-21 MED ORDER — MEPERIDINE HCL 50 MG/ML IJ SOLN: 6.2500 mg | INTRAMUSCULAR | Status: DC | PRN

## 2020-04-21 MED ORDER — FENTANYL CITRATE (PF) 100 MCG/2ML IJ SOLN
INTRAMUSCULAR | Status: AC
  Filled 2020-04-21: qty 2

## 2020-04-21 SURGICAL SUPPLY — 43 items
APPLICATOR COTTON TIP 6 STRL (MISCELLANEOUS) ×1 IMPLANT
APPLICATOR COTTON TIP 6IN STRL (MISCELLANEOUS) ×2 IMPLANT
BLADE EXTENDED COATED 6.5IN (ELECTRODE) IMPLANT
BLADE HEX COATED 2.75 (ELECTRODE) ×2 IMPLANT
CANISTER WOUNDNEG PRESSURE 500 (CANNISTER) ×1 IMPLANT
COVER MAYO STAND STRL (DRAPES) IMPLANT
COVER WAND RF STERILE (DRAPES) IMPLANT
DRAPE LAPAROSCOPIC ABDOMINAL (DRAPES) ×2 IMPLANT
DRAPE WARM FLUID 44X44 (DRAPES) IMPLANT
DRSG EMULSION OIL 3X3 NADH (GAUZE/BANDAGES/DRESSINGS) ×1 IMPLANT
DRSG VAC ATS MED SENSATRAC (GAUZE/BANDAGES/DRESSINGS) ×1 IMPLANT
ELECT REM PT RETURN 15FT ADLT (MISCELLANEOUS) ×2 IMPLANT
GAUZE SPONGE 4X4 12PLY STRL (GAUZE/BANDAGES/DRESSINGS) ×2 IMPLANT
GLOVE BIO SURGEON STRL SZ7.5 (GLOVE) ×4 IMPLANT
GLOVE BIOGEL PI IND STRL 7.0 (GLOVE) ×1 IMPLANT
GLOVE BIOGEL PI INDICATOR 7.0 (GLOVE) ×1
GOWN STRL REUS W/ TWL XL LVL3 (GOWN DISPOSABLE) ×1 IMPLANT
GOWN STRL REUS W/TWL LRG LVL3 (GOWN DISPOSABLE) ×2 IMPLANT
GOWN STRL REUS W/TWL XL LVL3 (GOWN DISPOSABLE) ×3 IMPLANT
HANDLE SUCTION POOLE (INSTRUMENTS) IMPLANT
KIT BASIN (CUSTOM PROCEDURE TRAY) ×2 IMPLANT
KIT TURNOVER KIT A (KITS) IMPLANT
MATRIX SURGICAL PSMT 20X30CM (Tissue) ×1 IMPLANT
NS IRRIG 1000ML POUR BTL (IV SOLUTION) ×2 IMPLANT
PACK GENERAL/GYN (CUSTOM PROCEDURE TRAY) ×2 IMPLANT
PENCIL SMOKE EVACUATOR (MISCELLANEOUS) IMPLANT
RETAINER VISCERA MED (MISCELLANEOUS) ×1 IMPLANT
SPONGE LAP 18X18 RF (DISPOSABLE) IMPLANT
STAPLER VISISTAT 35W (STAPLE) ×2 IMPLANT
SUCTION POOLE HANDLE (INSTRUMENTS)
SUT ETHILON 4 0 PS 2 18 (SUTURE) ×2 IMPLANT
SUT PDS AB 0 CTX 36 PDP370T (SUTURE) ×11 IMPLANT
SUT PDS AB 1 CTX 36 (SUTURE) IMPLANT
SUT SILK 2 0 (SUTURE)
SUT SILK 2 0 SH CR/8 (SUTURE) IMPLANT
SUT SILK 2-0 18XBRD TIE 12 (SUTURE) IMPLANT
SUT SILK 3 0 (SUTURE)
SUT SILK 3 0 SH CR/8 (SUTURE) IMPLANT
SUT SILK 3-0 18XBRD TIE 12 (SUTURE) IMPLANT
TOWEL OR 17X26 10 PK STRL BLUE (TOWEL DISPOSABLE) ×4 IMPLANT
TRAY FOLEY MTR SLVR 16FR STAT (SET/KITS/TRAYS/PACK) IMPLANT
WATER STERILE IRR 1000ML POUR (IV SOLUTION) ×2 IMPLANT
YANKAUER SUCT BULB TIP NO VENT (SUCTIONS) IMPLANT

## 2020-04-21 NOTE — H&P (View-Only) (Signed)
Patient ID: Yolanda Watson, female   DOB: 11/10/1951, 69 y.o.   MRN: [id number]    2 Days Post-Op  Subjective: Patient doesn't feel well today.  Plans to return to OR today  ROS: See above, otherwise other systems negative  Objective: Vital signs in last 24 hours: Temp:  [97.5 F (36.4 C)-98.6 F (37 C)] 98.6 F (37 C) (04/23 0000) Pulse Rate:  [66-82] 77 (04/23 0600) Resp:  [10-20] 14 (04/23 0600) BP: (104-140)/(51-71) 126/66 (04/23 0600) SpO2:  [88 %-100 %] 90 % (04/23 0600) Last BM Date: 04/13/20  Intake/Output from previous day: 04/22 0701 - 04/23 0700 In: 2573.3 [I.V.:1780; IV Piggyback:793.3] Out: 2700 [Urine:950; Emesis/NG output:1450; Drains:300] Intake/Output this shift: No intake/output data recorded.  PE: Abd: soft, very tender to palpation diffusely, NGT in place with bilious output, ABTHERA vac in place with serosang output  Lab Results:  Recent Labs    04/20/20 0507  WBC 16.8*  HGB 11.5*  HCT 35.6*  PLT 148*   BMET Recent Labs    04/20/20 0507 04/21/20 0448  NA 136  136 138  K 4.5  4.5 4.5  CL 101  101 103  CO2 27  27 27   GLUCOSE 257*  257* 128*  BUN 21  21 20   CREATININE 0.58  0.65 0.51  CALCIUM 7.0*  6.9* 7.7*   PT/INR No results for input(s): LABPROT, INR in the last 72 hours. CMP     Component Value Date/Time   NA 138 04/21/2020 0448   NA 141 01/15/2018 0000   NA 138 08/07/2017 0835   K 4.5 04/21/2020 0448   K 4.1 08/07/2017 0835   CL 103 04/21/2020 0448   CO2 27 04/21/2020 0448   CO2 25 08/07/2017 0835   GLUCOSE 128 (H) 04/21/2020 0448   GLUCOSE 95 08/07/2017 0835   BUN 20 04/21/2020 0448   BUN 4 01/15/2018 0000   BUN 9.3 08/07/2017 0835   CREATININE 0.51 04/21/2020 0448   CREATININE 0.80 03/16/2020 1002   CREATININE 0.57 02/05/2018 1144   CREATININE 0.7 08/07/2017 0835   CALCIUM 7.7 (L) 04/21/2020 0448   CALCIUM 9.4 08/07/2017 0835   PROT 4.8 (L) 04/20/2020 0507   PROT 7.3 08/07/2017 0835   ALBUMIN 2.2 (L)  04/21/2020 0448   ALBUMIN 3.4 (L) 08/07/2017 0835   AST 10 (L) 04/20/2020 0507   AST 13 (L) 03/16/2020 1002   AST 25 08/07/2017 0835   ALT 9 04/20/2020 0507   ALT 11 03/16/2020 1002   ALT 21 08/07/2017 0835   ALKPHOS 56 04/20/2020 0507   ALKPHOS 138 08/07/2017 0835   BILITOT 0.3 04/20/2020 0507   BILITOT 0.4 03/16/2020 1002   BILITOT 0.49 08/07/2017 0835   GFRNONAA >60 04/21/2020 0448   GFRNONAA >60 03/16/2020 1002   GFRAA >60 04/21/2020 0448   GFRAA >60 03/16/2020 1002   Lipase     Component Value Date/Time   LIPASE 59 (H) 04/11/2020 1542       Studies/Results: DG CHEST PORT 1 VIEW  Result Date: 04/20/2020 CLINICAL DATA:  69 year old female with shortness of breath, weakness and nausea. Metastatic colon cancer. EXAM: PORTABLE CHEST 1 VIEW COMPARISON:  Chest radiographs 08/20/2019. CT Abdomen and Pelvis 04/11/2020. FINDINGS: Portable AP semi upright view at 0917 hours. Stable right chest power port, accessed. An enteric tube courses to the abdomen, side hole is at the level of the gastric cardia. Lower lung volumes. Crowding of markings at both lung bases. No pneumothorax, pulmonary edema, or confluent  pulmonary opacity. Mediastinal contours remain normal. Visualized tracheal air column is within normal limits. Stable visualized osseous structures. IMPRESSION: 1. Low lung volumes, otherwise no acute cardiopulmonary abnormality. 2. Enteric tube placed into the stomach, side hole at the level of the gastric cardia. Electronically Signed   By: Genevie Ann M.D.   On: 04/20/2020 12:58    Anti-infectives: Anti-infectives (From admission, onward)   Start     Dose/Rate Route Frequency Ordered Stop   04/20/20 1000  ciprofloxacin (CIPRO) IVPB 400 mg     400 mg 200 mL/hr over 60 Minutes Intravenous Every 12 hours 04/20/20 0856     04/20/20 1000  metroNIDAZOLE (FLAGYL) IVPB 500 mg     500 mg 100 mL/hr over 60 Minutes Intravenous Every 8 hours 04/20/20 0856     04/19/20 0600  ciprofloxacin  (CIPRO) IVPB 400 mg     400 mg 200 mL/hr over 60 Minutes Intravenous On call to O.R. 04/18/20 1449 04/19/20 1200       Assessment/Plan Chronic tobacco use Abdominal wall thrombus-on Xarelto Hx EtOH abuse AKI Malnutrition - prealbumin 14.7 (4/16), on TNA  Metastatic colon cancerwith right hemicolectomy 2016 - ongoing chemotherapy, followed by Dr. Benay Spice -Excision of malignant mass from the abdominal including skin, muscle, fascia and peritoneum(15 x 12cm), abdominal reconstruction with [name], 10/03/17 Dr. Barry Dienes and [name]   POD 2, s/p ex lap with SBR for contained perforation from SBO -cont ABTHERA vac today.  return to OR today for abdominal closure and washout -cont NGT today -minimize mobilization today as she has an open abdomen currently. -cont abx therapy for perforation of SB  FEN -NPO/NGT/IVFs VTE -Lovenox ID -cipro/Flagyl 4/22 -->   LOS: 10 days    Henreitta Cea , [facility] Surgery 04/21/2020, 9:15 AM Please see Amion for pager number during day hours 7:00am-4:30pm or 7:00am -11:30am on weekends

## 2020-04-21 NOTE — Anesthesia Preprocedure Evaluation (Addendum)
Anesthesia Evaluation  Patient identified by MRN, date of birth, ID band Patient awake    Reviewed: Allergy & Precautions, NPO status , Patient's Chart, lab work & pertinent test results  Airway Mallampati: I  TM Distance: >3 FB Neck ROM: Full    Dental  (+) Teeth Intact, Dental Advisory Given   Pulmonary Current Smoker and Patient abstained from smoking.,    Pulmonary exam normal        Cardiovascular Normal cardiovascular exam     Neuro/Psych    GI/Hepatic (+)     substance abuse  alcohol use,   Endo/Other    Renal/GU      Musculoskeletal   Abdominal   Peds  Hematology  (+) Blood dyscrasia (Hgb 11.8), ,   Anesthesia Other Findings H/o colon CA. Underwent lysis of adhesions on 04/19/2020 now with acute wound dehiscience  Reproductive/Obstetrics                            Anesthesia Physical Anesthesia Plan  ASA: III  Anesthesia Plan: General   Post-op Pain Management:    Induction: Intravenous, Rapid sequence and Cricoid pressure planned  PONV Risk Score and Plan: 2 and Ondansetron, Midazolam and Treatment may vary due to age or medical condition  Airway Management Planned: Oral ETT  Additional Equipment:   Intra-op Plan:   Post-operative Plan: Extubation in OR  Informed Consent: I have reviewed the patients History and Physical, chart, labs and discussed the procedure including the risks, benefits and alternatives for the proposed anesthesia with the patient or authorized representative who has indicated his/her understanding and acceptance.   Patient has DNR.  Discussed DNR with patient and Suspend DNR.     Plan Discussed with: CRNA and Surgeon  Anesthesia Plan Comments:         Anesthesia Quick Evaluation

## 2020-04-21 NOTE — Transfer of Care (Signed)
Immediate Anesthesia Transfer of Care Note  Patient: Yolanda Watson  Procedure(s) Performed: Procedure(s): EXPLORATORY LAPAROTOMY (N/A)  Patient Location: PACU  Anesthesia Type:General  Level of Consciousness:  sedated, patient cooperative and responds to stimulation  Airway & Oxygen Therapy:Patient Spontanous Breathing and Patient connected to face mask oxgen  Post-op Assessment:  Report given to PACU RN and Post -op Vital signs reviewed and stable  Post vital signs:  Reviewed and stable  Last Vitals:  Vitals:   04/21/20 1100 04/21/20 1518  BP: 123/64 (!) 156/77  Pulse: 84 (!) 101  Resp: 18 20  Temp:    SpO2: [id number]    Complications: No apparent anesthesia complications

## 2020-04-21 NOTE — Anesthesia Procedure Notes (Signed)
Procedure Name: Intubation Date/Time: 04/21/2020 1:32 PM Performed by: Anne Fu, CRNA Pre-anesthesia Checklist: Patient identified, Emergency Drugs available, Suction available, Patient being monitored and Timeout performed Patient Re-evaluated:Patient Re-evaluated prior to induction Oxygen Delivery Method: Circle system utilized Preoxygenation: Pre-oxygenation with 100% oxygen Induction Type: IV induction, Rapid sequence and Cricoid Pressure applied Laryngoscope Size: Mac and 4 Grade View: Grade I Tube type: Oral Tube size: 7.5 mm Number of attempts: 1 Airway Equipment and Method: Stylet Placement Confirmation: ETT inserted through vocal cords under direct vision,  positive ETCO2 and breath sounds checked- equal and bilateral Secured at: 20 cm Tube secured with: Tape Dental Injury: Teeth and Oropharynx as per pre-operative assessment

## 2020-04-21 NOTE — Progress Notes (Addendum)
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  Patient was admitted to the hospital for further evaluation and treatment.  Patient failed to improve with medical management, she underwent oratory laparotomy, repair of multiple serosal tears, small bowel resection, lysis of adhesion, application of a wound VAC on 4/24.   Assessment & Plan:   Principal Problem:   SBO (small bowel obstruction) (HCC) Active Problems:   Abdominal pain   Nausea & vomiting   Acute hyponatremia   AKI (acute kidney injury) ([place])   Leukocytosis   1-Small bowel obstruction: with perforation.  -Underwent exploratory laparotomy, repair of multiple serosal tears, small bowel resection, lysis of adhesion and application of wound VAC on 4/21st. -N.p.o., still with NG tube in place. -Surgery planning to take patient back to the OR today  to try to close her abdominal wall wound. -Continue with TPN. -Surgery using Cipro for prophylaxis.   2- Coffee Ground fluid from NG tube;  Start IV protonix.  Check HB.  Nurse to inform surgery  Hold Lovenox.   3-Acute hypoxic respiratory failure: Postop, and use of opioids. Chest X ray: Showed low lung volumes. Incentive spirometry. Wean off of oxygen as tolerated. Of NB mask Oxygen 88--90 on RA.  Change  morphine every 2 hour PRN  4-AKI: During this admission up to 1.6. Improved with IV fluids.  Metabolic alkalosis: Secondary to GI losses. On IV Fluids  Hypokalemia: Resolved.  Leukocytosis: Related to #1  Metastatatic colon cancer: Follow with Dr Benay Spice.   Pancreatic duct dilation Incidental finding on CT abdomen/pelvis. No mass lesion noted.  Patient is currently asymptomatic. Lipase minimally elevated.  Need to monitor as outpatient.  Abdominal wall thrombosis: Was on Xarelto as an outpatient. resume when okay by surgery Hyponatremia: Resolved  with IV fluids Hypomagnesemia; Getting IV magnesium. Nutrition Problem: Inadequate oral intake Etiology: inability to eat    Signs/Symptoms: NPO status    Interventions: TPN  Estimated body mass index is 19.07 kg/m as calculated from the following:   Height as of this encounter: 5\' 6"  (1.676 m).   Weight as of this encounter: 53.6 kg.   DVT prophylaxis: Lovenox Code Status: DNR Family Communication: Husband at bedside Disposition Plan:  Patient is from: Home  Anticipated d/c date: 4--5 days depend on recovery from surgery  Barriers to d/c or necessity for inpatient status: needs repeat Sx for wound closure tomorrow. Not tolerating diet.   Consultants:   Surgery  Procedures:  t exploratory laparotomy, repair of multiple serosal tears, small bowel resection, lysis of adhesion and application of wound VAC on 4/21st.    Antimicrobials:    Subjective: She report abdominal pain, would like something to drink.  Denies dyspnea.   Objective: Vitals:   04/21/20 0000 04/21/20 0400 04/21/20 0500 04/21/20 0600  BP: 123/71 (!) 113/57 (!) 132/58 126/66  Pulse: 73 74 81 77  Resp: 20 12 20 14   Temp: 98.6 F (37 C)     TempSrc: Oral     SpO2: 99% 91% 90% 90%  Weight:      Height:        Intake/Output Summary (Last 24 hours) at 04/21/2020 0742 Last data filed at 04/21/2020 0645 Gross per 24  hour  Intake 2573.26 ml  Output 2700 ml  Net -126.74 ml   Filed Weights   04/17/20 1143 04/19/20 0500 04/20/20 0500  Weight: 49.5 kg 52.7 kg 53.6 kg    Examination:  General exam: NAD Respiratory system: CTA. Cardiovascular system: S 1, S 2 RRR Gastrointestinal system:BS decreased, wound vac in place Central nervous system: alert, following command. Extremities:  Symmetric power.      Data Reviewed: I have personally reviewed following labs and imaging studies  CBC: Recent Labs  Lab 04/15/20 0303 04/16/20 0138 04/17/20 0140 04/18/20 0456 04/20/20 0507  WBC 18.7* 14.9* 14.9* 11.1* 16.8*  NEUTROABS  --   --   --  9.0*  --   HGB 13.1 12.2 12.4 13.2 11.5*  HCT 40.3 37.9 38.1 41.7 35.6*  MCV 104.9* 105.9* 107.3* 106.4* 106.9*  PLT 252 242 255 260 123456*   Basic Metabolic Panel: Recent Labs  Lab 04/17/20 0140 04/18/20 0456 04/19/20 0540 04/20/20 0507 04/21/20 0448  NA 139 140  139 138  137 136  136 138  K 3.4* 3.8  3.8 3.4*  3.4* 4.5  4.5 4.5  CL 89* 92*  91* 97*  96* 101  101 103  CO2 33* 38*  36* 29  30 27  27 27   GLUCOSE 93 175*  173* 163*  164* 257*  257* 128*  BUN 15 19  18 22  23 21  21 20   CREATININE 0.83 0.70  0.76 0.65  0.62 0.58  0.65 0.51  CALCIUM 8.1* 8.3*  8.3* 8.1*  8.1* 7.0*  6.9* 7.7*  MG 1.6* 2.4 1.8 1.7 1.6*  PHOS 2.5 2.6  2.5 2.0*  2.0* 3.7  3.7 2.5   GFR: Estimated Creatinine Clearance: 57.7 mL/min (by C-G formula based on SCr of 0.51 mg/dL). Liver Function Tests: Recent Labs  Lab 04/17/20 0140 04/18/20 0456 04/19/20 0540 04/20/20 0507 04/21/20 0448  AST  --  12*  --  10*  --   ALT  --  9  --  9  --   ALKPHOS  --  58  --  56  --   BILITOT  --  0.6  --  0.3  --   PROT  --  5.8*  --  4.8*  --   ALBUMIN 3.0* 3.0*  2.9* 2.5* 2.2*  2.2* 2.2*   No results for input(s): LIPASE, AMYLASE in the last 168 hours. No results for input(s): AMMONIA in the last 168 hours. Coagulation Profile: No results for input(s): INR, PROTIME in the last 168 hours. Cardiac Enzymes: No results for input(s): CKTOTAL, CKMB, CKMBINDEX, TROPONINI in the last 168 hours. BNP (last 3 results) No results for input(s): PROBNP in the last 8760 hours. HbA1C: No results for input(s): HGBA1C in the last 72 hours. CBG: Recent Labs  Lab 04/20/20 0406 04/20/20 1215 04/20/20 1813 04/20/20 2334 04/21/20 0548   GLUCAP 230* 150* 119* 149* 115*   Lipid Profile: No results for input(s): CHOL, HDL, LDLCALC, TRIG, CHOLHDL, LDLDIRECT in the last 72 hours. Thyroid Function Tests: No results for input(s): TSH, T4TOTAL, FREET4, T3FREE, THYROIDAB in the last 72 hours. Anemia Panel: No results for input(s): VITAMINB12, FOLATE, FERRITIN, TIBC, IRON, RETICCTPCT in the last 72 hours. Sepsis Labs: No results for input(s): PROCALCITON, LATICACIDVEN in the last 168 hours.  Recent Results (from the past 240 hour(s))  Respiratory Panel by RT PCR (Flu A&B, Covid) - Nasopharyngeal Swab     Status: None   Collection Time: 04/11/20  6:49 PM   Specimen: Nasopharyngeal Swab  Result Value Ref Range Status   SARS Coronavirus 2 by RT PCR NEGATIVE NEGATIVE Final    Comment: (NOTE) SARS-CoV-2 target nucleic acids are NOT DETECTED. The SARS-CoV-2 RNA is generally detectable in upper respiratoy specimens during the acute phase of infection. The lowest concentration of SARS-CoV-2 viral copies this assay can detect is 131 copies/mL. A negative result does not preclude SARS-Cov-2 infection and should not be used as the sole basis for treatment or other patient management decisions. A negative result may occur with  improper specimen collection/handling, submission of specimen other than nasopharyngeal swab, presence of viral mutation(s) within the areas targeted by this assay, and inadequate number of viral copies (<131 copies/mL). A negative result must be combined with clinical observations, patient history, and epidemiological information. The expected result is Negative. Fact Sheet for Patients:  PinkCheek.be Fact Sheet for Healthcare Providers:  GravelBags.it This test is not yet ap proved or cleared by the [place] FDA and  has been authorized for detection and/or diagnosis of SARS-CoV-2 by FDA under an Emergency Use Authorization (EUA). This EUA will  remain  in effect (meaning this test can be used) for the duration of the COVID-19 declaration under Section 564(b)(1) of the Act, 21 U.S.C. section 360bbb-3(b)(1), unless the authorization is terminated or revoked sooner.    Influenza A by PCR NEGATIVE NEGATIVE Final   Influenza B by PCR NEGATIVE NEGATIVE Final    Comment: (NOTE) The Xpert Xpress SARS-CoV-2/FLU/RSV assay is intended as an aid in  the diagnosis of influenza from Nasopharyngeal swab specimens and  should not be used as a sole basis for treatment. Nasal washings and  aspirates are unacceptable for Xpert Xpress SARS-CoV-2/FLU/RSV  testing. Fact Sheet for Patients: PinkCheek.be Fact Sheet for Healthcare Providers: GravelBags.it This test is not yet approved or cleared by the [place] FDA and  has been authorized for detection and/or diagnosis of SARS-CoV-2 by  FDA under an Emergency Use Authorization (EUA). This EUA will remain  in effect (meaning this test can be used) for the duration of the  Covid-19 declaration under Section 564(b)(1) of the Act, 21  U.S.C. section 360bbb-3(b)(1), unless the authorization is  terminated or revoked. Performed at [facility], [place] [address] [place], [place] [address]   Surgical pcr screen     Status: None   Collection Time: 04/19/20  2:13 AM   Specimen: Nasal Mucosa; Nasal Swab  Result Value Ref Range Status   MRSA, PCR NEGATIVE NEGATIVE Final   Staphylococcus aureus NEGATIVE NEGATIVE Final    Comment: (NOTE) The Xpert SA Assay (FDA approved for NASAL specimens in patients 6 years of age and older), is one component of a comprehensive surveillance program. It is not intended to diagnose infection nor to guide or monitor treatment. Performed at [facility], [place] [address] [place], [place] [address]          Radiology Studies: DG CHEST PORT 1 VIEW  Result Date:  04/20/2020 CLINICAL DATA:  69 year old female with shortness of breath, weakness and nausea. Metastatic colon cancer. EXAM: PORTABLE CHEST 1 VIEW COMPARISON:  Chest radiographs 08/20/2019. CT Abdomen and Pelvis 04/11/2020. FINDINGS: Portable AP semi upright view at 0917 hours. Stable right chest power port, accessed. An enteric tube courses to the abdomen, side hole is at the level of the gastric cardia. Lower lung volumes. Crowding of markings at both lung bases. No pneumothorax, pulmonary edema, or confluent pulmonary opacity. Mediastinal  contours remain normal. Visualized tracheal air column is within normal limits. Stable visualized osseous structures. IMPRESSION: 1. Low lung volumes, otherwise no acute cardiopulmonary abnormality. 2. Enteric tube placed into the stomach, side hole at the level of the gastric cardia. Electronically Signed   By: Genevie Ann M.D.   On: 04/20/2020 12:58        Scheduled Meds: . Chlorhexidine Gluconate Cloth  6 each Topical Daily  . enoxaparin (LOVENOX) injection  40 mg Subcutaneous Q24H  . insulin aspart  0-9 Units Subcutaneous Q6H  . mouth rinse  15 mL Mouth Rinse BID  . sodium chloride flush  10-40 mL Intracatheter Q12H  . sodium chloride flush  3 mL Intravenous Q12H   Continuous Infusions: . sodium chloride Stopped (04/20/20 1750)  . ciprofloxacin Stopped (04/20/20 2332)  . magnesium sulfate bolus IVPB    . metronidazole Stopped (04/21/20 0250)  . TPN ADULT (ION) 60 mL/hr at 04/21/20 0600     LOS: 10 days    Time spent: 35 minutes.     Elmarie Shiley, MD [facility]   If 7PM-7AM, please contact night-coverage [email or web address]  04/21/2020, 7:42 AM

## 2020-04-21 NOTE — Interval H&P Note (Signed)
History and Physical Interval Note:  04/21/2020 12:47 PM  Yolanda Watson  has presented today for surgery, with the diagnosis of Abdominal wall defect.  The various methods of treatment have been discussed with the patient and family. After consideration of risks, benefits and other options for treatment, the patient has consented to  Procedure(s): EXPLORATORY LAPAROTOMY (N/A) as a surgical intervention.  The patient's history has been reviewed, patient examined, no change in status, stable for surgery.  I have reviewed the patient's chart and labs.  Questions were answered to the patient's satisfaction.     Autumn Messing III

## 2020-04-21 NOTE — Progress Notes (Signed)
Patient ID: Yolanda Watson, female   DOB: [birth year], 69 y.o.   MRN: [id number]    2 Days Post-Op  Subjective: Patient doesn't feel well today.  Plans to return to OR today  ROS: See above, otherwise other systems negative  Objective: Vital signs in last 24 hours: Temp:  [97.5 F (36.4 C)-98.6 F (37 C)] 98.6 F (37 C) (04/23 0000) Pulse Rate:  [66-82] 77 (04/23 0600) Resp:  [10-20] 14 (04/23 0600) BP: (104-140)/(51-71) 126/66 (04/23 0600) SpO2:  [88 %-100 %] 90 % (04/23 0600) Last BM Date: 04/13/20  Intake/Output from previous day: 04/22 0701 - 04/23 0700 In: 2573.3 [I.V.:1780; IV Piggyback:793.3] Out: 2700 [Urine:950; Emesis/NG output:1450; Drains:300] Intake/Output this shift: No intake/output data recorded.  PE: Abd: soft, very tender to palpation diffusely, NGT in place with bilious output, ABTHERA vac in place with serosang output  Lab Results:  Recent Labs    04/20/20 0507  WBC 16.8*  HGB 11.5*  HCT 35.6*  PLT 148*   BMET Recent Labs    04/20/20 0507 04/21/20 0448  NA 136  136 138  K 4.5  4.5 4.5  CL 101  101 103  CO2 27  27 27   GLUCOSE 257*  257* 128*  BUN 21  21 20   CREATININE 0.58  0.65 0.51  CALCIUM 7.0*  6.9* 7.7*   PT/INR No results for input(s): LABPROT, INR in the last 72 hours. CMP     Component Value Date/Time   NA 138 04/21/2020 0448   NA 141 01/15/2018 0000   NA 138 08/07/2017 0835   K 4.5 04/21/2020 0448   K 4.1 08/07/2017 0835   CL 103 04/21/2020 0448   CO2 27 04/21/2020 0448   CO2 25 08/07/2017 0835   GLUCOSE 128 (H) 04/21/2020 0448   GLUCOSE 95 08/07/2017 0835   BUN 20 04/21/2020 0448   BUN 4 01/15/2018 0000   BUN 9.3 08/07/2017 0835   CREATININE 0.51 04/21/2020 0448   CREATININE 0.80 03/16/2020 1002   CREATININE 0.57 02/05/2018 1144   CREATININE 0.7 08/07/2017 0835   CALCIUM 7.7 (L) 04/21/2020 0448   CALCIUM 9.4 08/07/2017 0835   PROT 4.8 (L) 04/20/2020 0507   PROT 7.3 08/07/2017 0835   ALBUMIN 2.2 (L)  04/21/2020 0448   ALBUMIN 3.4 (L) 08/07/2017 0835   AST 10 (L) 04/20/2020 0507   AST 13 (L) 03/16/2020 1002   AST 25 08/07/2017 0835   ALT 9 04/20/2020 0507   ALT 11 03/16/2020 1002   ALT 21 08/07/2017 0835   ALKPHOS 56 04/20/2020 0507   ALKPHOS 138 08/07/2017 0835   BILITOT 0.3 04/20/2020 0507   BILITOT 0.4 03/16/2020 1002   BILITOT 0.49 08/07/2017 0835   GFRNONAA >60 04/21/2020 0448   GFRNONAA >60 03/16/2020 1002   GFRAA >60 04/21/2020 0448   GFRAA >60 03/16/2020 1002   Lipase     Component Value Date/Time   LIPASE 59 (H) 04/11/2020 1542       Studies/Results: DG CHEST PORT 1 VIEW  Result Date: 04/20/2020 CLINICAL DATA:  69 year old female with shortness of breath, weakness and nausea. Metastatic colon cancer. EXAM: PORTABLE CHEST 1 VIEW COMPARISON:  Chest radiographs 08/20/2019. CT Abdomen and Pelvis 04/11/2020. FINDINGS: Portable AP semi upright view at 0917 hours. Stable right chest power port, accessed. An enteric tube courses to the abdomen, side hole is at the level of the gastric cardia. Lower lung volumes. Crowding of markings at both lung bases. No pneumothorax, pulmonary edema, or confluent  pulmonary opacity. Mediastinal contours remain normal. Visualized tracheal air column is within normal limits. Stable visualized osseous structures. IMPRESSION: 1. Low lung volumes, otherwise no acute cardiopulmonary abnormality. 2. Enteric tube placed into the stomach, side hole at the level of the gastric cardia. Electronically Signed   By: Genevie Ann M.D.   On: 04/20/2020 12:58    Anti-infectives: Anti-infectives (From admission, onward)   Start     Dose/Rate Route Frequency Ordered Stop   04/20/20 1000  ciprofloxacin (CIPRO) IVPB 400 mg     400 mg 200 mL/hr over 60 Minutes Intravenous Every 12 hours 04/20/20 0856     04/20/20 1000  metroNIDAZOLE (FLAGYL) IVPB 500 mg     500 mg 100 mL/hr over 60 Minutes Intravenous Every 8 hours 04/20/20 0856     04/19/20 0600  ciprofloxacin  (CIPRO) IVPB 400 mg     400 mg 200 mL/hr over 60 Minutes Intravenous On call to O.R. 04/18/20 1449 04/19/20 1200       Assessment/Plan Chronic tobacco use Abdominal wall thrombus-on Xarelto Hx EtOH abuse AKI Malnutrition - prealbumin 14.7 (4/16), on TNA  Metastatic colon cancerwith right hemicolectomy 2016 - ongoing chemotherapy, followed by Dr. Benay Spice -Excision of malignant mass from the abdominal including skin, muscle, fascia and peritoneum(15 x 12cm), abdominal reconstruction with [name], 10/03/17 Dr. Barry Dienes and [name]   POD 2, s/p ex lap with SBR for contained perforation from SBO -cont ABTHERA vac today.  return to OR today for abdominal closure and washout -cont NGT today -minimize mobilization today as she has an open abdomen currently. -cont abx therapy for perforation of SB  FEN -NPO/NGT/IVFs VTE -Lovenox ID -cipro/Flagyl 4/22 -->   LOS: 10 days    Henreitta Cea , [facility] Surgery 04/21/2020, 9:15 AM Please see Amion for pager number during day hours 7:00am-4:30pm or 7:00am -11:30am on weekends

## 2020-04-21 NOTE — Op Note (Signed)
04/21/2020  3:07 PM  PATIENT:  Yolanda Watson  68 y.o. female  PRE-OPERATIVE DIAGNOSIS:  Abdominal wall defect, open abdomen  POST-OPERATIVE DIAGNOSIS:  Abdominal wall defect, open abdomen  PROCEDURE:  Procedure(s): EXPLORATORY LAPAROTOMY, CLOSURE OF ABDOMEN WITH ACEL GENTRIX THICK [id number], APPLICATION OF WOUND VAC  SURGEON:  Surgeon(s) and Role:    * Jovita Kussmaul, MD - Primary    * Alphonsa Overall, MD - Assisting  PHYSICIAN ASSISTANT:   ASSISTANTS: Dr. Lucia Gaskins   ANESTHESIA:   general  EBL:  minimal   BLOOD ADMINISTERED:none  DRAINS: none   LOCAL MEDICATIONS USED:  NONE  SPECIMEN:  No Specimen  DISPOSITION OF SPECIMEN:  N/A  COUNTS:  YES  TOURNIQUET:  * No tourniquets in log *  DICTATION: .Dragon Dictation   After informed consent was obtained the patient was brought to the operating room and placed in the supine position on the operating table.  After adequate induction of general anesthesia the patient's abdomen was prepped with Betadine and draped in usual sterile manner.  An appropriate timeout was performed.  The previously placed vacuum dressing was removed.  The bowel appeared to be pink and healthy.  The abdominal cavity was relatively clean.  The anastomosis was seen and appeared intact.  At this point in order to try to gain closure of the abdomen we elected to use a 20x30cm thick piece of acel gentrix biologic.  I was able to sew this circumferentially to a substantial layer of the abdominal wall with interrupted 0 PDS stitches circumferentially.  Once this was accomplished the biologic appeared to be laying in good position and there were no gaps between the stitches or redundancy of the biologic.  I then held some of the lower skin together with three 4-0 nylon stitches.  I then placed a piece of black sponge along the upper portion of the incision and covered the lower portion of the incision where the biologic was exposed with a piece of Adaptic.  I then applied a  vacuum dressing to the area.  There was a good seal.  The vacuum device was placed to 100 mmHg suction.  The patient tolerated the procedure well.  At the end of the case all needle sponge and instrument counts were correct.  The patient was then extubated and taken to recovery in stable condition.  The assistant was essential for completion of the case.  PLAN OF CARE: Admit to inpatient   PATIENT DISPOSITION:  ICU - extubated and stable.   Delay start of Pharmacological VTE agent (>24hrs) due to surgical blood loss or risk of bleeding: no

## 2020-04-21 NOTE — Progress Notes (Signed)
PHARMACY - TOTAL PARENTERAL NUTRITION CONSULT NOTE   Indication: small bowel obstruction  Patient Measurements: Height: 5\' 6"  (167.6 cm) Weight: 53.6 kg (118 lb 2.7 oz) IBW/kg (Calculated) : 59.3   Body mass index is 19.07 kg/m.  Assessment: 71 y/oF with PMH of metastatic colon cancer, abdominal wall thrombus who presented on 04/11/2020 secondary to abdominal pain and found to have SBO. Pharmacy consulted for TPN.   Glucose / Insulin: No hx of DM, CBG at goal <150. 1 unit SSI given/24 hrs. Electrolytes:  Mag low at 1.6; K, Phos Na, CorrCa WNL.  Chloride, CO2 WNL. Renal: SCr/BUN WNL, stable LFTs / TGs: LFTs WNL (4/14)/Triglycerides 99 (4/20) Prealbumin / albumin: 14.7 (4/16), < 5 (4/20)  Intake / Output; MIVF: I/O net: +1216 mL. NG/drain output 1300 mL. D5NS at 100 mL/hr GI Imaging:  -4/13 CT abd/pelvis: SBO with transition point in the mid abdomen -4/19 AXR: Mild improvement in the pattern of partial small bowel obstruction with interval evidence of mucosal thickening involving the dilated loops of jejunum without pneumatosis.  Surgeries / Procedures:  -4/21: Ex lap with small bowel resection & wound vac -4/23: Plan abdominal closure & washout  Central access: port  TPN start date: 04/17/2020  Nutritional Goals: per RD recommendation on 4/20: Kcal: 1735 - 1900  Protein: 85-95 g Fluid: >/= 1.8 L/day  Custom TPN at goal rate of 75 mL/hr will provide 94 g protein, 270 g dextrose, 1832 kcal; meeting 100% of patient needs.   Current Nutrition:  -TPN -Diet: NPO except ice chips, sips of liquid for comfort  Plan:  Now: -Magnesium 2gm IV x1  At 1800:  Increase TPN to 75 mL/hr  Electrolytes in TPN:   61mEq/L of Na, 10mEq/L of K, 71mEq/L of Ca, 62mEq/L of Mag, and 38mmol/L of Phos.   Cl:Ac ratio - max chloride  Standard MVI and trace elements to TPN.   Continue Sensitive q6h SSI and adjust as needed.    Continue IVF at 100 ml/hr or per MD  Monitor TPN labs on  Mon/Thurs. Recheck electrolytes with AM labs tomorrow.   Netta Cedars, PharmD 04/21/20 @ 11:14 AM

## 2020-04-22 ENCOUNTER — Inpatient Hospital Stay (HOSPITAL_COMMUNITY): Payer: Medicare Other

## 2020-04-22 ENCOUNTER — Inpatient Hospital Stay: Payer: Medicare Other

## 2020-04-22 DIAGNOSIS — K56609 Unspecified intestinal obstruction, unspecified as to partial versus complete obstruction: Secondary | ICD-10-CM | POA: Diagnosis not present

## 2020-04-22 LAB — CBC
HCT: 38.3 % (ref 36.0–46.0)
Hemoglobin: 12 g/dL (ref 12.0–15.0)
MCH: 33.4 pg (ref 26.0–34.0)
MCHC: 31.3 g/dL (ref 30.0–36.0)
MCV: 106.7 fL — ABNORMAL HIGH (ref 80.0–100.0)
Platelets: 143 10*3/uL — ABNORMAL LOW (ref 150–400)
RBC: 3.59 MIL/uL — ABNORMAL LOW (ref 3.87–5.11)
RDW: 13.2 % (ref 11.5–15.5)
WBC: 23.3 10*3/uL — ABNORMAL HIGH (ref 4.0–10.5)
nRBC: 0 % (ref 0.0–0.2)

## 2020-04-22 LAB — RENAL FUNCTION PANEL
Albumin: 2.4 g/dL — ABNORMAL LOW (ref 3.5–5.0)
Anion gap: 6 (ref 5–15)
BUN: 18 mg/dL (ref 8–23)
CO2: 25 mmol/L (ref 22–32)
Calcium: 7.6 mg/dL — ABNORMAL LOW (ref 8.9–10.3)
Chloride: 105 mmol/L (ref 98–111)
Creatinine, Ser: 0.6 mg/dL (ref 0.44–1.00)
GFR calc Af Amer: >60 mL/min (ref >60)
GFR calc non Af Amer: >60 mL/min (ref >60)
Glucose, Bld: 172 mg/dL — ABNORMAL HIGH (ref 70–99)
Phosphorus: 3.5 mg/dL (ref 2.5–4.6)
Potassium: 5.2 mmol/L — ABNORMAL HIGH (ref 3.5–5.1)
Sodium: 136 mmol/L (ref 135–145)

## 2020-04-22 LAB — GLUCOSE, CAPILLARY
Glucose-Capillary: 160 mg/dL — ABNORMAL HIGH (ref 70–99)
Glucose-Capillary: 119 mg/dL — ABNORMAL HIGH (ref 70–99)
Glucose-Capillary: 127 mg/dL — ABNORMAL HIGH (ref 70–99)

## 2020-04-22 LAB — MAGNESIUM: Magnesium: 1.8 mg/dL (ref 1.7–2.4)

## 2020-04-22 LAB — PHOSPHORUS: Phosphorus: 3.6 mg/dL (ref 2.5–4.6)

## 2020-04-22 MED ORDER — ENOXAPARIN SODIUM 40 MG/0.4ML ~~LOC~~ SOLN
40.0000 mg | SUBCUTANEOUS | Status: DC
  Administered 2020-04-22 – 2020-04-23 (×2): 40 mg via SUBCUTANEOUS
  Filled 2020-04-22 (×2): qty 0.4

## 2020-04-22 MED ORDER — HYDRALAZINE HCL 20 MG/ML IJ SOLN
10.0000 mg | Freq: Four times a day (QID) | INTRAMUSCULAR | Status: DC | PRN
  Administered 2020-04-22: 10 mg via INTRAVENOUS
  Filled 2020-04-22: qty 1

## 2020-04-22 MED ORDER — DEXTROSE 10 % IV SOLN: INTRAVENOUS | Status: AC

## 2020-04-22 MED ORDER — HYDROMORPHONE HCL 2 MG/ML IJ SOLN
1.0000 mg | INTRAMUSCULAR | Status: DC | PRN
  Administered 2020-04-22 – 2020-04-25 (×23): 1 mg via INTRAVENOUS
  Filled 2020-04-22 (×23): qty 1

## 2020-04-22 MED ORDER — TRAVASOL 10 % IV SOLN
INTRAVENOUS | Status: AC
  Filled 2020-04-22: qty 936

## 2020-04-22 NOTE — Progress Notes (Signed)
Notified by pharmacy that this patient's K is elevated, suggesting holding of the TPN at this time. TPN turned off and replaced with D10 at 75 per order. Will continue to monitor.

## 2020-04-22 NOTE — Progress Notes (Signed)
Assumed care of pt at 1500. Agree with previous RN assessment.   Mylissa Lambe, Bing Neighbors, RN

## 2020-04-22 NOTE — Progress Notes (Signed)
1 Day Post-Op   Subjective/Chief Complaint: off vent awake alert Pt awake and alert    Objective: Vital signs in last 24 hours: Temp:  [97.5 F (36.4 C)-99.4 F (37.4 C)] 98.3 F (36.8 C) (04/24 0842) Pulse Rate:  [75-105] 75 (04/24 0500) Resp:  [14-30] 14 (04/24 0500) BP: (123-187)/(59-95) 151/59 (04/24 0500) SpO2:  [90 %-98 %] 98 % (04/24 0500) Weight:  [53.6 kg] 53.6 kg (04/23 1256) Last BM Date: 04/13/20  Intake/Output from previous day: 04/23 0701 - 04/24 0700 In: 4589.9 [I.V.:3738.8; IV Piggyback:851.1] Out: 2975 [Urine:2275; Emesis/NG output:700] Intake/Output this shift: No intake/output data recorded.  Incision/Wound:vac in place some mild erythema noted inferiorly ? Skin stress   Lab Results:  Recent Labs    04/21/20 0821 04/22/20 0702  WBC 20.8* 23.3*  HGB 11.8* 12.0  HCT 35.3* 38.3  PLT 224 143*   BMET Recent Labs    04/21/20 0448 04/22/20 0702  NA 138 136  K 4.5 5.2*  CL 103 105  CO2 27 25  GLUCOSE 128* 172*  BUN 20 18  CREATININE 0.51 0.60  CALCIUM 7.7* 7.6*   PT/INR No results for input(s): LABPROT, INR in the last 72 hours. ABG No results for input(s): PHART, HCO3 in the last 72 hours.  Invalid input(s): PCO2, PO2  Studies/Results: DG CHEST PORT 1 VIEW  Result Date: 04/20/2020 CLINICAL DATA:  69 year old female with shortness of breath, weakness and nausea. Metastatic colon cancer. EXAM: PORTABLE CHEST 1 VIEW COMPARISON:  Chest radiographs 08/20/2019. CT Abdomen and Pelvis 04/11/2020. FINDINGS: Portable AP semi upright view at 0917 hours. Stable right chest power port, accessed. An enteric tube courses to the abdomen, side hole is at the level of the gastric cardia. Lower lung volumes. Crowding of markings at both lung bases. No pneumothorax, pulmonary edema, or confluent pulmonary opacity. Mediastinal contours remain normal. Visualized tracheal air column is within normal limits. Stable visualized osseous structures. IMPRESSION: 1. Low  lung volumes, otherwise no acute cardiopulmonary abnormality. 2. Enteric tube placed into the stomach, side hole at the level of the gastric cardia. Electronically Signed   By: Genevie Ann M.D.   On: 04/20/2020 12:58    Anti-infectives: Anti-infectives (From admission, onward)   Start     Dose/Rate Route Frequency Ordered Stop   04/20/20 1000  ciprofloxacin (CIPRO) IVPB 400 mg     400 mg 200 mL/hr over 60 Minutes Intravenous Every 12 hours 04/20/20 0856     04/20/20 1000  metroNIDAZOLE (FLAGYL) IVPB 500 mg     500 mg 100 mL/hr over 60 Minutes Intravenous Every 8 hours 04/20/20 0856     04/19/20 0600  ciprofloxacin (CIPRO) IVPB 400 mg     400 mg 200 mL/hr over 60 Minutes Intravenous On call to O.R. 04/18/20 1449 04/19/20 1200      Assessment/Plan:   Chronic tobacco use Abdominal wall thrombus-on Xarelto Hx EtOH abuse AKI Malnutrition - prealbumin 14.7 (4/16), on TNA  Metastatic colon cancerwith right hemicolectomy 2016 - ongoing chemotherapy, followed by Dr. Benay Spice -Excision of malignant mass from the abdominal including skin, muscle, fascia and peritoneum(15 x 12cm), abdominal reconstruction with [name], 10/03/17 Dr. Barry Dienes and [name]   POD 3/1 , s/p ex lap with SBR for contained perforation fromSBO -cont ABTHERA vac today. returned to OR yesterday  for abdominal closure and washout -cont NGT today . -cont abx therapy for perforation of SB- WBC up slightly but more than likely reactive since ex lap yesterday was very clean and bowel  looked healthy - some skin stress noted today - follow for now -   FEN -NPO/NGT/IVFs VTE -Lovenox ID -cipro/Flagyl 4/22 -->  LOS: 11 days    Yolanda Watson A Yolanda Watson 04/22/2020

## 2020-04-22 NOTE — Progress Notes (Signed)
PHARMACY - TOTAL PARENTERAL NUTRITION CONSULT NOTE   Indication: small bowel obstruction  Patient Measurements: Height: 5\' 6"  (167.6 cm) Weight: 53.6 kg (118 lb 2.7 oz) IBW/kg (Calculated) : 59.3   Body mass index is 19.07 kg/m.  Assessment: 60 y/oF with PMH of metastatic colon cancer, abdominal wall thrombus who presented on 04/11/2020 secondary to abdominal pain and found to have SBO. Pharmacy consulted for TPN.   Glucose / Insulin: No hx of DM, CBG elevated after receiving dexamethasone in OR (160-216).  Received 8 units Novolog.  Anticipate CBGs will normalize after dexamethasone cleared.  Continue sensitive SSI q6h.  Electrolytes:  K elevated at 5.2.  Mag, Phos Na, CorrCa WNL.  Chloride, CO2 WNL. Renal: SCr/BUN WNL, stable LFTs / TGs: LFTs WNL (4/14)/Triglycerides 99 (4/20) Prealbumin / albumin: 14.7 (4/16), < 5 (4/20)  Intake / Output; MIVF: 24h I/O net: +895 mL. NG/drain output 1900 mL.  NS at 100 mL/hr GI Imaging:  -4/13 CT abd/pelvis: SBO with transition point in the mid abdomen -4/19 AXR: Mild improvement in the pattern of partial small bowel obstruction with interval evidence of mucosal thickening involving the dilated loops of jejunum without pneumatosis.  Surgeries / Procedures:  -4/21: Ex lap with small bowel resection & wound vac -4/23: Ex lap with abdominal closure & washout & wound vac  Central access: port  TPN start date: 04/17/2020  Nutritional Goals: per RD recommendation on 4/20: Kcal: 1735 - 1900  Protein: 85-95 g Fluid: >/= 1.8 L/day  Custom TPN at goal rate of 75 mL/hr will provide 94 g protein, 270 g dextrose, 1832 kcal; meeting 100% of patient needs.   Current Nutrition:  -TPN -Diet: NPO except ice chips, sips of liquid for comfort  Plan:  Now: Stop TPN.  Hang D10W at 49ml/hr.  At 1800:  Resume TPN at 75 mL/hr  Electrolytes in TPN:   37mEq/L of Na, Decrease 50mEq/L of K, 80mEq/L of Ca, 27mEq/L of Mag, and 53mmol/L of Phos.   Cl:Ac ratio -  max chloride  Standard MVI and trace elements to TPN.   Continue Sensitive q6h SSI and adjust as needed.    Continue IVF at 100 ml/hr or per MD  Monitor TPN labs on Mon/Thurs. Recheck electrolytes with AM labs tomorrow.   Netta Cedars, PharmD 04/22/20 @ 8:33 AM

## 2020-04-22 NOTE — Progress Notes (Signed)
PROGRESS NOTE    Yolanda Watson  [id number] DOB: 21-Nov-1951 DOA: 04/11/2020 PCP: Patient, No Pcp Per   Brief Narrative: 69 year old with past medical history significant for metastatic colon cancer on chemotherapy, chronic tobacco abuse, abdominal wall thrombosis on Xarelto presented to the hospital with complaints of abdominal pain and found to have an SBO.  Patient was admitted to the hospital for further evaluation and treatment.  Patient failed to improve with medical management, she underwent oratory laparotomy, repair of multiple serosal tears, small bowel resection, lysis of adhesion, application of a wound VAC on 4/24.  Patient subsequently had exploratory laparotomy, closure of the abdomen with ACell gentry X thick 20 x 30 cm, application of wound VAC on 4/24.    Assessment & Plan:   Principal Problem:   SBO (small bowel obstruction) (HCC) Active Problems:   Abdominal pain   Nausea & vomiting   Acute hyponatremia   AKI (acute kidney injury) ([place])   Leukocytosis   1-Small bowel obstruction: with perforation.  -Underwent exploratory laparotomy, repair of multiple serosal tears, small bowel resection, lysis of adhesion and application of wound VAC on 4/21st. -Patient subsequently had exploratory laparotomy, closure of the abdomen with ACell gentry X thick 20 x 30 cm, application of wound VAC on 4/24.  -N.p.o., still with NG tube in place.  -Surgery planning to take patient back to the OR today  to try to close her abdominal wall wound. -on  TPN. Hold TPN for now due to elevated potasium. Plan to decrease potassium content on next TPN bag.  -On cipro and flagyl. WBC up today, per surgery continue with antibiotics.   2- Coffee Ground fluid from NG tube;  Start IV protonix.  Hb stable.  Hold Lovenox.   3-Acute hypoxic respiratory failure: Postop, and use of opioids. Chest X ray: Showed low lung volumes. Incentive spirometry. Wean off of oxygen as tolerated. Of NB mask  Oxygen 88--90 on RA.  Stable.   4-AKI: During this admission up to 1.6. Improved with IV fluids.  Metabolic alkalosis: Secondary to GI losses. On IV Fluids  Hypokalemia: Resolved.  Leukocytosis: Related to #1  Metastatatic colon cancer: Follow with Dr Benay Spice.   Pancreatic duct dilation Incidental finding on CT abdomen/pelvis. No mass lesion noted. Patient is currently asymptomatic. Lipase minimally elevated.  Need to monitor as outpatient.  Abdominal wall thrombosis: Was on Xarelto as an outpatient. resume when okay by surgery Hyponatremia: Resolved  with IV fluids Hypomagnesemia; repeat Mg dose. IV magnesium. Nutrition Problem: Inadequate oral intake Etiology: inability to eat    Signs/Symptoms: NPO status    Interventions: TPN  Estimated body mass index is 19.07 kg/m as calculated from the following:   Height as of this encounter: 5\' 6"  (1.676 m).   Weight as of this encounter: 53.6 kg.   DVT prophylaxis: Lovenox on hold, due to coffee ground fluid from NGT Code Status: DNR Family Communication: Husband at bedside Disposition Plan:  Patient is from: Home  Anticipated d/c date: 4--5 days depend on recovery from surgery  Barriers to d/c or necessity for inpatient status: needs repeat Sx for wound closure tomorrow. Not tolerating diet.   Consultants:   Surgery  Procedures:  t exploratory laparotomy, repair of multiple serosal tears, small bowel resection, lysis of adhesion and application of wound VAC on 4/21st.    Antimicrobials:    Subjective: She is complaining of abdominal pain, severe today.  Denies dyspnea.   Objective: Vitals:   04/21/20 2342 04/22/20 0000  04/22/20 0329 04/22/20 0500  BP:  (!) 187/81  (!) 151/59  Pulse:  87  75  Resp:  (!) 21  14  Temp: (!) 97.5 F (36.4 C)  98.4 F (36.9 C)   TempSrc: Oral  Oral   SpO2:  96%  98%  Weight:      Height:        Intake/Output Summary (Last 24 hours) at 04/22/2020 0703 Last data  filed at 04/22/2020 0700 Gross per 24 hour  Intake 4589.88 ml  Output 2975 ml  Net 1614.88 ml   Filed Weights   04/19/20 0500 04/20/20 0500 04/21/20 1256  Weight: 52.7 kg 53.6 kg 53.6 kg    Examination:  General exam: NAD, ill appearing Respiratory system: CTA Cardiovascular system: S 1, S 2 RRR Gastrointestinal system: BS decrease, wound vac in place.  Central nervous system: Alert Extremities: no edema     Data Reviewed: I have personally reviewed following labs and imaging studies  CBC: Recent Labs  Lab 04/16/20 0138 04/17/20 0140 04/18/20 0456 04/20/20 0507 04/21/20 0821  WBC 14.9* 14.9* 11.1* 16.8* 20.8*  NEUTROABS  --   --  9.0*  --   --   HGB 12.2 12.4 13.2 11.5* 11.8*  HCT 37.9 38.1 41.7 35.6* 35.3*  MCV 105.9* 107.3* 106.4* 106.9* 104.7*  PLT 242 255 260 148* [id number]   Basic Metabolic Panel: Recent Labs  Lab 04/17/20 0140 04/18/20 0456 04/19/20 0540 04/20/20 0507 04/21/20 0448  NA 139 140  139 138  137 136  136 138  K 3.4* 3.8  3.8 3.4*  3.4* 4.5  4.5 4.5  CL 89* 92*  91* 97*  96* 101  101 103  CO2 33* 38*  36* 29  30 27  27 27   GLUCOSE 93 175*  173* 163*  164* 257*  257* 128*  BUN 15 19  18 22  23 21  21 20   CREATININE 0.83 0.70  0.76 0.65  0.62 0.58  0.65 0.51  CALCIUM 8.1* 8.3*  8.3* 8.1*  8.1* 7.0*  6.9* 7.7*  MG 1.6* 2.4 1.8 1.7 1.6*  PHOS 2.5 2.6  2.5 2.0*  2.0* 3.7  3.7 2.5   GFR: Estimated Creatinine Clearance: 57.7 mL/min (by C-G formula based on SCr of 0.51 mg/dL). Liver Function Tests: Recent Labs  Lab 04/17/20 0140 04/18/20 0456 04/19/20 0540 04/20/20 0507 04/21/20 0448  AST  --  12*  --  10*  --   ALT  --  9  --  9  --   ALKPHOS  --  58  --  56  --   BILITOT  --  0.6  --  0.3  --   PROT  --  5.8*  --  4.8*  --   ALBUMIN 3.0* 3.0*  2.9* 2.5* 2.2*  2.2* 2.2*   No results for input(s): LIPASE, AMYLASE in the last 168 hours. No results for input(s): AMMONIA in the last 168 hours. Coagulation  Profile: No results for input(s): INR, PROTIME in the last 168 hours. Cardiac Enzymes: No results for input(s): CKTOTAL, CKMB, CKMBINDEX, TROPONINI in the last 168 hours. BNP (last 3 results) No results for input(s): PROBNP in the last 8760 hours. HbA1C: No results for input(s): HGBA1C in the last 72 hours. CBG: Recent Labs  Lab 04/21/20 0548 04/21/20 1218 04/21/20 1641 04/21/20 2340 04/22/20 0627  GLUCAP 115* 117* 203* 216* 160*   Lipid Profile: No results for input(s): CHOL, HDL, LDLCALC, TRIG, CHOLHDL, LDLDIRECT in  the last 72 hours. Thyroid Function Tests: No results for input(s): TSH, T4TOTAL, FREET4, T3FREE, THYROIDAB in the last 72 hours. Anemia Panel: No results for input(s): VITAMINB12, FOLATE, FERRITIN, TIBC, IRON, RETICCTPCT in the last 72 hours. Sepsis Labs: No results for input(s): PROCALCITON, LATICACIDVEN in the last 168 hours.  Recent Results (from the past 240 hour(s))  Surgical pcr screen     Status: None   Collection Time: 04/19/20  2:13 AM   Specimen: Nasal Mucosa; Nasal Swab  Result Value Ref Range Status   MRSA, PCR NEGATIVE NEGATIVE Final   Staphylococcus aureus NEGATIVE NEGATIVE Final    Comment: (NOTE) The Xpert SA Assay (FDA approved for NASAL specimens in patients 21 years of age and older), is one component of a comprehensive surveillance program. It is not intended to diagnose infection nor to guide or monitor treatment. Performed at [facility], [place] [address], [place], [place] [address]          Radiology Studies: DG CHEST PORT 1 VIEW  Result Date: 04/20/2020 CLINICAL DATA:  69 year old female with shortness of breath, weakness and nausea. Metastatic colon cancer. EXAM: PORTABLE CHEST 1 VIEW COMPARISON:  Chest radiographs 08/20/2019. CT Abdomen and Pelvis 04/11/2020. FINDINGS: Portable AP semi upright view at 0917 hours. Stable right chest power port, accessed. An enteric tube courses to the abdomen, side hole is at  the level of the gastric cardia. Lower lung volumes. Crowding of markings at both lung bases. No pneumothorax, pulmonary edema, or confluent pulmonary opacity. Mediastinal contours remain normal. Visualized tracheal air column is within normal limits. Stable visualized osseous structures. IMPRESSION: 1. Low lung volumes, otherwise no acute cardiopulmonary abnormality. 2. Enteric tube placed into the stomach, side hole at the level of the gastric cardia. Electronically Signed   By: Genevie Ann M.D.   On: 04/20/2020 12:58        Scheduled Meds: . Chlorhexidine Gluconate Cloth  6 each Topical Daily  . insulin aspart  0-9 Units Subcutaneous Q6H  . mouth rinse  15 mL Mouth Rinse BID  . pantoprazole (PROTONIX) IV  40 mg Intravenous Q12H  . sodium chloride flush  10-40 mL Intracatheter Q12H  . sodium chloride flush  3 mL Intravenous Q12H   Continuous Infusions: . sodium chloride 100 mL/hr at 04/22/20 0000  . ciprofloxacin Stopped (04/21/20 2305)  . metronidazole Stopped (04/22/20 0321)  . TPN ADULT (ION) 75 mL/hr at 04/22/20 0000     LOS: 11 days    Time spent: 35 minutes.     Elmarie Shiley, MD [facility]   If 7PM-7AM, please contact night-coverage [email or web address]  04/22/2020, 7:03 AM

## 2020-04-22 NOTE — Progress Notes (Signed)
Ng tube found out 12 cm from previous measurement this morning. Patient not in distress, NG clamped pending adjustment. Advanced to previous marking and KUB ordered to confirm.

## 2020-04-23 DIAGNOSIS — K56609 Unspecified intestinal obstruction, unspecified as to partial versus complete obstruction: Secondary | ICD-10-CM | POA: Diagnosis not present

## 2020-04-23 LAB — CBC
HCT: 37 % (ref 36.0–46.0)
Hemoglobin: 11.7 g/dL — ABNORMAL LOW (ref 12.0–15.0)
MCH: 33.5 pg (ref 26.0–34.0)
MCHC: 31.6 g/dL (ref 30.0–36.0)
MCV: 106 fL — ABNORMAL HIGH (ref 80.0–100.0)
Platelets: 156 10*3/uL (ref 150–400)
RBC: 3.49 MIL/uL — ABNORMAL LOW (ref 3.87–5.11)
RDW: 13.4 % (ref 11.5–15.5)
WBC: 17.5 10*3/uL — ABNORMAL HIGH (ref 4.0–10.5)
nRBC: 0 % (ref 0.0–0.2)

## 2020-04-23 LAB — RENAL FUNCTION PANEL
Albumin: 2.3 g/dL — ABNORMAL LOW (ref 3.5–5.0)
Anion gap: 6 (ref 5–15)
BUN: 17 mg/dL (ref 8–23)
CO2: 26 mmol/L (ref 22–32)
Calcium: 7.8 mg/dL — ABNORMAL LOW (ref 8.9–10.3)
Chloride: 104 mmol/L (ref 98–111)
Creatinine, Ser: 0.48 mg/dL (ref 0.44–1.00)
GFR calc Af Amer: >60 mL/min (ref >60)
GFR calc non Af Amer: >60 mL/min (ref >60)
Glucose, Bld: 126 mg/dL — ABNORMAL HIGH (ref 70–99)
Phosphorus: 3.3 mg/dL (ref 2.5–4.6)
Potassium: 4.8 mmol/L (ref 3.5–5.1)
Sodium: 136 mmol/L (ref 135–145)

## 2020-04-23 LAB — GLUCOSE, CAPILLARY
Glucose-Capillary: 101 mg/dL — ABNORMAL HIGH (ref 70–99)
Glucose-Capillary: 130 mg/dL — ABNORMAL HIGH (ref 70–99)
Glucose-Capillary: 122 mg/dL — ABNORMAL HIGH (ref 70–99)

## 2020-04-23 LAB — MAGNESIUM: Magnesium: 1.8 mg/dL (ref 1.7–2.4)

## 2020-04-23 MED ORDER — BISACODYL 10 MG RE SUPP
10.0000 mg | Freq: Every day | RECTAL | Status: DC | PRN
  Administered 2020-04-23 – 2020-05-03 (×3): 10 mg via RECTAL
  Filled 2020-04-23 (×3): qty 1

## 2020-04-23 MED ORDER — TRAVASOL 10 % IV SOLN
INTRAVENOUS | Status: AC
  Filled 2020-04-23: qty 936

## 2020-04-23 NOTE — Progress Notes (Signed)
PHARMACY - TOTAL PARENTERAL NUTRITION CONSULT NOTE   Indication: small bowel obstruction  Patient Measurements: Height: 5\' 6"  (167.6 cm) Weight: 53.3 kg (117 lb 8.1 oz) IBW/kg (Calculated) : 59.3   Body mass index is 18.97 kg/m.  Assessment: 30 y/oF with PMH of metastatic colon cancer, abdominal wall thrombus who presented on 04/11/2020 secondary to abdominal pain and found to have SBO. Pharmacy consulted for TPN.   Glucose / Insulin: No hx of DM, CBG at goal <150.  Continue sensitive SSI q6h. 1 unit administered in past 24h. Electrolytes:  WNL.  Renal: SCr/BUN WNL, stable LFTs / TGs: LFTs WNL (4/14)/Triglycerides 99 (4/20) Prealbumin / albumin: 14.7 (4/16), < 5 (4/20)  Intake / Output; MIVF: 24h I/O net: -225 mL. NG/drain output 300 mL.  NS at 100 mL/hr GI Imaging:  -4/13 CT abd/pelvis: SBO with transition point in the mid abdomen -4/19 AXR: Mild improvement in the pattern of partial small bowel obstruction with interval evidence of mucosal thickening involving the dilated loops of jejunum without pneumatosis.  Surgeries / Procedures:  -4/21: Ex lap with small bowel resection & wound vac -4/23: Ex lap with abdominal closure & washout & wound vac  Central access: port  TPN start date: 04/17/2020  Nutritional Goals: per RD recommendation on 4/20: Kcal: 1735 - 1900  Protein: 85-95 g Fluid: >/= 1.8 L/day  Custom TPN at goal rate of 75 mL/hr will provide 94 g protein, 270 g dextrose, 1832 kcal; meeting 100% of patient needs.   Current Nutrition:  -TPN -Diet: NPO except ice chips, sips of liquid for comfort  Plan:  At 1800:  Continue TPN at 75 mL/hr  Electrolytes in TPN:   68mEq/L of Na, 31mEq/L of K, 68mEq/L of Ca, 63mEq/L of Mag, and 55mmol/L of Phos.   Cl:Ac ratio - max chloride  Standard MVI and trace elements to TPN.   Continue Sensitive q6h SSI and adjust as needed.    Continue IVF at 100 ml/hr per MD  Monitor TPN labs on Mon/Thurs.   Netta Cedars,  PharmD 04/23/20 @ 8:51 AM

## 2020-04-23 NOTE — Progress Notes (Signed)
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  Patient was admitted to the hospital for further evaluation and treatment.  Patient failed to improve with medical management, she underwent oratory laparotomy, repair of multiple serosal tears, small bowel resection, lysis of adhesion, application of a wound VAC on 4/24.  Patient subsequently had exploratory laparotomy, closure of the abdomen with ACell gentry X thick 20 x 30 cm, application of wound VAC on 4/24.    Assessment & Plan:   Principal Problem:   SBO (small bowel obstruction) (HCC) Active Problems:   Abdominal pain   Nausea & vomiting   Acute hyponatremia   AKI (acute kidney injury) ([place])   Leukocytosis   1-Small bowel obstruction: with perforation: -Underwent exploratory laparotomy, repair of multiple serosal tears, small bowel resection, lysis of adhesion and application of wound VAC on 4/21st. -Patient subsequently had exploratory laparotomy, closure of the abdomen with ACell gentry X thick 20 x 30 cm, application of wound VAC on 4/24.  -N.p.o., still with NG tube in place.  -underwent closure  her abdominal wall wound 4/24 -on  TPN.  -On cipro and flagyl.  -WBC down today.   2- Coffee Ground fluid from NG tube;  Start IV protonix.  Hb stable.  Holding  Lovenox. Will consider resuming hb tomorrow.   3-Acute hypoxic respiratory failure: Postop, and use of opioids. Chest X ray: Showed low lung volumes. Incentive spirometry. Wean off of oxygen as tolerated. Of NB mask Oxygen 88--90 on RA.  Stable.   4-AKI: During this admission up to 1.6. Improved with IV fluids.  Metabolic alkalosis: Secondary to GI  losses. On IV Fluids  Hypokalemia: Resolved.  Leukocytosis: Related to #1  Metastatatic colon cancer: Follow with Dr Benay Spice.   Pancreatic duct dilation Incidental finding on CT abdomen/pelvis. No mass lesion noted. Patient is currently asymptomatic. Lipase minimally elevated.  Need to monitor as outpatient.  Abdominal wall thrombosis: Was on Xarelto as an outpatient. resume when okay by surgery Hyponatremia: Resolved  with IV fluids Hypomagnesemia; replaced.   Nutrition Problem: Inadequate oral intake Etiology: inability to eat    Signs/Symptoms: NPO status    Interventions: TPN  Estimated body mass index is 18.97 kg/m as calculated from the following:   Height as of this encounter: 5\' 6"  (1.676 m).   Weight as of this encounter: 53.3 kg.   DVT prophylaxis: Lovenox on hold, due to coffee ground fluid from NGT Code Status: DNR Family Communication: Husband at bedside Disposition Plan:  Patient is from: Home  Anticipated d/c date: 4--5 days depend on recovery from surgery  Barriers to d/c or necessity for inpatient status: needs repeat Sx for wound closure tomorrow. Not tolerating diet.   Consultants:   Surgery  Procedures:  t exploratory laparotomy, repair of multiple serosal tears, small bowel resection, lysis of adhesion and application of wound VAC on 4/21st.    Antimicrobials:    Subjective: She still having significant abdominal pain although Dilaudid helps more with pain control. She denies dyspnea. She has not had a bowel movement.   Objective: Vitals:   04/23/20 0500 04/23/20 0744 04/23/20 0818 04/23/20 1217  BP:  134/61    Pulse: 76 89  Resp: 11 (!) 24    Temp: 98 F (36.7 C)  (!) 97.5 F (36.4 C) 98.1 F (36.7 C)  TempSrc: Oral  Oral Oral  SpO2: 98% 98%    Weight: 53.3 kg     Height:        Intake/Output Summary (Last 24 hours) at 04/23/2020 1412 Last data filed at 04/23/2020 1100 Gross per 24 hour  Intake 2209.7 ml  Output  3750 ml  Net -1540.3 ml   Filed Weights   04/20/20 0500 04/21/20 1256 04/23/20 0500  Weight: 53.6 kg 53.6 kg 53.3 kg    Examination:  General exam: No acute distress, chronic ill-appearing Respiratory system: CTA Cardiovascular system:, S2, regular rhythm or rate Gastrointestinal system:'s present, soft, wound VAC in place Central nervous system; Alert Extremities: no edema     Data Reviewed: I have personally reviewed following labs and imaging studies  CBC: Recent Labs  Lab 04/18/20 0456 04/20/20 0507 04/21/20 0821 04/22/20 0702 04/23/20 0738  WBC 11.1* 16.8* 20.8* 23.3* 17.5*  NEUTROABS 9.0*  --   --   --   --   HGB 13.2 11.5* 11.8* 12.0 11.7*  HCT 41.7 35.6* 35.3* 38.3 37.0  MCV 106.4* 106.9* 104.7* 106.7* 106.0*  PLT 260 148* 224 143* [id number]   Basic Metabolic Panel: Recent Labs  Lab 04/19/20 0540 04/20/20 0507 04/21/20 0448 04/22/20 0702 04/23/20 0317 04/23/20 0644  NA 138  137 136  136 138 136  --  136  K 3.4*  3.4* 4.5  4.5 4.5 5.2*  --  4.8  CL 97*  96* 101  101 103 105  --  104  CO2 29  30 27  27 27 25   --  26  GLUCOSE 163*  164* 257*  257* 128* 172*  --  126*  BUN 22  23 21  21 20 18   --  17  CREATININE 0.65  0.62 0.58  0.65 0.51 0.60  --  0.48  CALCIUM 8.1*  8.1* 7.0*  6.9* 7.7* 7.6*  --  7.8*  MG 1.8 1.7 1.6* 1.8 1.8  --   PHOS 2.0*  2.0* 3.7  3.7 2.5 3.6  3.5  --  3.3   GFR: Estimated Creatinine Clearance: 57.4 mL/min (by C-G formula based on SCr of 0.48 mg/dL). Liver Function Tests: Recent Labs  Lab 04/18/20 0456 04/18/20 0456 04/19/20 0540 04/20/20 0507 04/21/20 0448 04/22/20 0702 04/23/20 0644  AST 12*  --   --  10*  --   --   --   ALT 9  --   --  9  --   --   --   ALKPHOS 58  --   --  56  --   --   --   BILITOT 0.6  --   --  0.3  --   --   --   PROT 5.8*  --   --  4.8*  --   --   --   ALBUMIN 3.0*  2.9*   < > 2.5* 2.2*  2.2* 2.2* 2.4* 2.3*   < > = values in this interval not displayed.   No results for  input(s): LIPASE, AMYLASE in the last 168 hours. No results for input(s): AMMONIA in the last 168 hours. Coagulation Profile: No results for input(s): INR, PROTIME in the last 168 hours. Cardiac Enzymes: No results for input(s): CKTOTAL, CKMB, CKMBINDEX, TROPONINI in the last 168 hours. BNP (last 3 results) No results for input(s): PROBNP in  the last 8760 hours. HbA1C: No results for input(s): HGBA1C in the last 72 hours. CBG: Recent Labs  Lab 04/22/20 0627 04/22/20 1220 04/22/20 2324 04/23/20 0618 04/23/20 1145  GLUCAP 160* 119* 127* 101* 130*   Lipid Profile: No results for input(s): CHOL, HDL, LDLCALC, TRIG, CHOLHDL, LDLDIRECT in the last 72 hours. Thyroid Function Tests: No results for input(s): TSH, T4TOTAL, FREET4, T3FREE, THYROIDAB in the last 72 hours. Anemia Panel: No results for input(s): VITAMINB12, FOLATE, FERRITIN, TIBC, IRON, RETICCTPCT in the last 72 hours. Sepsis Labs: No results for input(s): PROCALCITON, LATICACIDVEN in the last 168 hours.  Recent Results (from the past 240 hour(s))  Surgical pcr screen     Status: None   Collection Time: 04/19/20  2:13 AM   Specimen: Nasal Mucosa; Nasal Swab  Result Value Ref Range Status   MRSA, PCR NEGATIVE NEGATIVE Final   Staphylococcus aureus NEGATIVE NEGATIVE Final    Comment: (NOTE) The Xpert SA Assay (FDA approved for NASAL specimens in patients 81 years of age and older), is one component of a comprehensive surveillance program. It is not intended to diagnose infection nor to guide or monitor treatment. Performed at [facility], [place] [address] [place], [place] [address]          Radiology Studies: DG Abd 1 View  Result Date: 04/22/2020 CLINICAL DATA:  Confirm NG tube EXAM: ABDOMEN - 1 VIEW COMPARISON:  April 18, 2020 FINDINGS: The NG tube side port is in the body of the stomach with the distal tip in the fundus. Dilated loops of small bowel are less prominent the interval.  IMPRESSION: 1. The NG tube is in good position. Persistent but decreased prominence of the dilated small bowel loops. Electronically Signed   By: Dorise Bullion III M.D   On: 04/22/2020 13:29        Scheduled Meds: . Chlorhexidine Gluconate Cloth  6 each Topical Daily  . enoxaparin (LOVENOX) injection  40 mg Subcutaneous Q24H  . insulin aspart  0-9 Units Subcutaneous Q6H  . mouth rinse  15 mL Mouth Rinse BID  . pantoprazole (PROTONIX) IV  40 mg Intravenous Q12H  . sodium chloride flush  10-40 mL Intracatheter Q12H  . sodium chloride flush  3 mL Intravenous Q12H   Continuous Infusions: . sodium chloride 100 mL/hr at 04/23/20 0903  . ciprofloxacin 400 mg (04/23/20 1217)  . metronidazole 500 mg (04/23/20 1050)  . TPN ADULT (ION) 75 mL/hr at 04/23/20 0600  . TPN ADULT (ION)       LOS: 12 days    Time spent: 35 minutes.     Elmarie Shiley, MD [facility]   If 7PM-7AM, please contact night-coverage [email or web address]  04/23/2020, 2:12 PM

## 2020-04-23 NOTE — Progress Notes (Signed)
2 Days Post-Op   Subjective/Chief Complaint: No BM yet    Objective: Vital signs in last 24 hours: Temp:  [96.7 F (35.9 C)-98.3 F (36.8 C)] 98 F (36.7 C) (04/25 0500) Pulse Rate:  [76-102] 89 (04/25 0744) Resp:  [11-28] 24 (04/25 0744) BP: (134-178)/(61-102) 134/61 (04/25 0744) SpO2:  [90 %-100 %] 98 % (04/25 0744) Weight:  [53.3 kg] 53.3 kg (04/25 0500) Last BM Date: 04/13/20  Intake/Output from previous day: 04/24 0701 - 04/25 0700 In: 4768.7 [I.V.:4041.1; IV Piggyback:727.6] Out: 4750 [Urine:4200; Emesis/NG output:550] Intake/Output this shift: No intake/output data recorded.  Incision/Wound:vac in place less erythema of skin over lower abdomen flat soft   Lab Results:  Recent Labs    04/22/20 0702 04/23/20 0738  WBC 23.3* 17.5*  HGB 12.0 11.7*  HCT 38.3 37.0  PLT 143* 156   BMET Recent Labs    04/21/20 0448 04/22/20 0702  NA 138 136  K 4.5 5.2*  CL 103 105  CO2 27 25  GLUCOSE 128* 172*  BUN 20 18  CREATININE 0.51 0.60  CALCIUM 7.7* 7.6*   PT/INR No results for input(s): LABPROT, INR in the last 72 hours. ABG No results for input(s): PHART, HCO3 in the last 72 hours.  Invalid input(s): PCO2, PO2  Studies/Results: DG Abd 1 View  Result Date: 04/22/2020 CLINICAL DATA:  Confirm NG tube EXAM: ABDOMEN - 1 VIEW COMPARISON:  April 18, 2020 FINDINGS: The NG tube side port is in the body of the stomach with the distal tip in the fundus. Dilated loops of small bowel are less prominent the interval. IMPRESSION: 1. The NG tube is in good position. Persistent but decreased prominence of the dilated small bowel loops. Electronically Signed   By: Dorise Bullion III M.D   On: 04/22/2020 13:29    Anti-infectives: Anti-infectives (From admission, onward)   Start     Dose/Rate Route Frequency Ordered Stop   04/20/20 1000  ciprofloxacin (CIPRO) IVPB 400 mg     400 mg 200 mL/hr over 60 Minutes Intravenous Every 12 hours 04/20/20 0856     04/20/20 1000   metroNIDAZOLE (FLAGYL) IVPB 500 mg     500 mg 100 mL/hr over 60 Minutes Intravenous Every 8 hours 04/20/20 0856     04/19/20 0600  ciprofloxacin (CIPRO) IVPB 400 mg     400 mg 200 mL/hr over 60 Minutes Intravenous On call to O.R. 04/18/20 1449 04/19/20 1200      Assessment/Plan: s/p Procedure(s): EXPLORATORY LAPAROTOMY (N/A)   Chronic tobacco use Abdominal wall thrombus-on Xarelto Hx EtOH abuse AKI Malnutrition - prealbumin 14.7 (4/16), on TNA  Metastatic colon cancerwith right hemicolectomy 2016 - ongoing chemotherapy, followed by Dr. Benay Spice -Excision of malignant mass from the abdominal including skin, muscle, fascia and peritoneum(15 x 12cm), abdominal reconstruction with [name], 10/03/17 Dr. Barry Dienes and [name]   POD3/1 , s/p ex lap with SBR for contained perforation fromSBO -cont ABTHERA vac today.returned to OR yesterday  for abdominal closure and washout -cont NGT today . -cont abx therapy for perforation of SB- WBC down today  - cont ABX   FEN -NPO/NGT/IVFs VTE -Lovenox ID -cipro/Flagyl 4/22 -->  LOS: 12 days    Turner Daniels MD  04/23/2020

## 2020-04-24 ENCOUNTER — Encounter: Payer: Self-pay | Admitting: *Deleted

## 2020-04-24 DIAGNOSIS — K56609 Unspecified intestinal obstruction, unspecified as to partial versus complete obstruction: Secondary | ICD-10-CM | POA: Diagnosis not present

## 2020-04-24 LAB — CBC
HCT: 33.8 % — ABNORMAL LOW (ref 36.0–46.0)
Hemoglobin: 10.9 g/dL — ABNORMAL LOW (ref 12.0–15.0)
MCH: 33.3 pg (ref 26.0–34.0)
MCHC: 32.2 g/dL (ref 30.0–36.0)
MCV: 103.4 fL — ABNORMAL HIGH (ref 80.0–100.0)
Platelets: 139 10*3/uL — ABNORMAL LOW (ref 150–400)
RBC: 3.27 MIL/uL — ABNORMAL LOW (ref 3.87–5.11)
RDW: 13.2 % (ref 11.5–15.5)
WBC: 13.6 10*3/uL — ABNORMAL HIGH (ref 4.0–10.5)
nRBC: 0 % (ref 0.0–0.2)

## 2020-04-24 LAB — COMPREHENSIVE METABOLIC PANEL
ALT: 11 U/L (ref 0–44)
AST: 14 U/L — ABNORMAL LOW (ref 15–41)
Albumin: 2 g/dL — ABNORMAL LOW (ref 3.5–5.0)
Alkaline Phosphatase: 102 U/L (ref 38–126)
Anion gap: 6 (ref 5–15)
BUN: 16 mg/dL (ref 8–23)
CO2: 23 mmol/L (ref 22–32)
Calcium: 7.6 mg/dL — ABNORMAL LOW (ref 8.9–10.3)
Chloride: 104 mmol/L (ref 98–111)
Creatinine, Ser: 0.51 mg/dL (ref 0.44–1.00)
GFR calc Af Amer: >60 mL/min (ref >60)
GFR calc non Af Amer: >60 mL/min (ref >60)
Glucose, Bld: 143 mg/dL — ABNORMAL HIGH (ref 70–99)
Potassium: 4.2 mmol/L (ref 3.5–5.1)
Sodium: 133 mmol/L — ABNORMAL LOW (ref 135–145)
Total Bilirubin: 0.4 mg/dL (ref 0.3–1.2)
Total Protein: 5.4 g/dL — ABNORMAL LOW (ref 6.5–8.1)

## 2020-04-24 LAB — PHOSPHORUS: Phosphorus: 3.8 mg/dL (ref 2.5–4.6)

## 2020-04-24 LAB — DIFFERENTIAL
Abs Immature Granulocytes: 0.23 10*3/uL — ABNORMAL HIGH (ref 0.00–0.07)
Basophils Absolute: 0.1 10*3/uL (ref 0.0–0.1)
Basophils Relative: 0 %
Eosinophils Absolute: 0.4 10*3/uL (ref 0.0–0.5)
Eosinophils Relative: 3 %
Immature Granulocytes: 2 %
Lymphocytes Relative: 9 %
Lymphs Abs: 1.2 10*3/uL (ref 0.7–4.0)
Monocytes Absolute: 1.1 10*3/uL — ABNORMAL HIGH (ref 0.1–1.0)
Monocytes Relative: 8 %
Neutro Abs: 10.7 10*3/uL — ABNORMAL HIGH (ref 1.7–7.7)
Neutrophils Relative %: 78 %

## 2020-04-24 LAB — RENAL FUNCTION PANEL
Albumin: 2 g/dL — ABNORMAL LOW (ref 3.5–5.0)
Anion gap: 7 (ref 5–15)
BUN: 16 mg/dL (ref 8–23)
CO2: 23 mmol/L (ref 22–32)
Calcium: 7.7 mg/dL — ABNORMAL LOW (ref 8.9–10.3)
Chloride: 104 mmol/L (ref 98–111)
Creatinine, Ser: 0.53 mg/dL (ref 0.44–1.00)
GFR calc Af Amer: >60 mL/min (ref >60)
GFR calc non Af Amer: >60 mL/min (ref >60)
Glucose, Bld: 141 mg/dL — ABNORMAL HIGH (ref 70–99)
Phosphorus: 3.8 mg/dL (ref 2.5–4.6)
Potassium: 4.3 mmol/L (ref 3.5–5.1)
Sodium: 134 mmol/L — ABNORMAL LOW (ref 135–145)

## 2020-04-24 LAB — GLUCOSE, CAPILLARY
Glucose-Capillary: 101 mg/dL — ABNORMAL HIGH (ref 70–99)
Glucose-Capillary: 112 mg/dL — ABNORMAL HIGH (ref 70–99)
Glucose-Capillary: 119 mg/dL — ABNORMAL HIGH (ref 70–99)
Glucose-Capillary: 140 mg/dL — ABNORMAL HIGH (ref 70–99)
Glucose-Capillary: 165 mg/dL — ABNORMAL HIGH (ref 70–99)

## 2020-04-24 LAB — HEPARIN LEVEL (UNFRACTIONATED): Heparin Unfractionated: <0.1 IU/mL — ABNORMAL LOW (ref 0.30–0.70)

## 2020-04-24 LAB — SURGICAL PATHOLOGY

## 2020-04-24 LAB — PREALBUMIN: Prealbumin: 7.9 mg/dL — ABNORMAL LOW (ref 18–38)

## 2020-04-24 LAB — MAGNESIUM: Magnesium: 1.5 mg/dL — ABNORMAL LOW (ref 1.7–2.4)

## 2020-04-24 LAB — TRIGLYCERIDES: Triglycerides: 78 mg/dL (ref <150)

## 2020-04-24 MED ORDER — HEPARIN (PORCINE) 25000 UT/250ML-% IV SOLN
1100.0000 [IU]/h | INTRAVENOUS | Status: DC
  Administered 2020-04-24: 900 [IU]/h via INTRAVENOUS
  Filled 2020-04-24: qty 250

## 2020-04-24 MED ORDER — MAGNESIUM SULFATE 2 GM/50ML IV SOLN
2.0000 g | Freq: Once | INTRAVENOUS | Status: AC
  Administered 2020-04-24: 2 g via INTRAVENOUS
  Filled 2020-04-24: qty 50

## 2020-04-24 MED ORDER — TRAVASOL 10 % IV SOLN
INTRAVENOUS | Status: AC
  Filled 2020-04-24: qty 936

## 2020-04-24 NOTE — Progress Notes (Signed)
PT Cancellation Note  Patient Details Name: Yolanda Watson MRN: [id number] DOB: 01-05-1951   Cancelled Treatment:    Reason Eval/Treat Not Completed: Patient at procedure or test/unavailable(Pt having wound vac dressing changed. Will follow up at later date/time as schedule allows.)   Verner Mould, DPT Physical Therapist with [facility] [phone]  04/24/2020 4:00 PM

## 2020-04-24 NOTE — Progress Notes (Signed)
Wound VAC changed at bedside today.  Adaptic placed over inferior incision and sponge placed in superior aspect of incision which is clean with granulation tissue.  Acell present and appears healthy.    Yolanda Watson

## 2020-04-24 NOTE — Progress Notes (Signed)
ANTICOAGULATION CONSULT NOTE - Initial Consult  Pharmacy Consult for Heparin IV Indication: IVC thrombus  Allergies  Allergen Reactions  . Penicillins Other (See Comments)    Tolerated cefepime 01/01/2018 "PASSED OUT AS A CHILD" > ? SYNCOPE ? PATIENT HAS HAD A PCN REACTION WITH IMMEDIATE RASH, FACIAL/TONGUE/THROAT SWELLING, SOB, OR LIGHTHEADEDNESS WITH HYPOTENSION:  #  #  #  YES  #  #  #   Has patient had a PCN reaction causing severe rash involving mucus membranes or skin necrosis: No Has patient had a PCN reaction that required hospitalization: No Has patient had a PCN reaction occurring within the last 10 years: No   . Latex Itching and Rash    BED "CHUX PADS"    Patient Measurements: Height: 5\' 6"  (167.6 cm) Weight: 55.2 kg (121 lb 11.1 oz) IBW/kg (Calculated) : 59.3 Heparin Dosing Weight: TBW  Vital Signs: Temp: 96.9 F (36.1 C) (04/26 1200) Temp Source: Axillary (04/26 1200) BP: 144/90 (04/26 1146) Pulse Rate: 100 (04/26 1200)  Labs: Recent Labs    04/22/20 0702 04/22/20 0702 04/23/20 0644 04/23/20 0738 04/24/20 0517  HGB 12.0   < >  --  11.7* 10.9*  HCT 38.3  --   --  37.0 33.8*  PLT 143*  --   --  156 139*  CREATININE 0.60  --  0.48  --  0.53  0.51   < > = values in this interval not displayed.    Estimated Creatinine Clearance: 59.5 mL/min (by C-G formula based on SCr of 0.53 mg/dL).   Medical History: Past Medical History:  Diagnosis Date  . Acquired bilateral hand deformity    both hands with severe digit contracture. VERY limited ROM   . Alcohol abuse   . Colon cancer ([place]) 2016   metastatic to abdominal wall  . Port-A-Cath in place since 04-2017   right chest     Medications:  Scheduled:  . Chlorhexidine Gluconate Cloth  6 each Topical Daily  . insulin aspart  0-9 Units Subcutaneous Q6H  . mouth rinse  15 mL Mouth Rinse BID  . pantoprazole (PROTONIX) IV  40 mg Intravenous Q12H  . sodium chloride flush  10-40 mL Intracatheter Q12H  .  sodium chloride flush  3 mL Intravenous Q12H   Infusions:  . ciprofloxacin Stopped (04/24/20 1021)  . metronidazole Stopped (04/24/20 1124)  . TPN ADULT (ION) 75 mL/hr at 04/24/20 1255  . TPN ADULT (ION)      Assessment: 34 yoF admitted on 4/13 with SBO.  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  Xarelto was held pending surgical procedures.  She has been on prophylactic Lovenox 40mg  daily.  Per, Saverio Danker, CCS, it would be OK to resume full dose anticoagulation with IV Heparin on 4/26.  No further procedures planned, no s/s bleeding or complications.  Pharmacy is consulted to dose Heparin IV, but no bolus and low end of goal range per Dr. Tyrell Antonio. Most recent Lovenox dose was 4/25 at 1620 CBC: Hgb 10.9, Plt 139 SCr 0.53  Goal of Therapy:  Heparin level 0.3-0.5 units/ml Monitor platelets by anticoagulation protocol: Yes   Plan:   No heparin bolus per MD  Start heparin IV infusion at 900 units/hr  Heparin level 6 hours after starting  Daily heparin level and CBC  Follow up plans to resume Xarelto when able to tolerate PO.   Gretta Arab PharmD, BCPS Clinical Pharmacist [facility] main pharmacy [phone] 04/24/2020 2:26 PM

## 2020-04-24 NOTE — Progress Notes (Signed)
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  Patient was admitted to the hospital for further evaluation and treatment.  Patient failed to improve with medical management, she underwent oratory laparotomy, repair of multiple serosal tears, small bowel resection, lysis of adhesion, application of a wound VAC on 4/24.  Patient subsequently had exploratory laparotomy, closure of the abdomen with ACell gentry X thick 20 x 30 cm, application of wound VAC on 4/24.    Assessment & Plan:   Principal Problem:   SBO (small bowel obstruction) (HCC) Active Problems:   Abdominal pain   Nausea & vomiting   Acute hyponatremia   AKI (acute kidney injury) ([place])   Leukocytosis   1-Small bowel obstruction: with perforation: -Underwent exploratory laparotomy, repair of multiple serosal tears, small bowel resection, lysis of adhesion and application of wound VAC on 4/21st. -Patient subsequently had exploratory laparotomy, closure of the abdomen with ACell gentry X thick 20 x 30 cm, application of wound VAC on 4/24.  -N.p.o., still with NG tube in place.  -underwent closure  her abdominal wall wound 4/24 -on  TPN.  -On cipro and flagyl.  -WBC down today.   2- Coffee Ground fluid from NG tube; fluid clear.  Start IV protonix.  Hb stable.    3-Acute hypoxic respiratory failure: Postop, and use of opioids. Chest X ray: Showed low lung volumes. Incentive spirometry. Wean off of oxygen as tolerated. Of NB mask Oxygen 88--90 on RA.  Stable.   4-AKI: During this admission up to 1.6. Improved with IV fluids.  Metabolic alkalosis: Secondary to GI losses. On IV Fluids  Hypokalemia:  Resolved.  Leukocytosis: Related to #1  Metastatatic colon cancer: Follow with Dr Benay Spice.   Pancreatic duct dilation Incidental finding on CT abdomen/pelvis. No mass lesion noted. Patient is currently asymptomatic. Lipase minimally elevated.  Need to monitor as outpatient.  IVC infra-renal thrombus; Was on Xarelto as an outpatient. resume when okay by surgery. Hopefully tomorrow. Heparin gtt today/  Hyponatremia: Resolved  with IV fluids Hypomagnesemia; IV magnesium   Nutrition Problem: Inadequate oral intake Etiology: inability to eat    Signs/Symptoms: NPO status    Interventions: TPN  Estimated body mass index is 19.64 kg/m as calculated from the following:   Height as of this encounter: 5\' 6"  (1.676 m).   Weight as of this encounter: 55.2 kg.   DVT prophylaxis: Lovenox on hold, due to coffee ground fluid from NGT Code Status: DNR Family Communication: Husband at bedside Disposition Plan:  Patient is from: Home  Anticipated d/c date: 4--5 days depend on recovery from surgery  Barriers to d/c or necessity for inpatient status: Patient is still requiring IV pain medication, still with NG tube and not tolerating diet  Consultants:   Surgery  Procedures:  t exploratory laparotomy, repair of multiple serosal tears, small bowel resection, lysis of adhesion and application of wound VAC on 4/21st.    Antimicrobials:    Subjective: Report abdominal pain, pain medication has been helping.  She denies dyspnea.  She is passing some flatus.  Objective: Vitals:   04/24/20 0914 04/24/20 1000 04/24/20 1146 04/24/20 1200  BP: 130/70  (!) 144/90   Pulse: 88 78 95 100  Resp: (!) 31 15 (!) 22 (!) 27  Temp:    (!) 96.9 F (36.1 C)  TempSrc:    Axillary  SpO2: 96% 100% 97% 100%  Weight:      Height:        Intake/Output Summary (Last 24 hours) at 04/24/2020 1420 Last data filed at 04/24/2020 1255 Gross per 24 hour  Intake 5213.22 ml  Output 3500 ml  Net 1713.22  ml   Filed Weights   04/21/20 1256 04/23/20 0500 04/24/20 0500  Weight: 53.6 kg 53.3 kg 55.2 kg    Examination:  General exam: Chronic ill Appearing Respiratory system: CTA Cardiovascular system:, S 1, S 2 RRR Gastrointestinal system: BS present, soft, wound vac in place Central nervous system; Alert, follows command Extremities: No edema     Data Reviewed: I have personally reviewed following labs and imaging studies  CBC: Recent Labs  Lab 04/18/20 0456 04/18/20 0456 04/20/20 0507 04/21/20 0821 04/22/20 0702 04/23/20 0738 04/24/20 0517  WBC 11.1*   < > 16.8* 20.8* 23.3* 17.5* 13.6*  NEUTROABS 9.0*  --   --   --   --   --  10.7*  HGB 13.2   < > 11.5* 11.8* 12.0 11.7* 10.9*  HCT 41.7   < > 35.6* 35.3* 38.3 37.0 33.8*  MCV 106.4*   < > 106.9* 104.7* 106.7* 106.0* 103.4*  PLT 260   < > 148* 224 143* 156 139*   < > = values in this interval not displayed.   Basic Metabolic Panel: Recent Labs  Lab 04/20/20 0507 04/21/20 0448 04/22/20 0702 04/23/20 0317 04/23/20 0644 04/24/20 0517  NA 136  136 138 136  --  136 134*  133*  K 4.5  4.5 4.5 5.2*  --  4.8 4.3  4.2  CL 101  101 103 105  --  104 104  104  CO2 27  27 27 25   --  26 23  23   GLUCOSE 257*  257* 128* 172*  --  126* 141*  143*  BUN 21  21 20 18   --  17 16  16   CREATININE 0.58  0.65 0.51 0.60  --  0.48 0.53  0.51  CALCIUM 7.0*  6.9* 7.7* 7.6*  --  7.8* 7.7*  7.6*  MG 1.7 1.6* 1.8 1.8  --  1.5*  PHOS 3.7  3.7 2.5 3.6  3.5  --  3.3 3.8  3.8   GFR: Estimated Creatinine Clearance: 59.5 mL/min (by C-G formula based on SCr of 0.53 mg/dL). Liver Function Tests: Recent Labs  Lab 04/18/20 0456 04/19/20 0540 04/20/20 0507 04/21/20 0448 04/22/20 0702 04/23/20 0644 04/24/20 0517  AST 12*  --  10*  --   --   --  14*  ALT 9  --  9  --   --   --  11  ALKPHOS 58  --  56  --   --   --  102  BILITOT 0.6  --  0.3  --   --   --  0.4  PROT 5.8*  --  4.8*  --   --   --  5.4*  ALBUMIN 3.0*  2.9*    < > 2.2*  2.2* 2.2* 2.4* 2.3* 2.0*  2.0*   < > = values in this interval not displayed.   No results for input(s): LIPASE, AMYLASE in the last 168 hours. No results for input(s): AMMONIA in the last 168 hours. Coagulation Profile: No results for input(s):  INR, PROTIME in the last 168 hours. Cardiac Enzymes: No results for input(s): CKTOTAL, CKMB, CKMBINDEX, TROPONINI in the last 168 hours. BNP (last 3 results) No results for input(s): PROBNP in the last 8760 hours. HbA1C: No results for input(s): HGBA1C in the last 72 hours. CBG: Recent Labs  Lab 04/23/20 1145 04/23/20 1737 04/24/20 0004 04/24/20 0615 04/24/20 1149  GLUCAP 130* 122* 112* 140* 101*   Lipid Profile: Recent Labs    04/24/20 0517  TRIG 78   Thyroid Function Tests: No results for input(s): TSH, T4TOTAL, FREET4, T3FREE, THYROIDAB in the last 72 hours. Anemia Panel: No results for input(s): VITAMINB12, FOLATE, FERRITIN, TIBC, IRON, RETICCTPCT in the last 72 hours. Sepsis Labs: No results for input(s): PROCALCITON, LATICACIDVEN in the last 168 hours.  Recent Results (from the past 240 hour(s))  Surgical pcr screen     Status: None   Collection Time: 04/19/20  2:13 AM   Specimen: Nasal Mucosa; Nasal Swab  Result Value Ref Range Status   MRSA, PCR NEGATIVE NEGATIVE Final   Staphylococcus aureus NEGATIVE NEGATIVE Final    Comment: (NOTE) The Xpert SA Assay (FDA approved for NASAL specimens in patients 9 years of age and older), is one component of a comprehensive surveillance program. It is not intended to diagnose infection nor to guide or monitor treatment. Performed at [facility], [place] [address] [place], [place] [address]          Radiology Studies: No results found.      Scheduled Meds: . Chlorhexidine Gluconate Cloth  6 each Topical Daily  . enoxaparin (LOVENOX) injection  40 mg Subcutaneous Q24H  . insulin aspart  0-9 Units Subcutaneous Q6H  . mouth rinse  15 mL  Mouth Rinse BID  . pantoprazole (PROTONIX) IV  40 mg Intravenous Q12H  . sodium chloride flush  10-40 mL Intracatheter Q12H  . sodium chloride flush  3 mL Intravenous Q12H   Continuous Infusions: . ciprofloxacin Stopped (04/24/20 1021)  . metronidazole Stopped (04/24/20 1124)  . TPN ADULT (ION) 75 mL/hr at 04/24/20 1255  . TPN ADULT (ION)       LOS: 13 days    Time spent: 35 minutes.     Elmarie Shiley, MD [facility]   If 7PM-7AM, please contact night-coverage [email or web address]  04/24/2020, 2:20 PM

## 2020-04-24 NOTE — Progress Notes (Signed)
Patient ID: Yolanda Watson, female   DOB: 01-19-1951, 69 y.o.   MRN: [id number]    3 Days Post-Op  Subjective: Feeling ok.  Having some abdominal pain, but also had some flatus/small BM as well.  Got up yesterday once.  ROS: See above, otherwise other systems negative  Objective: Vital signs in last 24 hours: Temp:  [97.4 F (36.3 C)-98.1 F (36.7 C)] 97.4 F (36.3 C) (04/26 0800) Pulse Rate:  [78-102] 88 (04/26 0914) Resp:  [14-31] 31 (04/26 0914) BP: (130-147)/(70-88) 130/70 (04/26 0914) SpO2:  [91 %-100 %] 96 % (04/26 0914) Weight:  [55.2 kg] 55.2 kg (04/26 0500) Last BM Date: 04/13/20  Intake/Output from previous day: 04/25 0701 - 04/26 0700 In: 4035.7 [I.V.:3424; IV Piggyback:611.8] Out: 3500 [Urine:3250; Emesis/NG output:200; Drains:50] Intake/Output this shift: No intake/output data recorded.  PE: Abd: soft, tender as expected, VAC in place, NGT with mostly just clear output and +BS  Lab Results:  Recent Labs    04/23/20 0738 04/24/20 0517  WBC 17.5* 13.6*  HGB 11.7* 10.9*  HCT 37.0 33.8*  PLT 156 139*   BMET Recent Labs    04/23/20 0644 04/24/20 0517  NA 136 134*  133*  K 4.8 4.3  4.2  CL 104 104  104  CO2 26 23  23   GLUCOSE 126* 141*  143*  BUN 17 16  16   CREATININE 0.48 0.53  0.51  CALCIUM 7.8* 7.7*  7.6*   PT/INR No results for input(s): LABPROT, INR in the last 72 hours. CMP     Component Value Date/Time   NA 134 (L) 04/24/2020 0517   NA 133 (L) 04/24/2020 0517   NA 141 01/15/2018 0000   NA 138 08/07/2017 0835   K 4.3 04/24/2020 0517   K 4.2 04/24/2020 0517   K 4.1 08/07/2017 0835   CL 104 04/24/2020 0517   CL 104 04/24/2020 0517   CO2 23 04/24/2020 0517   CO2 23 04/24/2020 0517   CO2 25 08/07/2017 0835   GLUCOSE 141 (H) 04/24/2020 0517   GLUCOSE 143 (H) 04/24/2020 0517   GLUCOSE 95 08/07/2017 0835   BUN 16 04/24/2020 0517   BUN 16 04/24/2020 0517   BUN 4 01/15/2018 0000   BUN 9.3 08/07/2017 0835   CREATININE 0.53  04/24/2020 0517   CREATININE 0.51 04/24/2020 0517   CREATININE 0.80 03/16/2020 1002   CREATININE 0.57 02/05/2018 1144   CREATININE 0.7 08/07/2017 0835   CALCIUM 7.7 (L) 04/24/2020 0517   CALCIUM 7.6 (L) 04/24/2020 0517   CALCIUM 9.4 08/07/2017 0835   PROT 5.4 (L) 04/24/2020 0517   PROT 7.3 08/07/2017 0835   ALBUMIN 2.0 (L) 04/24/2020 0517   ALBUMIN 2.0 (L) 04/24/2020 0517   ALBUMIN 3.4 (L) 08/07/2017 0835   AST 14 (L) 04/24/2020 0517   AST 13 (L) 03/16/2020 1002   AST 25 08/07/2017 0835   ALT 11 04/24/2020 0517   ALT 11 03/16/2020 1002   ALT 21 08/07/2017 0835   ALKPHOS 102 04/24/2020 0517   ALKPHOS 138 08/07/2017 0835   BILITOT 0.4 04/24/2020 0517   BILITOT 0.4 03/16/2020 1002   BILITOT 0.49 08/07/2017 0835   GFRNONAA >60 04/24/2020 0517   GFRNONAA >60 04/24/2020 0517   GFRNONAA >60 03/16/2020 1002   GFRAA >60 04/24/2020 0517   GFRAA >60 04/24/2020 0517   GFRAA >60 03/16/2020 1002   Lipase     Component Value Date/Time   LIPASE 59 (H) 04/11/2020 1542  Studies/Results: No results found.  Anti-infectives: Anti-infectives (From admission, onward)   Start     Dose/Rate Route Frequency Ordered Stop   04/20/20 1000  ciprofloxacin (CIPRO) IVPB 400 mg     400 mg 200 mL/hr over 60 Minutes Intravenous Every 12 hours 04/20/20 0856     04/20/20 1000  metroNIDAZOLE (FLAGYL) IVPB 500 mg     500 mg 100 mL/hr over 60 Minutes Intravenous Every 8 hours 04/20/20 0856     04/19/20 0600  ciprofloxacin (CIPRO) IVPB 400 mg     400 mg 200 mL/hr over 60 Minutes Intravenous On call to O.R. 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 Benay Spice -Excision of malignant mass from the abdominal including skin, muscle, fascia and peritoneum(15 x 12cm), abdominal  reconstruction with [name], 10/03/17 Dr. Barry Dienes and [name]   POD5/3, s/p ex lap with SBR for contained perforation fromSBO and takeback for abdominal wall closure, Dr. Marlou Starks -will do bedside VAC change this afternoon -cont abx for at least 5 days for source control, WBC down to 13 from 17 -clamp NGT and likely remove later today or in am -mobilize, PT/OT -cont TNA  FEN -NPO/NGT(clamp)/IVFs/TNA VTE -Lovenox ID -cipro/Flagyl 4/22 -->   LOS: 13 days    Henreitta Cea , [facility] 04/24/2020, 9:35 AM Please see Amion for pager number during day hours 7:00am-4:30pm or 7:00am -11:30am on weekends

## 2020-04-24 NOTE — Evaluation (Signed)
Occupational Therapy Evaluation Patient Details Name: Yolanda Watson MRN: [id number] DOB: Jun 29, 1951 Today's Date: 04/24/2020    History of Present Illness 69 year old with past medical history significant for metastatic colon cancer on chemotherapy, chronic tobacco abuse, abdominal wall thrombosis on Xarelto presented to the hospital with complaints of abdominal pain and found to have an SBO.  Patient was admitted to the hospital for further evaluation and treatment.  Patient failed to improve with medical management, she underwent oratory laparotomy, repair of multiple serosal tears, small bowel resection, lysis of adhesion, application of a wound VAC on 4/24.  Patient subsequently had exploratory laparotomy, closure of the abdomen with ACell gentry X thick 20 x 30 cm, application of wound VAC on 4/24.   Clinical Impression   Yolanda Watson is a normally independent woman s/p small bowel resection and on hospital admission day 13. On evaluation patient presents with generalized weakness, poor activity tolerance, impaired balance and complaints of pain resulting in decreased ability to perform functional mobility and ADLs. Her functional abilities are also compounded by a history of flexor tendon injuries that resulted in decreased ROM/strength of bilateral wrists and contractures of digits bilaterally.  Patient will benefit from skilled OT services to improve deficits and return to PLOF At this time therapist recommends short term rehab and patient is adamantly refusing and wants to see how she does tomorrow.      Follow Up Recommendations  SNF(OT discussed POC with patient. Therapist recommends short term rehab. Patient adamantly refusing. [place] OT if continues to refuse SNF.)    Equipment Recommendations  3 in 1 bedside commode    Recommendations for Other Services       Precautions / Restrictions Precautions Precautions: Fall Restrictions Weight Bearing Restrictions: No      Mobility  Bed Mobility Overal bed mobility: Needs Assistance Bed Mobility: Supine to Sit;Rolling;Sit to Supine Rolling: Min assist   Supine to sit: Mod assist Sit to supine: Mod assist   General bed mobility comments: Seated at side of bed to work on improving activity tolerance OOB.  Transfers Overall transfer level: Needs assistance   Transfers: Sit to/from Stand Sit to Stand: +2 physical assistance(unable to come into standing. Patient fearful and in pain. Patient's feet sliding forward and unable to maintain in a safe position for standing.)              Balance Overall balance assessment: Needs assistance Sitting-balance support: Bilateral upper extremity supported Sitting balance-Leahy Scale: Poor                                     ADL either performed or assessed with clinical judgement   ADL Overall ADL's : Needs assistance/impaired Eating/Feeding: Set up Eating/Feeding Details (indicate cue type and reason): set up to sip water. Grooming: Maximal assistance;Set up Grooming Details (indicate cue type and reason): Patient sat at side of bed predominanty propping herself with bilateral upper extremities. Attempted to comb hair but unable to maintain grasp on comb due to chronic hand deficits - therapist had to perform task. Upper Body Bathing: Moderate assistance;Bed level;Set up   Lower Body Bathing: Moderate assistance;Bed level;Set up   Upper Body Dressing : Maximal assistance;Sitting;Set up   Lower Body Dressing: Set up;Total assistance;Sit to/from stand   Toilet Transfer: BSC;Stand-pivot;+2 for physical assistance   Toileting- Water quality scientist and Hygiene: +2 for physical assistance;Sit to/from stand   Scientist, research (medical): +  2 for physical assistance;Shower seat   Functional mobility during ADLs: +2 for physical assistance General ADL Comments: Patient will need specific equipment and clothing to perform ADLs due to limitations from chronic  bilateral hand injury resulting in need for build up handles, loose clothing. Patient unable to perform fasteners/laces at baseline.     Vision Baseline Vision/History: No visual deficits Patient Visual Report: No change from baseline Vision Assessment?: No apparent visual deficits     Perception     Praxis      Pertinent Vitals/Pain Pain Assessment: 0-10 Pain Score: 8  Pain Location: Abdomen Pain Descriptors / Indicators: Grimacing;Aching;Guarding Pain Intervention(s): Limited activity within patient's tolerance     Hand Dominance     Extremity/Trunk Assessment Upper Extremity Assessment Upper Extremity Assessment: RUE deficits/detail;LUE deficits/detail RUE Deficits / Details: Unable to formally assess strength of UE secondary to complaints of pain - though functional in shoulder/elbow. Impaired ROM/strength of wrist/fingers from flexor tendon injury in the 80s. Contractures of PIPs/DIPs LUE Deficits / Details: Unable to formally assess strength of UE secondary to complaints of pain - though functional in shoulder/elbow. Impaired ROM/strength of wrist/fingers from flexor tendon injury in the 80s. Contractures of PIPs/DIPs   Lower Extremity Assessment Lower Extremity Assessment: Defer to PT evaluation   Cervical / Trunk Assessment Cervical / Trunk Assessment: Normal   Communication Communication Communication: No difficulties   Cognition Arousal/Alertness: Awake/alert Behavior During Therapy: WFL for tasks assessed/performed Overall Cognitive Status: Within Functional Limits for tasks assessed                                     General Comments       Exercises     Shoulder Instructions      Home Living Family/patient expects to be discharged to:: Private residence Living Arrangements: Spouse/significant other Available Help at Discharge: Family Type of Home: Apartment Home Access: Stairs to enter [employer] of Steps: Reports 6-9  steps to enter   Home Layout: One level     Bathroom Shower/Tub: Teacher, early years/pre: Standard                Prior Functioning/Environment Level of Independence: Independent                 OT Problem List: Decreased strength;Decreased activity tolerance;Impaired balance (sitting and/or standing);Decreased coordination;Impaired UE functional use;Pain      OT Treatment/Interventions: Self-care/ADL training;Therapeutic exercise;Therapeutic activities;Patient/family education    OT Goals(Current goals can be found in the care plan section) Acute Rehab OT Goals Patient Stated Goal: Patient wants to go home at discharge. Time For Goal Achievement: 05/08/20 Potential to Achieve Goals: Fair  OT Frequency: Min 2X/week   Barriers to D/C: Inaccessible home environment          Co-evaluation              AM-PAC OT "6 Clicks" Daily Activity     Outcome Measure Help from another person eating meals?: A Little Help from another person taking care of personal grooming?: A Lot Help from another person toileting, which includes using toliet, bedpan, or urinal?: Total Help from another person bathing (including washing, rinsing, drying)?: A Lot Help from another person to put on and taking off regular upper body clothing?: A Lot Help from another person to put on and taking off regular lower body clothing?: Total 6 Click Score: 11  End of Session Nurse Communication: Mobility status  Activity Tolerance: Patient limited by pain;Patient limited by fatigue Patient left: in bed;with call bell/phone within reach;with bed alarm set  OT Visit Diagnosis: Unsteadiness on feet (R26.81);Muscle weakness (generalized) (M62.81);Pain                Time: [id number] OT Time Calculation (min): 37 min Charges:  OT General Charges $OT Visit: 1 Visit OT Evaluation $OT Eval Moderate Complexity: 1 Mod OT Treatments $Self Care/Home Management : 8-22 mins  Derl Barrow,  OTR/L Acute Care Rehab Services  Office [phone]   Lenward Chancellor 04/24/2020, 1:34 PM

## 2020-04-24 NOTE — Progress Notes (Addendum)
Nutrition Follow-up  DOCUMENTATION CODES:   Underweight  INTERVENTION:  - continue TPN per Pharmacist. - will monitor for diet advancement.  - will complete NFPE at follow-up.   NUTRITION DIAGNOSIS:   Inadequate oral intake related to inability to eat as evidenced by NPO status. -ongoing  GOAL:   Patient will meet greater than or equal to 90% of their needs -met with TPN regimen  MONITOR:   Diet advancement, Labs, Weight trends, I & O's, Other (Comment)(TPN regimen)  ASSESSMENT:   69 y.o. 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 Patient presented 2/2 to abdominal pain and found to have a SBO.  Significant Events: 4/13- admission; NGT placed in R nare 4/19- TPN initiation via R chest port 4/21- ex lap, repair of multiple serosal tears, small bowel resection, LOAs, wound vac placement 4/23- ex lap, closure of abdomen, wound vac placement   Patient is sleeping at this time with no family/visitors present. Did not wake her up. Noted NGT is clamped. Able to talk with RN about patient.  Weight was stable 4/13-4/19 and then trending up since that time. Weight today is + 7.9 kg/18 lb compared to admission weight. Estimated nutrition needs updated d/t surgeries.   She is currently receiving custom TPN @ 75 ml/hr which is providing 1832 kcal (97% re-estimated kcal need) and 94 grams protein (89% re-estimated protein need).    Per notes: - post-op ileus is resolving - NGT clamped and plan for removal later today and to begin advancing diet   Labs reviewed; CBGs: 112, 140, 101 mg/dl, Na: 134 mmol/l, Ca: 7.7 mg/dl, Mg: 1.5 mg/dl. Medications reviewed; sliding scale novolog, 2 g IV Mg sulfate x1 run 4/26, 40 mg IV protonix BID.    NUTRITION - FOCUSED PHYSICAL EXAM:  unable to complete at this time.   Diet Order:   Diet Order            Diet NPO time specified Except for:  Ice Chips  Diet effective now              EDUCATION NEEDS:   Not appropriate for education at this time  Skin:  Skin Assessment: Skin Integrity Issues: Skin Integrity Issues:: Incisions Incisions: abdomen (4/21 + 4/23)  Last BM:  4/26  Height:   Ht Readings from Last 1 Encounters:  04/12/20 '5\' 6"'$  (1.676 m)    Weight:   Wt Readings from Last 1 Encounters:  04/24/20 55.2 kg    Ideal Body Weight:  59.1 kg  BMI:  Body mass index is 19.64 kg/m.  Estimated Nutritional Needs:   Kcal:  1895-2130 kcal  Protein:  105-120 grams  Fluid:  >/= 2 L/day     Jarome Matin, MS, RD, LDN, CNSC Inpatient Clinical Dietitian RD pager # available in AMION  After hours/weekend pager # available in [facility]

## 2020-04-24 NOTE — Progress Notes (Signed)
ANTICOAGULATION CONSULT NOTE - Initial Consult  Pharmacy Consult for Heparin IV Indication: IVC thrombus  Allergies  Allergen Reactions  . Penicillins Other (See Comments)    Tolerated cefepime 01/01/2018 "PASSED OUT AS A CHILD" > ? SYNCOPE ? PATIENT HAS HAD A PCN REACTION WITH IMMEDIATE RASH, FACIAL/TONGUE/THROAT SWELLING, SOB, OR LIGHTHEADEDNESS WITH HYPOTENSION:  #  #  #  YES  #  #  #   Has patient had a PCN reaction causing severe rash involving mucus membranes or skin necrosis: No Has patient had a PCN reaction that required hospitalization: No Has patient had a PCN reaction occurring within the last 10 years: No   . Latex Itching and Rash    BED "CHUX PADS"    Patient Measurements: Height: 5\' 6"  (167.6 cm) Weight: 55.2 kg (121 lb 11.1 oz) IBW/kg (Calculated) : 59.3 Heparin Dosing Weight: TBW  Vital Signs: Temp: 97.4 F (36.3 C) (04/26 1944) Temp Source: Oral (04/26 1944) BP: 147/86 (04/26 1548) Pulse Rate: 98 (04/26 1548)  Labs: Recent Labs    04/22/20 0702 04/22/20 0702 04/23/20 0644 04/23/20 0738 04/24/20 0517 04/24/20 2100  HGB 12.0   < >  --  11.7* 10.9*  --   HCT 38.3  --   --  37.0 33.8*  --   PLT 143*  --   --  156 139*  --   HEPARINUNFRC  --   --   --   --   --  <0.10*  CREATININE 0.60  --  0.48  --  0.53  0.51  --    < > = values in this interval not displayed.    Estimated Creatinine Clearance: 59.5 mL/min (by C-G formula based on SCr of 0.53 mg/dL).   Medical History: Past Medical History:  Diagnosis Date  . Acquired bilateral hand deformity    both hands with severe digit contracture. VERY limited ROM   . Alcohol abuse   . Colon cancer ([place]) 2016   metastatic to abdominal wall  . Port-A-Cath in place since 04-2017   right chest     Medications:  Scheduled:  . Chlorhexidine Gluconate Cloth  6 each Topical Daily  . insulin aspart  0-9 Units Subcutaneous Q6H  . mouth rinse  15 mL Mouth Rinse BID  . pantoprazole (PROTONIX) IV  40 mg  Intravenous Q12H  . sodium chloride flush  10-40 mL Intracatheter Q12H  . sodium chloride flush  3 mL Intravenous Q12H   Infusions:  . ciprofloxacin Stopped (04/24/20 1021)  . heparin 900 Units/hr (04/24/20 1800)  . metronidazole Stopped (04/24/20 1857)  . TPN ADULT (ION) 75 mL/hr at 04/24/20 1800    Assessment: 89 yoF admitted on 4/13 with SBO.  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  Xarelto was held pending surgical procedures.  She has been on prophylactic Lovenox 40mg  daily.  Per, Saverio Danker, CCS, it would be OK to resume full dose anticoagulation with IV Heparin on 4/26.  No further procedures planned, no s/s bleeding or complications.  Pharmacy is consulted to dose Heparin IV, but no bolus and low end of goal range per Dr. Tyrell Antonio. Most recent Lovenox dose was 4/25 at 1620 CBC: Hgb 10.9, Plt 139 SCr 0.53  04/24/20 9:54 PM  - HL < 0.1 - infusion has been running without issue per RN - no bleeding reported  Goal of Therapy:  Heparin level 0.3-0.5 units/ml Monitor platelets by anticoagulation protocol: Yes   Plan:   Increase heparin infusion  to 1100 units/hr  Heparin level 6 hours after rate adjustment  Daily heparin level and CBC  Follow up plans to resume Xarelto when able to tolerate PO.   Ulice Dash, PharmD, BCPS 04/24/2020 9:53 PM

## 2020-04-24 NOTE — Progress Notes (Signed)
PHARMACY - TOTAL PARENTERAL NUTRITION CONSULT NOTE   Indication: small bowel obstruction  Patient Measurements: Height: 5\' 6"  (167.6 cm) Weight: 55.2 kg (121 lb 11.1 oz) IBW/kg (Calculated) : 59.3   Body mass index is 19.64 kg/m.  Assessment: 45 y/oF with PMH of metastatic colon cancer, abdominal wall thrombus who presented on 04/11/2020 secondary to abdominal pain and found to have SBO, perforation. Pharmacy consulted for TPN.   Glucose / Insulin: No hx of DM, CBG at goal <150.  Continue sensitive SSI q6h. 3 units administered in past 24h. Electrolytes:  Na 133, Mag 1.5, others WNL Renal: SCr/BUN WNL, stable LFTs / TGs: LFTs WNL (4/26)/Triglycerides 78(4/26) Prealbumin: 14.7 (4/16), < 5 (4/20), 7.9 (4/26) Intake / Output; MIVF: 24h I/O net: +535 mL. NG out 252ml/drain output 50 mL.  NS at 100 mL/hr GI Imaging:  -4/13 CT abd/pelvis: SBO with transition point in the mid abdomen -4/19 AXR: Mild improvement in the pattern of partial small bowel obstruction with interval evidence of mucosal thickening involving the dilated loops of jejunum without pneumatosis.  Surgeries / Procedures:  -4/21: Ex lap with small bowel resection & wound vac -4/23: Ex lap with abdominal closure & washout & wound vac  Central access: port  TPN start date: 04/17/2020  Nutritional Goals: per RD recommendation on 4/20: Kcal: 1735 - 1900, Protein: 85-95 g, Fluid: >/= 1.8 L/day  Custom TPN at goal rate of 75 mL/hr will provide 94 g protein, 270 g dextrose, 1832 kcal; meeting 100% of patient needs.   Current Nutrition:  -TPN -Diet: NPO except ice chips - NGT: clamped, removal on 4/26-4/27  Plan:  Now:  Mag 2g bolus IV x1 per MD   At 1800:  Continue TPN at 75 mL/hr  Electrolytes in TPN:   Inc to 75 mEq/L of Na, cont 34mEq/L of K, cont 21mEq/L of Ca, inc to 78mEq/L of Mag, and cont 13mmol/L of Phos.   1:1 ratio Cl:Ac  Standard MVI and trace elements to TPN.   Continue Sensitive q6h SSI and adjust  as needed.    D/C NS 100 ml/hr per CCS.  Monitor TPN labs on Mon/Thurs.    Gretta Arab PharmD, BCPS Clinical Pharmacist [facility] main pharmacy [phone] 04/24/2020 8:11 AM

## 2020-04-25 DIAGNOSIS — K56609 Unspecified intestinal obstruction, unspecified as to partial versus complete obstruction: Secondary | ICD-10-CM | POA: Diagnosis not present

## 2020-04-25 LAB — GLUCOSE, CAPILLARY
Glucose-Capillary: 112 mg/dL — ABNORMAL HIGH (ref 70–99)
Glucose-Capillary: 126 mg/dL — ABNORMAL HIGH (ref 70–99)
Glucose-Capillary: 130 mg/dL — ABNORMAL HIGH (ref 70–99)
Glucose-Capillary: 150 mg/dL — ABNORMAL HIGH (ref 70–99)

## 2020-04-25 LAB — CBC
HCT: 32.8 % — ABNORMAL LOW (ref 36.0–46.0)
Hemoglobin: 10.5 g/dL — ABNORMAL LOW (ref 12.0–15.0)
MCH: 33.4 pg (ref 26.0–34.0)
MCHC: 32 g/dL (ref 30.0–36.0)
MCV: 104.5 fL — ABNORMAL HIGH (ref 80.0–100.0)
Platelets: 106 10*3/uL — ABNORMAL LOW (ref 150–400)
RBC: 3.14 MIL/uL — ABNORMAL LOW (ref 3.87–5.11)
RDW: 13.5 % (ref 11.5–15.5)
WBC: 16.6 10*3/uL — ABNORMAL HIGH (ref 4.0–10.5)
nRBC: 0 % (ref 0.0–0.2)

## 2020-04-25 LAB — RENAL FUNCTION PANEL
Albumin: 2.1 g/dL — ABNORMAL LOW (ref 3.5–5.0)
Anion gap: 7 (ref 5–15)
BUN: 16 mg/dL (ref 8–23)
CO2: 24 mmol/L (ref 22–32)
Calcium: 7.8 mg/dL — ABNORMAL LOW (ref 8.9–10.3)
Chloride: 103 mmol/L (ref 98–111)
Creatinine, Ser: 0.51 mg/dL (ref 0.44–1.00)
GFR calc Af Amer: >60 mL/min (ref >60)
GFR calc non Af Amer: >60 mL/min (ref >60)
Glucose, Bld: 119 mg/dL — ABNORMAL HIGH (ref 70–99)
Phosphorus: 4 mg/dL (ref 2.5–4.6)
Potassium: 4.7 mmol/L (ref 3.5–5.1)
Sodium: 134 mmol/L — ABNORMAL LOW (ref 135–145)

## 2020-04-25 LAB — MAGNESIUM: Magnesium: 1.9 mg/dL (ref 1.7–2.4)

## 2020-04-25 LAB — HEPARIN LEVEL (UNFRACTIONATED): Heparin Unfractionated: 0.22 IU/mL — ABNORMAL LOW (ref 0.30–0.70)

## 2020-04-25 MED ORDER — RIVAROXABAN 10 MG PO TABS
20.0000 mg | ORAL_TABLET | Freq: Every day | ORAL | Status: DC
  Administered 2020-04-25 – 2020-05-03 (×9): 20 mg via ORAL
  Filled 2020-04-25 (×8): qty 2
  Filled 2020-04-25: qty 1

## 2020-04-25 MED ORDER — BISACODYL 10 MG RE SUPP
10.0000 mg | Freq: Once | RECTAL | Status: DC
  Filled 2020-04-25 (×3): qty 1

## 2020-04-25 MED ORDER — TRAVASOL 10 % IV SOLN
INTRAVENOUS | Status: AC
  Filled 2020-04-25: qty 1152

## 2020-04-25 MED ORDER — HEPARIN (PORCINE) 25000 UT/250ML-% IV SOLN: 1200.0000 [IU]/h | INTRAVENOUS | Status: AC

## 2020-04-25 MED ORDER — HYDROMORPHONE HCL 1 MG/ML IJ SOLN
1.0000 mg | INTRAMUSCULAR | Status: DC | PRN
  Administered 2020-04-25 – 2020-04-27 (×15): 1 mg via INTRAVENOUS
  Filled 2020-04-25 (×15): qty 1

## 2020-04-25 NOTE — Progress Notes (Signed)
PHARMACY - TOTAL PARENTERAL NUTRITION CONSULT NOTE   Indication: small bowel obstruction, perforation  Patient Measurements: Height: 5\' 6"  (167.6 cm) Weight: 55.2 kg (121 lb 11.1 oz) IBW/kg (Calculated) : 59.3 TPN AdjBW (KG): 55.2 Body mass index is 19.64 kg/m.  Assessment: 71 y/oF with PMH of metastatic colon cancer, abdominal wall thrombus who presented on 04/11/2020 secondary to abdominal pain and found to have SBO, perforation. Pharmacy consulted for TPN.   Glucose / Insulin: No hx of DM, CBG near goal <150 (range 101-165).  Sensitive SSI q6h - 3 units administered in past 24h. Electrolytes:  Na 134, K 4.7, Mag 1.9, others WNL Renal: SCr/BUN WNL, stable LFTs / TGs: LFTs WNL (4/26)/Triglycerides 78(4/26) Prealbumin: 14.7 (4/16), < 5 (4/20), 7.9 (4/26) Intake / Output; MIVF: 24h I/O net: -450 mL.  mIVF d/c GI Imaging:  -4/13 CT abd/pelvis: SBO with transition point in the mid abdomen -4/19 AXR: Mild improvement in the pattern of partial small bowel obstruction with interval evidence of mucosal thickening involving the dilated loops of jejunum without pneumatosis.  Surgeries / Procedures:  -4/21: Ex lap with small bowel resection & wound vac -4/23: Ex lap with abdominal closure & washout & wound vac  Central access: port  TPN start date: 04/17/2020  Nutritional Goals: per RD recommendation on 4/26: Kcal:  1895-2130 kcal, Protein:  105-120 grams, Fluid:  >/= 2 L/day  Custom TPN at goal rate of 80 mL/hr will provide 115 g protein, 2016 kcal; meeting 100% of patient needs.   Current Nutrition:  - continue TPN - Diet: sips of liquids today (NGT removed on 4/26)  Plan:  At 1800:  Continue TPN at 80 mL/hr  Electrolytes in TPN:   75 mEq/L of Na, decrease to 40 mEq/L of K, cont 4mEq/L of Ca, inc to 9 mEq/L of Mag, and 15 mmol/L of Phos.   1:1 ratio Cl:Ac  Standard MVI and trace elements to TPN.   Continue Sensitive q6h SSI and adjust as needed.    Monitor TPN labs on  Mon/Thurs.   Monitor PO intake for transition off TPN when meeting more nutritional goals.  Gretta Arab PharmD, BCPS Clinical Pharmacist [facility] main pharmacy [phone] 04/25/2020 8:07 AM

## 2020-04-25 NOTE — Anesthesia Postprocedure Evaluation (Signed)
Anesthesia Post Note  Patient: Yolanda Watson  Procedure(s) Performed: EXPLORATORY LAPAROTOMY (N/A )     Patient location during evaluation: PACU Anesthesia Type: General Level of consciousness: awake and alert Pain management: pain level controlled Vital Signs Assessment: post-procedure vital signs reviewed and stable Respiratory status: spontaneous breathing, nonlabored ventilation, respiratory function stable and patient connected to nasal cannula oxygen Cardiovascular status: blood pressure returned to baseline and stable Postop Assessment: no apparent nausea or vomiting Anesthetic complications: no    Last Vitals:  Vitals:   04/25/20 1155 04/25/20 1244  BP:  117/67  Pulse:  90  Resp:    Temp: 36.6 C 36.4 C  SpO2:  95%    Last Pain:  Vitals:   04/25/20 1244  TempSrc: Oral  PainSc:    Pain Goal: Patients Stated Pain Goal: 2 (04/22/20 1652)                 Concepcion

## 2020-04-25 NOTE — Progress Notes (Signed)
ANTICOAGULATION CONSULT NOTE - Follow Up Consult  Pharmacy Consult for Heparin IV Indication: Hx IVC thrombus (PTA Xarelto held)  Allergies  Allergen Reactions  . Penicillins Other (See Comments)    Tolerated cefepime 01/01/2018 "PASSED OUT AS A CHILD" > ? SYNCOPE ? PATIENT HAS HAD A PCN REACTION WITH IMMEDIATE RASH, FACIAL/TONGUE/THROAT SWELLING, SOB, OR LIGHTHEADEDNESS WITH HYPOTENSION:  #  #  #  YES  #  #  #   Has patient had a PCN reaction causing severe rash involving mucus membranes or skin necrosis: No Has patient had a PCN reaction that required hospitalization: No Has patient had a PCN reaction occurring within the last 10 years: No   . Latex Itching and Rash    BED "CHUX PADS"    Patient Measurements: Height: 5\' 6"  (167.6 cm) Weight: 55.2 kg (121 lb 11.1 oz) IBW/kg (Calculated) : 59.3 Heparin Dosing Weight: TBW  Vital Signs: Temp: 97.4 F (36.3 C) (04/27 0400) Temp Source: Oral (04/27 0400) BP: 121/77 (04/27 0425) Pulse Rate: 87 (04/27 0428)  Labs: Recent Labs    04/22/20 0702 04/22/20 0702 04/23/20 0644 04/23/20 0738 04/24/20 0517 04/24/20 2100  HGB 12.0   < >  --  11.7* 10.9*  --   HCT 38.3  --   --  37.0 33.8*  --   PLT 143*  --   --  156 139*  --   HEPARINUNFRC  --   --   --   --   --  <0.10*  CREATININE 0.60  --  0.48  --  0.53  0.51  --    < > = values in this interval not displayed.    Estimated Creatinine Clearance: 59.5 mL/min (by C-G formula based on SCr of 0.53 mg/dL).   Medications:  Infusions:  . ciprofloxacin Stopped (04/24/20 2313)  . heparin 1,100 Units/hr (04/25/20 0600)  . metronidazole Stopped (04/25/20 0244)  . TPN ADULT (ION) 75 mL/hr at 04/25/20 0600    Assessment: Yolanda Watson admitted on 4/13 with SBO.  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  Xarelto was held pending surgical procedures.  She has been on prophylactic Lovenox 40mg  daily.  Per, Saverio Danker, CCS, it would be OK to resume full dose  anticoagulation with IV Heparin on 4/26.  No further procedures planned, no s/s bleeding or complications.  Pharmacy is consulted to dose Heparin IV, but no bolus and low end of goal range per Dr. Tyrell Antonio.  Today, 04/25/2020: Heparin level 0.22, subtherapeutic on heparin 1100 units/hr. CBC: Hgb remains low/stable at 10.5, Plt decreased to 106k (baseline 150-200s) No bleeding or complications reported  Goal of Therapy:  Heparin level 0.3-0.7 units/ml Monitor platelets by anticoagulation protocol: Yes   Plan:   Increase to heparin IV infusion at 1200 units/hr  Heparin level 6 hours after starting  Daily heparin level and CBC  Follow up plans to resume Xarelto when able to take [place] PharmD, [place] main pharmacy [phone] 04/25/2020 7:05 AM

## 2020-04-25 NOTE — Progress Notes (Signed)
PROGRESS NOTE    Yolanda Watson  [id number] DOB: 04/24/1951 DOA: 04/11/2020 PCP: Patient, No Pcp Per   Brief Narrative: 69 year old with past medical history significant for metastatic colon cancer on chemotherapy, chronic tobacco abuse, abdominal wall thrombosis on Xarelto presented to the hospital with complaints of abdominal pain and found to have an SBO.  Patient was admitted to the hospital for further evaluation and treatment.  Patient failed to improve with medical management, she underwent oratory laparotomy, repair of multiple serosal tears, small bowel resection, lysis of adhesion, application of a wound VAC on 4/24.  Patient subsequently had exploratory laparotomy, closure of the abdomen with ACell gentry X thick 20 x 30 cm, application of wound VAC on 4/24.    Assessment & Plan:   Principal Problem:   SBO (small bowel obstruction) (HCC) Active Problems:   Abdominal pain   Nausea & vomiting   Acute hyponatremia   AKI (acute kidney injury) ([place])   Leukocytosis   1-Small bowel obstruction: with perforation: -Underwent exploratory laparotomy, repair of multiple serosal tears, small bowel resection, lysis of adhesion and application of wound VAC on 4/21st. -Patient subsequently had exploratory laparotomy, closure of the abdomen with ACell gentry X thick 20 x 30 cm, application of wound VAC on 4/24.  -N.p.o., still with NG tube in place.  -underwent closure  her abdominal wall wound 4/24 -on  TPN.  -On cipro and flagyl.  -WBC fluctuates.  Ileus; improving, NG tube out. Plan for another suppository today.   2- Coffee Ground fluid from NG tube; fluid clear.  Started  IV protonix.  Hb stable.    3-Acute hypoxic respiratory failure: Postop, and use of opioids. Chest X ray: Showed low lung volumes. Incentive spirometry. Wean off of oxygen as tolerated. Of NB mask Oxygen 88--90 on RA.  Stable.   4-AKI: During this admission up to 1.6. Improved with IV  fluids.  Metabolic alkalosis: Secondary to GI losses. On IV Fluids  Hypokalemia: Resolved.  Leukocytosis: Related to #1  Metastatatic colon cancer: Follow with Dr Benay Spice.   Pancreatic duct dilation Incidental finding on CT abdomen/pelvis. No mass lesion noted. Patient is currently asymptomatic. Lipase minimally elevated.  Need to monitor as outpatient.  IVC infra-renal thrombus; Was on Xarelto as an outpatient. resume when okay by surgery. Hopefully today, follow sx recommendations. . Heparin gtt today/  Hyponatremia: Resolved  with IV fluids Hypomagnesemia; IV magnesium   Nutrition Problem: Inadequate oral intake Etiology: inability to eat    Signs/Symptoms: NPO status    Interventions: TPN  Estimated body mass index is 19.64 kg/m as calculated from the following:   Height as of this encounter: 5\' 6"  (1.676 m).   Weight as of this encounter: 55.2 kg.   DVT prophylaxis: Lovenox on hold, due to coffee ground fluid from NGT Code Status: DNR Family Communication: Husband at bedside Disposition Plan:  Patient is from: Home  Anticipated d/c date: Home, patient wishes. 69- days depend on recovery from surgery , when tolerates diet, off TPN.  Barriers to d/c or necessity for inpatient status: Patient is still requiring IV pain medication, still with NG tube and not tolerating diet  Consultants:   Surgery  Procedures:  t exploratory laparotomy, repair of multiple serosal tears, small bowel resection, lysis of adhesion and application of wound VAC on 4/21st.    Antimicrobials:    Subjective: She is feeling better, pain is better controlled.  NG tube removed yesterday.  No BM today  Asking for suppository,  helps last time  Objective: Vitals:   04/24/20 2306 04/25/20 0400 04/25/20 0425 04/25/20 0428  BP:   121/77   Pulse:   87 87  Resp:   (!) 21 (!) 21  Temp: 98 F (36.7 C) (!) 97.4 F (36.3 C)    TempSrc: Oral Oral    SpO2:   94% 94%  Weight:       Height:        Intake/Output Summary (Last 24 hours) at 04/25/2020 0744 Last data filed at 04/25/2020 0630 Gross per 24 hour  Intake 3025.54 ml  Output 3475 ml  Net -449.46 ml   Filed Weights   04/21/20 1256 04/23/20 0500 04/24/20 0500  Weight: 53.6 kg 53.3 kg 55.2 kg    Examination:  General exam: Chronic ill appearing.  Respiratory system: CTA Cardiovascular system:, S 1, S 2 RRR Gastrointestinal system: BS decreased, wound vac in place. Incision cover Central nervous system; Alert, oriented.  Extremities: No edema     Data Reviewed: I have personally reviewed following labs and imaging studies  CBC: Recent Labs  Lab 04/21/20 0821 04/22/20 0702 04/23/20 0738 04/24/20 0517 04/25/20 0637  WBC 20.8* 23.3* 17.5* 13.6* 16.6*  NEUTROABS  --   --   --  10.7*  --   HGB 11.8* 12.0 11.7* 10.9* 10.5*  HCT 35.3* 38.3 37.0 33.8* 32.8*  MCV 104.7* 106.7* 106.0* 103.4* 104.5*  PLT 224 143* 156 139* [id number]*   Basic Metabolic Panel: Recent Labs  Lab 04/21/20 0448 04/22/20 0702 04/23/20 0317 04/23/20 0644 04/24/20 0517 04/25/20 0637  NA 138 136  --  136 134*  133* 134*  K 4.5 5.2*  --  4.8 4.3  4.2 4.7  CL 103 105  --  104 104  104 103  CO2 27 25  --  26 23  23 24   GLUCOSE 128* 172*  --  126* 141*  143* 119*  BUN 20 18  --  17 16  16 16   CREATININE 0.51 0.60  --  0.48 0.53  0.51 0.51  CALCIUM 7.7* 7.6*  --  7.8* 7.7*  7.6* 7.8*  MG 1.6* 1.8 1.8  --  1.5* 1.9  PHOS 2.5 3.6  3.5  --  3.3 3.8  3.8 4.0   GFR: Estimated Creatinine Clearance: 59.5 mL/min (by C-G formula based on SCr of 0.51 mg/dL). Liver Function Tests: Recent Labs  Lab 04/20/20 0507 04/20/20 0507 04/21/20 0448 04/22/20 [id number] 04/23/20 [id number] 04/24/20 0517 04/25/20 0637  AST 10*  --   --   --   --  14*  --   ALT 9  --   --   --   --  11  --   ALKPHOS 56  --   --   --   --  102  --   BILITOT 0.3  --   --   --   --  0.4  --   PROT 4.8*  --   --   --   --  5.4*  --   ALBUMIN 2.2*  2.2*   <  > 2.2* 2.4* 2.3* 2.0*  2.0* 2.1*   < > = values in this interval not displayed.   No results for input(s): LIPASE, AMYLASE in the last 168 hours. No results for input(s): AMMONIA in the last 168 hours. Coagulation Profile: No results for input(s): INR, PROTIME in the last 168 hours. Cardiac Enzymes: No results for input(s): CKTOTAL, CKMB, CKMBINDEX, TROPONINI in the last 168  hours. BNP (last 3 results) No results for input(s): PROBNP in the last 8760 hours. HbA1C: No results for input(s): HGBA1C in the last 72 hours. CBG: Recent Labs  Lab 04/24/20 0615 04/24/20 1149 04/24/20 1739 04/24/20 2303 04/25/20 0525  GLUCAP 140* 101* 119* 165* 130*   Lipid Profile: Recent Labs    04/24/20 0517  TRIG 78   Thyroid Function Tests: No results for input(s): TSH, T4TOTAL, FREET4, T3FREE, THYROIDAB in the last 72 hours. Anemia Panel: No results for input(s): VITAMINB12, FOLATE, FERRITIN, TIBC, IRON, RETICCTPCT in the last 72 hours. Sepsis Labs: No results for input(s): PROCALCITON, LATICACIDVEN in the last 168 hours.  Recent Results (from the past 240 hour(s))  Surgical pcr screen     Status: None   Collection Time: 04/19/20  2:13 AM   Specimen: Nasal Mucosa; Nasal Swab  Result Value Ref Range Status   MRSA, PCR NEGATIVE NEGATIVE Final   Staphylococcus aureus NEGATIVE NEGATIVE Final    Comment: (NOTE) The Xpert SA Assay (FDA approved for NASAL specimens in patients 70 years of age and older), is one component of a comprehensive surveillance program. It is not intended to diagnose infection nor to guide or monitor treatment. Performed at [facility], [place] [address], [place], [place] [address]          Radiology Studies: No results found.      Scheduled Meds: . Chlorhexidine Gluconate Cloth  6 each Topical Daily  . insulin aspart  0-9 Units Subcutaneous Q6H  . mouth rinse  15 mL Mouth Rinse BID  . pantoprazole (PROTONIX) IV  40 mg Intravenous  Q12H  . sodium chloride flush  10-40 mL Intracatheter Q12H  . sodium chloride flush  3 mL Intravenous Q12H   Continuous Infusions: . ciprofloxacin Stopped (04/24/20 2313)  . heparin 1,100 Units/hr (04/25/20 0600)  . metronidazole Stopped (04/25/20 0244)  . TPN ADULT (ION) 75 mL/hr at 04/25/20 0600     LOS: 14 days    Time spent: 35 minutes.     Elmarie Shiley, MD [facility]   If 7PM-7AM, please contact night-coverage [email or web address]  04/25/2020, 7:44 AM

## 2020-04-25 NOTE — Progress Notes (Signed)
PT Cancellation Note  Patient Details Name: Yolanda Watson MRN: [id number] DOB: 12-03-1951   Cancelled Treatment:    Reason Eval/Treat Not Completed: Patient declined, no reason specified(Pt resting in bed, RN at bedside assiting with bathing. Pt declines therapy today and states she is not working today. Reports she had an exhausting day yesterday and is supposed to move to another floor today. Requests we come back at later time/date.) Will follow up as schedule allows.   Verner Mould, DPT Physical Therapist with [facility] [phone]  04/25/2020 12:14 PM

## 2020-04-25 NOTE — Progress Notes (Signed)
ANTICOAGULATION CONSULT NOTE - Follow Up Consult  Pharmacy Consult for Xarelto Indication: Hx IVC thrombus  Allergies  Allergen Reactions  . Penicillins Other (See Comments)    Tolerated cefepime 01/01/2018 "PASSED OUT AS A CHILD" > ? SYNCOPE ? PATIENT HAS HAD A PCN REACTION WITH IMMEDIATE RASH, FACIAL/TONGUE/THROAT SWELLING, SOB, OR LIGHTHEADEDNESS WITH HYPOTENSION:  #  #  #  YES  #  #  #   Has patient had a PCN reaction causing severe rash involving mucus membranes or skin necrosis: No Has patient had a PCN reaction that required hospitalization: No Has patient had a PCN reaction occurring within the last 10 years: No   . Latex Itching and Rash    BED "CHUX PADS"    Patient Measurements: Height: 5\' 6"  (167.6 cm) Weight: 55.2 kg (121 lb 11.1 oz) IBW/kg (Calculated) : 59.3 Heparin Dosing Weight: TBW  Vital Signs: Temp: 98.4 F (36.9 C) (04/27 0813) Temp Source: Oral (04/27 0813) BP: 121/77 (04/27 0425) Pulse Rate: 87 (04/27 0428)  Labs: Recent Labs    04/23/20 0644 04/23/20 0738 04/23/20 0738 04/24/20 0517 04/24/20 2100 04/25/20 0637  HGB  --  11.7*   < > 10.9*  --  10.5*  HCT  --  37.0  --  33.8*  --  32.8*  PLT  --  156  --  139*  --  106*  HEPARINUNFRC  --   --   --   --  <0.10* 0.22*  CREATININE 0.48  --   --  0.53  0.51  --  0.51   < > = values in this interval not displayed.    Estimated Creatinine Clearance: 59.5 mL/min (by C-G formula based on SCr of 0.51 mg/dL).   Medications:  Infusions:  . ciprofloxacin Stopped (04/24/20 2313)  . heparin    . metronidazole Stopped (04/25/20 0244)  . TPN ADULT (ION) 75 mL/hr at 04/25/20 0600    Assessment: 73 yoF admitted on 4/13 with SBO.  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  Xarelto was held pending surgical procedures.  She has been on prophylactic Lovenox 40mg  daily.  Per, Saverio Danker, CCS, it would be OK to resume full dose anticoagulation with IV Heparin on 4/26.  No  further procedures planned, no s/s bleeding or complications.  Pharmacy is consulted to transition from heparin to Xarelto.  Today, 04/25/2020: Heparin level 0.22, subtherapeutic on heparin 1100 units/hr. CBC: Hgb remains low/stable at 10.5, Plt decreased to 106k (baseline 150-200s) No bleeding or complications reported  Goal of Therapy:  Heparin level 0.3-0.7 units/ml Monitor platelets by anticoagulation protocol: Yes   Plan:   Stop heparin IV infusion at 10:00  At 10:00, start Xarelto 20mg  PO daily.  Monitor CBC, s/s bleeding or complications.   Gretta Arab PharmD, BCPS Clinical Pharmacist [facility] main pharmacy [phone] 04/25/2020 8:55 AM

## 2020-04-25 NOTE — TOC Initial Note (Addendum)
Transition of Care [facility]) - Initial/Assessment Note    Patient Details  Name: CAROLL PRIMMER MRN: [id number] Date of Birth: 05/03/1951  Transition of Care [facility]) CM/SW Contact:    Leeroy Cha, RN Phone Number: 04/25/2020, 8:43 AM  Clinical Narrative:                 [facility], RN, PT OT HHA and SW [facility] can not do due to staffing, sent to [employer] [employer] at 10am Expected Discharge Plan: [place] Barriers to Discharge: Continued Medical Work up   Patient Goals and CMS Choice Patient states their goals for this hospitalization and ongoing recovery are:: to go home CMS Medicare.gov Compare Post Acute Care list provided to:: Patient Choice offered to / list presented to : Patient  Expected Discharge Plan and Services Expected Discharge Plan: [place]   Discharge Planning Services: CM Consult Post Acute Care Choice: [place] arrangements for the past 2 months: Single Family Home                           HH Arranged: RN, PT, OT, Nurse's Aide, Social Work [employer] Agency: [facility] [employer] [facility] [employer] Date [place]: 04/25/20 Time [place]: [phone] Representative spoke with at [place]: [place] Arrangements/Services Living arrangements for the past 2 months: [place] with:: Spouse Patient language and need for interpreter reviewed:: No Do you feel safe going back to the place where you live?: Yes      Need for Family Participation in Patient Care: Yes (Comment)     Criminal Activity/Legal Involvement Pertinent to Current Situation/Hospitalization: No - Comment as needed  Activities of Daily Living Home Assistive Devices/Equipment: None ADL Screening (condition at time of admission) Patient's cognitive ability adequate to safely complete daily activities?: Yes Is the patient deaf or have difficulty hearing?: No Does the patient have difficulty  seeing, even when wearing glasses/contacts?: No Does the patient have difficulty concentrating, remembering, or making decisions?: No Patient able to express need for assistance with ADLs?: Yes Does the patient have difficulty dressing or bathing?: No Independently performs ADLs?: No Communication: Independent Dressing (OT): Independent Grooming: Independent Feeding: Independent Bathing: Needs assistance Is this a change from baseline?: Pre-admission baseline Toileting: Independent In/Out Bed: Independent Walks in Home: Independent Does the patient have difficulty walking or climbing stairs?: Yes Weakness of Legs: Both Weakness of Arms/Hands: Both  Permission Sought/Granted                  Emotional Assessment Appearance:: Appears stated age     Orientation: : Oriented to Place, Oriented to Self, Oriented to  Time, Oriented to Situation Alcohol / Substance Use: Not Applicable Psych Involvement: No (comment)  Admission diagnosis:  Small bowel obstruction ([place]) [K56.609] SBO (small bowel obstruction) ([place]) [K56.609] Metastatic colon cancer in female [facility]) [C18.9] Patient Active Problem List   Diagnosis Date Noted  . Abdominal pain 04/12/2020  . Nausea & vomiting 04/12/2020  . Acute hyponatremia 04/12/2020  . AKI (acute kidney injury) ([place]) 04/12/2020  . Leukocytosis 04/12/2020  . SBO (small bowel obstruction) ([place]) 04/11/2020  . Metastatic colon cancer to liver ([place]) 06/17/2019  . Goals of care, counseling/discussion 06/17/2019  . At risk for adverse drug event 01/13/2018  . Anemia, unspecified 01/11/2018  . Abdominal pain, lower   . Encounter for palliative care   .  Abdominal wall abscess at site of surgical wound   . Acute osteomyelitis of pelvic region ([place])   . Open wound of abdomen 11/14/2017  . Port-A-Cath in place 10/21/2017  . Local recurrence of colon cancer ([place]) 10/03/2017  . Secondary malignancy of inguinal lymph nodes ([place]) 07/29/2017  . Cancer of  right colon ([place]) 05/05/2017   PCP:  Patient, No Pcp Per Pharmacy:   [facility]mmie Ferrier [address], [place] [facility] [address] [place] [address] Phone: [phone] Fax: [phone]  [place], [place] [address] Phone: [phone] Fax: [phone]     Social Determinants of Health (SDOH) Interventions    Readmission Risk Interventions No flowsheet data found.

## 2020-04-25 NOTE — Progress Notes (Signed)
Patient ID: Yolanda Watson, female   DOB: 03/25/1951, 69 y.o.   MRN: [id number]    4 Days Post-Op  Subjective: Patient continues to feel ok.  Still with some pain in abdomen as expected.  No further flatus, but no nausea with NGT removed.  ROS: See above, otherwise other systems negative  Objective: Vital signs in last 24 hours: Temp:  [96.9 F (36.1 C)-98.4 F (36.9 C)] 98.4 F (36.9 C) (04/27 0813) Pulse Rate:  [78-100] 85 (04/27 0900) Resp:  [14-31] 26 (04/27 0900) BP: (121-147)/(64-90) 121/77 (04/27 0425) SpO2:  [94 %-100 %] 96 % (04/27 0900) Last BM Date: 04/13/20  Intake/Output from previous day: 04/26 0701 - 04/27 0700 In: 3025.5 [P.O.:20; I.V.:2165.5; IV Piggyback:830] Out: [id number] [Urine:3475] Intake/Output this shift: No intake/output data recorded.  PE: Abd: soft, appropriately tender, +BS, some distention as expected in lower abdomen.  VAC and adaptic in place over wound.  Lab Results:  Recent Labs    04/24/20 0517 04/25/20 0637  WBC 13.6* 16.6*  HGB 10.9* 10.5*  HCT 33.8* 32.8*  PLT 139* 106*   BMET Recent Labs    04/24/20 0517 04/25/20 0637  NA 134*  133* 134*  K 4.3  4.2 4.7  CL 104  104 103  CO2 23  23 24   GLUCOSE 141*  143* 119*  BUN 16  16 16   CREATININE 0.53  0.51 0.51  CALCIUM 7.7*  7.6* 7.8*   PT/INR No results for input(s): LABPROT, INR in the last 72 hours. CMP     Component Value Date/Time   NA 134 (L) 04/25/2020 0637   NA 141 01/15/2018 0000   NA 138 08/07/2017 0835   K 4.7 04/25/2020 0637   K 4.1 08/07/2017 0835   CL 103 04/25/2020 0637   CO2 24 04/25/2020 0637   CO2 25 08/07/2017 0835   GLUCOSE 119 (H) 04/25/2020 0637   GLUCOSE 95 08/07/2017 0835   BUN 16 04/25/2020 0637   BUN 4 01/15/2018 0000   BUN 9.3 08/07/2017 0835   CREATININE 0.51 04/25/2020 0637   CREATININE 0.80 03/16/2020 1002   CREATININE 0.57 02/05/2018 1144   CREATININE 0.7 08/07/2017 0835   CALCIUM 7.8 (L) 04/25/2020 0637   CALCIUM 9.4  08/07/2017 0835   PROT 5.4 (L) 04/24/2020 0517   PROT 7.3 08/07/2017 0835   ALBUMIN 2.1 (L) 04/25/2020 0637   ALBUMIN 3.4 (L) 08/07/2017 0835   AST 14 (L) 04/24/2020 0517   AST 13 (L) 03/16/2020 1002   AST 25 08/07/2017 0835   ALT 11 04/24/2020 0517   ALT 11 03/16/2020 1002   ALT 21 08/07/2017 0835   ALKPHOS 102 04/24/2020 0517   ALKPHOS 138 08/07/2017 0835   BILITOT 0.4 04/24/2020 0517   BILITOT 0.4 03/16/2020 1002   BILITOT 0.49 08/07/2017 0835   GFRNONAA >60 04/25/2020 0637   GFRNONAA >60 03/16/2020 1002   GFRAA >60 04/25/2020 0637   GFRAA >60 03/16/2020 1002   Lipase     Component Value Date/Time   LIPASE 59 (H) 04/11/2020 1542       Studies/Results: No results found.  Anti-infectives: Anti-infectives (From admission, onward)   Start     Dose/Rate Route Frequency Ordered Stop   04/20/20 1000  ciprofloxacin (CIPRO) IVPB 400 mg     400 mg 200 mL/hr over 60 Minutes Intravenous Every 12 hours 04/20/20 0856     04/20/20 1000  metroNIDAZOLE (FLAGYL) IVPB 500 mg     500 mg 100 mL/hr over 60  Minutes Intravenous Every 8 hours 04/20/20 0856     04/19/20 0600  ciprofloxacin (CIPRO) IVPB 400 mg     400 mg 200 mL/hr over 60 Minutes Intravenous On call to O.R. 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 Benay Spice -Excision of malignant mass from the abdominal including skin, muscle, fascia and peritoneum(15 x 12cm), abdominal reconstruction with [name], 10/03/17 Dr. Barry Dienes and [name]   POD6/4, s/p ex lap with SBR for contained perforation fromSBO and takeback for abdominal wall closure, Dr. Marlou Starks -VAC change on MWF -cont abx.  WBC back up from 13 to 17.  Drainage noted from wound yesterday during VAC change was serous. -NGT out.  Sips of  clears from the floor today per patient request -also requests a suppository -mobilize, PT/OT -cont TNA  FEN -NPO/NGT(clamp)/IVFs/TNA VTE -Lovenox, may transition back to xarelto from our standpoint ID -cipro/Flagyl 4/22 -->   LOS: 14 days    Henreitta Cea , [facility] 04/25/2020, 9:11 AM Please see Amion for pager number during day hours 7:00am-4:30pm or 7:00am -11:30am on weekends

## 2020-04-26 DIAGNOSIS — R1084 Generalized abdominal pain: Secondary | ICD-10-CM | POA: Diagnosis not present

## 2020-04-26 DIAGNOSIS — E871 Hypo-osmolality and hyponatremia: Secondary | ICD-10-CM | POA: Diagnosis not present

## 2020-04-26 DIAGNOSIS — K56609 Unspecified intestinal obstruction, unspecified as to partial versus complete obstruction: Secondary | ICD-10-CM | POA: Diagnosis not present

## 2020-04-26 LAB — GLUCOSE, CAPILLARY
Glucose-Capillary: 107 mg/dL — ABNORMAL HIGH (ref 70–99)
Glucose-Capillary: 127 mg/dL — ABNORMAL HIGH (ref 70–99)
Glucose-Capillary: 138 mg/dL — ABNORMAL HIGH (ref 70–99)
Glucose-Capillary: 138 mg/dL — ABNORMAL HIGH (ref 70–99)

## 2020-04-26 LAB — CBC
HCT: 31.7 % — ABNORMAL LOW (ref 36.0–46.0)
Hemoglobin: 10.2 g/dL — ABNORMAL LOW (ref 12.0–15.0)
MCH: 33.3 pg (ref 26.0–34.0)
MCHC: 32.2 g/dL (ref 30.0–36.0)
MCV: 103.6 fL — ABNORMAL HIGH (ref 80.0–100.0)
Platelets: 178 10*3/uL (ref 150–400)
RBC: 3.06 MIL/uL — ABNORMAL LOW (ref 3.87–5.11)
RDW: 13.4 % (ref 11.5–15.5)
WBC: 15.9 10*3/uL — ABNORMAL HIGH (ref 4.0–10.5)
nRBC: 0 % (ref 0.0–0.2)

## 2020-04-26 LAB — RENAL FUNCTION PANEL
Albumin: 2.3 g/dL — ABNORMAL LOW (ref 3.5–5.0)
Anion gap: 6 (ref 5–15)
BUN: 20 mg/dL (ref 8–23)
CO2: 24 mmol/L (ref 22–32)
Calcium: 7.9 mg/dL — ABNORMAL LOW (ref 8.9–10.3)
Chloride: 103 mmol/L (ref 98–111)
Creatinine, Ser: 0.54 mg/dL (ref 0.44–1.00)
GFR calc Af Amer: >60 mL/min (ref >60)
GFR calc non Af Amer: >60 mL/min (ref >60)
Glucose, Bld: 139 mg/dL — ABNORMAL HIGH (ref 70–99)
Phosphorus: 3.7 mg/dL (ref 2.5–4.6)
Potassium: 4.6 mmol/L (ref 3.5–5.1)
Sodium: 133 mmol/L — ABNORMAL LOW (ref 135–145)

## 2020-04-26 LAB — MAGNESIUM: Magnesium: 1.8 mg/dL (ref 1.7–2.4)

## 2020-04-26 MED ORDER — INSULIN ASPART 100 UNIT/ML ~~LOC~~ SOLN
0.0000 [IU] | Freq: Three times a day (TID) | SUBCUTANEOUS | Status: DC
  Administered 2020-04-26: 1 [IU] via SUBCUTANEOUS

## 2020-04-26 MED ORDER — MAGNESIUM SULFATE 2 GM/50ML IV SOLN
2.0000 g | Freq: Once | INTRAVENOUS | Status: AC
  Administered 2020-04-26: 2 g via INTRAVENOUS
  Filled 2020-04-26: qty 50

## 2020-04-26 MED ORDER — TRAVASOL 10 % IV SOLN
INTRAVENOUS | Status: DC
  Filled 2020-04-26: qty 1152

## 2020-04-26 MED ORDER — ACETAMINOPHEN 500 MG PO TABS
500.0000 mg | ORAL_TABLET | Freq: Four times a day (QID) | ORAL | Status: DC | PRN
  Administered 2020-04-26 – 2020-04-27 (×2): 500 mg via ORAL
  Filled 2020-04-26 (×2): qty 1

## 2020-04-26 NOTE — Progress Notes (Signed)
Pt refusing lab draw. IV team states [facility] has no blood return. Port has TPN infusing. Unable to obtain lab work at this time.

## 2020-04-26 NOTE — Progress Notes (Signed)
Nutrition (TNA) infusing via Port; [facility] no longer giving blood return. Patient refuses to have lab stick for blood work. RN to [employer].D.

## 2020-04-26 NOTE — Progress Notes (Signed)
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  Patient was admitted to the hospital for further evaluation and treatment.  Patient failed to improve with medical management, she underwent oratory laparotomy, repair of multiple serosal tears, small bowel resection, lysis of adhesion, application of a wound VAC on 4/24.  Patient subsequently had exploratory laparotomy, closure of the abdomen with ACell gentry X thick 20 x 30 cm, application of wound VAC on 4/24.   OT comments  Pt limited by pain/fatigue this session, initially stating "I am not working with you" but finally agreeable OOB to [facility]. MOd A +2 for bed mobility and SPT to [facility]. Max A +2 for peri care in standing, and Pt getting so anxious during transfer back to bed that she cursed at therapists  (apologized afterwards). Pt determined to return home, but has 2 flights of steps to get up, OT will continue to follow acutely and focus on functional independence with ADL and transfers as well as educating on environmental adaptations and compensatory strategies for home environment.    Follow Up Recommendations  SNF(Pt continued to decline SNF - will need max HH services)    Equipment Recommendations  3 in 1 bedside commode    Recommendations for Other Services      Precautions / Restrictions Precautions Precautions: Fall Restrictions Weight Bearing Restrictions: No       Mobility Bed Mobility Overal bed mobility: Needs Assistance Bed Mobility: Supine to Sit;Sit to Supine     Supine to sit: Min guard Sit to supine: Min assist   General bed mobility comments: cues for use of bed rail  but pt deciding not to use. pt relying on UE's to press up to raise trunk in long sitting and pivot LE's off EOB. Extra time required, pt requesting to do it her way and in her own time. Min assist to raise LE's into bed to aid in pain releif while returning to supine.  Transfers Overall transfer level: Needs assistance Equipment used: 2 person hand held assist;1 person hand held assist Transfers: Sit to/from [facility] Sit to Stand: +2 physical assistance;+2 safety/equipment;Mod assist Stand pivot transfers: Mod assist;+2 physical assistance;+2 safety/equipment       General transfer comment: pt required 2+ HHA for sit to stand and pivot transfer to [facility] from bed. Pt unsteady and with flexed posture in standing. Mod assist required to prevent LOB and guide UE's back to arm rest of BSC. Pt fearful of falling and holding/hugging therapist for stand pivot from [facility] to EOB. pt reporting pain.    Balance Overall balance assessment: Needs assistance Sitting-balance support: Feet supported;Bilateral upper extremity supported Sitting balance-Leahy Scale: Fair     Standing balance support: Bilateral upper extremity supported;During functional activity Standing balance-Leahy Scale: Poor                             ADL either performed or assessed with clinical judgement   ADL Overall ADL's : Needs assistance/impaired     Grooming: Set up;Wash/dry face;Sitting                   Toilet Transfer: Moderate assistance;+2 for physical  assistance;+2 for safety/equipment;Stand-pivot;BSC Toilet Transfer Details (indicate cue type and reason): face to face transfer [place] and Hygiene: Maximal assistance;+2 for physical assistance;+2 for safety/equipment;Sit to/from stand Toileting - Clothing Manipulation Details (indicate cue type and reason): one therapist in front to facilitate standing, one to perform peri care     Functional mobility  during ADLs: +2 for physical assistance       Vision       Perception     Praxis      Cognition Arousal/Alertness: Awake/alert Behavior During Therapy: Agitated;Anxious Overall Cognitive Status: No family/caregiver present to determine baseline cognitive functioning                                 General Comments: pt has poor awareness of deficits and does not fully understand her current impairments/limitations. pt required encouragement to participate in therapy services initially stating "I'm not doing anything today" and eventually agreeable to get to [facility].        Exercises     Shoulder Instructions       General Comments      Pertinent Vitals/ Pain       Pain Assessment: 0-10 Pain Score: 10-Worst pain ever Pain Location: Abdomen Pain Descriptors / Indicators: Grimacing;Aching;Guarding;Moaning;Crying Pain Intervention(s): Limited activity within patient's tolerance;Monitored during session;Repositioned;RN gave pain meds during session  [place] expects to be discharged to:: Private residence Living Arrangements: Spouse/significant other Available Help at Discharge: Family Type of Home: Apartment Home Access: Stairs to enter Technical brewer of Steps: 2 flights (~20 steps) Entrance Stairs-Rails: Right;Left Home Layout: One level     Bathroom Shower/Tub: Teacher, early years/pre: Standard Bathroom Accessibility: Yes   Home Equipment: Environmental consultant - 2 wheels          Prior Functioning/Environment Level of Independence: Independent            Frequency  Min 2X/week        Progress Toward Goals  OT Goals(current goals can now be found in the care plan section)  Progress towards OT goals: Progressing toward goals  Acute Rehab OT Goals Patient Stated Goal: Patient wants to go home at discharge. Time For Goal Achievement: 05/08/20 Potential to Achieve Goals: Good  Plan Discharge plan remains  appropriate;Frequency remains appropriate    Co-evaluation    PT/OT/SLP Co-Evaluation/Treatment: Yes Reason for Co-Treatment: For patient/therapist safety;To address functional/ADL transfers;Other (comment)(activity tolerance) PT goals addressed during session: Balance;Mobility/safety with mobility OT goals addressed during session: ADL's and self-care      AM-PAC OT "6 Clicks" Daily Activity     Outcome Measure   Help from another person eating meals?: A Little Help from another person taking care of personal grooming?: A Lot Help from another person toileting, which includes using toliet, bedpan, or urinal?: Total Help from another person bathing (including washing, rinsing, drying)?: A Lot Help from another person to put on and taking off regular upper body clothing?: A Lot Help from another person to put on and taking off regular lower body clothing?: Total 6 Click Score: 11    End of Session Equipment Utilized During Treatment: Oxygen  OT Visit Diagnosis: Unsteadiness on feet (R26.81);Muscle weakness (generalized) (M62.81);Pain Pain - part of body: (abdomen)   Activity Tolerance Patient limited by pain;Patient limited by fatigue   Patient Left in bed;with call bell/phone within reach;with bed alarm set   Nurse Communication Mobility status;Patient requests pain meds  Time: [id number] OT Time Calculation (min): 24 min  Charges: OT General Charges $OT Visit: 1 Visit OT Treatments $Self Care/Home Management : 8-22 mins  Jesse Sans OTR/L Acute Rehabilitation Services Pager: [phone] Office: [place] 04/26/2020, 1:38 PM

## 2020-04-26 NOTE — Evaluation (Signed)
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  Patient was admitted to the hospital for further evaluation and treatment.  Patient failed to improve with medical management, she underwent oratory laparotomy, repair of multiple serosal tears, small bowel resection, lysis of adhesion, application of a wound VAC on 4/24.  Patient subsequently had exploratory laparotomy, closure of the abdomen with ACell gentry X thick 20 x 30 cm, application of wound VAC on 4/24.    Clinical Impression  Yolanda Watson is 69 y.o. female admitted with above HPI and diagnosis. Patient is currently limited by functional impairments below (see PT problem list). Patient lives with her husband and is independent at baseline. Patient required 2+ assist today for stand pivot transfer and is greatly limited by pain. Patient has poor understanding of deficits and functional mobility impairments. Patient will benefit from continued skilled PT interventions to address impairments and progress independence with mobility, recommending SNF with 24/7 assist. Acute PT will follow and progress as able.     Follow Up Recommendations SNF;Supervision/Assistance - 24 hour    Equipment Recommendations  None recommended by PT    Recommendations for Other Services       Precautions / Restrictions Precautions Precautions: Fall Restrictions Weight Bearing Restrictions: No      Mobility  Bed Mobility Overal bed mobility: Needs Assistance Bed Mobility: Supine to Sit;Sit to Supine     Supine to sit: Min guard Sit to supine: Min assist   General bed mobility comments: cues for use of bed rail  but pt deciding not to use. pt relying on UE's to press up to raise trunk in long sitting and pivot LE's off EOB. Extra time required, pt requesting to do it her way and in her own time. Min assist to raise LE's into bed to aid in pain releif while returning to supine.  Transfers Overall transfer level: Needs assistance Equipment used: 2 person hand held assist;1 person hand held assist Transfers: Sit to/from [facility] Sit to Stand: +2 physical assistance;+2 safety/equipment;Mod assist Stand pivot transfers: Mod assist;+2 physical assistance;+2 safety/equipment       General transfer comment: pt required 2+ HHA for sit to stand and pivot transfer to [facility] from bed. Pt unsteady and with flexed posture in standing. Mod assist required to prevent LOB and guide UE's back to arm rest of BSC. Pt fearful of falling and holding/hugging therapist for stand pivot from [facility] to EOB. pt reporting pain.  Ambulation/Gait         Stairs     Wheelchair Mobility    Modified Rankin (Stroke Patients Only)       Balance Overall balance assessment: Needs assistance Sitting-balance support: Feet supported;Bilateral upper extremity supported Sitting balance-Leahy Scale: Fair     Standing balance support: Bilateral upper extremity supported;During functional activity Standing balance-Leahy Scale: Poor          Pertinent Vitals/Pain Pain Assessment: 0-10 Pain Score: 10-Worst pain ever Pain Location: Abdomen Pain Descriptors / Indicators: Grimacing;Aching;Guarding;Moaning;Crying Pain Intervention(s): Limited activity within patient's tolerance;Monitored during session;RN gave pain meds during session    [place] expects to be discharged to:: Private residence Living Arrangements: Spouse/significant other Available Help at Discharge: Family  Type of Home: Apartment Home Access: Stairs to enter Entrance Stairs-Rails: Right;Left Entrance Stairs-Number of Steps:  2 flights (~20 steps) Home Layout: One level Home Equipment: Walker - 2 wheels      Prior Function Level of Independence: Independent           Hand Dominance        Extremity/Trunk Assessment   Upper Extremity Assessment Upper Extremity Assessment: Defer to OT evaluation    Lower Extremity Assessment Lower Extremity Assessment: Generalized weakness    Cervical / Trunk Assessment Cervical / Trunk Assessment: Normal  Communication   Communication: No difficulties  Cognition Arousal/Alertness: Awake/alert Behavior During Therapy: Agitated;Anxious Overall Cognitive Status: No family/caregiver present to determine baseline cognitive functioning        General Comments: pt has poor awareness of deficits and does not fully understand her current impairments/limitations. pt required encouragement to participate in therapy services initially stating "I'm not doing anything today" and eventually agreeable to get to [facility].      General Comments      Exercises     Assessment/Plan    PT Assessment Patient needs continued PT services  PT Problem List Decreased strength;Decreased activity tolerance;Decreased balance;Decreased mobility;Decreased knowledge of use of DME;Decreased safety awareness;Decreased knowledge of precautions;Pain       PT Treatment Interventions DME instruction;Gait training;Stair training;Functional mobility training;Therapeutic activities;Therapeutic exercise;Balance training;Patient/family education    PT Goals (Current goals can be found in the Care Plan section)  Acute Rehab PT Goals Patient Stated Goal: Patient wants to go home at discharge. PT Goal Formulation: With patient Time For Goal Achievement: 05/10/20 Potential to Achieve Goals: Fair    Frequency Min 3X/week   Barriers to discharge Inaccessible home environment 2 flights of stairs to enter home    Co-evaluation PT/OT/SLP Co-Evaluation/Treatment: Yes Reason for Co-Treatment: To  address functional/ADL transfers;For patient/therapist safety PT goals addressed during session: Mobility/safety with mobility;Balance OT goals addressed during session: ADL's and self-care       AM-PAC PT "6 Clicks" Mobility  Outcome Measure Help needed turning from your back to your side while in a flat bed without using bedrails?: A Little Help needed moving from lying on your back to sitting on the side of a flat bed without using bedrails?: A Little Help needed moving to and from a bed to a chair (including a wheelchair)?: A Lot Help needed standing up from a chair using your arms (e.g., wheelchair or bedside chair)?: A Lot Help needed to walk in hospital room?: Total Help needed climbing 3-5 steps with a railing? : Total 6 Click Score: 12    End of Session Equipment Utilized During Treatment: Gait belt Activity Tolerance: Patient limited by pain Patient left: in bed;with call bell/phone within reach;with bed alarm set Nurse Communication: Mobility status;Patient requests pain meds PT Visit Diagnosis: Unsteadiness on feet (R26.81);Muscle weakness (generalized) (M62.81);Difficulty in walking, not elsewhere classified (R26.2);Pain Pain - part of body: (abdomen)    Time: [id number] PT Time Calculation (min) (ACUTE ONLY): 23 min   Charges:   PT Evaluation $PT Eval Moderate Complexity: 1 Mod          Gwynneth Albright PT, DPT Physical Therapist with [facility] [phone]  04/26/2020 1:03 PM

## 2020-04-26 NOTE — Progress Notes (Signed)
Patient ID: Yolanda Watson, female   DOB: [birth year], 69 y.o.   MRN: [id number]    5 Days Post-Op  Subjective: Feeling well today but tired.  Had a couple of bowel movements and stomach feels much less bloated today.  No nausea.  ROS: See above, otherwise other systems negative  Objective: Vital signs in last 24 hours: Temp:  [97.6 F (36.4 C)-98.8 F (37.1 C)] 97.9 F (36.6 C) (04/28 0524) Pulse Rate:  [85-97] 85 (04/28 0524) Resp:  [16-19] 18 (04/28 0524) BP: (117-129)/(63-75) 124/71 (04/28 0524) SpO2:  [95 %-100 %] 98 % (04/28 0524) Last BM Date: 04/26/20  Intake/Output from previous day: 04/27 0701 - 04/28 0700 In: 2165.4 [I.V.:1463.7; IV Piggyback:701.7] Out: [id number] [Urine:3575] Intake/Output this shift: Total I/O In: 368.6 [I.V.:341.3; IV Piggyback:27.3] Out: 1800 [Urine:1800]  PE: Abd: soft, appropriately tender, VAC in place and will be changed today per WOC, discussed VAC change with her, +BS, much less bloated today.  A cell visible and appears normal with no apparent infection obvious.  Lab Results:  Recent Labs    04/25/20 0637 04/26/20 0351  WBC 16.6* 15.9*  HGB 10.5* 10.2*  HCT 32.8* 31.7*  PLT 106* 178   BMET Recent Labs    04/25/20 0637 04/26/20 0351  NA 134* 133*  K 4.7 4.6  CL 103 103  CO2 24 24  GLUCOSE 119* 139*  BUN 16 20  CREATININE 0.51 0.54  CALCIUM 7.8* 7.9*   PT/INR No results for input(s): LABPROT, INR in the last 72 hours. CMP     Component Value Date/Time   NA 133 (L) 04/26/2020 0351   NA 141 01/15/2018 0000   NA 138 08/07/2017 0835   K 4.6 04/26/2020 0351   K 4.1 08/07/2017 0835   CL 103 04/26/2020 0351   CO2 24 04/26/2020 0351   CO2 25 08/07/2017 0835   GLUCOSE 139 (H) 04/26/2020 0351   GLUCOSE 95 08/07/2017 0835   BUN 20 04/26/2020 0351   BUN 4 01/15/2018 0000   BUN 9.3 08/07/2017 0835   CREATININE 0.54 04/26/2020 0351   CREATININE 0.80 03/16/2020 1002   CREATININE 0.57 02/05/2018 1144   CREATININE 0.7  08/07/2017 0835   CALCIUM 7.9 (L) 04/26/2020 0351   CALCIUM 9.4 08/07/2017 0835   PROT 5.4 (L) 04/24/2020 0517   PROT 7.3 08/07/2017 0835   ALBUMIN 2.3 (L) 04/26/2020 0351   ALBUMIN 3.4 (L) 08/07/2017 0835   AST 14 (L) 04/24/2020 0517   AST 13 (L) 03/16/2020 1002   AST 25 08/07/2017 0835   ALT 11 04/24/2020 0517   ALT 11 03/16/2020 1002   ALT 21 08/07/2017 0835   ALKPHOS 102 04/24/2020 0517   ALKPHOS 138 08/07/2017 0835   BILITOT 0.4 04/24/2020 0517   BILITOT 0.4 03/16/2020 1002   BILITOT 0.49 08/07/2017 0835   GFRNONAA >60 04/26/2020 0351   GFRNONAA >60 03/16/2020 1002   GFRAA >60 04/26/2020 0351   GFRAA >60 03/16/2020 1002   Lipase     Component Value Date/Time   LIPASE 59 (H) 04/11/2020 1542       Studies/Results: No results found.  Anti-infectives: Anti-infectives (From admission, onward)   Start     Dose/Rate Route Frequency Ordered Stop   04/20/20 1000  ciprofloxacin (CIPRO) IVPB 400 mg     400 mg 200 mL/hr over 60 Minutes Intravenous Every 12 hours 04/20/20 0856     04/20/20 1000  metroNIDAZOLE (FLAGYL) IVPB 500 mg     500 mg 100  mL/hr over 60 Minutes Intravenous Every 8 hours 04/20/20 0856     04/19/20 0600  ciprofloxacin (CIPRO) IVPB 400 mg     400 mg 200 mL/hr over 60 Minutes Intravenous On call to O.R. 04/18/20 1449 04/19/20 1200       Assessment/Plan Chronic tobacco use Abdominal wall thrombus-on Xarelto Hx EtOH abuse AKI Malnutrition - prealbumin7.9, on TNA  Metastatic colon cancerwith right hemicolectomy 2016 - ongoing chemotherapy, followed by Dr. Benay Spice -Excision of malignant mass from the abdominal including skin, muscle, fascia and peritoneum(15 x 12cm), abdominal reconstruction with [name], 10/03/17 Dr. Barry Dienes and [name]   POD7/5, s/p ex lap with SBR for contained perforation fromSBOand takeback for abdominal wall closure, Dr. Marlou Starks -VAC change on MWF -cont abx.  WBC down to 15K today.  No obvious infection noted.   Today is day 7 of abx therapy.  Not sure she needs much more but given she has A cell present, would like to probably continue to cover her abdomen until her WBC continues to normalize -CLD -mobilize, PT/OT -cont TNA  FEN -TNA/CLD VTE -xarelto ID -cipro/Flagyl 4/22 -->   LOS: 15 days    Henreitta Cea , [facility] 04/26/2020, 10:20 AM Please see Amion for pager number during day hours 7:00am-4:30pm or 7:00am -11:30am on weekends

## 2020-04-26 NOTE — Consult Note (Signed)
[place] Nurse Consult Note: Reason for Consult:Midline abdominal wound, VAC therapy.  ACELL in place in distal segment Wound type: surgical Pressure Injury POA:NA Measurement: 23 cm x 4 cm x 1 cm with visible ACELL, unable to visualize distal segment.  Wound [id number] pink nongranulating  Drainage (amount, consistency, odor) moderate serosanguinous  No odor.  Periwound:intact abdomen Dressing procedure/placement/frequency: CLeanse abdominal wound with NS.  Apply nonadherent contact layer to ACELL (distal segment)   Black foam to proximal end wound bed and over contact layer. Cover with drape. Seal immediately achieved.  Change Mon/Wed/Fri WOC team will follow.  Domenic Moras MSN, RN, FNP-BC CWON Wound, Ostomy, Continence Nurse Pager [phone]

## 2020-04-26 NOTE — Progress Notes (Signed)
PHARMACY - TOTAL PARENTERAL NUTRITION CONSULT NOTE   Indication: small bowel obstruction, perforation  Patient Measurements: Height: 5\' 6"  (167.6 cm) Weight: 55.2 kg (121 lb 11.1 oz) IBW/kg (Calculated) : 59.3 TPN AdjBW (KG): 55.2 Body mass index is 19.64 kg/m.  Assessment: 23 y/oF with PMH of metastatic colon cancer, abdominal wall thrombus who presented on 04/11/2020 secondary to abdominal pain and found to have SBO, perforation. Pharmacy consulted for TPN.   Glucose / Insulin: No hx of DM, CBG at goal <150 (range 112-150).  Sensitive SSI q6h - 3 units administered in past 24h. Electrolytes:  Na (133) low, Mg (1.8) on lower end of normal. All other lytes WNL including CorrCa 9.3 Renal: SCr/BUN WNL, stable LFTs / TGs: LFTs WNL (4/26)/Triglycerides 78(4/26) Prealbumin: 14.7 >> 5  >> 7.9 Intake / Output; MIVF: mIVF d/c'd GI Imaging:  -4/13 CT abd/pelvis: SBO with transition point in the mid abdomen -4/19 AXR: Mild improvement in the pattern of partial small bowel obstruction with interval evidence of mucosal thickening involving the dilated loops of jejunum without pneumatosis.  Surgeries / Procedures:  -4/21: Ex lap with small bowel resection & wound vac -4/23: Ex lap with abdominal closure & washout & wound vac  Central access: port  TPN start date: 04/17/2020  Nutritional Goals: per RD recommendation on 4/26: Kcal: 1895-2130 kcal, Protein: 105-120 grams, Fluid:  >/= 2 L/day  Custom TPN at goal rate of 80 mL/hr will provide 115 g protein, 2016 kcal; meeting 100% of patient needs.   Current Nutrition:  -TPN -Diet: CLD, no meal intake charted  Plan:  At 1800:  Continue TPN at 80 mL/hr  Electrolytes in TPN: Increase Na, Decrease K  90 mEq/L of Na, 15 mEq/L of K, 61mEq/L of Ca, 9 mEq/L of Mag, 15 mmol/L of Phos.   1:1 ratio Cl:Ac  Standard MVI and trace elements to TPN   Continue Sensitive SSI, change to q8h and adjust as needed  Monitor TPN labs on Mon/Thurs.    Monitor PO intake for transition off TPN when meeting more nutritional goals.  Lenis Noon, PharmD 04/26/20 11:07 AM

## 2020-04-26 NOTE — Care Management Important Message (Signed)
Important Message  Patient Details IM Letter given to [place] Case Manager to present to the Patient Name: Yolanda Watson MRN: [id number] Date of Birth: 1951-04-10   Medicare Important Message Given:  Yes     Kerin Salen 04/26/2020, 11:46 AM

## 2020-04-26 NOTE — Progress Notes (Signed)
PROGRESS NOTE    Yolanda Watson    Code Status: DNR  [id number] DOB: Nov 25, 1951 DOA: 04/11/2020 LOS: 15 days  PCP: Patient, No Pcp Per CC:  Chief Complaint  Patient presents with  . Abdominal Pain  . Constipation  . 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  General surgery was subsequently consulted and patient underwent exploratory laparotomy with small bowel resection with Dr. Marlou Starks (4/21) and closure of abdomen with application of wound VAC on 4/23    A & P   Principal Problem:   SBO (small bowel obstruction) ([place]) Active Problems:   Abdominal pain   Nausea & vomiting   Acute hyponatremia   AKI (acute kidney injury) ([place])   Leukocytosis   1. SBO with perforation s/p exploratory laparotomy with small bowel resection with Dr. Marlou Starks (4/21) and closure of abdomen with application of wound VAC on 4/23 a. NGT out b. WBC improved c. Poor pain control on Dilaudid every 2 hours as needed -added on Tylenol d. Date 8/TBD Cipro/Flagyl e. Still on TPN with diet advanced to CLD f. Appreciate general surgery recommendations  2. Coffee-ground fluid from NG tube, appears resolved a. Continue Protonix  3. Acute hypoxic respiratory failure, likely from postop atelectasis and opiates, resolved a. Tolerating room air  4. AKI solved with IV fluids  5. Metabolic alkalosis secondary to GI losses, resolved  6. Hyperkalemia resolved  7. Metastatic colon cancer follows with Dr. Benay Spice outpatient  8. Asymptomatic pancreatic duct dilation a. Noted on CT scan b. Outpatient follow-up  9. Known IVC infrarenal thrombus a. Back on home Xarelto  10. Hyponatremia a. Encourage p.o. intake  DVT prophylaxis: Xarelto Family  Communication: Patient updated at bedside Disposition Plan:  Status is: Inpatient  Remains inpatient appropriate because:IV treatments appropriate due to intensity of illness or inability to take PO   Dispo: The patient is from: Home              Anticipated d/c is to: SNF              Anticipated d/c date is: > 3 days              Patient currently is not medically stable to d/c.           Pressure injury documentation    None  Consultants  General surgery   Procedures  s/p exploratory laparotomy with small bowel resection with Dr. Marlou Starks (4/21) and closure of abdomen with application of wound VAC on 4/23  Antibiotics   Anti-infectives (From admission, onward)   Start     Dose/Rate Route Frequency Ordered Stop   04/20/20 1000  ciprofloxacin (CIPRO) IVPB 400 mg     400 mg 200 mL/hr over 60 Minutes Intravenous Every 12 hours 04/20/20 0856     04/20/20 1000  metroNIDAZOLE (FLAGYL) IVPB 500 mg     500 mg 100 mL/hr over 60 Minutes Intravenous Every 8 hours 04/20/20 0856     04/19/20 0600  ciprofloxacin (CIPRO) IVPB 400 mg     400 mg 200 mL/hr over 60 Minutes Intravenous On call to O.R. 04/18/20 1449 04/19/20 1200        Subjective   Reports poorly controlled pain and  states that Dilaudid only lasts about 1 hour.  Has not had anything by mouth other than her Xarelto.  Denies any fevers or any other complaints at this time.  Objective   Vitals:   04/25/20 1709 04/25/20 2012 04/26/20 0524 04/26/20 1342  BP: 129/63 120/75 124/71 104/70  Pulse: 87 97 85 87  Resp:  16 18 16   Temp: 98.1 F (36.7 C) 98.8 F (37.1 C) 97.9 F (36.6 C) 97.8 F (36.6 C)  TempSrc: Oral Oral Oral Oral  SpO2: 96% 100% 98% 97%  Weight:      Height:        Intake/Output Summary (Last 24 hours) at 04/26/2020 1441 Last data filed at 04/26/2020 1342 Gross per 24 hour  Intake 1866.98 ml  Output 4500 ml  Net -2633.02 ml   Filed Weights   04/21/20 1256 04/23/20 0500 04/24/20 0500    Weight: 53.6 kg 53.3 kg 55.2 kg    Examination:  Physical Exam Vitals and nursing note reviewed.  Constitutional:      Appearance: Normal appearance.  HENT:     Head: Normocephalic and atraumatic.  Eyes:     Conjunctiva/sclera: Conjunctivae normal.  Cardiovascular:     Rate and Rhythm: Normal rate and regular rhythm.  Pulmonary:     Effort: Pulmonary effort is normal.     Breath sounds: Normal breath sounds.  Abdominal:     Comments: Postsurgical abdomen with wound vac  Musculoskeletal:        General: No swelling or tenderness.  Skin:    Coloration: Skin is not jaundiced or pale.  Neurological:     Mental Status: She is alert. Mental status is at baseline.  Psychiatric:        Mood and Affect: Mood normal.        Behavior: Behavior normal.     Data Reviewed: I have personally reviewed following labs and imaging studies  CBC: Recent Labs  Lab 04/22/20 0702 04/23/20 0738 04/24/20 0517 04/25/20 0637 04/26/20 0351  WBC 23.3* 17.5* 13.6* 16.6* 15.9*  NEUTROABS  --   --  10.7*  --   --   HGB 12.0 11.7* 10.9* 10.5* 10.2*  HCT 38.3 37.0 33.8* 32.8* 31.7*  MCV 106.7* 106.0* 103.4* 104.5* 103.6*  PLT 143* 156 139* 106* 0000000   Basic Metabolic Panel: Recent Labs  Lab 04/22/20 0702 04/23/20 0317 04/23/20 0644 04/24/20 0517 04/25/20 0637 04/26/20 0351  NA 136  --  136 134*  133* 134* 133*  K 5.2*  --  4.8 4.3  4.2 4.7 4.6  CL 105  --  104 104  104 103 103  CO2 25  --  26 23  23 24 24   GLUCOSE 172*  --  126* 141*  143* 119* 139*  BUN 18  --  17 16  16 16 20   CREATININE 0.60  --  0.48 0.53  0.51 0.51 0.54  CALCIUM 7.6*  --  7.8* 7.7*  7.6* 7.8* 7.9*  MG 1.8 1.8  --  1.5* 1.9 1.8  PHOS 3.6  3.5  --  3.3 3.8  3.8 4.0 3.7   GFR: Estimated Creatinine Clearance: 59.5 mL/min (by C-G formula based on SCr of 0.54 mg/dL). Liver Function Tests: Recent Labs  Lab 04/20/20 0507 04/21/20 0448 04/22/20 [id number] 04/23/20 [id number] 04/24/20 0517 04/25/20 0637  04/26/20 0351  AST 10*  --   --   --  14*  --   --   ALT 9  --   --   --  11  --   --   ALKPHOS 56  --   --   --  102  --   --   BILITOT 0.3  --   --   --  0.4  --   --   PROT 4.8*  --   --   --  5.4*  --   --   ALBUMIN 2.2*  2.2*   < > 2.4* 2.3* 2.0*  2.0* 2.1* 2.3*   < > = values in this interval not displayed.   No results for input(s): LIPASE, AMYLASE in the last 168 hours. No results for input(s): AMMONIA in the last 168 hours. Coagulation Profile: No results for input(s): INR, PROTIME in the last 168 hours. Cardiac Enzymes: No results for input(s): CKTOTAL, CKMB, CKMBINDEX, TROPONINI in the last 168 hours. BNP (last 3 results) No results for input(s): PROBNP in the last 8760 hours. HbA1C: No results for input(s): HGBA1C in the last 72 hours. CBG: Recent Labs  Lab 04/25/20 1138 04/25/20 1755 04/25/20 2357 04/26/20 0605 04/26/20 1142  GLUCAP 126* 112* 150* 138* 127*   Lipid Profile: Recent Labs    04/24/20 0517  TRIG 78   Thyroid Function Tests: No results for input(s): TSH, T4TOTAL, FREET4, T3FREE, THYROIDAB in the last 72 hours. Anemia Panel: No results for input(s): VITAMINB12, FOLATE, FERRITIN, TIBC, IRON, RETICCTPCT in the last 72 hours. Sepsis Labs: No results for input(s): PROCALCITON, LATICACIDVEN in the last 168 hours.  Recent Results (from the past 240 hour(s))  Surgical pcr screen     Status: None   Collection Time: 04/19/20  2:13 AM   Specimen: Nasal Mucosa; Nasal Swab  Result Value Ref Range Status   MRSA, PCR NEGATIVE NEGATIVE Final   Staphylococcus aureus NEGATIVE NEGATIVE Final    Comment: (NOTE) The Xpert SA Assay (FDA approved for NASAL specimens in patients 56 years of age and older), is one component of a comprehensive surveillance program. It is not intended to diagnose infection nor to guide or monitor treatment. Performed at [facility], [place] [address] [place], [place] [address]          Radiology  Studies: No results found.      Scheduled Meds: . bisacodyl  10 mg Rectal Once  . Chlorhexidine Gluconate Cloth  6 each Topical Daily  . insulin aspart  0-9 Units Subcutaneous Q8H  . mouth rinse  15 mL Mouth Rinse BID  . pantoprazole (PROTONIX) IV  40 mg Intravenous Q12H  . rivaroxaban  20 mg Oral Daily  . sodium chloride flush  10-40 mL Intracatheter Q12H  . sodium chloride flush  3 mL Intravenous Q12H   Continuous Infusions: . ciprofloxacin 400 mg (04/26/20 1120)  . metronidazole 500 mg (04/26/20 1130)  . TPN ADULT (ION) 80 mL/hr at 04/26/20 0600  . TPN ADULT (ION)       Time spent: 29  minutes with over 50% of the time coordinating the patient's care    Harold Hedge, DO [facility] Pager [phone]  Call night coverage person covering after 7pm

## 2020-04-26 NOTE — Progress Notes (Signed)
Pt has agreed to a lab stick for blood work this AM. Lab notified.

## 2020-04-27 ENCOUNTER — Inpatient Hospital Stay (HOSPITAL_COMMUNITY): Payer: Medicare Other

## 2020-04-27 DIAGNOSIS — E871 Hypo-osmolality and hyponatremia: Secondary | ICD-10-CM | POA: Diagnosis not present

## 2020-04-27 DIAGNOSIS — K56609 Unspecified intestinal obstruction, unspecified as to partial versus complete obstruction: Secondary | ICD-10-CM | POA: Diagnosis not present

## 2020-04-27 DIAGNOSIS — R1084 Generalized abdominal pain: Secondary | ICD-10-CM | POA: Diagnosis not present

## 2020-04-27 LAB — CBC
HCT: 31.1 % — ABNORMAL LOW (ref 36.0–46.0)
Hemoglobin: 10.2 g/dL — ABNORMAL LOW (ref 12.0–15.0)
MCH: 33.9 pg (ref 26.0–34.0)
MCHC: 32.8 g/dL (ref 30.0–36.0)
MCV: 103.3 fL — ABNORMAL HIGH (ref 80.0–100.0)
Platelets: 232 10*3/uL (ref 150–400)
RBC: 3.01 MIL/uL — ABNORMAL LOW (ref 3.87–5.11)
RDW: 13.6 % (ref 11.5–15.5)
WBC: 17.6 10*3/uL — ABNORMAL HIGH (ref 4.0–10.5)
nRBC: 0 % (ref 0.0–0.2)

## 2020-04-27 LAB — COMPREHENSIVE METABOLIC PANEL
ALT: 10 U/L (ref 0–44)
AST: 12 U/L — ABNORMAL LOW (ref 15–41)
Albumin: 2.1 g/dL — ABNORMAL LOW (ref 3.5–5.0)
Alkaline Phosphatase: 98 U/L (ref 38–126)
Anion gap: 4 — ABNORMAL LOW (ref 5–15)
BUN: 24 mg/dL — ABNORMAL HIGH (ref 8–23)
CO2: 26 mmol/L (ref 22–32)
Calcium: 7.7 mg/dL — ABNORMAL LOW (ref 8.9–10.3)
Chloride: 101 mmol/L (ref 98–111)
Creatinine, Ser: 0.6 mg/dL (ref 0.44–1.00)
GFR calc Af Amer: >60 mL/min (ref >60)
GFR calc non Af Amer: >60 mL/min (ref >60)
Glucose, Bld: 143 mg/dL — ABNORMAL HIGH (ref 70–99)
Potassium: 4.6 mmol/L (ref 3.5–5.1)
Sodium: 131 mmol/L — ABNORMAL LOW (ref 135–145)
Total Bilirubin: 0.3 mg/dL (ref 0.3–1.2)
Total Protein: 6.2 g/dL — ABNORMAL LOW (ref 6.5–8.1)

## 2020-04-27 LAB — MAGNESIUM: Magnesium: 1.8 mg/dL (ref 1.7–2.4)

## 2020-04-27 LAB — URINALYSIS, ROUTINE W REFLEX MICROSCOPIC
Bilirubin Urine: NEGATIVE
Glucose, UA: NEGATIVE mg/dL
Hgb urine dipstick: NEGATIVE
Ketones, ur: NEGATIVE mg/dL
Nitrite: NEGATIVE
Protein, ur: NEGATIVE mg/dL
Specific Gravity, Urine: 1.011 (ref 1.005–1.030)
pH: 5 (ref 5.0–8.0)

## 2020-04-27 LAB — PHOSPHORUS: Phosphorus: 4.1 mg/dL (ref 2.5–4.6)

## 2020-04-27 LAB — GLUCOSE, CAPILLARY
Glucose-Capillary: 120 mg/dL — ABNORMAL HIGH (ref 70–99)
Glucose-Capillary: 107 mg/dL — ABNORMAL HIGH (ref 70–99)

## 2020-04-27 MED ORDER — BOOST / RESOURCE BREEZE PO LIQD CUSTOM: 1.0000 | ORAL | Status: DC

## 2020-04-27 MED ORDER — HYDROMORPHONE HCL 1 MG/ML IJ SOLN
0.5000 mg | INTRAMUSCULAR | Status: DC | PRN
  Administered 2020-04-27 – 2020-05-01 (×17): 1 mg via INTRAVENOUS
  Filled 2020-04-27 (×17): qty 1

## 2020-04-27 MED ORDER — STERILE WATER FOR INJECTION IJ SOLN
INTRAMUSCULAR | Status: AC
  Filled 2020-04-27: qty 10

## 2020-04-27 MED ORDER — MAGNESIUM SULFATE 2 GM/50ML IV SOLN
2.0000 g | Freq: Once | INTRAVENOUS | Status: AC
  Administered 2020-04-27: 2 g via INTRAVENOUS
  Filled 2020-04-27: qty 50

## 2020-04-27 MED ORDER — SODIUM CHLORIDE 0.9 % IV BOLUS
500.0000 mL | Freq: Once | INTRAVENOUS | Status: AC
  Administered 2020-04-27: 500 mL via INTRAVENOUS

## 2020-04-27 MED ORDER — TRAVASOL 10 % IV SOLN: INTRAVENOUS | Status: AC

## 2020-04-27 MED ORDER — METHOCARBAMOL 500 MG PO TABS
500.0000 mg | ORAL_TABLET | Freq: Three times a day (TID) | ORAL | Status: DC
  Administered 2020-04-27 – 2020-05-02 (×6): 500 mg via ORAL
  Filled 2020-04-27 (×14): qty 1

## 2020-04-27 MED ORDER — ALTEPLASE 2 MG IJ SOLR
2.0000 mg | Freq: Once | INTRAMUSCULAR | Status: AC
  Administered 2020-04-27: 2 mg
  Filled 2020-04-27: qty 2

## 2020-04-27 MED ORDER — ACETAMINOPHEN 500 MG PO TABS
1000.0000 mg | ORAL_TABLET | Freq: Four times a day (QID) | ORAL | Status: DC
  Administered 2020-04-27 – 2020-05-03 (×6): 1000 mg via ORAL
  Filled 2020-04-27 (×13): qty 2

## 2020-04-27 MED ORDER — ENSURE ENLIVE PO LIQD: 237.0000 mL | ORAL | Status: DC

## 2020-04-27 MED ORDER — OXYCODONE HCL 5 MG PO TABS
5.0000 mg | ORAL_TABLET | ORAL | Status: DC | PRN
  Administered 2020-04-29 – 2020-05-01 (×11): 10 mg via ORAL
  Filled 2020-04-27 (×12): qty 2

## 2020-04-27 NOTE — Progress Notes (Signed)
Patient ID: Yolanda Watson, female   DOB: 02-10-1951, 69 y.o.   MRN: [id number]    6 Days Post-Op  Subjective: Patient continues to feel well.  Having BMs and no nausea.  Tolerated soft diet last night with a baked potato.  Drinking her breeze.  ROS: See above, otherwise other systems negative  Objective: Vital signs in last 24 hours: Temp:  [97.8 F (36.6 C)-98.1 F (36.7 C)] 98 F (36.7 C) (04/29 0449) Pulse Rate:  [85-97] 85 (04/29 0449) Resp:  [16-20] 16 (04/29 0449) BP: (95-104)/(62-70) 104/62 (04/29 0449) SpO2:  [97 %] 97 % (04/29 0449) Weight:  [53.8 kg] 53.8 kg (04/29 0500) Last BM Date: 04/27/20  Intake/Output from previous day: 04/28 0701 - 04/29 0700 In: 2981.6 [P.O.:360; I.V.:1906.1; IV Piggyback:715.5] Out: 2850 [Urine:2850] Intake/Output this shift: Total I/O In: -  Out: 200 [Urine:200]  PE: Abd: soft, still tender, but appropriate, +BS, VAC in place with serosang output.    Lab Results:  Recent Labs    04/26/20 0351 04/27/20 0420  WBC 15.9* 17.6*  HGB 10.2* 10.2*  HCT 31.7* 31.1*  PLT 178 232   BMET Recent Labs    04/26/20 0351 04/27/20 0420  NA 133* 131*  K 4.6 4.6  CL 103 101  CO2 24 26  GLUCOSE 139* 143*  BUN 20 24*  CREATININE 0.54 0.60  CALCIUM 7.9* 7.7*   PT/INR No results for input(s): LABPROT, INR in the last 72 hours. CMP     Component Value Date/Time   NA 131 (L) 04/27/2020 0420   NA 141 01/15/2018 0000   NA 138 08/07/2017 0835   K 4.6 04/27/2020 0420   K 4.1 08/07/2017 0835   CL 101 04/27/2020 0420   CO2 26 04/27/2020 0420   CO2 25 08/07/2017 0835   GLUCOSE 143 (H) 04/27/2020 0420   GLUCOSE 95 08/07/2017 0835   BUN 24 (H) 04/27/2020 0420   BUN 4 01/15/2018 0000   BUN 9.3 08/07/2017 0835   CREATININE 0.60 04/27/2020 0420   CREATININE 0.80 03/16/2020 1002   CREATININE 0.57 02/05/2018 1144   CREATININE 0.7 08/07/2017 0835   CALCIUM 7.7 (L) 04/27/2020 0420   CALCIUM 9.4 08/07/2017 0835   PROT 6.2 (L) 04/27/2020  0420   PROT 7.3 08/07/2017 0835   ALBUMIN 2.1 (L) 04/27/2020 0420   ALBUMIN 3.4 (L) 08/07/2017 0835   AST 12 (L) 04/27/2020 0420   AST 13 (L) 03/16/2020 1002   AST 25 08/07/2017 0835   ALT 10 04/27/2020 0420   ALT 11 03/16/2020 1002   ALT 21 08/07/2017 0835   ALKPHOS 98 04/27/2020 0420   ALKPHOS 138 08/07/2017 0835   BILITOT 0.3 04/27/2020 0420   BILITOT 0.4 03/16/2020 1002   BILITOT 0.49 08/07/2017 0835   GFRNONAA >60 04/27/2020 0420   GFRNONAA >60 03/16/2020 1002   GFRAA >60 04/27/2020 0420   GFRAA >60 03/16/2020 1002   Lipase     Component Value Date/Time   LIPASE 59 (H) 04/11/2020 1542       Studies/Results: DG CHEST PORT 1 VIEW  Result Date: 04/27/2020 CLINICAL DATA:  Leukocytosis. Shortness of breath and nausea. No metastatic colon cancer. EXAM: PORTABLE CHEST 1 VIEW COMPARISON:  04/20/2020 FINDINGS: Right IJ Port-A-Cath unchanged. Lungs are adequately inflated with new airspace process over the right upper lobe containing a 9 mm nodular component. No effusion. Cardiomediastinal silhouette and remainder of the exam is unchanged. IMPRESSION: New airspace process over the right upper lobe with small nodular component  likely infection. Recommend follow-up to resolution. Electronically Signed   By: Marin Olp M.D.   On: 04/27/2020 08:29    Anti-infectives: Anti-infectives (From admission, onward)   Start     Dose/Rate Route Frequency Ordered Stop   04/20/20 1000  ciprofloxacin (CIPRO) IVPB 400 mg     400 mg 200 mL/hr over 60 Minutes Intravenous Every 12 hours 04/20/20 0856     04/20/20 1000  metroNIDAZOLE (FLAGYL) IVPB 500 mg     500 mg 100 mL/hr over 60 Minutes Intravenous Every 8 hours 04/20/20 0856     04/19/20 0600  ciprofloxacin (CIPRO) IVPB 400 mg     400 mg 200 mL/hr over 60 Minutes Intravenous On call to O.R. 04/18/20 1449 04/19/20 1200       Assessment/Plan Chronic tobacco use Abdominal wall thrombus-on Xarelto Hx EtOH abuse AKI Malnutrition -  prealbumin7.9, on TNA  Metastatic colon cancerwith right hemicolectomy 2016 - ongoing chemotherapy, followed by Dr. Benay Spice -Excision of malignant mass from the abdominal including skin, muscle, fascia and peritoneum(15 x 12cm), abdominal reconstruction with [name], 10/03/17 Dr. Barry Dienes and [name]   POD8/6, s/p ex lap with SBR for contained perforation fromSBOand takeback for abdominal wall closure, Dr. Marlou Starks -VAC change on MWF -cont abx. WBC back up to 17.9. -CXR and UA negative.  Will follow for another day or so.  If continues to either trend up or to not normalize, may need CT of abdomen to rule out post op complication, but right now with no fevers or increasing abdominal pain.  Tolerating a soft diet.  Will follow -soft -mobilize, PT/OT -TNA, wean today per pharmacy to off  FEN -soft diet, ensure/breeze, wean TNA VTE -xarelto ID -cipro/Flagyl 4/22 -->   LOS: 16 days    Henreitta Cea , [facility] 04/27/2020, 9:28 AM Please see Amion for pager number during day hours 7:00am-4:30pm or 7:00am -11:30am on weekends

## 2020-04-27 NOTE — Progress Notes (Signed)
PROGRESS NOTE    Yolanda Watson    Code Status: DNR  FV:[id number] DOB: [birth year] DOA: 04/11/2020 LOS: 16 days  PCP: Patient, No Pcp Per CC:  Chief Complaint  Patient presents with  . Abdominal Pain  . Constipation  . 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  General surgery was subsequently consulted and patient underwent exploratory laparotomy with small bowel resection with Dr. Marlou Starks (4/21) and closure of abdomen with application of wound VAC on 4/23    A & P   Principal Problem:   SBO (small bowel obstruction) ([place]) Active Problems:   Abdominal pain   Nausea & vomiting   Acute hyponatremia   AKI (acute kidney injury) ([place])   Leukocytosis   1. SBO with perforation s/p exploratory laparotomy with small bowel resection with Dr. Marlou Starks (4/21) and closure of abdomen with application of wound VAC on 4/23 a. NGT out b. WBC improved c. Pain moderately controlled d. Day 9/TBD Cipro/Flagyl e. In for TNA wean for pharmacy to off and advance diet per surgery  2. Coffee-ground fluid from NG tube, appears resolved a. Continue Protonix  3. Acute hypoxic respiratory failure, likely from postop atelectasis and opiates, resolved  4. AKI resolved with IV fluids  5. Metabolic alkalosis secondary to GI losses, resolved  6. Hyperkalemia resolved  7. Metastatic colon cancer follows with Dr. Benay Spice outpatient  8. Asymptomatic pancreatic duct dilation a. Noted on CT scan b. Outpatient follow-up  9. Known IVC infrarenal thrombus a. Back on home Xarelto  10. Hyponatremia a. 500 cc bolus of IV saline  DVT prophylaxis: Xarelto Family Communication: Patient updated at bedside Disposition Plan:  Status is:  Inpatient  Remains inpatient appropriate because:IV treatments appropriate due to intensity of illness or inability to take PO   Dispo: The patient is from: Home              Anticipated d/c is to: SNF              Anticipated d/c date is: > 3 days              Patient currently is not medically stable to d/c.  Needs to tolerate p.o. intake without TNA and improved pain.  SNF at discharge   Pressure injury documentation    None  Consultants  General surgery   Procedures  s/p exploratory laparotomy with small bowel resection with Dr. Marlou Starks (4/21) and closure of abdomen with application of wound VAC on 4/23  Antibiotics   Anti-infectives (From admission, onward)   Start     Dose/Rate Route Frequency Ordered Stop   04/20/20 1000  ciprofloxacin (CIPRO) IVPB 400 mg     400 mg 200 mL/hr over 60 Minutes Intravenous Every 12 hours 04/20/20 0856     04/20/20 1000  metroNIDAZOLE (FLAGYL) IVPB 500 mg     500 mg 100 mL/hr over 60 Minutes Intravenous Every 8 hours 04/20/20 0856     04/19/20 0600  ciprofloxacin (CIPRO) IVPB 400 mg     400 mg 200 mL/hr over 60 Minutes Intravenous On call to O.R. 04/18/20 1449 04/19/20 1200        Subjective   Improved pain today and in good spirits without any  complaints.  No overnight events Objective   Vitals:   04/26/20 1342 04/26/20 2124 04/27/20 0449 04/27/20 0500  BP: 104/70 95/69 104/62   Pulse: 87 97 85   Resp: 16 20 16    Temp: 97.8 F (36.6 C) 98.1 F (36.7 C) 98 F (36.7 C)   TempSrc: Oral Oral    SpO2: 97% 97% 97%   Weight:    53.8 kg  Height:        Intake/Output Summary (Last 24 hours) at 04/27/2020 1545 Last data filed at 04/27/2020 1445 Gross per 24 hour  Intake 3346.33 ml  Output 950 ml  Net 2396.33 ml   Filed Weights   04/23/20 0500 04/24/20 0500 04/27/20 0500  Weight: 53.3 kg 55.2 kg 53.8 kg    Examination:  Physical Exam Vitals and nursing note reviewed.  Constitutional:      Appearance: Normal appearance.   HENT:     Head: Normocephalic and atraumatic.  Eyes:     Conjunctiva/sclera: Conjunctivae normal.  Cardiovascular:     Rate and Rhythm: Normal rate and regular rhythm.  Pulmonary:     Effort: Pulmonary effort is normal.     Breath sounds: Normal breath sounds.  Abdominal:     General: Abdomen is flat.     Palpations: Abdomen is soft.  Musculoskeletal:        General: No swelling or tenderness.  Skin:    Coloration: Skin is not jaundiced or pale.  Neurological:     Mental Status: She is alert. Mental status is at baseline.  Psychiatric:        Mood and Affect: Mood normal.        Behavior: Behavior normal.     Data Reviewed: I have personally reviewed following labs and imaging studies  CBC: Recent Labs  Lab 04/23/20 0738 04/24/20 0517 04/25/20 0637 04/26/20 0351 04/27/20 0420  WBC 17.5* 13.6* 16.6* 15.9* 17.6*  NEUTROABS  --  10.7*  --   --   --   HGB 11.7* 10.9* 10.5* 10.2* 10.2*  HCT 37.0 33.8* 32.8* 31.7* 31.1*  MCV 106.0* 103.4* 104.5* 103.6* 103.3*  PLT 156 139* 106* 178 [id number]   Basic Metabolic Panel: Recent Labs  Lab 04/22/20 0702 04/23/20 0317 04/23/20 0644 04/24/20 0517 04/25/20 0637 04/26/20 0351 04/27/20 0420  NA   < >  --  136 134*  133* 134* 133* 131*  K   < >  --  4.8 4.3  4.2 4.7 4.6 4.6  CL   < >  --  104 104  104 103 103 101  CO2   < >  --  26 23  23 24 24 26   GLUCOSE   < >  --  126* 141*  143* 119* 139* 143*  BUN   < >  --  17 16  16 16 20  24*  CREATININE   < >  --  0.48 0.53  0.51 0.51 0.54 0.60  CALCIUM   < >  --  7.8* 7.7*  7.6* 7.8* 7.9* 7.7*  MG  --  1.8  --  1.5* 1.9 1.8 1.8  PHOS   < >  --  3.3 3.8  3.8 4.0 3.7 4.1   < > = values in this interval not displayed.   GFR: Estimated Creatinine Clearance: 58 mL/min (by C-G formula based on SCr of 0.6 mg/dL). Liver Function Tests: Recent Labs  Lab 04/23/20 [id number] 04/24/20 0517 04/25/20 [id number] 04/26/20 0351 04/27/20 0420  AST  --  14*  --   --  12*  ALT  --  11  --   --   10  ALKPHOS  --  102  --   --  98  BILITOT  --  0.4  --   --  0.3  PROT  --  5.4*  --   --  6.2*  ALBUMIN 2.3* 2.0*  2.0* 2.1* 2.3* 2.1*   No results for input(s): LIPASE, AMYLASE in the last 168 hours. No results for input(s): AMMONIA in the last 168 hours. Coagulation Profile: No results for input(s): INR, PROTIME in the last 168 hours. Cardiac Enzymes: No results for input(s): CKTOTAL, CKMB, CKMBINDEX, TROPONINI in the last 168 hours. BNP (last 3 results) No results for input(s): PROBNP in the last 8760 hours. HbA1C: No results for input(s): HGBA1C in the last 72 hours. CBG: Recent Labs  Lab 04/26/20 1142 04/26/20 1628 04/26/20 2344 04/27/20 0805 04/27/20 1141  GLUCAP 127* 107* 138* 120* 107*   Lipid Profile: No results for input(s): CHOL, HDL, LDLCALC, TRIG, CHOLHDL, LDLDIRECT in the last 72 hours. Thyroid Function Tests: No results for input(s): TSH, T4TOTAL, FREET4, T3FREE, THYROIDAB in the last 72 hours. Anemia Panel: No results for input(s): VITAMINB12, FOLATE, FERRITIN, TIBC, IRON, RETICCTPCT in the last 72 hours. Sepsis Labs: No results for input(s): PROCALCITON, LATICACIDVEN in the last 168 hours.  Recent Results (from the past 240 hour(s))  Surgical pcr screen     Status: None   Collection Time: 04/19/20  2:13 AM   Specimen: Nasal Mucosa; Nasal Swab  Result Value Ref Range Status   MRSA, PCR NEGATIVE NEGATIVE Final   Staphylococcus aureus NEGATIVE NEGATIVE Final    Comment: (NOTE) The Xpert SA Assay (FDA approved for NASAL specimens in patients 54 years of age and older), is one component of a comprehensive surveillance program. It is not intended to diagnose infection nor to guide or monitor treatment. Performed at [facility], [place] [address], [place], [place] [address]          Radiology Studies: DG CHEST PORT 1 VIEW  Result Date: 04/27/2020 CLINICAL DATA:  Leukocytosis. Shortness of breath and nausea. No metastatic colon  cancer. EXAM: PORTABLE CHEST 1 VIEW COMPARISON:  04/20/2020 FINDINGS: Right IJ Port-A-Cath unchanged. Lungs are adequately inflated with new airspace process over the right upper lobe containing a 9 mm nodular component. No effusion. Cardiomediastinal silhouette and remainder of the exam is unchanged. IMPRESSION: New airspace process over the right upper lobe with small nodular component likely infection. Recommend follow-up to resolution. Electronically Signed   By: Marin Olp M.D.   On: 04/27/2020 08:29        Scheduled Meds: . acetaminophen  1,000 mg Oral Q6H  . alteplase  2 mg Intracatheter Once  . bisacodyl  10 mg Rectal Once  . Chlorhexidine Gluconate Cloth  6 each Topical Daily  . feeding supplement  1 Container Oral Q24H  . feeding supplement (ENSURE ENLIVE)  237 mL Oral Q24H  . mouth rinse  15 mL Mouth Rinse BID  . methocarbamol  500 mg Oral TID  . pantoprazole (PROTONIX) IV  40 mg Intravenous Q12H  . rivaroxaban  20 mg Oral Daily  . sodium chloride flush  10-40 mL Intracatheter Q12H  . sodium chloride flush  3 mL Intravenous Q12H  . sterile water (preservative free)       Continuous Infusions: . ciprofloxacin 400 mg (04/27/20 1006)  . metronidazole 500 mg (04/27/20 1005)  .  TPN ADULT (ION) 40 mL/hr at 04/27/20 1129     Time spent: 25 minutes with over 50% of the time coordinating the patient's care    Harold Hedge, DO [facility] Pager [phone]  Call night coverage person covering after 7pm

## 2020-04-27 NOTE — Progress Notes (Signed)
Nutrition Follow-up  RD working remotely.   DOCUMENTATION CODES:   Underweight  INTERVENTION:  - continue TPN/TPN weaning to be per Pharmacist. - will order Boost Breeze once/day, each supplement provides 250 kcal and 9 grams of protein. - will order Ensure Enlive once/day, each supplement provides 350 kcal and 20 grams of protein. - will order Magic Cup with lunch meals, each supplement provides 290 kcal and 9 grams of protein.    NUTRITION DIAGNOSIS:   Increased nutrient needs related to acute illness as evidenced by estimated needs. -revised, ongoing  GOAL:   Patient will meet greater than or equal to 90% of their needs -met  MONITOR:   PO intake, Supplement acceptance, Labs, Weight trends  ASSESSMENT:   69 y.o. 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 Patient presented 2/2 to abdominal pain and found to have a SBO.  Significant Events: 4/13- admission; NGT placed in R nare 4/19- TPN initiation via R chest port 4/21- ex lap, repair of multiple serosal tears, small bowel resection, LOAs, wound vac placement 4/23- ex lap, closure of abdomen, wound vac placement 4/26- NGT removed 4/28- diet advanced from NPO to CLD at 1020 and then to Regular at 1600   No intakes documented since diet advancement yesterday. Will order supplements as outlined above to aid in beginning transition off of TPN while still meeting estimated needs.  Patient is currently receiving custom TPN at goal rate of 80 ml/hr which is providing 2016 kcal and 115 grams protein to meet 100% estimated needs.  Weight has been stable since 4/22.  Per notes: - poor pain control with dilaudid every 2 hours PRN and Tylenol added - metastatic colon cancer - pancreatic duct dilation with plan for outpatient follow-up - likely d/c plan is for SNF   Labs reviewed; CBG: 120 mg/dl, Na: 131 mmol/l, BUN: 24  mg/dl, Ca: 7.7 mg/dl. Medications reviewed; sliding scale novolog, 2 g IV Mg sulafate/day x1 dose 4/28, 40 mg IV protonix BID.     NUTRITION - FOCUSED PHYSICAL EXAM:  unable to complete at this time.   Diet Order:   Diet Order            Diet regular Room service appropriate? Yes; Fluid consistency: Thin  Diet effective now              EDUCATION NEEDS:   Not appropriate for education at this time  Skin:  Skin Assessment: Skin Integrity Issues: Skin Integrity Issues:: Incisions Incisions: abdomen (4/21 + 4/23)  Last BM:  4/28  Height:   Ht Readings from Last 1 Encounters:  04/24/20 '5\' 6"'$  (1.676 m)    Weight:   Wt Readings from Last 1 Encounters:  04/27/20 53.8 kg    Ideal Body Weight:  59.1 kg  BMI:  Body mass index is 19.14 kg/m.  Estimated Nutritional Needs:   Kcal:  1895-2130 kcal  Protein:  105-120 grams  Fluid:  >/= 2 L/day     Jarome Matin, MS, RD, LDN, CNSC Inpatient Clinical Dietitian RD pager # available in AMION  After hours/weekend pager # available in [facility]

## 2020-04-27 NOTE — Progress Notes (Signed)
PHARMACY - TOTAL PARENTERAL NUTRITION CONSULT NOTE   Indication: small bowel obstruction, perforation  Patient Measurements: Height: 5\' 6"  (167.6 cm) Weight: 53.8 kg (118 lb 9.7 oz) IBW/kg (Calculated) : 59.3 TPN AdjBW (KG): 55.2 Body mass index is 19.14 kg/m.  Assessment: 55 y/oF with PMH of metastatic colon cancer, abdominal wall thrombus who presented on 04/11/2020 secondary to abdominal pain and found to have SBO, perforation. Pharmacy consulted for TPN.   Glucose / Insulin: No hx of DM, CBG at goal <150 (range 107-143).  Sensitive SSI q8h - 2 units administered in past 24h. Electrolytes:  Na (131) low. Mg (1.8) lower end of normal. All other lytes WNL including CorrCa 9.2.  Renal: SCr WNL, stable. BUN slightly elevated LFTs / TGs: LFTs WNL (4/29)/Triglycerides 78 (4/26) Prealbumin: 14.7 >> 5  >> 7.9 Intake / Output; MIVF: mIVF d/c'd GI Imaging:  -4/13 CT abd/pelvis: SBO with transition point in the mid abdomen -4/19 AXR: Mild improvement in the pattern of partial small bowel obstruction with interval evidence of mucosal thickening involving the dilated loops of jejunum without pneumatosis.  Surgeries / Procedures:  -4/21: Ex lap with small bowel resection & wound vac -4/23: Ex lap with abdominal closure & washout & wound vac  Central access: port  TPN start date: 04/17/2020  Nutritional Goals: per RD recommendation on 4/26: Kcal: 1895-2130 kcal, Protein: 105-120 grams, Fluid:  >/= 2 L/day  Custom TPN at goal rate of 80 mL/hr will provide 115 g protein, 2016 kcal; meeting 100% of patient needs.   Current Nutrition:  -TPN -Diet: Regular  Pt tolerating advanced diet, pharmacy consulted to wean off of TPN today by surgery.  Plan:   Now:   Reduce TPN to 40 mL/hr (1/2 of goal rate). Let TPN run until 1800 this evening then turn off.   Will discontinue SSI, CBG checks, and all TPN labs. Pharmacy to sign off. Please re-consult if needed.   Lenis Noon,  PharmD 04/27/20 11:12 AM

## 2020-04-27 NOTE — Progress Notes (Signed)
VAST RN called unit and spoke with pt's nurse regarding port without blood return and need to treat. Requested Cristy, RN speak with physician this morning during rounds to get permission to turn TPN off for approx 2 hrs this afternoon prior to new bag so that TPA can be instilled. Cristy, RN to place IV consult if physician is okay with above request.

## 2020-04-27 NOTE — TOC Progression Note (Signed)
Transition of Care [facility]) - Progression Note    Patient Details  Name: Yolanda Watson MRN: [id number] Date of Birth: 18-Aug-1951  Transition of Care [facility]) CM/SW [place], [place] Phone Number: 04/27/2020, 4:01 PM  Clinical Narrative:    CSW met with the patient and bedside to discuss discharge plan, SNF recommendation. Patient declines SNF placement and prefers to go home with home health.  Patient will discharge with Wound Vac-[facility]. Changed MWF Referral made to [place]. Physician signed application/faxed.  Rep. Olivia Mackie to coordinate Wound Vac Care for Home use.   TOC staff will continue to follow.    Expected Discharge Plan: [place] Barriers to Discharge: Continued Medical Work up  Expected Discharge Plan and Services Expected Discharge Plan: [place]   Discharge Planning Services: CM Consult Post Acute Care Choice: [place] arrangements for the past 2 months: Single Family Home                 DME Arranged: Vac DME Agency: [facility] Date DME Agency Contacted: 04/27/20 Time DME Agency Contacted: [phone] Representative spoke with at DME Agency: Olivia Mackie HH Arranged: RN, PT, OT, Nurse's Aide, Social Work [employer] Agency: [facility] Date [place]: 04/25/20 Time [place]: 1000 Representative spoke with at [place]: amy   Social Determinants of Health ([place]) Interventions    Readmission Risk Interventions No flowsheet data found.

## 2020-04-28 DIAGNOSIS — R1084 Generalized abdominal pain: Secondary | ICD-10-CM | POA: Diagnosis not present

## 2020-04-28 DIAGNOSIS — E871 Hypo-osmolality and hyponatremia: Secondary | ICD-10-CM | POA: Diagnosis not present

## 2020-04-28 DIAGNOSIS — K56609 Unspecified intestinal obstruction, unspecified as to partial versus complete obstruction: Secondary | ICD-10-CM | POA: Diagnosis not present

## 2020-04-28 LAB — CBC
HCT: 30.3 % — ABNORMAL LOW (ref 36.0–46.0)
Hemoglobin: 9.9 g/dL — ABNORMAL LOW (ref 12.0–15.0)
MCH: 33.3 pg (ref 26.0–34.0)
MCHC: 32.7 g/dL (ref 30.0–36.0)
MCV: 102 fL — ABNORMAL HIGH (ref 80.0–100.0)
Platelets: 233 10*3/uL (ref 150–400)
RBC: 2.97 MIL/uL — ABNORMAL LOW (ref 3.87–5.11)
RDW: 13.5 % (ref 11.5–15.5)
WBC: 19.7 10*3/uL — ABNORMAL HIGH (ref 4.0–10.5)
nRBC: 0 % (ref 0.0–0.2)

## 2020-04-28 LAB — BASIC METABOLIC PANEL
Anion gap: 7 (ref 5–15)
BUN: 17 mg/dL (ref 8–23)
CO2: 23 mmol/L (ref 22–32)
Calcium: 7.7 mg/dL — ABNORMAL LOW (ref 8.9–10.3)
Chloride: 103 mmol/L (ref 98–111)
Creatinine, Ser: 0.59 mg/dL (ref 0.44–1.00)
GFR calc Af Amer: >60 mL/min (ref >60)
GFR calc non Af Amer: >60 mL/min (ref >60)
Glucose, Bld: 104 mg/dL — ABNORMAL HIGH (ref 70–99)
Potassium: 4 mmol/L (ref 3.5–5.1)
Sodium: 133 mmol/L — ABNORMAL LOW (ref 135–145)

## 2020-04-28 LAB — MAGNESIUM: Magnesium: 1.8 mg/dL (ref 1.7–2.4)

## 2020-04-28 NOTE — Progress Notes (Signed)
PROGRESS NOTE    MERRILIE RIDPATH    Code Status: DNR  [id number] DOB: 14-May-1951 DOA: 04/11/2020 LOS: 17 days  PCP: Patient, No Pcp Per CC:  Chief Complaint  Patient presents with  . Abdominal Pain  . Constipation  . 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  General surgery was subsequently consulted and patient underwent exploratory laparotomy with small bowel resection with Dr. Marlou Starks (4/21) and closure of abdomen with application of wound VAC on 4/23    A & P   Principal Problem:   SBO (small bowel obstruction) ([place]) Active Problems:   Abdominal pain   Nausea & vomiting   Acute hyponatremia   AKI (acute kidney injury) ([place])   Leukocytosis   1. SBO with perforation s/p exploratory laparotomy with small bowel resection with Dr. Marlou Starks (4/21) and closure of abdomen with application of wound VAC on 4/23 a. WBC increased, currently 19 without clear sign of infection b. Pain moderately controlled but better each day c. Day 10/TBD Cipro/Flagyl d. Off TPN tolerating p.o. intake  2. Leukocytosis a. RUL consolidation on CXR however patient is afebrile without shortness of breath or cough and is on antibiotics so this is unlikely an infection but should be monitored b. Patient has had a white count ranging between 11-20 since she has been hospitalized c. Leukocytosis most likely reactive but I agree with surgery that this should be at least downtrending prior to discharge d. Incentive spirometry in case component of atelectasis  3. Coffee-ground fluid from NG tube, appears resolved a. Continue Protonix  4. Acute hypoxic respiratory failure, likely from postop atelectasis and opiates, resolved  5. AKI resolved with IV  fluids  6. Metabolic alkalosis secondary to GI losses, resolved  7. Hyperkalemia resolved  8. Metastatic colon cancer follows with Dr. Benay Spice outpatient  9. Asymptomatic pancreatic duct dilation a. Noted on CT scan b. Outpatient follow-up  10. Known IVC infrarenal thrombus a. Back on home Xarelto  11. Hyponatremia a. Improved with IV fluids b. encourage p.o. intake  DVT prophylaxis: Xarelto Family Communication: Patient updated at bedside Disposition Plan:  Status is: Inpatient  Remains inpatient appropriate because:IV treatments appropriate due to intensity of illness or inability to take PO   Dispo: The patient is from: Home              Anticipated d/c is to: Home              Anticipated d/c date is: 2 days              Patient currently is not medically stable to d/c.  Needs to have improved WBC and general surgery sign off.  Hopefully discharge in the next 48 hours.  Patient refusing SNF at this time and plan is for home at DC   Pressure injury documentation    None  Consultants  General surgery   Procedures  s/p exploratory laparotomy with small bowel resection with Dr. Marlou Starks (4/21) and closure of abdomen with application of wound VAC on 4/23  Antibiotics   Anti-infectives (From admission, onward)   Start     Dose/Rate Route Frequency Ordered Stop   04/20/20 1000  ciprofloxacin (CIPRO) IVPB 400 mg  400 mg 200 mL/hr over 60 Minutes Intravenous Every 12 hours 04/20/20 0856     04/20/20 1000  metroNIDAZOLE (FLAGYL) IVPB 500 mg     500 mg 100 mL/hr over 60 Minutes Intravenous Every 8 hours 04/20/20 0856     04/19/20 0600  ciprofloxacin (CIPRO) IVPB 400 mg     400 mg 200 mL/hr over 60 Minutes Intravenous On call to O.R. 04/18/20 1449 04/19/20 1200        Subjective   Denies any complaints today.  Denies any chest pain, shortness of breath, nausea or vomiting or leg swelling.  No events overnight.  States she does have persistent abdominal pain  though it is improving and better than 4 Objective   Vitals:   04/27/20 2233 04/28/20 0500 04/28/20 0626 04/28/20 1334  BP: 106/62  (!) 96/59 102/66  Pulse: 81  78 74  Resp: 16  18 17   Temp: 98.3 F (36.8 C)  97.7 F (36.5 C) 98.1 F (36.7 C)  TempSrc: Oral  Oral Oral  SpO2: 93%  94% 97%  Weight:  56.5 kg    Height:        Intake/Output Summary (Last 24 hours) at 04/28/2020 1537 Last data filed at 04/28/2020 1400 Gross per 24 hour  Intake 882.02 ml  Output 1150 ml  Net -267.98 ml   Filed Weights   04/24/20 0500 04/27/20 0500 04/28/20 0500  Weight: 55.2 kg 53.8 kg 56.5 kg    Examination:  Physical Exam Vitals and nursing note reviewed.  HENT:     Head: Normocephalic.  Cardiovascular:     Rate and Rhythm: Normal rate.  Pulmonary:     Effort: Pulmonary effort is normal. No respiratory distress.     Breath sounds: No wheezing.  Abdominal:     Comments: Postoperative tenderness to palpation  Neurological:     Mental Status: She is alert.  Psychiatric:        Mood and Affect: Mood normal.        Behavior: Behavior normal.     Data Reviewed: I have personally reviewed following labs and imaging studies  CBC: Recent Labs  Lab 04/24/20 0517 04/25/20 0637 04/26/20 0351 04/27/20 0420 04/28/20 0335  WBC 13.6* 16.6* 15.9* 17.6* 19.7*  NEUTROABS 10.7*  --   --   --   --   HGB 10.9* 10.5* 10.2* 10.2* 9.9*  HCT 33.8* 32.8* 31.7* 31.1* 30.3*  MCV 103.4* 104.5* 103.6* 103.3* 102.0*  PLT 139* 106* 178 232 0000000   Basic Metabolic Panel: Recent Labs  Lab 04/23/20 0644 04/23/20 0644 04/24/20 0517 04/25/20 0637 04/26/20 0351 04/27/20 0420 04/28/20 0335  NA 136   < > 134*  133* 134* 133* 131* 133*  K 4.8   < > 4.3  4.2 4.7 4.6 4.6 4.0  CL 104   < > 104  104 103 103 101 103  CO2 26   < > 23  23 24 24 26 23   GLUCOSE 126*   < > 141*  143* 119* 139* 143* 104*  BUN 17   < > 16  16 16 20  24* 17  CREATININE 0.48   < > 0.53  0.51 0.51 0.54 0.60 0.59  CALCIUM  7.8*   < > 7.7*  7.6* 7.8* 7.9* 7.7* 7.7*  MG  --    < > 1.5* 1.9 1.8 1.8 1.8  PHOS 3.3  --  3.8  3.8 4.0 3.7 4.1  --    < > = values  in this interval not displayed.   GFR: Estimated Creatinine Clearance: 60.9 mL/min (by C-G formula based on SCr of 0.59 mg/dL). Liver Function Tests: Recent Labs  Lab 04/23/20 0644 04/24/20 0517 04/25/20 0637 04/26/20 0351 04/27/20 0420  AST  --  14*  --   --  12*  ALT  --  11  --   --  10  ALKPHOS  --  102  --   --  98  BILITOT  --  0.4  --   --  0.3  PROT  --  5.4*  --   --  6.2*  ALBUMIN 2.3* 2.0*  2.0* 2.1* 2.3* 2.1*   No results for input(s): LIPASE, AMYLASE in the last 168 hours. No results for input(s): AMMONIA in the last 168 hours. Coagulation Profile: No results for input(s): INR, PROTIME in the last 168 hours. Cardiac Enzymes: No results for input(s): CKTOTAL, CKMB, CKMBINDEX, TROPONINI in the last 168 hours. BNP (last 3 results) No results for input(s): PROBNP in the last 8760 hours. HbA1C: No results for input(s): HGBA1C in the last 72 hours. CBG: Recent Labs  Lab 04/26/20 1142 04/26/20 1628 04/26/20 2344 04/27/20 0805 04/27/20 1141  GLUCAP 127* 107* 138* 120* 107*   Lipid Profile: No results for input(s): CHOL, HDL, LDLCALC, TRIG, CHOLHDL, LDLDIRECT in the last 72 hours. Thyroid Function Tests: No results for input(s): TSH, T4TOTAL, FREET4, T3FREE, THYROIDAB in the last 72 hours. Anemia Panel: No results for input(s): VITAMINB12, FOLATE, FERRITIN, TIBC, IRON, RETICCTPCT in the last 72 hours. Sepsis Labs: No results for input(s): PROCALCITON, LATICACIDVEN in the last 168 hours.  Recent Results (from the past 240 hour(s))  Surgical pcr screen     Status: None   Collection Time: 04/19/20  2:13 AM   Specimen: Nasal Mucosa; Nasal Swab  Result Value Ref Range Status   MRSA, PCR NEGATIVE NEGATIVE Final   Staphylococcus aureus NEGATIVE NEGATIVE Final    Comment: (NOTE) The Xpert SA Assay (FDA approved for NASAL  specimens in patients 79 years of age and older), is one component of a comprehensive surveillance program. It is not intended to diagnose infection nor to guide or monitor treatment. Performed at [facility], [place] [address] [place], [place] [address]          Radiology Studies: DG CHEST PORT 1 VIEW  Result Date: 04/27/2020 CLINICAL DATA:  Leukocytosis. Shortness of breath and nausea. No metastatic colon cancer. EXAM: PORTABLE CHEST 1 VIEW COMPARISON:  04/20/2020 FINDINGS: Right IJ Port-A-Cath unchanged. Lungs are adequately inflated with new airspace process over the right upper lobe containing a 9 mm nodular component. No effusion. Cardiomediastinal silhouette and remainder of the exam is unchanged. IMPRESSION: New airspace process over the right upper lobe with small nodular component likely infection. Recommend follow-up to resolution. Electronically Signed   By: Marin Olp M.D.   On: 04/27/2020 08:29        Scheduled Meds: . acetaminophen  1,000 mg Oral Q6H  . bisacodyl  10 mg Rectal Once  . Chlorhexidine Gluconate Cloth  6 each Topical Daily  . feeding supplement  1 Container Oral Q24H  . feeding supplement (ENSURE ENLIVE)  237 mL Oral Q24H  . mouth rinse  15 mL Mouth Rinse BID  . methocarbamol  500 mg Oral TID  . pantoprazole (PROTONIX) IV  40 mg Intravenous Q12H  . rivaroxaban  20 mg Oral Daily  . sodium chloride flush  10-40 mL Intracatheter Q12H  . sodium chloride  flush  3 mL Intravenous Q12H   Continuous Infusions: . ciprofloxacin 400 mg (04/28/20 1050)  . metronidazole 500 mg (04/28/20 0954)     Time spent: 27 minutes with over 50% of the time coordinating the patient's care    Harold Hedge, DO [facility] Pager [phone]  Call night coverage person covering after 7pm

## 2020-04-28 NOTE — Progress Notes (Signed)
Patient ID: Yolanda Watson, female   DOB: August 22, 1951, 69 y.o.   MRN: [id number]    7 Days Post-Op  Subjective: Didn't feel great this morning, but feeling better now after having a liquid BM.  Tolerating soft diet.  Had VAC change already this am.  States abdominal pain continues to improve.  No other complaints.  ROS: See above, otherwise other systems negative  Objective: Vital signs in last 24 hours: Temp:  [97.7 F (36.5 C)-98.3 F (36.8 C)] 97.7 F (36.5 C) (04/30 0626) Pulse Rate:  [78-87] 78 (04/30 0626) Resp:  [16-20] 18 (04/30 0626) BP: (96-111)/(59-69) 96/59 (04/30 0626) SpO2:  [93 %-98 %] 94 % (04/30 0626) Weight:  [56.5 kg] 56.5 kg (04/30 0500) Last BM Date: 04/27/20  Intake/Output from previous day: 04/29 0701 - 04/30 0700 In: 2573.4 [P.O.:476; I.V.:847.3; IV Piggyback:1250.1] Out: 1500 [Urine:1500] Intake/Output this shift: Total I/O In: 8.9 [IV Piggyback:8.9] Out: 200 [Urine:150; Drains:50]  PE: Abd: soft, VAC in place.  Skin is not erythematous, vac output is serosang, +BS  Lab Results:  Recent Labs    04/27/20 0420 04/28/20 0335  WBC 17.6* 19.7*  HGB 10.2* 9.9*  HCT 31.1* 30.3*  PLT 232 233   BMET Recent Labs    04/27/20 0420 04/28/20 0335  NA 131* 133*  K 4.6 4.0  CL 101 103  CO2 26 23  GLUCOSE 143* 104*  BUN 24* 17  CREATININE 0.60 0.59  CALCIUM 7.7* 7.7*   PT/INR No results for input(s): LABPROT, INR in the last 72 hours. CMP     Component Value Date/Time   NA 133 (L) 04/28/2020 0335   NA 141 01/15/2018 0000   NA 138 08/07/2017 0835   K 4.0 04/28/2020 0335   K 4.1 08/07/2017 0835   CL 103 04/28/2020 0335   CO2 23 04/28/2020 0335   CO2 25 08/07/2017 0835   GLUCOSE 104 (H) 04/28/2020 0335   GLUCOSE 95 08/07/2017 0835   BUN 17 04/28/2020 0335   BUN 4 01/15/2018 0000   BUN 9.3 08/07/2017 0835   CREATININE 0.59 04/28/2020 0335   CREATININE 0.80 03/16/2020 1002   CREATININE 0.57 02/05/2018 1144   CREATININE 0.7 08/07/2017  0835   CALCIUM 7.7 (L) 04/28/2020 0335   CALCIUM 9.4 08/07/2017 0835   PROT 6.2 (L) 04/27/2020 0420   PROT 7.3 08/07/2017 0835   ALBUMIN 2.1 (L) 04/27/2020 0420   ALBUMIN 3.4 (L) 08/07/2017 0835   AST 12 (L) 04/27/2020 0420   AST 13 (L) 03/16/2020 1002   AST 25 08/07/2017 0835   ALT 10 04/27/2020 0420   ALT 11 03/16/2020 1002   ALT 21 08/07/2017 0835   ALKPHOS 98 04/27/2020 0420   ALKPHOS 138 08/07/2017 0835   BILITOT 0.3 04/27/2020 0420   BILITOT 0.4 03/16/2020 1002   BILITOT 0.49 08/07/2017 0835   GFRNONAA >60 04/28/2020 0335   GFRNONAA >60 03/16/2020 1002   GFRAA >60 04/28/2020 0335   GFRAA >60 03/16/2020 1002   Lipase     Component Value Date/Time   LIPASE 59 (H) 04/11/2020 1542       Studies/Results: DG CHEST PORT 1 VIEW  Result Date: 04/27/2020 CLINICAL DATA:  Leukocytosis. Shortness of breath and nausea. No metastatic colon cancer. EXAM: PORTABLE CHEST 1 VIEW COMPARISON:  04/20/2020 FINDINGS: Right IJ Port-A-Cath unchanged. Lungs are adequately inflated with new airspace process over the right upper lobe containing a 9 mm nodular component. No effusion. Cardiomediastinal silhouette and remainder of the exam is unchanged.  IMPRESSION: New airspace process over the right upper lobe with small nodular component likely infection. Recommend follow-up to resolution. Electronically Signed   By: Marin Olp M.D.   On: 04/27/2020 08:29    Anti-infectives: Anti-infectives (From admission, onward)   Start     Dose/Rate Route Frequency Ordered Stop   04/20/20 1000  ciprofloxacin (CIPRO) IVPB 400 mg     400 mg 200 mL/hr over 60 Minutes Intravenous Every 12 hours 04/20/20 0856     04/20/20 1000  metroNIDAZOLE (FLAGYL) IVPB 500 mg     500 mg 100 mL/hr over 60 Minutes Intravenous Every 8 hours 04/20/20 0856     04/19/20 0600  ciprofloxacin (CIPRO) IVPB 400 mg     400 mg 200 mL/hr over 60 Minutes Intravenous On call to O.R. 04/18/20 1449 04/19/20 1200        Assessment/Plan Chronic tobacco use Abdominal wall thrombus-on Xarelto Hx EtOH abuse AKI Malnutrition - prealbumin7.9, on TNA  Metastatic colon cancerwith right hemicolectomy 2016 - ongoing chemotherapy, followed by Dr. Benay Spice -Excision of malignant mass from the abdominal including skin, muscle, fascia and peritoneum(15 x 12cm), abdominal reconstruction with [name], 10/03/17 Dr. Barry Dienes and [name]   POD9/7, s/p ex lap with SBR for contained perforation fromSBOand takeback for abdominal wall closure, Dr. Marlou Starks -VAC change on MWF -WBC up to 19.7 today.  No clear source of infection.  Abdomen continues to feel better per patient and she is tolerating a diet.  Continue to follow.  Would like to see this downtrending prior to discharge -CXR showed possible new process in RUL, but patient seems asymptomatic from this.  UA was negative -will continue to follow for need of CT of A/P but right now her belly is working well, hurting less, and she has no fever. -soft diet -mobilize, PT/OT  FEN -soft diet, ensure/breeze VTE -xarelto ID -cipro/Flagyl 4/22 -->   LOS: 17 days    Henreitta Cea , [facility] 04/28/2020, 10:54 AM Please see Amion for pager number during day hours 7:00am-4:30pm or 7:00am -11:30am on weekends

## 2020-04-28 NOTE — TOC Progression Note (Signed)
Transition of Care [facility]) - Progression Note    Patient Details  Name: Yolanda Watson MRN: [id number] Date of Birth: Sep 15, 1951  Transition of Care [facility]) CM/SW [place], LCSW Phone Number: 04/28/2020, 2:32 PM  Clinical Narrative:    Nursing staff confirm wound vac delivered to bedside. [facility] patient may potentially have a weekend d/c if medically stable.    Expected Discharge Plan: [place] Barriers to Discharge: Continued Medical Work up  Expected Discharge Plan and Services Expected Discharge Plan: [place]   Discharge Planning Services: CM Consult Post Acute Care Choice: [place] arrangements for the past 2 months: Single Family Home                 DME Arranged: Vac DME Agency: [facility] Date DME Agency Contacted: 04/27/20 Time DME Agency Contacted: [phone] Representative spoke with at DME Agency: Olivia Mackie HH Arranged: RN, PT, OT, Nurse's Aide, Social Work [employer] Agency: [facility] [place] Date [place]: 04/25/20 Time [place]: 1000 Representative spoke with at [place]: amy   Social Determinants of Health ([place]) Interventions    Readmission Risk Interventions No flowsheet data found.

## 2020-04-28 NOTE — Consult Note (Signed)
[place] Nurse wound follow up Midline abdominal wound, VAC therapy.  ACELL in place in distal segment.  VAc began alarming overnight and was removed.  New dressing applied.  Wound type: surgical Pressure Injury POA:NA Measurement: 20 cm x 3.6 cm x 1 cm with visible ACELL, unable to visualize distal segment.  Wound [id number] pink nongranulating  Drainage (amount, consistency, odor) moderate serosanguinous  No odor.  Periwound:intact abdomen Dressing procedure/placement/frequency: CLeanse abdominal wound with NS.  Apply nonadherent contact layer to ACELL (distal segment)   Black foam to proximal end wound bed and over contact layer. Cover with drape. Seal immediately achieved.  Change Mon/Wed/Fri WOC team will follow.  Domenic Moras MSN, RN, FNP-BC CWON Wound, Ostomy, Continence Nurse Pager [phone]

## 2020-04-28 NOTE — Progress Notes (Signed)
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  Patient was admitted to the hospital for further evaluation and treatment.  Patient failed to improve with medical management, she underwent oratory laparotomy, repair of multiple serosal tears, small bowel resection, lysis of adhesion, application of a wound VAC on 4/24.  Patient subsequently had exploratory laparotomy, closure of the abdomen with ACell gentry X thick 20 x 30 cm, application of wound VAC on 4/24.    PT Comments    Patient continues to self limit mobility progression and requires significant encouragement to participate in therapy sessions. Patient initially declining to mobilize and then requesting to sit up EOB. Pt required min assist and was educated on safety concerns with long sit technique for sitting up however pt not receptive to instructions for log roll technique. Thi PT educated pt on concerns for lack of willingness to participate in therapy and concerns for safety with ambulation and need to complete stair mobility. Pt will benefit from skilled PT interventions at SNF level follow up but will likely refuse. Acute PT will follow and progress as able.   Follow Up Recommendations  SNF;Supervision/Assistance - 24 hour     Equipment Recommendations  None recommended by PT    Recommendations for Other Services       Precautions / Restrictions Precautions Precautions: Fall Precaution Comments: wound vac Restrictions Weight Bearing Restrictions: No    Mobility  Bed Mobility Overal bed mobility: Needs Assistance Bed Mobility: Supine to Sit     Supine to sit: Min assist;HOB  elevated     General bed mobility comments: pt stating "I'm not doing anything" today then deciding she would like to sit up to EOB. Pt unsafe and poor judgement attempting to sit up with bed rails up. Min assist required to stabilize trunk as pt attempting to move into long sit and reports she doesn't like to rol so doesn't want to learn to log roll. Pt sitting EOB at EOS as she was not agreeable to progress mobility further.  Transfers       Ambulation/Gait    Stairs    Wheelchair Mobility    Modified Rankin (Stroke Patients Only)       Balance Overall balance assessment: Needs assistance Sitting-balance support: Feet supported;Bilateral upper extremity supported Sitting balance-Leahy Scale: Fair         Cognition Arousal/Alertness: Awake/alert Behavior During Therapy: WFL for tasks assessed/performed Overall Cognitive Status: No family/caregiver present to determine baseline cognitive functioning      General Comments: pt continues to demonstrate poor insight and makes contradicotry statements throughout sessions stating "I know I need to move" "I know it's important to get stronger" then stating "I'm not doing anything".      Exercises      General Comments        Pertinent Vitals/Pain Pain Assessment: Faces Faces Pain Scale: Hurts little more Pain Location: Abdomen Pain Descriptors / Indicators: Grimacing;Aching;Guarding;Moaning;Crying Pain Intervention(s): Limited activity within patient's tolerance;Monitored during session           PT Goals (current goals can now be found in the care plan section) Acute Rehab PT Goals Patient Stated Goal: Patient wants to go home at discharge. PT Goal  Formulation: With patient Time For Goal Achievement: 05/10/20 Potential to Achieve Goals: Fair Progress towards PT goals: Not progressing toward goals - comment(pt self limiting mobility)    Frequency    Min 3X/week      PT Plan Current plan remains  appropriate       AM-PAC PT "6 Clicks" Mobility   Outcome Measure  Help needed turning from your back to your side while in a flat bed without using bedrails?: A Little Help needed moving from lying on your back to sitting on the side of a flat bed without using bedrails?: A Little Help needed moving to and from a bed to a chair (including a wheelchair)?: A Lot Help needed standing up from a chair using your arms (e.g., wheelchair or bedside chair)?: A Lot Help needed to walk in hospital room?: Total Help needed climbing 3-5 steps with a railing? : Total 6 Click Score: 12    End of Session Equipment Utilized During Treatment: Gait belt Activity Tolerance: Patient limited by pain Patient left: in bed;with call bell/phone within reach;with bed alarm set(seated EOB with tray table ahead) Nurse Communication: Mobility status PT Visit Diagnosis: Unsteadiness on feet (R26.81);Muscle weakness (generalized) (M62.81);Difficulty in walking, not elsewhere classified (R26.2);Pain Pain - part of body: (abdomen)     Time: [id number] PT Time Calculation (min) (ACUTE ONLY): 12 min  Charges:  $Therapeutic Activity: 8-22 mins                     Verner Mould, DPT Physical Therapist with [facility] [phone]  04/28/2020 4:58 PM

## 2020-04-28 NOTE — Progress Notes (Signed)
ANTICOAGULATION CONSULT NOTE - Follow Up Consult  Pharmacy Consult for Xarelto Indication: Hx IVC thrombus  Allergies  Allergen Reactions  . Penicillins Other (See Comments)    Tolerated cefepime 01/01/2018 "PASSED OUT AS A CHILD" > ? SYNCOPE ? PATIENT HAS HAD A PCN REACTION WITH IMMEDIATE RASH, FACIAL/TONGUE/THROAT SWELLING, SOB, OR LIGHTHEADEDNESS WITH HYPOTENSION:  #  #  #  YES  #  #  #   Has patient had a PCN reaction causing severe rash involving mucus membranes or skin necrosis: No Has patient had a PCN reaction that required hospitalization: No Has patient had a PCN reaction occurring within the last 10 years: No   . Latex Itching and Rash    BED "CHUX PADS"    Patient Measurements: Height: 5\' 6"  (167.6 cm) Weight: 56.5 kg (124 lb 9 oz) IBW/kg (Calculated) : 59.3  Vital Signs: Temp: 97.7 F (36.5 C) (04/30 0626) Temp Source: Oral (04/30 0626) BP: 96/59 (04/30 0626) Pulse Rate: 78 (04/30 0626)  Labs: Recent Labs    04/26/20 0351 04/26/20 0351 04/27/20 0420 04/28/20 0335  HGB 10.2*   < > 10.2* 9.9*  HCT 31.7*  --  31.1* 30.3*  PLT 178  --  232 233  CREATININE 0.54  --  0.60 0.59   < > = values in this interval not displayed.    Estimated Creatinine Clearance: 60.9 mL/min (by C-G formula based on SCr of 0.59 mg/dL).   Medications:  Infusions:  . ciprofloxacin 400 mg (04/27/20 2155)  . metronidazole 500 mg (04/28/20 0954)    Assessment: 89 yoF admitted on 4/13 with SBO.  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   Earlier in admission, rivaroxaban was held for surgical procedures and pt was on enoxaparin 40 mg daily. Full dose AC with IV UFH was resumed on 4/26, transitioned to PTA rivaroxaban on 4/27.  Today, 04/28/2020: -CBC: Hgb (9.9) remains low but stable. Plt WNL -No signs of bleeding or complications noted -SCr 0.6, CrCl ~60 mL/min   Plan:   Continue rivaroxaban 20 mg PO daily with supper  Follow CBC at least q72h  while inpatient  Lenis Noon, PharmD 04/28/20 10:53 AM

## 2020-04-29 LAB — CBC
HCT: 29.5 % — ABNORMAL LOW (ref 36.0–46.0)
Hemoglobin: 9.5 g/dL — ABNORMAL LOW (ref 12.0–15.0)
MCH: 32.9 pg (ref 26.0–34.0)
MCHC: 32.2 g/dL (ref 30.0–36.0)
MCV: 102.1 fL — ABNORMAL HIGH (ref 80.0–100.0)
Platelets: 301 10*3/uL (ref 150–400)
RBC: 2.89 MIL/uL — ABNORMAL LOW (ref 3.87–5.11)
RDW: 13.5 % (ref 11.5–15.5)
WBC: 17.2 10*3/uL — ABNORMAL HIGH (ref 4.0–10.5)
nRBC: 0 % (ref 0.0–0.2)

## 2020-04-29 LAB — BASIC METABOLIC PANEL
Anion gap: 8 (ref 5–15)
BUN: 11 mg/dL (ref 8–23)
CO2: 23 mmol/L (ref 22–32)
Calcium: 7.8 mg/dL — ABNORMAL LOW (ref 8.9–10.3)
Chloride: 102 mmol/L (ref 98–111)
Creatinine, Ser: 0.56 mg/dL (ref 0.44–1.00)
GFR calc Af Amer: >60 mL/min (ref >60)
GFR calc non Af Amer: >60 mL/min (ref >60)
Glucose, Bld: 105 mg/dL — ABNORMAL HIGH (ref 70–99)
Potassium: 3.9 mmol/L (ref 3.5–5.1)
Sodium: 133 mmol/L — ABNORMAL LOW (ref 135–145)

## 2020-04-29 LAB — MAGNESIUM: Magnesium: 1.6 mg/dL — ABNORMAL LOW (ref 1.7–2.4)

## 2020-04-29 MED ORDER — SODIUM CHLORIDE 0.9 % IV SOLN
INTRAVENOUS | Status: DC | PRN
  Administered 2020-04-29 – 2020-04-30 (×2): 250 mL via INTRAVENOUS

## 2020-04-29 MED ORDER — MAGNESIUM SULFATE 2 GM/50ML IV SOLN
2.0000 g | Freq: Once | INTRAVENOUS | Status: AC
  Administered 2020-04-29: 2 g via INTRAVENOUS
  Filled 2020-04-29: qty 50

## 2020-04-29 MED ORDER — PANTOPRAZOLE SODIUM 40 MG PO TBEC
40.0000 mg | DELAYED_RELEASE_TABLET | Freq: Two times a day (BID) | ORAL | Status: DC
  Administered 2020-04-29 – 2020-05-04 (×10): 40 mg via ORAL
  Filled 2020-04-29 (×11): qty 1

## 2020-04-29 NOTE — Progress Notes (Signed)
Assessment & Plan: POD#10/8- s/p ex lap with SBR for contained perforation fromSBOand takeback for abdominal wall closure, Dr. Marlou Starks -VAC change on MWF -WBC improved 17.2 today -soft diet -mobilize, PT/OT  Chronic tobacco use Abdominal wall thrombus-on Xarelto Hx EtOH abuse AKI Malnutrition - prealbumin7.9, on TNA  Metastatic colon cancerwith right hemicolectomy 2016 - ongoing chemotherapy, followed by Dr. Benay Spice -Excision of malignant mass from the abdominal including skin, muscle, fascia and peritoneum(15 x 12cm), abdominal reconstruction with [name], 10/03/17 Dr. Barry Dienes and [name]   FEN -soft diet, ensure/breeze VTE -xarelto        Armandina Gemma, MD       [facility]       Office: [phone]   Chief Complaint: Small bowel perforation  Subjective: Patient feeling better, mild pain.  Egg muffin for breakfast.  Objective: Vital signs in last 24 hours: Temp:  [97.4 F (36.3 C)-98.4 F (36.9 C)] 97.4 F (36.3 C) (05/01 0525) Pulse Rate:  [74-85] 85 (05/01 0525) Resp:  [16-18] 16 (05/01 0525) BP: (102-109)/(55-75) 104/75 (05/01 0525) SpO2:  [93 %-99 %] 99 % (05/01 0525) Last BM Date: 04/28/20  Intake/Output from previous day: 04/30 0701 - 05/01 0700 In: 721.8 [P.O.:600; IV Piggyback:121.8] Out: 650 [Urine:600; Drains:50] Intake/Output this shift: Total I/O In: 240 [P.O.:180; I.V.:10; IV Piggyback:50] Out: 100 [Urine:100]  Physical Exam: HEENT - sclerae clear, mucous membranes moist Neck - soft Chest - clear bilaterally Cor - RRR Abdomen - mild distension; mild diffuse tenderness; VAC dressing intact Ext - no edema, non-tender Neuro - alert & oriented, no focal deficits  Lab Results:  Recent Labs    04/28/20 0335 04/29/20 0351  WBC 19.7* 17.2*  HGB 9.9* 9.5*  HCT 30.3* 29.5*  PLT 233 301   BMET Recent Labs    04/28/20 0335 04/29/20 0351  NA 133* 133*  K 4.0 3.9  CL 103 102  CO2 23 23  GLUCOSE  104* 105*  BUN 17 11  CREATININE 0.59 0.56  CALCIUM 7.7* 7.8*   PT/INR No results for input(s): LABPROT, INR in the last 72 hours. Comprehensive Metabolic Panel:    Component Value Date/Time   NA 133 (L) 04/29/2020 0351   NA 133 (L) 04/28/2020 0335   NA 141 01/15/2018 0000   NA 136 (A) 01/07/2018 0000   NA 138 08/07/2017 0835   NA 137 07/24/2017 0848   K 3.9 04/29/2020 0351   K 4.0 04/28/2020 0335   K 4.1 08/07/2017 0835   K 3.6 07/24/2017 0848   CL 102 04/29/2020 0351   CL 103 04/28/2020 0335   CO2 23 04/29/2020 0351   CO2 23 04/28/2020 0335   CO2 25 08/07/2017 0835   CO2 22 07/24/2017 0848   BUN 11 04/29/2020 0351   BUN 17 04/28/2020 0335   BUN 4 01/15/2018 0000   BUN 7 01/07/2018 0000   BUN 9.3 08/07/2017 0835   BUN 9.6 07/24/2017 0848   CREATININE 0.56 04/29/2020 0351   CREATININE 0.59 04/28/2020 0335   CREATININE 0.80 03/16/2020 1002   CREATININE 0.73 02/24/2020 1134   CREATININE 0.57 02/05/2018 1144   CREATININE 0.7 08/07/2017 0835   CREATININE 0.8 07/24/2017 0848   GLUCOSE 105 (H) 04/29/2020 0351   GLUCOSE 104 (H) 04/28/2020 0335   GLUCOSE 95 08/07/2017 0835   GLUCOSE 104 07/24/2017 0848   CALCIUM 7.8 (L) 04/29/2020 0351   CALCIUM 7.7 (L) 04/28/2020 0335   CALCIUM 9.4 08/07/2017 0835   CALCIUM 9.1  07/24/2017 0848   AST 12 (L) 04/27/2020 0420   AST 14 (L) 04/24/2020 0517   AST 13 (L) 03/16/2020 1002   AST 13 (L) 02/24/2020 1134   AST 25 08/07/2017 0835   AST 23 07/24/2017 0848   ALT 10 04/27/2020 0420   ALT 11 04/24/2020 0517   ALT 11 03/16/2020 1002   ALT 14 02/24/2020 1134   ALT 21 08/07/2017 0835   ALT 19 07/24/2017 0848   ALKPHOS 98 04/27/2020 0420   ALKPHOS 102 04/24/2020 0517   ALKPHOS 138 08/07/2017 0835   ALKPHOS 124 07/24/2017 0848   BILITOT 0.3 04/27/2020 0420   BILITOT 0.4 04/24/2020 0517   BILITOT 0.4 03/16/2020 1002   BILITOT 0.5 02/24/2020 1134   BILITOT 0.49 08/07/2017 0835   BILITOT 0.33 07/24/2017 0848   PROT 6.2 (L)  04/27/2020 0420   PROT 5.4 (L) 04/24/2020 0517   PROT 7.3 08/07/2017 0835   PROT 7.0 07/24/2017 0848   ALBUMIN 2.1 (L) 04/27/2020 0420   ALBUMIN 2.3 (L) 04/26/2020 0351   ALBUMIN 3.4 (L) 08/07/2017 0835   ALBUMIN 4.0 07/24/2017 0848    Studies/Results: No results found.    Armandina Gemma 04/29/2020  Patient ID: Yolanda Watson, female   DOB: Apr 10, 1951, 69 y.o.   MRN: [id number]

## 2020-04-29 NOTE — Progress Notes (Signed)
PROGRESS NOTE    Yolanda Watson    Code Status: DNR  [id number] DOB: 1951-07-09 DOA: 04/11/2020 LOS: 18 days  PCP: Patient, No Pcp Per CC:  Chief Complaint  Patient presents with  . Abdominal Pain  . Constipation  . 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  General surgery was subsequently consulted and patient underwent exploratory laparotomy with small bowel resection with Dr. Marlou Starks (4/21) and closure of abdomen with application of wound VAC on 4/23    A & P   Principal Problem:   SBO (small bowel obstruction) ([place]) Active Problems:   Abdominal pain   Nausea & vomiting   Acute hyponatremia   AKI (acute kidney injury) ([place])   Leukocytosis   1. SBO with perforation s/p exploratory laparotomy with small bowel resection with Dr. Marlou Starks (4/21) and closure of abdomen with application of wound VAC on 4/23 a. WBC improved 19> 17 b. Still needed a lot of IV dilaudid yesterday and she is concerned about her pain control today. She is going to try and stick to PO pain meds today and if she can tolerate PO pain regimen then possibly home tomorrow c. Day 11/TBD Cipro/Flagyl d. Off TPN tolerating p.o. intake  2. Leukocytosis, likely reactive to pain/surgery and improving a. RUL consolidation on CXR however patient is afebrile without shortness of breath or cough and is on antibiotics so this is unlikely an infection but should be monitored b. Patient has had a white count ranging between 11-20 since she has been hospitalized c. Incentive spirometry in case component of atelectasis  3. Coffee-ground fluid from NG tube, appears resolved a. Protonix IV to PO  4. Acute hypoxic respiratory failure, likely from postop atelectasis and  opiates, resolved  5. AKI resolved with IV fluids  6. Metabolic alkalosis secondary to GI losses, resolved  7. Hyperkalemia resolved  8. Hypomagnesemia, replete   9. Metastatic colon cancer follows with Dr. Benay Spice outpatient  47. Asymptomatic pancreatic duct dilation a. Noted on CT scan b. Outpatient follow-up  11. Known IVC infrarenal thrombus a. Back on home Xarelto  12. Hyponatremia a. Stable and tolerating 133, will monitor for now  DVT prophylaxis: Xarelto Family Communication: husband updated at bedside Disposition Plan:  Status is: Inpatient  Remains inpatient appropriate because:Ongoing active pain requiring inpatient pain management   Dispo: The patient is from: Home              Anticipated d/c is to: Home              Anticipated d/c date is: 1 days              Patient currently is not medically stable to d/c.  Needs to have good pain control on PO meds before DC   Pressure injury documentation    None  Consultants  General surgery   Procedures  s/p exploratory laparotomy with small bowel resection with Dr. Marlou Starks (4/21) and closure of abdomen with application of wound VAC on 4/23  Antibiotics   Anti-infectives (From admission, onward)   Start     Dose/Rate Route Frequency Ordered Stop   04/20/20 1000  ciprofloxacin (CIPRO) IVPB 400 mg     400 mg 200 mL/hr  over 60 Minutes Intravenous Every 12 hours 04/20/20 0856     04/20/20 1000  metroNIDAZOLE (FLAGYL) IVPB 500 mg     500 mg 100 mL/hr over 60 Minutes Intravenous Every 8 hours 04/20/20 0856     04/19/20 0600  ciprofloxacin (CIPRO) IVPB 400 mg     400 mg 200 mL/hr over 60 Minutes Intravenous On call to O.R. 04/18/20 1449 04/19/20 1200        Subjective   Still requiring IV pain meds yesterday. She is concerned about her pain today after working with PT later. She does not wish to go to SNF and wishes to be discharged to home with [facility] when ready. Tolerating diet and having BMs, no other  issues.   Objective   Vitals:   04/28/20 1334 04/28/20 1735 04/28/20 2027 04/29/20 0525  BP: 102/66 102/71 (!) 109/55 104/75  Pulse: 74 78 79 85  Resp: 17 18 16 16   Temp: 98.1 F (36.7 C) 98.2 F (36.8 C) 98.4 F (36.9 C) (!) 97.4 F (36.3 C)  TempSrc: Oral Oral    SpO2: 97% 97% 93% 99%  Weight:      Height:        Intake/Output Summary (Last 24 hours) at 04/29/2020 1033 Last data filed at 04/29/2020 0955 Gross per 24 hour  Intake 952.86 ml  Output 550 ml  Net 402.86 ml   Filed Weights   04/24/20 0500 04/27/20 0500 04/28/20 0500  Weight: 55.2 kg 53.8 kg 56.5 kg    Examination:  Physical Exam Vitals and nursing note reviewed.  Constitutional:      Appearance: Normal appearance.  HENT:     Head: Normocephalic and atraumatic.  Eyes:     Conjunctiva/sclera: Conjunctivae normal.  Cardiovascular:     Rate and Rhythm: Normal rate and regular rhythm.     Heart sounds: Murmur present.  Pulmonary:     Effort: Pulmonary effort is normal.     Breath sounds: Normal breath sounds.  Abdominal:     Comments: Wound vac Post surgical changes  Musculoskeletal:        General: No swelling or tenderness.     Comments: Chronic bilateral hand contractures  Skin:    Coloration: Skin is not jaundiced or pale.  Neurological:     Mental Status: She is alert. Mental status is at baseline.  Psychiatric:        Mood and Affect: Mood normal.        Behavior: Behavior normal.     Data Reviewed: I have personally reviewed following labs and imaging studies  CBC: Recent Labs  Lab 04/24/20 0517 04/24/20 0517 04/25/20 0637 04/26/20 0351 04/27/20 0420 04/28/20 0335 04/29/20 0351  WBC 13.6*   < > 16.6* 15.9* 17.6* 19.7* 17.2*  NEUTROABS 10.7*  --   --   --   --   --   --   HGB 10.9*   < > 10.5* 10.2* 10.2* 9.9* 9.5*  HCT 33.8*   < > 32.8* 31.7* 31.1* 30.3* 29.5*  MCV 103.4*   < > 104.5* 103.6* 103.3* 102.0* 102.1*  PLT 139*   < > 106* 178 232 233 301   < > = values in this  interval not displayed.   Basic Metabolic Panel: Recent Labs  Lab 04/23/20 0644 04/23/20 0644 04/24/20 0517 04/24/20 0517 04/25/20 0637 04/26/20 0351 04/27/20 0420 04/28/20 0335 04/29/20 0351  NA 136   < > 134*  133*   < > 134* 133* 131* 133*  133*  K 4.8   < > 4.3  4.2   < > 4.7 4.6 4.6 4.0 3.9  CL 104   < > 104  104   < > 103 103 101 103 102  CO2 26   < > 23  23   < > 24 24 26 23 23   GLUCOSE 126*   < > 141*  143*   < > 119* 139* 143* 104* 105*  BUN 17   < > 16  16   < > 16 20 24* 17 11  CREATININE 0.48   < > 0.53  0.51   < > 0.51 0.54 0.60 0.59 0.56  CALCIUM 7.8*   < > 7.7*  7.6*   < > 7.8* 7.9* 7.7* 7.7* 7.8*  MG  --    < > 1.5*   < > 1.9 1.8 1.8 1.8 1.6*  PHOS 3.3  --  3.8  3.8  --  4.0 3.7 4.1  --   --    < > = values in this interval not displayed.   GFR: Estimated Creatinine Clearance: 60.9 mL/min (by C-G formula based on SCr of 0.56 mg/dL). Liver Function Tests: Recent Labs  Lab 04/23/20 0644 04/24/20 0517 04/25/20 0637 04/26/20 0351 04/27/20 0420  AST  --  14*  --   --  12*  ALT  --  11  --   --  10  ALKPHOS  --  102  --   --  98  BILITOT  --  0.4  --   --  0.3  PROT  --  5.4*  --   --  6.2*  ALBUMIN 2.3* 2.0*  2.0* 2.1* 2.3* 2.1*   No results for input(s): LIPASE, AMYLASE in the last 168 hours. No results for input(s): AMMONIA in the last 168 hours. Coagulation Profile: No results for input(s): INR, PROTIME in the last 168 hours. Cardiac Enzymes: No results for input(s): CKTOTAL, CKMB, CKMBINDEX, TROPONINI in the last 168 hours. BNP (last 3 results) No results for input(s): PROBNP in the last 8760 hours. HbA1C: No results for input(s): HGBA1C in the last 72 hours. CBG: Recent Labs  Lab 04/26/20 1142 04/26/20 1628 04/26/20 2344 04/27/20 0805 04/27/20 1141  GLUCAP 127* 107* 138* 120* 107*   Lipid Profile: No results for input(s): CHOL, HDL, LDLCALC, TRIG, CHOLHDL, LDLDIRECT in the last 72 hours. Thyroid Function Tests: No results for  input(s): TSH, T4TOTAL, FREET4, T3FREE, THYROIDAB in the last 72 hours. Anemia Panel: No results for input(s): VITAMINB12, FOLATE, FERRITIN, TIBC, IRON, RETICCTPCT in the last 72 hours. Sepsis Labs: No results for input(s): PROCALCITON, LATICACIDVEN in the last 168 hours.  No results found for this or any previous visit (from the past 240 hour(s)).       Radiology Studies: No results found.      Scheduled Meds: . acetaminophen  1,000 mg Oral Q6H  . bisacodyl  10 mg Rectal Once  . Chlorhexidine Gluconate Cloth  6 each Topical Daily  . feeding supplement  1 Container Oral Q24H  . feeding supplement (ENSURE ENLIVE)  237 mL Oral Q24H  . mouth rinse  15 mL Mouth Rinse BID  . methocarbamol  500 mg Oral TID  . pantoprazole (PROTONIX) IV  40 mg Intravenous Q12H  . rivaroxaban  20 mg Oral Daily  . sodium chloride flush  10-40 mL Intracatheter Q12H  . sodium chloride flush  3 mL Intravenous Q12H   Continuous Infusions: . sodium chloride    .  ciprofloxacin 400 mg (04/28/20 2104)  . metronidazole 500 mg (04/29/20 0245)     Time spent 25 minutes with over 50% of the time coordinating the patient's care    Harold Hedge, DO [facility] Pager [phone]  Call night coverage person covering after 7pm

## 2020-04-30 LAB — CBC
HCT: 28.7 % — ABNORMAL LOW (ref 36.0–46.0)
Hemoglobin: 9.1 g/dL — ABNORMAL LOW (ref 12.0–15.0)
MCH: 32.5 pg (ref 26.0–34.0)
MCHC: 31.7 g/dL (ref 30.0–36.0)
MCV: 102.5 fL — ABNORMAL HIGH (ref 80.0–100.0)
Platelets: 333 10*3/uL (ref 150–400)
RBC: 2.8 MIL/uL — ABNORMAL LOW (ref 3.87–5.11)
RDW: 13.5 % (ref 11.5–15.5)
WBC: 13.1 10*3/uL — ABNORMAL HIGH (ref 4.0–10.5)
nRBC: 0 % (ref 0.0–0.2)

## 2020-04-30 LAB — BASIC METABOLIC PANEL
Anion gap: 4 — ABNORMAL LOW (ref 5–15)
BUN: 8 mg/dL (ref 8–23)
CO2: 26 mmol/L (ref 22–32)
Calcium: 7.6 mg/dL — ABNORMAL LOW (ref 8.9–10.3)
Chloride: 103 mmol/L (ref 98–111)
Creatinine, Ser: 0.56 mg/dL (ref 0.44–1.00)
GFR calc Af Amer: >60 mL/min (ref >60)
GFR calc non Af Amer: >60 mL/min (ref >60)
Glucose, Bld: 98 mg/dL (ref 70–99)
Potassium: 3.6 mmol/L (ref 3.5–5.1)
Sodium: 133 mmol/L — ABNORMAL LOW (ref 135–145)

## 2020-04-30 LAB — MAGNESIUM: Magnesium: 1.7 mg/dL (ref 1.7–2.4)

## 2020-04-30 NOTE — Progress Notes (Signed)
PROGRESS NOTE    Yolanda Watson    Code Status: DNR  [id number] DOB: Jan 12, 1951 DOA: 04/11/2020 LOS: 19 days  PCP: Patient, No Pcp Per CC:  Chief Complaint  Patient presents with  . Abdominal Pain  . Constipation  . 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  General surgery was subsequently consulted and patient underwent exploratory laparotomy with small bowel resection with Dr. Marlou Starks (4/21) and closure of abdomen with application of wound VAC on 4/23    A & P   Principal Problem:   SBO (small bowel obstruction) ([place]) Active Problems:   Abdominal pain   Nausea & vomiting   Acute hyponatremia   AKI (acute kidney injury) ([employer])   Leukocytosis   1. SBO with perforation s/p exploratory laparotomy with small bowel resection with Dr. Marlou Starks (4/21) and closure of abdomen with application of wound VAC on 4/23 a. Pain improving but not resolved. Tolerating Oxycodone PO b. Day 11/TBD Cipro/Flagyl. Will continue for today to make sure her WBC normalizes c. Off TPN, tolerating p.o. intake  2. Leukocytosis, likely reactive to pain/surgery and improving a. RUL consolidation on CXR however patient is afebrile without shortness of breath or cough and is on antibiotics so this is unlikely an infection but should be monitored b. Patient has had a white count ranging between 11-20 since she has been hospitalized c. WBC improved 19> 17-> 13. Discussed with surgery who wants the patient's white count to normalize before discharge to home especially since she is refusing SNF, which I agree d. Incentive spirometry in case component of atelectasis  3. Debility a. PT recommending SNF however patient refusing b. Husband requesting to work with  PT/OT so he knows how to help the patient at home  4. Coffee-ground fluid from NG tube, appears resolved a. Protonix PO  5. Acute hypoxic respiratory failure, likely from postop atelectasis and opiates, resolved  6. AKI resolved with IV fluids  7. Metabolic alkalosis secondary to GI losses, resolved  8. Hyperkalemia resolved  9. Hypomagnesemia, replete   10. Metastatic colon cancer follows with Dr. Benay Spice outpatient  11. Asymptomatic pancreatic duct dilation a. Noted on CT scan b. Outpatient follow-up  12. 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 Hyponatremia Stable  DVT prophylaxis: Xarelto Family Communication: husband updated at bedside yesterday Disposition Plan:  Status is: Inpatient  Remains inpatient appropriate because:IV treatments appropriate due to intensity of illness or inability to take PO   Dispo: The patient is from: Home              Anticipated d/c is to: Home              Anticipated d/c date is: 1 days              Patient currently is not medically stable to d/c.  Needs to have good pain control on PO meds, improved ambulation and Normalization of WBC with general surgery sign off.    Pressure injury documentation    None  Consultants  General surgery   Procedures  s/p exploratory laparotomy with small bowel resection with Dr. Marlou Starks (4/21) and closure of abdomen with application of wound VAC on 4/23  Antibiotics  Anti-infectives (From admission, onward)   Start     Dose/Rate Route Frequency Ordered Stop   04/20/20 1000  ciprofloxacin (CIPRO) IVPB 400 mg     400 mg 200 mL/hr over 60 Minutes Intravenous Every 12 hours 04/20/20 0856     04/20/20 1000  metroNIDAZOLE (FLAGYL) IVPB 500 mg     500 mg 100 mL/hr over 60 Minutes Intravenous Every 8 hours 04/20/20 0856     04/19/20 0600  ciprofloxacin (CIPRO) IVPB 400 mg     400 mg 200 mL/hr over 60 Minutes Intravenous On call to O.R. 04/18/20 1449 04/19/20 1200        Subjective    States her pain is better controlled today and she is tolerating Oxycodone. She is tolerating diet and is very motivated to try and ambulate today. She is hoping to go home soon  Objective   Vitals:   04/28/20 2027 04/29/20 0525 04/29/20 1308 04/29/20 2223  BP: (!) 109/55 104/75 110/61 105/61  Pulse: 79 85 82 84  Resp: 16 16 16 18   Temp: 98.4 F (36.9 C) (!) 97.4 F (36.3 C) 98.4 F (36.9 C) (!) 97.4 F (36.3 C)  TempSrc:   Oral Oral  SpO2: 93% 99% 100% 95%  Weight:      Height:        Intake/Output Summary (Last 24 hours) at 04/30/2020 1335 Last data filed at 04/30/2020 0945 Gross per 24 hour  Intake 1241.7 ml  Output 425 ml  Net 816.7 ml   Filed Weights   04/24/20 0500 04/27/20 0500 04/28/20 0500  Weight: 55.2 kg 53.8 kg 56.5 kg    Examination:  Physical Exam Vitals and nursing note reviewed.  Constitutional:      Appearance: Normal appearance.  HENT:     Head: Normocephalic and atraumatic.  Eyes:     Conjunctiva/sclera: Conjunctivae normal.  Cardiovascular:     Rate and Rhythm: Normal rate and regular rhythm.  Pulmonary:     Effort: Pulmonary effort is normal.     Breath sounds: Normal breath sounds.  Abdominal:     Comments: Wound vac in place  Musculoskeletal:        General: No swelling or tenderness.  Skin:    Coloration: Skin is not jaundiced or pale.  Neurological:     Mental Status: She is alert. Mental status is at baseline.  Psychiatric:        Mood and Affect: Mood normal.        Behavior: Behavior normal.     Data Reviewed: I have personally reviewed following labs and imaging studies  CBC: Recent Labs  Lab 04/24/20 0517 04/25/20 0637 04/26/20 0351 04/27/20 0420 04/28/20 0335 04/29/20 0351 04/30/20 0402  WBC 13.6*   < > 15.9* 17.6* 19.7* 17.2* 13.1*  NEUTROABS 10.7*  --   --   --   --   --   --   HGB 10.9*   < > 10.2* 10.2* 9.9* 9.5* 9.1*  HCT 33.8*   < > 31.7* 31.1* 30.3* 29.5* 28.7*  MCV 103.4*   < > 103.6* 103.3* 102.0*  102.1* 102.5*  PLT 139*   < > 178 232 233 301 333   < > = values in this interval not displayed.   Basic Metabolic Panel: Recent Labs  Lab 04/24/20 0517 04/24/20 0517 04/25/20 [id number] 04/25/20 [id number] 04/26/20 0351 04/27/20 0420 04/28/20 0335 04/29/20 0351 04/30/20 0402  NA 134*  133*   < > 134*   < > 133*  131* 133* 133* 133*  K 4.3  4.2   < > 4.7   < > 4.6 4.6 4.0 3.9 3.6  CL 104  104   < > 103   < > 103 101 103 102 103  CO2 23  23   < > 24   < > 24 26 23 23 26   GLUCOSE 141*  143*   < > 119*   < > 139* 143* 104* 105* 98  BUN 16  16   < > 16   < > 20 24* 17 11 8   CREATININE 0.53  0.51   < > 0.51   < > 0.54 0.60 0.59 0.56 0.56  CALCIUM 7.7*  7.6*   < > 7.8*   < > 7.9* 7.7* 7.7* 7.8* 7.6*  MG 1.5*   < > 1.9   < > 1.8 1.8 1.8 1.6* 1.7  PHOS 3.8  3.8  --  4.0  --  3.7 4.1  --   --   --    < > = values in this interval not displayed.   GFR: Estimated Creatinine Clearance: 60.9 mL/min (by C-G formula based on SCr of 0.56 mg/dL). Liver Function Tests: Recent Labs  Lab 04/24/20 0517 04/25/20 0637 04/26/20 0351 04/27/20 0420  AST 14*  --   --  12*  ALT 11  --   --  10  ALKPHOS 102  --   --  98  BILITOT 0.4  --   --  0.3  PROT 5.4*  --   --  6.2*  ALBUMIN 2.0*  2.0* 2.1* 2.3* 2.1*   No results for input(s): LIPASE, AMYLASE in the last 168 hours. No results for input(s): AMMONIA in the last 168 hours. Coagulation Profile: No results for input(s): INR, PROTIME in the last 168 hours. Cardiac Enzymes: No results for input(s): CKTOTAL, CKMB, CKMBINDEX, TROPONINI in the last 168 hours. BNP (last 3 results) No results for input(s): PROBNP in the last 8760 hours. HbA1C: No results for input(s): HGBA1C in the last 72 hours. CBG: Recent Labs  Lab 04/26/20 1142 04/26/20 1628 04/26/20 2344 04/27/20 0805 04/27/20 1141  GLUCAP 127* 107* 138* 120* 107*   Lipid Profile: No results for input(s): CHOL, HDL, LDLCALC, TRIG, CHOLHDL, LDLDIRECT in the last 72 hours. Thyroid  Function Tests: No results for input(s): TSH, T4TOTAL, FREET4, T3FREE, THYROIDAB in the last 72 hours. Anemia Panel: No results for input(s): VITAMINB12, FOLATE, FERRITIN, TIBC, IRON, RETICCTPCT in the last 72 hours. Sepsis Labs: No results for input(s): PROCALCITON, LATICACIDVEN in the last 168 hours.  No results found for this or any previous visit (from the past 240 hour(s)).       Radiology Studies: No results found.      Scheduled Meds: . acetaminophen  1,000 mg Oral Q6H  . Chlorhexidine Gluconate Cloth  6 each Topical Daily  . feeding supplement  1 Container Oral Q24H  . feeding supplement (ENSURE ENLIVE)  237 mL Oral Q24H  . mouth rinse  15 mL Mouth Rinse BID  . methocarbamol  500 mg Oral TID  . pantoprazole  40 mg Oral BID  . rivaroxaban  20 mg Oral Daily  . sodium chloride flush  10-40 mL Intracatheter Q12H  . sodium chloride flush  3 mL Intravenous Q12H   Continuous Infusions: . sodium chloride 10 mL/hr at 04/30/20 1047  . ciprofloxacin 400 mg (04/30/20 1046)  . metronidazole 500 mg (04/30/20 1049)     Time spent 26  minutes with over 50% of the time coordinating the patient's care    Harold Hedge, DO [facility] Pager [phone]  Call night coverage person covering after 7pm

## 2020-04-30 NOTE — Progress Notes (Signed)
Assessment & Plan: POD#11/9- s/p ex lap with SBR for contained perforation fromSBOand takeback for abdominal wall closure, Dr. Marlou Starks - VAC changes on MWF - WBC continues to improve off abx's - 13.1 this AM - softdiet tolerated - mobilize, PT/OT - plans to ambulate in halls today  Metastatic colon cancerwith right hemicolectomy 2016 - ongoing chemotherapy - Dr. Benay Spice - Excision of malignant mass from the abdominal including skin, muscle, fascia and peritoneum(15 x 12cm), abdominal reconstruction with [name], 10/03/17 Dr. Barry Dienes and Dr. Rayne Du, MD       [facility]       Office: [phone]   Chief Complaint: SBO, perforation  Subjective: Patient in good spirits, states she is going to walk in halls today.  Tolerating soft diet.  Objective: Vital signs in last 24 hours: Temp:  [97.4 F (36.3 C)-98.4 F (36.9 C)] 97.4 F (36.3 C) (05/01 2223) Pulse Rate:  [82-84] 84 (05/01 2223) Resp:  [16-18] 18 (05/01 2223) BP: (105-110)/(61) 105/61 (05/01 2223) SpO2:  [95 %-100 %] 95 % (05/01 2223) Last BM Date: 04/28/20  Intake/Output from previous day: 05/01 0701 - 05/02 0700 In: 1331.7 [P.O.:300; I.V.:118.1; IV Piggyback:913.6] Out: 325 [Urine:325] Intake/Output this shift: No intake/output data recorded.  Physical Exam: HEENT - sclerae clear, mucous membranes moist Neck - soft Abdomen - mild distension, mild diffuse tenderness; midline VAC intact   Lab Results:  Recent Labs    04/29/20 0351 04/30/20 0402  WBC 17.2* 13.1*  HGB 9.5* 9.1*  HCT 29.5* 28.7*  PLT 301 333   BMET Recent Labs    04/29/20 0351 04/30/20 0402  NA 133* 133*  K 3.9 3.6  CL 102 103  CO2 23 26  GLUCOSE 105* 98  BUN 11 8  CREATININE 0.56 0.56  CALCIUM 7.8* 7.6*   PT/INR No results for input(s): LABPROT, INR in the last 72 hours. Comprehensive Metabolic Panel:    Component Value Date/Time   NA 133 (L) 04/30/2020 0402   NA 133  (L) 04/29/2020 0351   NA 141 01/15/2018 0000   NA 136 (A) 01/07/2018 0000   NA 138 08/07/2017 0835   NA 137 07/24/2017 0848   K 3.6 04/30/2020 0402   K 3.9 04/29/2020 0351   K 4.1 08/07/2017 0835   K 3.6 07/24/2017 0848   CL 103 04/30/2020 0402   CL 102 04/29/2020 0351   CO2 26 04/30/2020 0402   CO2 23 04/29/2020 0351   CO2 25 08/07/2017 0835   CO2 22 07/24/2017 0848   BUN 8 04/30/2020 0402   BUN 11 04/29/2020 0351   BUN 4 01/15/2018 0000   BUN 7 01/07/2018 0000   BUN 9.3 08/07/2017 0835   BUN 9.6 07/24/2017 0848   CREATININE 0.56 04/30/2020 0402   CREATININE 0.56 04/29/2020 0351   CREATININE 0.80 03/16/2020 1002   CREATININE 0.73 02/24/2020 1134   CREATININE 0.57 02/05/2018 1144   CREATININE 0.7 08/07/2017 0835   CREATININE 0.8 07/24/2017 0848   GLUCOSE 98 04/30/2020 0402   GLUCOSE 105 (H) 04/29/2020 0351   GLUCOSE 95 08/07/2017 0835   GLUCOSE 104 07/24/2017 0848   CALCIUM 7.6 (L) 04/30/2020 0402   CALCIUM 7.8 (L) 04/29/2020 0351   CALCIUM 9.4 08/07/2017 0835   CALCIUM 9.1 07/24/2017 0848   AST 12 (L) 04/27/2020 0420   AST 14 (L) 04/24/2020 0517   AST 13 (L) 03/16/2020 1002   AST 13 (L)  02/24/2020 1134   AST 25 08/07/2017 0835   AST 23 07/24/2017 0848   ALT 10 04/27/2020 0420   ALT 11 04/24/2020 0517   ALT 11 03/16/2020 1002   ALT 14 02/24/2020 1134   ALT 21 08/07/2017 0835   ALT 19 07/24/2017 0848   ALKPHOS 98 04/27/2020 0420   ALKPHOS 102 04/24/2020 0517   ALKPHOS 138 08/07/2017 0835   ALKPHOS 124 07/24/2017 0848   BILITOT 0.3 04/27/2020 0420   BILITOT 0.4 04/24/2020 0517   BILITOT 0.4 03/16/2020 1002   BILITOT 0.5 02/24/2020 1134   BILITOT 0.49 08/07/2017 0835   BILITOT 0.33 07/24/2017 0848   PROT 6.2 (L) 04/27/2020 0420   PROT 5.4 (L) 04/24/2020 0517   PROT 7.3 08/07/2017 0835   PROT 7.0 07/24/2017 0848   ALBUMIN 2.1 (L) 04/27/2020 0420   ALBUMIN 2.3 (L) 04/26/2020 0351   ALBUMIN 3.4 (L) 08/07/2017 0835   ALBUMIN 4.0 07/24/2017 0848     Studies/Results: No results found.    Armandina Gemma 04/30/2020  Patient ID: Yolanda Watson, female   DOB: 11-Aug-1951, 69 y.o.   MRN: [id number]

## 2020-05-01 LAB — CBC
HCT: 27.2 % — ABNORMAL LOW (ref 36.0–46.0)
Hemoglobin: 8.7 g/dL — ABNORMAL LOW (ref 12.0–15.0)
MCH: 32.6 pg (ref 26.0–34.0)
MCHC: 32 g/dL (ref 30.0–36.0)
MCV: 101.9 fL — ABNORMAL HIGH (ref 80.0–100.0)
Platelets: 364 10*3/uL (ref 150–400)
RBC: 2.67 MIL/uL — ABNORMAL LOW (ref 3.87–5.11)
RDW: 13.4 % (ref 11.5–15.5)
WBC: 11.1 10*3/uL — ABNORMAL HIGH (ref 4.0–10.5)
nRBC: 0 % (ref 0.0–0.2)

## 2020-05-01 LAB — BASIC METABOLIC PANEL
Anion gap: 8 (ref 5–15)
BUN: <5 mg/dL — ABNORMAL LOW (ref 8–23)
CO2: 24 mmol/L (ref 22–32)
Calcium: 7.6 mg/dL — ABNORMAL LOW (ref 8.9–10.3)
Chloride: 102 mmol/L (ref 98–111)
Creatinine, Ser: 0.54 mg/dL (ref 0.44–1.00)
GFR calc Af Amer: >60 mL/min (ref >60)
GFR calc non Af Amer: >60 mL/min (ref >60)
Glucose, Bld: 105 mg/dL — ABNORMAL HIGH (ref 70–99)
Potassium: 3.1 mmol/L — ABNORMAL LOW (ref 3.5–5.1)
Sodium: 134 mmol/L — ABNORMAL LOW (ref 135–145)

## 2020-05-01 LAB — MAGNESIUM: Magnesium: 1.4 mg/dL — ABNORMAL LOW (ref 1.7–2.4)

## 2020-05-01 MED ORDER — MAGNESIUM SULFATE 2 GM/50ML IV SOLN
2.0000 g | Freq: Once | INTRAVENOUS | Status: AC
  Administered 2020-05-01: 2 g via INTRAVENOUS
  Filled 2020-05-01: qty 50

## 2020-05-01 MED ORDER — OXYCODONE HCL 5 MG PO TABS
5.0000 mg | ORAL_TABLET | ORAL | Status: DC | PRN
  Administered 2020-05-01 – 2020-05-02 (×4): 10 mg via ORAL
  Administered 2020-05-03: 5 mg via ORAL
  Administered 2020-05-03 – 2020-05-04 (×7): 10 mg via ORAL
  Filled 2020-05-01 (×12): qty 2

## 2020-05-01 MED ORDER — POTASSIUM CHLORIDE CRYS ER 20 MEQ PO TBCR
40.0000 meq | EXTENDED_RELEASE_TABLET | Freq: Once | ORAL | Status: DC
  Filled 2020-05-01 (×3): qty 2

## 2020-05-01 NOTE — Progress Notes (Addendum)
PROGRESS NOTE    Yolanda Watson    Code Status: DNR  FV:[id number] DOB: [birth year] DOA: 04/11/2020 LOS: 20 days  PCP: Patient, No Pcp Per CC:  Chief Complaint  Patient presents with  . Abdominal Pain  . Constipation  . 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  General surgery was subsequently consulted and patient underwent exploratory laparotomy with small bowel resection with Dr. Marlou Starks (4/21) and closure of abdomen with application of wound VAC on 4/23  Has been prolonged due to elevated white count as well as significant abdominal pain requiring IV pain meds and transitioning to p.o. has been difficult but improving.   A & P   Principal Problem:   SBO (small bowel obstruction) ([facility]) Active Problems:   Abdominal pain   Nausea & vomiting   Acute hyponatremia   AKI (acute kidney injury) ([place])   Leukocytosis   1. SBO with perforation s/p exploratory laparotomy with small bowel resection with Dr. Marlou Starks (4/21) and closure of abdomen with application of wound VAC on 4/23  a. Pain more severe today. tolerating Oxycodone PO but still requiring IV Dilaudid intermittently b. Completed 11 days of Cipro/Flagyl, can discontinue at this point his white count is nearly normalized and she is afebrile c. Off TPN, tolerating p.o. intake d. Wound VAC changes MWF e. If pain can get under better control then she can likely go home tomorrow  2. Leukocytosis, likely reactive to pain/surgery and improving a. Has significantly improved with IV antibiotics and incentive spirometry improving pain  3. Debility a. PT recommending SNF however patient refusing b. Patient to go home with home health likely tomorrow  4. Coffee-ground fluid from NG  tube, appears resolved a. Protonix PO  5. Acute hypoxic respiratory failure, likely from postop atelectasis and opiates, resolved  6. AKI resolved with IV fluids  7. Metabolic alkalosis secondary to GI losses, resolved  8. Hyperkalemia resolved  9. Hypokalemia replete via pO  10. Hypomagnesemia, replete   11. Metastatic colon cancer follows with Dr. Benay Spice outpatient  12. Asymptomatic pancreatic duct dilation a. Noted on CT scan b. Outpatient follow-up  13. 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 Hyponatremia Stable  DVT prophylaxis: Xarelto Family Communication: Patient updated Disposition Plan:  Status is: Inpatient  Remains inpatient appropriate because:Ongoing active pain requiring inpatient pain management   Dispo: The patient is from: Home              Anticipated d/c is to: Home              Anticipated d/c date is: 1 days              Patient currently is not medically stable to d/c.  Will likely be able to discharge tomorrow on p.o. pain regimen but with more severe pain today requiring IV Dilaudid  Pressure injury documentation    None  Consultants  General surgery   Procedures  s/p exploratory laparotomy with small bowel resection with Dr. Marlou Starks (4/21) and closure of abdomen with application of wound VAC on 4/23  Antibiotics   Anti-infectives (From admission, onward)   Start     Dose/Rate Route Frequency Ordered Stop   04/20/20 1000  ciprofloxacin (  CIPRO) IVPB 400 mg  Status:  Discontinued     400 mg 200 mL/hr over 60 Minutes Intravenous Every 12 hours 04/20/20 0856 05/01/20 1013   04/20/20 1000  metroNIDAZOLE (FLAGYL) IVPB 500 mg  Status:  Discontinued     500 mg 100 mL/hr over 60 Minutes Intravenous Every 8 hours 04/20/20 0856 05/01/20 1013   04/19/20 0600  ciprofloxacin (CIPRO) IVPB 400 mg     400 mg 200 mL/hr over 60 Minutes Intravenous On call to O.R. 04/18/20 1449 04/19/20 1200        Subjective   States that she had  significant generalized abdominal pain today and had her wound VAC changed as well.  Has been able to transfer with some assistance she reports.  Otherwise no issues  Objective   Vitals:   04/30/20 1341 04/30/20 2117 05/01/20 0500 05/01/20 1304  BP: 102/64 116/66 111/63 117/67  Pulse: 81 88 83 91  Resp: 16 16 16    Temp: 98.2 F (36.8 C) 97.9 F (36.6 C) 98.2 F (36.8 C) 98 F (36.7 C)  TempSrc: Oral Oral Oral Oral  SpO2: 95% 93% 97% 92%  Weight:   51.9 kg   Height:        Intake/Output Summary (Last 24 hours) at 05/01/2020 1351 Last data filed at 05/01/2020 1326 Gross per 24 hour  Intake 1277.59 ml  Output 150 ml  Net 1127.59 ml   Filed Weights   04/27/20 0500 04/28/20 0500 05/01/20 0500  Weight: 53.8 kg 56.5 kg 51.9 kg    Examination:  Physical Exam Vitals and nursing note reviewed.  Constitutional:      General: She is not in acute distress.    Appearance: Normal appearance.  HENT:     Head: Normocephalic and atraumatic.  Eyes:     Conjunctiva/sclera: Conjunctivae normal.  Cardiovascular:     Rate and Rhythm: Normal rate and regular rhythm.  Pulmonary:     Effort: Pulmonary effort is normal.     Breath sounds: Normal breath sounds.  Abdominal:     Comments: Wound VAC  Musculoskeletal:        General: No swelling or tenderness.  Skin:    Coloration: Skin is not jaundiced or pale.  Neurological:     Mental Status: She is alert. Mental status is at baseline.  Psychiatric:        Mood and Affect: Mood normal.        Behavior: Behavior normal.     Data Reviewed: I have personally reviewed following labs and imaging studies  CBC: Recent Labs  Lab 04/27/20 0420 04/28/20 0335 04/29/20 0351 04/30/20 0402 05/01/20 0353  WBC 17.6* 19.7* 17.2* 13.1* 11.1*  HGB 10.2* 9.9* 9.5* 9.1* 8.7*  HCT 31.1* 30.3* 29.5* 28.7* 27.2*  MCV 103.3* 102.0* 102.1* 102.5* 101.9*  PLT 232 233 301 333 [id number]   Basic Metabolic Panel: Recent Labs  Lab 04/25/20 0637  04/25/20 0637 04/26/20 0351 04/26/20 0351 04/27/20 0420 04/28/20 0335 04/29/20 0351 04/30/20 0402 05/01/20 0353  NA 134*   < > 133*   < > 131* 133* 133* 133* 134*  K 4.7   < > 4.6   < > 4.6 4.0 3.9 3.6 3.1*  CL 103   < > 103   < > 101 103 102 103 102  CO2 24   < > 24   < > 26 23 23 26 24   GLUCOSE 119*   < > 139*   < > 143* 104* 105* 98  105*  BUN 16   < > 20   < > 24* 17 11 8  <5*  CREATININE 0.51   < > 0.54   < > 0.60 0.59 0.56 0.56 0.54  CALCIUM 7.8*   < > 7.9*   < > 7.7* 7.7* 7.8* 7.6* 7.6*  MG 1.9   < > 1.8   < > 1.8 1.8 1.6* 1.7 1.4*  PHOS 4.0  --  3.7  --  4.1  --   --   --   --    < > = values in this interval not displayed.   GFR: Estimated Creatinine Clearance: 55.9 mL/min (by C-G formula based on SCr of 0.54 mg/dL). Liver Function Tests: Recent Labs  Lab 04/25/20 0637 04/26/20 0351 04/27/20 0420  AST  --   --  12*  ALT  --   --  10  ALKPHOS  --   --  98  BILITOT  --   --  0.3  PROT  --   --  6.2*  ALBUMIN 2.1* 2.3* 2.1*   No results for input(s): LIPASE, AMYLASE in the last 168 hours. No results for input(s): AMMONIA in the last 168 hours. Coagulation Profile: No results for input(s): INR, PROTIME in the last 168 hours. Cardiac Enzymes: No results for input(s): CKTOTAL, CKMB, CKMBINDEX, TROPONINI in the last 168 hours. BNP (last 3 results) No results for input(s): PROBNP in the last 8760 hours. HbA1C: No results for input(s): HGBA1C in the last 72 hours. CBG: Recent Labs  Lab 04/26/20 1142 04/26/20 1628 04/26/20 2344 04/27/20 0805 04/27/20 1141  GLUCAP 127* 107* 138* 120* 107*   Lipid Profile: No results for input(s): CHOL, HDL, LDLCALC, TRIG, CHOLHDL, LDLDIRECT in the last 72 hours. Thyroid Function Tests: No results for input(s): TSH, T4TOTAL, FREET4, T3FREE, THYROIDAB in the last 72 hours. Anemia Panel: No results for input(s): VITAMINB12, FOLATE, FERRITIN, TIBC, IRON, RETICCTPCT in the last 72 hours. Sepsis Labs: No results for input(s):  PROCALCITON, LATICACIDVEN in the last 168 hours.  No results found for this or any previous visit (from the past 240 hour(s)).       Radiology Studies: No results found.      Scheduled Meds: . acetaminophen  1,000 mg Oral Q6H  . Chlorhexidine Gluconate Cloth  6 each Topical Daily  . feeding supplement  1 Container Oral Q24H  . feeding supplement (ENSURE ENLIVE)  237 mL Oral Q24H  . mouth rinse  15 mL Mouth Rinse BID  . methocarbamol  500 mg Oral TID  . pantoprazole  40 mg Oral BID  . potassium chloride  40 mEq Oral Once  . rivaroxaban  20 mg Oral Daily  . sodium chloride flush  10-40 mL Intracatheter Q12H  . sodium chloride flush  3 mL Intravenous Q12H   Continuous Infusions: . sodium chloride 250 mL (04/30/20 1738)     Time spent 26 minutes with over 50% of the time coordinating the patient's care    Harold Hedge, DO [facility] Pager [phone]  Call night coverage person covering after 7pm

## 2020-05-01 NOTE — Progress Notes (Signed)
[facility] Progress Note  10 Days Post-Op  Subjective: CC-  Not sure if she will be able to go home today or not. States that she is still having a lot of abdominal pain, although overall it has improved. Oxycodone does help some, but she still required 2 doses IV dilaudid 1mg  yesterday. Not taking the scheduled tylenol or robaxin. Main concern is mobility. States that she got up to the chair for a while yesterday then had difficulty getting back up on her own. She feels weak. PT coming to see her today. They are recommending SNF but she is refusing and planning on discharge home with [facility]. Tolerating a diet and having bowel function, but not eating much. She does not like any of the protein supplements. Eating milk and cookies for breakfast.  Objective: Vital signs in last 24 hours: Temp:  [97.9 F (36.6 C)-98.2 F (36.8 C)] 98.2 F (36.8 C) (05/03 0500) Pulse Rate:  [81-88] 83 (05/03 0500) Resp:  [16] 16 (05/03 0500) BP: (102-116)/(63-66) 111/63 (05/03 0500) SpO2:  [93 %-97 %] 97 % (05/03 0500) Weight:  [51.9 kg] 51.9 kg (05/03 0500) Last BM Date: 04/28/20  Intake/Output from previous day: 05/02 0701 - 05/03 0700 In: 1187.4 [P.O.:270; I.V.:181; IV Piggyback:736.5] Out: 350 [Urine:350] Intake/Output this shift: No intake/output data recorded.  PE: Gen:  Alert, NAD, pleasant HEENT: EOM's intact, pupils equal and round Pulm:  rate and effort normal Abd: soft, mild diffuse tenderness, +BS, vac to midline incision with good seal  Lab Results:  Recent Labs    04/30/20 0402 05/01/20 0353  WBC 13.1* 11.1*  HGB 9.1* 8.7*  HCT 28.7* 27.2*  PLT 333 364   BMET Recent Labs    04/30/20 0402 05/01/20 0353  NA 133* 134*  K 3.6 3.1*  CL 103 102  CO2 26 24  GLUCOSE 98 105*  BUN 8 <5*  CREATININE 0.56 0.54  CALCIUM 7.6* 7.6*   PT/INR No results for input(s): LABPROT, INR in the last 72 hours. CMP     Component Value Date/Time   NA 134 (L) 05/01/2020 0353    NA 141 01/15/2018 0000   NA 138 08/07/2017 0835   K 3.1 (L) 05/01/2020 0353   K 4.1 08/07/2017 0835   CL 102 05/01/2020 0353   CO2 24 05/01/2020 0353   CO2 25 08/07/2017 0835   GLUCOSE 105 (H) 05/01/2020 0353   GLUCOSE 95 08/07/2017 0835   BUN <5 (L) 05/01/2020 0353   BUN 4 01/15/2018 0000   BUN 9.3 08/07/2017 0835   CREATININE 0.54 05/01/2020 0353   CREATININE 0.80 03/16/2020 1002   CREATININE 0.57 02/05/2018 1144   CREATININE 0.7 08/07/2017 0835   CALCIUM 7.6 (L) 05/01/2020 0353   CALCIUM 9.4 08/07/2017 0835   PROT 6.2 (L) 04/27/2020 0420   PROT 7.3 08/07/2017 0835   ALBUMIN 2.1 (L) 04/27/2020 0420   ALBUMIN 3.4 (L) 08/07/2017 0835   AST 12 (L) 04/27/2020 0420   AST 13 (L) 03/16/2020 1002   AST 25 08/07/2017 0835   ALT 10 04/27/2020 0420   ALT 11 03/16/2020 1002   ALT 21 08/07/2017 0835   ALKPHOS 98 04/27/2020 0420   ALKPHOS 138 08/07/2017 0835   BILITOT 0.3 04/27/2020 0420   BILITOT 0.4 03/16/2020 1002   BILITOT 0.49 08/07/2017 0835   GFRNONAA >60 05/01/2020 0353   GFRNONAA >60 03/16/2020 1002   GFRAA >60 05/01/2020 0353   GFRAA >60 03/16/2020 1002   Lipase  Component Value Date/Time   LIPASE 59 (H) 04/11/2020 1542       Studies/Results: No results found.  Anti-infectives: Anti-infectives (From admission, onward)   Start     Dose/Rate Route Frequency Ordered Stop   04/20/20 1000  ciprofloxacin (CIPRO) IVPB 400 mg     400 mg 200 mL/hr over 60 Minutes Intravenous Every 12 hours 04/20/20 0856     04/20/20 1000  metroNIDAZOLE (FLAGYL) IVPB 500 mg     500 mg 100 mL/hr over 60 Minutes Intravenous Every 8 hours 04/20/20 0856     04/19/20 0600  ciprofloxacin (CIPRO) IVPB 400 mg     400 mg 200 mL/hr over 60 Minutes Intravenous On call to O.R. 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 Benay Spice - Excision of malignant mass from the abdominal including skin, muscle, fascia and peritoneum(15 x 12cm), abdominal reconstruction with [name], 10/03/17 Dr. Barry Dienes and [name]  POD#12/10-s/p ex lap with SBR for contained perforation fromSBOand takeback for abdominal wall closure, Dr. Marlou Starks - VAC changes on MWF - will coordinate with [place] nurse today so I can see the wound - tolerating diet and having bowel function. I do think that she will eat a little better once she gets home - WBC continues to downtrend 11.1, afebrile - continue PT, mobilize. She has refused SNF and is planning discharge home with [facility] therapies. She has stairs she needs to go down to get into her home. [place] for discharge later today vs tomorrow from surgical standpoint if pain better under control and works with PT. Discharge instructions and follow up info on AVS  ID - cipro/flagyl 4/22>>day#11, ok to stop abx from surgical standpoint FEN - reg diet. K/Mag replaced by primary VTE - xarelto Foley - none Follow up - Dr. Marlou Starks, oncology   LOS: 20 days    Wellington [place], [facility] 05/01/2020, 8:43 AM Please see Amion for pager number during day hours 7:00am-4:30pm

## 2020-05-01 NOTE — Discharge Instructions (Signed)
Negative Pressure Wound Therapy Home Guide °Negative pressure wound therapy (NPWT) uses a sponge or foam-like material (dressing) placed on or inside the wound. The wound is then covered and sealed with a cover dressing that sticks to your skin (is adhesive). This keeps air out. A tube is attached to the cover dressing, and this tube connects to a small pump. The pump sucks fluid and germs from the wound. NPWT helps to increase blood flow to the wound and heal it from the inside. °What are the risks? °NPWT is usually safe to use. However, problems can occur, including: °· Skin irritation from the dressing adhesive. °· Bleeding. °· Infection. °· Dehydration. Wounds with large amounts of drainage can cause excessive fluid loss. °· Pain. °Supplies needed: °· A disposable garbage bag. °· Soap and water, or hand sanitizer. °· Wound cleanser or salt-water solution (saline). °· New sponge and cover dressing. °· Protective clothing. °· Gauze pad. °· Vinyl gloves. °· Tape. °· Skin protectant. This may be a wipe, film, or spray. °· Clean or germ-free (sterile) scissors. °· Eye protection. °How to change your dressing °Prepare to change your dressing ° °1. If told by your health care provider, take pain medicine 30 minutes before changing the dressing. °2. Wash your hands with soap and water. Dry your hands with a clean towel. If soap and water are not available, use hand sanitizer. °3. Set up a clean station for wound care. °4. Open the dressing package so that the sponge dressing remains on the inside of the package. °5. Wear gloves, protective clothing, and eye protection. °Remove old dressing ° °1. Turn off the pump and disconnect the tubing from the dressing. °2. Carefully remove the adhesive cover dressing in the direction of your hair growth. °3. Remove the sponge dressing that is inside the wound. If the sponge sticks, use a wound cleanser or saline solution to wet the sponge and help it come off more easily. °4. Throw  the old sponge and cover dressing supplies into the garbage bag. °5. Remove your gloves by grabbing the cuff and turning the glove inside out. Place the gloves in the trash immediately. °6. Wash your hands with soap and water. Dry your hands with a clean towel. If soap and water are not available, use hand sanitizer. °Clean your wound °· Wear gloves, protective clothing, and eye protection. °Follow your health care provider's instructions on how to clean your wound. You may be told to: °1. Clean the wound using a saline solution or a wound cleanser and a clean gauze pad. °2. Pat the wound dry with a gauze pad. Do not rub the wound. °3. Throw the gauze pad into the garbage bag. °4. Remove your gloves by grabbing the cuff and turning the glove inside out. Place the gloves in the trash immediately. °5. Wash your hands with soap and water. Dry your hands with a clean towel. If soap and water are not available, use hand sanitizer. °Apply new dressing °· Wear gloves, protective clothing, and eye protection. °1. If told by your health care provider, apply a skin protectant to any skin that will be exposed to adhesive. Let the skin protectant dry. °2. Cut a piece of new sponge dressing and put it on or in the wound. °3. Using clean scissors, cut a nickel-sized hole in the new cover dressing. °4. Apply the cover dressing. °5. Attach the suction tube over the hole in the cover dressing. °6. Take off your gloves. Put them in the   plastic bag with the old dressing. Tie the bag shut and throw it away. °7. Wash your hands with soap and water. Dry your hands with a clean towel. If soap and water are not available, use hand sanitizer. °8. Turn the pump back on. The sponge dressing should collapse. Do not change the settings on the machine without talking to a health care provider. °9. Replace the container in the pump that collects fluid if it is full. Replace the container per the manufacturer's instructions or at least once a  week, even if it is not full. °General tips and recommendations °If the alarm sounds: °· Stay calm. °· Do not turn off the pump or do anything with the dressing. °· Reasons the alarm may go off: °? The battery is low. Change the battery or plug the device into electrical power. °? The dressing has a leak. Find the leak and put tape over the leak. °? The fluid collection container is full. Change the fluid container. °· Call your health care provider right away if you cannot fix the problem. °· Explain to your health care provider what is happening. Follow his or her instructions. °General instructions °· Do not turn off the pump unless told to do so by your health care provider. °· Do not turn off the pump for more than 2 hours. If the pump is off for more than 2 hours, the dressing will need to be changed. °· If your health care provider says it is okay to shower: °? Do not take the pump into the shower. °? Make sure the wound dressing is protected and sealed. The wound dressing must stay dry. °· Check frequently that the machine indicates that therapy is on and that all clamps are open. °· Do not use over-the-counter medicated or antiseptic creams, sprays, liquids, or dressings unless your health care provider approves. °Contact a health care provider if: °· You have new pain. °· You develop irritation, a rash, or itching around the wound or dressing. °· You see new black or yellow tissue in your wound. °· The dressing changes are painful or cause bleeding. °· The pump has been off for more than 2 hours, and you do not know how to change the dressing. °· The pump alarm goes off, and you do not know what to do. °Get help right away if: °· You have a lot of bleeding. °· The wound breaks open. °· You have severe pain. °· You have signs of infection, such as: °? More redness, swelling, or pain. °? More fluid or blood. °? Warmth. °? Pus or a bad smell. °? Red streaks leading from the wound. °? A fever. °· You see a  sudden change in the color or texture of the drainage. °· You have signs of dehydration, such as: °? Little or no tears, urine, or sweat. °? Muscle cramps. °? Very dry mouth. °? Headache. °? Dizziness. °Summary °· Negative pressure wound therapy (NPWT) is a device that helps your wound heal. °· Set up a clean station for wound care. Your health care provider will tell you what supplies to use. °· Follow your health care provider's instructions on how to clean your wound and how to change the dressing. °· Contact a health care provider if you have new pain, an irritation, or a rash, or if the alarm goes off and you do not know what to do. °· Get help right away if you have a lot of bleeding, your wound breaks   open, or you have severe pain. Also, get help if you have signs of infection. °This information is not intended to replace advice given to you by your health care provider. Make sure you discuss any questions you have with your health care provider. °Document Revised: 04/09/2019 Document Reviewed: 03/05/2019 °Elsevier Patient Education © 2020 [employer] ° °

## 2020-05-01 NOTE — Progress Notes (Signed)
ANTICOAGULATION CONSULT NOTE - Follow Up Consult  Pharmacy Consult for Xarelto Indication: Hx IVC thrombus  Allergies  Allergen Reactions  . Penicillins Other (See Comments)    Tolerated cefepime 01/01/2018 "PASSED OUT AS A CHILD" > ? SYNCOPE ? PATIENT HAS HAD A PCN REACTION WITH IMMEDIATE RASH, FACIAL/TONGUE/THROAT SWELLING, SOB, OR LIGHTHEADEDNESS WITH HYPOTENSION:  #  #  #  YES  #  #  #   Has patient had a PCN reaction causing severe rash involving mucus membranes or skin necrosis: No Has patient had a PCN reaction that required hospitalization: No Has patient had a PCN reaction occurring within the last 10 years: No   . Latex Itching and Rash    BED "CHUX PADS"    Patient Measurements: Height: 5\' 6"  (167.6 cm) Weight: 51.9 kg (114 lb 6.7 oz) IBW/kg (Calculated) : 59.3  Vital Signs: Temp: 98.2 F (36.8 C) (05/03 0500) Temp Source: Oral (05/03 0500) BP: 111/63 (05/03 0500) Pulse Rate: 83 (05/03 0500)  Labs: Recent Labs    04/29/20 0351 04/29/20 0351 04/30/20 0402 05/01/20 0353  HGB 9.5*   < > 9.1* 8.7*  HCT 29.5*  --  28.7* 27.2*  PLT 301  --  333 364  CREATININE 0.56  --  0.56 0.54   < > = values in this interval not displayed.    Estimated Creatinine Clearance: 55.9 mL/min (by C-G formula based on SCr of 0.54 mg/dL).   Medications:  Infusions:  . sodium chloride 250 mL (04/30/20 1738)  . magnesium sulfate bolus IVPB      Assessment: 19 yoF admitted on 4/13 with SBO.  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   Earlier in admission, rivaroxaban was held for surgical procedures and pt was on enoxaparin 40 mg daily. Full dose AC with IV UFH was resumed on 4/26, transitioned to PTA rivaroxaban on 4/27.  Today, 05/01/2020: -CBC: Hgb (8.7) remains low but relatively stable. Plt WNL -SCr 0.54 - stable, WNL. CrCl ~55 mL/min -Confirmed with RN there are no signs of bruising or bleeding   Plan:   Continue rivaroxaban 20 mg PO daily  with supper  Patient has been stable on home dose of rivaroxaban since 4/27. Pharmacy to sign off at this time, please re-consult if needed.   Lenis Noon, PharmD 05/01/20 10:46 AM

## 2020-05-01 NOTE — Progress Notes (Signed)
Physical Therapy Treatment Patient Details Name: Yolanda Watson MRN: [id number] DOB: 03/07/1951 Today's Date: 05/01/2020    History of Present Illness 69 year old with past medical history significant for metastatic colon cancer on chemotherapy, chronic tobacco abuse, abdominal wall thrombosis on Xarelto presented to the hospital with complaints of abdominal pain and found to have an SBO.  Patient was admitted to the hospital for further evaluation and treatment.  Patient failed to improve with medical management, she underwent oratory laparotomy, repair of multiple serosal tears, small bowel resection, lysis of adhesion, application of a wound VAC on 4/24.  Patient subsequently had exploratory laparotomy, closure of the abdomen with ACell gentry X thick 20 x 30 cm, application of wound VAC on 4/24.    PT Comments    Pt agreeable to sit in recliner. Pt stated abdominal pain is a 3/10 in the bed and increased to a 9/10 with mobility. Pt stood with mod assist and guided to the chair with a stand pivot transfer. I placed a RW in the room and hopefully she will agree to attempt ambulation with RW on next visit. Pt is slowly progressing with mobility. Pt reports she wants to d/c home with family assisting. Recommend HHPT services and order a RW.  Pt will continue to benefit from acute skilled PT to maximize mobility and Independence.   Follow Up Recommendations  SNF;Supervision/Assistance - 24 hour     Equipment Recommendations  Rolling walker with 5" wheels    Recommendations for Other Services       Precautions / Restrictions Precautions Precautions: Fall Precaution Comments: wound vac Restrictions Weight Bearing Restrictions: No    Mobility  Bed Mobility Overal bed mobility: Needs Assistance Bed Mobility: Supine to Sit Rolling: Min assist   Supine to sit: Min assist;HOB elevated     General bed mobility comments: Pt reported significant adbominal pain requiring frequent rest breaks  between transition to sit EOB.  Transfers Overall transfer level: Needs assistance   Transfers: Sit to/from Stand;Stand Pivot Transfers Sit to Stand: Mod assist Stand pivot transfers: Mod assist       General transfer comment: Therapist in front. Pt stand pivot to chair with min/mod assist for balance. Pt with increased anxiety and was fearful of falling. Pt declined further ambulation. Pt with a h/o bilateral hand injury resulting in minimal use of hands. Pt reports she can pry her fingers out but they stay in a fisted position.  Ambulation/Gait                 Stairs             Wheelchair Mobility    Modified Rankin (Stroke Patients Only)       Balance Overall balance assessment: Needs assistance Sitting-balance support: Feet supported;Bilateral upper extremity supported Sitting balance-Leahy Scale: Fair     Standing balance support: Bilateral upper extremity supported;During functional activity Standing balance-Leahy Scale: Poor                              Cognition Arousal/Alertness: Awake/alert Behavior During Therapy: WFL for tasks assessed/performed Overall Cognitive Status: Within Functional Limits for tasks assessed                                 General Comments: Pt reported she is feeling better and moving better, but still is limited by abdominal pain.  Exercises      General Comments General comments (skin integrity, edema, etc.): Pts husband was present in the room during therapy.      Pertinent Vitals/Pain Pain Score: 9  Pain Location: Abdomen Pain Descriptors / Indicators: Aching;Discomfort;Throbbing;Guarding Pain Intervention(s): Limited activity within patient's tolerance;Monitored during session;Repositioned;Premedicated before session    Home Living                      Prior Function            PT Goals (current goals can now be found in the care plan section) Acute Rehab PT  Goals Patient Stated Goal: Patient wants to go home at discharge. Progress towards PT goals: Progressing toward goals    Frequency    Min 3X/week      PT Plan Current plan remains appropriate    Co-evaluation              AM-PAC PT "6 Clicks" Mobility   Outcome Measure    Help needed moving from lying on your back to sitting on the side of a flat bed without using bedrails?: A Little Help needed moving to and from a bed to a chair (including a wheelchair)?: A Lot Help needed standing up from a chair using your arms (e.g., wheelchair or bedside chair)?: A Lot Help needed to walk in hospital room?: A Lot Help needed climbing 3-5 steps with a railing? : Total 6 Click Score: 10    End of Session   Activity Tolerance: Patient limited by pain Patient left: in chair;with family/visitor present;with call bell/phone within reach Nurse Communication: Mobility status PT Visit Diagnosis: Unsteadiness on feet (R26.81);Muscle weakness (generalized) (M62.81);Difficulty in walking, not elsewhere classified (R26.2);Pain     Time: [id number] PT Time Calculation (min) (ACUTE ONLY): 27 min  Charges:  $Therapeutic Activity: 23-37 mins                    Lelon Mast 05/01/2020, 9:22 AM

## 2020-05-01 NOTE — Progress Notes (Signed)
Occupational Therapy Treatment Patient Details Name: Yolanda Watson MRN: [id number] DOB: 11/13/1951 Today's Date: 05/01/2020    History of present illness 69 year old with past medical history significant for metastatic colon cancer on chemotherapy, chronic tobacco abuse, abdominal wall thrombosis on Xarelto presented to the hospital with complaints of abdominal pain and found to have an SBO.  Patient was admitted to the hospital for further evaluation and treatment.  Patient failed to improve with medical management, she underwent oratory laparotomy, repair of multiple serosal tears, small bowel resection, lysis of adhesion, application of a wound VAC on 4/24.  Patient subsequently had exploratory laparotomy, closure of the abdomen with ACell gentry X thick 20 x 30 cm, application of wound VAC on 4/24.   OT comments  Pt needed MAX encouragement to sit EOB for dinner. Explained benefits of consistent mobility   Follow Up Recommendations  SNF(Pt continued to decline SNF - will need max HH services)    Equipment Recommendations  3 in 1 bedside commode    Recommendations for Other Services      Precautions / Restrictions Precautions Precautions: Fall Precaution Comments: wound vac Restrictions Weight Bearing Restrictions: No       Mobility Bed Mobility Overal bed mobility: Needs Assistance Bed Mobility: Supine to Sit Rolling: Min assist   Supine to sit: Min assist;HOB elevated        Transfers         Stand pivot transfers: Mod assist       General transfer comment: declined    Balance Overall balance assessment: Needs assistance Sitting-balance support: Feet supported;Bilateral upper extremity supported Sitting balance-Leahy Scale: Fair                                     ADL either performed or assessed with clinical judgement   ADL Overall ADL's : Needs assistance/impaired Eating/Feeding: Set up Eating/Feeding Details (indicate cue type and  reason): sat EOB                                   General ADL Comments: pt initially declined OT but then agreed to sit EOB for dinner with [facility] encouragement.  Pt able to feed self with set up with increased time and encouragement.  Encouraged pt to get to chair but pt declined even with much encouragement.     Vision Patient Visual Report: No change from baseline            Cognition Arousal/Alertness: Awake/alert Behavior During Therapy: WFL for tasks assessed/performed Overall Cognitive Status: Within Functional Limits for tasks assessed                                                     Pertinent Vitals/ Pain       Pain Score: 8  Pain Location: Abdomen Pain Descriptors / Indicators: Aching;Discomfort;Throbbing;Guarding Pain Intervention(s): Limited activity within patient's tolerance;Repositioned;Monitored during session         Frequency  Min 2X/week        Progress Toward Goals  OT Goals(current goals can now be found in the care plan section)     Acute Rehab OT Goals Patient Stated Goal: Patient wants to go home  at discharge. Time For Goal Achievement: 05/08/20 Potential to Achieve Goals: Good  Plan Discharge plan remains appropriate;Frequency remains appropriate       AM-PAC OT "6 Clicks" Daily Activity     Outcome Measure   Help from another person eating meals?: A Little Help from another person taking care of personal grooming?: A Little Help from another person toileting, which includes using toliet, bedpan, or urinal?: Total Help from another person bathing (including washing, rinsing, drying)?: A Lot Help from another person to put on and taking off regular upper body clothing?: A Lot Help from another person to put on and taking off regular lower body clothing?: Total 6 Click Score: 12    End of Session Equipment Utilized During Treatment: Oxygen  OT Visit Diagnosis: Unsteadiness on feet (R26.81);Muscle  weakness (generalized) (M62.81);Pain Pain - part of body: (abdomen)   Activity Tolerance Patient limited by pain;Patient limited by fatigue   Patient Left with call bell/phone within reach;with nursing/sitter in room;in bed(sitting edge of bed with RN)   Nurse Communication Mobility status;Patient requests pain meds        Time: 1725-1740 OT Time Calculation (min): 15 min  Charges: OT General Charges $OT Visit: 1 Visit OT Treatments $Self Care/Home Management : 8-22 mins  Kari Baars, [place] [phone] Office- [phone], [place] 05/01/2020, 6:19 PM

## 2020-05-02 LAB — BASIC METABOLIC PANEL
Anion gap: 7 (ref 5–15)
BUN: <5 mg/dL — ABNORMAL LOW (ref 8–23)
CO2: 26 mmol/L (ref 22–32)
Calcium: 7.6 mg/dL — ABNORMAL LOW (ref 8.9–10.3)
Chloride: 101 mmol/L (ref 98–111)
Creatinine, Ser: 0.6 mg/dL (ref 0.44–1.00)
GFR calc Af Amer: >60 mL/min (ref >60)
GFR calc non Af Amer: >60 mL/min (ref >60)
Glucose, Bld: 101 mg/dL — ABNORMAL HIGH (ref 70–99)
Potassium: 3.7 mmol/L (ref 3.5–5.1)
Sodium: 134 mmol/L — ABNORMAL LOW (ref 135–145)

## 2020-05-02 LAB — CBC
HCT: 28.4 % — ABNORMAL LOW (ref 36.0–46.0)
Hemoglobin: 9.1 g/dL — ABNORMAL LOW (ref 12.0–15.0)
MCH: 32.7 pg (ref 26.0–34.0)
MCHC: 32 g/dL (ref 30.0–36.0)
MCV: 102.2 fL — ABNORMAL HIGH (ref 80.0–100.0)
Platelets: 426 10*3/uL — ABNORMAL HIGH (ref 150–400)
RBC: 2.78 MIL/uL — ABNORMAL LOW (ref 3.87–5.11)
RDW: 13.6 % (ref 11.5–15.5)
WBC: 10.8 10*3/uL — ABNORMAL HIGH (ref 4.0–10.5)
nRBC: 0 % (ref 0.0–0.2)

## 2020-05-02 LAB — MAGNESIUM: Magnesium: 1.8 mg/dL (ref 1.7–2.4)

## 2020-05-02 MED ORDER — HYDROMORPHONE HCL 1 MG/ML IJ SOLN: 0.5000 mg | Freq: Four times a day (QID) | INTRAMUSCULAR | Status: DC | PRN

## 2020-05-02 MED ORDER — KETOROLAC TROMETHAMINE 15 MG/ML IJ SOLN
15.0000 mg | Freq: Three times a day (TID) | INTRAMUSCULAR | Status: AC | PRN
  Administered 2020-05-02 – 2020-05-03 (×3): 15 mg via INTRAVENOUS
  Filled 2020-05-02 (×3): qty 1

## 2020-05-02 MED ORDER — KETOROLAC TROMETHAMINE 15 MG/ML IJ SOLN
15.0000 mg | Freq: Once | INTRAMUSCULAR | Status: AC
  Administered 2020-05-02: 15 mg via INTRAVENOUS
  Filled 2020-05-02: qty 1

## 2020-05-02 NOTE — Progress Notes (Signed)
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  Patient was admitted to the hospital for further evaluation and treatment.  Patient failed to improve with medical management, she underwent oratory laparotomy, repair of multiple serosal tears, small bowel resection, lysis of adhesion, application of a wound VAC on 4/24.  Patient subsequently had exploratory laparotomy, closure of the abdomen with ACell gentry X thick 20 x 30 cm, application of wound VAC on 4/24.   OT comments  Pt initially declining to work with OT saying that she could take care of herself. Pt required much encouragement to participate. Pt required Mod A to pivot onto [facility] and then Max A to return to bed and prevent fall due to poor balance, posterior lean and generalized weakness. Attempted to ambulate with pt however pt declined and stated "you're pissing me off". Pt is a high fall risk and will require 24/7 physical assist for all mobility and ADL tasks. Recommend use of wc for mobility around home. Poor safety awareness and insight into how deficits affect her functionally. Impulsive at times. Continue to recommend rehab at [facility].   Follow Up Recommendations  SNF;Supervision/Assistance - 24 hour    Equipment Recommendations  3 in 1 bedside commode;Wheelchair (measurements OT);Wheelchair cushion (measurements OT)    Recommendations for Other Services      Precautions / Restrictions Precautions Precautions: Fall Precaution Comments: wound vac       Mobility Bed Mobility Overal bed mobility: Needs Assistance       Supine to sit: Mod assist     General bed mobility comments: pt  has to loop her hand around therapist's arm to pull to EOB. Declined trying to roll to side adn push up from sidelying  Transfers Overall transfer level: Needs assistance   Transfers: Sit to/from Stand;Stand Pivot Transfers Sit to Stand: Mod assist Stand pivot transfers: Max assist - pt leaning posteriorly and required Max A to prevent fall Pt refused to wear non-slip socks and stated it was her right to refuse. Educated pt that it was hospital policy to wear some type of foot wear when mobilizing, however pt continued to refuse. Pt then saying she had to get to the toilet to urinate and was trying to scoot out end of bed by bed rail.        General transfer comment: unsafe    Balance     Sitting balance-Leahy Scale: Fair       Standing balance-Leahy Scale: Poor                             ADL either performed or assessed with clinical judgement   ADL   Eating/Feeding: Set up   Grooming: Set up;Wash/dry face;Sitting                   Toilet Transfer: Moderate assistance;BSC   Toileting- Clothing Manipulation and Hygiene: Min guard;Sit to/from stand(to clean for urine)       Functional mobility during ADLs: Moderate assistance(stand pivot only) General ADL Comments: Pt initially declined saying that she could take care of herself.     Vision       Perception  Praxis      Cognition Arousal/Alertness: Awake/alert Behavior During Therapy: Restless;Agitated;Anxious;Impulsive Overall Cognitive Status: No family/caregiver present to determine baseline cognitive functioning                                 General Comments: Impaired safety/judgement and awareness of how deficits affect her functionally  Pt apologized for her "rudeness" after session      Exercises     Shoulder Instructions       General Comments      Pertinent Vitals/ Pain       Pain Assessment: Faces Faces Pain Scale: Hurts little more Pain Location:  Abdomen Pain Descriptors / Indicators: Discomfort;Grimacing;Guarding Pain Intervention(s): Limited activity within patient's tolerance  Home Living                                          Prior Functioning/Environment              Frequency  Min 2X/week        Progress Toward Goals  OT Goals(current goals can now be found in the care plan section)  Progress towards OT goals: Progressing toward goals  Acute Rehab OT Goals Patient Stated Goal: to go home tomorrow Time For Goal Achievement: 05/08/20 Potential to Achieve Goals: Good ADL Goals Pt Will Perform Grooming: with set-up;sitting Pt Will Perform Upper Body Bathing: with set-up;sitting Pt Will Perform Lower Body Bathing: with set-up;with min assist;sit to/from stand Pt Will Perform Upper Body Dressing: with set-up;sitting Pt Will Perform Lower Body Dressing: with min assist;sit to/from stand Pt Will Transfer to Toilet: with min assist;bedside commode;ambulating Pt Will Perform Toileting - Clothing Manipulation and hygiene: with min assist;sit to/from stand Pt Will Perform Tub/Shower Transfer: with min assist;tub bench;ambulating  Plan Discharge plan remains appropriate;Frequency remains appropriate    Co-evaluation                 AM-PAC OT "6 Clicks" Daily Activity     Outcome Measure   Help from another person eating meals?: A Little Help from another person taking care of personal grooming?: A Little Help from another person toileting, which includes using toliet, bedpan, or urinal?: A Lot Help from another person bathing (including washing, rinsing, drying)?: A Lot Help from another person to put on and taking off regular upper body clothing?: A Lot Help from another person to put on and taking off regular lower body clothing?: A Lot 6 Click Score: 14    End of Session    OT Visit Diagnosis: Unsteadiness on feet (R26.81);Muscle weakness (generalized) (M62.81);Pain Pain - part  of body: (abdomen)   Activity Tolerance Patient tolerated treatment well   Patient Left in bed;with call bell/phone within reach;with bed alarm set   Nurse Communication Mobility status;Other (comment)(DME needs)        Time: [id number] OT Time Calculation (min): 24 min  Charges: OT General Charges $OT Visit: 1 Visit OT Treatments $Self Care/Home Management : 23-37 mins  Maurie Boettcher, OT/L   Acute OT Clinical Specialist Acute Rehabilitation Services Pager [phone] Office [phone]    [facility] 05/02/2020, 5:19 PM

## 2020-05-02 NOTE — Progress Notes (Signed)
[facility] Progress Note  11 Days Post-Op  Subjective: CC-  Feels the same as yesterday, no better no worse. Has not needed any IV narcotic since 8pm yesterday. Describes the pain as burning. Pain is worse with movement. Still not taking tylenol or robaxin. We discussed trying neurontin but she declined.  Tolerating diet but not eating much. No n/v. BM yesterday. WBC down off antibiotics 10.8, afebrile.  Objective: Vital signs in last 24 hours: Temp:  [98 F (36.7 C)-98.5 F (36.9 C)] 98.2 F (36.8 C) (05/04 0447) Pulse Rate:  [84-94] 84 (05/04 0447) Resp:  [18-20] 20 (05/04 0447) BP: (107-117)/(61-92) 108/61 (05/04 0447) SpO2:  [92 %-96 %] 95 % (05/04 0447) Weight:  [48.6 kg-49.9 kg] 48.6 kg (05/04 0447) Last BM Date: 04/30/20  Intake/Output from previous day: 05/03 0701 - 05/04 0700 In: 91 [P.O.:840; IV Piggyback:50] Out: 400 [Urine:400] Intake/Output this shift: Total I/O In: 110 [P.O.:100; I.V.:10] Out: 0   PE: Gen:  Alert, NAD, pleasant HEENT: EOM's intact, pupils equal and round Pulm:  rate and effort normal Abd: soft, mild diffuse tenderness, +BS, vac to midline incision with good seal (picture of wound in Epic from yesterday)  Lab Results:  Recent Labs    05/01/20 0353 05/02/20 0823  WBC 11.1* 10.8*  HGB 8.7* 9.1*  HCT 27.2* 28.4*  PLT 364 426*   BMET Recent Labs    05/01/20 0353 05/02/20 0823  NA 134* 134*  K 3.1* 3.7  CL 102 101  CO2 24 26  GLUCOSE 105* 101*  BUN <5* <5*  CREATININE 0.54 0.60  CALCIUM 7.6* 7.6*   PT/INR No results for input(s): LABPROT, INR in the last 72 hours. CMP     Component Value Date/Time   NA 134 (L) 05/02/2020 0823   NA 141 01/15/2018 0000   NA 138 08/07/2017 0835   K 3.7 05/02/2020 0823   K 4.1 08/07/2017 0835   CL 101 05/02/2020 0823   CO2 26 05/02/2020 0823   CO2 25 08/07/2017 0835   GLUCOSE 101 (H) 05/02/2020 0823   GLUCOSE 95 08/07/2017 0835   BUN <5 (L) 05/02/2020 0823   BUN 4  01/15/2018 0000   BUN 9.3 08/07/2017 0835   CREATININE 0.60 05/02/2020 0823   CREATININE 0.80 03/16/2020 1002   CREATININE 0.57 02/05/2018 1144   CREATININE 0.7 08/07/2017 0835   CALCIUM 7.6 (L) 05/02/2020 0823   CALCIUM 9.4 08/07/2017 0835   PROT 6.2 (L) 04/27/2020 0420   PROT 7.3 08/07/2017 0835   ALBUMIN 2.1 (L) 04/27/2020 0420   ALBUMIN 3.4 (L) 08/07/2017 0835   AST 12 (L) 04/27/2020 0420   AST 13 (L) 03/16/2020 1002   AST 25 08/07/2017 0835   ALT 10 04/27/2020 0420   ALT 11 03/16/2020 1002   ALT 21 08/07/2017 0835   ALKPHOS 98 04/27/2020 0420   ALKPHOS 138 08/07/2017 0835   BILITOT 0.3 04/27/2020 0420   BILITOT 0.4 03/16/2020 1002   BILITOT 0.49 08/07/2017 0835   GFRNONAA >60 05/02/2020 0823   GFRNONAA >60 03/16/2020 1002   GFRAA >60 05/02/2020 0823   GFRAA >60 03/16/2020 1002   Lipase     Component Value Date/Time   LIPASE 59 (H) 04/11/2020 1542       Studies/Results: No results found.  Anti-infectives: Anti-infectives (From admission, onward)   Start     Dose/Rate Route Frequency Ordered Stop   04/20/20 1000  ciprofloxacin (CIPRO) IVPB 400 mg  Status:  Discontinued  400 mg 200 mL/hr over 60 Minutes Intravenous Every 12 hours 04/20/20 0856 05/01/20 1013   04/20/20 1000  metroNIDAZOLE (FLAGYL) IVPB 500 mg  Status:  Discontinued     500 mg 100 mL/hr over 60 Minutes Intravenous Every 8 hours 04/20/20 0856 05/01/20 1013   04/19/20 0600  ciprofloxacin (CIPRO) IVPB 400 mg     400 mg 200 mL/hr over 60 Minutes Intravenous On call to O.R. 04/18/20 1449 04/19/20 1200       Assessment/Plan Chronic tobacco use Abdominal wall thrombus-on Xarelto Hx EtOH abuse AKI Malnutrition - prealbumin7.9 (4/26), off TNA and tolerating soft diet  Metastatic colon cancerwith right hemicolectomy 2016 - ongoing chemotherapy-Dr. Benay Spice - Excision of malignant mass from the abdominal including skin, muscle, fascia and peritoneum(15 x 12cm), abdominal reconstruction  with [name], 10/03/17 Dr. Barry Dienes and [name]  POD#13/11-s/p ex lap with SBR for contained perforation fromSBOand takeback for abdominal wall closure, Dr. Marlou Starks - [facility] changeson MWF - tolerating diet and having bowel function. I do think that she will eat a little better once she gets home - WBC continues to downtrend off antibiotics 10.8, afebrile - Encouraged patient to take the nonnarcotic medications, she will think about it. Consider adding neurontin but patient declines. Refusing SNF and is planning discharge home with [facility] therapies. [place] for discharge later today vs tomorrow from surgical standpoint if pain better under control. D/c dilaudid. Discharge instructions and follow up info on AVS  ID - cipro/flagyl 4/22>>5/3 FEN - reg diet VTE - xarelto Foley - none Follow up - Dr. Marlou Starks, oncology    LOS: 21 days    [place], [facility] 05/02/2020, 10:14 AM Please see Amion for pager number during day hours 7:00am-4:30pm

## 2020-05-02 NOTE — Progress Notes (Addendum)
PROGRESS NOTE    Yolanda Watson    Code Status: DNR  [id number] DOB: August 15, 1951 DOA: 04/11/2020 LOS: 21 days  PCP: Patient, No Pcp Per CC:  Chief Complaint  Patient presents with  . Abdominal Pain  . Constipation  . 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  General surgery was subsequently consulted and patient underwent exploratory laparotomy with small bowel resection with Dr. Marlou Starks (4/21) and closure of abdomen with application of wound VAC on 4/23  Has been prolonged due to elevated white count as well as significant abdominal pain requiring IV pain meds and transitioning to p.o. has been difficult but improving.   A & P   Principal Problem:   SBO (small bowel obstruction) (HCC) Active Problems:   Abdominal pain   Nausea & vomiting   Acute hyponatremia   AKI (acute kidney injury) ([place])   Leukocytosis   1. SBO with perforation s/p exploratory laparotomy with small bowel resection with Dr. Marlou Starks (4/21) and closure of abdomen with application of wound VAC on 4/23  a. Completed 11 days of Cipro/Flagyl, can discontinue at this point his white count is nearly normalized and she is afebrile b. Off TPN, tolerating p.o. intake c. Wound VAC changes MWF d. Unfortunately reporting severe pain today.  She was really hoping to go home today but truly does not feel she can with this level of pain and still refusing SNF. Because of this I am concerned she is going to bounce back within a week if this does not get under better control.  I had a discussion regarding pain expectations with her and she is hoping that she can be discharged early tomorrow.  She does not want to use the IV opiates but has been requiring occasionally. e. I  agree with discontinuing Dilaudid by surgery f. will give a trial of Toradol today for severe pain and if this helps then would recommend discharging tomorrow with a course of NSAIDs since she is likely having a component of postsurgical inflammatory pain but would obviously be  cautious with prolonged amounts of NSAIDs g. Continue other p.o. pain regimen  2. Leukocytosis, likely reactive to pain/surgery and nearly resolved  3. Debility a. PT recommending SNF however patient refusing b. Patient to go home with home health likely tomorrow  4. Coffee-ground fluid from NG tube, appears resolved a. Protonix PO  5. Acute hypoxic respiratory failure, likely from postop atelectasis and opiates, resolved  6. AKI resolved with IV fluids  7. Metabolic alkalosis secondary to GI losses, resolved  8. Hyperkalemia resolved  9. Hypokalemia resolved  10. Hypomagnesemia, replete   11. Metastatic colon cancer follows with Dr. Benay Spice outpatient.  Has right chest port  12. Asymptomatic pancreatic duct dilation a. Noted on CT scan b. Outpatient follow-up  13. 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 Hyponatremia Stable  DVT prophylaxis: Xarelto Family Communication: Patient and husband at bedside updated.  Patient will need transportation assistance at discharge Disposition Plan:  Status is: Inpatient  Remains inpatient appropriate because:Ongoing active pain requiring inpatient pain management   Dispo: The patient is from: Home              Anticipated d/c is to: Home  Anticipated d/c date is: 1 days              Patient currently is not medically stable to d/c.  Plan for discharge tomorrow pending pain control  Pressure injury documentation    None  Consultants  General surgery   Procedures  s/p exploratory laparotomy with small bowel resection with Dr. Marlou Starks (4/21) and closure of abdomen with application of wound VAC on 4/23  Antibiotics    Anti-infectives (From admission, onward)   Start     Dose/Rate Route Frequency Ordered Stop   04/20/20 1000  ciprofloxacin (CIPRO) IVPB 400 mg  Status:  Discontinued     400 mg 200 mL/hr over 60 Minutes Intravenous Every 12 hours 04/20/20 0856 05/01/20 1013   04/20/20 1000  metroNIDAZOLE (FLAGYL) IVPB 500 mg  Status:  Discontinued     500 mg 100 mL/hr over 60 Minutes Intravenous Every 8 hours 04/20/20 0856 05/01/20 1013   04/19/20 0600  ciprofloxacin (CIPRO) IVPB 400 mg     400 mg 200 mL/hr over 60 Minutes Intravenous On call to O.R. 04/18/20 1449 04/19/20 1200        Subjective   She is very frustrated today as she was truly hoping to go home but states that her pain was very severe today and does not feel she can manage this at home.  She is hoping she can be discharged tomorrow if this can get under better control.  This is the same pain she has been complaining of.  Denies any constipation, chest pain, nausea or vomiting.  She is trying to ambulate as well.   Objective   Vitals:   05/01/20 1607 05/01/20 2141 05/02/20 0447 05/02/20 1325  BP:  (!) 107/92 108/61 105/64  Pulse:  94 84 85  Resp:  18 20 17   Temp:  98.5 F (36.9 C) 98.2 F (36.8 C) 98.2 F (36.8 C)  TempSrc:  Oral Oral Oral  SpO2:  96% 95% 97%  Weight: 49.9 kg  48.6 kg   Height:        Intake/Output Summary (Last 24 hours) at 05/02/2020 1454 Last data filed at 05/02/2020 1235 Gross per 24 hour  Intake 640 ml  Output 400 ml  Net 240 ml   Filed Weights   05/01/20 0500 05/01/20 1607 05/02/20 0447  Weight: 51.9 kg 49.9 kg 48.6 kg    Examination:  Physical Exam Vitals and nursing note reviewed.  Constitutional:      Appearance: Normal appearance.  HENT:     Head: Normocephalic and atraumatic.  Eyes:     Conjunctiva/sclera: Conjunctivae normal.  Cardiovascular:     Rate and Rhythm: Normal rate and regular rhythm.  Pulmonary:     Effort: Pulmonary effort is normal.     Breath sounds: Normal breath  sounds.  Abdominal:     Tenderness: There is abdominal tenderness.  Musculoskeletal:        General: No swelling or tenderness.  Skin:    Coloration: Skin is not jaundiced or pale.  Neurological:     Mental Status: She is alert. Mental status is at baseline.  Psychiatric:        Mood and Affect: Mood normal.        Behavior: Behavior normal.     Data Reviewed: I have personally reviewed following labs and imaging studies  CBC: Recent Labs  Lab 04/28/20 0335 04/29/20 0351 04/30/20 0402 05/01/20 0353 05/02/20 0823  WBC 19.7* 17.2* 13.1* 11.1* 10.8*  HGB 9.9*  9.5* 9.1* 8.7* 9.1*  HCT 30.3* 29.5* 28.7* 27.2* 28.4*  MCV 102.0* 102.1* 102.5* 101.9* 102.2*  PLT 233 301 333 364 [id number]*   Basic Metabolic Panel: Recent Labs  Lab 04/26/20 0351 04/26/20 0351 04/27/20 0420 04/27/20 0420 04/28/20 0335 04/29/20 0351 04/30/20 0402 05/01/20 0353 05/02/20 0406 05/02/20 0823  NA 133*   < > 131*   < > 133* 133* 133* 134*  --  134*  K 4.6   < > 4.6   < > 4.0 3.9 3.6 3.1*  --  3.7  CL 103   < > 101   < > 103 102 103 102  --  101  CO2 24   < > 26   < > 23 23 26 24   --  26  GLUCOSE 139*   < > 143*   < > 104* 105* 98 105*  --  101*  BUN 20   < > 24*   < > 17 11 8  <5*  --  <5*  CREATININE 0.54   < > 0.60   < > 0.59 0.56 0.56 0.54  --  0.60  CALCIUM 7.9*   < > 7.7*   < > 7.7* 7.8* 7.6* 7.6*  --  7.6*  MG 1.8   < > 1.8   < > 1.8 1.6* 1.7 1.4* 1.8  --   PHOS 3.7  --  4.1  --   --   --   --   --   --   --    < > = values in this interval not displayed.   GFR: Estimated Creatinine Clearance: 52.4 mL/min (by C-G formula based on SCr of 0.6 mg/dL). Liver Function Tests: Recent Labs  Lab 04/26/20 0351 04/27/20 0420  AST  --  12*  ALT  --  10  ALKPHOS  --  98  BILITOT  --  0.3  PROT  --  6.2*  ALBUMIN 2.3* 2.1*   No results for input(s): LIPASE, AMYLASE in the last 168 hours. No results for input(s): AMMONIA in the last 168 hours. Coagulation Profile: No results for input(s): INR,  PROTIME in the last 168 hours. Cardiac Enzymes: No results for input(s): CKTOTAL, CKMB, CKMBINDEX, TROPONINI in the last 168 hours. BNP (last 3 results) No results for input(s): PROBNP in the last 8760 hours. HbA1C: No results for input(s): HGBA1C in the last 72 hours. CBG: Recent Labs  Lab 04/26/20 1142 04/26/20 1628 04/26/20 2344 04/27/20 0805 04/27/20 1141  GLUCAP 127* 107* 138* 120* 107*   Lipid Profile: No results for input(s): CHOL, HDL, LDLCALC, TRIG, CHOLHDL, LDLDIRECT in the last 72 hours. Thyroid Function Tests: No results for input(s): TSH, T4TOTAL, FREET4, T3FREE, THYROIDAB in the last 72 hours. Anemia Panel: No results for input(s): VITAMINB12, FOLATE, FERRITIN, TIBC, IRON, RETICCTPCT in the last 72 hours. Sepsis Labs: No results for input(s): PROCALCITON, LATICACIDVEN in the last 168 hours.  No results found for this or any previous visit (from the past 240 hour(s)).       Radiology Studies: No results found.      Scheduled Meds: . acetaminophen  1,000 mg Oral Q6H  . Chlorhexidine Gluconate Cloth  6 each Topical Daily  . feeding supplement  1 Container Oral Q24H  . feeding supplement (ENSURE ENLIVE)  237 mL Oral Q24H  . mouth rinse  15 mL Mouth Rinse BID  . methocarbamol  500 mg Oral TID  . pantoprazole  40 mg Oral BID  . potassium chloride  40 mEq Oral Once  . rivaroxaban  20 mg Oral Daily  . sodium chloride flush  10-40 mL Intracatheter Q12H  . sodium chloride flush  3 mL Intravenous Q12H   Continuous Infusions: . sodium chloride 250 mL (04/30/20 1738)     Time spent 25 minutes with over 50% of the time coordinating the patient's care    Harold Hedge, DO [facility] Pager [phone]  Call night coverage person covering after 7pm

## 2020-05-03 LAB — MAGNESIUM: Magnesium: 1.6 mg/dL — ABNORMAL LOW (ref 1.7–2.4)

## 2020-05-03 MED ORDER — MAGNESIUM SULFATE 2 GM/50ML IV SOLN
2.0000 g | Freq: Once | INTRAVENOUS | Status: AC
  Administered 2020-05-03: 2 g via INTRAVENOUS
  Filled 2020-05-03: qty 50

## 2020-05-03 NOTE — Progress Notes (Signed)
[facility] Progress Note  12 Days Post-Op  Subjective: CC-  Still complaining of abdominal pain, but states that NSAID did help burning pain. Off IV dilaudid. Denies n/v. Passing flatus, last BM 2 days ago. Tolerating diet but not eating much. Main complaint is weakness and concern about mobilizing. Does not feel like she would be able to go home because of this. We again discussed SNF and she states that she is now open to considering.  Objective: Vital signs in last 24 hours: Temp:  [97.6 F (36.4 C)-98.5 F (36.9 C)] 98.5 F (36.9 C) (05/05 0550) Pulse Rate:  [74-85] 82 (05/05 0550) Resp:  [17-18] 18 (05/05 0550) BP: (101-111)/(55-71) 111/71 (05/05 0550) SpO2:  [96 %-97 %] 97 % (05/05 0550) Weight:  [48.5 kg] 48.5 kg (05/05 0550) Last BM Date: 04/30/20  Intake/Output from previous day: 05/04 0701 - 05/05 0700 In: 910 [P.O.:820; I.V.:90] Out: 401 [Urine:401] Intake/Output this shift: No intake/output data recorded.  PE: Gen: Alert, NAD, pleasant HEENT: EOM's intact, pupils equal and round Pulm: rate and effort normal [id number], mild diffuse tenderness, +BS, wound seen below/ serous fluid in wound vac cannister     Lab Results:  Recent Labs    05/01/20 0353 05/02/20 0823  WBC 11.1* 10.8*  HGB 8.7* 9.1*  HCT 27.2* 28.4*  PLT 364 426*   BMET Recent Labs    05/01/20 0353 05/02/20 0823  NA 134* 134*  K 3.1* 3.7  CL 102 101  CO2 24 26  GLUCOSE 105* 101*  BUN <5* <5*  CREATININE 0.54 0.60  CALCIUM 7.6* 7.6*   PT/INR No results for input(s): LABPROT, INR in the last 72 hours. CMP     Component Value Date/Time   NA 134 (L) 05/02/2020 0823   NA 141 01/15/2018 0000   NA 138 08/07/2017 0835   K 3.7 05/02/2020 0823   K 4.1 08/07/2017 0835   CL 101 05/02/2020 0823   CO2 26 05/02/2020 0823   CO2 25 08/07/2017 0835   GLUCOSE 101 (H) 05/02/2020 0823   GLUCOSE 95 08/07/2017 0835   BUN <5 (L) 05/02/2020 0823   BUN 4 01/15/2018 0000   BUN  9.3 08/07/2017 0835   CREATININE 0.60 05/02/2020 0823   CREATININE 0.80 03/16/2020 1002   CREATININE 0.57 02/05/2018 1144   CREATININE 0.7 08/07/2017 0835   CALCIUM 7.6 (L) 05/02/2020 0823   CALCIUM 9.4 08/07/2017 0835   PROT 6.2 (L) 04/27/2020 0420   PROT 7.3 08/07/2017 0835   ALBUMIN 2.1 (L) 04/27/2020 0420   ALBUMIN 3.4 (L) 08/07/2017 0835   AST 12 (L) 04/27/2020 0420   AST 13 (L) 03/16/2020 1002   AST 25 08/07/2017 0835   ALT 10 04/27/2020 0420   ALT 11 03/16/2020 1002   ALT 21 08/07/2017 0835   ALKPHOS 98 04/27/2020 0420   ALKPHOS 138 08/07/2017 0835   BILITOT 0.3 04/27/2020 0420   BILITOT 0.4 03/16/2020 1002   BILITOT 0.49 08/07/2017 0835   GFRNONAA >60 05/02/2020 0823   GFRNONAA >60 03/16/2020 1002   GFRAA >60 05/02/2020 0823   GFRAA >60 03/16/2020 1002   Lipase     Component Value Date/Time   LIPASE 59 (H) 04/11/2020 1542       Studies/Results: No results found.  Anti-infectives: Anti-infectives (From admission, onward)   Start     Dose/Rate Route Frequency Ordered Stop   04/20/20 1000  ciprofloxacin (CIPRO) IVPB 400 mg  Status:  Discontinued     400 mg 200  mL/hr over 60 Minutes Intravenous Every 12 hours 04/20/20 0856 05/01/20 1013   04/20/20 1000  metroNIDAZOLE (FLAGYL) IVPB 500 mg  Status:  Discontinued     500 mg 100 mL/hr over 60 Minutes Intravenous Every 8 hours 04/20/20 0856 05/01/20 1013   04/19/20 0600  ciprofloxacin (CIPRO) IVPB 400 mg     400 mg 200 mL/hr over 60 Minutes Intravenous On call to O.R. 04/18/20 1449 04/19/20 1200       Assessment/Plan Chronic tobacco use Abdominal wall thrombus-on Xarelto Hx EtOH abuse AKI Malnutrition - prealbumin7.9(4/26), off TNA and tolerating soft diet  Metastatic colon cancerwith right hemicolectomy 2016 - ongoing chemotherapy-Dr. Benay Spice - Excision of malignant mass from the abdominal including skin, muscle, fascia and peritoneum(15 x 12cm), abdominal reconstruction with [name], 10/03/17  Dr. Barry Dienes and [name]  POD#14/12-s/p ex lap with SBR for contained perforation fromSBOand takeback for abdominal wall closure, Dr. Marlou Starks - [facility] changeson MWF - tolerating diet and having bowel function but still has poor PO intake - afebrile, off antibiotics - continue dulcolax suppository PRN constipation - Patient is now open to considering SNF, which I think is the best decision for her. Will discuss this with primary team and see if we can start this search.  ID -cipro/flagyl 4/22>>5/3 FEN -reg diet VTE -xarelto Foley -none Follow up -Dr. Marlou Starks, oncology     LOS: 22 days    Wellington Hampshire, [facility] 05/03/2020, 9:28 AM Please see Amion for pager number during day hours 7:00am-4:30pm

## 2020-05-03 NOTE — TOC Progression Note (Addendum)
Transition of Care [facility]) - Progression Note    Patient Details  Name: Yolanda Watson MRN: [id number] Date of Birth: [birth year]  [facility]) CM/SW [place], LCSW Phone Number: 05/03/2020, 11:45 AM  Clinical Narrative:    Patient agreeable to SNF placement. Patient gave CSW permission to send referral to SNF's in the area. CSW will follow up with bed offers.  FL2 complete.    CSW followed up with bed offers. Patient reports, " after working with therapy today I feel like I can go home." CSW explained the physician/PT/PA recommendation for rehab at SNF Patient has requested to speak with her spouse about returning home. Patient gave CSW permission to choose a SNF, as a back up plan.  CSW reached out to [facility].   CSW notified [facility] not pick up the wound vac today.   Expected Discharge Plan: [place] Services Barriers to Discharge: Other (comment)(Covid test)  Expected Discharge Plan and Services Expected Discharge Plan: [place]   Discharge Planning Services: CM Consult Post Acute Care Choice: [place] arrangements for the past 2 months: Single Family Home                 DME Arranged: Vac DME Agency: [facility] Date DME Agency Contacted: 04/27/20 Time DME Agency Contacted: [phone] Representative spoke with at DME Agency: Olivia Mackie HH Arranged: RN, PT, OT, Nurse's Aide, Social Work [employer] Agency: [facility] Date [place]: 04/25/20 Time [place]: 1000 Representative spoke with at [place]: amy   Social Determinants of Health ([place]) Interventions    Readmission Risk Interventions No flowsheet data found.

## 2020-05-03 NOTE — NC FL2 (Addendum)
[place] MEDICAID FL2 LEVEL OF CARE SCREENING TOOL     IDENTIFICATION  Patient Name: Yolanda Watson Birthdate: 14-Feb-1951 Sex: female Admission Date (Current Location): 04/11/2020  [facility] and [place] Number:  Herbalist and Address:  [facility],  [place], [place]      Provider Number:    Attending Physician Name and Address:  Tawni Millers,*  Relative Name and Phone Number:     Timmy, Delahoussaye   [phone]       Current Level of Care: Hospital Recommended Level of Care: [place] Prior Approval Number:    Date Approved/Denied:   PASRR Number:  [id number]  Discharge Plan: SNF    Current Diagnoses: Patient Active Problem List   Diagnosis Date Noted  . Abdominal pain 04/12/2020  . Nausea & vomiting 04/12/2020  . Acute hyponatremia 04/12/2020  . AKI (acute kidney injury) ([place]) 04/12/2020  . Leukocytosis 04/12/2020  . SBO (small bowel obstruction) ([place]) 04/11/2020  . Metastatic colon cancer to liver ([place]) 06/17/2019  . Goals of care, counseling/discussion 06/17/2019  . At risk for adverse drug event 01/13/2018  . Anemia, unspecified 01/11/2018  . Abdominal pain, lower   . Encounter for palliative care   . Abdominal wall abscess at site of surgical wound   . Acute osteomyelitis of pelvic region ([place])   . Open wound of abdomen 11/14/2017  . Port-A-Cath in place 10/21/2017  . Local recurrence of colon cancer ([place]) 10/03/2017  . Secondary malignancy of inguinal lymph nodes ([place]) 07/29/2017  . Cancer of right colon ([place]) 05/05/2017    Orientation RESPIRATION BLADDER Height & Weight     Self, Time, Situation, Place  Normal Continent Weight: 106 lb 14.8 oz (48.5 kg) Height:  5\' 6"  (167.6 cm)  BEHAVIORAL SYMPTOMS/MOOD NEUROLOGICAL BOWEL NUTRITION STATUS      Continent Diet(Regular)  AMBULATORY STATUS COMMUNICATION OF NEEDS Skin   Extensive Assist Verbally Wound Vac   Midline abdominal  wound, VAC therapy. ACELL in place in distal segment.  Wound type:surgical Pressure Injury POA:NA Measurement:16cm x 3.6cm x 1 cm with visible ACELL,  Wound [id number] pink nongranulating Periwound:intact abdomen Dressing procedure/placement/frequency:CLeanse abdominal wound with NS. Apply nonadherent contact layer to ACELL (distal segment) Black foam to proximal end wound bed and over contact layer. Cover with drape.  Change Mon/Wed/Fri                    Personal Care Assistance Level of Assistance  Bathing, Dressing, Feeding Bathing Assistance: Maximum assistance Feeding assistance: Independent Dressing Assistance: Maximum assistance     Functional Limitations Info  Sight, Hearing, Speech Sight Info: Adequate Hearing Info: Adequate      SPECIAL CARE FACTORS FREQUENCY  PT (By licensed PT), OT (By licensed OT)     PT Frequency: 5x/week OT Frequency: 5x/week            Contractures Contractures Info: Not present    Additional Factors Info  Code Status, Allergies, Psychotropic Code Status Info: DNR Allergies Info: Allergies: Penicillins, Latex           Current Medications (05/03/2020):  This is the current hospital active medication list Current Facility-Administered Medications  Medication Dose Route Frequency Provider Last Rate Last Admin  . 0.9 %  sodium chloride infusion   Intravenous PRN Harold Hedge, MD 10 mL/hr at 04/30/20 1738 250 mL at 04/30/20 1738  . acetaminophen (TYLENOL) tablet 1,000 mg  1,000 mg Oral Q6H Saverio Danker, PA-C  1,000 mg at 05/03/20 0907  . bisacodyl (DULCOLAX) suppository 10 mg  10 mg Rectal Daily PRN Regalado, Belkys A, MD   10 mg at 04/30/20 1405  . Chlorhexidine Gluconate Cloth 2 % PADS 6 each  6 each Topical Daily Regalado, Belkys A, MD   6 each at 05/03/20 0907  . feeding supplement (BOOST / RESOURCE BREEZE) liquid 1 Container  1 Container Oral Q24H Harold Hedge, MD   Stopped at 05/02/20 1000  . feeding  supplement (ENSURE ENLIVE) (ENSURE ENLIVE) liquid 237 mL  237 mL Oral Q24H Marva Panda E, MD      . hydrALAZINE (APRESOLINE) injection 10 mg  10 mg Intravenous Q6H PRN Regalado, Belkys A, MD   10 mg at 04/22/20 1931  . ketorolac (TORADOL) 15 MG/ML injection 15 mg  15 mg Intravenous Q8H PRN Harold Hedge, MD   15 mg at 05/03/20 0641  . magnesium sulfate IVPB 2 g 50 mL  2 g Intravenous Once Tawni Millers, MD 50 mL/hr at 05/03/20 1129 2 g at 05/03/20 1129  . MEDLINE mouth rinse  15 mL Mouth Rinse BID Regalado, Belkys A, MD   15 mL at 05/03/20 0907  . methocarbamol (ROBAXIN) tablet 500 mg  500 mg Oral TID Saverio Danker, PA-C   500 mg at 05/02/20 1638  . naloxone (NARCAN) injection 0.4 mg  0.4 mg Intravenous PRN Regalado, Belkys A, MD      . nicotine (NICODERM CQ - dosed in mg/24 hours) patch 14 mg  14 mg Transdermal Daily PRN Regalado, Belkys A, MD      . ondansetron (ZOFRAN) injection 4 mg  4 mg Intravenous Q6H PRN Regalado, Belkys A, MD   4 mg at 04/17/20 0446  . oxyCODONE (Oxy IR/ROXICODONE) immediate release tablet 5-10 mg  5-10 mg Oral Q4H PRN Meuth, Brooke A, PA-C   10 mg at 05/03/20 0909  . pantoprazole (PROTONIX) EC tablet 40 mg  40 mg Oral BID Harold Hedge, MD   40 mg at 05/03/20 [id number]  . phenol (CHLORASEPTIC) mouth spray 1 spray  1 spray Mouth/Throat PRN Regalado, Belkys A, MD   1 spray at 04/15/20 1118  . potassium chloride SA (KLOR-CON) CR tablet 40 mEq  40 mEq Oral Once Harold Hedge, MD      . promethazine (PHENERGAN) injection 12.5 mg  12.5 mg Intravenous Q6H PRN Regalado, Belkys A, MD   12.5 mg at 04/21/20 1628  . rivaroxaban (XARELTO) tablet 20 mg  20 mg Oral Daily Regalado, Belkys A, MD   20 mg at 05/02/20 1638  . sodium chloride flush (NS) 0.9 % injection 10-40 mL  10-40 mL Intracatheter PRN Regalado, Belkys A, MD   10 mL at 04/28/20 0336  . sodium chloride flush (NS) 0.9 % injection 10-40 mL  10-40 mL Intracatheter Q12H Regalado, Belkys A, MD   10 mL at 05/02/20 0852   . sodium chloride flush (NS) 0.9 % injection 3 mL  3 mL Intravenous Q12H Regalado, Belkys A, MD   3 mL at 05/02/20 2102     Discharge Medications: Please see discharge summary for a list of discharge medications.  Relevant Imaging Results:  Relevant Lab Results:   Additional Information SS# [place], [place]

## 2020-05-03 NOTE — Progress Notes (Signed)
Physical Therapy Treatment Patient Details Name: Yolanda Watson MRN: [id number] DOB: April 04, 1951 Today's Date: 05/03/2020    History of Present Illness 69 year old with past medical history significant for metastatic colon cancer on chemotherapy, chronic tobacco abuse, abdominal wall thrombosis on Xarelto presented to the hospital with complaints of abdominal pain and found to have an SBO.  Patient was admitted to the hospital for further evaluation and treatment.  Patient failed to improve with medical management, she underwent oratory laparotomy, repair of multiple serosal tears, small bowel resection, lysis of adhesion, application of a wound VAC on 4/24.  Patient subsequently had exploratory laparotomy, closure of the abdomen with ACell gentry X thick 20 x 30 cm, application of wound VAC on 4/24.    PT Comments    Pt initially refuses therapy due to wanting to rest and is now agreeing to SNF to get her strength back. PT educated pt on benefit of exercises, getting out of bed, ambulation and pt agreeable to transfer to bedside chair in order to eat lunch. Pt impulsive with mobility, requiring cues to roll to side before rising to sitting but insistent on rocking up with min assist from therapist. Pt impulsive with STS and pivoting over to bedside chair, therapist positioned in front of pt cueing her for safety due to IV and wound vac lines. Pt denies increase in pain with mobility this date, but refuses therapeutic exercises or gait due to plans on going to SNF to regain strength. Patient will benefit from continued physical therapy in hospital and recommendations below to increase strength, balance, endurance for safe ADLs and gait.   Follow Up Recommendations  SNF;Supervision/Assistance - 24 hour     Equipment Recommendations  Rolling walker with 5" wheels    Recommendations for Other Services       Precautions / Restrictions Precautions Precautions: Fall Precaution Comments: wound  vac Restrictions Weight Bearing Restrictions: No    Mobility  Bed Mobility Overal bed mobility: Needs Assistance Bed Mobility: Sidelying to Sit  Supine to sit: Min assist  General bed mobility comments: pt attempts to sit straight up, therapist educates pt on log rolling to side then pushing up through BUE to upright trunk but pt declines; rocking momentum and assist from therapist to upright trunk fully  Transfers Overall transfer level: Needs assistance Equipment used: 1 person hand held assist Transfers: Sit to/from Stand;Stand Pivot Transfers Sit to Stand: Min assist Stand pivot transfers: Min assist  General transfer comment: therapist positioned in front of pt; min assist to power up from EOB and min assist steadying with pivoting over to chair, very impulsive requiring reminders of IV lines and wound vac line to decrease risk for falls  Ambulation/Gait  General Gait Details: pt refused "I'm going to go to rehab to get my strength back"   Stairs             Wheelchair Mobility    Modified Rankin (Stroke Patients Only)       Balance Overall balance assessment: Needs assistance Sitting-balance support: Feet supported;Bilateral upper extremity supported Sitting balance-Leahy Scale: Fair Sitting balance - Comments: seated EOB   Standing balance support: During functional activity;Bilateral upper extremity supported Standing balance-Leahy Scale: Poor Standing balance comment: HHA from therapist           Cognition Arousal/Alertness: Awake/alert Behavior During Therapy: Restless;Impulsive Overall Cognitive Status: No family/caregiver present to determine baseline cognitive functioning  General Comments: impulsive with mobility and little attention to mobility cues to decrease pain and  improve balance      Exercises      General Comments        Pertinent Vitals/Pain Pain Assessment: Faces Faces Pain Scale: Hurts a little bit Pain Location:  Abdomen Pain Descriptors / Indicators: Discomfort;Grimacing Pain Intervention(s): Limited activity within patient's tolerance;Monitored during session;Premedicated before session    Home Living                 Prior Function            PT Goals (current goals can now be found in the care plan section) Acute Rehab PT Goals Patient Stated Goal: SNF to get stronger PT Goal Formulation: With patient Time For Goal Achievement: 05/10/20 Potential to Achieve Goals: Fair Progress towards PT goals: Progressing toward goals    Frequency    Min 3X/week      PT Plan Current plan remains appropriate    Co-evaluation              AM-PAC PT "6 Clicks" Mobility   Outcome Measure  Help needed turning from your back to your side while in a flat bed without using bedrails?: A Little Help needed moving from lying on your back to sitting on the side of a flat bed without using bedrails?: A Little Help needed moving to and from a bed to a chair (including a wheelchair)?: A Lot Help needed standing up from a chair using your arms (e.g., wheelchair or bedside chair)?: A Lot Help needed to walk in hospital room?: A Lot Help needed climbing 3-5 steps with a railing? : Total 6 Click Score: 13    End of Session   Activity Tolerance: Patient limited by pain Patient left: in chair;with call bell/phone within reach;with nursing/sitter in room Nurse Communication: Mobility status PT Visit Diagnosis: Unsteadiness on feet (R26.81);Muscle weakness (generalized) (M62.81);Difficulty in walking, not elsewhere classified (R26.2);Pain     Time: 1213-1226 PT Time Calculation (min) (ACUTE ONLY): 13 min  Charges:  $Therapeutic Activity: 8-22 mins                      Talbot Grumbling PT, DPT 05/03/20, 12:38 PM [phone]

## 2020-05-03 NOTE — Consult Note (Addendum)
[place] Nurse wound follow up Midline abdominal wound, VAC therapy. ACELL in place in distal segment.  Surgery at bedside to assess Wound type:surgical Pressure Injury POA:NA Measurement:16 cm x 3.6 cm x 1 cm with visible ACELL,  Wound [id number] pink nongranulating Drainage (amount, consistency, odor)Heavy serous drainage noted today and in canisterNo odor.  Periwound:intact abdomen Dressing procedure/placement/frequency:CLeanse abdominal wound with NS. Apply nonadherent contact layer to ACELL (distal segment) Black foam to proximal end wound bed and over contact layer. Cover with drape. Seal immediately achieved.  Change Mon/Wed/Fri WOC team will follow. Domenic Moras MSN, RN, FNP-BC CWON Wound, Ostomy, Continence Nurse Pager [phone]

## 2020-05-03 NOTE — Progress Notes (Signed)
PROGRESS NOTE    [name]  [id number] DOB: 12-14-1951 DOA: 04/11/2020 PCP: Yolanda Watson, No Pcp Per    Brief Narrative:  Yolanda Watson admitted with the working diagnosis of small bowel obstruction with perforation, now status post exploratory laparotomy resection 0000000, application of wound VAC 71/9.   69 year old female who presented with abdominal pain.  She does have the significant past medical history for metastatic colon cancer, tobacco abuse, and abdominal wall thrombosis.  She reported 4 days of general abdominal discomfort, constant, worse with palpation, associated with constipation and decreased flatulence.  Positive nausea and vomiting.  On her initial physical examination temperature 97.4, heart rate 78, blood pressure 117/74, respiratory rate 16, oxygen saturation 96%.  She had a dry mucous membranes, her lungs are clear to auscultation bilaterally, heart S1-S2, present and rhythmic, abdomen with decreased bowel sounds, positive distention and tenderness but with no guarding or rigidity, no lower extremity edema.  CT of the abdomen/pelvis with small bowel obstruction with transition point in the mid abdomen, metastatic lesions in the posterior right lobe of the liver slightly greater than prior study.  Lytic lesion L3.   Yolanda Watson failed medical therapy and underwent exploratory laparotomy with small bowel resection on 4/21, posteriorly a wound VAC was placed 4/23.  She was placed nothing by mouth and required TPN.  Slowly she has been improving and her diet was with good toleration.  She is very weak and deconditioned, will need a skilled nursing facility to continue rehabilitation.  Assessment & Plan:   Principal Problem:   SBO (small bowel obstruction) (HCC) Active Problems:   Abdominal pain   Nausea & vomiting   Acute hyponatremia   AKI (acute kidney injury) ([place])   Leukocytosis   1. Small bowel obstruction, sp exploratory laparotomy and bowel resection 0000000, wound vac  application 0000000.  Yolanda Watson with improved abdominal pain, no nausea or vomiting. Continue to be very weak and deconditioned. Completed 11 days of antibiotic therapy with ciprofloxacin and metronidazole.   Continue to encourage mobility, out of bed to chair tid with meals, continue pain control with ketorolac and acetaminophen. Continue wound vac care. On methocarbamol, and as needed oxycodone.   Continue with pantoprazole for acute gastritis. Continue with nutritional supplements.   2. Recovered acute hypoxic respiratory failure/ pos operative atelectasis.   3. Resolved AKI with hypernatremia, hyper and hypokalemia, hypomagnesemia/ metabolic alkalosis. Renal function has improved, now tolerating po well. Mg today 1,6, will give 2 g Mag sulfate today IV. Continue to hold on IV fluids for now.   4. Metastatic colon cancer, inferior vena cava thrombus. Continue anticoagulation with rivaroxaban. Follow with oncology as outpatient.   5. Rheumatoid arthritis. Bilateral hand deformities, no active pain.    Status is: Inpatient  Remains inpatient appropriate because:Inpatient level of care appropriate due to severity of illness, Yolanda Watson very weak and deconditioned, pending placement at SNF to continue care.    Dispo: [name] is from: SNF              Anticipated d/c is to: SNF              Anticipated d/c date is: 1 day              Yolanda Watson currently is medically stable to d/c. Pending placement at SNF        DVT prophylaxis:  enoxaparin   Code Status:   DNR   Family Communication: no family at the bedside  Nutrition Status: Nutrition Problem: Increased nutrient needs Etiology: acute illness Signs/Symptoms: estimated needs Interventions: Ensure Enlive (each supplement provides 350kcal and 20 grams of protein), Boost Breeze, Magic cup, TPN     Skin Documentation:     Consultants:   Surgery   Procedures:   Bowel resection   Wound vac placement    Antimicrobials:       Subjective: Yolanda Watson feeling better, but continue to be very weak and deconditioned, no nausea or vomiting, no chest pain or dyspnea.   Objective: Vitals:   05/02/20 0447 05/02/20 1325 05/02/20 2159 05/03/20 0550  BP: 108/61 105/64 (!) 101/55 111/71  Pulse: 84 85 74 82  Resp: 20 17 18 18   Temp: 98.2 F (36.8 C) 98.2 F (36.8 C) 97.6 F (36.4 C) 98.5 F (36.9 C)  TempSrc: Oral Oral Oral Oral  SpO2: 95% 97% 96% 97%  Weight: 48.6 kg   48.5 kg  Height:        Intake/Output Summary (Last 24 hours) at 05/03/2020 1030 Last data filed at 05/03/2020 1027 Gross per 24 hour  Intake 1040 ml  Output 401 ml  Net 639 ml   Filed Weights   05/01/20 1607 05/02/20 0447 05/03/20 0550  Weight: 49.9 kg 48.6 kg 48.5 kg    Examination:   General: Not in pain or dyspnea, deconditioned  Neurology: Awake and alert, non focal  E ENT: positive pallor, no icterus, oral mucosa moist Cardiovascular: No JVD. S1-S2 present, rhythmic, no gallops, rubs, or murmurs. No lower extremity edema. Pulmonary: vesicular breath sounds bilaterally, adequate air movement, no wheezing, rhonchi or rales. Gastrointestinal. Abdomen with no organomegaly, mild tenderness to superficial palpation, no rebound or guarding/ Skin. Abdominal wound vac in place.  Musculoskeletal: no joint deformities     Data Reviewed: I have personally reviewed following labs and imaging studies  CBC: Recent Labs  Lab 04/28/20 0335 04/29/20 0351 04/30/20 0402 05/01/20 0353 05/02/20 0823  WBC 19.7* 17.2* 13.1* 11.1* 10.8*  HGB 9.9* 9.5* 9.1* 8.7* 9.1*  HCT 30.3* 29.5* 28.7* 27.2* 28.4*  MCV 102.0* 102.1* 102.5* 101.9* 102.2*  PLT 233 301 333 364 [id number]*   Basic Metabolic Panel: Recent Labs  Lab 04/27/20 0420 04/27/20 0420 04/28/20 0335 04/28/20 0335 04/29/20 0351 04/30/20 0402 05/01/20 0353 05/02/20 0406 05/02/20 0823 05/03/20 0310  NA 131*   < > 133*  --  133* 133* 134*  --  134*  --   K 4.6   < >  4.0  --  3.9 3.6 3.1*  --  3.7  --   CL 101   < > 103  --  102 103 102  --  101  --   CO2 26   < > 23  --  23 26 24   --  26  --   GLUCOSE 143*   < > 104*  --  105* 98 105*  --  101*  --   BUN 24*   < > 17  --  11 8 <5*  --  <5*  --   CREATININE 0.60   < > 0.59  --  0.56 0.56 0.54  --  0.60  --   CALCIUM 7.7*   < > 7.7*  --  7.8* 7.6* 7.6*  --  7.6*  --   MG 1.8   < > 1.8   < > 1.6* 1.7 1.4* 1.8  --  1.6*  PHOS 4.1  --   --   --   --   --   --   --   --   --    < > =  values in this interval not displayed.   GFR: Estimated Creatinine Clearance: 52.2 mL/min (by C-G formula based on SCr of 0.6 mg/dL). Liver Function Tests: Recent Labs  Lab 04/27/20 0420  AST 12*  ALT 10  ALKPHOS 98  BILITOT 0.3  PROT 6.2*  ALBUMIN 2.1*   No results for input(s): LIPASE, AMYLASE in the last 168 hours. No results for input(s): AMMONIA in the last 168 hours. Coagulation Profile: No results for input(s): INR, PROTIME in the last 168 hours. Cardiac Enzymes: No results for input(s): CKTOTAL, CKMB, CKMBINDEX, TROPONINI in the last 168 hours. BNP (last 3 results) No results for input(s): PROBNP in the last 8760 hours. HbA1C: No results for input(s): HGBA1C in the last 72 hours. CBG: Recent Labs  Lab 04/26/20 1142 04/26/20 1628 04/26/20 2344 04/27/20 0805 04/27/20 1141  GLUCAP 127* 107* 138* 120* 107*   Lipid Profile: No results for input(s): CHOL, HDL, LDLCALC, TRIG, CHOLHDL, LDLDIRECT in the last 72 hours. Thyroid Function Tests: No results for input(s): TSH, T4TOTAL, FREET4, T3FREE, THYROIDAB in the last 72 hours. Anemia Panel: No results for input(s): VITAMINB12, FOLATE, FERRITIN, TIBC, IRON, RETICCTPCT in the last 72 hours.    Radiology Studies: I have reviewed all of the imaging during this hospital visit personally     Scheduled Meds: . acetaminophen  1,000 mg Oral Q6H  . Chlorhexidine Gluconate Cloth  6 each Topical Daily  . feeding supplement  1 Container Oral Q24H  .  feeding supplement (ENSURE ENLIVE)  237 mL Oral Q24H  . mouth rinse  15 mL Mouth Rinse BID  . methocarbamol  500 mg Oral TID  . pantoprazole  40 mg Oral BID  . potassium chloride  40 mEq Oral Once  . rivaroxaban  20 mg Oral Daily  . sodium chloride flush  10-40 mL Intracatheter Q12H  . sodium chloride flush  3 mL Intravenous Q12H   Continuous Infusions: . sodium chloride 250 mL (04/30/20 1738)     LOS: 22 days        Crosby Oriordan Gerome Apley, MD

## 2020-05-03 NOTE — Progress Notes (Addendum)
Patients port dressing is due to be changed. IV team consult order placed. IV nurse said it will be changed tomorrow.

## 2020-05-04 LAB — BASIC METABOLIC PANEL
Anion gap: 9 (ref 5–15)
BUN: 10 mg/dL (ref 8–23)
CO2: 25 mmol/L (ref 22–32)
Calcium: 7.6 mg/dL — ABNORMAL LOW (ref 8.9–10.3)
Chloride: 101 mmol/L (ref 98–111)
Creatinine, Ser: 0.62 mg/dL (ref 0.44–1.00)
GFR calc Af Amer: >60 mL/min (ref >60)
GFR calc non Af Amer: >60 mL/min (ref >60)
Glucose, Bld: 88 mg/dL (ref 70–99)
Potassium: 3.7 mmol/L (ref 3.5–5.1)
Sodium: 135 mmol/L (ref 135–145)

## 2020-05-04 LAB — MAGNESIUM: Magnesium: 1.9 mg/dL (ref 1.7–2.4)

## 2020-05-04 MED ORDER — OXYCODONE HCL 5 MG PO TABS: 5.0000 mg | ORAL_TABLET | Freq: Four times a day (QID) | ORAL | 0 refills | Status: DC | PRN

## 2020-05-04 MED ORDER — PANTOPRAZOLE SODIUM 40 MG PO TBEC: 40.0000 mg | DELAYED_RELEASE_TABLET | Freq: Two times a day (BID) | ORAL | 0 refills | Status: DC

## 2020-05-04 MED ORDER — ACETAMINOPHEN 500 MG PO TABS: 1000.0000 mg | ORAL_TABLET | Freq: Four times a day (QID) | ORAL | 0 refills | Status: DC | PRN

## 2020-05-04 MED ORDER — ENSURE ENLIVE PO LIQD: 237.0000 mL | ORAL | 12 refills | Status: DC

## 2020-05-04 MED ORDER — HEPARIN SOD (PORK) LOCK FLUSH 100 UNIT/ML IV SOLN
500.0000 [IU] | INTRAVENOUS | Status: DC | PRN
  Administered 2020-05-04: 500 [IU]
  Filled 2020-05-04 (×2): qty 5

## 2020-05-04 MED ORDER — HEPARIN SOD (PORK) LOCK FLUSH 100 UNIT/ML IV SOLN: 500.0000 [IU] | INTRAVENOUS | Status: DC

## 2020-05-04 NOTE — Discharge Summary (Addendum)
Physician Discharge Summary  FIORE KUTZKE [id number] DOB: 02/12/1951 DOA: 04/11/2020  PCP: Patient, No Pcp Per  Admit date: 04/11/2020 Discharge date: 05/04/2020  Admitted From: Home  Disposition:  Home   Recommendations for Outpatient Follow-up and new medication changes:  1. Follow up with Primary Care in 7 days.  2. Patient has been placed on acetaminophen and oxycodone for pain control.  3. Continue wound vac at discharge and follow with surgical team as scheduled.   Home Health: Yes   Equipment/Devices: wheelchair    Discharge Condition: stable  CODE STATUS: full  Diet recommendation: regular   Brief/Interim Summary: Patient admitted with the working diagnosis of small bowel obstruction with perforation, now status post exploratory laparotomy resection 0000000, application of wound VAC 42/24.   69 year old female who presented with abdominal pain.  She does have the significant past medical history for metastatic colon cancer, tobacco abuse, and abdominal wall thrombosis.  She reported 4 days of general abdominal discomfort, constant, worse with palpation, associated with constipation and decreased flatulence.  Positive nausea and vomiting.  On her initial physical examination temperature 97.4, heart rate 78, blood pressure 117/74, respiratory rate 16, oxygen saturation 96%.  She had a dry mucous membranes, her lungs were clear to auscultation bilaterally, heart S1-S2, present and rhythmic, abdomen with decreased bowel sounds, positive distention and tenderness but with no guarding or rigidity, no lower extremity edema.  Sodium 129, potassium 3.6, chloride 81, bicarb 28, glucose 154, BUN 87, creatinine 1.68, lipase 59, white count 20.8, hemoglobin 16.3, hematocrit 46.2, platelets 211.  SARS COVID-19 negative.  Urinalysis 11-20 white cells, specific gravity 1.045  CT of the abdomen/pelvis with small bowel obstruction with transition point in the mid abdomen, metastatic lesions in the  posterior right lobe of the liver slightly greater than prior study.  Lytic lesion L3.   Patient failed medical therapy and underwent exploratory laparotomy with small bowel resection on 4/21, posteriorly a wound VAC was placed 4/23.  She was placed nothing by mouth and required TPN.  Slowly she has been improving and her diet was advanced with good toleration.  She is very weak and deconditioned, she declined SNF and will go home with home health services.   1.  Small bowel obstruction status post laparotomy and bowel resection 04/21 and wound VAC placement 04/23.  Patient had a prolonged hospitalization, she was initially placed nothing by mouth, received intravenous fluids, as needed analgesics and antiemetics.  Patient had a nasogastric tube placed to low intermittent suction, patient had persistent abdominal distention, with positive obstructive symptoms.  Decision was made to proceed with exploratory laparotomy, repair of multiple serosal tears and small bowel resection, lysis of adhesions and application of wound VAC.  Patient required total parenteral nutrition, she was placed on antibiotic therapy, ciprofloxacin and metronidazole for 11 days.  Patient's condition slowly improved, nasogastric tube was removed, her diet was advanced and TPN was discontinued.  Patient continued to be very weak and deconditioned, she declined transfer to a skilled nursing facility, she will continue with home health services.  2.  Resolved hypoxic respiratory failure. Patient had post-operative atelectasis, improved with incentive spirometer and supplemental 02 per [place]. This is not considered a surgical complication.    3. Resolved AKI with hypo-hypernatremia, hyper-hypokalemia, hypomagnesemia and metabolic alkalosis. Patient received supportive medical therapy including IV fluids and electrolyte repletion. At discharge her renal function improved.   4. Metastatic colon cancer. Inferior vena cava thrombus.  Will continue anticoagulation with rivaroxaban. Follow up  with oncology as outpatient.   5. Rheumatoid arthritis. Bilateral hand deformities, follow as outpatient.   6. Gastritis (cofee ground emesis). Continue with proton pump inhibitors.    Discharge Diagnoses:  Principal Problem:   SBO (small bowel obstruction) (HCC) Active Problems:   Abdominal pain   Nausea & vomiting   Acute hyponatremia   AKI (acute kidney injury) ([place])   Leukocytosis    Discharge Instructions  Discharge Instructions    Diet - low sodium heart healthy   Complete by: As directed    Increase activity slowly   Complete by: As directed      Allergies as of 05/04/2020      Reactions   Penicillins Other (See Comments)   Tolerated cefepime 01/01/2018 "PASSED OUT AS A CHILD" > ? SYNCOPE ? PATIENT HAS HAD A PCN REACTION WITH IMMEDIATE RASH, FACIAL/TONGUE/THROAT SWELLING, SOB, OR LIGHTHEADEDNESS WITH HYPOTENSION:  #  #  #  YES  #  #  #   Has patient had a PCN reaction causing severe rash involving mucus membranes or skin necrosis: No Has patient had a PCN reaction that required hospitalization: No Has patient had a PCN reaction occurring within the last 10 years: No   Latex Itching, Rash   BED "CHUX PADS"      Medication List    STOP taking these medications   naproxen sodium 220 MG tablet Commonly known as: ALEVE     TAKE these medications   acetaminophen 500 MG tablet Commonly known as: TYLENOL Take 2 tablets (1,000 mg total) by mouth every 6 (six) hours as needed.   feeding supplement (ENSURE ENLIVE) Liqd Take 237 mLs by mouth daily.   oxyCODONE 5 MG immediate release tablet Commonly known as: Oxy IR/ROXICODONE Take 1 tablet (5 mg total) by mouth every 6 (six) hours as needed for moderate pain or severe pain.   pantoprazole 40 MG tablet Commonly known as: PROTONIX Take 1 tablet (40 mg total) by mouth 2 (two) times daily.   Xarelto 20 MG Tabs tablet Generic drug: rivaroxaban TAKE 1 TABLET  (20 MG TOTAL) BY MOUTH DAILY WITH SUPPER. What changed: See the new instructions.            Durable Medical Equipment  (From admission, onward)         Start     Ordered   05/04/20 0956  For home use only DME standard manual wheelchair with seat cushion  Once    Comments: Patient suffers from ambulatory dysfunction which impairs their ability to perform daily activities like bathing in the home.  A walker will not resolve issue with performing activities of daily living. A wheelchair will allow patient to safely perform daily activities. Patient can safely propel the wheelchair in the home or has a caregiver who can provide assistance. Length of need 6 months . Accessories: elevating leg rests (ELRs), wheel locks, extensions and anti-tippers.   05/04/20 0955         Follow-up Information    Home, [facility] Follow up.   Specialty: [facility] Contact information: [address] [place] [address] [phone]        Jovita Kussmaul, MD. Go on 05/25/2020.   Specialty: General Surgery Why: Your appointment is 5/27 at 1:40pm Please arrive 30 minutes prior to your appointment to check in and fill out paperwork. Engineer, civil (consulting) ID and Doctor, general practice information: [address] [place] [id number] [phone]  Allergies  Allergen Reactions  . Penicillins Other (See Comments)    Tolerated cefepime 01/01/2018 "PASSED OUT AS A CHILD" > ? SYNCOPE ? PATIENT HAS HAD A PCN REACTION WITH IMMEDIATE RASH, FACIAL/TONGUE/THROAT SWELLING, SOB, OR LIGHTHEADEDNESS WITH HYPOTENSION:  #  #  #  YES  #  #  #   Has patient had a PCN reaction causing severe rash involving mucus membranes or skin necrosis: No Has patient had a PCN reaction that required hospitalization: No Has patient had a PCN reaction occurring within the last 10 years: No   . Latex Itching and Rash    BED "CHUX PADS"    Consultations:  Surgery     Procedures/Studies: DG Abd 1 View  Result Date: 04/22/2020 CLINICAL DATA:  Confirm NG tube EXAM: ABDOMEN - 1 VIEW COMPARISON:  April 18, 2020 FINDINGS: The NG tube side port is in the body of the stomach with the distal tip in the fundus. Dilated loops of small bowel are less prominent the interval. IMPRESSION: 1. The NG tube is in good position. Persistent but decreased prominence of the dilated small bowel loops. Electronically Signed   By: Dorise Bullion III M.D   On: 04/22/2020 13:29   DG Abd 1 View  Result Date: 04/13/2020 CLINICAL DATA:  Small bowel obstruction EXAM: ABDOMEN - 1 VIEW COMPARISON:  04/12/2020 FINDINGS: Dilated small bowel loops noted compatible with small bowel obstruction. Oral contrast material appears to now have entered the right colon compatible with partial small bowel obstruction, but likely high-grade. NG tube is in the stomach. No free air organomegaly. IMPRESSION: Continued partial small bowel obstruction pattern, likely high-grade. Electronically Signed   By: Rolm Baptise M.D.   On: 04/13/2020 07:53   CT ABDOMEN PELVIS W CONTRAST  Result Date: 04/11/2020 CLINICAL DATA:  Bowel obstruction. EXAM: CT ABDOMEN AND PELVIS WITH CONTRAST TECHNIQUE: Multidetector CT imaging of the abdomen and pelvis was performed using the standard protocol following bolus administration of intravenous contrast. CONTRAST:  17mL OMNIPAQUE IOHEXOL 300 MG/ML  SOLN COMPARISON:  CT abdomen pelvis 09/08/2019 FINDINGS: Lower chest: Scarring at the lung bases is stable. Previously noted subpleural nodule is not visualized on today's exam. Heart size is normal. No significant pleural or pericardial effusion is present. Hepatobiliary: An ill-defined mass lesion in the posterior right lobe measures 3.5 x 3.5 x 2.9 cm, increased in size since the prior exam. The second lesion along the inferior [place] lobe is no longer present. Granulomatous changes are noted. Pancreas: Pancreatic duct dilation is new.  No mass lesion is present. Spleen: Granulomatous changes are present. Adrenals/Urinary Tract: Adrenal glands are normal bilaterally. Kidneys and ureters are unremarkable. No stone or mass lesion is present. Stomach/Bowel: Stomach is distended. Small bowel obstruction is present. Transition point is in the mid abdomen, best seen on sagittal image 62 of series 5. Transition is at the level of image 49 of series 2. Distal small bowel and colon are collapsed. No free air is present. Vascular/Lymphatic: Atherosclerotic calcifications are present in the aorta and branch vessels without aneurysm. Previously noted portacaval nodes have decreased in size. Retroperitoneal nodes have decreased in size. Left inguinal node measures up to 13 mm in short axis, slightly decreased in size. Paraspinal mass at L3 has diminished. Reproductive: Uterus and bilateral adnexa are unremarkable. Other: Adhesions are present. No free fluid is present. Ventral laxity is noted. Musculoskeletal: Lytic and sclerotic lesions are again noted at L3, greatest on the left. No new osseous metastases are present. Advanced  facet disease is present. The hips are located and within normal limits. IMPRESSION: 1. Small bowel obstruction with transition point in the mid abdomen. 2. Metastatic lesion in the posterior right lobe of the liver is slightly larger than on the prior study. 3. The second lesion along the inferior right hepatic lobe is no longer present. 4. Lytic and sclerotic lesions at L3, greatest on the left. No new osseous metastases are present. Adjacent paraspinal mass at L3 has diminished. 5. Decreased size of retroperitoneal and portacaval lymph nodes. 6. New pancreatic duct dilation without a discrete mass lesion. 7. Aortic Atherosclerosis (ICD10-I70.0). Electronically Signed   By: San Morelle M.D.   On: 04/11/2020 18:10   DG CHEST PORT 1 VIEW  Result Date: 04/27/2020 CLINICAL DATA:  Leukocytosis. Shortness of breath and nausea.  No metastatic colon cancer. EXAM: PORTABLE CHEST 1 VIEW COMPARISON:  04/20/2020 FINDINGS: Right IJ Port-A-Cath unchanged. Lungs are adequately inflated with new airspace process over the right upper lobe containing a 9 mm nodular component. No effusion. Cardiomediastinal silhouette and remainder of the exam is unchanged. IMPRESSION: New airspace process over the right upper lobe with small nodular component likely infection. Recommend follow-up to resolution. Electronically Signed   By: Marin Olp M.D.   On: 04/27/2020 08:29   DG CHEST PORT 1 VIEW  Result Date: 04/20/2020 CLINICAL DATA:  69 year old female with shortness of breath, weakness and nausea. Metastatic colon cancer. EXAM: PORTABLE CHEST 1 VIEW COMPARISON:  Chest radiographs 08/20/2019. CT Abdomen and Pelvis 04/11/2020. FINDINGS: Portable AP semi upright view at 0917 hours. Stable right chest power port, accessed. An enteric tube courses to the abdomen, side hole is at the level of the gastric cardia. Lower lung volumes. Crowding of markings at both lung bases. No pneumothorax, pulmonary edema, or confluent pulmonary opacity. Mediastinal contours remain normal. Visualized tracheal air column is within normal limits. Stable visualized osseous structures. IMPRESSION: 1. Low lung volumes, otherwise no acute cardiopulmonary abnormality. 2. Enteric tube placed into the stomach, side hole at the level of the gastric cardia. Electronically Signed   By: Genevie Ann M.D.   On: 04/20/2020 12:58   DG Abd Portable 1V  Result Date: 04/18/2020 CLINICAL DATA:  Small bowel obstruction. EXAM: PORTABLE ABDOMEN - 1 VIEW COMPARISON:  One-view abdomen 04/17/20 FINDINGS: Dilated loops of small bowel remain, slightly less conspicuous than on the prior exam. Wall thickening is improved. Oral contrast is present colon. NG tube is in place. IMPRESSION: 1. Improving small bowel obstruction. 2. Oral contrast is present in the colon. Electronically Signed   By: San Morelle M.D.   On: 04/18/2020 07:48   DG Abd Portable 1V  Result Date: 04/17/2020 CLINICAL DATA:  Small-bowel obstruction. EXAM: PORTABLE ABDOMEN - 1 VIEW COMPARISON:  04/15/2020 FINDINGS: Mild decrease in caliber of multiple loops of dilated jejunum. Interval mucosal thickening involving those dilated loops. Gas and dilute oral contrast in normal caliber colon. Nasogastric tube tip and side hole in the proximal stomach. Lumbar spine degenerative changes and mild scoliosis. IMPRESSION: Mild improvement in the pattern of partial small bowel obstruction with interval evidence of mucosal thickening involving the dilated loops of jejunum without pneumatosis. This could be due to infectious, inflammatory or ischemic enteritis. Electronically Signed   By: Claudie Revering M.D.   On: 04/17/2020 08:03   DG Abd Portable 1V  Result Date: 04/15/2020 CLINICAL DATA:  Small-bowel obstruction. EXAM: PORTABLE ABDOMEN - 1 VIEW COMPARISON:  1 day prior FINDINGS: Single supine view of the  abdomen and pelvis. The upper abdomen is excluded. Nasogastric tube is looped in the stomach. Contrast identified within the colon. Small bowel dilatation is relatively similar, including at 3.6 cm. Distal gas identified. No gross free intraperitoneal air. IMPRESSION: Similar partial small bowel obstruction. Nasogastric tube incompletely imaged but looped in the stomach. Electronically Signed   By: Abigail Miyamoto M.D.   On: 04/15/2020 06:51   DG Abd Portable 1V  Result Date: 04/14/2020 CLINICAL DATA:  69 year old female with history of small-bowel obstruction. EXAM: PORTABLE ABDOMEN - 1 VIEW COMPARISON:  Chest x-ray 04/13/2020. FINDINGS: Nasogastric tube coiled in the stomach. Small amount of oral contrast material in the proximal stomach. Gas and stool noted throughout the colon including the distal rectum. Persistent dilatation of small-bowel loops in the left upper quadrant measuring up to 4 cm in diameter. Overall, the degree of small-bowel  dilatation has improved compared to the prior examination. IMPRESSION: 1. Findings are compatible with residual but improving partial small bowel obstruction, as above. 2. Nasogastric tube coiled in the stomach. Electronically Signed   By: Vinnie Langton M.D.   On: 04/14/2020 07:46   DG Abd Portable 1V-Small Bowel Obstruction Protocol-initial, 8 hr delay  Result Date: 04/12/2020 CLINICAL DATA:  Small-bowel obstruction EXAM: PORTABLE ABDOMEN - 1 VIEW COMPARISON:  04/11/2020 FINDINGS: Supine frontal view of the abdomen and pelvis was performed approximately 8 hours after administration of contrast via indwelling enteric catheter. Enteric catheter again seen within the gastric fundus. Contrast is seen outlining the stomach and multiple dilated loops of small bowel. There is no colonic contrast seen on this exam. Overall, caliber of the small bowel is not appreciably changed since prior studies. IMPRESSION: 1. Continued dilated loops of small bowel consistent with small-bowel obstruction. 2. No evidence of oral contrast within the colon. Electronically Signed   By: Randa Ngo M.D.   On: 04/12/2020 21:27   DG Abd Portable 1 View  Result Date: 04/11/2020 CLINICAL DATA:  Tube placement EXAM: PORTABLE ABDOMEN - 1 VIEW COMPARISON:  CT 04/11/2020 FINDINGS: Lung bases are clear. Esophageal tube tip and side port overlie the proximal stomach. Excreted contrast within the kidneys. IMPRESSION: Esophageal tube tip overlies the gastric fundus Electronically Signed   By: Donavan Foil M.D.   On: 04/11/2020 20:45      Procedures:  EXPLORATORY LAPAROTOMY, REPAIR OF MULTIPLE SEROSAL TEARS (N/A) SMALL BOWEL RESECTION (N/A) LYSIS OF ADHESION APPLICATION OF WOUND VAC (N/A)  Subjective: Patient feeling better, she has decided to declined SNF and is willing to go home. No nausea or vomiting. Improved abdominal pain.   Discharge Exam: Vitals:   05/03/20 2108 05/04/20 0556  BP: (!) 148/69 114/68  Pulse: 96 81   Resp: 18 18  Temp: 99.1 F (37.3 C) 98.2 F (36.8 C)  SpO2: 94% 99%   Vitals:   05/03/20 0550 05/03/20 1302 05/03/20 2108 05/04/20 0556  BP: 111/71 107/61 (!) 148/69 114/68  Pulse: 82 81 96 81  Resp: 18 16 18 18   Temp: 98.5 F (36.9 C) (!) 97.3 F (36.3 C) 99.1 F (37.3 C) 98.2 F (36.8 C)  TempSrc: Oral Oral Oral Oral  SpO2: 97% 94% 94% 99%  Weight: 48.5 kg   48.5 kg  Height:        General: Not in pain or dyspnea.  Neurology: Awake and alert, non focal  E ENT: no pallor, no icterus, oral mucosa moist Cardiovascular: No JVD. S1-S2 present, rhythmic, no gallops, rubs, or murmurs. No lower extremity edema. Pulmonary:  positive breath sounds bilaterally, adequate air movement, no wheezing, rhonchi or rales. Gastrointestinal. Abdomen with no organomegaly, non tender, no rebound or guarding. Wound vac in place.  Skin. No rashes Musculoskeletal: no joint deformities   The results of significant diagnostics from this hospitalization (including imaging, microbiology, ancillary and laboratory) are listed below for reference.     Microbiology: No results found for this or any previous visit (from the past 240 hour(s)).   Labs: BNP (last 3 results) No results for input(s): BNP in the last 8760 hours. Basic Metabolic Panel: Recent Labs  Lab 04/29/20 0351 04/29/20 0351 04/30/20 0402 05/01/20 0353 05/02/20 0406 05/02/20 0823 05/03/20 0310 05/04/20 0304  NA 133*  --  133* 134*  --  134*  --  135  K 3.9  --  3.6 3.1*  --  3.7  --  3.7  CL 102  --  103 102  --  101  --  101  CO2 23  --  26 24  --  26  --  25  GLUCOSE 105*  --  98 105*  --  101*  --  88  BUN 11  --  8 <5*  --  <5*  --  10  CREATININE 0.56  --  0.56 0.54  --  0.60  --  0.62  CALCIUM 7.8*  --  7.6* 7.6*  --  7.6*  --  7.6*  MG 1.6*   < > 1.7 1.4* 1.8  --  1.6* 1.9   < > = values in this interval not displayed.   Liver Function Tests: No results for input(s): AST, ALT, ALKPHOS, BILITOT, PROT, ALBUMIN in  the last 168 hours. No results for input(s): LIPASE, AMYLASE in the last 168 hours. No results for input(s): AMMONIA in the last 168 hours. CBC: Recent Labs  Lab 04/28/20 0335 04/29/20 0351 04/30/20 0402 05/01/20 0353 05/02/20 0823  WBC 19.7* 17.2* 13.1* 11.1* 10.8*  HGB 9.9* 9.5* 9.1* 8.7* 9.1*  HCT 30.3* 29.5* 28.7* 27.2* 28.4*  MCV 102.0* 102.1* 102.5* 101.9* 102.2*  PLT 233 301 333 364 426*   Cardiac Enzymes: No results for input(s): CKTOTAL, CKMB, CKMBINDEX, TROPONINI in the last 168 hours. BNP: Invalid input(s): POCBNP CBG: Recent Labs  Lab 04/27/20 1141  GLUCAP 107*   D-Dimer No results for input(s): DDIMER in the last 72 hours. Hgb A1c No results for input(s): HGBA1C in the last 72 hours. Lipid Profile No results for input(s): CHOL, HDL, LDLCALC, TRIG, CHOLHDL, LDLDIRECT in the last 72 hours. Thyroid function studies No results for input(s): TSH, T4TOTAL, T3FREE, THYROIDAB in the last 72 hours.  Invalid input(s): FREET3 Anemia work up No results for input(s): VITAMINB12, FOLATE, FERRITIN, TIBC, IRON, RETICCTPCT in the last 72 hours. Urinalysis    Component Value Date/Time   COLORURINE YELLOW 04/27/2020 [place] 04/27/2020 0747   LABSPEC 1.011 04/27/2020 0747   PHURINE 5.0 04/27/2020 [place] 04/27/2020 0747   HGBUR NEGATIVE 04/27/2020 0747   [facility] NEGATIVE 04/27/2020 0747   [place] 04/27/2020 0747   PROTEINUR NEGATIVE 04/27/2020 0747   NITRITE NEGATIVE 04/27/2020 0747   LEUKOCYTESUR TRACE (A) 04/27/2020 0747   Sepsis Labs Invalid input(s): PROCALCITONIN,  WBC,  LACTICIDVEN Microbiology No results found for this or any previous visit (from the past 240 hour(s)).   Time coordinating discharge: 45 minutes  SIGNED:   Tawni Millers, MD  [facility] 05/04/2020, 9:59 AM

## 2020-05-04 NOTE — Progress Notes (Signed)
[facility] Progress Note  13 Days Post-Op  Subjective: CC-  Changed her mind and has decided against SNF again, ready to go home today. Tolerating diet, BM yesterday.  Objective: Vital signs in last 24 hours: Temp:  [97.3 F (36.3 C)-99.1 F (37.3 C)] 98.2 F (36.8 C) (05/06 0556) Pulse Rate:  [81-96] 81 (05/06 0556) Resp:  [16-18] 18 (05/06 0556) BP: (107-148)/(61-69) 114/68 (05/06 0556) SpO2:  [94 %-99 %] 99 % (05/06 0556) Weight:  [48.5 kg] 48.5 kg (05/06 0556) Last BM Date: 04/30/20  Intake/Output from previous day: 05/05 0701 - 05/06 0700 In: 1570 [P.O.:1440; I.V.:80; IV Piggyback:50] Out: 0  Intake/Output this shift: No intake/output data recorded.  PE: Gen: Alert, NAD, pleasant HEENT: EOM's intact, pupils equal and round Pulm: rate and effort normal [id number], mild diffuse tenderness, +BS, vac to midline wound with good seal/ small amount of serous fluid in cannister   Lab Results:  Recent Labs    05/02/20 0823  WBC 10.8*  HGB 9.1*  HCT 28.4*  PLT 426*   BMET Recent Labs    05/02/20 0823 05/04/20 0304  NA 134* 135  K 3.7 3.7  CL 101 101  CO2 26 25  GLUCOSE 101* 88  BUN <5* 10  CREATININE 0.60 0.62  CALCIUM 7.6* 7.6*   PT/INR No results for input(s): LABPROT, INR in the last 72 hours. CMP     Component Value Date/Time   NA 135 05/04/2020 0304   NA 141 01/15/2018 0000   NA 138 08/07/2017 0835   K 3.7 05/04/2020 0304   K 4.1 08/07/2017 0835   CL 101 05/04/2020 0304   CO2 25 05/04/2020 0304   CO2 25 08/07/2017 0835   GLUCOSE 88 05/04/2020 0304   GLUCOSE 95 08/07/2017 0835   BUN 10 05/04/2020 0304   BUN 4 01/15/2018 0000   BUN 9.3 08/07/2017 0835   CREATININE 0.62 05/04/2020 0304   CREATININE 0.80 03/16/2020 1002   CREATININE 0.57 02/05/2018 1144   CREATININE 0.7 08/07/2017 0835   CALCIUM 7.6 (L) 05/04/2020 0304   CALCIUM 9.4 08/07/2017 0835   PROT 6.2 (L) 04/27/2020 0420   PROT 7.3 08/07/2017 0835   ALBUMIN 2.1 (L)  04/27/2020 0420   ALBUMIN 3.4 (L) 08/07/2017 0835   AST 12 (L) 04/27/2020 0420   AST 13 (L) 03/16/2020 1002   AST 25 08/07/2017 0835   ALT 10 04/27/2020 0420   ALT 11 03/16/2020 1002   ALT 21 08/07/2017 0835   ALKPHOS 98 04/27/2020 0420   ALKPHOS 138 08/07/2017 0835   BILITOT 0.3 04/27/2020 0420   BILITOT 0.4 03/16/2020 1002   BILITOT 0.49 08/07/2017 0835   GFRNONAA >60 05/04/2020 0304   GFRNONAA >60 03/16/2020 1002   GFRAA >60 05/04/2020 0304   GFRAA >60 03/16/2020 1002   Lipase     Component Value Date/Time   LIPASE 59 (H) 04/11/2020 1542       Studies/Results: No results found.  Anti-infectives: Anti-infectives (From admission, onward)   Start     Dose/Rate Route Frequency Ordered Stop   04/20/20 1000  ciprofloxacin (CIPRO) IVPB 400 mg  Status:  Discontinued     400 mg 200 mL/hr over 60 Minutes Intravenous Every 12 hours 04/20/20 0856 05/01/20 1013   04/20/20 1000  metroNIDAZOLE (FLAGYL) IVPB 500 mg  Status:  Discontinued     500 mg 100 mL/hr over 60 Minutes Intravenous Every 8 hours 04/20/20 0856 05/01/20 1013   04/19/20 0600  ciprofloxacin (CIPRO) IVPB  400 mg     400 mg 200 mL/hr over 60 Minutes Intravenous On call to O.R. 04/18/20 1449 04/19/20 1200       Assessment/Plan Chronic tobacco use Abdominal wall thrombus-on Xarelto Hx EtOH abuse AKI Malnutrition - prealbumin7.9(4/26), off TNA and tolerating soft diet  Metastatic colon cancerwith right hemicolectomy 2016 - ongoing chemotherapy-Dr. Benay Spice - Excision of malignant mass from the abdominal including skin, muscle, fascia and peritoneum(15 x 12cm), abdominal reconstruction with [name], 10/03/17 Dr. Barry Dienes and [name]  POD#15/13-s/p ex lap with SBR for contained perforation fromSBOand takeback for abdominal wall closure, Dr. Marlou Starks - [facility] changeson MWF - tolerating diet and having bowel function but still has poor PO intake - afebrile, off antibiotics - continue dulcolax  suppository PRN constipation  ID -cipro/flagyl 4/22>>5/3 FEN -reg diet VTE -xarelto Foley -none Follow up -Dr. Marlou Starks, oncology   Plan: Patient has again decided against SNF and wants to go home instead. [place] for discharge home today with HH. Discharge instructions and follow up info on AVS.   LOS: 23 days    Wellington [place], [facility] 05/04/2020, 9:12 AM Please see Amion for pager number during day hours 7:00am-4:30pm

## 2020-05-04 NOTE — TOC Transition Note (Signed)
Transition of Care [facility]) - CM/SW Discharge Note   Patient Details  Name: Yolanda Watson MRN: [id number] Date of Birth: 05-26-1951  [facility]) CM/SW Contact:  Lia Hopping, [place] Phone Number: 05/04/2020, 9:51 AM   Clinical Narrative:    Patient will discharge home today.  Home Health agency notified of patient discharge.  [place] notified.  Patient will transport by [place] home.  WC ordered through [facility].    Final next level of care: [place] Barriers to Discharge: Barriers Resolved   Patient Goals and CMS Choice Patient states their goals for this hospitalization and ongoing recovery are:: to go home CMS Medicare.gov Compare Post Acute Care list provided to:: Patient Choice offered to / list presented to : Patient  Discharge Placement                Patient to be transferred to facility by: [place] Name of family member notified: Patient notified spouse/son Patient and family notified of of transfer: 05/04/20  Discharge Plan and Services   Discharge Planning Services: CM Consult Post Acute Care Choice: Home Health          DME Arranged: Vac, Wheelchair manual DME Agency: [facility] Date DME Agency Contacted: 05/04/20 Time DME Agency Contacted: [phone] Representative spoke with at DME Agency: [facility] for [place]: RN, PT, OT, Nurse's Aide, Social Work [employer] Agency: [facility] Date [place]: 05/04/20 Time [place]: [phone] Representative spoke with at [place]: Amy  Social Determinants of Health ([place]) Interventions     Readmission Risk Interventions No flowsheet data found.

## 2020-05-04 NOTE — Progress Notes (Signed)
Discharge instructions given to pt and all questions were answered.  

## 2020-05-04 NOTE — Progress Notes (Signed)
    Durable Medical Equipment  (From admission, onward)         Start     Ordered   05/04/20 1000  For home use only DME high strength lightweight manual wheelchair with seat cushion  Once    Comments: Patient suffers from gait instability which impairs their ability to perform daily activities like performing activities like bathing dressing in the home.  A rolling walker will not resolve  issue with performing activities of daily living. A wheelchair will allow patient to safely perform daily activities.Length of need lifetime.Will need Accessories: elevating leg rests, wheel locks, extensions and anti-tippers.   05/04/20 1002   05/04/20 0958  For home use only DME lightweight manual wheelchair with seat cushion  Once    Comments: Pt has trouble ambulating long distances.   05/04/20 0959   05/04/20 0956  For home use only DME standard manual wheelchair with seat cushion  Once    Comments: Patient suffers from ambulatory dysfunction which impairs their ability to perform daily activities like bathing in the home.  A walker will not resolve issue with performing activities of daily living. A wheelchair will allow patient to safely perform daily activities. Patient can safely propel the wheelchair in the home or has a caregiver who can provide assistance. Length of need 6 months . Accessories: elevating leg rests (ELRs), wheel locks, extensions and anti-tippers.   05/04/20 [id number]

## 2020-05-05 DIAGNOSIS — R2681 Unsteadiness on feet: Secondary | ICD-10-CM | POA: Diagnosis not present

## 2020-05-05 DIAGNOSIS — I82220 Acute embolism and thrombosis of inferior vena cava: Secondary | ICD-10-CM | POA: Diagnosis not present

## 2020-05-05 DIAGNOSIS — M6281 Muscle weakness (generalized): Secondary | ICD-10-CM | POA: Diagnosis not present

## 2020-05-05 DIAGNOSIS — Z9221 Personal history of antineoplastic chemotherapy: Secondary | ICD-10-CM | POA: Diagnosis not present

## 2020-05-05 DIAGNOSIS — Z7901 Long term (current) use of anticoagulants: Secondary | ICD-10-CM | POA: Diagnosis not present

## 2020-05-05 DIAGNOSIS — C189 Malignant neoplasm of colon, unspecified: Secondary | ICD-10-CM | POA: Diagnosis not present

## 2020-05-05 DIAGNOSIS — C787 Secondary malignant neoplasm of liver and intrahepatic bile duct: Secondary | ICD-10-CM | POA: Diagnosis not present

## 2020-05-05 DIAGNOSIS — M06041 Rheumatoid arthritis without rheumatoid factor, right hand: Secondary | ICD-10-CM | POA: Diagnosis not present

## 2020-05-05 DIAGNOSIS — F17210 Nicotine dependence, cigarettes, uncomplicated: Secondary | ICD-10-CM | POA: Diagnosis not present

## 2020-05-05 DIAGNOSIS — Z4801 Encounter for change or removal of surgical wound dressing: Secondary | ICD-10-CM | POA: Diagnosis not present

## 2020-05-05 DIAGNOSIS — Z48815 Encounter for surgical aftercare following surgery on the digestive system: Secondary | ICD-10-CM | POA: Diagnosis not present

## 2020-05-05 DIAGNOSIS — M06042 Rheumatoid arthritis without rheumatoid factor, left hand: Secondary | ICD-10-CM | POA: Diagnosis not present

## 2020-05-06 DIAGNOSIS — I82220 Acute embolism and thrombosis of inferior vena cava: Secondary | ICD-10-CM | POA: Diagnosis not present

## 2020-05-06 DIAGNOSIS — Z48815 Encounter for surgical aftercare following surgery on the digestive system: Secondary | ICD-10-CM | POA: Diagnosis not present

## 2020-05-06 DIAGNOSIS — C189 Malignant neoplasm of colon, unspecified: Secondary | ICD-10-CM | POA: Diagnosis not present

## 2020-05-06 DIAGNOSIS — C787 Secondary malignant neoplasm of liver and intrahepatic bile duct: Secondary | ICD-10-CM | POA: Diagnosis not present

## 2020-05-06 DIAGNOSIS — Z4801 Encounter for change or removal of surgical wound dressing: Secondary | ICD-10-CM | POA: Diagnosis not present

## 2020-05-06 DIAGNOSIS — M06041 Rheumatoid arthritis without rheumatoid factor, right hand: Secondary | ICD-10-CM | POA: Diagnosis not present

## 2020-05-08 ENCOUNTER — Other Ambulatory Visit: Payer: Self-pay | Admitting: Oncology

## 2020-05-08 ENCOUNTER — Other Ambulatory Visit: Payer: Self-pay | Admitting: Nurse Practitioner

## 2020-05-08 ENCOUNTER — Telehealth: Payer: Self-pay | Admitting: *Deleted

## 2020-05-08 DIAGNOSIS — Z4801 Encounter for change or removal of surgical wound dressing: Secondary | ICD-10-CM | POA: Diagnosis not present

## 2020-05-08 DIAGNOSIS — C189 Malignant neoplasm of colon, unspecified: Secondary | ICD-10-CM | POA: Diagnosis not present

## 2020-05-08 DIAGNOSIS — C787 Secondary malignant neoplasm of liver and intrahepatic bile duct: Secondary | ICD-10-CM | POA: Diagnosis not present

## 2020-05-08 DIAGNOSIS — I82220 Acute embolism and thrombosis of inferior vena cava: Secondary | ICD-10-CM | POA: Diagnosis not present

## 2020-05-08 DIAGNOSIS — M06041 Rheumatoid arthritis without rheumatoid factor, right hand: Secondary | ICD-10-CM | POA: Diagnosis not present

## 2020-05-08 DIAGNOSIS — Z48815 Encounter for surgical aftercare following surgery on the digestive system: Secondary | ICD-10-CM | POA: Diagnosis not present

## 2020-05-08 MED ORDER — OXYCODONE HCL 5 MG PO TABS: 5.0000 mg | ORAL_TABLET | Freq: Four times a day (QID) | ORAL | 0 refills | Status: DC | PRN

## 2020-05-08 NOTE — Telephone Encounter (Signed)
Called to request refill on the oxycodone 5 mg. Is out of medication (surgeon ordered #10 tabs). She still has her wound vac. Dr. Benay Spice agrees to refill. Wants to see her this week or next, but she declines, saying 'I'm weak as a kitten". Would like to wait a couple weeks to see where she is. Informed her that RN will check on her in 2 weeks. She agrees.

## 2020-05-09 DIAGNOSIS — M06041 Rheumatoid arthritis without rheumatoid factor, right hand: Secondary | ICD-10-CM | POA: Diagnosis not present

## 2020-05-09 DIAGNOSIS — I82220 Acute embolism and thrombosis of inferior vena cava: Secondary | ICD-10-CM | POA: Diagnosis not present

## 2020-05-09 DIAGNOSIS — Z48815 Encounter for surgical aftercare following surgery on the digestive system: Secondary | ICD-10-CM | POA: Diagnosis not present

## 2020-05-09 DIAGNOSIS — C787 Secondary malignant neoplasm of liver and intrahepatic bile duct: Secondary | ICD-10-CM | POA: Diagnosis not present

## 2020-05-09 DIAGNOSIS — Z4801 Encounter for change or removal of surgical wound dressing: Secondary | ICD-10-CM | POA: Diagnosis not present

## 2020-05-09 DIAGNOSIS — C189 Malignant neoplasm of colon, unspecified: Secondary | ICD-10-CM | POA: Diagnosis not present

## 2020-05-10 DIAGNOSIS — M06041 Rheumatoid arthritis without rheumatoid factor, right hand: Secondary | ICD-10-CM | POA: Diagnosis not present

## 2020-05-10 DIAGNOSIS — Z48815 Encounter for surgical aftercare following surgery on the digestive system: Secondary | ICD-10-CM | POA: Diagnosis not present

## 2020-05-10 DIAGNOSIS — C787 Secondary malignant neoplasm of liver and intrahepatic bile duct: Secondary | ICD-10-CM | POA: Diagnosis not present

## 2020-05-10 DIAGNOSIS — I82220 Acute embolism and thrombosis of inferior vena cava: Secondary | ICD-10-CM | POA: Diagnosis not present

## 2020-05-10 DIAGNOSIS — C189 Malignant neoplasm of colon, unspecified: Secondary | ICD-10-CM | POA: Diagnosis not present

## 2020-05-10 DIAGNOSIS — Z4801 Encounter for change or removal of surgical wound dressing: Secondary | ICD-10-CM | POA: Diagnosis not present

## 2020-05-11 DIAGNOSIS — Z4801 Encounter for change or removal of surgical wound dressing: Secondary | ICD-10-CM | POA: Diagnosis not present

## 2020-05-11 DIAGNOSIS — C787 Secondary malignant neoplasm of liver and intrahepatic bile duct: Secondary | ICD-10-CM | POA: Diagnosis not present

## 2020-05-11 DIAGNOSIS — Z48815 Encounter for surgical aftercare following surgery on the digestive system: Secondary | ICD-10-CM | POA: Diagnosis not present

## 2020-05-11 DIAGNOSIS — M06041 Rheumatoid arthritis without rheumatoid factor, right hand: Secondary | ICD-10-CM | POA: Diagnosis not present

## 2020-05-11 DIAGNOSIS — I82220 Acute embolism and thrombosis of inferior vena cava: Secondary | ICD-10-CM | POA: Diagnosis not present

## 2020-05-11 DIAGNOSIS — C189 Malignant neoplasm of colon, unspecified: Secondary | ICD-10-CM | POA: Diagnosis not present

## 2020-05-12 DIAGNOSIS — C189 Malignant neoplasm of colon, unspecified: Secondary | ICD-10-CM | POA: Diagnosis not present

## 2020-05-12 DIAGNOSIS — Z4801 Encounter for change or removal of surgical wound dressing: Secondary | ICD-10-CM | POA: Diagnosis not present

## 2020-05-12 DIAGNOSIS — M06041 Rheumatoid arthritis without rheumatoid factor, right hand: Secondary | ICD-10-CM | POA: Diagnosis not present

## 2020-05-12 DIAGNOSIS — I82220 Acute embolism and thrombosis of inferior vena cava: Secondary | ICD-10-CM | POA: Diagnosis not present

## 2020-05-12 DIAGNOSIS — Z48815 Encounter for surgical aftercare following surgery on the digestive system: Secondary | ICD-10-CM | POA: Diagnosis not present

## 2020-05-12 DIAGNOSIS — C787 Secondary malignant neoplasm of liver and intrahepatic bile duct: Secondary | ICD-10-CM | POA: Diagnosis not present

## 2020-05-15 DIAGNOSIS — Z4801 Encounter for change or removal of surgical wound dressing: Secondary | ICD-10-CM | POA: Diagnosis not present

## 2020-05-15 DIAGNOSIS — Z48815 Encounter for surgical aftercare following surgery on the digestive system: Secondary | ICD-10-CM | POA: Diagnosis not present

## 2020-05-15 DIAGNOSIS — M06041 Rheumatoid arthritis without rheumatoid factor, right hand: Secondary | ICD-10-CM | POA: Diagnosis not present

## 2020-05-15 DIAGNOSIS — C189 Malignant neoplasm of colon, unspecified: Secondary | ICD-10-CM | POA: Diagnosis not present

## 2020-05-15 DIAGNOSIS — I82220 Acute embolism and thrombosis of inferior vena cava: Secondary | ICD-10-CM | POA: Diagnosis not present

## 2020-05-15 DIAGNOSIS — C787 Secondary malignant neoplasm of liver and intrahepatic bile duct: Secondary | ICD-10-CM | POA: Diagnosis not present

## 2020-05-16 DIAGNOSIS — C189 Malignant neoplasm of colon, unspecified: Secondary | ICD-10-CM | POA: Diagnosis not present

## 2020-05-16 DIAGNOSIS — M06041 Rheumatoid arthritis without rheumatoid factor, right hand: Secondary | ICD-10-CM | POA: Diagnosis not present

## 2020-05-16 DIAGNOSIS — Z4801 Encounter for change or removal of surgical wound dressing: Secondary | ICD-10-CM | POA: Diagnosis not present

## 2020-05-16 DIAGNOSIS — C787 Secondary malignant neoplasm of liver and intrahepatic bile duct: Secondary | ICD-10-CM | POA: Diagnosis not present

## 2020-05-16 DIAGNOSIS — Z48815 Encounter for surgical aftercare following surgery on the digestive system: Secondary | ICD-10-CM | POA: Diagnosis not present

## 2020-05-16 DIAGNOSIS — I82220 Acute embolism and thrombosis of inferior vena cava: Secondary | ICD-10-CM | POA: Diagnosis not present

## 2020-05-17 DIAGNOSIS — Z4801 Encounter for change or removal of surgical wound dressing: Secondary | ICD-10-CM | POA: Diagnosis not present

## 2020-05-17 DIAGNOSIS — C787 Secondary malignant neoplasm of liver and intrahepatic bile duct: Secondary | ICD-10-CM | POA: Diagnosis not present

## 2020-05-17 DIAGNOSIS — I82220 Acute embolism and thrombosis of inferior vena cava: Secondary | ICD-10-CM | POA: Diagnosis not present

## 2020-05-17 DIAGNOSIS — Z48815 Encounter for surgical aftercare following surgery on the digestive system: Secondary | ICD-10-CM | POA: Diagnosis not present

## 2020-05-17 DIAGNOSIS — M06041 Rheumatoid arthritis without rheumatoid factor, right hand: Secondary | ICD-10-CM | POA: Diagnosis not present

## 2020-05-17 DIAGNOSIS — C189 Malignant neoplasm of colon, unspecified: Secondary | ICD-10-CM | POA: Diagnosis not present

## 2020-05-18 DIAGNOSIS — C189 Malignant neoplasm of colon, unspecified: Secondary | ICD-10-CM | POA: Diagnosis not present

## 2020-05-18 DIAGNOSIS — M06041 Rheumatoid arthritis without rheumatoid factor, right hand: Secondary | ICD-10-CM | POA: Diagnosis not present

## 2020-05-18 DIAGNOSIS — C787 Secondary malignant neoplasm of liver and intrahepatic bile duct: Secondary | ICD-10-CM | POA: Diagnosis not present

## 2020-05-18 DIAGNOSIS — I82220 Acute embolism and thrombosis of inferior vena cava: Secondary | ICD-10-CM | POA: Diagnosis not present

## 2020-05-18 DIAGNOSIS — Z4801 Encounter for change or removal of surgical wound dressing: Secondary | ICD-10-CM | POA: Diagnosis not present

## 2020-05-18 DIAGNOSIS — Z48815 Encounter for surgical aftercare following surgery on the digestive system: Secondary | ICD-10-CM | POA: Diagnosis not present

## 2020-05-19 DIAGNOSIS — M06041 Rheumatoid arthritis without rheumatoid factor, right hand: Secondary | ICD-10-CM | POA: Diagnosis not present

## 2020-05-19 DIAGNOSIS — Z48815 Encounter for surgical aftercare following surgery on the digestive system: Secondary | ICD-10-CM | POA: Diagnosis not present

## 2020-05-19 DIAGNOSIS — C189 Malignant neoplasm of colon, unspecified: Secondary | ICD-10-CM | POA: Diagnosis not present

## 2020-05-19 DIAGNOSIS — C787 Secondary malignant neoplasm of liver and intrahepatic bile duct: Secondary | ICD-10-CM | POA: Diagnosis not present

## 2020-05-19 DIAGNOSIS — I82220 Acute embolism and thrombosis of inferior vena cava: Secondary | ICD-10-CM | POA: Diagnosis not present

## 2020-05-19 DIAGNOSIS — Z4801 Encounter for change or removal of surgical wound dressing: Secondary | ICD-10-CM | POA: Diagnosis not present

## 2020-05-22 DIAGNOSIS — Z4801 Encounter for change or removal of surgical wound dressing: Secondary | ICD-10-CM | POA: Diagnosis not present

## 2020-05-22 DIAGNOSIS — C189 Malignant neoplasm of colon, unspecified: Secondary | ICD-10-CM | POA: Diagnosis not present

## 2020-05-22 DIAGNOSIS — M06041 Rheumatoid arthritis without rheumatoid factor, right hand: Secondary | ICD-10-CM | POA: Diagnosis not present

## 2020-05-22 DIAGNOSIS — Z48815 Encounter for surgical aftercare following surgery on the digestive system: Secondary | ICD-10-CM | POA: Diagnosis not present

## 2020-05-22 DIAGNOSIS — I82220 Acute embolism and thrombosis of inferior vena cava: Secondary | ICD-10-CM | POA: Diagnosis not present

## 2020-05-22 DIAGNOSIS — C787 Secondary malignant neoplasm of liver and intrahepatic bile duct: Secondary | ICD-10-CM | POA: Diagnosis not present

## 2020-05-23 ENCOUNTER — Other Ambulatory Visit: Payer: Self-pay | Admitting: Nurse Practitioner

## 2020-05-23 ENCOUNTER — Telehealth: Payer: Self-pay | Admitting: *Deleted

## 2020-05-23 DIAGNOSIS — M06041 Rheumatoid arthritis without rheumatoid factor, right hand: Secondary | ICD-10-CM | POA: Diagnosis not present

## 2020-05-23 DIAGNOSIS — C787 Secondary malignant neoplasm of liver and intrahepatic bile duct: Secondary | ICD-10-CM

## 2020-05-23 DIAGNOSIS — C189 Malignant neoplasm of colon, unspecified: Secondary | ICD-10-CM

## 2020-05-23 DIAGNOSIS — I82220 Acute embolism and thrombosis of inferior vena cava: Secondary | ICD-10-CM | POA: Diagnosis not present

## 2020-05-23 DIAGNOSIS — Z48815 Encounter for surgical aftercare following surgery on the digestive system: Secondary | ICD-10-CM | POA: Diagnosis not present

## 2020-05-23 DIAGNOSIS — Z4801 Encounter for change or removal of surgical wound dressing: Secondary | ICD-10-CM | POA: Diagnosis not present

## 2020-05-23 MED ORDER — OXYCODONE HCL 5 MG PO TABS: 5.0000 mg | ORAL_TABLET | Freq: Four times a day (QID) | ORAL | 0 refills | Status: DC | PRN

## 2020-05-23 NOTE — Telephone Encounter (Signed)
Called to request refill on her oxycodone. Declines to make an appointment at this time due to still feeling weak and has woundvac in place. Will call her in 2 weeks to f/u on her and willingness to come in.

## 2020-05-24 DIAGNOSIS — C787 Secondary malignant neoplasm of liver and intrahepatic bile duct: Secondary | ICD-10-CM | POA: Diagnosis not present

## 2020-05-24 DIAGNOSIS — M06041 Rheumatoid arthritis without rheumatoid factor, right hand: Secondary | ICD-10-CM | POA: Diagnosis not present

## 2020-05-24 DIAGNOSIS — Z4801 Encounter for change or removal of surgical wound dressing: Secondary | ICD-10-CM | POA: Diagnosis not present

## 2020-05-24 DIAGNOSIS — C189 Malignant neoplasm of colon, unspecified: Secondary | ICD-10-CM | POA: Diagnosis not present

## 2020-05-24 DIAGNOSIS — I82220 Acute embolism and thrombosis of inferior vena cava: Secondary | ICD-10-CM | POA: Diagnosis not present

## 2020-05-24 DIAGNOSIS — Z48815 Encounter for surgical aftercare following surgery on the digestive system: Secondary | ICD-10-CM | POA: Diagnosis not present

## 2020-05-25 DIAGNOSIS — Z4801 Encounter for change or removal of surgical wound dressing: Secondary | ICD-10-CM | POA: Diagnosis not present

## 2020-05-25 DIAGNOSIS — C189 Malignant neoplasm of colon, unspecified: Secondary | ICD-10-CM | POA: Diagnosis not present

## 2020-05-25 DIAGNOSIS — C787 Secondary malignant neoplasm of liver and intrahepatic bile duct: Secondary | ICD-10-CM | POA: Diagnosis not present

## 2020-05-25 DIAGNOSIS — M06041 Rheumatoid arthritis without rheumatoid factor, right hand: Secondary | ICD-10-CM | POA: Diagnosis not present

## 2020-05-25 DIAGNOSIS — I82220 Acute embolism and thrombosis of inferior vena cava: Secondary | ICD-10-CM | POA: Diagnosis not present

## 2020-05-25 DIAGNOSIS — Z48815 Encounter for surgical aftercare following surgery on the digestive system: Secondary | ICD-10-CM | POA: Diagnosis not present

## 2020-05-26 DIAGNOSIS — I82220 Acute embolism and thrombosis of inferior vena cava: Secondary | ICD-10-CM | POA: Diagnosis not present

## 2020-05-26 DIAGNOSIS — Z48815 Encounter for surgical aftercare following surgery on the digestive system: Secondary | ICD-10-CM | POA: Diagnosis not present

## 2020-05-26 DIAGNOSIS — M06041 Rheumatoid arthritis without rheumatoid factor, right hand: Secondary | ICD-10-CM | POA: Diagnosis not present

## 2020-05-26 DIAGNOSIS — Z4801 Encounter for change or removal of surgical wound dressing: Secondary | ICD-10-CM | POA: Diagnosis not present

## 2020-05-26 DIAGNOSIS — C189 Malignant neoplasm of colon, unspecified: Secondary | ICD-10-CM | POA: Diagnosis not present

## 2020-05-26 DIAGNOSIS — C787 Secondary malignant neoplasm of liver and intrahepatic bile duct: Secondary | ICD-10-CM | POA: Diagnosis not present

## 2020-05-29 DIAGNOSIS — Z4801 Encounter for change or removal of surgical wound dressing: Secondary | ICD-10-CM | POA: Diagnosis not present

## 2020-05-29 DIAGNOSIS — Z48815 Encounter for surgical aftercare following surgery on the digestive system: Secondary | ICD-10-CM | POA: Diagnosis not present

## 2020-05-29 DIAGNOSIS — I82220 Acute embolism and thrombosis of inferior vena cava: Secondary | ICD-10-CM | POA: Diagnosis not present

## 2020-05-29 DIAGNOSIS — C787 Secondary malignant neoplasm of liver and intrahepatic bile duct: Secondary | ICD-10-CM | POA: Diagnosis not present

## 2020-05-29 DIAGNOSIS — M06041 Rheumatoid arthritis without rheumatoid factor, right hand: Secondary | ICD-10-CM | POA: Diagnosis not present

## 2020-05-29 DIAGNOSIS — C189 Malignant neoplasm of colon, unspecified: Secondary | ICD-10-CM | POA: Diagnosis not present

## 2020-05-30 ENCOUNTER — Other Ambulatory Visit: Payer: Self-pay | Admitting: Oncology

## 2020-05-30 ENCOUNTER — Other Ambulatory Visit: Payer: Self-pay | Admitting: *Deleted

## 2020-05-30 DIAGNOSIS — C787 Secondary malignant neoplasm of liver and intrahepatic bile duct: Secondary | ICD-10-CM | POA: Diagnosis not present

## 2020-05-30 DIAGNOSIS — I82220 Acute embolism and thrombosis of inferior vena cava: Secondary | ICD-10-CM | POA: Diagnosis not present

## 2020-05-30 DIAGNOSIS — C189 Malignant neoplasm of colon, unspecified: Secondary | ICD-10-CM | POA: Diagnosis not present

## 2020-05-30 DIAGNOSIS — M06041 Rheumatoid arthritis without rheumatoid factor, right hand: Secondary | ICD-10-CM | POA: Diagnosis not present

## 2020-05-30 DIAGNOSIS — Z48815 Encounter for surgical aftercare following surgery on the digestive system: Secondary | ICD-10-CM | POA: Diagnosis not present

## 2020-05-30 DIAGNOSIS — Z4801 Encounter for change or removal of surgical wound dressing: Secondary | ICD-10-CM | POA: Diagnosis not present

## 2020-05-30 MED FILL — XARELTO 20 MG TABLET: 20 | 30 days supply | Qty: 30 | Fill #0

## 2020-05-31 DIAGNOSIS — C787 Secondary malignant neoplasm of liver and intrahepatic bile duct: Secondary | ICD-10-CM | POA: Diagnosis not present

## 2020-05-31 DIAGNOSIS — C189 Malignant neoplasm of colon, unspecified: Secondary | ICD-10-CM | POA: Diagnosis not present

## 2020-05-31 DIAGNOSIS — Z4801 Encounter for change or removal of surgical wound dressing: Secondary | ICD-10-CM | POA: Diagnosis not present

## 2020-05-31 DIAGNOSIS — I82220 Acute embolism and thrombosis of inferior vena cava: Secondary | ICD-10-CM | POA: Diagnosis not present

## 2020-05-31 DIAGNOSIS — Z48815 Encounter for surgical aftercare following surgery on the digestive system: Secondary | ICD-10-CM | POA: Diagnosis not present

## 2020-05-31 DIAGNOSIS — M06041 Rheumatoid arthritis without rheumatoid factor, right hand: Secondary | ICD-10-CM | POA: Diagnosis not present

## 2020-06-01 DIAGNOSIS — C189 Malignant neoplasm of colon, unspecified: Secondary | ICD-10-CM | POA: Diagnosis not present

## 2020-06-01 DIAGNOSIS — I82220 Acute embolism and thrombosis of inferior vena cava: Secondary | ICD-10-CM | POA: Diagnosis not present

## 2020-06-01 DIAGNOSIS — M06041 Rheumatoid arthritis without rheumatoid factor, right hand: Secondary | ICD-10-CM | POA: Diagnosis not present

## 2020-06-01 DIAGNOSIS — Z4801 Encounter for change or removal of surgical wound dressing: Secondary | ICD-10-CM | POA: Diagnosis not present

## 2020-06-01 DIAGNOSIS — Z48815 Encounter for surgical aftercare following surgery on the digestive system: Secondary | ICD-10-CM | POA: Diagnosis not present

## 2020-06-01 DIAGNOSIS — C787 Secondary malignant neoplasm of liver and intrahepatic bile duct: Secondary | ICD-10-CM | POA: Diagnosis not present

## 2020-06-02 DIAGNOSIS — C787 Secondary malignant neoplasm of liver and intrahepatic bile duct: Secondary | ICD-10-CM | POA: Diagnosis not present

## 2020-06-02 DIAGNOSIS — M06041 Rheumatoid arthritis without rheumatoid factor, right hand: Secondary | ICD-10-CM | POA: Diagnosis not present

## 2020-06-02 DIAGNOSIS — I82220 Acute embolism and thrombosis of inferior vena cava: Secondary | ICD-10-CM | POA: Diagnosis not present

## 2020-06-02 DIAGNOSIS — Z48815 Encounter for surgical aftercare following surgery on the digestive system: Secondary | ICD-10-CM | POA: Diagnosis not present

## 2020-06-02 DIAGNOSIS — C189 Malignant neoplasm of colon, unspecified: Secondary | ICD-10-CM | POA: Diagnosis not present

## 2020-06-02 DIAGNOSIS — Z4801 Encounter for change or removal of surgical wound dressing: Secondary | ICD-10-CM | POA: Diagnosis not present

## 2020-06-04 DIAGNOSIS — Z9221 Personal history of antineoplastic chemotherapy: Secondary | ICD-10-CM | POA: Diagnosis not present

## 2020-06-04 DIAGNOSIS — R2681 Unsteadiness on feet: Secondary | ICD-10-CM | POA: Diagnosis not present

## 2020-06-04 DIAGNOSIS — Z4801 Encounter for change or removal of surgical wound dressing: Secondary | ICD-10-CM | POA: Diagnosis not present

## 2020-06-04 DIAGNOSIS — F17210 Nicotine dependence, cigarettes, uncomplicated: Secondary | ICD-10-CM | POA: Diagnosis not present

## 2020-06-04 DIAGNOSIS — C189 Malignant neoplasm of colon, unspecified: Secondary | ICD-10-CM | POA: Diagnosis not present

## 2020-06-04 DIAGNOSIS — I82220 Acute embolism and thrombosis of inferior vena cava: Secondary | ICD-10-CM | POA: Diagnosis not present

## 2020-06-04 DIAGNOSIS — Z48815 Encounter for surgical aftercare following surgery on the digestive system: Secondary | ICD-10-CM | POA: Diagnosis not present

## 2020-06-04 DIAGNOSIS — Z7901 Long term (current) use of anticoagulants: Secondary | ICD-10-CM | POA: Diagnosis not present

## 2020-06-04 DIAGNOSIS — C787 Secondary malignant neoplasm of liver and intrahepatic bile duct: Secondary | ICD-10-CM | POA: Diagnosis not present

## 2020-06-04 DIAGNOSIS — M06041 Rheumatoid arthritis without rheumatoid factor, right hand: Secondary | ICD-10-CM | POA: Diagnosis not present

## 2020-06-04 DIAGNOSIS — M6281 Muscle weakness (generalized): Secondary | ICD-10-CM | POA: Diagnosis not present

## 2020-06-04 DIAGNOSIS — M06042 Rheumatoid arthritis without rheumatoid factor, left hand: Secondary | ICD-10-CM | POA: Diagnosis not present

## 2020-06-05 DIAGNOSIS — C189 Malignant neoplasm of colon, unspecified: Secondary | ICD-10-CM | POA: Diagnosis not present

## 2020-06-05 DIAGNOSIS — C787 Secondary malignant neoplasm of liver and intrahepatic bile duct: Secondary | ICD-10-CM | POA: Diagnosis not present

## 2020-06-05 DIAGNOSIS — I82220 Acute embolism and thrombosis of inferior vena cava: Secondary | ICD-10-CM | POA: Diagnosis not present

## 2020-06-05 DIAGNOSIS — Z48815 Encounter for surgical aftercare following surgery on the digestive system: Secondary | ICD-10-CM | POA: Diagnosis not present

## 2020-06-05 DIAGNOSIS — Z4801 Encounter for change or removal of surgical wound dressing: Secondary | ICD-10-CM | POA: Diagnosis not present

## 2020-06-05 DIAGNOSIS — M06041 Rheumatoid arthritis without rheumatoid factor, right hand: Secondary | ICD-10-CM | POA: Diagnosis not present

## 2020-06-06 DIAGNOSIS — C189 Malignant neoplasm of colon, unspecified: Secondary | ICD-10-CM | POA: Diagnosis not present

## 2020-06-06 DIAGNOSIS — C787 Secondary malignant neoplasm of liver and intrahepatic bile duct: Secondary | ICD-10-CM | POA: Diagnosis not present

## 2020-06-06 DIAGNOSIS — I82220 Acute embolism and thrombosis of inferior vena cava: Secondary | ICD-10-CM | POA: Diagnosis not present

## 2020-06-06 DIAGNOSIS — Z48815 Encounter for surgical aftercare following surgery on the digestive system: Secondary | ICD-10-CM | POA: Diagnosis not present

## 2020-06-06 DIAGNOSIS — Z4801 Encounter for change or removal of surgical wound dressing: Secondary | ICD-10-CM | POA: Diagnosis not present

## 2020-06-06 DIAGNOSIS — M06041 Rheumatoid arthritis without rheumatoid factor, right hand: Secondary | ICD-10-CM | POA: Diagnosis not present

## 2020-06-07 DIAGNOSIS — C189 Malignant neoplasm of colon, unspecified: Secondary | ICD-10-CM | POA: Diagnosis not present

## 2020-06-07 DIAGNOSIS — Z48815 Encounter for surgical aftercare following surgery on the digestive system: Secondary | ICD-10-CM | POA: Diagnosis not present

## 2020-06-07 DIAGNOSIS — Z4801 Encounter for change or removal of surgical wound dressing: Secondary | ICD-10-CM | POA: Diagnosis not present

## 2020-06-07 DIAGNOSIS — M06041 Rheumatoid arthritis without rheumatoid factor, right hand: Secondary | ICD-10-CM | POA: Diagnosis not present

## 2020-06-07 DIAGNOSIS — C787 Secondary malignant neoplasm of liver and intrahepatic bile duct: Secondary | ICD-10-CM | POA: Diagnosis not present

## 2020-06-07 DIAGNOSIS — I82220 Acute embolism and thrombosis of inferior vena cava: Secondary | ICD-10-CM | POA: Diagnosis not present

## 2020-06-08 DIAGNOSIS — Z4801 Encounter for change or removal of surgical wound dressing: Secondary | ICD-10-CM | POA: Diagnosis not present

## 2020-06-08 DIAGNOSIS — M06041 Rheumatoid arthritis without rheumatoid factor, right hand: Secondary | ICD-10-CM | POA: Diagnosis not present

## 2020-06-08 DIAGNOSIS — C189 Malignant neoplasm of colon, unspecified: Secondary | ICD-10-CM | POA: Diagnosis not present

## 2020-06-08 DIAGNOSIS — Z48815 Encounter for surgical aftercare following surgery on the digestive system: Secondary | ICD-10-CM | POA: Diagnosis not present

## 2020-06-08 DIAGNOSIS — I82220 Acute embolism and thrombosis of inferior vena cava: Secondary | ICD-10-CM | POA: Diagnosis not present

## 2020-06-08 DIAGNOSIS — C787 Secondary malignant neoplasm of liver and intrahepatic bile duct: Secondary | ICD-10-CM | POA: Diagnosis not present

## 2020-06-09 DIAGNOSIS — Z48815 Encounter for surgical aftercare following surgery on the digestive system: Secondary | ICD-10-CM | POA: Diagnosis not present

## 2020-06-09 DIAGNOSIS — I82220 Acute embolism and thrombosis of inferior vena cava: Secondary | ICD-10-CM | POA: Diagnosis not present

## 2020-06-09 DIAGNOSIS — Z4801 Encounter for change or removal of surgical wound dressing: Secondary | ICD-10-CM | POA: Diagnosis not present

## 2020-06-09 DIAGNOSIS — C787 Secondary malignant neoplasm of liver and intrahepatic bile duct: Secondary | ICD-10-CM | POA: Diagnosis not present

## 2020-06-09 DIAGNOSIS — M06041 Rheumatoid arthritis without rheumatoid factor, right hand: Secondary | ICD-10-CM | POA: Diagnosis not present

## 2020-06-09 DIAGNOSIS — C189 Malignant neoplasm of colon, unspecified: Secondary | ICD-10-CM | POA: Diagnosis not present

## 2020-06-12 DIAGNOSIS — Z4801 Encounter for change or removal of surgical wound dressing: Secondary | ICD-10-CM | POA: Diagnosis not present

## 2020-06-12 DIAGNOSIS — C189 Malignant neoplasm of colon, unspecified: Secondary | ICD-10-CM | POA: Diagnosis not present

## 2020-06-12 DIAGNOSIS — M06041 Rheumatoid arthritis without rheumatoid factor, right hand: Secondary | ICD-10-CM | POA: Diagnosis not present

## 2020-06-12 DIAGNOSIS — C787 Secondary malignant neoplasm of liver and intrahepatic bile duct: Secondary | ICD-10-CM | POA: Diagnosis not present

## 2020-06-12 DIAGNOSIS — I82220 Acute embolism and thrombosis of inferior vena cava: Secondary | ICD-10-CM | POA: Diagnosis not present

## 2020-06-12 DIAGNOSIS — Z48815 Encounter for surgical aftercare following surgery on the digestive system: Secondary | ICD-10-CM | POA: Diagnosis not present

## 2020-06-14 DIAGNOSIS — C189 Malignant neoplasm of colon, unspecified: Secondary | ICD-10-CM | POA: Diagnosis not present

## 2020-06-14 DIAGNOSIS — Z4801 Encounter for change or removal of surgical wound dressing: Secondary | ICD-10-CM | POA: Diagnosis not present

## 2020-06-14 DIAGNOSIS — M06041 Rheumatoid arthritis without rheumatoid factor, right hand: Secondary | ICD-10-CM | POA: Diagnosis not present

## 2020-06-14 DIAGNOSIS — Z48815 Encounter for surgical aftercare following surgery on the digestive system: Secondary | ICD-10-CM | POA: Diagnosis not present

## 2020-06-14 DIAGNOSIS — C787 Secondary malignant neoplasm of liver and intrahepatic bile duct: Secondary | ICD-10-CM | POA: Diagnosis not present

## 2020-06-14 DIAGNOSIS — I82220 Acute embolism and thrombosis of inferior vena cava: Secondary | ICD-10-CM | POA: Diagnosis not present

## 2020-06-16 DIAGNOSIS — Z4801 Encounter for change or removal of surgical wound dressing: Secondary | ICD-10-CM | POA: Diagnosis not present

## 2020-06-16 DIAGNOSIS — Z48815 Encounter for surgical aftercare following surgery on the digestive system: Secondary | ICD-10-CM | POA: Diagnosis not present

## 2020-06-16 DIAGNOSIS — I82220 Acute embolism and thrombosis of inferior vena cava: Secondary | ICD-10-CM | POA: Diagnosis not present

## 2020-06-16 DIAGNOSIS — M06041 Rheumatoid arthritis without rheumatoid factor, right hand: Secondary | ICD-10-CM | POA: Diagnosis not present

## 2020-06-16 DIAGNOSIS — C787 Secondary malignant neoplasm of liver and intrahepatic bile duct: Secondary | ICD-10-CM | POA: Diagnosis not present

## 2020-06-16 DIAGNOSIS — C189 Malignant neoplasm of colon, unspecified: Secondary | ICD-10-CM | POA: Diagnosis not present

## 2020-06-19 DIAGNOSIS — C787 Secondary malignant neoplasm of liver and intrahepatic bile duct: Secondary | ICD-10-CM | POA: Diagnosis not present

## 2020-06-19 DIAGNOSIS — M06041 Rheumatoid arthritis without rheumatoid factor, right hand: Secondary | ICD-10-CM | POA: Diagnosis not present

## 2020-06-19 DIAGNOSIS — Z4801 Encounter for change or removal of surgical wound dressing: Secondary | ICD-10-CM | POA: Diagnosis not present

## 2020-06-19 DIAGNOSIS — Z48815 Encounter for surgical aftercare following surgery on the digestive system: Secondary | ICD-10-CM | POA: Diagnosis not present

## 2020-06-19 DIAGNOSIS — C189 Malignant neoplasm of colon, unspecified: Secondary | ICD-10-CM | POA: Diagnosis not present

## 2020-06-19 DIAGNOSIS — I82220 Acute embolism and thrombosis of inferior vena cava: Secondary | ICD-10-CM | POA: Diagnosis not present

## 2020-06-21 DIAGNOSIS — C787 Secondary malignant neoplasm of liver and intrahepatic bile duct: Secondary | ICD-10-CM | POA: Diagnosis not present

## 2020-06-21 DIAGNOSIS — Z4801 Encounter for change or removal of surgical wound dressing: Secondary | ICD-10-CM | POA: Diagnosis not present

## 2020-06-21 DIAGNOSIS — I82220 Acute embolism and thrombosis of inferior vena cava: Secondary | ICD-10-CM | POA: Diagnosis not present

## 2020-06-21 DIAGNOSIS — Z48815 Encounter for surgical aftercare following surgery on the digestive system: Secondary | ICD-10-CM | POA: Diagnosis not present

## 2020-06-21 DIAGNOSIS — C189 Malignant neoplasm of colon, unspecified: Secondary | ICD-10-CM | POA: Diagnosis not present

## 2020-06-21 DIAGNOSIS — M06041 Rheumatoid arthritis without rheumatoid factor, right hand: Secondary | ICD-10-CM | POA: Diagnosis not present

## 2020-06-23 DIAGNOSIS — Z48815 Encounter for surgical aftercare following surgery on the digestive system: Secondary | ICD-10-CM | POA: Diagnosis not present

## 2020-06-23 DIAGNOSIS — C787 Secondary malignant neoplasm of liver and intrahepatic bile duct: Secondary | ICD-10-CM | POA: Diagnosis not present

## 2020-06-23 DIAGNOSIS — M06041 Rheumatoid arthritis without rheumatoid factor, right hand: Secondary | ICD-10-CM | POA: Diagnosis not present

## 2020-06-23 DIAGNOSIS — I82220 Acute embolism and thrombosis of inferior vena cava: Secondary | ICD-10-CM | POA: Diagnosis not present

## 2020-06-23 DIAGNOSIS — Z4801 Encounter for change or removal of surgical wound dressing: Secondary | ICD-10-CM | POA: Diagnosis not present

## 2020-06-23 DIAGNOSIS — C189 Malignant neoplasm of colon, unspecified: Secondary | ICD-10-CM | POA: Diagnosis not present

## 2020-06-25 DIAGNOSIS — Z48815 Encounter for surgical aftercare following surgery on the digestive system: Secondary | ICD-10-CM | POA: Diagnosis not present

## 2020-06-25 DIAGNOSIS — C787 Secondary malignant neoplasm of liver and intrahepatic bile duct: Secondary | ICD-10-CM | POA: Diagnosis not present

## 2020-06-25 DIAGNOSIS — M06041 Rheumatoid arthritis without rheumatoid factor, right hand: Secondary | ICD-10-CM | POA: Diagnosis not present

## 2020-06-25 DIAGNOSIS — C189 Malignant neoplasm of colon, unspecified: Secondary | ICD-10-CM | POA: Diagnosis not present

## 2020-06-25 DIAGNOSIS — Z4801 Encounter for change or removal of surgical wound dressing: Secondary | ICD-10-CM | POA: Diagnosis not present

## 2020-06-25 DIAGNOSIS — I82220 Acute embolism and thrombosis of inferior vena cava: Secondary | ICD-10-CM | POA: Diagnosis not present

## 2020-06-30 DIAGNOSIS — Z48815 Encounter for surgical aftercare following surgery on the digestive system: Secondary | ICD-10-CM | POA: Diagnosis not present

## 2020-06-30 DIAGNOSIS — M06041 Rheumatoid arthritis without rheumatoid factor, right hand: Secondary | ICD-10-CM | POA: Diagnosis not present

## 2020-06-30 DIAGNOSIS — Z4801 Encounter for change or removal of surgical wound dressing: Secondary | ICD-10-CM | POA: Diagnosis not present

## 2020-06-30 DIAGNOSIS — C189 Malignant neoplasm of colon, unspecified: Secondary | ICD-10-CM | POA: Diagnosis not present

## 2020-06-30 DIAGNOSIS — C787 Secondary malignant neoplasm of liver and intrahepatic bile duct: Secondary | ICD-10-CM | POA: Diagnosis not present

## 2020-06-30 DIAGNOSIS — I82220 Acute embolism and thrombosis of inferior vena cava: Secondary | ICD-10-CM | POA: Diagnosis not present

## 2020-07-04 DIAGNOSIS — R2681 Unsteadiness on feet: Secondary | ICD-10-CM | POA: Diagnosis not present

## 2020-07-04 DIAGNOSIS — Z7901 Long term (current) use of anticoagulants: Secondary | ICD-10-CM | POA: Diagnosis not present

## 2020-07-04 DIAGNOSIS — M06041 Rheumatoid arthritis without rheumatoid factor, right hand: Secondary | ICD-10-CM | POA: Diagnosis not present

## 2020-07-04 DIAGNOSIS — M6281 Muscle weakness (generalized): Secondary | ICD-10-CM | POA: Diagnosis not present

## 2020-07-04 DIAGNOSIS — M06042 Rheumatoid arthritis without rheumatoid factor, left hand: Secondary | ICD-10-CM | POA: Diagnosis not present

## 2020-07-04 DIAGNOSIS — Z4801 Encounter for change or removal of surgical wound dressing: Secondary | ICD-10-CM | POA: Diagnosis not present

## 2020-07-04 DIAGNOSIS — Z48815 Encounter for surgical aftercare following surgery on the digestive system: Secondary | ICD-10-CM | POA: Diagnosis not present

## 2020-07-04 DIAGNOSIS — Z9221 Personal history of antineoplastic chemotherapy: Secondary | ICD-10-CM | POA: Diagnosis not present

## 2020-07-04 DIAGNOSIS — I82220 Acute embolism and thrombosis of inferior vena cava: Secondary | ICD-10-CM | POA: Diagnosis not present

## 2020-07-04 DIAGNOSIS — C189 Malignant neoplasm of colon, unspecified: Secondary | ICD-10-CM | POA: Diagnosis not present

## 2020-07-04 DIAGNOSIS — C787 Secondary malignant neoplasm of liver and intrahepatic bile duct: Secondary | ICD-10-CM | POA: Diagnosis not present

## 2020-07-04 DIAGNOSIS — F17210 Nicotine dependence, cigarettes, uncomplicated: Secondary | ICD-10-CM | POA: Diagnosis not present

## 2020-07-05 MED FILL — XARELTO 20 MG TABLET: 20 | 30 days supply | Qty: 30 | Fill #1

## 2020-07-06 DIAGNOSIS — C189 Malignant neoplasm of colon, unspecified: Secondary | ICD-10-CM | POA: Diagnosis not present

## 2020-07-06 DIAGNOSIS — Z4801 Encounter for change or removal of surgical wound dressing: Secondary | ICD-10-CM | POA: Diagnosis not present

## 2020-07-06 DIAGNOSIS — C787 Secondary malignant neoplasm of liver and intrahepatic bile duct: Secondary | ICD-10-CM | POA: Diagnosis not present

## 2020-07-06 DIAGNOSIS — M06041 Rheumatoid arthritis without rheumatoid factor, right hand: Secondary | ICD-10-CM | POA: Diagnosis not present

## 2020-07-06 DIAGNOSIS — Z48815 Encounter for surgical aftercare following surgery on the digestive system: Secondary | ICD-10-CM | POA: Diagnosis not present

## 2020-07-06 DIAGNOSIS — I82220 Acute embolism and thrombosis of inferior vena cava: Secondary | ICD-10-CM | POA: Diagnosis not present

## 2020-07-07 ENCOUNTER — Telehealth: Payer: Self-pay | Admitting: *Deleted

## 2020-07-07 NOTE — Telephone Encounter (Signed)
Called patient to f/u on when she agrees to return to office for evaluation and talk w/Dr. Benay Spice. She is doing better with the WoundVac out, but is still doing wet to dry dressing on wound. Sees surgeon on 07/10/20 and states she will call afterwards regarding f/u.

## 2020-07-10 DIAGNOSIS — Z48815 Encounter for surgical aftercare following surgery on the digestive system: Secondary | ICD-10-CM | POA: Diagnosis not present

## 2020-07-10 DIAGNOSIS — M06041 Rheumatoid arthritis without rheumatoid factor, right hand: Secondary | ICD-10-CM | POA: Diagnosis not present

## 2020-07-10 DIAGNOSIS — C787 Secondary malignant neoplasm of liver and intrahepatic bile duct: Secondary | ICD-10-CM | POA: Diagnosis not present

## 2020-07-10 DIAGNOSIS — C189 Malignant neoplasm of colon, unspecified: Secondary | ICD-10-CM | POA: Diagnosis not present

## 2020-07-10 DIAGNOSIS — I82220 Acute embolism and thrombosis of inferior vena cava: Secondary | ICD-10-CM | POA: Diagnosis not present

## 2020-07-10 DIAGNOSIS — Z4801 Encounter for change or removal of surgical wound dressing: Secondary | ICD-10-CM | POA: Diagnosis not present

## 2020-07-14 ENCOUNTER — Telehealth: Payer: Self-pay

## 2020-07-14 DIAGNOSIS — C189 Malignant neoplasm of colon, unspecified: Secondary | ICD-10-CM | POA: Diagnosis not present

## 2020-07-14 DIAGNOSIS — Z4801 Encounter for change or removal of surgical wound dressing: Secondary | ICD-10-CM | POA: Diagnosis not present

## 2020-07-14 DIAGNOSIS — M06041 Rheumatoid arthritis without rheumatoid factor, right hand: Secondary | ICD-10-CM | POA: Diagnosis not present

## 2020-07-14 DIAGNOSIS — Z48815 Encounter for surgical aftercare following surgery on the digestive system: Secondary | ICD-10-CM | POA: Diagnosis not present

## 2020-07-14 DIAGNOSIS — C787 Secondary malignant neoplasm of liver and intrahepatic bile duct: Secondary | ICD-10-CM | POA: Diagnosis not present

## 2020-07-14 DIAGNOSIS — I82220 Acute embolism and thrombosis of inferior vena cava: Secondary | ICD-10-CM | POA: Diagnosis not present

## 2020-07-14 NOTE — Telephone Encounter (Signed)
Spoke with patient and she stated that her would is healing "beautifully". She stated that it's size is smaller than a silver dollar, she is still doing wet to dry dressings. She understands that Dr. Benay Spice wants to speak with her but stated that she is not comfortable with coming in to the office for a visit due to risk for an infection. She is aware to contact this office once her wound is healed for a follow up visit with Dr. Benay Spice.

## 2020-07-17 DIAGNOSIS — C787 Secondary malignant neoplasm of liver and intrahepatic bile duct: Secondary | ICD-10-CM | POA: Diagnosis not present

## 2020-07-17 DIAGNOSIS — Z48815 Encounter for surgical aftercare following surgery on the digestive system: Secondary | ICD-10-CM | POA: Diagnosis not present

## 2020-07-17 DIAGNOSIS — M06041 Rheumatoid arthritis without rheumatoid factor, right hand: Secondary | ICD-10-CM | POA: Diagnosis not present

## 2020-07-17 DIAGNOSIS — I82220 Acute embolism and thrombosis of inferior vena cava: Secondary | ICD-10-CM | POA: Diagnosis not present

## 2020-07-17 DIAGNOSIS — Z4801 Encounter for change or removal of surgical wound dressing: Secondary | ICD-10-CM | POA: Diagnosis not present

## 2020-07-17 DIAGNOSIS — C189 Malignant neoplasm of colon, unspecified: Secondary | ICD-10-CM | POA: Diagnosis not present

## 2020-07-25 DIAGNOSIS — M06041 Rheumatoid arthritis without rheumatoid factor, right hand: Secondary | ICD-10-CM | POA: Diagnosis not present

## 2020-07-25 DIAGNOSIS — I82220 Acute embolism and thrombosis of inferior vena cava: Secondary | ICD-10-CM | POA: Diagnosis not present

## 2020-07-25 DIAGNOSIS — Z48815 Encounter for surgical aftercare following surgery on the digestive system: Secondary | ICD-10-CM | POA: Diagnosis not present

## 2020-07-25 DIAGNOSIS — Z4801 Encounter for change or removal of surgical wound dressing: Secondary | ICD-10-CM | POA: Diagnosis not present

## 2020-07-25 DIAGNOSIS — C189 Malignant neoplasm of colon, unspecified: Secondary | ICD-10-CM | POA: Diagnosis not present

## 2020-07-25 DIAGNOSIS — C787 Secondary malignant neoplasm of liver and intrahepatic bile duct: Secondary | ICD-10-CM | POA: Diagnosis not present

## 2020-07-30 DIAGNOSIS — M06041 Rheumatoid arthritis without rheumatoid factor, right hand: Secondary | ICD-10-CM | POA: Diagnosis not present

## 2020-07-30 DIAGNOSIS — Z48815 Encounter for surgical aftercare following surgery on the digestive system: Secondary | ICD-10-CM | POA: Diagnosis not present

## 2020-07-30 DIAGNOSIS — C787 Secondary malignant neoplasm of liver and intrahepatic bile duct: Secondary | ICD-10-CM | POA: Diagnosis not present

## 2020-07-30 DIAGNOSIS — Z4801 Encounter for change or removal of surgical wound dressing: Secondary | ICD-10-CM | POA: Diagnosis not present

## 2020-07-30 DIAGNOSIS — C189 Malignant neoplasm of colon, unspecified: Secondary | ICD-10-CM | POA: Diagnosis not present

## 2020-07-30 DIAGNOSIS — I82220 Acute embolism and thrombosis of inferior vena cava: Secondary | ICD-10-CM | POA: Diagnosis not present

## 2020-08-03 DIAGNOSIS — Z9221 Personal history of antineoplastic chemotherapy: Secondary | ICD-10-CM | POA: Diagnosis not present

## 2020-08-03 DIAGNOSIS — M06041 Rheumatoid arthritis without rheumatoid factor, right hand: Secondary | ICD-10-CM | POA: Diagnosis not present

## 2020-08-03 DIAGNOSIS — M06042 Rheumatoid arthritis without rheumatoid factor, left hand: Secondary | ICD-10-CM | POA: Diagnosis not present

## 2020-08-03 DIAGNOSIS — Z7901 Long term (current) use of anticoagulants: Secondary | ICD-10-CM | POA: Diagnosis not present

## 2020-08-03 DIAGNOSIS — F17210 Nicotine dependence, cigarettes, uncomplicated: Secondary | ICD-10-CM | POA: Diagnosis not present

## 2020-08-03 DIAGNOSIS — Z48815 Encounter for surgical aftercare following surgery on the digestive system: Secondary | ICD-10-CM | POA: Diagnosis not present

## 2020-08-03 DIAGNOSIS — C189 Malignant neoplasm of colon, unspecified: Secondary | ICD-10-CM | POA: Diagnosis not present

## 2020-08-03 DIAGNOSIS — C787 Secondary malignant neoplasm of liver and intrahepatic bile duct: Secondary | ICD-10-CM | POA: Diagnosis not present

## 2020-08-03 DIAGNOSIS — R2681 Unsteadiness on feet: Secondary | ICD-10-CM | POA: Diagnosis not present

## 2020-08-03 DIAGNOSIS — I82220 Acute embolism and thrombosis of inferior vena cava: Secondary | ICD-10-CM | POA: Diagnosis not present

## 2020-08-03 DIAGNOSIS — Z4801 Encounter for change or removal of surgical wound dressing: Secondary | ICD-10-CM | POA: Diagnosis not present

## 2020-08-03 DIAGNOSIS — M6281 Muscle weakness (generalized): Secondary | ICD-10-CM | POA: Diagnosis not present

## 2020-08-07 ENCOUNTER — Telehealth: Payer: Self-pay | Admitting: *Deleted

## 2020-08-07 ENCOUNTER — Telehealth: Payer: Self-pay | Admitting: Oncology

## 2020-08-07 MED FILL — XARELTO 20 MG TABLET: 20 | 30 days supply | Qty: 30 | Fill #2

## 2020-08-07 NOTE — Telephone Encounter (Signed)
Scheduled per 8/9 sch message. Pt is aware of appt time and date. 

## 2020-08-07 NOTE — Telephone Encounter (Signed)
Patient still does not want to come into office until her wound is healed. Making great progress. She agrees to a telephone visit with Dr. Benay Spice. Scheduling message sent.

## 2020-08-08 DIAGNOSIS — C189 Malignant neoplasm of colon, unspecified: Secondary | ICD-10-CM | POA: Diagnosis not present

## 2020-08-08 DIAGNOSIS — Z48815 Encounter for surgical aftercare following surgery on the digestive system: Secondary | ICD-10-CM | POA: Diagnosis not present

## 2020-08-08 DIAGNOSIS — C787 Secondary malignant neoplasm of liver and intrahepatic bile duct: Secondary | ICD-10-CM | POA: Diagnosis not present

## 2020-08-08 DIAGNOSIS — I82220 Acute embolism and thrombosis of inferior vena cava: Secondary | ICD-10-CM | POA: Diagnosis not present

## 2020-08-08 DIAGNOSIS — M06041 Rheumatoid arthritis without rheumatoid factor, right hand: Secondary | ICD-10-CM | POA: Diagnosis not present

## 2020-08-08 DIAGNOSIS — Z4801 Encounter for change or removal of surgical wound dressing: Secondary | ICD-10-CM | POA: Diagnosis not present

## 2020-08-14 DIAGNOSIS — C787 Secondary malignant neoplasm of liver and intrahepatic bile duct: Secondary | ICD-10-CM | POA: Diagnosis not present

## 2020-08-14 DIAGNOSIS — M06041 Rheumatoid arthritis without rheumatoid factor, right hand: Secondary | ICD-10-CM | POA: Diagnosis not present

## 2020-08-14 DIAGNOSIS — I82220 Acute embolism and thrombosis of inferior vena cava: Secondary | ICD-10-CM | POA: Diagnosis not present

## 2020-08-14 DIAGNOSIS — C189 Malignant neoplasm of colon, unspecified: Secondary | ICD-10-CM | POA: Diagnosis not present

## 2020-08-14 DIAGNOSIS — Z4801 Encounter for change or removal of surgical wound dressing: Secondary | ICD-10-CM | POA: Diagnosis not present

## 2020-08-14 DIAGNOSIS — Z48815 Encounter for surgical aftercare following surgery on the digestive system: Secondary | ICD-10-CM | POA: Diagnosis not present

## 2020-08-18 ENCOUNTER — Telehealth: Payer: Self-pay | Admitting: Oncology

## 2020-08-21 ENCOUNTER — Inpatient Hospital Stay: Payer: Medicare Other | Attending: Oncology | Admitting: Oncology

## 2020-08-21 DIAGNOSIS — M06041 Rheumatoid arthritis without rheumatoid factor, right hand: Secondary | ICD-10-CM | POA: Diagnosis not present

## 2020-08-21 DIAGNOSIS — C189 Malignant neoplasm of colon, unspecified: Secondary | ICD-10-CM | POA: Diagnosis not present

## 2020-08-21 DIAGNOSIS — Z48815 Encounter for surgical aftercare following surgery on the digestive system: Secondary | ICD-10-CM | POA: Diagnosis not present

## 2020-08-21 DIAGNOSIS — C787 Secondary malignant neoplasm of liver and intrahepatic bile duct: Secondary | ICD-10-CM

## 2020-08-21 DIAGNOSIS — C7951 Secondary malignant neoplasm of bone: Secondary | ICD-10-CM | POA: Insufficient documentation

## 2020-08-21 DIAGNOSIS — I82220 Acute embolism and thrombosis of inferior vena cava: Secondary | ICD-10-CM | POA: Diagnosis not present

## 2020-08-21 DIAGNOSIS — Z4801 Encounter for change or removal of surgical wound dressing: Secondary | ICD-10-CM | POA: Diagnosis not present

## 2020-08-21 NOTE — Progress Notes (Signed)
[place] OFFICE VISIT PROGRESS NOTE  I connected with on 08/21/20 at  8:30 AM EDT by telephone and verified that I am speaking with the correct person using two identifiers.   I discussed the limitations, risks, security and privacy concerns of performing an evaluation and management service by telemedicine and the availability of in-person appointments. I also discussed with the patient that there may be a patient responsible charge related to this service. The patient expressed understanding and agreed to proceed.  Other persons participating in the visit and their role in the encounter: Husband  Patient's location: Home Provider's location: Office   Diagnosis: Colon cancer  INTERVAL HISTORY:   Ms. Barrilleaux seen today for a telehealth visit per her request.  She was last seen by [place] in April when she was admitted with a small bowel obstruction.  She reports the wound VAC has been removed and the abdominal wound is healing.  She feels well.  Good appetite.  No difficulty with bowel function.  No nausea.  She takes ibuprofen occasionally for pain.  She is not taking oxycodone.  She continues to be visited by home health nurse.  The Port-A-Cath has not been flushed since she was discharged from the hospital.  Objective:   Lab Results:  Lab Results  Component Value Date   WBC 10.8 (H) 05/02/2020   HGB 9.1 (L) 05/02/2020   HCT 28.4 (L) 05/02/2020   MCV 102.2 (H) 05/02/2020   PLT 426 (H) 05/02/2020   NEUTROABS 10.7 (H) 04/24/2020    Medications: I have reviewed the patient's current medications.  Assessment/Plan: 1. Metastatic colon cancer presenting with an Abdominal/pelvic mass  2. [place]-pathology report dated 02/15/2015-invasive adenocarcinoma, well-differentiated with prominent mucinous features; microscopic tumor extension into mesenteric adipose tissue and focally involving visceral  peritoneum; radial and mucosal margins negative; lymph-vascular invasion withsuspicious foci identified;perineural invasion not identified; 16 negative lymph nodes; tumor deposits not identified; [id number];MSI stable  ? CT 04/22/2017 revealed a heterogenous anterior pelvic wall mass, right inguinal mass, top normal sized retroperitoneal and iliac nodes, abutment of the anterior dome of the bladder and a loop of small bowel ? 04/29/2017 biopsy of anterior abdominal/pelvic wall mass-acellular mucin dissecting fibrous stroma ? Cycle 1 FOLFOX 05/15/2017 ? Cycle 2 FOLFOX 05/28/2017 ? Cycle 3 FOLFOX with Avastin on 06/11/2017 ? Cycle 4 FOLFOX/Avastin 06/25/2017 ? Cycle 5 FOLFOX/Avastin 07/10/2017, Neulasta added ? Restaging CT 07/21/2017-mild decrease in the size of a right inguinal node and the anterior abdominal wall mass ? Radiation to the abdominal wall mass 08/18/2017-09/03/2017 ? Status post excision of abdominal wall mass 10/03/2017--pathology showed adenocarcinoma with excessive extracellular mucin. Margins negative for tumor. Right inguinal wall mass also excised showing metastatic adenocarcinoma with excessive extracellular mucin in 1 of 8 lymph nodes. ? CT abdomen/pelvis 06/16/2019-new right hepatic lobe lesion measuring 2.7 x 1.7 cm. Hypoattenuation within the pancreatic uncinate process measuring 1.5 x 1.6 cm. Mild left adrenal thickening. Upper pole left renal mass 3.0 x 3.6 cm. Infiltrative hypoenhancement involving the lower pole left kidney. Nonocclusive thrombus within the infrarenal IVC. Necrotic adenopathy within the small bowel mesentery, new. New necrotic left inguinal nodes. Lytic lesion within and adjacent the anterior side of the L3 vertebral body. Contiguous mass within the left psoas measuring 4.6 x 3.4 cm. ? Cycle 1 FOLFIRI 06/23/2019 ? Cycle 2 FOLFIRI 07/08/2019, Udenyca added ? Cycle 3 FOLFIRI 07/22/2019 ? Cycle 4 FOLFIRI 08/04/2019 ? Cycle 5 FOLFIRI 08/18/2019 ?  CTs  09/08/2019-3.4 x 2.9 cm metastasis in segment 7 liver, previously 2.7 x 1.7 cm. 14 mm lesion/implant along the inferior right hepatic lobe, possibly new or possibly related to the prior peritoneal implant along the right paracolic gutter. 4 x 6 mm subpleural nodule right lower lobe not clearly evident on the prior study. Near complete resolution of prior heterogeneous enhancement involving the bilateral kidneys which likely reflected pyelonephritis. Near complete resolution of prior lymphadenopathy along with small bowel mesentery. Mild left inguinal lymphadenopathy measuring up to 14 mm, previously 15 mm. Lytic metastasis at L3 with associated soft tissue metastasis involving the left psoas muscle improved. ? Cycle 6 FOLFIRI 09/16/2019, Udenyca ? Cycle 7 FOLFIRI 09/30/2019 ? Treatment break ? Cycle 8 FOLFIRI 01/06/2020 ? Cycle 9 FOLFIRI 01/26/2020 ? Cycle 10 FOLFIRI 02/24/2020 ? Cycle 11 FOLFIRI 03/16/2020 ? CT abdomen/pelvis 04/11/2020-posterior right liver lesion slightly larger, a second lesion in the inferior hepatic lobe is no longer present, portacaval nodes have decreased in size, slight decrease in size of left inguinal node, decreased L3 paraspinal mass   3. Ongoing tobacco and alcohol use  4. Mild neutropenia related to chemotherapy 07/10/2017. Neulasta added.  5.Admission January 2019 with an abdominal wall abscess and pubis osteomyelitis, status post debridement and antibiotics  6.Pain secondary to #1. 7.CT 06/16/2019 with nonocclusive thrombus infrarenal IVC. Anticoagulation initiated. 8.Left patella fracture following a fall 08/20/2019 9.  Admission 04/11/2020 with a small bowel obstruction, exploratory laparotomy with repair of multiple serosal tears and small bowel resection 04/19/2020, closure of abdomen with application of wound VAC 04/21/2020, pathology from small bowel resection 04/19/2020-acute serositis    Disposition: [name] has metastatic  colon cancer.  She was last treated with FOLFIRI on 03/16/2020.  She was admitted in April with a small bowel obstruction secondary to adhesions.  She underwent a small bowel resection.  The pathology revealed serositis and no malignancy.  [name] feels well at present.  She stated that she does not wish to resume chemotherapy.  She understands the metastatic colon cancer will progress at some point.  She agrees to a restaging CT evaluation and office visit in approximately 1 month.  She will agree to a Port-A-Cath flush and lab visit this week.   I discussed the assessment and treatment plan with the patient. The patient was provided an opportunity to ask questions and all were answered. The patient agreed with the plan and demonstrated an understanding of the instructions.   The patient was advised to call back or seek an in-person evaluation if the symptoms worsen or if the condition fails to improve as anticipated.  I provided 25 minutes of chart review, documentation, and telephone time during this encounter, and > 50% was spent counseling as documented under my assessment & plan.  Betsy Coder ANP/GNP-BC   08/21/2020 8:07 AM

## 2020-08-22 ENCOUNTER — Telehealth: Payer: Self-pay | Admitting: *Deleted

## 2020-08-22 ENCOUNTER — Telehealth: Payer: Self-pay | Admitting: Oncology

## 2020-08-22 NOTE — Telephone Encounter (Signed)
Scheduled appointments per 8/23 los. Patient is aware of appointment dates and times.

## 2020-08-22 NOTE — Telephone Encounter (Signed)
Called that radiology says she needs to drink the oral contrast for her CT scan and she does not want to drink the barium or the water based contrast, saying "I just don't think I can keep it down". Per Dr. Benay Spice, [place] to do CT without oral contrast. Note placed on radiology encounter

## 2020-08-24 ENCOUNTER — Inpatient Hospital Stay: Payer: Medicare Other

## 2020-08-24 ENCOUNTER — Other Ambulatory Visit: Payer: Self-pay

## 2020-08-24 DIAGNOSIS — C189 Malignant neoplasm of colon, unspecified: Secondary | ICD-10-CM

## 2020-08-24 DIAGNOSIS — C7951 Secondary malignant neoplasm of bone: Secondary | ICD-10-CM | POA: Diagnosis not present

## 2020-08-24 DIAGNOSIS — C787 Secondary malignant neoplasm of liver and intrahepatic bile duct: Secondary | ICD-10-CM | POA: Diagnosis not present

## 2020-08-24 DIAGNOSIS — Z95828 Presence of other vascular implants and grafts: Secondary | ICD-10-CM

## 2020-08-24 LAB — CMP (CANCER CENTER ONLY)
ALT: 24 U/L (ref 0–44)
AST: 22 U/L (ref 15–41)
Albumin: 4 g/dL (ref 3.5–5.0)
Alkaline Phosphatase: 116 U/L (ref 38–126)
Anion gap: 8 (ref 5–15)
BUN: 13 mg/dL (ref 8–23)
CO2: 24 mmol/L (ref 22–32)
Calcium: 9.6 mg/dL (ref 8.9–10.3)
Chloride: 106 mmol/L (ref 98–111)
Creatinine: 0.72 mg/dL (ref 0.44–1.00)
GFR, Est AFR Am: >60 mL/min (ref >60)
GFR, Estimated: >60 mL/min (ref >60)
Glucose, Bld: 90 mg/dL (ref 70–99)
Potassium: 3.9 mmol/L (ref 3.5–5.1)
Sodium: 138 mmol/L (ref 135–145)
Total Bilirubin: 0.4 mg/dL (ref 0.3–1.2)
Total Protein: 7.4 g/dL (ref 6.5–8.1)

## 2020-08-24 LAB — CBC WITH DIFFERENTIAL (CANCER CENTER ONLY)
Abs Immature Granulocytes: 0.02 10*3/uL (ref 0.00–0.07)
Basophils Absolute: 0.1 10*3/uL (ref 0.0–0.1)
Basophils Relative: 1 %
Eosinophils Absolute: 1.4 10*3/uL — ABNORMAL HIGH (ref 0.0–0.5)
Eosinophils Relative: 18 %
HCT: 45.7 % (ref 36.0–46.0)
Hemoglobin: 15 g/dL (ref 12.0–15.0)
Immature Granulocytes: 0 %
Lymphocytes Relative: 28 %
Lymphs Abs: 2.2 10*3/uL (ref 0.7–4.0)
MCH: 30.5 pg (ref 26.0–34.0)
MCHC: 32.8 g/dL (ref 30.0–36.0)
MCV: 93.1 fL (ref 80.0–100.0)
Monocytes Absolute: 0.5 10*3/uL (ref 0.1–1.0)
Monocytes Relative: 6 %
Neutro Abs: 3.6 10*3/uL (ref 1.7–7.7)
Neutrophils Relative %: 47 %
Platelet Count: 164 10*3/uL (ref 150–400)
RBC: 4.91 MIL/uL (ref 3.87–5.11)
RDW: 16.3 % — ABNORMAL HIGH (ref 11.5–15.5)
WBC Count: 7.8 10*3/uL (ref 4.0–10.5)
nRBC: 0 % (ref 0.0–0.2)

## 2020-08-24 LAB — CEA (IN HOUSE-CHCC): CEA (CHCC-In House): 146.5 ng/mL — ABNORMAL HIGH (ref 0.00–5.00)

## 2020-08-24 MED ORDER — SODIUM CHLORIDE 0.9% FLUSH
10.0000 mL | INTRAVENOUS | Status: DC | PRN
  Administered 2020-08-24: 10 mL via INTRAVENOUS
  Filled 2020-08-24: qty 10

## 2020-08-24 MED ORDER — HEPARIN SOD (PORK) LOCK FLUSH 100 UNIT/ML IV SOLN
500.0000 [IU] | Freq: Once | INTRAVENOUS | Status: AC | PRN
  Administered 2020-08-24: 500 [IU] via INTRAVENOUS
  Filled 2020-08-24: qty 5

## 2020-08-25 ENCOUNTER — Telehealth: Payer: Self-pay

## 2020-08-25 NOTE — Telephone Encounter (Signed)
Called spoke with pt confirmed she will follow up with ct scan and appt with lisa as well as update on labs pt understands encouraged to call with any questions concerns or changes

## 2020-08-25 NOTE — Telephone Encounter (Signed)
-----   Message from Ladell Pier, MD sent at 08/24/2020  1:57 PM EDT ----- Please call patient, CEA is higher, I strongly recommend she proceed with the restaging CTs as the cancer is likely progressing  I recommend resuming treatment as soon if she will agree to the CT scans and an office visit

## 2020-08-28 DIAGNOSIS — Z4801 Encounter for change or removal of surgical wound dressing: Secondary | ICD-10-CM | POA: Diagnosis not present

## 2020-08-28 DIAGNOSIS — M06041 Rheumatoid arthritis without rheumatoid factor, right hand: Secondary | ICD-10-CM | POA: Diagnosis not present

## 2020-08-28 DIAGNOSIS — I82220 Acute embolism and thrombosis of inferior vena cava: Secondary | ICD-10-CM | POA: Diagnosis not present

## 2020-08-28 DIAGNOSIS — C189 Malignant neoplasm of colon, unspecified: Secondary | ICD-10-CM | POA: Diagnosis not present

## 2020-08-28 DIAGNOSIS — C787 Secondary malignant neoplasm of liver and intrahepatic bile duct: Secondary | ICD-10-CM | POA: Diagnosis not present

## 2020-08-28 DIAGNOSIS — Z48815 Encounter for surgical aftercare following surgery on the digestive system: Secondary | ICD-10-CM | POA: Diagnosis not present

## 2020-09-02 DIAGNOSIS — C787 Secondary malignant neoplasm of liver and intrahepatic bile duct: Secondary | ICD-10-CM | POA: Diagnosis not present

## 2020-09-02 DIAGNOSIS — M06041 Rheumatoid arthritis without rheumatoid factor, right hand: Secondary | ICD-10-CM | POA: Diagnosis not present

## 2020-09-02 DIAGNOSIS — I82220 Acute embolism and thrombosis of inferior vena cava: Secondary | ICD-10-CM | POA: Diagnosis not present

## 2020-09-02 DIAGNOSIS — F17210 Nicotine dependence, cigarettes, uncomplicated: Secondary | ICD-10-CM | POA: Diagnosis not present

## 2020-09-02 DIAGNOSIS — Z4801 Encounter for change or removal of surgical wound dressing: Secondary | ICD-10-CM | POA: Diagnosis not present

## 2020-09-02 DIAGNOSIS — M6281 Muscle weakness (generalized): Secondary | ICD-10-CM | POA: Diagnosis not present

## 2020-09-02 DIAGNOSIS — R2681 Unsteadiness on feet: Secondary | ICD-10-CM | POA: Diagnosis not present

## 2020-09-02 DIAGNOSIS — Z9221 Personal history of antineoplastic chemotherapy: Secondary | ICD-10-CM | POA: Diagnosis not present

## 2020-09-02 DIAGNOSIS — M06042 Rheumatoid arthritis without rheumatoid factor, left hand: Secondary | ICD-10-CM | POA: Diagnosis not present

## 2020-09-02 DIAGNOSIS — C189 Malignant neoplasm of colon, unspecified: Secondary | ICD-10-CM | POA: Diagnosis not present

## 2020-09-02 DIAGNOSIS — Z7901 Long term (current) use of anticoagulants: Secondary | ICD-10-CM | POA: Diagnosis not present

## 2020-09-02 DIAGNOSIS — Z48815 Encounter for surgical aftercare following surgery on the digestive system: Secondary | ICD-10-CM | POA: Diagnosis not present

## 2020-09-06 ENCOUNTER — Encounter: Payer: Self-pay | Admitting: Nurse Practitioner

## 2020-09-06 NOTE — Progress Notes (Signed)
Patient called and left messages regarding assistance with [facility] bills for herself and spouse.   Called patient and asked if she has spoken with the billing department regarding options. She states she has and is doing the best she can. Advised patient if any programs were available to assist it would be through the billing department. She states they have applied for Medicaid and do not qualify.  She has my contact name and number for any additional financial questions or concerns.

## 2020-09-07 ENCOUNTER — Other Ambulatory Visit: Payer: Self-pay | Admitting: Oncology

## 2020-09-07 DIAGNOSIS — C189 Malignant neoplasm of colon, unspecified: Secondary | ICD-10-CM

## 2020-09-07 DIAGNOSIS — I82220 Acute embolism and thrombosis of inferior vena cava: Secondary | ICD-10-CM | POA: Diagnosis not present

## 2020-09-07 DIAGNOSIS — M06041 Rheumatoid arthritis without rheumatoid factor, right hand: Secondary | ICD-10-CM | POA: Diagnosis not present

## 2020-09-07 DIAGNOSIS — Z48815 Encounter for surgical aftercare following surgery on the digestive system: Secondary | ICD-10-CM | POA: Diagnosis not present

## 2020-09-07 DIAGNOSIS — C787 Secondary malignant neoplasm of liver and intrahepatic bile duct: Secondary | ICD-10-CM | POA: Diagnosis not present

## 2020-09-07 DIAGNOSIS — Z4801 Encounter for change or removal of surgical wound dressing: Secondary | ICD-10-CM | POA: Diagnosis not present

## 2020-09-07 MED FILL — XARELTO 20 MG TABLET: 20 | 30 days supply | Qty: 30 | Fill #0

## 2020-09-12 DIAGNOSIS — Z48815 Encounter for surgical aftercare following surgery on the digestive system: Secondary | ICD-10-CM | POA: Diagnosis not present

## 2020-09-12 DIAGNOSIS — C189 Malignant neoplasm of colon, unspecified: Secondary | ICD-10-CM | POA: Diagnosis not present

## 2020-09-12 DIAGNOSIS — C787 Secondary malignant neoplasm of liver and intrahepatic bile duct: Secondary | ICD-10-CM | POA: Diagnosis not present

## 2020-09-12 DIAGNOSIS — M06041 Rheumatoid arthritis without rheumatoid factor, right hand: Secondary | ICD-10-CM | POA: Diagnosis not present

## 2020-09-12 DIAGNOSIS — Z4801 Encounter for change or removal of surgical wound dressing: Secondary | ICD-10-CM | POA: Diagnosis not present

## 2020-09-12 DIAGNOSIS — I82220 Acute embolism and thrombosis of inferior vena cava: Secondary | ICD-10-CM | POA: Diagnosis not present

## 2020-09-18 DIAGNOSIS — Z48815 Encounter for surgical aftercare following surgery on the digestive system: Secondary | ICD-10-CM | POA: Diagnosis not present

## 2020-09-18 DIAGNOSIS — C189 Malignant neoplasm of colon, unspecified: Secondary | ICD-10-CM | POA: Diagnosis not present

## 2020-09-18 DIAGNOSIS — Z4801 Encounter for change or removal of surgical wound dressing: Secondary | ICD-10-CM | POA: Diagnosis not present

## 2020-09-18 DIAGNOSIS — I82220 Acute embolism and thrombosis of inferior vena cava: Secondary | ICD-10-CM | POA: Diagnosis not present

## 2020-09-18 DIAGNOSIS — C787 Secondary malignant neoplasm of liver and intrahepatic bile duct: Secondary | ICD-10-CM | POA: Diagnosis not present

## 2020-09-18 DIAGNOSIS — M06041 Rheumatoid arthritis without rheumatoid factor, right hand: Secondary | ICD-10-CM | POA: Diagnosis not present

## 2020-09-25 ENCOUNTER — Encounter (HOSPITAL_COMMUNITY): Payer: Self-pay

## 2020-09-25 ENCOUNTER — Other Ambulatory Visit: Payer: Self-pay

## 2020-09-25 ENCOUNTER — Telehealth: Payer: Self-pay | Admitting: *Deleted

## 2020-09-25 ENCOUNTER — Inpatient Hospital Stay: Payer: Medicare Other | Attending: Oncology | Admitting: Oncology

## 2020-09-25 ENCOUNTER — Ambulatory Visit (HOSPITAL_COMMUNITY)
Admission: RE | Admit: 2020-09-25 | Discharge: 2020-09-25 | Disposition: A | Discharge status: Home / Self Care | Payer: Medicare Other | Source: Ambulatory Visit | Attending: Oncology | Admitting: Oncology

## 2020-09-25 DIAGNOSIS — I700 Atherosclerosis of aorta: Secondary | ICD-10-CM | POA: Diagnosis not present

## 2020-09-25 DIAGNOSIS — D7389 Other diseases of spleen: Secondary | ICD-10-CM | POA: Diagnosis not present

## 2020-09-25 DIAGNOSIS — C787 Secondary malignant neoplasm of liver and intrahepatic bile duct: Secondary | ICD-10-CM | POA: Diagnosis not present

## 2020-09-25 DIAGNOSIS — C189 Malignant neoplasm of colon, unspecified: Secondary | ICD-10-CM | POA: Insufficient documentation

## 2020-09-25 DIAGNOSIS — D271 Benign neoplasm of left ovary: Secondary | ICD-10-CM | POA: Diagnosis not present

## 2020-09-25 DIAGNOSIS — K769 Liver disease, unspecified: Secondary | ICD-10-CM | POA: Insufficient documentation

## 2020-09-25 DIAGNOSIS — C7989 Secondary malignant neoplasm of other specified sites: Secondary | ICD-10-CM | POA: Diagnosis not present

## 2020-09-25 DIAGNOSIS — Z9221 Personal history of antineoplastic chemotherapy: Secondary | ICD-10-CM | POA: Diagnosis not present

## 2020-09-25 DIAGNOSIS — R918 Other nonspecific abnormal finding of lung field: Secondary | ICD-10-CM | POA: Diagnosis not present

## 2020-09-25 DIAGNOSIS — Z79899 Other long term (current) drug therapy: Secondary | ICD-10-CM | POA: Diagnosis not present

## 2020-09-25 LAB — POCT I-STAT CREATININE: Creatinine, Ser: 0.7 mg/dL (ref 0.44–1.00)

## 2020-09-25 MED ORDER — HEPARIN SOD (PORK) LOCK FLUSH 100 UNIT/ML IV SOLN
INTRAVENOUS | Status: AC
  Filled 2020-09-25: qty 5

## 2020-09-25 MED ORDER — IOHEXOL 300 MG/ML  SOLN
75.0000 mL | Freq: Once | INTRAMUSCULAR | Status: AC | PRN
  Administered 2020-09-25: 75 mL via INTRAVENOUS

## 2020-09-25 MED ORDER — HEPARIN SOD (PORK) LOCK FLUSH 100 UNIT/ML IV SOLN
500.0000 [IU] | Freq: Once | INTRAVENOUS | Status: AC
  Administered 2020-09-25: 500 [IU] via INTRAVENOUS

## 2020-09-25 NOTE — Progress Notes (Addendum)
[place] OFFICE VISIT PROGRESS NOTE  I connected with Yolanda Watson on 09/25/20 at  3:30 PM EDT by telephone and verified that I am speaking with the correct person using two identifiers.   I discussed the limitations, risks, security and privacy concerns of performing an evaluation and management service by telemedicine and the availability of in-person appointments. I also discussed with the patient that there may be a patient responsible charge related to this service. The patient expressed understanding and agreed to proceed.  Other persons participating in the visit and their role in the encounter: Husband  Patient's location: Home Provider's location: Office    Diagnosis: Colon cancer  INTERVAL HISTORY:   Yolanda Watson is seen today for a telephone visit per Yolanda Watson request.  Yolanda Watson reports feeling well.  No pain.  Good appetite. No complaint.  Yolanda Watson continues Xarelto anticoagulation.  Lab Results:  Lab Results  Component Value Date   WBC 7.8 08/24/2020   HGB 15.0 08/24/2020   HCT 45.7 08/24/2020   MCV 93.1 08/24/2020   PLT 164 08/24/2020   NEUTROABS 3.6 08/24/2020    Imaging:  CT CHEST W CONTRAST  Result Date: 09/25/2020 CLINICAL DATA:  Metastatic colon cancer restaging, chemotherapy and XRT complete EXAM: CT CHEST, ABDOMEN, AND PELVIS WITH CONTRAST TECHNIQUE: Multidetector CT imaging of the chest, abdomen and pelvis was performed following the standard protocol during bolus administration of intravenous contrast. CONTRAST:  29m OMNIPAQUE IOHEXOL 300 MG/ML SOLN, additional oral enteric contrast COMPARISON:  04/11/2020 FINDINGS: CT CHEST FINDINGS Cardiovascular: Right chest port catheter. Aortic atherosclerosis. Normal heart size. No pericardial effusion. Mediastinum/Nodes: Numerous calcified mediastinal and hilar lymph nodes. Thyroid gland, trachea, and esophagus demonstrate no significant findings. Lungs/Pleura: There are  innumerable tiny centrilobular and ground-glass nodules of the lungs, predominantly concentrated in the lung apices. No pleural effusion or pneumothorax. Musculoskeletal: No chest wall mass or suspicious bone lesions identified. CT ABDOMEN PELVIS FINDINGS Hepatobiliary: Significant interval enlargement of a hypodense mass of the right lobe of the liver, measuring at approximately 9.0 x 6.0 cm, previously 3.9 x 3.5 cm when measured similarly. No gallstones, gallbladder wall thickening, or biliary dilatation. Pancreas: Unremarkable. No pancreatic ductal dilatation or surrounding inflammatory changes. Spleen: Numerous splenic calcifications in keeping with prior granulomatous infection. Adrenals/Urinary Tract: Adrenal glands are unremarkable. Kidneys are normal, without renal calculi, solid lesion, or hydronephrosis. Bladder is unremarkable. Stomach/Bowel: Stomach is within normal limits. Interval postoperative findings of small bowel resection and reanastomosis. No evidence of bowel wall thickening, distention, or inflammatory changes. Vascular/Lymphatic: Aortic atherosclerosis. Unchanged prominent, nonspecific subcentimeter mesenteric lymph nodes (series 2, image 93). 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 Other: No abdominal wall hernia or abnormality. No abdominopelvic ascites. There is a new 5 mm soft tissue nodule of the midline ventral peritoneum or omentum (series 2, image 82). Musculoskeletal: No significant change in a lytic lesion of the L3 vertebral body (series 5, image 108). IMPRESSION: 1. Significant interval enlargement of a hypodense mass of the right lobe of the liver, consistent with worsened hepatic metastatic disease. 2. There is a new 5 mm soft tissue nodule of the midline ventral peritoneum or omentum, suspicious for new metastatic disease. Attention on follow-up. 3. No significant change in a lytic lesion of the L3 vertebral body. 4. There are innumerable  tiny centrilobular and ground-glass nodules of the lungs, predominantly concentrated in the lung apices. Findings are nonspecific and likely infectious or inflammatory, most commonly seen in  smoking-related respiratory bronchiolitis. Metastatic disease not favored. Attention on follow-up. 5. Interval postoperative findings of small bowel resection and reanastomosis. 6. Aortic Atherosclerosis (ICD10-I70.0). Electronically Signed   By: Eddie Candle M.D.   On: 09/25/2020 12:40   CT ABDOMEN PELVIS W CONTRAST  Result Date: 09/25/2020 CLINICAL DATA:  Metastatic colon cancer restaging, chemotherapy and XRT complete EXAM: CT CHEST, ABDOMEN, AND PELVIS WITH CONTRAST TECHNIQUE: Multidetector CT imaging of the chest, abdomen and pelvis was performed following the standard protocol during bolus administration of intravenous contrast. CONTRAST:  55m OMNIPAQUE IOHEXOL 300 MG/ML SOLN, additional oral enteric contrast COMPARISON:  04/11/2020 FINDINGS: CT CHEST FINDINGS Cardiovascular: Right chest port catheter. Aortic atherosclerosis. Normal heart size. No pericardial effusion. Mediastinum/Nodes: Numerous calcified mediastinal and hilar lymph nodes. Thyroid gland, trachea, and esophagus demonstrate no significant findings. Lungs/Pleura: There are innumerable tiny centrilobular and ground-glass nodules of the lungs, predominantly concentrated in the lung apices. No pleural effusion or pneumothorax. Musculoskeletal: No chest wall mass or suspicious bone lesions identified. CT ABDOMEN PELVIS FINDINGS Hepatobiliary: Significant interval enlargement of a hypodense mass of the right lobe of the liver, measuring at approximately 9.0 x 6.0 cm, previously 3.9 x 3.5 cm when measured similarly. No gallstones, gallbladder wall thickening, or biliary dilatation. Pancreas: Unremarkable. No pancreatic ductal dilatation or surrounding inflammatory changes. Spleen: Numerous splenic calcifications in keeping with prior granulomatous infection.  Adrenals/Urinary Tract: Adrenal glands are unremarkable. Kidneys are normal, without renal calculi, solid lesion, or hydronephrosis. Bladder is unremarkable. Stomach/Bowel: Stomach is within normal limits. Interval postoperative findings of small bowel resection and reanastomosis. No evidence of bowel wall thickening, distention, or inflammatory changes. Vascular/Lymphatic: Aortic atherosclerosis. Unchanged prominent, nonspecific subcentimeter mesenteric lymph nodes (series 2, image 93). 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 Other: No abdominal wall hernia or abnormality. No abdominopelvic ascites. There is a new 5 mm soft tissue nodule of the midline ventral peritoneum or omentum (series 2, image 82). Musculoskeletal: No significant change in a lytic lesion of the L3 vertebral body (series 5, image 108). IMPRESSION: 1. Significant interval enlargement of a hypodense mass of the right lobe of the liver, consistent with worsened hepatic metastatic disease. 2. There is a new 5 mm soft tissue nodule of the midline ventral peritoneum or omentum, suspicious for new metastatic disease. Attention on follow-up. 3. No significant change in a lytic lesion of the L3 vertebral body. 4. There are innumerable tiny centrilobular and ground-glass nodules of the lungs, predominantly concentrated in the lung apices. Findings are nonspecific and likely infectious or inflammatory, most commonly seen in smoking-related respiratory bronchiolitis. Metastatic disease not favored. Attention on follow-up. 5. Interval postoperative findings of small bowel resection and reanastomosis. 6. Aortic Atherosclerosis (ICD10-I70.0). Electronically Signed   By: AEddie CandleM.D.   On: 09/25/2020 12:40    Medications: I have reviewed the patient's current medications.  Assessment/Plan: 1. Metastatic colon cancer presenting with an Abdominal/pelvic mass  [place]. [place]-invasive adenocarcinoma, well-differentiated with prominent mucinous features; microscopic tumor extension into mesenteric adipose tissue and focally involving visceral peritoneum; radial and mucosal margins negative; lymph-vascular invasion withsuspicious foci identified;perineural invasion not identified; 16 negative lymph nodes; tumor deposits not identified; pT4apN0;MSI stable,BRAF V600E, ATM, MSS, TMB indeterminant-8  ? CT 04/22/2017 revealed a heterogenous anterior pelvic wall mass, right inguinal mass, top normal sized retroperitoneal and iliac nodes, abutment of the anterior dome of the bladder and a loop of small bowel ? 04/29/2017 biopsy of anterior abdominal/pelvic wall  mass-acellular mucin dissecting fibrous stroma ? Cycle 1 FOLFOX 05/15/2017 ? Cycle 2 FOLFOX 05/28/2017 ? Cycle 3 FOLFOX with Avastin on 06/11/2017 ? Cycle 4 FOLFOX/Avastin 06/25/2017 ? Cycle 5 FOLFOX/Avastin 07/10/2017, Neulasta added ? Restaging CT 07/21/2017-mild decrease in the size of a right inguinal node and the anterior abdominal wall mass ? Radiation to the abdominal wall mass 08/18/2017-09/03/2017 ? Status post excision of abdominal wall mass 10/03/2017--pathology showed adenocarcinoma with excessive extracellular mucin. Margins negative for tumor. Right inguinal wall mass also excised showing metastatic adenocarcinoma with excessive extracellular mucin in 1 of 8 lymph nodes. ? CT abdomen/pelvis 06/16/2019-new right hepatic lobe lesion measuring 2.7 x 1.7 cm. Hypoattenuation within the pancreatic uncinate process measuring 1.5 x 1.6 cm. Mild left adrenal thickening. Upper pole left renal mass 3.0 x 3.6 cm. Infiltrative hypoenhancement involving the lower pole left kidney. Nonocclusive thrombus within the infrarenal IVC. Necrotic adenopathy within the small bowel mesentery, new. New necrotic left inguinal nodes. Lytic lesion within and adjacent the anterior side of the  L3 vertebral body. Contiguous mass within the left psoas measuring 4.6 x 3.4 cm. ? Cycle 1 FOLFIRI 06/23/2019 ? Cycle 2 FOLFIRI 07/08/2019, Udenyca added ? Cycle 3 FOLFIRI 07/22/2019 ? Cycle 4 FOLFIRI 08/04/2019 ? Cycle 5 FOLFIRI 08/18/2019 ? CTs 09/08/2019-3.4 x 2.9 cm metastasis in segment 7 liver, previously 2.7 x 1.7 cm. 14 mm lesion/implant along the inferior right hepatic lobe, possibly new or possibly related to the prior peritoneal implant along the right paracolic gutter. 4 x 6 mm subpleural nodule right lower lobe not clearly evident on the prior study. Near complete resolution of prior heterogeneous enhancement involving the bilateral kidneys which likely reflected pyelonephritis. Near complete resolution of prior lymphadenopathy along with small bowel mesentery. Mild left inguinal lymphadenopathy measuring up to 14 mm, previously 15 mm. Lytic metastasis at L3 with associated soft tissue metastasis involving the left psoas muscle improved. ? Cycle 6 FOLFIRI 09/16/2019, Udenyca ? Cycle 7 FOLFIRI 09/30/2019 ? Treatment break ? Cycle 8 FOLFIRI 01/06/2020 ? Cycle 9 FOLFIRI 01/26/2020 ? Cycle 10 FOLFIRI 02/24/2020 ? Cycle 11 FOLFIRI 03/16/2020 ? CT abdomen/pelvis 04/11/2020-posterior right liver lesion slightly larger, a second lesion in the inferior hepatic lobe is no longer present, portacaval nodes have decreased in size, slight decrease in size of left inguinal node, decreased L3 paraspinal mass ? CTs 09/25/2020-interval enlargement of a right liver mass, new 5 mm soft tissue nodule at the midline ventral peritoneum, no change in lytic L3 lesion, innumerable tiny lung nodules-likely inflammatory   3. Ongoing tobacco and alcohol use  4. Mild neutropenia related to chemotherapy 07/10/2017. Neulasta added.  5.Admission January 2019 with an abdominal wall abscess and pubis osteomyelitis, status post debridement and antibiotics  6.Pain secondary to #1. 7.CT 06/16/2019  with nonocclusive thrombus infrarenal IVC. Anticoagulation initiated. 8.Left patella fracture following a fall 08/20/2019 9.  Admission 04/11/2020 with a small bowel obstruction, exploratory laparotomy with repair of multiple serosal tears and small bowel resection 04/19/2020, closure of abdomen with application of wound VAC 04/21/2020, pathology from small bowel resection 04/19/2020-acute serositis    Disposition: Yolanda Watson has a history of metastatic colon cancer.  Yolanda Watson has been maintained off of systemic therapy since March of this year.  The CEA was higher on 08/24/2020.  The restaging CTs today are consistent with progressive disease involving the liver and potentially a peritoneal nodule.  I reviewed the CT images.  I discussed the findings and treatment options with Yolanda Watson. I recommend Yolanda Watson resume FOLFIRI chemotherapy.  Yolanda Watson is in agreement.  The plan is to begin FOLFIRI 10/05/2020.  We will check a CEA that day.  A chemotherapy plan was entered.  I discussed the assessment and treatment plan with the patient. The patient was provided an opportunity to ask questions and all were answered. The patient agreed with the plan and demonstrated an understanding of the instructions.   The patient was advised to call back or seek an in-person evaluation if the symptoms worsen or if the condition fails to improve as anticipated.  I provided 35 minutes of chart review, telephone, and documentation time during this encounter, and > 50% was spent counseling as documented under my assessment & plan.  Betsy Coder ANP/GNP-BC   09/25/2020 4:10 PM

## 2020-09-25 NOTE — Telephone Encounter (Signed)
Left VM requesting a change to her appointment tomorrow to go over scans as a telephone visit. States she does not need to see the images. Notified Ned Card, NP of her request.

## 2020-09-26 ENCOUNTER — Inpatient Hospital Stay: Payer: Medicare Other | Admitting: Nurse Practitioner

## 2020-09-26 ENCOUNTER — Telehealth: Payer: Self-pay | Admitting: Oncology

## 2020-09-26 NOTE — Telephone Encounter (Signed)
Scheduled appointments per 9/27 los. Called patient and spoke to patient who informed me she would not like to schedule any more appointments until the beginning of next year. She sited personal reasons for this decision. I have cancelled the appointments amde on 10/7 per patient request. I also sent a message to the provider letting them know patient's decision.

## 2020-09-27 ENCOUNTER — Telehealth: Payer: Self-pay | Admitting: *Deleted

## 2020-09-27 ENCOUNTER — Telehealth: Payer: Self-pay | Admitting: Oncology

## 2020-09-27 NOTE — Telephone Encounter (Signed)
Scheduled appointments per 9/29 scheduling message and 9/27 los. Spoke to patient who is aware of appointments dates and times.

## 2020-09-27 NOTE — Telephone Encounter (Signed)
Called patient because she had told scheduler that she wants to wait to resume treatment after 1st of the year. MD concerned and wants to see her to discuss again. Patient has now agreed to have treatment on 10/7 and 10/21. Sent high priority message to scheduler to see if she can be put back on the schedule and to call patient to confirm. Patient agrees to two treatments in October and then wants to wait till January to resume. MD will discuss this at visit on 10/7.

## 2020-10-05 ENCOUNTER — Ambulatory Visit: Payer: Medicare Other | Admitting: Nurse Practitioner

## 2020-10-05 ENCOUNTER — Other Ambulatory Visit: Payer: Medicare Other

## 2020-10-05 ENCOUNTER — Inpatient Hospital Stay: Payer: Medicare Other

## 2020-10-05 ENCOUNTER — Inpatient Hospital Stay: Payer: Medicare Other | Attending: Oncology

## 2020-10-05 ENCOUNTER — Ambulatory Visit: Payer: Medicare Other

## 2020-10-05 ENCOUNTER — Inpatient Hospital Stay (HOSPITAL_BASED_OUTPATIENT_CLINIC_OR_DEPARTMENT_OTHER): Payer: Medicare Other | Admitting: Nurse Practitioner

## 2020-10-05 ENCOUNTER — Encounter: Payer: Self-pay | Admitting: Nurse Practitioner

## 2020-10-05 ENCOUNTER — Other Ambulatory Visit: Payer: Self-pay

## 2020-10-05 VITALS — BP 140/77 | HR 82 | Temp 97.1°F | Resp 18 | Wt 103.1 lb

## 2020-10-05 DIAGNOSIS — C787 Secondary malignant neoplasm of liver and intrahepatic bile duct: Secondary | ICD-10-CM

## 2020-10-05 DIAGNOSIS — Z95828 Presence of other vascular implants and grafts: Secondary | ICD-10-CM

## 2020-10-05 DIAGNOSIS — C189 Malignant neoplasm of colon, unspecified: Secondary | ICD-10-CM | POA: Diagnosis not present

## 2020-10-05 DIAGNOSIS — Z79899 Other long term (current) drug therapy: Secondary | ICD-10-CM | POA: Diagnosis not present

## 2020-10-05 DIAGNOSIS — Z5111 Encounter for antineoplastic chemotherapy: Secondary | ICD-10-CM | POA: Insufficient documentation

## 2020-10-05 DIAGNOSIS — Z923 Personal history of irradiation: Secondary | ICD-10-CM | POA: Diagnosis not present

## 2020-10-05 LAB — CBC WITH DIFFERENTIAL (CANCER CENTER ONLY)
Abs Immature Granulocytes: 0.01 10*3/uL (ref 0.00–0.07)
Basophils Absolute: 0.1 10*3/uL (ref 0.0–0.1)
Basophils Relative: 1 %
Eosinophils Absolute: 1.6 10*3/uL — ABNORMAL HIGH (ref 0.0–0.5)
Eosinophils Relative: 20 %
HCT: 43.2 % (ref 36.0–46.0)
Hemoglobin: 14.7 g/dL (ref 12.0–15.0)
Immature Granulocytes: 0 %
Lymphocytes Relative: 22 %
Lymphs Abs: 1.8 10*3/uL (ref 0.7–4.0)
MCH: 31.7 pg (ref 26.0–34.0)
MCHC: 34 g/dL (ref 30.0–36.0)
MCV: 93.3 fL (ref 80.0–100.0)
Monocytes Absolute: 0.5 10*3/uL (ref 0.1–1.0)
Monocytes Relative: 6 %
Neutro Abs: 4.2 10*3/uL (ref 1.7–7.7)
Neutrophils Relative %: 51 %
Platelet Count: 166 10*3/uL (ref 150–400)
RBC: 4.63 MIL/uL (ref 3.87–5.11)
RDW: 14.2 % (ref 11.5–15.5)
WBC Count: 8.2 10*3/uL (ref 4.0–10.5)
nRBC: 0 % (ref 0.0–0.2)

## 2020-10-05 LAB — CMP (CANCER CENTER ONLY)
ALT: 23 U/L (ref 0–44)
AST: 20 U/L (ref 15–41)
Albumin: 3.9 g/dL (ref 3.5–5.0)
Alkaline Phosphatase: 114 U/L (ref 38–126)
Anion gap: 6 (ref 5–15)
BUN: 14 mg/dL (ref 8–23)
CO2: 28 mmol/L (ref 22–32)
Calcium: 9.2 mg/dL (ref 8.9–10.3)
Chloride: 104 mmol/L (ref 98–111)
Creatinine: 0.75 mg/dL (ref 0.44–1.00)
GFR, Estimated: >60 mL/min (ref >60)
Glucose, Bld: 103 mg/dL — ABNORMAL HIGH (ref 70–99)
Potassium: 3.8 mmol/L (ref 3.5–5.1)
Sodium: 138 mmol/L (ref 135–145)
Total Bilirubin: 0.6 mg/dL (ref 0.3–1.2)
Total Protein: 7.3 g/dL (ref 6.5–8.1)

## 2020-10-05 LAB — CEA (IN HOUSE-CHCC): CEA (CHCC-In House): 348.33 ng/mL — ABNORMAL HIGH (ref 0.00–5.00)

## 2020-10-05 MED ORDER — ATROPINE SULFATE 0.4 MG/ML IJ SOLN: 0.4000 mg | Freq: Once | INTRAMUSCULAR | Status: DC | PRN

## 2020-10-05 MED ORDER — PALONOSETRON HCL INJECTION 0.25 MG/5ML
0.2500 mg | Freq: Once | INTRAVENOUS | Status: AC
  Administered 2020-10-05: 0.25 mg via INTRAVENOUS

## 2020-10-05 MED ORDER — SODIUM CHLORIDE 0.9 % IV SOLN
Freq: Once | INTRAVENOUS | Status: AC
  Filled 2020-10-05: qty 250

## 2020-10-05 MED ORDER — FLUOROURACIL CHEMO INJECTION 2.5 GM/50ML
400.0000 mg/m2 | Freq: Once | INTRAVENOUS | Status: AC
  Administered 2020-10-05: 600 mg via INTRAVENOUS
  Filled 2020-10-05: qty 12

## 2020-10-05 MED ORDER — SODIUM CHLORIDE 0.9 % IV SOLN
10.0000 mg | Freq: Once | INTRAVENOUS | Status: AC
  Administered 2020-10-05: 10 mg via INTRAVENOUS
  Filled 2020-10-05: qty 1
  Filled 2020-10-05: qty 10

## 2020-10-05 MED ORDER — SODIUM CHLORIDE 0.9% FLUSH
10.0000 mL | INTRAVENOUS | Status: DC | PRN
  Administered 2020-10-05: 10 mL via INTRAVENOUS
  Filled 2020-10-05: qty 10

## 2020-10-05 MED ORDER — SODIUM CHLORIDE 0.9 % IV SOLN
2400.0000 mg/m2 | INTRAVENOUS | Status: DC
  Administered 2020-10-05: 3550 mg via INTRAVENOUS
  Filled 2020-10-05: qty 71

## 2020-10-05 MED ORDER — IRINOTECAN HCL CHEMO INJECTION 100 MG/5ML
180.0000 mg/m2 | Freq: Once | INTRAVENOUS | Status: AC
  Administered 2020-10-05: 260 mg via INTRAVENOUS
  Filled 2020-10-05: qty 13

## 2020-10-05 MED ORDER — LEUCOVORIN CALCIUM INJECTION 350 MG
400.0000 mg/m2 | Freq: Once | INTRAVENOUS | Status: AC
  Administered 2020-10-05: 588 mg via INTRAVENOUS
  Filled 2020-10-05: qty 29.4

## 2020-10-05 MED ORDER — PALONOSETRON HCL INJECTION 0.25 MG/5ML
INTRAVENOUS | Status: AC
  Filled 2020-10-05: qty 5

## 2020-10-05 NOTE — Progress Notes (Addendum)
[facility] [facility] OFFICE PROGRESS NOTE   Diagnosis: Colon cancer  INTERVAL HISTORY:   Yolanda Watson returns for follow-up. She feels well. She has a good appetite. She denies pain. No nausea or vomiting. Bowels are moving. No bleeding.  Objective:  Vital signs in last 24 hours:  Blood pressure 140/77, pulse 82, temperature (!) 97.1 F (36.2 C), temperature source Tympanic, resp. rate 18, weight 103 lb 1.6 oz (46.8 kg), SpO2 100 %.   Lymphatics: Firm approximate 2 cm nodule left inguinal region. Resp: Lungs clear bilaterally. Cardio: Regular rate and rhythm. GI: No hepatomegaly.  Vascular: No leg edema. Neuro: Alert and oriented. Skin: Lower portion of midline scar has superficial wound/scab. Port-A-Cath without erythema.   Lab Results:  Lab Results  Component Value Date   WBC 8.2 10/05/2020   HGB 14.7 10/05/2020   HCT 43.2 10/05/2020   MCV 93.3 10/05/2020   PLT 166 10/05/2020   NEUTROABS 4.2 10/05/2020    Imaging:  No results found.  Medications: I have reviewed the patient's current medications.  Assessment/Plan: 1. Metastatic colon cancer presenting with an Abdominal/pelvic mass  2. [place]pathology [place] dated 02/15/2015-invasive adenocarcinoma, well-differentiated with prominent mucinous features; microscopic tumor extension into mesenteric adipose tissue and focally involving visceral peritoneum; radial and mucosal margins negative; lymph-vascular invasion withsuspicious foci identified;perineural invasion not identified; 16 negative lymph nodes; tumor deposits not identified; pT4apN0;MSI stable,BRAF V600E, ATM, MSS, TMB indeterminant-8  ? CT 04/22/2017 revealed a heterogenous anterior pelvic wall mass, right inguinal mass, top normal sized retroperitoneal and iliac nodes, abutment of the anterior dome of the bladder and a loop of small bowel ? 04/29/2017 biopsy of anterior abdominal/pelvic wall mass-acellular  mucin dissecting fibrous stroma ? Cycle 1 FOLFOX 05/15/2017 ? Cycle 2 FOLFOX 05/28/2017 ? Cycle 3 FOLFOX with Avastin on 06/11/2017 ? Cycle 4 FOLFOX/Avastin 06/25/2017 ? Cycle 5 FOLFOX/Avastin 07/10/2017, Neulasta added ? Restaging CT 07/21/2017-mild decrease in the size of a right inguinal node and the anterior abdominal wall mass ? Radiation to the abdominal wall mass 08/18/2017-09/03/2017 ? Status post excision of abdominal wall mass 10/03/2017--pathology showed adenocarcinoma with excessive extracellular mucin. Margins negative for tumor. Right inguinal wall mass also excised showing metastatic adenocarcinoma with excessive extracellular mucin in 1 of 8 lymph nodes. ? CT abdomen/pelvis 06/16/2019-new right hepatic lobe lesion measuring 2.7 x 1.7 cm. Hypoattenuation within the pancreatic uncinate process measuring 1.5 x 1.6 cm. Mild left adrenal thickening. Upper pole left renal mass 3.0 x 3.6 cm. Infiltrative hypoenhancement involving the lower pole left kidney. Nonocclusive thrombus within the infrarenal IVC. Necrotic adenopathy within the small bowel mesentery, new. New necrotic left inguinal nodes. Lytic lesion within and adjacent the anterior side of the L3 vertebral body. Contiguous mass within the left psoas measuring 4.6 x 3.4 cm. ? Cycle 1 FOLFIRI 06/23/2019 ? Cycle 2 FOLFIRI 07/08/2019, Udenyca added ? Cycle 3 FOLFIRI 07/22/2019 ? Cycle 4 FOLFIRI 08/04/2019 ? Cycle 5 FOLFIRI 08/18/2019 ? CTs 09/08/2019-3.4 x 2.9 cm metastasis in segment 7 liver, previously 2.7 x 1.7 cm. 14 mm lesion/implant along the inferior right hepatic lobe, possibly new or possibly related to the prior peritoneal implant along the right paracolic gutter. 4 x 6 mm subpleural nodule right lower lobe not clearly evident on the prior study. Near complete resolution of prior heterogeneous enhancement involving the bilateral kidneys which likely reflected pyelonephritis. Near complete resolution of prior  lymphadenopathy along with small bowel mesentery. Mild left inguinal lymphadenopathy measuring up to 14 mm, previously  15 mm. Lytic metastasis at L3 with associated soft tissue metastasis involving the left psoas muscle improved. ? Cycle 6 FOLFIRI 09/16/2019, Udenyca ? Cycle 7 FOLFIRI 09/30/2019 ? Treatment break ? Cycle 8 FOLFIRI 01/06/2020 ? Cycle 9 FOLFIRI 01/26/2020 ? Cycle 10 FOLFIRI 02/24/2020 ? Cycle 11 FOLFIRI 03/16/2020 ? CT abdomen/pelvis 04/11/2020-posterior right liver lesion slightly larger, a second lesion in the inferior hepatic lobe is no longer present, portacaval nodes have decreased in size, slight decrease in size of left inguinal node, decreased L3 paraspinal mass ? CTs 09/25/2020-interval enlargement of a right liver mass, new 5 mm soft tissue nodule at the midline ventral peritoneum, no change in lytic L3 lesion, innumerable tiny lung nodules-likely inflammatory ? Cycle 1 FOLFIRI 10/05/2020   3. Ongoing tobacco and alcohol use  4. Mild neutropenia related to chemotherapy 07/10/2017. Neulasta added.  5.Admission January 2019 with an abdominal wall abscess and pubis osteomyelitis, status post debridement and antibiotics  6.Pain secondary to #1. 7.CT 06/16/2019 with nonocclusive thrombus infrarenal IVC. Anticoagulation initiated. 8.Left patella fracture following a fall 08/20/2019 9.  Admission 04/11/2020 with a small bowel obstruction, exploratory laparotomy with repair of multiple serosal tears and small bowel resection 04/19/2020, closure of abdomen with application of wound VAC 04/21/2020, pathology from small bowel resection 04/19/2020-acute serositis    Disposition: [name] appears stable.  Dr. Benay Spice recommends she resume FOLFIRI chemotherapy based on the recent CT scan showing evidence of disease progression.  We reviewed the potential toxicities.  She agrees to proceed.    We reviewed the CBC from today.  Counts adequate for  treatment.  She will return for lab, follow-up, cycle 2 FOLFIRI in 2 weeks.  She will contact the office in the interim with any problems.  Patient seen with Dr. Benay Spice.      Ned Card ANP/GNP-BC   10/05/2020  9:01 AM  Ms. Klang was interviewed and examined. The restaging CT confirmed disease progression. She agrees to resume treatment with FOLFIRI. She will be a candidate for encorafenib/Panitumumab in the future.  Julieanne Manson, MD

## 2020-10-05 NOTE — Patient Instructions (Signed)
[facility] Discharge Instructions for Patients Receiving Chemotherapy  Today you received the following chemotherapy agents: Irinotecan (Camptosar), Leucovorin, Fluorouracil (Adrucil, 5-FU)  To help prevent nausea and vomiting after your treatment, we encourage you to take your nausea medication as directed by your provider.    If you develop nausea and vomiting that is not controlled by your nausea medication, call the clinic.   BELOW ARE SYMPTOMS THAT SHOULD BE REPORTED IMMEDIATELY:  *FEVER GREATER THAN 100.5 F  *CHILLS WITH OR WITHOUT FEVER  NAUSEA AND VOMITING THAT IS NOT CONTROLLED WITH YOUR NAUSEA MEDICATION  *UNUSUAL SHORTNESS OF BREATH  *UNUSUAL BRUISING OR BLEEDING  TENDERNESS IN MOUTH AND THROAT WITH OR WITHOUT PRESENCE OF ULCERS  *URINARY PROBLEMS  *BOWEL PROBLEMS  UNUSUAL RASH Items with * indicate a potential emergency and should be followed up as soon as possible.  Feel free to call the clinic should you have any questions or concerns. The clinic phone number is [phone].  Please show the [facility] at check-in to the Emergency Department and triage nurse.

## 2020-10-06 ENCOUNTER — Telehealth: Payer: Self-pay

## 2020-10-06 ENCOUNTER — Telehealth: Payer: Self-pay | Admitting: Nurse Practitioner

## 2020-10-06 NOTE — Telephone Encounter (Signed)
Called patient to schedule appointments. Patient said she did not want to scheduled appointment because of the holidays coming up. I sent a message to the provider letting them know of the patient's decision and why appointment was not scheduled.

## 2020-10-06 NOTE — Telephone Encounter (Signed)
Returned call to patient's husbands. States they talked about having treatment on 10/19/20 and no further treatment until after the first of the year. Advised will leave a message for Dr Benay Spice and will return phone call once we have spoken with Dr Benay Spice.

## 2020-10-07 ENCOUNTER — Inpatient Hospital Stay: Payer: Medicare Other

## 2020-10-07 ENCOUNTER — Other Ambulatory Visit: Payer: Self-pay

## 2020-10-07 VITALS — BP 154/72 | HR 79 | Temp 97.8°F | Resp 20

## 2020-10-07 DIAGNOSIS — M6281 Muscle weakness (generalized): Secondary | ICD-10-CM | POA: Insufficient documentation

## 2020-10-07 DIAGNOSIS — Z5111 Encounter for antineoplastic chemotherapy: Secondary | ICD-10-CM | POA: Diagnosis not present

## 2020-10-07 DIAGNOSIS — C189 Malignant neoplasm of colon, unspecified: Secondary | ICD-10-CM | POA: Diagnosis not present

## 2020-10-07 DIAGNOSIS — M24549 Contracture, unspecified hand: Secondary | ICD-10-CM | POA: Insufficient documentation

## 2020-10-07 DIAGNOSIS — Z720 Tobacco use: Secondary | ICD-10-CM | POA: Insufficient documentation

## 2020-10-07 DIAGNOSIS — Z923 Personal history of irradiation: Secondary | ICD-10-CM | POA: Diagnosis not present

## 2020-10-07 DIAGNOSIS — F1010 Alcohol abuse, uncomplicated: Secondary | ICD-10-CM | POA: Insufficient documentation

## 2020-10-07 DIAGNOSIS — Z79899 Other long term (current) drug therapy: Secondary | ICD-10-CM | POA: Diagnosis not present

## 2020-10-07 DIAGNOSIS — F17200 Nicotine dependence, unspecified, uncomplicated: Secondary | ICD-10-CM | POA: Insufficient documentation

## 2020-10-07 DIAGNOSIS — T8149XA Infection following a procedure, other surgical site, initial encounter: Secondary | ICD-10-CM | POA: Insufficient documentation

## 2020-10-07 MED ORDER — PEGFILGRASTIM-CBQV 6 MG/0.6ML ~~LOC~~ SOSY
6.0000 mg | PREFILLED_SYRINGE | Freq: Once | SUBCUTANEOUS | Status: AC
  Administered 2020-10-07: 6 mg via SUBCUTANEOUS

## 2020-10-07 MED ORDER — SODIUM CHLORIDE 0.9% FLUSH
10.0000 mL | INTRAVENOUS | Status: DC | PRN
  Administered 2020-10-07: 10 mL
  Filled 2020-10-07: qty 10

## 2020-10-07 MED ORDER — HEPARIN SOD (PORK) LOCK FLUSH 100 UNIT/ML IV SOLN
500.0000 [IU] | Freq: Once | INTRAVENOUS | Status: AC | PRN
  Administered 2020-10-07: 500 [IU]
  Filled 2020-10-07: qty 5

## 2020-10-07 NOTE — Patient Instructions (Signed)

## 2020-10-13 ENCOUNTER — Other Ambulatory Visit: Payer: Self-pay | Admitting: Oncology

## 2020-10-13 MED FILL — XARELTO 20 MG TABLET: 20 | 30 days supply | Qty: 30 | Fill #1

## 2020-10-19 ENCOUNTER — Inpatient Hospital Stay: Payer: Medicare Other

## 2020-10-19 ENCOUNTER — Encounter: Payer: Self-pay | Admitting: Nurse Practitioner

## 2020-10-19 ENCOUNTER — Other Ambulatory Visit: Payer: Self-pay

## 2020-10-19 ENCOUNTER — Inpatient Hospital Stay (HOSPITAL_BASED_OUTPATIENT_CLINIC_OR_DEPARTMENT_OTHER): Payer: Medicare Other | Admitting: Nurse Practitioner

## 2020-10-19 VITALS — BP 132/77 | HR 81 | Temp 97.5°F | Resp 16 | Ht 66.0 in | Wt 102.9 lb

## 2020-10-19 DIAGNOSIS — C189 Malignant neoplasm of colon, unspecified: Secondary | ICD-10-CM

## 2020-10-19 DIAGNOSIS — C787 Secondary malignant neoplasm of liver and intrahepatic bile duct: Secondary | ICD-10-CM | POA: Diagnosis not present

## 2020-10-19 DIAGNOSIS — Z923 Personal history of irradiation: Secondary | ICD-10-CM | POA: Diagnosis not present

## 2020-10-19 DIAGNOSIS — Z95828 Presence of other vascular implants and grafts: Secondary | ICD-10-CM

## 2020-10-19 DIAGNOSIS — Z79899 Other long term (current) drug therapy: Secondary | ICD-10-CM | POA: Diagnosis not present

## 2020-10-19 DIAGNOSIS — Z5111 Encounter for antineoplastic chemotherapy: Secondary | ICD-10-CM | POA: Diagnosis not present

## 2020-10-19 LAB — CMP (CANCER CENTER ONLY)
ALT: 14 U/L (ref 0–44)
AST: 13 U/L — ABNORMAL LOW (ref 15–41)
Albumin: 3.8 g/dL (ref 3.5–5.0)
Alkaline Phosphatase: 158 U/L — ABNORMAL HIGH (ref 38–126)
Anion gap: 8 (ref 5–15)
BUN: 14 mg/dL (ref 8–23)
CO2: 26 mmol/L (ref 22–32)
Calcium: 8.9 mg/dL (ref 8.9–10.3)
Chloride: 105 mmol/L (ref 98–111)
Creatinine: 0.75 mg/dL (ref 0.44–1.00)
GFR, Estimated: >60 mL/min (ref >60)
Glucose, Bld: 114 mg/dL — ABNORMAL HIGH (ref 70–99)
Potassium: 3.6 mmol/L (ref 3.5–5.1)
Sodium: 139 mmol/L (ref 135–145)
Total Bilirubin: 0.4 mg/dL (ref 0.3–1.2)
Total Protein: 6.9 g/dL (ref 6.5–8.1)

## 2020-10-19 LAB — CBC WITH DIFFERENTIAL (CANCER CENTER ONLY)
Abs Immature Granulocytes: 0.23 10*3/uL — ABNORMAL HIGH (ref 0.00–0.07)
Basophils Absolute: 0.1 10*3/uL (ref 0.0–0.1)
Basophils Relative: 1 %
Eosinophils Absolute: 1.4 10*3/uL — ABNORMAL HIGH (ref 0.0–0.5)
Eosinophils Relative: 8 %
HCT: 40.6 % (ref 36.0–46.0)
Hemoglobin: 13.5 g/dL (ref 12.0–15.0)
Immature Granulocytes: 1 %
Lymphocytes Relative: 11 %
Lymphs Abs: 1.9 10*3/uL (ref 0.7–4.0)
MCH: 31.9 pg (ref 26.0–34.0)
MCHC: 33.3 g/dL (ref 30.0–36.0)
MCV: 96 fL (ref 80.0–100.0)
Monocytes Absolute: 0.9 10*3/uL (ref 0.1–1.0)
Monocytes Relative: 5 %
Neutro Abs: 12.9 10*3/uL — ABNORMAL HIGH (ref 1.7–7.7)
Neutrophils Relative %: 74 %
Platelet Count: 170 10*3/uL (ref 150–400)
RBC: 4.23 MIL/uL (ref 3.87–5.11)
RDW: 15 % (ref 11.5–15.5)
WBC Count: 17.5 10*3/uL — ABNORMAL HIGH (ref 4.0–10.5)
nRBC: 0 % (ref 0.0–0.2)

## 2020-10-19 LAB — CEA (IN HOUSE-CHCC): CEA (CHCC-In House): 280.42 ng/mL — ABNORMAL HIGH (ref 0.00–5.00)

## 2020-10-19 MED ORDER — LEUCOVORIN CALCIUM INJECTION 350 MG
400.0000 mg/m2 | Freq: Once | INTRAVENOUS | Status: AC
  Administered 2020-10-19: 588 mg via INTRAVENOUS
  Filled 2020-10-19: qty 29.4

## 2020-10-19 MED ORDER — HEPARIN SOD (PORK) LOCK FLUSH 100 UNIT/ML IV SOLN
500.0000 [IU] | Freq: Once | INTRAVENOUS | Status: DC | PRN
  Filled 2020-10-19: qty 5

## 2020-10-19 MED ORDER — PALONOSETRON HCL INJECTION 0.25 MG/5ML
INTRAVENOUS | Status: AC
  Filled 2020-10-19: qty 5

## 2020-10-19 MED ORDER — SODIUM CHLORIDE 0.9% FLUSH
10.0000 mL | INTRAVENOUS | Status: DC | PRN
  Filled 2020-10-19: qty 10

## 2020-10-19 MED ORDER — SODIUM CHLORIDE 0.9 % IV SOLN
Freq: Once | INTRAVENOUS | Status: AC
  Filled 2020-10-19: qty 250

## 2020-10-19 MED ORDER — PROCHLORPERAZINE MALEATE 10 MG PO TABS: 10.0000 mg | ORAL_TABLET | Freq: Four times a day (QID) | ORAL | 0 refills | Status: DC | PRN

## 2020-10-19 MED ORDER — PALONOSETRON HCL INJECTION 0.25 MG/5ML
0.2500 mg | Freq: Once | INTRAVENOUS | Status: AC
  Administered 2020-10-19: 0.25 mg via INTRAVENOUS

## 2020-10-19 MED ORDER — ATROPINE SULFATE 0.4 MG/ML IJ SOLN
INTRAMUSCULAR | Status: AC
  Filled 2020-10-19: qty 1

## 2020-10-19 MED ORDER — IRINOTECAN HCL CHEMO INJECTION 100 MG/5ML
180.0000 mg/m2 | Freq: Once | INTRAVENOUS | Status: AC
  Administered 2020-10-19: 260 mg via INTRAVENOUS
  Filled 2020-10-19: qty 13

## 2020-10-19 MED ORDER — SODIUM CHLORIDE 0.9% FLUSH
10.0000 mL | INTRAVENOUS | Status: DC | PRN
  Administered 2020-10-19: 10 mL via INTRAVENOUS
  Filled 2020-10-19: qty 10

## 2020-10-19 MED ORDER — SODIUM CHLORIDE 0.9 % IV SOLN
10.0000 mg | Freq: Once | INTRAVENOUS | Status: AC
  Administered 2020-10-19: 10 mg via INTRAVENOUS
  Filled 2020-10-19: qty 10

## 2020-10-19 MED ORDER — SODIUM CHLORIDE 0.9 % IV SOLN
2400.0000 mg/m2 | INTRAVENOUS | Status: DC
  Administered 2020-10-19: 3550 mg via INTRAVENOUS
  Filled 2020-10-19: qty 71

## 2020-10-19 MED ORDER — FLUOROURACIL CHEMO INJECTION 2.5 GM/50ML
400.0000 mg/m2 | Freq: Once | INTRAVENOUS | Status: AC
  Administered 2020-10-19: 600 mg via INTRAVENOUS
  Filled 2020-10-19: qty 12

## 2020-10-19 NOTE — Progress Notes (Signed)
Patient is not comfortable with COVID booster at this time.

## 2020-10-19 NOTE — Progress Notes (Addendum)
[place] OFFICE PROGRESS NOTE   Diagnosis:  Colon cancer  INTERVAL HISTORY:   Ms. Trombetta returns as scheduled.  She resumed treatment with FOLFIRI on 10/05/2020.  She denies nausea/vomiting.  No mouth sores.  No diarrhea.  She was fatigued for about 5 days.  She denies pain.  Objective:  Vital signs in last 24 hours:  Blood pressure 132/77, pulse 81, temperature (!) 97.5 F (36.4 C), temperature source Tympanic, resp. rate 16, height _0  (1.676 m), weight 102 lb 14.4 oz (46.7 kg), SpO2 100 %.    HEENT: No thrush or ulcers. Lymphatics: Firm 2 cm nodule left inguinal region. Resp: Lungs clear bilaterally. Cardio: Regular rate and rhythm. GI: Abdomen soft and nontender.  No hepatomegaly. Vascular: No leg edema. Skin: Partially scabbed superficial wound at the lower aspect of the midline scar. Port-A-Cath without erythema.   Lab Results:  Lab Results  Component Value Date   WBC 17.5 (H) 10/19/2020   HGB 13.5 10/19/2020   HCT 40.6 10/19/2020   MCV 96.0 10/19/2020   PLT 170 10/19/2020   NEUTROABS 12.9 (H) 10/19/2020    Imaging:  No results found.  Medications: I have reviewed the patient's current medications.  Assessment/Plan: 1. Metastatic colon cancer presenting with an Abdominal/pelvic mass  2. [place]osed in [place] in 2016-pathology report dated 02/15/2015-invasive adenocarcinoma, well-differentiated with prominent mucinous features; microscopic tumor extension into mesenteric adipose tissue and focally involving visceral peritoneum; radial and mucosal margins negative; lymph-vascular invasion withsuspicious foci identified;perineural invasion not identified; 16 negative lymph nodes; tumor deposits not identified; pT4apN0;MSI stable,BRAF V600E, ATM, MSS, TMB indeterminant-8, RAS wild-type  ? CT 04/22/2017 revealed a heterogenous anterior pelvic wall mass, right inguinal mass, top normal sized retroperitoneal and iliac nodes,  abutment of the anterior dome of the bladder and a loop of small bowel ? 04/29/2017 biopsy of anterior abdominal/pelvic wall mass-acellular mucin dissecting fibrous stroma ? Cycle 1 FOLFOX 05/15/2017 ? Cycle 2 FOLFOX 05/28/2017 ? Cycle 3 FOLFOX with Avastin on 06/11/2017 ? Cycle 4 FOLFOX/Avastin 06/25/2017 ? Cycle 5 FOLFOX/Avastin 07/10/2017, Neulasta added ? Restaging CT 07/21/2017-mild decrease in the size of a right inguinal node and the anterior abdominal wall mass ? Radiation to the abdominal wall mass 08/18/2017-09/03/2017 ? Status post excision of abdominal wall mass 10/03/2017--pathology showed adenocarcinoma with excessive extracellular mucin. Margins negative for tumor. Right inguinal wall mass also excised showing metastatic adenocarcinoma with excessive extracellular mucin in 1 of 8 lymph nodes. ? CT abdomen/pelvis 06/16/2019-new right hepatic lobe lesion measuring 2.7 x 1.7 cm. Hypoattenuation within the pancreatic uncinate process measuring 1.5 x 1.6 cm. Mild left adrenal thickening. Upper pole left renal mass 3.0 x 3.6 cm. Infiltrative hypoenhancement involving the lower pole left kidney. Nonocclusive thrombus within the infrarenal IVC. Necrotic adenopathy within the small bowel mesentery, new. New necrotic left inguinal nodes. Lytic lesion within and adjacent the anterior side of the L3 vertebral body. Contiguous mass within the left psoas measuring 4.6 x 3.4 cm. ? Cycle 1 FOLFIRI 06/23/2019 ? Cycle 2 FOLFIRI 07/08/2019, Udenyca added ? Cycle 3 FOLFIRI 07/22/2019 ? Cycle 4 FOLFIRI 08/04/2019 ? Cycle 5 FOLFIRI 08/18/2019 ? CTs 09/08/2019-3.4 x 2.9 cm metastasis in segment 7 liver, previously 2.7 x 1.7 cm. 14 mm lesion/implant along the inferior right hepatic lobe, possibly new or possibly related to the prior peritoneal implant along the right paracolic gutter. 4 x 6 mm subpleural nodule right lower lobe not clearly evident on the prior study. Near complete resolution of prior  heterogeneous  enhancement involving the bilateral kidneys which likely reflected pyelonephritis. Near complete resolution of prior lymphadenopathy along with small bowel mesentery. Mild left inguinal lymphadenopathy measuring up to 14 mm, previously 15 mm. Lytic metastasis at L3 with associated soft tissue metastasis involving the left psoas muscle improved. ? Cycle 6 FOLFIRI 09/16/2019, Udenyca ? Cycle 7 FOLFIRI 09/30/2019 ? Treatment break ? Cycle 8 FOLFIRI 01/06/2020 ? Cycle 9 FOLFIRI 01/26/2020 ? Cycle 10 FOLFIRI 02/24/2020 ? Cycle 11 FOLFIRI 03/16/2020 ? CT abdomen/pelvis 04/11/2020-posterior right liver lesion slightly larger, a second lesion in the inferior hepatic lobe is no longer present, portacaval nodes have decreased in size, slight decrease in size of left inguinal node, decreased L3 paraspinal mass ? CTs 09/25/2020-interval enlargement of a right liver mass, new 5 mm soft tissue nodule at the midline ventral peritoneum, no change in lytic L3 lesion, innumerable tiny lung nodules-likely inflammatory ? Cycle 1 FOLFIRI 10/05/2020 ? Cycle 2 FOLFIRI 10/19/2020   3. Ongoing tobacco and alcohol use  4. Mild neutropenia related to chemotherapy 07/10/2017. Neulasta added.  5.Admission January 2019 with an abdominal wall abscess and pubis osteomyelitis, status post debridement and antibiotics  6.Pain secondary to #1. 7.CT 06/16/2019 with nonocclusive thrombus infrarenal IVC. Anticoagulation initiated. 8.Left patella fracture following a fall 08/20/2019 9. Admission 04/11/2020 with a small bowel obstruction, exploratory laparotomy with repair of multiple serosal tears and small bowel resection 04/19/2020, closure of abdomen with application of wound VAC 04/21/2020, pathology from small bowel resection 04/19/2020-acute serositis   Disposition: Ms. Prosser appears stable.  She has completed 1 cycle of FOLFIRI.  She tolerated well.  Plan to proceed with cycle 2 today as  scheduled.   We reviewed the CBC and chemistry panel from today.  Labs adequate to proceed with treatment.  We will hold Udenyca with this cycle due to the elevated ANC.  She does not want to return for further follow-up/chemotherapy until January 05, 2020.  She understands I recommend she continue follow-up/FOLFIRI at 2-week intervals until restaging which would occur after she has completed 4-5 cycles.  She understands the cancer will most likely continue to progress during the time she is off of treatment.  I reviewed concerning symptoms such as anorexia/weight loss, persistent pain, jaundice, bowel issues, nausea/vomiting, worsening of the wound at the lower abdomen.  She agrees to contact the office if she develops any of the above or other concerning symptoms.  Appointments canceled 11/09/2020 at her request.  She will return for follow-up 01/04/2021.  We are available to see her sooner if she changes her mind regarding treatment or develops any problems.    Ned Card ANP/GNP-BC   10/19/2020  9:45 AM

## 2020-10-19 NOTE — Patient Instructions (Signed)
[facility] Discharge Instructions for Patients Receiving Chemotherapy  Today you received the following chemotherapy agents: Irinotecan, Leucovorin, and Fluorouracil  To help prevent nausea and vomiting after your treatment, we encourage you to take your nausea medication as directed by your MD.   If you develop nausea and vomiting that is not controlled by your nausea medication, call the clinic.   BELOW ARE SYMPTOMS THAT SHOULD BE REPORTED IMMEDIATELY:  *FEVER GREATER THAN 100.5 F  *CHILLS WITH OR WITHOUT FEVER  NAUSEA AND VOMITING THAT IS NOT CONTROLLED WITH YOUR NAUSEA MEDICATION  *UNUSUAL SHORTNESS OF BREATH  *UNUSUAL BRUISING OR BLEEDING  TENDERNESS IN MOUTH AND THROAT WITH OR WITHOUT PRESENCE OF ULCERS  *URINARY PROBLEMS  *BOWEL PROBLEMS  UNUSUAL RASH Items with * indicate a potential emergency and should be followed up as soon as possible.  Feel free to call the clinic should you have any questions or concerns. The clinic phone number is [phone].  Please show the [facility] at check-in to the Emergency Department and triage nurse.  [place] Discharge Instructions for Patients receiving Home Portable Chemo Pump    **The bag should finish at 46 hours, 96 hours or 7 days. For example, if your pump is scheduled for 46 hours and it was put on at 4pm, it should finish at 2 pm the day it is scheduled to come off regardless of your appointment time.    Estimated time to finish   _________________________ (Have your nurse fill in)     ** if the display on your pump reads "Low Volume" and it is beeping, take the batteries out of the pump and come to the cancer center for it to be taken off.   **If the pump alarms go off prior to the pump reading "Low Volume" then call the [phone] and someone can assist you.  **If the plunger comes out and the bag fluid is running out, please use your chemo spill kit to clean up the  spill. Do not use paper towels or other house hold products.  ** If you have problems or questions regarding your pump, please call either the [phone] or the cancer center Monday-Friday 8:00am-4:30pm at [phone] and we will assist you.  If you are unable to get assistance then go to [facility] Emergency Room, ask the staff to contact the IV team for assistance.

## 2020-10-19 NOTE — Progress Notes (Signed)
[place] return noted before, during, and after Fluorouracil IV Injection.

## 2020-10-20 ENCOUNTER — Telehealth: Payer: Self-pay | Admitting: Nurse Practitioner

## 2020-10-20 NOTE — Telephone Encounter (Signed)
Scheduled appointments per 10/21 los. I am mailing an updated calendar to patient with appointment sin January.

## 2020-10-21 ENCOUNTER — Inpatient Hospital Stay: Payer: Medicare Other

## 2020-10-21 ENCOUNTER — Other Ambulatory Visit: Payer: Self-pay

## 2020-10-21 VITALS — BP 119/58 | HR 79 | Temp 97.2°F | Resp 18

## 2020-10-21 DIAGNOSIS — Z79899 Other long term (current) drug therapy: Secondary | ICD-10-CM | POA: Diagnosis not present

## 2020-10-21 DIAGNOSIS — C787 Secondary malignant neoplasm of liver and intrahepatic bile duct: Secondary | ICD-10-CM

## 2020-10-21 DIAGNOSIS — C189 Malignant neoplasm of colon, unspecified: Secondary | ICD-10-CM

## 2020-10-21 DIAGNOSIS — Z923 Personal history of irradiation: Secondary | ICD-10-CM | POA: Diagnosis not present

## 2020-10-21 DIAGNOSIS — Z5111 Encounter for antineoplastic chemotherapy: Secondary | ICD-10-CM | POA: Diagnosis not present

## 2020-10-21 MED ORDER — SODIUM CHLORIDE 0.9% FLUSH
10.0000 mL | INTRAVENOUS | Status: DC | PRN
  Administered 2020-10-21: 10 mL
  Filled 2020-10-21: qty 10

## 2020-10-21 MED ORDER — HEPARIN SOD (PORK) LOCK FLUSH 100 UNIT/ML IV SOLN
500.0000 [IU] | Freq: Once | INTRAVENOUS | Status: AC | PRN
  Administered 2020-10-21: 500 [IU]
  Filled 2020-10-21: qty 5

## 2020-11-09 ENCOUNTER — Ambulatory Visit: Payer: Medicare Other

## 2020-11-09 ENCOUNTER — Other Ambulatory Visit: Payer: Medicare Other

## 2020-11-09 ENCOUNTER — Ambulatory Visit: Payer: Medicare Other | Admitting: Nurse Practitioner

## 2020-11-09 MED FILL — XARELTO 20 MG TABLET: 20 | 30 days supply | Qty: 30 | Fill #2

## 2020-12-11 ENCOUNTER — Other Ambulatory Visit: Payer: Self-pay | Admitting: Oncology

## 2020-12-11 DIAGNOSIS — C787 Secondary malignant neoplasm of liver and intrahepatic bile duct: Secondary | ICD-10-CM

## 2020-12-11 DIAGNOSIS — C189 Malignant neoplasm of colon, unspecified: Secondary | ICD-10-CM

## 2020-12-11 MED FILL — XARELTO 20 MG TABLET: 20 | 30 days supply | Qty: 30 | Fill #0

## 2020-12-29 ENCOUNTER — Other Ambulatory Visit: Payer: Self-pay | Admitting: Oncology

## 2021-01-02 ENCOUNTER — Telehealth: Payer: Self-pay | Admitting: *Deleted

## 2021-01-02 NOTE — Telephone Encounter (Signed)
Left VM cancelling her appointments on 01/04/21. Attempted to return call with no answer or voice mail at home or mobile #.

## 2021-01-04 ENCOUNTER — Inpatient Hospital Stay: Payer: Medicare Other

## 2021-01-04 ENCOUNTER — Inpatient Hospital Stay: Payer: Medicare Other | Admitting: Oncology

## 2021-01-10 MED FILL — XARELTO 20 MG TABLET: 20 | 30 days supply | Qty: 30 | Fill #1

## 2021-01-22 ENCOUNTER — Telehealth: Payer: Self-pay

## 2021-01-22 NOTE — Telephone Encounter (Signed)
Returned call to pt . Pt wanting to reschedule 01/04/21 missed appointments. Message sent to scheduling.

## 2021-01-23 ENCOUNTER — Telehealth: Payer: Self-pay

## 2021-01-23 NOTE — Telephone Encounter (Signed)
Returned call to pt. Assured pt scheduling would contact her to get her scheduled for her next appointments.

## 2021-01-24 ENCOUNTER — Telehealth: Payer: Self-pay

## 2021-01-24 NOTE — Telephone Encounter (Signed)
Pt contacted to let her know at her next appointment she would not have treatment it would be labs and MD appointment only. Pt verbalized understanding.

## 2021-01-25 ENCOUNTER — Telehealth: Payer: Self-pay | Admitting: Nurse Practitioner

## 2021-01-25 ENCOUNTER — Telehealth: Payer: Self-pay | Admitting: *Deleted

## 2021-01-25 ENCOUNTER — Other Ambulatory Visit: Payer: Self-pay

## 2021-01-25 DIAGNOSIS — C787 Secondary malignant neoplasm of liver and intrahepatic bile duct: Secondary | ICD-10-CM

## 2021-01-25 DIAGNOSIS — C189 Malignant neoplasm of colon, unspecified: Secondary | ICD-10-CM

## 2021-01-25 NOTE — Telephone Encounter (Signed)
Added lab and port flush to upcoming appointment per 1/27 schedule message. Patient is aware of changes.

## 2021-01-25 NOTE — Telephone Encounter (Signed)
Called patient to strongly encourage her to come and be seen before March 2022 (she is waiting for weather to be warmer due to not liking the cold). Explained that Dr. Benay Spice is concerned about and that her cancer most likely has progressed since she had last tx on 10/19/20. Reminded her that the cancer can take her life is she does not continue treatment. She may need to restage with CT scan. She agrees to come to office next week for lab/flush and see NP/MD. Scheduled OV and sent message to scheduler to put her in for lab/flush prior and call patient.

## 2021-01-29 ENCOUNTER — Other Ambulatory Visit: Payer: Medicare Other

## 2021-01-29 ENCOUNTER — Ambulatory Visit: Payer: Medicare Other | Admitting: Nurse Practitioner

## 2021-01-30 ENCOUNTER — Ambulatory Visit: Payer: Medicare Other

## 2021-02-01 ENCOUNTER — Inpatient Hospital Stay: Payer: Medicare Other | Attending: Nurse Practitioner | Admitting: Nurse Practitioner

## 2021-02-01 ENCOUNTER — Other Ambulatory Visit: Payer: Self-pay

## 2021-02-01 ENCOUNTER — Inpatient Hospital Stay: Payer: Medicare Other

## 2021-02-01 ENCOUNTER — Encounter: Payer: Self-pay | Admitting: Nurse Practitioner

## 2021-02-01 ENCOUNTER — Other Ambulatory Visit: Payer: Medicare Other

## 2021-02-01 VITALS — BP 137/73 | HR 84 | Temp 97.7°F | Resp 16 | Ht 66.0 in | Wt 107.1 lb

## 2021-02-01 DIAGNOSIS — D701 Agranulocytosis secondary to cancer chemotherapy: Secondary | ICD-10-CM | POA: Diagnosis not present

## 2021-02-01 DIAGNOSIS — C787 Secondary malignant neoplasm of liver and intrahepatic bile duct: Secondary | ICD-10-CM | POA: Diagnosis not present

## 2021-02-01 DIAGNOSIS — C189 Malignant neoplasm of colon, unspecified: Secondary | ICD-10-CM | POA: Insufficient documentation

## 2021-02-01 DIAGNOSIS — Z7289 Other problems related to lifestyle: Secondary | ICD-10-CM | POA: Insufficient documentation

## 2021-02-01 DIAGNOSIS — F17210 Nicotine dependence, cigarettes, uncomplicated: Secondary | ICD-10-CM | POA: Insufficient documentation

## 2021-02-01 DIAGNOSIS — G893 Neoplasm related pain (acute) (chronic): Secondary | ICD-10-CM | POA: Insufficient documentation

## 2021-02-01 DIAGNOSIS — Z5111 Encounter for antineoplastic chemotherapy: Secondary | ICD-10-CM | POA: Diagnosis not present

## 2021-02-01 DIAGNOSIS — C778 Secondary and unspecified malignant neoplasm of lymph nodes of multiple regions: Secondary | ICD-10-CM | POA: Diagnosis not present

## 2021-02-01 DIAGNOSIS — T451X5A Adverse effect of antineoplastic and immunosuppressive drugs, initial encounter: Secondary | ICD-10-CM | POA: Diagnosis not present

## 2021-02-01 DIAGNOSIS — Z95828 Presence of other vascular implants and grafts: Secondary | ICD-10-CM

## 2021-02-01 DIAGNOSIS — Z79899 Other long term (current) drug therapy: Secondary | ICD-10-CM | POA: Insufficient documentation

## 2021-02-01 LAB — CBC WITH DIFFERENTIAL (CANCER CENTER ONLY)
Abs Immature Granulocytes: 0.01 10*3/uL (ref 0.00–0.07)
Basophils Absolute: 0.1 10*3/uL (ref 0.0–0.1)
Basophils Relative: 1 %
Eosinophils Absolute: 3 10*3/uL — ABNORMAL HIGH (ref 0.0–0.5)
Eosinophils Relative: 30 %
HCT: 43.5 % (ref 36.0–46.0)
Hemoglobin: 14.6 g/dL (ref 12.0–15.0)
Immature Granulocytes: 0 %
Lymphocytes Relative: 23 %
Lymphs Abs: 2.3 10*3/uL (ref 0.7–4.0)
MCH: 32.8 pg (ref 26.0–34.0)
MCHC: 33.6 g/dL (ref 30.0–36.0)
MCV: 97.8 fL (ref 80.0–100.0)
Monocytes Absolute: 0.6 10*3/uL (ref 0.1–1.0)
Monocytes Relative: 6 %
Neutro Abs: 4 10*3/uL (ref 1.7–7.7)
Neutrophils Relative %: 40 %
Platelet Count: 169 10*3/uL (ref 150–400)
RBC: 4.45 MIL/uL (ref 3.87–5.11)
RDW: 12.4 % (ref 11.5–15.5)
WBC Count: 9.9 10*3/uL (ref 4.0–10.5)
nRBC: 0 % (ref 0.0–0.2)

## 2021-02-01 LAB — CMP (CANCER CENTER ONLY)
ALT: 28 U/L (ref 0–44)
AST: 27 U/L (ref 15–41)
Albumin: 4.1 g/dL (ref 3.5–5.0)
Alkaline Phosphatase: 161 U/L — ABNORMAL HIGH (ref 38–126)
Anion gap: 10 (ref 5–15)
BUN: 10 mg/dL (ref 8–23)
CO2: 23 mmol/L (ref 22–32)
Calcium: 9 mg/dL (ref 8.9–10.3)
Chloride: 107 mmol/L (ref 98–111)
Creatinine: 0.7 mg/dL (ref 0.44–1.00)
GFR, Estimated: >60 mL/min (ref >60)
Glucose, Bld: 80 mg/dL (ref 70–99)
Potassium: 4.1 mmol/L (ref 3.5–5.1)
Sodium: 140 mmol/L (ref 135–145)
Total Bilirubin: 0.4 mg/dL (ref 0.3–1.2)
Total Protein: 7.5 g/dL (ref 6.5–8.1)

## 2021-02-01 LAB — CEA (IN HOUSE-CHCC): CEA (CHCC-In House): 1135.14 ng/mL — ABNORMAL HIGH (ref 0.00–5.00)

## 2021-02-01 MED ORDER — HEPARIN SOD (PORK) LOCK FLUSH 100 UNIT/ML IV SOLN
500.0000 [IU] | Freq: Once | INTRAVENOUS | Status: AC | PRN
  Administered 2021-02-01: 500 [IU] via INTRAVENOUS
  Filled 2021-02-01: qty 5

## 2021-02-01 MED ORDER — SODIUM CHLORIDE 0.9% FLUSH
10.0000 mL | INTRAVENOUS | Status: DC | PRN
  Administered 2021-02-01: 10 mL via INTRAVENOUS
  Filled 2021-02-01: qty 10

## 2021-02-01 NOTE — Progress Notes (Signed)
[facility] OFFICE PROGRESS NOTE   Diagnosis: Colon cancer  INTERVAL HISTORY:   Yolanda Watson was last seen at the [facility] 10/19/2020. She completed a cycle of FOLFIRI 10/19/2020. She declined to return for further follow-up/chemotherapy until early 2022.  She thinks the nodule at the left groin is larger.  Otherwise she feels "fine".  No nausea or vomiting.  No diarrhea.  She denies pain.  Appetite is "very good".  She denies bleeding.  Objective:  Vital signs in last 24 hours:  Blood pressure 137/73, pulse 84, temperature 97.7 F (36.5 C), temperature source Tympanic, resp. rate 16, height 5\' 6"  (1.676 m), weight 107 lb 1.6 oz (48.6 kg), SpO2 97 %.    HEENT: No palpable cervical, supraclavicular, axillary or right inguinal lymph nodes. Lymphatics: Firm 3 cm left inguinal mass/lymph node. Resp: Lungs clear bilaterally. Cardio: Regular rate and rhythm. GI: No hepatomegaly. Vascular: No leg edema. Port-A-Cath without erythema.  Lab Results:  Lab Results  Component Value Date   WBC 17.5 (H) 10/19/2020   HGB 13.5 10/19/2020   HCT 40.6 10/19/2020   MCV 96.0 10/19/2020   PLT 170 10/19/2020   NEUTROABS 12.9 (H) 10/19/2020    Imaging:  No results found.  Medications: I have reviewed the patient's current medications.  Assessment/Plan: 1. Metastatic colon cancer presenting with an Abdominal/pelvic mass  2. Colon cancerdiagnosed in 10/21/[place]-pathology report dated 02/15/2015-invasive adenocarcinoma, well-differentiated with prominent mucinous features; microscopic tumor extension into mesenteric adipose tissue and focally involving visceral peritoneum; radial and mucosal margins negative; lymph-vascular invasion withsuspicious foci identified;perineural invasion not identified; 16 negative lymph nodes; tumor deposits not identified; pT4apN0;MSI stable,BRAF V600E, ATM, MSS, TMB indeterminant-8, RAS wild-type  ? CT 04/22/2017 revealed a  heterogenous anterior pelvic wall mass, right inguinal mass, top normal sized retroperitoneal and iliac nodes, abutment of the anterior dome of the bladder and a loop of small bowel ? 04/29/2017 biopsy of anterior abdominal/pelvic wall mass-acellular mucin dissecting fibrous stroma ? Cycle 1 FOLFOX 05/15/2017 ? Cycle 2 FOLFOX 05/28/2017 ? Cycle 3 FOLFOX with Avastin on 06/11/2017 ? Cycle 4 FOLFOX/Avastin 06/25/2017 ? Cycle 5 FOLFOX/Avastin 07/10/2017, Neulasta added ? Restaging CT 07/21/2017-mild decrease in the size of a right inguinal node and the anterior abdominal wall mass ? Radiation to the abdominal wall mass 08/18/2017-09/03/2017 ? Status post excision of abdominal wall mass 10/03/2017--pathology showed adenocarcinoma with excessive extracellular mucin. Margins negative for tumor. Right inguinal wall mass also excised showing metastatic adenocarcinoma with excessive extracellular mucin in 1 of 8 lymph nodes. ? CT abdomen/pelvis 06/16/2019-new right hepatic lobe lesion measuring 2.7 x 1.7 cm. Hypoattenuation within the pancreatic uncinate process measuring 1.5 x 1.6 cm. Mild left adrenal thickening. Upper pole left renal mass 3.0 x 3.6 cm. Infiltrative hypoenhancement involving the lower pole left kidney. Nonocclusive thrombus within the infrarenal IVC. Necrotic adenopathy within the small bowel mesentery, new. New necrotic left inguinal nodes. Lytic lesion within and adjacent the anterior side of the L3 vertebral body. Contiguous mass within the left psoas measuring 4.6 x 3.4 cm. ? Cycle 1 FOLFIRI 06/23/2019 ? Cycle 2 FOLFIRI 07/08/2019, Udenyca added ? Cycle 3 FOLFIRI 07/22/2019 ? Cycle 4 FOLFIRI 08/04/2019 ? Cycle 5 FOLFIRI 08/18/2019 ? CTs 09/08/2019-3.4 x 2.9 cm metastasis in segment 7 liver, previously 2.7 x 1.7 cm. 14 mm lesion/implant along the inferior right hepatic lobe, possibly new or possibly related to the prior peritoneal implant along the right paracolic gutter. 4 x 6 mm  subpleural nodule right lower lobe  not clearly evident on the prior study. Near complete resolution of prior heterogeneous enhancement involving the bilateral kidneys which likely reflected pyelonephritis. Near complete resolution of prior lymphadenopathy along with small bowel mesentery. Mild left inguinal lymphadenopathy measuring up to 14 mm, previously 15 mm. Lytic metastasis at L3 with associated soft tissue metastasis involving the left psoas muscle improved. ? Cycle 6 FOLFIRI 09/16/2019, Udenyca ? Cycle 7 FOLFIRI 09/30/2019 ? Treatment break ? Cycle 8 FOLFIRI 01/06/2020 ? Cycle 9 FOLFIRI 01/26/2020 ? Cycle 10 FOLFIRI 02/24/2020 ? Cycle 11 FOLFIRI 03/16/2020 ? CT abdomen/pelvis 04/11/2020-posterior right liver lesion slightly larger, a second lesion in the inferior hepatic lobe is no longer present, portacaval nodes have decreased in size, slight decrease in size of left inguinal node, decreased L3 paraspinal mass ? CTs 09/25/2020-interval enlargement of a right liver mass, new 5 mm soft tissue nodule at the midline ventral peritoneum, no change in lytic L3 lesion, innumerable tiny lung nodules-likely inflammatory ? Cycle 1 FOLFIRI 10/05/2020 ? Cycle 2 FOLFIRI 10/19/2020   3. Ongoing tobacco and alcohol use  4. Mild neutropenia related to chemotherapy 07/10/2017. Neulasta added.  5.Admission January 2019 with an abdominal wall abscess and pubis osteomyelitis, status post debridement and antibiotics  6.Pain secondary to #1. 7.CT 06/16/2019 with nonocclusive thrombus infrarenal IVC. Anticoagulation initiated. 8.Left patella fracture following a fall 08/20/2019 9. Admission 04/11/2020 with a small bowel obstruction, exploratory laparotomy with repair of multiple serosal tears and small bowel resection 04/19/2020, closure of abdomen with application of wound VAC 04/21/2020, pathology from small bowel resection 04/19/2020-acute serositis   Disposition: Yolanda Watson  completed a second cycle of FOLFIRI 10/19/2020.  She declined to return for follow-up until the weather became warmer.  We discussed obtaining restaging CT scans and resuming FOLFIRI if there is evidence of progression.  She agrees with this plan.  She will return for CT scans on 02/15/2021 and a follow-up visit on 02/22/2021.  Plan reviewed with Dr. Benay Spice.    Ned Card ANP/GNP-BC   02/01/2021  2:43 PM

## 2021-02-02 ENCOUNTER — Telehealth: Payer: Self-pay | Admitting: Nurse Practitioner

## 2021-02-02 NOTE — Telephone Encounter (Signed)
Scheduled appointments per 2/3 los. Spoke to patient who is aware of appointments dates and times.  

## 2021-02-08 MED FILL — XARELTO 20 MG TABLET: 20 | 30 days supply | Qty: 30 | Fill #2

## 2021-02-15 ENCOUNTER — Other Ambulatory Visit: Payer: Self-pay

## 2021-02-15 ENCOUNTER — Ambulatory Visit (HOSPITAL_COMMUNITY)
Admission: RE | Admit: 2021-02-15 | Discharge: 2021-02-15 | Disposition: A | Discharge status: Home / Self Care | Payer: Medicare Other | Source: Ambulatory Visit | Attending: Nurse Practitioner | Admitting: Nurse Practitioner

## 2021-02-15 ENCOUNTER — Encounter (HOSPITAL_COMMUNITY): Payer: Self-pay

## 2021-02-15 DIAGNOSIS — K7689 Other specified diseases of liver: Secondary | ICD-10-CM | POA: Diagnosis not present

## 2021-02-15 DIAGNOSIS — C787 Secondary malignant neoplasm of liver and intrahepatic bile duct: Secondary | ICD-10-CM | POA: Diagnosis not present

## 2021-02-15 DIAGNOSIS — I2510 Atherosclerotic heart disease of native coronary artery without angina pectoris: Secondary | ICD-10-CM | POA: Diagnosis not present

## 2021-02-15 DIAGNOSIS — C189 Malignant neoplasm of colon, unspecified: Secondary | ICD-10-CM

## 2021-02-15 DIAGNOSIS — J90 Pleural effusion, not elsewhere classified: Secondary | ICD-10-CM | POA: Diagnosis not present

## 2021-02-15 DIAGNOSIS — J439 Emphysema, unspecified: Secondary | ICD-10-CM | POA: Diagnosis not present

## 2021-02-15 DIAGNOSIS — I700 Atherosclerosis of aorta: Secondary | ICD-10-CM | POA: Diagnosis not present

## 2021-02-15 DIAGNOSIS — M4856XA Collapsed vertebra, not elsewhere classified, lumbar region, initial encounter for fracture: Secondary | ICD-10-CM | POA: Diagnosis not present

## 2021-02-15 MED ORDER — IOHEXOL 300 MG/ML  SOLN
100.0000 mL | Freq: Once | INTRAMUSCULAR | Status: AC | PRN
  Administered 2021-02-15: 100 mL via INTRAVENOUS

## 2021-02-15 MED ORDER — HEPARIN SOD (PORK) LOCK FLUSH 100 UNIT/ML IV SOLN: 500.0000 [IU] | Freq: Once | INTRAVENOUS | Status: DC

## 2021-02-15 MED ORDER — HEPARIN SOD (PORK) LOCK FLUSH 100 UNIT/ML IV SOLN
INTRAVENOUS | Status: AC
  Administered 2021-02-15: 500 [IU]
  Filled 2021-02-15: qty 5

## 2021-02-17 ENCOUNTER — Other Ambulatory Visit: Payer: Self-pay | Admitting: Oncology

## 2021-02-21 ENCOUNTER — Inpatient Hospital Stay: Payer: Medicare Other

## 2021-02-21 ENCOUNTER — Inpatient Hospital Stay (HOSPITAL_BASED_OUTPATIENT_CLINIC_OR_DEPARTMENT_OTHER): Payer: Medicare Other | Admitting: Oncology

## 2021-02-21 ENCOUNTER — Other Ambulatory Visit: Payer: Self-pay

## 2021-02-21 VITALS — BP 122/76 | HR 85 | Temp 97.7°F | Resp 18 | Ht 66.0 in | Wt 109.3 lb

## 2021-02-21 DIAGNOSIS — G893 Neoplasm related pain (acute) (chronic): Secondary | ICD-10-CM | POA: Diagnosis not present

## 2021-02-21 DIAGNOSIS — T451X5A Adverse effect of antineoplastic and immunosuppressive drugs, initial encounter: Secondary | ICD-10-CM | POA: Diagnosis not present

## 2021-02-21 DIAGNOSIS — C189 Malignant neoplasm of colon, unspecified: Secondary | ICD-10-CM

## 2021-02-21 DIAGNOSIS — C778 Secondary and unspecified malignant neoplasm of lymph nodes of multiple regions: Secondary | ICD-10-CM | POA: Diagnosis not present

## 2021-02-21 DIAGNOSIS — C787 Secondary malignant neoplasm of liver and intrahepatic bile duct: Secondary | ICD-10-CM | POA: Diagnosis not present

## 2021-02-21 DIAGNOSIS — Z5111 Encounter for antineoplastic chemotherapy: Secondary | ICD-10-CM | POA: Diagnosis not present

## 2021-02-21 DIAGNOSIS — D701 Agranulocytosis secondary to cancer chemotherapy: Secondary | ICD-10-CM | POA: Diagnosis not present

## 2021-02-21 DIAGNOSIS — Z95828 Presence of other vascular implants and grafts: Secondary | ICD-10-CM

## 2021-02-21 LAB — CMP (CANCER CENTER ONLY)
ALT: 26 U/L (ref 0–44)
AST: 24 U/L (ref 15–41)
Albumin: 4.1 g/dL (ref 3.5–5.0)
Alkaline Phosphatase: 152 U/L — ABNORMAL HIGH (ref 38–126)
Anion gap: 11 (ref 5–15)
BUN: 14 mg/dL (ref 8–23)
CO2: 22 mmol/L (ref 22–32)
Calcium: 8.9 mg/dL (ref 8.9–10.3)
Chloride: 105 mmol/L (ref 98–111)
Creatinine: 0.73 mg/dL (ref 0.44–1.00)
GFR, Estimated: >60 mL/min (ref >60)
Glucose, Bld: 97 mg/dL (ref 70–99)
Potassium: 4.2 mmol/L (ref 3.5–5.1)
Sodium: 138 mmol/L (ref 135–145)
Total Bilirubin: 0.6 mg/dL (ref 0.3–1.2)
Total Protein: 7.6 g/dL (ref 6.5–8.1)

## 2021-02-21 LAB — CBC WITH DIFFERENTIAL (CANCER CENTER ONLY)
Abs Immature Granulocytes: 0.03 10*3/uL (ref 0.00–0.07)
Basophils Absolute: 0.1 10*3/uL (ref 0.0–0.1)
Basophils Relative: 1 %
Eosinophils Absolute: 2.8 10*3/uL — ABNORMAL HIGH (ref 0.0–0.5)
Eosinophils Relative: 30 %
HCT: 43.2 % (ref 36.0–46.0)
Hemoglobin: 14.6 g/dL (ref 12.0–15.0)
Immature Granulocytes: 0 %
Lymphocytes Relative: 20 %
Lymphs Abs: 1.8 10*3/uL (ref 0.7–4.0)
MCH: 32.2 pg (ref 26.0–34.0)
MCHC: 33.8 g/dL (ref 30.0–36.0)
MCV: 95.2 fL (ref 80.0–100.0)
Monocytes Absolute: 0.6 10*3/uL (ref 0.1–1.0)
Monocytes Relative: 6 %
Neutro Abs: 4.1 10*3/uL (ref 1.7–7.7)
Neutrophils Relative %: 43 %
Platelet Count: 175 10*3/uL (ref 150–400)
RBC: 4.54 MIL/uL (ref 3.87–5.11)
RDW: 13 % (ref 11.5–15.5)
WBC Count: 9.4 10*3/uL (ref 4.0–10.5)
nRBC: 0 % (ref 0.0–0.2)

## 2021-02-21 LAB — CEA (IN HOUSE-CHCC): CEA (CHCC-In House): 1248.68 ng/mL — ABNORMAL HIGH (ref 0.00–5.00)

## 2021-02-21 MED ORDER — LEUCOVORIN CALCIUM INJECTION 350 MG
400.0000 mg/m2 | Freq: Once | INTRAVENOUS | Status: AC
  Administered 2021-02-21: 588 mg via INTRAVENOUS
  Filled 2021-02-21: qty 29.4

## 2021-02-21 MED ORDER — ATROPINE SULFATE 0.4 MG/ML IJ SOLN
INTRAMUSCULAR | Status: AC
  Filled 2021-02-21: qty 1

## 2021-02-21 MED ORDER — SODIUM CHLORIDE 0.9 % IV SOLN
10.0000 mg | Freq: Once | INTRAVENOUS | Status: AC
  Administered 2021-02-21: 10 mg via INTRAVENOUS
  Filled 2021-02-21: qty 10

## 2021-02-21 MED ORDER — PALONOSETRON HCL INJECTION 0.25 MG/5ML
0.2500 mg | Freq: Once | INTRAVENOUS | Status: AC
Start: 2021-02-21 — End: 2021-02-21
  Administered 2021-02-21: 0.25 mg via INTRAVENOUS

## 2021-02-21 MED ORDER — PALONOSETRON HCL INJECTION 0.25 MG/5ML
INTRAVENOUS | Status: AC
  Filled 2021-02-21: qty 5

## 2021-02-21 MED ORDER — IRINOTECAN HCL CHEMO INJECTION 100 MG/5ML
180.0000 mg/m2 | Freq: Once | INTRAVENOUS | Status: AC
  Administered 2021-02-21: 260 mg via INTRAVENOUS
  Filled 2021-02-21: qty 13

## 2021-02-21 MED ORDER — SODIUM CHLORIDE 0.9% FLUSH
10.0000 mL | INTRAVENOUS | Status: DC | PRN
  Administered 2021-02-21: 10 mL via INTRAVENOUS
  Filled 2021-02-21: qty 10

## 2021-02-21 MED ORDER — ATROPINE SULFATE 0.4 MG/ML IJ SOLN
0.4000 mg | Freq: Once | INTRAMUSCULAR | Status: AC | PRN
  Administered 2021-02-21: 0.4 mg via INTRAVENOUS

## 2021-02-21 MED ORDER — SODIUM CHLORIDE 0.9 % IV SOLN
Freq: Once | INTRAVENOUS | Status: AC
  Filled 2021-02-21: qty 250

## 2021-02-21 MED ORDER — SODIUM CHLORIDE 0.9 % IV SOLN
2400.0000 mg/m2 | INTRAVENOUS | Status: DC
  Administered 2021-02-21: 3550 mg via INTRAVENOUS
  Filled 2021-02-21: qty 71

## 2021-02-21 MED ORDER — FLUOROURACIL CHEMO INJECTION 2.5 GM/50ML
400.0000 mg/m2 | Freq: Once | INTRAVENOUS | Status: AC
  Administered 2021-02-21: 600 mg via INTRAVENOUS
  Filled 2021-02-21: qty 12

## 2021-02-21 NOTE — Patient Instructions (Signed)

## 2021-02-21 NOTE — Progress Notes (Signed)
Pt discharged in no apparent distress. Pt left ambulatory without assistance. Pt aware of discharge instructions and verbalized understanding and had no further questions.  

## 2021-02-21 NOTE — Patient Instructions (Signed)
[facility] Discharge Instructions for Patients Receiving Chemotherapy  Today you received the following chemotherapy agents: Irinotecan, Leucovorin, and Fluorouracil  To help prevent nausea and vomiting after your treatment, we encourage you to take your nausea medication as directed by your MD.   If you develop nausea and vomiting that is not controlled by your nausea medication, call the clinic.   BELOW ARE SYMPTOMS THAT SHOULD BE REPORTED IMMEDIATELY:  *FEVER GREATER THAN 100.5 F  *CHILLS WITH OR WITHOUT FEVER  NAUSEA AND VOMITING THAT IS NOT CONTROLLED WITH YOUR NAUSEA MEDICATION  *UNUSUAL SHORTNESS OF BREATH  *UNUSUAL BRUISING OR BLEEDING  TENDERNESS IN MOUTH AND THROAT WITH OR WITHOUT PRESENCE OF ULCERS  *URINARY PROBLEMS  *BOWEL PROBLEMS  UNUSUAL RASH Items with * indicate a potential emergency and should be followed up as soon as possible.  Feel free to call the clinic should you have any questions or concerns. The clinic phone number is [phone].  Please show the CHEMO ALERT CARD at check-in to the Emergency Department and triage nurse.  [facility] Discharge Instructions for Patients receiving Home Portable Chemo Pump    **The bag should finish at 46 hours, 96 hours or 7 days. For example, if your pump is scheduled for 46 hours and it was put on at 4pm, it should finish at 2 pm the day it is scheduled to come off regardless of your appointment time.    Estimated time to finish   _________________________ (Have your nurse fill in)     ** if the display on your pump reads "Low Volume" and it is beeping, take the batteries out of the pump and come to the cancer center for it to be taken off.   **If the pump alarms go off prior to the pump reading "Low Volume" then call the [phone] and someone can assist you.  **If the plunger comes out and the bag fluid is running out, please use your chemo spill kit to clean up the  spill. Do not use paper towels or other house hold products.  ** If you have problems or questions regarding your pump, please call either the [phone] or the cancer center Monday-Friday 8:00am-4:30pm at [phone] and we will assist you.  If you are unable to get assistance then go to [facility] Emergency Room, ask the staff to contact the IV team for assistance.     

## 2021-02-21 NOTE — Progress Notes (Signed)
[facility] OFFICE PROGRESS NOTE   Diagnosis: Colon cancer  INTERVAL HISTORY:   Yolanda Watson reports feeling well.  Good appetite.  No pain.  The left groin nodule has increased in size.  She was last treated with chemotherapy in October 2021.  She agrees to resume chemotherapy today.  She does not wish to receive G-CSF.   Objective:  Vital signs in last 24 hours:  Blood pressure 122/76, pulse 85, temperature 97.7 F (36.5 C), temperature source Tympanic, resp. rate 18, height 5' 6" (1.676 m), weight 109 lb 4.8 oz (49.6 kg), SpO2 100 %.    HEENT: No thrush or ulcers Lymphatics: Firm 3 cm left inguinal node Resp: Distant breath sounds, scattered inspiratory rhonchi, no respiratory distress Cardio: Regular rate and rhythm GI: No hepatosplenomegaly, healed midline incision, no mass Vascular: No leg edema  Portacath/PICC-without erythema  Lab Results:  Lab Results  Component Value Date   WBC 9.4 02/21/2021   HGB 14.6 02/21/2021   HCT 43.2 02/21/2021   MCV 95.2 02/21/2021   PLT 175 02/21/2021   NEUTROABS 4.1 02/21/2021    CMP  Lab Results  Component Value Date   NA 138 02/21/2021   K 4.2 02/21/2021   CL 105 02/21/2021   CO2 22 02/21/2021   GLUCOSE 97 02/21/2021   BUN 14 02/21/2021   CREATININE 0.73 02/21/2021   CALCIUM 8.9 02/21/2021   PROT 7.6 02/21/2021   ALBUMIN 4.1 02/21/2021   AST 24 02/21/2021   ALT 26 02/21/2021   ALKPHOS 152 (H) 02/21/2021   BILITOT 0.6 02/21/2021   GFRNONAA >60 02/21/2021   GFRAA >60 08/24/2020    Lab Results  Component Value Date   CEA1 1,135.14 (H) 02/01/2021      Imaging:  CT images from 02/15/2021-reviewed Medications: I have reviewed the patient's current medications.   Assessment/Plan: 1. Metastatic colon cancer presenting with an Abdominal/pelvic mass  2. Colon cancerdiagnosed in [place] in 2016-pathology report dated 02/15/2015-invasive adenocarcinoma, well-differentiated with prominent  mucinous features; microscopic tumor extension into mesenteric adipose tissue and focally involving visceral peritoneum; radial and mucosal margins negative; lymph-vascular invasion withsuspicious foci identified;perineural invasion not identified; 16 negative lymph nodes; tumor deposits not identified; pT4apN0;MSI stable,BRAF V600E, ATM, MSS, TMB indeterminant-8, RAS wild-type  ? CT 04/22/2017 revealed a heterogenous anterior pelvic wall mass, right inguinal mass, top normal sized retroperitoneal and iliac nodes, abutment of the anterior dome of the bladder and a loop of small bowel ? 04/29/2017 biopsy of anterior abdominal/pelvic wall mass-acellular mucin dissecting fibrous stroma ? Cycle 1 FOLFOX 05/15/2017 ? Cycle 2 FOLFOX 05/28/2017 ? Cycle 3 FOLFOX with Avastin on 06/11/2017 ? Cycle 4 FOLFOX/Avastin 06/25/2017 ? Cycle 5 FOLFOX/Avastin 07/10/2017, Neulasta added ? Restaging CT 07/21/2017-mild decrease in the size of a right inguinal node and the anterior abdominal wall mass ? Radiation to the abdominal wall mass 08/18/2017-09/03/2017 ? Status post excision of abdominal wall mass 10/03/2017--pathology showed adenocarcinoma with excessive extracellular mucin. Margins negative for tumor. Right inguinal wall mass also excised showing metastatic adenocarcinoma with excessive extracellular mucin in 1 of 8 lymph nodes. ? CT abdomen/pelvis 06/16/2019-new right hepatic lobe lesion measuring 2.7 x 1.7 cm. Hypoattenuation within the pancreatic uncinate process measuring 1.5 x 1.6 cm. Mild left adrenal thickening. Upper pole left renal mass 3.0 x 3.6 cm. Infiltrative hypoenhancement involving the lower pole left kidney. Nonocclusive thrombus within the infrarenal IVC. Necrotic adenopathy within the small bowel mesentery, new. New necrotic left inguinal nodes. Lytic lesion within and adjacent the anterior   side of the L3 vertebral body. Contiguous mass within the left psoas measuring 4.6 x 3.4  cm. ? Cycle 1 FOLFIRI 06/23/2019 ? Cycle 2 FOLFIRI 07/08/2019, Udenyca added ? Cycle 3 FOLFIRI 07/22/2019 ? Cycle 4 FOLFIRI 08/04/2019 ? Cycle 5 FOLFIRI 08/18/2019 ? CTs 09/08/2019-3.4 x 2.9 cm metastasis in segment 7 liver, previously 2.7 x 1.7 cm. 14 mm lesion/implant along the inferior right hepatic lobe, possibly new or possibly related to the prior peritoneal implant along the right paracolic gutter. 4 x 6 mm subpleural nodule right lower lobe not clearly evident on the prior study. Near complete resolution of prior heterogeneous enhancement involving the bilateral kidneys which likely reflected pyelonephritis. Near complete resolution of prior lymphadenopathy along with small bowel mesentery. Mild left inguinal lymphadenopathy measuring up to 14 mm, previously 15 mm. Lytic metastasis at L3 with associated soft tissue metastasis involving the left psoas muscle improved. ? Cycle 6 FOLFIRI 09/16/2019, Udenyca ? Cycle 7 FOLFIRI 09/30/2019 ? Treatment break ? Cycle 8 FOLFIRI 01/06/2020 ? Cycle 9 FOLFIRI 01/26/2020 ? Cycle 10 FOLFIRI 02/24/2020 ? Cycle 11 FOLFIRI 03/16/2020 ? CT abdomen/pelvis 04/11/2020-posterior right liver lesion slightly larger, a second lesion in the inferior hepatic lobe is no longer present, portacaval nodes have decreased in size, slight decrease in size of left inguinal node, decreased L3 paraspinal mass ? CTs 09/25/2020-interval enlargement of a right liver mass, new 5 mm soft tissue nodule at the midline ventral peritoneum, no change in lytic L3 lesion, innumerable tiny lung nodules-likely inflammatory ? Cycle 1 FOLFIRI 10/05/2020 ? Cycle 2 FOLFIRI 10/19/2020 ? CTs 02/15/2021-enlargement of dominant right hepatic metastasis, enlargement of left inguinal node, stable density at the right paracolic gutter, resolution of omental nodule in the midline, new splenic infarct ? Cycle 1 FOLFIRI 02/21/2021   3. Ongoing tobacco and alcohol use  4. Mild neutropenia related to  chemotherapy 07/10/2017. Neulasta added.  5.Admission January 2019 with an abdominal wall abscess and pubis osteomyelitis, status post debridement and antibiotics  6.Pain secondary to #1. 7.CT 06/16/2019 with nonocclusive thrombus infrarenal IVC. Anticoagulation initiated. 8.Left patella fracture following a fall 08/20/2019 9. Admission 04/11/2020 with a small bowel obstruction, exploratory laparotomy with repair of multiple serosal tears and small bowel resection 04/19/2020, closure of abdomen with application of wound VAC 04/21/2020, pathology from small bowel resection 04/19/2020-acute serositis     Disposition: Yolanda Watson has metastatic colon cancer.  She appears unchanged.  The CEA was higher earlier this month.  The restaging CTs from last week are consistent with disease progression in the liver and left groin.  She has been maintained off of systemic therapy since October 2021.  She agrees to resume FOLFIRI today.  I reviewed the CT images.  Yolanda Watson does not wish to receive G-CSF.  She will return for an office visit and chemotherapy in 3 weeks.  We will consider BRAF directed therapy if the tumor progresses on FOLFIRI.  Betsy Coder, MD  02/21/2021  11:31 AM

## 2021-02-22 ENCOUNTER — Telehealth: Payer: Self-pay | Admitting: Oncology

## 2021-02-22 NOTE — Telephone Encounter (Signed)
Scheduled appointments per 2/23 los. Spoke to patient who is aware of appointments dates and times.

## 2021-02-23 ENCOUNTER — Inpatient Hospital Stay: Payer: Medicare Other

## 2021-02-23 ENCOUNTER — Other Ambulatory Visit: Payer: Self-pay

## 2021-02-23 VITALS — BP 147/81 | HR 86 | Resp 18

## 2021-02-23 DIAGNOSIS — C778 Secondary and unspecified malignant neoplasm of lymph nodes of multiple regions: Secondary | ICD-10-CM | POA: Diagnosis not present

## 2021-02-23 DIAGNOSIS — D701 Agranulocytosis secondary to cancer chemotherapy: Secondary | ICD-10-CM | POA: Diagnosis not present

## 2021-02-23 DIAGNOSIS — C189 Malignant neoplasm of colon, unspecified: Secondary | ICD-10-CM | POA: Diagnosis not present

## 2021-02-23 DIAGNOSIS — Z5111 Encounter for antineoplastic chemotherapy: Secondary | ICD-10-CM | POA: Diagnosis not present

## 2021-02-23 DIAGNOSIS — T451X5A Adverse effect of antineoplastic and immunosuppressive drugs, initial encounter: Secondary | ICD-10-CM | POA: Diagnosis not present

## 2021-02-23 DIAGNOSIS — C787 Secondary malignant neoplasm of liver and intrahepatic bile duct: Secondary | ICD-10-CM

## 2021-02-23 DIAGNOSIS — G893 Neoplasm related pain (acute) (chronic): Secondary | ICD-10-CM | POA: Diagnosis not present

## 2021-02-23 MED ORDER — SODIUM CHLORIDE 0.9% FLUSH
10.0000 mL | INTRAVENOUS | Status: DC | PRN
  Administered 2021-02-23: 10 mL
  Filled 2021-02-23: qty 10

## 2021-02-23 MED ORDER — HEPARIN SOD (PORK) LOCK FLUSH 100 UNIT/ML IV SOLN
500.0000 [IU] | Freq: Once | INTRAVENOUS | Status: AC | PRN
  Administered 2021-02-23: 500 [IU]
  Filled 2021-02-23: qty 5

## 2021-03-11 ENCOUNTER — Other Ambulatory Visit: Payer: Self-pay | Admitting: Oncology

## 2021-03-13 ENCOUNTER — Other Ambulatory Visit: Payer: Self-pay | Admitting: Oncology

## 2021-03-13 DIAGNOSIS — C189 Malignant neoplasm of colon, unspecified: Secondary | ICD-10-CM

## 2021-03-15 ENCOUNTER — Encounter: Payer: Self-pay | Admitting: Nurse Practitioner

## 2021-03-15 ENCOUNTER — Inpatient Hospital Stay: Payer: Medicare Other

## 2021-03-15 ENCOUNTER — Other Ambulatory Visit: Payer: Self-pay

## 2021-03-15 ENCOUNTER — Inpatient Hospital Stay (HOSPITAL_BASED_OUTPATIENT_CLINIC_OR_DEPARTMENT_OTHER): Payer: Medicare Other | Admitting: Nurse Practitioner

## 2021-03-15 ENCOUNTER — Inpatient Hospital Stay: Payer: Medicare Other | Attending: Nurse Practitioner

## 2021-03-15 VITALS — BP 150/75 | HR 84 | Temp 97.8°F | Resp 18 | Ht 66.0 in | Wt 108.3 lb

## 2021-03-15 DIAGNOSIS — C189 Malignant neoplasm of colon, unspecified: Secondary | ICD-10-CM

## 2021-03-15 DIAGNOSIS — C787 Secondary malignant neoplasm of liver and intrahepatic bile duct: Secondary | ICD-10-CM | POA: Insufficient documentation

## 2021-03-15 DIAGNOSIS — Z5111 Encounter for antineoplastic chemotherapy: Secondary | ICD-10-CM | POA: Insufficient documentation

## 2021-03-15 DIAGNOSIS — Z452 Encounter for adjustment and management of vascular access device: Secondary | ICD-10-CM | POA: Insufficient documentation

## 2021-03-15 DIAGNOSIS — Z95828 Presence of other vascular implants and grafts: Secondary | ICD-10-CM

## 2021-03-15 LAB — CMP (CANCER CENTER ONLY)
ALT: 29 U/L (ref 0–44)
AST: 30 U/L (ref 15–41)
Albumin: 4.2 g/dL (ref 3.5–5.0)
Alkaline Phosphatase: 182 U/L — ABNORMAL HIGH (ref 38–126)
Anion gap: 10 (ref 5–15)
BUN: 22 mg/dL (ref 8–23)
CO2: 24 mmol/L (ref 22–32)
Calcium: 9 mg/dL (ref 8.9–10.3)
Chloride: 104 mmol/L (ref 98–111)
Creatinine: 0.88 mg/dL (ref 0.44–1.00)
GFR, Estimated: >60 mL/min (ref >60)
Glucose, Bld: 93 mg/dL (ref 70–99)
Potassium: 4.2 mmol/L (ref 3.5–5.1)
Sodium: 138 mmol/L (ref 135–145)
Total Bilirubin: 0.5 mg/dL (ref 0.3–1.2)
Total Protein: 7.7 g/dL (ref 6.5–8.1)

## 2021-03-15 LAB — CBC WITH DIFFERENTIAL (CANCER CENTER ONLY)
Abs Immature Granulocytes: 0.01 10*3/uL (ref 0.00–0.07)
Basophils Absolute: 0.1 10*3/uL (ref 0.0–0.1)
Basophils Relative: 2 %
Eosinophils Absolute: 1 10*3/uL — ABNORMAL HIGH (ref 0.0–0.5)
Eosinophils Relative: 18 %
HCT: 41.1 % (ref 36.0–46.0)
Hemoglobin: 14 g/dL (ref 12.0–15.0)
Immature Granulocytes: 0 %
Lymphocytes Relative: 33 %
Lymphs Abs: 1.8 10*3/uL (ref 0.7–4.0)
MCH: 32.6 pg (ref 26.0–34.0)
MCHC: 34.1 g/dL (ref 30.0–36.0)
MCV: 95.6 fL (ref 80.0–100.0)
Monocytes Absolute: 0.7 10*3/uL (ref 0.1–1.0)
Monocytes Relative: 12 %
Neutro Abs: 1.9 10*3/uL (ref 1.7–7.7)
Neutrophils Relative %: 35 %
Platelet Count: 173 10*3/uL (ref 150–400)
RBC: 4.3 MIL/uL (ref 3.87–5.11)
RDW: 14.4 % (ref 11.5–15.5)
WBC Count: 5.5 10*3/uL (ref 4.0–10.5)
nRBC: 0 % (ref 0.0–0.2)

## 2021-03-15 MED ORDER — SODIUM CHLORIDE 0.9 % IV SOLN
2400.0000 mg/m2 | INTRAVENOUS | Status: DC
  Administered 2021-03-15: 3550 mg via INTRAVENOUS
  Filled 2021-03-15: qty 71

## 2021-03-15 MED ORDER — FLUOROURACIL CHEMO INJECTION 2.5 GM/50ML
400.0000 mg/m2 | Freq: Once | INTRAVENOUS | Status: AC
Start: 2021-03-15 — End: 2021-03-15
  Administered 2021-03-15: 600 mg via INTRAVENOUS
  Filled 2021-03-15: qty 12

## 2021-03-15 MED ORDER — SODIUM CHLORIDE 0.9 % IV SOLN
Freq: Once | INTRAVENOUS | Status: AC
  Filled 2021-03-15: qty 250

## 2021-03-15 MED ORDER — SODIUM CHLORIDE 0.9% FLUSH
10.0000 mL | INTRAVENOUS | Status: DC | PRN
  Administered 2021-03-15: 10 mL via INTRAVENOUS
  Filled 2021-03-15: qty 10

## 2021-03-15 MED ORDER — ATROPINE SULFATE 0.4 MG/ML IJ SOLN
0.4000 mg | Freq: Once | INTRAMUSCULAR | Status: AC | PRN
  Administered 2021-03-15: 0.4 mg via INTRAVENOUS

## 2021-03-15 MED ORDER — SODIUM CHLORIDE 0.9 % IV SOLN
10.0000 mg | Freq: Once | INTRAVENOUS | Status: AC
  Administered 2021-03-15: 10 mg via INTRAVENOUS
  Filled 2021-03-15: qty 10

## 2021-03-15 MED ORDER — PALONOSETRON HCL INJECTION 0.25 MG/5ML
0.2500 mg | Freq: Once | INTRAVENOUS | Status: AC
  Administered 2021-03-15: 0.25 mg via INTRAVENOUS

## 2021-03-15 MED ORDER — IRINOTECAN HCL CHEMO INJECTION 100 MG/5ML
180.0000 mg/m2 | Freq: Once | INTRAVENOUS | Status: AC
  Administered 2021-03-15: 260 mg via INTRAVENOUS
  Filled 2021-03-15: qty 13

## 2021-03-15 MED ORDER — ATROPINE SULFATE 1 MG/ML IJ SOLN
INTRAMUSCULAR | Status: AC
  Filled 2021-03-15: qty 1

## 2021-03-15 MED ORDER — PALONOSETRON HCL INJECTION 0.25 MG/5ML
INTRAVENOUS | Status: AC
  Filled 2021-03-15: qty 5

## 2021-03-15 MED ORDER — ATROPINE SULFATE 0.4 MG/ML IJ SOLN
INTRAMUSCULAR | Status: AC
  Filled 2021-03-15: qty 1

## 2021-03-15 MED ORDER — SODIUM CHLORIDE 0.9% FLUSH
10.0000 mL | INTRAVENOUS | Status: DC | PRN
  Administered 2021-03-15: 10 mL
  Filled 2021-03-15: qty 10

## 2021-03-15 MED ORDER — LEUCOVORIN CALCIUM INJECTION 350 MG
400.0000 mg/m2 | Freq: Once | INTRAMUSCULAR | Status: AC
  Administered 2021-03-15: 588 mg via INTRAVENOUS
  Filled 2021-03-15: qty 29.4

## 2021-03-15 NOTE — Progress Notes (Signed)
[place] OFFICE PROGRESS NOTE   Diagnosis: Colon cancer  INTERVAL HISTORY:   Yolanda Watson returns as scheduled.  She resumed treatment with FOLFIRI 02/21/2021.  She had mild nausea lasting 1 day.  No mouth sores.  No diarrhea.  No hand or foot pain or redness.  She continues Xarelto.  She denies bleeding.  Objective:  Vital signs in last 24 hours:  Blood pressure (!) 150/75, pulse 84, temperature 97.8 F (36.6 C), temperature source Tympanic, resp. rate 18, height _0  (1.676 m), weight 108 lb 4.8 oz (49.1 kg), SpO2 100 %.    HEENT: No thrush or ulcers. Lymphatics: 3 cm firm lymph node left inguinal region. Resp: Lungs clear bilaterally. Cardio: Regular rate and rhythm. GI: No hepatomegaly. Vascular: No leg edema. Skin: No rash. Port-A-Cath without erythema.   Lab Results:  Lab Results  Component Value Date   WBC 5.5 03/15/2021   HGB 14.0 03/15/2021   HCT 41.1 03/15/2021   MCV 95.6 03/15/2021   PLT 173 03/15/2021   NEUTROABS 1.9 03/15/2021    Imaging:  No results found.  Medications: I have reviewed the patient's current medications.  Assessment/Plan: 1. Metastatic colon cancer presenting with an Abdominal/pelvic mass  2. Colon cancerdiagnosed in [place] in 2016-pathology report dated 02/15/2015-invasive adenocarcinoma, well-differentiated with prominent mucinous features; microscopic tumor extension into mesenteric adipose tissue and focally involving visceral peritoneum; radial and mucosal margins negative; lymph-vascular invasion withsuspicious foci identified;perineural invasion not identified; 16 negative lymph nodes; tumor deposits not identified; pT4apN0;MSI stable,BRAF V600E, ATM, MSS, TMB indeterminant-8, RASwild-type  ? CT 04/22/2017 revealed a heterogenous anterior pelvic wall mass, right inguinal mass, top normal sized retroperitoneal and iliac nodes, abutment of the anterior dome of the bladder and a loop of small  bowel ? 04/29/2017 biopsy of anterior abdominal/pelvic wall mass-acellular mucin dissecting fibrous stroma ? Cycle 1 FOLFOX 05/15/2017 ? Cycle 2 FOLFOX 05/28/2017 ? Cycle 3 FOLFOX with Avastin on 06/11/2017 ? Cycle 4 FOLFOX/Avastin 06/25/2017 ? Cycle 5 FOLFOX/Avastin 07/10/2017, Neulasta added ? Restaging CT 07/21/2017-mild decrease in the size of a right inguinal node and the anterior abdominal wall mass ? Radiation to the abdominal wall mass 08/18/2017-09/03/2017 ? Status post excision of abdominal wall mass 10/03/2017--pathology showed adenocarcinoma with excessive extracellular mucin. Margins negative for tumor. Right inguinal wall mass also excised showing metastatic adenocarcinoma with excessive extracellular mucin in 1 of 8 lymph nodes. ? CT abdomen/pelvis 06/16/2019-new right hepatic lobe lesion measuring 2.7 x 1.7 cm. Hypoattenuation within the pancreatic uncinate process measuring 1.5 x 1.6 cm. Mild left adrenal thickening. Upper pole left renal mass 3.0 x 3.6 cm. Infiltrative hypoenhancement involving the lower pole left kidney. Nonocclusive thrombus within the infrarenal IVC. Necrotic adenopathy within the small bowel mesentery, new. New necrotic left inguinal nodes. Lytic lesion within and adjacent the anterior side of the L3 vertebral body. Contiguous mass within the left psoas measuring 4.6 x 3.4 cm. ? Cycle 1 FOLFIRI 06/23/2019 ? Cycle 2 FOLFIRI 07/08/2019, Udenyca added ? Cycle 3 FOLFIRI 07/22/2019 ? Cycle 4 FOLFIRI 08/04/2019 ? Cycle 5 FOLFIRI 08/18/2019 ? CTs 09/08/2019-3.4 x 2.9 cm metastasis in segment 7 liver, previously 2.7 x 1.7 cm. 14 mm lesion/implant along the inferior right hepatic lobe, possibly new or possibly related to the prior peritoneal implant along the right paracolic gutter. 4 x 6 mm subpleural nodule right lower lobe not clearly evident on the prior study. Near complete resolution of prior heterogeneous enhancement involving the bilateral kidneys which  likely reflected pyelonephritis. Near complete  resolution of prior lymphadenopathy along with small bowel mesentery. Mild left inguinal lymphadenopathy measuring up to 14 mm, previously 15 mm. Lytic metastasis at L3 with associated soft tissue metastasis involving the left psoas muscle improved. ? Cycle 6 FOLFIRI 09/16/2019, Udenyca ? Cycle 7 FOLFIRI 09/30/2019 ? Treatment break ? Cycle 8 FOLFIRI 01/06/2020 ? Cycle 9 FOLFIRI 01/26/2020 ? Cycle 10 FOLFIRI 02/24/2020 ? Cycle 11 FOLFIRI 03/16/2020 ? CT abdomen/pelvis 04/11/2020-posterior right liver lesion slightly larger, a second lesion in the inferior hepatic lobe is no longer present, portacaval nodes have decreased in size, slight decrease in size of left inguinal node, decreased L3 paraspinal mass ? CTs 09/25/2020-interval enlargement of a right liver mass, new 5 mm soft tissue nodule at the midline ventral peritoneum, no change in lytic L3 lesion, innumerable tiny lung nodules-likely inflammatory ? Cycle 1 FOLFIRI 10/05/2020 ? Cycle 2 FOLFIRI 10/19/2020 ? CTs 02/15/2021-enlargement of dominant right hepatic metastasis, enlargement of left inguinal node, stable density at the right paracolic gutter, resolution of omental nodule in the midline, new splenic infarct ? Cycle 1 FOLFIRI 02/21/2021 ? Cycle 2 FOLFIRI 03/15/2021, Udenyca   3. Ongoing tobacco and alcohol use  4. Mild neutropenia related to chemotherapy 07/10/2017. Neulasta added.  5.Admission January 2019 with an abdominal wall abscess and pubis osteomyelitis, status post debridement and antibiotics  6.Pain secondary to #1. 7.CT 06/16/2019 with nonocclusive thrombus infrarenal IVC. Anticoagulation initiated. 8.Left patella fracture following a fall 08/20/2019 9. Admission 04/11/2020 with a small bowel obstruction, exploratory laparotomy with repair of multiple serosal tears and small bowel resection 04/19/2020, closure of abdomen with application of wound VAC  04/21/2020, pathology from small bowel resection 04/19/2020-acute serositis     Disposition: [name] appears stable.  She has completed 1 cycle of FOLFIRI.  She tolerated well.  Plan to proceed with cycle 2 today as scheduled.  We reviewed the CBC from today.  Counts are adequate to proceed as treatment.  The absolute neutrophil count is at the low end of normal range.  She agrees to [place] with this cycle.  She will return for lab, follow-up, cycle 3 FOLFIRI in 3 weeks.  She will contact the office in the interim with any problems.    Ned Card ANP/GNP-BC   03/15/2021  10:39 AM

## 2021-03-15 NOTE — Patient Instructions (Signed)

## 2021-03-17 ENCOUNTER — Inpatient Hospital Stay: Payer: Medicare Other

## 2021-03-17 ENCOUNTER — Other Ambulatory Visit: Payer: Self-pay

## 2021-03-17 VITALS — BP 149/68 | HR 83 | Temp 98.1°F | Resp 18 | Ht 66.0 in

## 2021-03-17 DIAGNOSIS — C189 Malignant neoplasm of colon, unspecified: Secondary | ICD-10-CM | POA: Diagnosis not present

## 2021-03-17 DIAGNOSIS — Z452 Encounter for adjustment and management of vascular access device: Secondary | ICD-10-CM | POA: Diagnosis not present

## 2021-03-17 DIAGNOSIS — Z5111 Encounter for antineoplastic chemotherapy: Secondary | ICD-10-CM | POA: Diagnosis not present

## 2021-03-17 DIAGNOSIS — C787 Secondary malignant neoplasm of liver and intrahepatic bile duct: Secondary | ICD-10-CM | POA: Diagnosis not present

## 2021-03-17 MED ORDER — PEGFILGRASTIM-CBQV 6 MG/0.6ML ~~LOC~~ SOSY: 6.0000 mg | PREFILLED_SYRINGE | Freq: Once | SUBCUTANEOUS | Status: DC

## 2021-03-17 MED ORDER — SODIUM CHLORIDE 0.9% FLUSH
10.0000 mL | INTRAVENOUS | Status: DC | PRN
  Filled 2021-03-17: qty 10

## 2021-03-17 MED ORDER — HEPARIN SOD (PORK) LOCK FLUSH 100 UNIT/ML IV SOLN
500.0000 [IU] | Freq: Once | INTRAVENOUS | Status: DC | PRN
  Filled 2021-03-17: qty 5

## 2021-03-17 NOTE — Patient Instructions (Signed)

## 2021-03-21 ENCOUNTER — Telehealth: Payer: Self-pay | Admitting: Oncology

## 2021-03-21 NOTE — Telephone Encounter (Signed)
Scheduled appt per 3/17 los - pt is aware of appts on 4/7 at [place] location and pump d/c on 4/09 at [facility] location.

## 2021-03-31 ENCOUNTER — Other Ambulatory Visit: Payer: Self-pay | Admitting: Oncology

## 2021-04-03 ENCOUNTER — Telehealth: Payer: Self-pay | Admitting: *Deleted

## 2021-04-03 NOTE — Telephone Encounter (Signed)
Called patient to f/u on her reason for canceling her appointments on 04/05/21. She reports "my son is going through some stuff and ai need to be here with him to be sure is OK". States she may be ready to come back in May, but is hesitant to resume chemotherapy. She understands that her cancer will continue to progress without chemotherapy. Will f/u with patient in May.

## 2021-04-05 ENCOUNTER — Inpatient Hospital Stay: Payer: Medicare Other

## 2021-04-05 ENCOUNTER — Inpatient Hospital Stay: Payer: Medicare Other | Admitting: Oncology

## 2021-04-09 ENCOUNTER — Telehealth: Payer: Self-pay | Admitting: *Deleted

## 2021-04-09 NOTE — Telephone Encounter (Signed)
Husband left message calling for Dorthia to request appointment with Dr. Benay Spice on 05/10/21. Per Dr. Benay Spice, due to 1.5 months after last chemo he will see her for lab/flush/OV only that day. Scheduling message sent.

## 2021-04-10 ENCOUNTER — Telehealth: Payer: Self-pay | Admitting: Oncology

## 2021-04-10 NOTE — Telephone Encounter (Signed)
Called and spoke with patient regarding appointments added per 4/11 sch msg.  She was ok with both date & time as well.  Aware of new location @ [facility]

## 2021-04-13 ENCOUNTER — Other Ambulatory Visit (HOSPITAL_COMMUNITY): Payer: Self-pay

## 2021-04-13 MED FILL — Rivaroxaban Tab 20 MG: ORAL | 30 days supply | Qty: 30 | Fill #0 | Status: AC

## 2021-04-16 ENCOUNTER — Telehealth: Payer: Self-pay | Admitting: Oncology

## 2021-04-16 NOTE — Telephone Encounter (Signed)
Due to [employer] 5/12 appts rescheduled updated calendar was sent.

## 2021-05-10 ENCOUNTER — Other Ambulatory Visit: Payer: Medicare Other

## 2021-05-10 ENCOUNTER — Ambulatory Visit: Payer: Medicare Other | Admitting: Oncology

## 2021-05-11 ENCOUNTER — Encounter: Payer: Self-pay | Admitting: Oncology

## 2021-05-11 ENCOUNTER — Inpatient Hospital Stay (HOSPITAL_BASED_OUTPATIENT_CLINIC_OR_DEPARTMENT_OTHER): Payer: Medicare PPO | Admitting: Oncology

## 2021-05-11 ENCOUNTER — Inpatient Hospital Stay: Payer: Medicare PPO

## 2021-05-11 ENCOUNTER — Other Ambulatory Visit: Payer: Self-pay

## 2021-05-11 ENCOUNTER — Inpatient Hospital Stay: Payer: Medicare PPO | Attending: Nurse Practitioner

## 2021-05-11 VITALS — BP 159/88 | HR 86 | Temp 98.2°F | Resp 18 | Ht 66.0 in | Wt 111.0 lb

## 2021-05-11 DIAGNOSIS — C250 Malignant neoplasm of head of pancreas: Secondary | ICD-10-CM | POA: Insufficient documentation

## 2021-05-11 DIAGNOSIS — Z95828 Presence of other vascular implants and grafts: Secondary | ICD-10-CM

## 2021-05-11 DIAGNOSIS — Z79899 Other long term (current) drug therapy: Secondary | ICD-10-CM | POA: Diagnosis not present

## 2021-05-11 DIAGNOSIS — C787 Secondary malignant neoplasm of liver and intrahepatic bile duct: Secondary | ICD-10-CM | POA: Diagnosis not present

## 2021-05-11 DIAGNOSIS — Z9221 Personal history of antineoplastic chemotherapy: Secondary | ICD-10-CM | POA: Insufficient documentation

## 2021-05-11 DIAGNOSIS — C189 Malignant neoplasm of colon, unspecified: Secondary | ICD-10-CM

## 2021-05-11 DIAGNOSIS — Z923 Personal history of irradiation: Secondary | ICD-10-CM | POA: Diagnosis not present

## 2021-05-11 LAB — CBC WITH DIFFERENTIAL (CANCER CENTER ONLY)
Abs Immature Granulocytes: 0.03 10*3/uL (ref 0.00–0.07)
Basophils Absolute: 0.1 10*3/uL (ref 0.0–0.1)
Basophils Relative: 1 %
Eosinophils Absolute: 2.3 10*3/uL — ABNORMAL HIGH (ref 0.0–0.5)
Eosinophils Relative: 24 %
HCT: 42.1 % (ref 36.0–46.0)
Hemoglobin: 14 g/dL (ref 12.0–15.0)
Immature Granulocytes: 0 %
Lymphocytes Relative: 17 %
Lymphs Abs: 1.6 10*3/uL (ref 0.7–4.0)
MCH: 32.9 pg (ref 26.0–34.0)
MCHC: 33.3 g/dL (ref 30.0–36.0)
MCV: 98.8 fL (ref 80.0–100.0)
Monocytes Absolute: 0.6 10*3/uL (ref 0.1–1.0)
Monocytes Relative: 6 %
Neutro Abs: 5.1 10*3/uL (ref 1.7–7.7)
Neutrophils Relative %: 52 %
Platelet Count: 167 10*3/uL (ref 150–400)
RBC: 4.26 MIL/uL (ref 3.87–5.11)
RDW: 13.8 % (ref 11.5–15.5)
WBC Count: 9.7 10*3/uL (ref 4.0–10.5)
nRBC: 0 % (ref 0.0–0.2)

## 2021-05-11 LAB — CMP (CANCER CENTER ONLY)
ALT: 22 U/L (ref 0–44)
AST: 22 U/L (ref 15–41)
Albumin: 4.2 g/dL (ref 3.5–5.0)
Alkaline Phosphatase: 176 U/L — ABNORMAL HIGH (ref 38–126)
Anion gap: 10 (ref 5–15)
BUN: 25 mg/dL — ABNORMAL HIGH (ref 8–23)
CO2: 24 mmol/L (ref 22–32)
Calcium: 8.9 mg/dL (ref 8.9–10.3)
Chloride: 102 mmol/L (ref 98–111)
Creatinine: 0.68 mg/dL (ref 0.44–1.00)
GFR, Estimated: >60 mL/min (ref >60)
Glucose, Bld: 140 mg/dL — ABNORMAL HIGH (ref 70–99)
Potassium: 3.9 mmol/L (ref 3.5–5.1)
Sodium: 136 mmol/L (ref 135–145)
Total Bilirubin: 0.6 mg/dL (ref 0.3–1.2)
Total Protein: 7.1 g/dL (ref 6.5–8.1)

## 2021-05-11 LAB — CEA (ACCESS): CEA (CHCC): 2900.69 ng/mL — ABNORMAL HIGH (ref 0.00–5.00)

## 2021-05-11 LAB — CEA (IN HOUSE-CHCC): CEA (CHCC-In House): 1392.63 ng/mL — ABNORMAL HIGH (ref 0.00–5.00)

## 2021-05-11 MED ORDER — SODIUM CHLORIDE 0.9% FLUSH
10.0000 mL | INTRAVENOUS | Status: DC | PRN
  Administered 2021-05-11: 10 mL via INTRAVENOUS
  Filled 2021-05-11: qty 10

## 2021-05-11 MED ORDER — HEPARIN SOD (PORK) LOCK FLUSH 100 UNIT/ML IV SOLN
500.0000 [IU] | Freq: Once | INTRAVENOUS | Status: AC | PRN
  Administered 2021-05-11: 500 [IU] via INTRAVENOUS
  Filled 2021-05-11: qty 5

## 2021-05-11 NOTE — Progress Notes (Signed)
[place] OFFICE PROGRESS NOTE   Diagnosis: Colon cancer  INTERVAL HISTORY:   Yolanda Watson completed a cycle of FOLFIRI on 03/15/2021.  She did not receive G-CSF following chemotherapy.  No nausea/vomiting or diarrhea.  She feels well.  No complaint.  She decided against the next cycle of chemotherapy.  No pain.  No change in the left groin mass.  She continues smoking.  Objective:  Vital signs in last 24 hours:  Blood pressure (!) 159/88, pulse 86, temperature 98.2 F (36.8 C), temperature source Oral, resp. rate 18, height $RemoveBe'5\' 6"'lBaxUNDCv$  (1.676 m), weight 111 lb (50.3 kg), SpO2 100 %.    HEENT: No thrush or ulcers Lymphatics: 3-4 cm left inguinal node, approximately 2 cm wide, no right inguinal nodes Resp: Lungs clear bilaterally Cardio: Regular rate and rhythm GI: No mass, nontender, no hepatosplenomegaly Vascular: No leg edema   Portacath/PICC-without erythema  Lab Results:  Lab Results  Component Value Date   WBC 9.7 05/11/2021   HGB 14.0 05/11/2021   HCT 42.1 05/11/2021   MCV 98.8 05/11/2021   PLT 167 05/11/2021   NEUTROABS 5.1 05/11/2021    CMP  Lab Results  Component Value Date   NA 136 05/11/2021   K 3.9 05/11/2021   CL 102 05/11/2021   CO2 24 05/11/2021   GLUCOSE 140 (H) 05/11/2021   BUN 25 (H) 05/11/2021   CREATININE 0.68 05/11/2021   CALCIUM 8.9 05/11/2021   PROT 7.1 05/11/2021   ALBUMIN 4.2 05/11/2021   AST 22 05/11/2021   ALT 22 05/11/2021   ALKPHOS 176 (H) 05/11/2021   BILITOT 0.6 05/11/2021   GFRNONAA >60 05/11/2021   GFRAA >60 08/24/2020    Lab Results  Component Value Date   CEA1 1,248.68 (H) 02/21/2021     Medications: I have reviewed the patient's current medications.   Assessment/Plan: 1. Metastatic colon cancer presenting with an Abdominal/pelvic mass  2. Colon cancerdiagnosed in [place] in 2016-pathology report dated 02/15/2015-invasive adenocarcinoma, well-differentiated with prominent mucinous features;  microscopic tumor extension into mesenteric adipose tissue and focally involving visceral peritoneum; radial and mucosal margins negative; lymph-vascular invasion withsuspicious foci identified;perineural invasion not identified; 16 negative lymph nodes; tumor deposits not identified; pT4apN0;MSI stable,BRAF V600E, ATM, MSS, TMB indeterminant-8, RASwild-type  ? CT 04/22/2017 revealed a heterogenous anterior pelvic wall mass, right inguinal mass, top normal sized retroperitoneal and iliac nodes, abutment of the anterior dome of the bladder and a loop of small bowel ? 04/29/2017 biopsy of anterior abdominal/pelvic wall mass-acellular mucin dissecting fibrous stroma ? Cycle 1 FOLFOX 05/15/2017 ? Cycle 2 FOLFOX 05/28/2017 ? Cycle 3 FOLFOX with Avastin on 06/11/2017 ? Cycle 4 FOLFOX/Avastin 06/25/2017 ? Cycle 5 FOLFOX/Avastin 07/10/2017, Neulasta added ? Restaging CT 07/21/2017-mild decrease in the size of a right inguinal node and the anterior abdominal wall mass ? Radiation to the abdominal wall mass 08/18/2017-09/03/2017 ? Status post excision of abdominal wall mass 10/03/2017--pathology showed adenocarcinoma with excessive extracellular mucin. Margins negative for tumor. Right inguinal wall mass also excised showing metastatic adenocarcinoma with excessive extracellular mucin in 1 of 8 lymph nodes. ? CT abdomen/pelvis 06/16/2019-new right hepatic lobe lesion measuring 2.7 x 1.7 cm. Hypoattenuation within the pancreatic uncinate process measuring 1.5 x 1.6 cm. Mild left adrenal thickening. Upper pole left renal mass 3.0 x 3.6 cm. Infiltrative hypoenhancement involving the lower pole left kidney. Nonocclusive thrombus within the infrarenal IVC. Necrotic adenopathy within the small bowel mesentery, new. New necrotic left inguinal nodes. Lytic lesion within and adjacent the anterior  side of the L3 vertebral body. Contiguous mass within the left psoas measuring 4.6 x 3.4 cm. ? Cycle 1 FOLFIRI  06/23/2019 ? Cycle 2 FOLFIRI 07/08/2019, Udenyca added ? Cycle 3 FOLFIRI 07/22/2019 ? Cycle 4 FOLFIRI 08/04/2019 ? Cycle 5 FOLFIRI 08/18/2019 ? CTs 09/08/2019-3.4 x 2.9 cm metastasis in segment 7 liver, previously 2.7 x 1.7 cm. 14 mm lesion/implant along the inferior right hepatic lobe, possibly new or possibly related to the prior peritoneal implant along the right paracolic gutter. 4 x 6 mm subpleural nodule right lower lobe not clearly evident on the prior study. Near complete resolution of prior heterogeneous enhancement involving the bilateral kidneys which likely reflected pyelonephritis. Near complete resolution of prior lymphadenopathy along with small bowel mesentery. Mild left inguinal lymphadenopathy measuring up to 14 mm, previously 15 mm. Lytic metastasis at L3 with associated soft tissue metastasis involving the left psoas muscle improved. ? Cycle 6 FOLFIRI 09/16/2019, Udenyca ? Cycle 7 FOLFIRI 09/30/2019 ? Treatment break ? Cycle 8 FOLFIRI 01/06/2020 ? Cycle 9 FOLFIRI 01/26/2020 ? Cycle 10 FOLFIRI 02/24/2020 ? Cycle 11 FOLFIRI 03/16/2020 ? CT abdomen/pelvis 04/11/2020-posterior right liver lesion slightly larger, a second lesion in the inferior hepatic lobe is no longer present, portacaval nodes have decreased in size, slight decrease in size of left inguinal node, decreased L3 paraspinal mass ? CTs 09/25/2020-interval enlargement of a right liver mass, new 5 mm soft tissue nodule at the midline ventral peritoneum, no change in lytic L3 lesion, innumerable tiny lung nodules-likely inflammatory ? Cycle 1 FOLFIRI 10/05/2020 ? Cycle 2 FOLFIRI 10/19/2020 ? CTs 02/15/2021-enlargement of dominant right hepatic metastasis, enlargement of left inguinal node, stable density at the right paracolic gutter, resolution of omental nodule in the midline, new splenic infarct ? Cycle 1 FOLFIRI 02/21/2021 ? Cycle 2 FOLFIRI 03/15/2021 ? Cycle 3 FOLFIRI 6-22   3. Ongoing tobacco and alcohol use  4. Mild  neutropenia related to chemotherapy 07/10/2017. Neulasta added.  5.Admission January 2019 with an abdominal wall abscess and pubis osteomyelitis, status post debridement and antibiotics  6.Pain secondary to #1. 7.CT 06/16/2019 with nonocclusive thrombus infrarenal IVC. Anticoagulation initiated. 8.Left patella fracture following a fall 08/20/2019 9. Admission 04/11/2020 with a small bowel obstruction, exploratory laparotomy with repair of multiple serosal tears and small bowel resection 04/19/2020, closure of abdomen with application of wound VAC 04/21/2020, pathology from small bowel resection 04/19/2020-acute serositis      Disposition: Yolanda Watson appears unchanged.  There is no clinical evidence of progressive disease.  She has received fragmented treatment over the past year.  She understands the colon cancer will progress in the absence of further treatment.  She agrees to resume FOLFIRI on 05/31/2021.  She does not wish to receive G-CSF.  Ms. Arbie Cookey will return for an office visit and chemotherapy on 06/21/2021.  We will follow-up on the CEA today and check the CEA with each treatment.  I recommend she obtain a COVID-19 booster vaccine.  She does not wish to receive a vaccine at present.  Betsy Coder, MD  05/11/2021  10:19 AM

## 2021-05-14 ENCOUNTER — Other Ambulatory Visit (HOSPITAL_COMMUNITY): Payer: Self-pay

## 2021-05-14 MED FILL — Rivaroxaban Tab 20 MG: ORAL | 30 days supply | Qty: 30 | Fill #1 | Status: AC

## 2021-05-16 ENCOUNTER — Other Ambulatory Visit (HOSPITAL_COMMUNITY): Payer: Self-pay

## 2021-05-31 ENCOUNTER — Inpatient Hospital Stay: Payer: Medicare PPO | Attending: Nurse Practitioner

## 2021-05-31 ENCOUNTER — Inpatient Hospital Stay: Payer: Medicare PPO

## 2021-05-31 ENCOUNTER — Other Ambulatory Visit: Payer: Self-pay

## 2021-05-31 DIAGNOSIS — Z79899 Other long term (current) drug therapy: Secondary | ICD-10-CM | POA: Diagnosis not present

## 2021-05-31 DIAGNOSIS — C189 Malignant neoplasm of colon, unspecified: Secondary | ICD-10-CM | POA: Diagnosis not present

## 2021-05-31 DIAGNOSIS — Z5111 Encounter for antineoplastic chemotherapy: Secondary | ICD-10-CM | POA: Diagnosis not present

## 2021-05-31 LAB — CMP (CANCER CENTER ONLY)
ALT: 19 U/L (ref 0–44)
AST: 23 U/L (ref 15–41)
Albumin: 4 g/dL (ref 3.5–5.0)
Alkaline Phosphatase: 162 U/L — ABNORMAL HIGH (ref 38–126)
Anion gap: 9 (ref 5–15)
BUN: 17 mg/dL (ref 8–23)
CO2: 25 mmol/L (ref 22–32)
Calcium: 8.8 mg/dL — ABNORMAL LOW (ref 8.9–10.3)
Chloride: 102 mmol/L (ref 98–111)
Creatinine: 0.62 mg/dL (ref 0.44–1.00)
GFR, Estimated: >60 mL/min (ref >60)
Glucose, Bld: 101 mg/dL — ABNORMAL HIGH (ref 70–99)
Potassium: 3.9 mmol/L (ref 3.5–5.1)
Sodium: 136 mmol/L (ref 135–145)
Total Bilirubin: 0.6 mg/dL (ref 0.3–1.2)
Total Protein: 7.1 g/dL (ref 6.5–8.1)

## 2021-05-31 LAB — CBC (CANCER CENTER ONLY)
HCT: 41.7 % (ref 36.0–46.0)
Hemoglobin: 14 g/dL (ref 12.0–15.0)
MCH: 32.8 pg (ref 26.0–34.0)
MCHC: 33.6 g/dL (ref 30.0–36.0)
MCV: 97.7 fL (ref 80.0–100.0)
Platelet Count: 147 10*3/uL — ABNORMAL LOW (ref 150–400)
RBC: 4.27 MIL/uL (ref 3.87–5.11)
RDW: 12.8 % (ref 11.5–15.5)
WBC Count: 9.4 10*3/uL (ref 4.0–10.5)
nRBC: 0 % (ref 0.0–0.2)

## 2021-05-31 MED ORDER — FLUOROURACIL CHEMO INJECTION 2.5 GM/50ML
400.0000 mg/m2 | Freq: Once | INTRAVENOUS | Status: AC
  Administered 2021-05-31: 600 mg via INTRAVENOUS
  Filled 2021-05-31: qty 12

## 2021-05-31 MED ORDER — SODIUM CHLORIDE 0.9 % IV SOLN
10.0000 mg | Freq: Once | INTRAVENOUS | Status: AC
  Administered 2021-05-31: 10 mg via INTRAVENOUS
  Filled 2021-05-31: qty 1

## 2021-05-31 MED ORDER — SODIUM CHLORIDE 0.9 % IV SOLN
Freq: Once | INTRAVENOUS | Status: AC
Start: 2021-05-31 — End: 2021-05-31
  Filled 2021-05-31: qty 250

## 2021-05-31 MED ORDER — LEUCOVORIN CALCIUM INJECTION 350 MG
400.0000 mg/m2 | Freq: Once | INTRAVENOUS | Status: AC
  Administered 2021-05-31: 612 mg via INTRAVENOUS
  Filled 2021-05-31: qty 30.6

## 2021-05-31 MED ORDER — PALONOSETRON HCL INJECTION 0.25 MG/5ML
0.2500 mg | Freq: Once | INTRAVENOUS | Status: AC
  Administered 2021-05-31: 0.25 mg via INTRAVENOUS
  Filled 2021-05-31: qty 5

## 2021-05-31 MED ORDER — IRINOTECAN HCL CHEMO INJECTION 100 MG/5ML
180.0000 mg/m2 | Freq: Once | INTRAVENOUS | Status: AC
  Administered 2021-05-31: 280 mg via INTRAVENOUS
  Filled 2021-05-31: qty 14

## 2021-05-31 MED ORDER — ATROPINE SULFATE 1 MG/ML IJ SOLN
INTRAMUSCULAR | Status: AC
  Filled 2021-05-31: qty 1

## 2021-05-31 MED ORDER — ATROPINE SULFATE 1 MG/ML IJ SOLN
0.4000 mg | Freq: Once | INTRAMUSCULAR | Status: AC
  Administered 2021-05-31: 0.4 mg via INTRAVENOUS

## 2021-05-31 MED ORDER — ATROPINE SULFATE 0.4 MG/ML IJ SOLN
0.4000 mg | Freq: Once | INTRAMUSCULAR | Status: AC | PRN
  Filled 2021-05-31: qty 1

## 2021-05-31 MED ORDER — SODIUM CHLORIDE 0.9 % IV SOLN
2400.0000 mg/m2 | INTRAVENOUS | Status: DC
  Administered 2021-05-31: 3650 mg via INTRAVENOUS
  Filled 2021-05-31: qty 73

## 2021-05-31 NOTE — Patient Instructions (Signed)
[facility]  Discharge Instructions: Thank you for choosing [facility] to provide your oncology and hematology care.   If you have a lab appointment with the [facility], please go directly to the [facility] and check in at the registration area.   Wear comfortable clothing and clothing appropriate for easy access to any Portacath or PICC line.   We strive to give you quality time with your provider. You may need to reschedule your appointment if you arrive late (15 or more minutes).  Arriving late affects you and other patients whose appointments are after yours.  Also, if you miss three or more appointments without notifying the office, you may be dismissed from the clinic at the provider's discretion.      For prescription refill requests, have your pharmacy contact our office and allow 72 hours for refills to be completed.    Today you received the following chemotherapy and/or immunotherapy agents Irinotecan, Leucovorin, 5FU      To help prevent nausea and vomiting after your treatment, we encourage you to take your nausea medication as directed.  BELOW ARE SYMPTOMS THAT SHOULD BE REPORTED IMMEDIATELY: . *FEVER GREATER THAN 100.4 F (38 C) OR HIGHER . *CHILLS OR SWEATING . *NAUSEA AND VOMITING THAT IS NOT CONTROLLED WITH YOUR NAUSEA MEDICATION . *UNUSUAL SHORTNESS OF BREATH . *UNUSUAL BRUISING OR BLEEDING . *URINARY PROBLEMS (pain or burning when urinating, or frequent urination) . *BOWEL PROBLEMS (unusual diarrhea, constipation, pain near the anus) . TENDERNESS IN MOUTH AND THROAT WITH OR WITHOUT PRESENCE OF ULCERS (sore throat, sores in mouth, or a toothache) . UNUSUAL RASH, SWELLING OR PAIN  . UNUSUAL VAGINAL DISCHARGE OR ITCHING   Items with * indicate a potential emergency and should be followed up as soon as possible or go to the Emergency Department if any problems should occur.  Please show the CHEMOTHERAPY ALERT CARD or  IMMUNOTHERAPY ALERT CARD at check-in to the Emergency Department and triage nurse.  Should you have questions after your visit or need to cancel or reschedule your appointment, please contact [facility]  Dept: [phone]  and follow the prompts.  Office hours are 8:00 a.m. to 4:30 p.m. Monday - Friday. Please note that voicemails left after 4:00 p.m. may not be returned until the following business day.  We are closed weekends and major holidays. You have access to a nurse at all times for urgent questions. Please call the main number to the clinic Dept: [phone] and follow the prompts.   For any non-urgent questions, you may also contact your provider using MyChart. We now offer e-Visits for anyone 24 and older to request care online for non-urgent symptoms. For details visit [email or web address].   Also download the MyChart app! Go to the app store, search "MyChart", open the app, select [facility], and log in with your MyChart username and password.  Due to Covid, a mask is required upon entering the hospital/clinic. If you do not have a mask, one will be given to you upon arrival. For doctor visits, patients may have 1 support person aged 69 or older with them. For treatment visits, patients cannot have anyone with them due to current Covid guidelines and our immunocompromised population.

## 2021-06-02 ENCOUNTER — Inpatient Hospital Stay: Payer: Medicare PPO

## 2021-06-02 ENCOUNTER — Other Ambulatory Visit: Payer: Self-pay

## 2021-06-02 VITALS — BP 161/73 | HR 85 | Temp 97.7°F | Resp 17

## 2021-06-02 DIAGNOSIS — C189 Malignant neoplasm of colon, unspecified: Secondary | ICD-10-CM | POA: Diagnosis not present

## 2021-06-02 DIAGNOSIS — Z5111 Encounter for antineoplastic chemotherapy: Secondary | ICD-10-CM | POA: Diagnosis not present

## 2021-06-02 DIAGNOSIS — Z79899 Other long term (current) drug therapy: Secondary | ICD-10-CM | POA: Diagnosis not present

## 2021-06-02 MED ORDER — HEPARIN SOD (PORK) LOCK FLUSH 100 UNIT/ML IV SOLN
500.0000 [IU] | Freq: Once | INTRAVENOUS | Status: AC | PRN
  Administered 2021-06-02: 500 [IU]
  Filled 2021-06-02: qty 5

## 2021-06-02 MED ORDER — SODIUM CHLORIDE 0.9% FLUSH
10.0000 mL | INTRAVENOUS | Status: DC | PRN
  Administered 2021-06-02: 10 mL
  Filled 2021-06-02: qty 10

## 2021-06-02 NOTE — Patient Instructions (Signed)

## 2021-06-15 ENCOUNTER — Other Ambulatory Visit (HOSPITAL_COMMUNITY): Payer: Self-pay

## 2021-06-15 ENCOUNTER — Encounter: Payer: Self-pay | Admitting: Oncology

## 2021-06-15 ENCOUNTER — Other Ambulatory Visit: Payer: Self-pay

## 2021-06-15 ENCOUNTER — Other Ambulatory Visit: Payer: Self-pay | Admitting: Oncology

## 2021-06-15 DIAGNOSIS — C189 Malignant neoplasm of colon, unspecified: Secondary | ICD-10-CM

## 2021-06-15 DIAGNOSIS — C787 Secondary malignant neoplasm of liver and intrahepatic bile duct: Secondary | ICD-10-CM

## 2021-06-15 MED ORDER — RIVAROXABAN 20 MG PO TABS
ORAL_TABLET | ORAL | 2 refills | Status: DC
  Filled 2021-06-15: qty 30, 30d supply, fill #0
  Filled 2021-07-16: qty 30, 30d supply, fill #1
  Filled 2021-08-14: qty 30, 30d supply, fill #2

## 2021-06-16 ENCOUNTER — Other Ambulatory Visit (HOSPITAL_COMMUNITY): Payer: Self-pay

## 2021-06-16 ENCOUNTER — Other Ambulatory Visit: Payer: Self-pay | Admitting: Oncology

## 2021-06-21 ENCOUNTER — Inpatient Hospital Stay: Payer: Medicare PPO

## 2021-06-21 ENCOUNTER — Inpatient Hospital Stay (HOSPITAL_BASED_OUTPATIENT_CLINIC_OR_DEPARTMENT_OTHER): Payer: Medicare PPO | Admitting: Nurse Practitioner

## 2021-06-21 ENCOUNTER — Other Ambulatory Visit: Payer: Self-pay

## 2021-06-21 ENCOUNTER — Encounter: Payer: Self-pay | Admitting: Nurse Practitioner

## 2021-06-21 VITALS — BP 154/65 | HR 87 | Temp 97.8°F | Resp 20 | Ht 66.0 in | Wt 110.6 lb

## 2021-06-21 DIAGNOSIS — C189 Malignant neoplasm of colon, unspecified: Secondary | ICD-10-CM

## 2021-06-21 DIAGNOSIS — Z5111 Encounter for antineoplastic chemotherapy: Secondary | ICD-10-CM | POA: Diagnosis not present

## 2021-06-21 DIAGNOSIS — Z79899 Other long term (current) drug therapy: Secondary | ICD-10-CM | POA: Diagnosis not present

## 2021-06-21 DIAGNOSIS — C787 Secondary malignant neoplasm of liver and intrahepatic bile duct: Secondary | ICD-10-CM

## 2021-06-21 LAB — CMP (CANCER CENTER ONLY)
ALT: 18 U/L (ref 0–44)
AST: 20 U/L (ref 15–41)
Albumin: 4.1 g/dL (ref 3.5–5.0)
Alkaline Phosphatase: 172 U/L — ABNORMAL HIGH (ref 38–126)
Anion gap: 7 (ref 5–15)
BUN: 16 mg/dL (ref 8–23)
CO2: 26 mmol/L (ref 22–32)
Calcium: 9 mg/dL (ref 8.9–10.3)
Chloride: 103 mmol/L (ref 98–111)
Creatinine: 0.61 mg/dL (ref 0.44–1.00)
GFR, Estimated: >60 mL/min (ref >60)
Glucose, Bld: 129 mg/dL — ABNORMAL HIGH (ref 70–99)
Potassium: 3.8 mmol/L (ref 3.5–5.1)
Sodium: 136 mmol/L (ref 135–145)
Total Bilirubin: 0.6 mg/dL (ref 0.3–1.2)
Total Protein: 6.8 g/dL (ref 6.5–8.1)

## 2021-06-21 LAB — CBC WITH DIFFERENTIAL (CANCER CENTER ONLY)
Abs Immature Granulocytes: 0.02 10*3/uL (ref 0.00–0.07)
Basophils Absolute: 0.1 10*3/uL (ref 0.0–0.1)
Basophils Relative: 2 %
Eosinophils Absolute: 1 10*3/uL — ABNORMAL HIGH (ref 0.0–0.5)
Eosinophils Relative: 22 %
HCT: 40 % (ref 36.0–46.0)
Hemoglobin: 13.2 g/dL (ref 12.0–15.0)
Immature Granulocytes: 0 %
Lymphocytes Relative: 33 %
Lymphs Abs: 1.5 10*3/uL (ref 0.7–4.0)
MCH: 32.1 pg (ref 26.0–34.0)
MCHC: 33 g/dL (ref 30.0–36.0)
MCV: 97.3 fL (ref 80.0–100.0)
Monocytes Absolute: 0.5 10*3/uL (ref 0.1–1.0)
Monocytes Relative: 11 %
Neutro Abs: 1.5 10*3/uL — ABNORMAL LOW (ref 1.7–7.7)
Neutrophils Relative %: 32 %
Platelet Count: 137 10*3/uL — ABNORMAL LOW (ref 150–400)
RBC: 4.11 MIL/uL (ref 3.87–5.11)
RDW: 12.9 % (ref 11.5–15.5)
WBC Count: 4.6 10*3/uL (ref 4.0–10.5)
nRBC: 0 % (ref 0.0–0.2)

## 2021-06-21 LAB — CEA (ACCESS): CEA (CHCC): 2714.82 ng/mL — ABNORMAL HIGH (ref 0.00–5.00)

## 2021-06-21 LAB — CEA (IN HOUSE-CHCC): CEA (CHCC-In House): 1454.46 ng/mL — ABNORMAL HIGH (ref 0.00–5.00)

## 2021-06-21 MED ORDER — ATROPINE SULFATE 1 MG/ML IJ SOLN
0.4000 mg | Freq: Once | INTRAMUSCULAR | Status: AC
Start: 2021-06-21 — End: 2021-06-21
  Administered 2021-06-21: 0.4 mg via INTRAVENOUS

## 2021-06-21 MED ORDER — SODIUM CHLORIDE 0.9 % IV SOLN
Freq: Once | INTRAVENOUS | Status: AC
Start: 2021-06-21 — End: 2021-06-21
  Filled 2021-06-21: qty 250

## 2021-06-21 MED ORDER — SODIUM CHLORIDE 0.9 % IV SOLN
180.0000 mg/m2 | Freq: Once | INTRAVENOUS | Status: AC
  Administered 2021-06-21: 280 mg via INTRAVENOUS
  Filled 2021-06-21: qty 14

## 2021-06-21 MED ORDER — SODIUM CHLORIDE 0.9 % IV SOLN
400.0000 mg/m2 | Freq: Once | INTRAVENOUS | Status: AC
  Administered 2021-06-21: 612 mg via INTRAVENOUS
  Filled 2021-06-21: qty 30.6

## 2021-06-21 MED ORDER — FLUOROURACIL CHEMO INJECTION 2.5 GM/50ML
400.0000 mg/m2 | Freq: Once | INTRAVENOUS | Status: AC
  Administered 2021-06-21: 600 mg via INTRAVENOUS
  Filled 2021-06-21: qty 12

## 2021-06-21 MED ORDER — SODIUM CHLORIDE 0.9 % IV SOLN
10.0000 mg | Freq: Once | INTRAVENOUS | Status: AC
  Administered 2021-06-21: 10 mg via INTRAVENOUS
  Filled 2021-06-21: qty 1

## 2021-06-21 MED ORDER — PALONOSETRON HCL INJECTION 0.25 MG/5ML
0.2500 mg | Freq: Once | INTRAVENOUS | Status: AC
  Administered 2021-06-21: 0.25 mg via INTRAVENOUS

## 2021-06-21 MED ORDER — SODIUM CHLORIDE 0.9 % IV SOLN
2400.0000 mg/m2 | INTRAVENOUS | Status: DC
  Administered 2021-06-21: 3650 mg via INTRAVENOUS
  Filled 2021-06-21: qty 73

## 2021-06-21 MED ORDER — ATROPINE SULFATE 0.4 MG/ML IJ SOLN
0.4000 mg | Freq: Once | INTRAMUSCULAR | Status: AC | PRN
  Filled 2021-06-21: qty 1

## 2021-06-21 NOTE — Progress Notes (Signed)
[facility] OFFICE PROGRESS NOTE   Diagnosis: Colon cancer  INTERVAL HISTORY:   Yolanda Watson returns as scheduled.  She resumed treatment with FOLFIRI 05/31/2021.  She felt "puny" for 3 to 4 days after treatment.  No nausea or vomiting.  No mouth sores.  No diarrhea.  Objective:  Vital signs in last 24 hours:  Blood pressure (!) 154/65, pulse 87, temperature 97.8 F (36.6 C), temperature source Oral, resp. rate 20, height _0  (1.676 m), weight 110 lb 9.6 oz (50.2 kg), SpO2 99 %.    HEENT: No thrush or ulcers. Lymphatics: 3 to 4 cm left inguinal lymph node. Resp: Lungs clear bilaterally. Cardio: Regular rate and rhythm. GI: Abdomen soft and nontender.  No hepatomegaly. Vascular: No leg edema. Port-A-Cath without erythema.  Lab Results:  Lab Results  Component Value Date   WBC 9.4 05/31/2021   HGB 14.0 05/31/2021   HCT 41.7 05/31/2021   MCV 97.7 05/31/2021   PLT 147 (L) 05/31/2021   NEUTROABS 5.1 05/11/2021    Imaging:  No results found.  Medications: I have reviewed the patient's current medications.  Assessment/Plan: Metastatic colon cancer presenting with an Abdominal/pelvic mass      Colon cancer diagnosed in [place] in 2016-pathology report dated 02/15/2015-invasive adenocarcinoma, well-differentiated with prominent mucinous features; microscopic tumor extension into mesenteric adipose tissue and focally involving visceral peritoneum; radial and mucosal margins negative; lymph-vascular invasion with suspicious foci identified;perineural invasion not identified; 16 negative lymph nodes; tumor deposits not identified; pT4apN0; MSI stable,BRAF V600E, ATM, MSS, TMB indeterminant-8, RAS wild-type   CT 04/22/2017 revealed a heterogenous anterior pelvic wall mass, right inguinal mass, top normal sized retroperitoneal and iliac nodes, abutment of the anterior dome of the bladder and a loop of small bowel 04/29/2017 biopsy of anterior abdominal/pelvic wall  mass-acellular mucin dissecting fibrous stroma Cycle 1 FOLFOX 05/15/2017 Cycle 2 FOLFOX 05/28/2017 Cycle 3 FOLFOX with Avastin on 06/11/2017 Cycle 4 FOLFOX/Avastin 06/25/2017 Cycle 5 FOLFOX/Avastin 07/10/2017, Neulasta added Restaging CT 07/21/2017-mild decrease in the size of a right inguinal node and the anterior abdominal wall mass Radiation to the abdominal wall mass 08/18/2017-09/03/2017 Status post excision of abdominal wall mass 10/03/2017--pathology showed adenocarcinoma with excessive extracellular mucin. Margins negative for tumor. Right inguinal wall mass also excised showing metastatic adenocarcinoma with excessive extracellular mucin in 1 of 8 lymph nodes.  CT abdomen/pelvis 06/16/2019- new right hepatic lobe lesion measuring 2.7 x 1.7 cm. Hypoattenuation within the pancreatic uncinate process measuring 1.5 x 1.6 cm.  Mild left adrenal thickening.  Upper pole left renal mass 3.0 x 3.6 cm.  Infiltrative hypoenhancement involving the lower pole left kidney.  Nonocclusive thrombus within the infrarenal IVC.  Necrotic adenopathy within the small bowel mesentery, new.  New necrotic left inguinal nodes.  Lytic lesion within and adjacent the anterior side of the L3 vertebral body.  Contiguous mass within the left psoas measuring 4.6 x 3.4 cm. Cycle 1 FOLFIRI 06/23/2019 Cycle 2 FOLFIRI 07/08/2019, Udenyca added  Cycle 3 FOLFIRI 07/22/2019 Cycle 4 FOLFIRI 08/04/2019 Cycle 5 FOLFIRI 08/18/2019 CTs 09/08/2019- 3.4 x 2.9 cm metastasis in segment 7 liver, previously 2.7 x 1.7 cm.  14 mm lesion/implant along the inferior right hepatic lobe, possibly new or possibly related to the prior peritoneal implant along the right paracolic gutter.  4 x 6 mm subpleural nodule right lower lobe not clearly evident on the prior study.  Near complete resolution of prior heterogeneous enhancement involving the bilateral kidneys which likely reflected pyelonephritis.  Near complete resolution  of prior lymphadenopathy along with  small bowel mesentery.  Mild left inguinal lymphadenopathy measuring up to 14 mm, previously 15 mm.  Lytic metastasis at L3 with associated soft tissue metastasis involving the left psoas muscle improved. Cycle 6 FOLFIRI 09/16/2019, Udenyca Cycle 7 FOLFIRI 09/30/2019 Treatment break Cycle 8 FOLFIRI 01/06/2020 Cycle 9 FOLFIRI 01/26/2020 Cycle 10 FOLFIRI 02/24/2020 Cycle 11 FOLFIRI 03/16/2020 CT abdomen/pelvis 04/11/2020-posterior right liver lesion slightly larger, a second lesion in the inferior hepatic lobe is no longer present, portacaval nodes have decreased in size, slight decrease in size of left inguinal node, decreased L3 paraspinal mass CTs 09/25/2020-interval enlargement of a right liver mass, new 5 mm soft tissue nodule at the midline ventral peritoneum, no change in lytic L3 lesion, innumerable tiny lung nodules-likely inflammatory Cycle 1 FOLFIRI 10/05/2020 Cycle 2 FOLFIRI 10/19/2020 CTs 02/15/2021-enlargement of dominant right hepatic metastasis, enlargement of left inguinal node, stable density at the right paracolic gutter, resolution of omental nodule in the midline, new splenic infarct Cycle 1 FOLFIRI 02/21/2021 Cycle 2 FOLFIRI 03/15/2021 Cycle 3 FOLFIRI 05/31/2021 Cycle 4 FOLFIRI 06/21/2021, Udenyca     3.  Ongoing tobacco and alcohol use   4.  Mild neutropenia related to chemotherapy 07/10/2017. Neulasta added.   5.  Admission January 2019 with an abdominal wall abscess and pubis osteomyelitis, status post debridement and antibiotics    6.         Pain secondary to #1. 7.         CT 06/16/2019 with nonocclusive thrombus infrarenal IVC.  Anticoagulation initiated. 8.  Left patella fracture following a fall 08/20/2019 9.  Admission 04/11/2020 with a small bowel obstruction, exploratory laparotomy with repair of multiple serosal tears and small bowel resection 04/19/2020, closure of abdomen with application of wound VAC 04/21/2020, pathology from small bowel resection 04/19/2020-acute  serositis    Disposition: Yolanda Watson appears stable.  She resumed treatment with FOLFIRI 05/31/2021.  She tolerated well.  Plan to proceed with FOLFIRI today as scheduled.  We reviewed the CBC from today.  Counts adequate to proceed with treatment.  I recommended white cell growth factor support on the day of pump discontinuation.  She has received this in the past.  She is unsure if she will agree to the injection when she is here on the day of pump discontinuation due to "muscle aches" in the past.  I discussed the rationale for the injection and reviewed neutropenic precautions with her.  She will take Claritin as has been previously discussed with her.  She will return for lab, follow-up, FOLFIRI in 3 weeks.  She will contact the office in the interim with any problems.  We specifically discussed fever, chills, other signs of infection.  Plan reviewed with Dr. Benay Spice.    Ned Card ANP/GNP-BC   06/21/2021  8:59 AM

## 2021-06-21 NOTE — Patient Instructions (Signed)
[facility]  Discharge Instructions: Thank you for choosing [facility] to provide your oncology and hematology care.   If you have a lab appointment with the [facility], please go directly to the [facility] and check in at the registration area.   Wear comfortable clothing and clothing appropriate for easy access to any Portacath or PICC line.   We strive to give you quality time with your provider. You may need to reschedule your appointment if you arrive late (15 or more minutes).  Arriving late affects you and other patients whose appointments are after yours.  Also, if you miss three or more appointments without notifying the office, you may be dismissed from the clinic at the provider's discretion.      For prescription refill requests, have your pharmacy contact our office and allow 72 hours for refills to be completed.    Today you received the following chemotherapy and/or immunotherapy agents Irinotecan, Leucovorin, 5FU      To help prevent nausea and vomiting after your treatment, we encourage you to take your nausea medication as directed.  BELOW ARE SYMPTOMS THAT SHOULD BE REPORTED IMMEDIATELY: *FEVER GREATER THAN 100.4 F (38 C) OR HIGHER *CHILLS OR SWEATING *NAUSEA AND VOMITING THAT IS NOT CONTROLLED WITH YOUR NAUSEA MEDICATION *UNUSUAL SHORTNESS OF BREATH *UNUSUAL BRUISING OR BLEEDING *URINARY PROBLEMS (pain or burning when urinating, or frequent urination) *BOWEL PROBLEMS (unusual diarrhea, constipation, pain near the anus) TENDERNESS IN MOUTH AND THROAT WITH OR WITHOUT PRESENCE OF ULCERS (sore throat, sores in mouth, or a toothache) UNUSUAL RASH, SWELLING OR PAIN  UNUSUAL VAGINAL DISCHARGE OR ITCHING   Items with * indicate a potential emergency and should be followed up as soon as possible or go to the Emergency Department if any problems should occur.  Please show the CHEMOTHERAPY ALERT CARD or IMMUNOTHERAPY ALERT CARD  at check-in to the Emergency Department and triage nurse.  Should you have questions after your visit or need to cancel or reschedule your appointment, please contact [facility]  Dept: [phone]  and follow the prompts.  Office hours are 8:00 a.m. to 4:30 p.m. Monday - Friday. Please note that voicemails left after 4:00 p.m. may not be returned until the following business day.  We are closed weekends and major holidays. You have access to a nurse at all times for urgent questions. Please call the main number to the clinic Dept: [phone] and follow the prompts.   For any non-urgent questions, you may also contact your provider using MyChart. We now offer e-Visits for anyone 36 and older to request care online for non-urgent symptoms. For details visit [email or web address].   Also download the MyChart app! Go to the app store, search "MyChart", open the app, select , and log in with your MyChart username and password.  Due to Covid, a mask is required upon entering the hospital/clinic. If you do not have a mask, one will be given to you upon arrival. For doctor visits, patients may have 1 support person aged 53 or older with them. For treatment visits, patients cannot have anyone with them due to current Covid guidelines and our immunocompromised population.

## 2021-06-23 ENCOUNTER — Other Ambulatory Visit: Payer: Self-pay

## 2021-06-23 ENCOUNTER — Inpatient Hospital Stay: Payer: Medicare PPO

## 2021-06-23 VITALS — BP 137/76 | HR 85 | Temp 97.6°F | Resp 16

## 2021-06-23 DIAGNOSIS — C787 Secondary malignant neoplasm of liver and intrahepatic bile duct: Secondary | ICD-10-CM

## 2021-06-23 DIAGNOSIS — Z79899 Other long term (current) drug therapy: Secondary | ICD-10-CM | POA: Diagnosis not present

## 2021-06-23 DIAGNOSIS — C189 Malignant neoplasm of colon, unspecified: Secondary | ICD-10-CM

## 2021-06-23 DIAGNOSIS — Z5111 Encounter for antineoplastic chemotherapy: Secondary | ICD-10-CM | POA: Diagnosis not present

## 2021-06-23 MED ORDER — PEGFILGRASTIM-CBQV 6 MG/0.6ML ~~LOC~~ SOSY
6.0000 mg | PREFILLED_SYRINGE | Freq: Once | SUBCUTANEOUS | Status: AC
  Administered 2021-06-23: 6 mg via SUBCUTANEOUS

## 2021-06-23 MED ORDER — SODIUM CHLORIDE 0.9% FLUSH
10.0000 mL | INTRAVENOUS | Status: DC | PRN
  Administered 2021-06-23: 10 mL
  Filled 2021-06-23: qty 10

## 2021-06-23 MED ORDER — HEPARIN SOD (PORK) LOCK FLUSH 100 UNIT/ML IV SOLN
500.0000 [IU] | Freq: Once | INTRAVENOUS | Status: AC | PRN
  Administered 2021-06-23: 500 [IU]
  Filled 2021-06-23: qty 5

## 2021-07-04 DIAGNOSIS — R112 Nausea with vomiting, unspecified: Secondary | ICD-10-CM | POA: Diagnosis not present

## 2021-07-04 DIAGNOSIS — R109 Unspecified abdominal pain: Secondary | ICD-10-CM | POA: Diagnosis not present

## 2021-07-04 DIAGNOSIS — C189 Malignant neoplasm of colon, unspecified: Secondary | ICD-10-CM | POA: Diagnosis not present

## 2021-07-04 DIAGNOSIS — K56609 Unspecified intestinal obstruction, unspecified as to partial versus complete obstruction: Secondary | ICD-10-CM | POA: Diagnosis not present

## 2021-07-05 ENCOUNTER — Encounter: Payer: Self-pay | Admitting: Oncology

## 2021-07-12 ENCOUNTER — Ambulatory Visit: Payer: Medicare PPO

## 2021-07-12 ENCOUNTER — Other Ambulatory Visit: Payer: Medicare PPO

## 2021-07-12 ENCOUNTER — Ambulatory Visit: Payer: Medicare PPO | Admitting: Oncology

## 2021-07-14 ENCOUNTER — Other Ambulatory Visit: Payer: Self-pay | Admitting: Oncology

## 2021-07-16 ENCOUNTER — Other Ambulatory Visit (HOSPITAL_COMMUNITY): Payer: Self-pay

## 2021-07-16 ENCOUNTER — Encounter: Payer: Self-pay | Admitting: Oncology

## 2021-07-19 ENCOUNTER — Inpatient Hospital Stay: Payer: Medicare PPO | Attending: Nurse Practitioner

## 2021-07-19 ENCOUNTER — Inpatient Hospital Stay: Payer: Medicare PPO

## 2021-07-19 ENCOUNTER — Inpatient Hospital Stay (HOSPITAL_BASED_OUTPATIENT_CLINIC_OR_DEPARTMENT_OTHER): Payer: Medicare PPO | Admitting: Oncology

## 2021-07-19 ENCOUNTER — Other Ambulatory Visit: Payer: Self-pay

## 2021-07-19 VITALS — BP 177/98 | HR 84 | Temp 97.4°F | Resp 16 | Wt 111.0 lb

## 2021-07-19 DIAGNOSIS — Z79899 Other long term (current) drug therapy: Secondary | ICD-10-CM | POA: Insufficient documentation

## 2021-07-19 DIAGNOSIS — C189 Malignant neoplasm of colon, unspecified: Secondary | ICD-10-CM | POA: Diagnosis not present

## 2021-07-19 DIAGNOSIS — C787 Secondary malignant neoplasm of liver and intrahepatic bile duct: Secondary | ICD-10-CM

## 2021-07-19 DIAGNOSIS — Z5111 Encounter for antineoplastic chemotherapy: Secondary | ICD-10-CM | POA: Diagnosis not present

## 2021-07-19 LAB — CBC WITH DIFFERENTIAL (CANCER CENTER ONLY)
Abs Immature Granulocytes: 0.03 10*3/uL (ref 0.00–0.07)
Basophils Absolute: 0.1 10*3/uL (ref 0.0–0.1)
Basophils Relative: 1 %
Eosinophils Absolute: 1 10*3/uL — ABNORMAL HIGH (ref 0.0–0.5)
Eosinophils Relative: 10 %
HCT: 37.6 % (ref 36.0–46.0)
Hemoglobin: 12.8 g/dL (ref 12.0–15.0)
Immature Granulocytes: 0 %
Lymphocytes Relative: 13 %
Lymphs Abs: 1.3 10*3/uL (ref 0.7–4.0)
MCH: 33.8 pg (ref 26.0–34.0)
MCHC: 34 g/dL (ref 30.0–36.0)
MCV: 99.2 fL (ref 80.0–100.0)
Monocytes Absolute: 0.8 10*3/uL (ref 0.1–1.0)
Monocytes Relative: 8 %
Neutro Abs: 6.9 10*3/uL (ref 1.7–7.7)
Neutrophils Relative %: 68 %
Platelet Count: 215 10*3/uL (ref 150–400)
RBC: 3.79 MIL/uL — ABNORMAL LOW (ref 3.87–5.11)
RDW: 14.9 % (ref 11.5–15.5)
WBC Count: 10.2 10*3/uL (ref 4.0–10.5)
nRBC: 0 % (ref 0.0–0.2)

## 2021-07-19 LAB — CMP (CANCER CENTER ONLY)
ALT: 20 U/L (ref 0–44)
AST: 25 U/L (ref 15–41)
Albumin: 4.1 g/dL (ref 3.5–5.0)
Alkaline Phosphatase: 205 U/L — ABNORMAL HIGH (ref 38–126)
Anion gap: 9 (ref 5–15)
BUN: 16 mg/dL (ref 8–23)
CO2: 26 mmol/L (ref 22–32)
Calcium: 8.6 mg/dL — ABNORMAL LOW (ref 8.9–10.3)
Chloride: 103 mmol/L (ref 98–111)
Creatinine: 0.61 mg/dL (ref 0.44–1.00)
GFR, Estimated: >60 mL/min (ref >60)
Glucose, Bld: 127 mg/dL — ABNORMAL HIGH (ref 70–99)
Potassium: 3.7 mmol/L (ref 3.5–5.1)
Sodium: 138 mmol/L (ref 135–145)
Total Bilirubin: 0.4 mg/dL (ref 0.3–1.2)
Total Protein: 6.7 g/dL (ref 6.5–8.1)

## 2021-07-19 LAB — CEA (ACCESS): CEA (CHCC): 2737.47 ng/mL — ABNORMAL HIGH (ref 0.00–5.00)

## 2021-07-19 MED ORDER — ATROPINE SULFATE 0.4 MG/ML IJ SOLN
0.4000 mg | Freq: Once | INTRAMUSCULAR | Status: AC | PRN
  Administered 2021-07-19: 0.4 mg via INTRAVENOUS
  Filled 2021-07-19: qty 1

## 2021-07-19 MED ORDER — IRINOTECAN HCL CHEMO INJECTION 100 MG/5ML
180.0000 mg/m2 | Freq: Once | INTRAVENOUS | Status: AC
  Administered 2021-07-19: 280 mg via INTRAVENOUS
  Filled 2021-07-19: qty 14

## 2021-07-19 MED ORDER — SODIUM CHLORIDE 0.9 % IV SOLN
2400.0000 mg/m2 | INTRAVENOUS | Status: DC
  Administered 2021-07-19: 3650 mg via INTRAVENOUS
  Filled 2021-07-19: qty 73

## 2021-07-19 MED ORDER — SODIUM CHLORIDE 0.9 % IV SOLN
10.0000 mg | Freq: Once | INTRAVENOUS | Status: AC
  Administered 2021-07-19: 10 mg via INTRAVENOUS
  Filled 2021-07-19: qty 1

## 2021-07-19 MED ORDER — FLUOROURACIL CHEMO INJECTION 2.5 GM/50ML
400.0000 mg/m2 | Freq: Once | INTRAVENOUS | Status: AC
  Administered 2021-07-19: 600 mg via INTRAVENOUS
  Filled 2021-07-19: qty 12

## 2021-07-19 MED ORDER — DEXTROSE 5 % IV SOLN
400.0000 mg/m2 | Freq: Once | INTRAVENOUS | Status: AC
  Administered 2021-07-19: 612 mg via INTRAVENOUS
  Filled 2021-07-19: qty 30.6

## 2021-07-19 MED ORDER — PALONOSETRON HCL INJECTION 0.25 MG/5ML
0.2500 mg | Freq: Once | INTRAVENOUS | Status: AC
  Administered 2021-07-19: 0.25 mg via INTRAVENOUS
  Filled 2021-07-19: qty 5

## 2021-07-19 MED ORDER — SODIUM CHLORIDE 0.9% FLUSH
10.0000 mL | INTRAVENOUS | Status: DC | PRN
  Filled 2021-07-19: qty 10

## 2021-07-19 MED ORDER — SODIUM CHLORIDE 0.9 % IV SOLN
Freq: Once | INTRAVENOUS | Status: AC
Start: 2021-07-19 — End: 2021-07-19
  Filled 2021-07-19: qty 250

## 2021-07-19 NOTE — Patient Instructions (Signed)
Implanted Port Home Guide An implanted port is a device that is placed under the skin. It is usually placed in the chest. The device can be used to give IV medicine, to take blood, or for dialysis. You may have an implanted port if: You need IV medicine that would be irritating to the small veins in your hands or arms. You need IV medicines, such as antibiotics, for a long period of time. You need IV nutrition for a long period of time. You need dialysis. When you have a port, your health care provider can choose to use the port instead of veins in your arms for these procedures. You may have fewer limitations when using a port than you would if you used other types of long-term IVs, and you will likely be able to return to normal activities afteryour incision heals. An implanted port has two main parts: Reservoir. The reservoir is the part where a needle is inserted to give medicines or draw blood. The reservoir is round. After it is placed, it appears as a small, raised area under your skin. Catheter. The catheter is a thin, flexible tube that connects the reservoir to a vein. Medicine that is inserted into the reservoir goes into the catheter and then into the vein. How is my port accessed? To access your port: A numbing cream may be placed on the skin over the port site. Your health care provider will put on a mask and sterile gloves. The skin over your port will be cleaned carefully with a germ-killing soap and allowed to dry. Your health care provider will gently pinch the port and insert a needle into it. Your health care provider will check for a blood return to make sure the port is in the vein and is not clogged. If your port needs to remain accessed to get medicine continuously (constant infusion), your health care provider will place a clear bandage (dressing) over the needle site. The dressing and needle will need to be changed every week, or as told by your health care provider. What  is flushing? Flushing helps keep the port from getting clogged. Follow instructions from your health care provider about how and when to flush the port. Ports are usually flushed with saline solution or a medicine called heparin. The need for flushing will depend on how the port is used: If the port is only used from time to time to give medicines or draw blood, the port may need to be flushed: Before and after medicines have been given. Before and after blood has been drawn. As part of routine maintenance. Flushing may be recommended every 4-6 weeks. If a constant infusion is running, the port may not need to be flushed. Throw away any syringes in a disposal container that is meant for sharp items (sharps container). You can buy a sharps container from a pharmacy, or you can make one by using an empty hard plastic bottle with a cover. How long will my port stay implanted? The port can stay in for as long as your health care provider thinks it is needed. When it is time for the port to come out, a surgery will be done to remove it. The surgery will be similar to the procedure that was done to putthe port in. Follow these instructions at home:  Flush your port as told by your health care provider. If you need an infusion over several days, follow instructions from your health care provider about how to take   care of your port site. Make sure you: Wash your hands with soap and water before you change your dressing. If soap and water are not available, use alcohol-based hand sanitizer. Change your dressing as told by your health care provider. Place any used dressings or infusion bags into a plastic bag. Throw that bag in the trash. Keep the dressing that covers the needle clean and dry. Do not get it wet. Do not use scissors or sharp objects near the tube. Keep the tube clamped, unless it is being used. Check your port site every day for signs of infection. Check for: Redness, swelling, or  pain. Fluid or blood. Pus or a bad smell. Protect the skin around the port site. Avoid wearing bra straps that rub or irritate the site. Protect the skin around your port from seat belts. Place a soft pad over your chest if needed. Bathe or shower as told by your health care provider. The site may get wet as long as you are not actively receiving an infusion. Return to your normal activities as told by your health care provider. Ask your health care provider what activities are safe for you. Carry a medical alert card or wear a medical alert bracelet at all times. This will let health care providers know that you have an implanted port in case of an emergency. Get help right away if: You have redness, swelling, or pain at the port site. You have fluid or blood coming from your port site. You have pus or a bad smell coming from the port site. You have a fever. Summary Implanted ports are usually placed in the chest for long-term IV access. Follow instructions from your health care provider about flushing the port and changing bandages (dressings). Take care of the area around your port by avoiding clothing that puts pressure on the area, and by watching for signs of infection. Protect the skin around your port from seat belts. Place a soft pad over your chest if needed. Get help right away if you have a fever or you have redness, swelling, pain, drainage, or a bad smell at the port site. This information is not intended to replace advice given to you by your health care provider. Make sure you discuss any questions you have with your healthcare provider. Document Revised: 05/01/2020 Document Reviewed: 05/01/2020 Elsevier Patient Education  2022 [employer]  

## 2021-07-19 NOTE — Progress Notes (Signed)
[facility] OFFICE PROGRESS NOTE   Diagnosis: Colon cancer  INTERVAL HISTORY:   Yolanda Watson returns as scheduled.  She completed another cycle of FOLFIRI on 06/21/2021.  No mouth sores, nausea, or diarrhea.  She had pain during the Udenyca injection on 06/23/2021.  No subsequent bone pain. She feels the left groin mass may be slightly larger.  No associated pain. Objective:  Vital signs in last 24 hours:  Blood pressure (!) 177/98, pulse 84, temperature (!) 97.4 F (36.3 C), resp. rate 16, weight 111 lb (50.3 kg), SpO2 100 %.    HEENT: No thrush or ulcers Lymphatics: Firm mass in the left inguinal region, approximately 4-5 cm Resp: Lungs clear bilaterally Cardio: Regular rate and rhythm GI: No hepatosplenomegaly Vascular: No leg edema  Portacath/PICC-without erythema  Lab Results:  Lab Results  Component Value Date   WBC 10.2 07/19/2021   HGB 12.8 07/19/2021   HCT 37.6 07/19/2021   MCV 99.2 07/19/2021   PLT 215 07/19/2021   NEUTROABS 6.9 07/19/2021    CMP  Lab Results  Component Value Date   NA 138 07/19/2021   K 3.7 07/19/2021   CL 103 07/19/2021   CO2 26 07/19/2021   GLUCOSE 127 (H) 07/19/2021   BUN 16 07/19/2021   CREATININE 0.61 07/19/2021   CALCIUM 8.6 (L) 07/19/2021   PROT 6.7 07/19/2021   ALBUMIN 4.1 07/19/2021   AST 25 07/19/2021   ALT 20 07/19/2021   ALKPHOS 205 (H) 07/19/2021   BILITOT 0.4 07/19/2021   GFRNONAA >60 07/19/2021   GFRAA >60 08/24/2020    Lab Results  Component Value Date   CEA1 1,454.46 (H) 06/21/2021   CEA 2,714.82 (H) 06/21/2021    Lab Results  Component Value Date   INR 0.9 04/12/2020   LABPROT 11.9 04/12/2020    Imaging:  No results found.  Medications: I have reviewed the patient's current medications.   Assessment/Plan: Metastatic colon cancer presenting with an Abdominal/pelvic mass      Colon cancer diagnosed in [place] in 2016-pathology report dated 02/15/2015-invasive adenocarcinoma,  well-differentiated with prominent mucinous features; microscopic tumor extension into mesenteric adipose tissue and focally involving visceral peritoneum; radial and mucosal margins negative; lymph-vascular invasion with suspicious foci identified;perineural invasion not identified; 16 negative lymph nodes; tumor deposits not identified; pT4apN0; MSI stable,BRAF V600E, ATM, MSS, TMB indeterminant-8, RAS wild-type   CT 04/22/2017 revealed a heterogenous anterior pelvic wall mass, right inguinal mass, top normal sized retroperitoneal and iliac nodes, abutment of the anterior dome of the bladder and a loop of small bowel 04/29/2017 biopsy of anterior abdominal/pelvic wall mass-acellular mucin dissecting fibrous stroma Cycle 1 FOLFOX 05/15/2017 Cycle 2 FOLFOX 05/28/2017 Cycle 3 FOLFOX with Avastin on 06/11/2017 Cycle 4 FOLFOX/Avastin 06/25/2017 Cycle 5 FOLFOX/Avastin 07/10/2017, Neulasta added Restaging CT 07/21/2017-mild decrease in the size of a right inguinal node and the anterior abdominal wall mass Radiation to the abdominal wall mass 08/18/2017-09/03/2017 Status post excision of abdominal wall mass 10/03/2017--pathology showed adenocarcinoma with excessive extracellular mucin. Margins negative for tumor. Right inguinal wall mass also excised showing metastatic adenocarcinoma with excessive extracellular mucin in 1 of 8 lymph nodes.  CT abdomen/pelvis 06/16/2019- new right hepatic lobe lesion measuring 2.7 x 1.7 cm. Hypoattenuation within the pancreatic uncinate process measuring 1.5 x 1.6 cm.  Mild left adrenal thickening.  Upper pole left renal mass 3.0 x 3.6 cm.  Infiltrative hypoenhancement involving the lower pole left kidney.  Nonocclusive thrombus within the infrarenal IVC.  Necrotic adenopathy within the small  bowel mesentery, new.  New necrotic left inguinal nodes.  Lytic lesion within and adjacent the anterior side of the L3 vertebral body.  Contiguous mass within the left psoas measuring 4.6  x 3.4 cm. Cycle 1 FOLFIRI 06/23/2019 Cycle 2 FOLFIRI 07/08/2019, Udenyca added  Cycle 3 FOLFIRI 07/22/2019 Cycle 4 FOLFIRI 08/04/2019 Cycle 5 FOLFIRI 08/18/2019 CTs 09/08/2019- 3.4 x 2.9 cm metastasis in segment 7 liver, previously 2.7 x 1.7 cm.  14 mm lesion/implant along the inferior right hepatic lobe, possibly new or possibly related to the prior peritoneal implant along the right paracolic gutter.  4 x 6 mm subpleural nodule right lower lobe not clearly evident on the prior study.  Near complete resolution of prior heterogeneous enhancement involving the bilateral kidneys which likely reflected pyelonephritis.  Near complete resolution of prior lymphadenopathy along with small bowel mesentery.  Mild left inguinal lymphadenopathy measuring up to 14 mm, previously 15 mm.  Lytic metastasis at L3 with associated soft tissue metastasis involving the left psoas muscle improved. Cycle 6 FOLFIRI 09/16/2019, Udenyca Cycle 7 FOLFIRI 09/30/2019 Treatment break Cycle 8 FOLFIRI 01/06/2020 Cycle 9 FOLFIRI 01/26/2020 Cycle 10 FOLFIRI 02/24/2020 Cycle 11 FOLFIRI 03/16/2020 CT abdomen/pelvis 04/11/2020-posterior right liver lesion slightly larger, a second lesion in the inferior hepatic lobe is no longer present, portacaval nodes have decreased in size, slight decrease in size of left inguinal node, decreased L3 paraspinal mass CTs 09/25/2020-interval enlargement of a right liver mass, new 5 mm soft tissue nodule at the midline ventral peritoneum, no change in lytic L3 lesion, innumerable tiny lung nodules-likely inflammatory Cycle 1 FOLFIRI 10/05/2020 Cycle 2 FOLFIRI 10/19/2020 CTs 02/15/2021-enlargement of dominant right hepatic metastasis, enlargement of left inguinal node, stable density at the right paracolic gutter, resolution of omental nodule in the midline, new splenic infarct Cycle 1 FOLFIRI 02/21/2021 Cycle 2 FOLFIRI 03/15/2021 Cycle 3 FOLFIRI 05/31/2021 Cycle 4 FOLFIRI 06/21/2021, Udenyca Cycle 5 FOLFIRI 07/19/2021      3.  Ongoing tobacco and alcohol use   4.  Mild neutropenia related to chemotherapy 07/10/2017. Neulasta added.   5.  Admission January 2019 with an abdominal wall abscess and pubis osteomyelitis, status post debridement and antibiotics    6.         Pain secondary to #1. 7.         CT 06/16/2019 with nonocclusive thrombus infrarenal IVC.  Anticoagulation initiated. 8.  Left patella fracture following a fall 08/20/2019 9.  Admission 04/11/2020 with a small bowel obstruction, exploratory laparotomy with repair of multiple serosal tears and small bowel resection 04/19/2020, closure of abdomen with application of wound VAC 04/21/2020, pathology from small bowel resection 04/19/2020-acute serositis      Disposition: [name] appears stable.  She will complete another cycle of FOLFIRI today.  We will follow-up on the CEA from today.  G-CSF will be held with this cycle.  We will reassess the need for continuing G-CSF when she is here for chemotherapy in 3 weeks.  She will return for an office visit in the next cycle of chemotherapy on 08/09/2021.  We will plan for a restaging CT after the next cycle of chemotherapy.  Betsy Coder, MD  07/19/2021  9:21 AM

## 2021-07-19 NOTE — Patient Instructions (Signed)
[facility]  Discharge Instructions: Thank you for choosing [facility] to provide your oncology and hematology care.   If you have a lab appointment with the [facility], please go directly to the [facility] and check in at the registration area.   Wear comfortable clothing and clothing appropriate for easy access to any Portacath or PICC line.   We strive to give you quality time with your provider. You may need to reschedule your appointment if you arrive late (15 or more minutes).  Arriving late affects you and other patients whose appointments are after yours.  Also, if you miss three or more appointments without notifying the office, you may be dismissed from the clinic at the provider's discretion.      For prescription refill requests, have your pharmacy contact our office and allow 72 hours for refills to be completed.    Today you received the following chemotherapy and/or immunotherapy agents Irinotecan, Leucovorin, 5FU      To help prevent nausea and vomiting after your treatment, we encourage you to take your nausea medication as directed.  BELOW ARE SYMPTOMS THAT SHOULD BE REPORTED IMMEDIATELY: *FEVER GREATER THAN 100.4 F (38 C) OR HIGHER *CHILLS OR SWEATING *NAUSEA AND VOMITING THAT IS NOT CONTROLLED WITH YOUR NAUSEA MEDICATION *UNUSUAL SHORTNESS OF BREATH *UNUSUAL BRUISING OR BLEEDING *URINARY PROBLEMS (pain or burning when urinating, or frequent urination) *BOWEL PROBLEMS (unusual diarrhea, constipation, pain near the anus) TENDERNESS IN MOUTH AND THROAT WITH OR WITHOUT PRESENCE OF ULCERS (sore throat, sores in mouth, or a toothache) UNUSUAL RASH, SWELLING OR PAIN  UNUSUAL VAGINAL DISCHARGE OR ITCHING   Items with * indicate a potential emergency and should be followed up as soon as possible or go to the Emergency Department if any problems should occur.  Please show the CHEMOTHERAPY ALERT CARD or IMMUNOTHERAPY ALERT CARD  at check-in to the Emergency Department and triage nurse.  Should you have questions after your visit or need to cancel or reschedule your appointment, please contact [facility]  Dept: [phone]  and follow the prompts.  Office hours are 8:00 a.m. to 4:30 p.m. Monday - Friday. Please note that voicemails left after 4:00 p.m. may not be returned until the following business day.  We are closed weekends and major holidays. You have access to a nurse at all times for urgent questions. Please call the main number to the clinic Dept: [phone] and follow the prompts.   For any non-urgent questions, you may also contact your provider using MyChart. We now offer e-Visits for anyone 69 and older to request care online for non-urgent symptoms. For details visit [email or web address].   Also download the MyChart app! Go to the app store, search "MyChart", open the app, select [facility], and log in with your MyChart username and password.  Due to Covid, a mask is required upon entering the hospital/clinic. If you do not have a mask, one will be given to you upon arrival. For doctor visits, patients may have 1 support person aged 69 or older with them. For treatment visits, patients cannot have anyone with them due to current Covid guidelines and our immunocompromised population.

## 2021-07-21 ENCOUNTER — Other Ambulatory Visit: Payer: Self-pay

## 2021-07-21 ENCOUNTER — Inpatient Hospital Stay: Payer: Medicare PPO

## 2021-07-21 VITALS — BP 142/77 | HR 82 | Temp 98.1°F

## 2021-07-21 DIAGNOSIS — C189 Malignant neoplasm of colon, unspecified: Secondary | ICD-10-CM

## 2021-07-21 DIAGNOSIS — Z79899 Other long term (current) drug therapy: Secondary | ICD-10-CM | POA: Diagnosis not present

## 2021-07-21 DIAGNOSIS — C787 Secondary malignant neoplasm of liver and intrahepatic bile duct: Secondary | ICD-10-CM

## 2021-07-21 DIAGNOSIS — Z5111 Encounter for antineoplastic chemotherapy: Secondary | ICD-10-CM | POA: Diagnosis not present

## 2021-07-21 MED ORDER — HEPARIN SOD (PORK) LOCK FLUSH 100 UNIT/ML IV SOLN
500.0000 [IU] | Freq: Once | INTRAVENOUS | Status: AC | PRN
  Administered 2021-07-21: 500 [IU]
  Filled 2021-07-21: qty 5

## 2021-07-21 MED ORDER — SODIUM CHLORIDE 0.9% FLUSH
10.0000 mL | INTRAVENOUS | Status: DC | PRN
  Administered 2021-07-21: 10 mL
  Filled 2021-07-21: qty 10

## 2021-08-02 ENCOUNTER — Other Ambulatory Visit (HOSPITAL_COMMUNITY): Payer: Self-pay

## 2021-08-02 ENCOUNTER — Other Ambulatory Visit: Payer: Self-pay | Admitting: Oncology

## 2021-08-04 ENCOUNTER — Other Ambulatory Visit: Payer: Self-pay | Admitting: Oncology

## 2021-08-04 DIAGNOSIS — R112 Nausea with vomiting, unspecified: Secondary | ICD-10-CM | POA: Diagnosis not present

## 2021-08-04 DIAGNOSIS — C189 Malignant neoplasm of colon, unspecified: Secondary | ICD-10-CM | POA: Diagnosis not present

## 2021-08-04 DIAGNOSIS — K56609 Unspecified intestinal obstruction, unspecified as to partial versus complete obstruction: Secondary | ICD-10-CM | POA: Diagnosis not present

## 2021-08-04 DIAGNOSIS — R109 Unspecified abdominal pain: Secondary | ICD-10-CM | POA: Diagnosis not present

## 2021-08-09 ENCOUNTER — Inpatient Hospital Stay: Payer: Medicare PPO

## 2021-08-09 ENCOUNTER — Inpatient Hospital Stay: Payer: Medicare PPO | Admitting: Nurse Practitioner

## 2021-08-14 ENCOUNTER — Encounter: Payer: Self-pay | Admitting: Oncology

## 2021-08-14 ENCOUNTER — Other Ambulatory Visit (HOSPITAL_COMMUNITY): Payer: Self-pay

## 2021-08-21 ENCOUNTER — Encounter: Payer: Self-pay | Admitting: Oncology

## 2021-08-21 ENCOUNTER — Other Ambulatory Visit (HOSPITAL_COMMUNITY): Payer: Self-pay

## 2021-08-22 ENCOUNTER — Encounter: Payer: Self-pay | Admitting: Oncology

## 2021-08-22 ENCOUNTER — Other Ambulatory Visit (HOSPITAL_COMMUNITY): Payer: Self-pay

## 2021-09-04 DIAGNOSIS — R109 Unspecified abdominal pain: Secondary | ICD-10-CM | POA: Diagnosis not present

## 2021-09-04 DIAGNOSIS — R112 Nausea with vomiting, unspecified: Secondary | ICD-10-CM | POA: Diagnosis not present

## 2021-09-04 DIAGNOSIS — K56609 Unspecified intestinal obstruction, unspecified as to partial versus complete obstruction: Secondary | ICD-10-CM | POA: Diagnosis not present

## 2021-09-04 DIAGNOSIS — C189 Malignant neoplasm of colon, unspecified: Secondary | ICD-10-CM | POA: Diagnosis not present

## 2021-09-08 ENCOUNTER — Other Ambulatory Visit: Payer: Self-pay | Admitting: Oncology

## 2021-09-12 ENCOUNTER — Encounter: Payer: Self-pay | Admitting: Oncology

## 2021-09-12 ENCOUNTER — Inpatient Hospital Stay: Payer: Medicare PPO

## 2021-09-12 ENCOUNTER — Other Ambulatory Visit: Payer: Self-pay | Admitting: Oncology

## 2021-09-12 ENCOUNTER — Other Ambulatory Visit (HOSPITAL_COMMUNITY): Payer: Self-pay

## 2021-09-12 ENCOUNTER — Inpatient Hospital Stay: Payer: Medicare PPO | Admitting: Nurse Practitioner

## 2021-09-12 DIAGNOSIS — C787 Secondary malignant neoplasm of liver and intrahepatic bile duct: Secondary | ICD-10-CM

## 2021-09-12 DIAGNOSIS — C189 Malignant neoplasm of colon, unspecified: Secondary | ICD-10-CM

## 2021-09-12 MED ORDER — RIVAROXABAN 20 MG PO TABS
ORAL_TABLET | ORAL | 0 refills | Status: DC
  Filled 2021-09-12: qty 30, 30d supply, fill #0

## 2021-09-14 ENCOUNTER — Inpatient Hospital Stay: Payer: Medicare PPO

## 2021-09-17 ENCOUNTER — Other Ambulatory Visit (HOSPITAL_COMMUNITY): Payer: Self-pay

## 2021-09-17 ENCOUNTER — Encounter: Payer: Self-pay | Admitting: Oncology

## 2021-09-19 ENCOUNTER — Inpatient Hospital Stay: Payer: Medicare PPO

## 2021-09-19 ENCOUNTER — Other Ambulatory Visit: Payer: Self-pay

## 2021-09-19 ENCOUNTER — Encounter: Payer: Self-pay | Admitting: Nurse Practitioner

## 2021-09-19 ENCOUNTER — Inpatient Hospital Stay: Payer: Medicare PPO | Attending: Nurse Practitioner

## 2021-09-19 ENCOUNTER — Inpatient Hospital Stay: Payer: Medicare PPO | Admitting: Nurse Practitioner

## 2021-09-19 ENCOUNTER — Other Ambulatory Visit (HOSPITAL_COMMUNITY): Payer: Self-pay

## 2021-09-19 VITALS — BP 172/87 | HR 91 | Temp 98.1°F | Resp 19 | Ht 66.0 in | Wt 111.0 lb

## 2021-09-19 DIAGNOSIS — F17210 Nicotine dependence, cigarettes, uncomplicated: Secondary | ICD-10-CM | POA: Diagnosis not present

## 2021-09-19 DIAGNOSIS — C189 Malignant neoplasm of colon, unspecified: Secondary | ICD-10-CM | POA: Diagnosis not present

## 2021-09-19 DIAGNOSIS — Z9221 Personal history of antineoplastic chemotherapy: Secondary | ICD-10-CM | POA: Insufficient documentation

## 2021-09-19 DIAGNOSIS — C787 Secondary malignant neoplasm of liver and intrahepatic bile duct: Secondary | ICD-10-CM

## 2021-09-19 DIAGNOSIS — C7989 Secondary malignant neoplasm of other specified sites: Secondary | ICD-10-CM | POA: Insufficient documentation

## 2021-09-19 LAB — CMP (CANCER CENTER ONLY)
ALT: 22 U/L (ref 0–44)
AST: 29 U/L (ref 15–41)
Albumin: 4 g/dL (ref 3.5–5.0)
Alkaline Phosphatase: 196 U/L — ABNORMAL HIGH (ref 38–126)
Anion gap: 10 (ref 5–15)
BUN: 10 mg/dL (ref 8–23)
CO2: 27 mmol/L (ref 22–32)
Calcium: 9.2 mg/dL (ref 8.9–10.3)
Chloride: 100 mmol/L (ref 98–111)
Creatinine: 0.6 mg/dL (ref 0.44–1.00)
GFR, Estimated: >60 mL/min (ref >60)
Glucose, Bld: 111 mg/dL — ABNORMAL HIGH (ref 70–99)
Potassium: 3.9 mmol/L (ref 3.5–5.1)
Sodium: 137 mmol/L (ref 135–145)
Total Bilirubin: 0.5 mg/dL (ref 0.3–1.2)
Total Protein: 7.2 g/dL (ref 6.5–8.1)

## 2021-09-19 LAB — CBC WITH DIFFERENTIAL (CANCER CENTER ONLY)
Abs Immature Granulocytes: 0.04 10*3/uL (ref 0.00–0.07)
Basophils Absolute: 0.1 10*3/uL (ref 0.0–0.1)
Basophils Relative: 1 %
Eosinophils Absolute: 2 10*3/uL — ABNORMAL HIGH (ref 0.0–0.5)
Eosinophils Relative: 25 %
HCT: 41.3 % (ref 36.0–46.0)
Hemoglobin: 13.4 g/dL (ref 12.0–15.0)
Immature Granulocytes: 1 %
Lymphocytes Relative: 17 %
Lymphs Abs: 1.4 10*3/uL (ref 0.7–4.0)
MCH: 32.4 pg (ref 26.0–34.0)
MCHC: 32.4 g/dL (ref 30.0–36.0)
MCV: 99.8 fL (ref 80.0–100.0)
Monocytes Absolute: 0.6 10*3/uL (ref 0.1–1.0)
Monocytes Relative: 7 %
Neutro Abs: 4.1 10*3/uL (ref 1.7–7.7)
Neutrophils Relative %: 49 %
Platelet Count: 243 10*3/uL (ref 150–400)
RBC: 4.14 MIL/uL (ref 3.87–5.11)
RDW: 12.9 % (ref 11.5–15.5)
WBC Count: 8.2 10*3/uL (ref 4.0–10.5)
nRBC: 0 % (ref 0.0–0.2)

## 2021-09-19 LAB — CEA (ACCESS): CEA (CHCC): 2540.6 ng/mL — ABNORMAL HIGH (ref 0.00–5.00)

## 2021-09-19 NOTE — Patient Instructions (Signed)
Implanted Port Home Guide An implanted port is a device that is placed under the skin. It is usually placed in the chest. The device can be used to give IV medicine, to take blood, or for dialysis. You may have an implanted port if: You need IV medicine that would be irritating to the small veins in your hands or arms. You need IV medicines, such as antibiotics, for a long period of time. You need IV nutrition for a long period of time. You need dialysis. When you have a port, your health care provider can choose to use the port instead of veins in your arms for these procedures. You may have fewer limitations when using a port than you would if you used other types of long-term IVs, and you will likely be able to return to normal activities after your incision heals. An implanted port has two main parts: Reservoir. The reservoir is the part where a needle is inserted to give medicines or draw blood. The reservoir is round. After it is placed, it appears as a small, raised area under your skin. Catheter. The catheter is a thin, flexible tube that connects the reservoir to a vein. Medicine that is inserted into the reservoir goes into the catheter and then into the vein. How is my port accessed? To access your port: A numbing cream may be placed on the skin over the port site. Your health care provider will put on a mask and sterile gloves. The skin over your port will be cleaned carefully with a germ-killing soap and allowed to dry. Your health care provider will gently pinch the port and insert a needle into it. Your health care provider will check for a blood return to make sure the port is in the vein and is not clogged. If your port needs to remain accessed to get medicine continuously (constant infusion), your health care provider will place a clear bandage (dressing) over the needle site. The dressing and needle will need to be changed every week, or as told by your health care provider. What  is flushing? Flushing helps keep the port from getting clogged. Follow instructions from your health care provider about how and when to flush the port. Ports are usually flushed with saline solution or a medicine called heparin. The need for flushing will depend on how the port is used: If the port is only used from time to time to give medicines or draw blood, the port may need to be flushed: Before and after medicines have been given. Before and after blood has been drawn. As part of routine maintenance. Flushing may be recommended every 4-6 weeks. If a constant infusion is running, the port may not need to be flushed. Throw away any syringes in a disposal container that is meant for sharp items (sharps container). You can buy a sharps container from a pharmacy, or you can make one by using an empty hard plastic bottle with a cover. How long will my port stay implanted? The port can stay in for as long as your health care provider thinks it is needed. When it is time for the port to come out, a surgery will be done to remove it. The surgery will be similar to the procedure that was done to put the port in. Follow these instructions at home:  Flush your port as told by your health care provider. If you need an infusion over several days, follow instructions from your health care provider about how   to take care of your port site. Make sure you: Wash your hands with soap and water before you change your dressing. If soap and water are not available, use alcohol-based hand sanitizer. Change your dressing as told by your health care provider. Place any used dressings or infusion bags into a plastic bag. Throw that bag in the trash. Keep the dressing that covers the needle clean and dry. Do not get it wet. Do not use scissors or sharp objects near the tube. Keep the tube clamped, unless it is being used. Check your port site every day for signs of infection. Check for: Redness, swelling, or  pain. Fluid or blood. Pus or a bad smell. Protect the skin around the port site. Avoid wearing bra straps that rub or irritate the site. Protect the skin around your port from seat belts. Place a soft pad over your chest if needed. Bathe or shower as told by your health care provider. The site may get wet as long as you are not actively receiving an infusion. Return to your normal activities as told by your health care provider. Ask your health care provider what activities are safe for you. Carry a medical alert card or wear a medical alert bracelet at all times. This will let health care providers know that you have an implanted port in case of an emergency. Get help right away if: You have redness, swelling, or pain at the port site. You have fluid or blood coming from your port site. You have pus or a bad smell coming from the port site. You have a fever. Summary Implanted ports are usually placed in the chest for long-term IV access. Follow instructions from your health care provider about flushing the port and changing bandages (dressings). Take care of the area around your port by avoiding clothing that puts pressure on the area, and by watching for signs of infection. Protect the skin around your port from seat belts. Place a soft pad over your chest if needed. Get help right away if you have a fever or you have redness, swelling, pain, drainage, or a bad smell at the port site. This information is not intended to replace advice given to you by your health care provider. Make sure you discuss any questions you have with your health care provider. Document Revised: 03/07/2021 Document Reviewed: 05/01/2020 Elsevier Patient Education  2022 Elsevier Inc.  

## 2021-09-19 NOTE — Progress Notes (Signed)
[facility] OFFICE PROGRESS NOTE   Diagnosis: Colon cancer  INTERVAL HISTORY:   Yolanda Watson returns for follow-up.  She was last seen 07/19/2021.  She completed a cycle of FOLFIRI that day.  She did not return for follow-up appointments.  She reports feeling well.  She has a good appetite.  No nausea or vomiting.  No diarrhea.  She thinks the lymph node mass in the left groin is stable.  Objective:  Vital signs in last 24 hours:  Blood pressure (!) 172/87, pulse 91, temperature 98.1 F (36.7 C), temperature source Oral, resp. rate 19, height $RemoveBe'5\' 6"'nHsXYfHPa$  (1.676 m), weight 111 lb (50.3 kg), SpO2 100 %.    HEENT: No thrush or ulcers. Lymphatics: Firm mass left inguinal region, approximately 6-7 cm. Resp: Lungs clear bilaterally. Cardio: Regular rate and rhythm. GI: Liver palpable right mid abdomen. Vascular: No leg edema. Port-A-Cath without erythema  Lab Results:  Lab Results  Component Value Date   WBC 8.2 09/19/2021   HGB 13.4 09/19/2021   HCT 41.3 09/19/2021   MCV 99.8 09/19/2021   PLT 243 09/19/2021   NEUTROABS 4.1 09/19/2021    Imaging:  No results found.  Medications: I have reviewed the patient's current medications.  Assessment/Plan: Metastatic colon cancer presenting with an Abdominal/pelvic mass      Colon cancer diagnosed in [place] in 2016-pathology report dated 02/15/2015-invasive adenocarcinoma, well-differentiated with prominent mucinous features; microscopic tumor extension into mesenteric adipose tissue and focally involving visceral peritoneum; radial and mucosal margins negative; lymph-vascular invasion with suspicious foci identified;perineural invasion not identified; 16 negative lymph nodes; tumor deposits not identified; pT4apN0; MSI stable,BRAF V600E, ATM, MSS, TMB indeterminant-8, RAS wild-type   CT 04/22/2017 revealed a heterogenous anterior pelvic wall mass, right inguinal mass, top normal sized retroperitoneal and iliac nodes,  abutment of the anterior dome of the bladder and a loop of small bowel 04/29/2017 biopsy of anterior abdominal/pelvic wall mass-acellular mucin dissecting fibrous stroma Cycle 1 FOLFOX 05/15/2017 Cycle 2 FOLFOX 05/28/2017 Cycle 3 FOLFOX with Avastin on 06/11/2017 Cycle 4 FOLFOX/Avastin 06/25/2017 Cycle 5 FOLFOX/Avastin 07/10/2017, Neulasta added Restaging CT 07/21/2017-mild decrease in the size of a right inguinal node and the anterior abdominal wall mass Radiation to the abdominal wall mass 08/18/2017-09/03/2017 Status post excision of abdominal wall mass 10/03/2017--pathology showed adenocarcinoma with excessive extracellular mucin. Margins negative for tumor. Right inguinal wall mass also excised showing metastatic adenocarcinoma with excessive extracellular mucin in 1 of 8 lymph nodes.  CT abdomen/pelvis 06/16/2019- new right hepatic lobe lesion measuring 2.7 x 1.7 cm. Hypoattenuation within the pancreatic uncinate process measuring 1.5 x 1.6 cm.  Mild left adrenal thickening.  Upper pole left renal mass 3.0 x 3.6 cm.  Infiltrative hypoenhancement involving the lower pole left kidney.  Nonocclusive thrombus within the infrarenal IVC.  Necrotic adenopathy within the small bowel mesentery, new.  New necrotic left inguinal nodes.  Lytic lesion within and adjacent the anterior side of the L3 vertebral body.  Contiguous mass within the left psoas measuring 4.6 x 3.4 cm. Cycle 1 FOLFIRI 06/23/2019 Cycle 2 FOLFIRI 07/08/2019, Udenyca added  Cycle 3 FOLFIRI 07/22/2019 Cycle 4 FOLFIRI 08/04/2019 Cycle 5 FOLFIRI 08/18/2019 CTs 09/08/2019- 3.4 x 2.9 cm metastasis in segment 7 liver, previously 2.7 x 1.7 cm.  14 mm lesion/implant along the inferior right hepatic lobe, possibly new or possibly related to the prior peritoneal implant along the right paracolic gutter.  4 x 6 mm subpleural nodule right lower lobe not clearly evident on the prior study.  Near complete resolution of prior heterogeneous enhancement involving  the bilateral kidneys which likely reflected pyelonephritis.  Near complete resolution of prior lymphadenopathy along with small bowel mesentery.  Mild left inguinal lymphadenopathy measuring up to 14 mm, previously 15 mm.  Lytic metastasis at L3 with associated soft tissue metastasis involving the left psoas muscle improved. Cycle 6 FOLFIRI 09/16/2019, Udenyca Cycle 7 FOLFIRI 09/30/2019 Treatment break Cycle 8 FOLFIRI 01/06/2020 Cycle 9 FOLFIRI 01/26/2020 Cycle 10 FOLFIRI 02/24/2020 Cycle 11 FOLFIRI 03/16/2020 CT abdomen/pelvis 04/11/2020-posterior right liver lesion slightly larger, a second lesion in the inferior hepatic lobe is no longer present, portacaval nodes have decreased in size, slight decrease in size of left inguinal node, decreased L3 paraspinal mass CTs 09/25/2020-interval enlargement of a right liver mass, new 5 mm soft tissue nodule at the midline ventral peritoneum, no change in lytic L3 lesion, innumerable tiny lung nodules-likely inflammatory Cycle 1 FOLFIRI 10/05/2020 Cycle 2 FOLFIRI 10/19/2020 CTs 02/15/2021-enlargement of dominant right hepatic metastasis, enlargement of left inguinal node, stable density at the right paracolic gutter, resolution of omental nodule in the midline, new splenic infarct Cycle 1 FOLFIRI 02/21/2021 Cycle 2 FOLFIRI 03/15/2021 Cycle 3 FOLFIRI 05/31/2021 Cycle 4 FOLFIRI 06/21/2021, Udenyca Cycle 5 FOLFIRI 07/19/2021     3.  Ongoing tobacco and alcohol use   4.  Mild neutropenia related to chemotherapy 07/10/2017. Neulasta added.   5.  Admission January 2019 with an abdominal wall abscess and pubis osteomyelitis, status post debridement and antibiotics    6.         Pain secondary to #1. 7.         CT 06/16/2019 with nonocclusive thrombus infrarenal IVC.  Anticoagulation initiated. 8.  Left patella fracture following a fall 08/20/2019 9.  Admission 04/11/2020 with a small bowel obstruction, exploratory laparotomy with repair of multiple serosal tears and small  bowel resection 04/19/2020, closure of abdomen with application of wound VAC 04/21/2020, pathology from small bowel resection 04/19/2020-acute serositis      Disposition: Yolanda Watson appears stable.  She was last treated with FOLFIRI 07/19/2021.  She did not return for subsequent follow-up appointments.  On exam today the left inguinal mass appears larger and liver is palpable.  We decided to hold today's treatment and refer for CT scans.  She will return for follow-up in 2 weeks.  Plan reviewed with Dr. Benay Spice.    Ned Card ANP/GNP-BC   09/19/2021  11:06 AM

## 2021-09-20 ENCOUNTER — Other Ambulatory Visit (HOSPITAL_COMMUNITY): Payer: Self-pay

## 2021-09-21 ENCOUNTER — Inpatient Hospital Stay: Payer: Medicare PPO

## 2021-09-21 IMAGING — DX DG ABD PORTABLE 1V
1 series · 1 of 1 positions shown · non-contrast
Comparison: 04/15/2020

CLINICAL DATA: Small-bowel obstruction.

EXAM:
PORTABLE ABDOMEN - 1 VIEW

[abdomen kub]
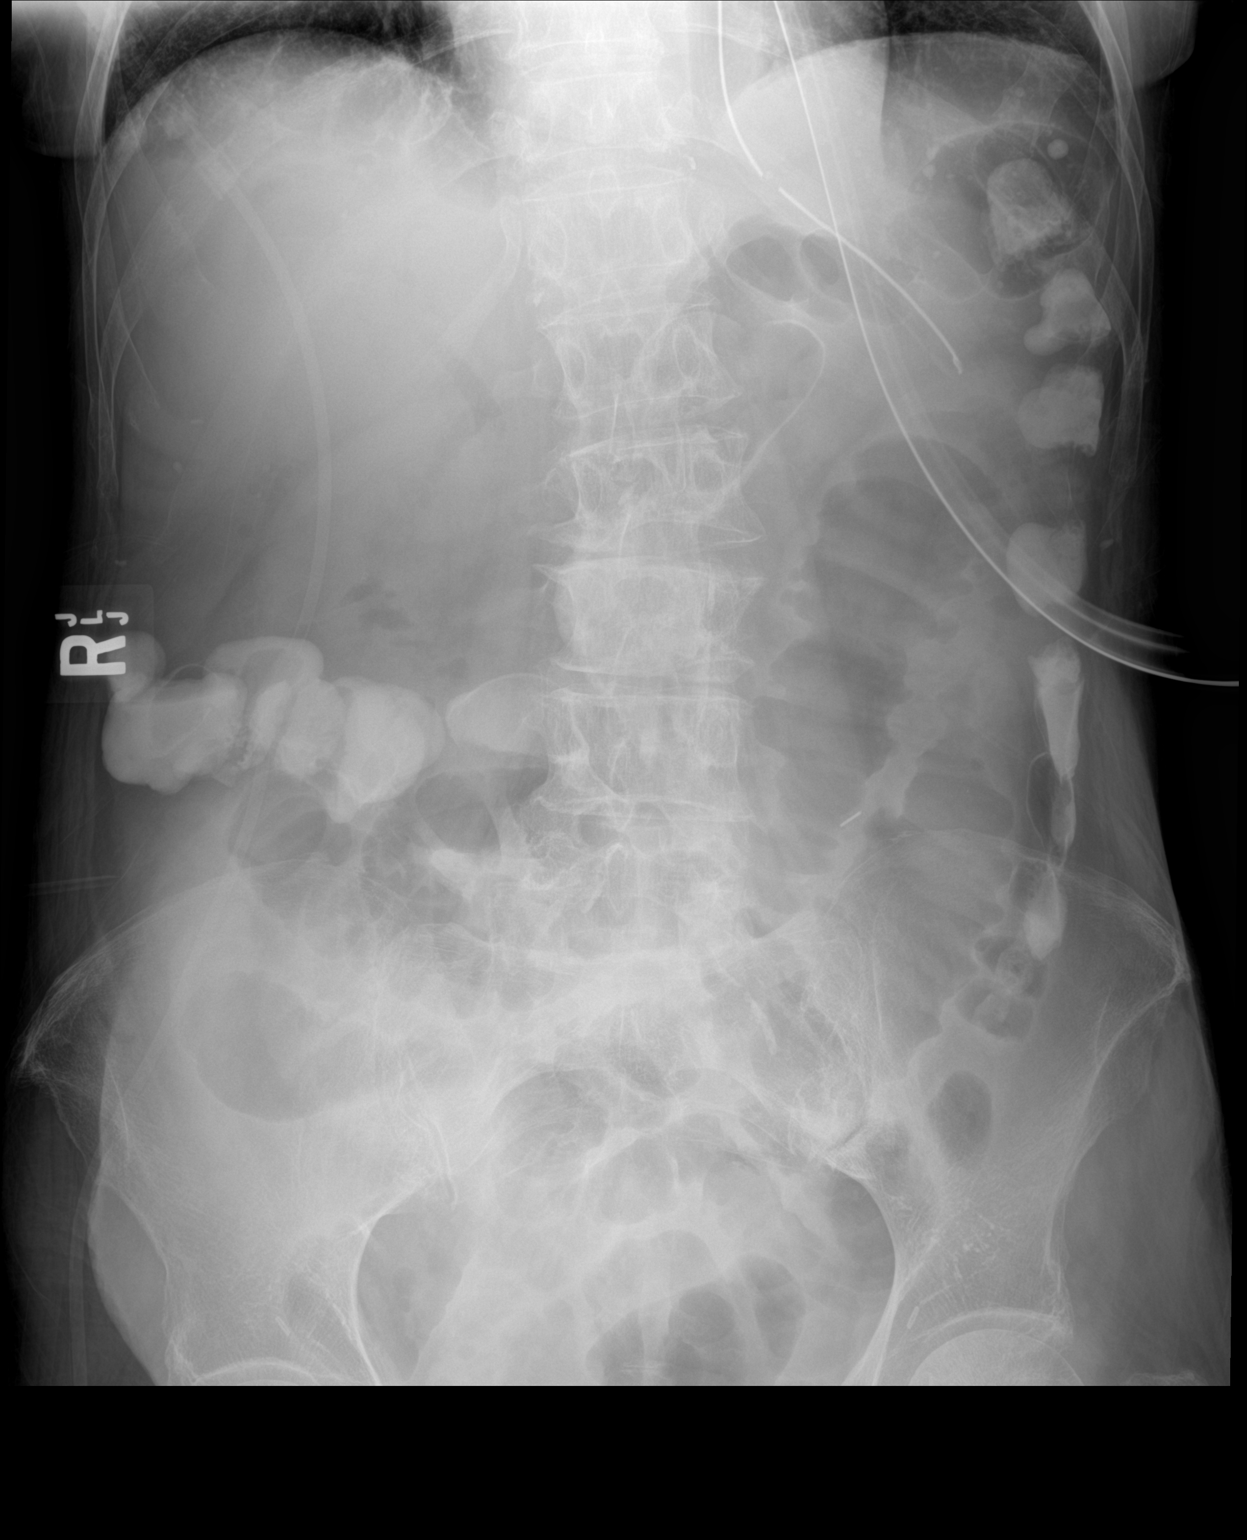

[1 of 1 positions shown; findings below may reference images not displayed]

FINDINGS: Mild decrease in caliber of multiple loops of dilated jejunum.
Interval mucosal thickening involving those dilated loops. Gas and
dilute oral contrast in normal caliber colon. Nasogastric tube tip
and side hole in the proximal stomach. Lumbar spine degenerative
changes and mild scoliosis.
IMPRESSION: Mild improvement in the pattern of partial small bowel obstruction
with interval evidence of mucosal thickening involving the dilated
loops of jejunum without pneumatosis. This could be due to
infectious, inflammatory or ischemic enteritis.

## 2021-09-27 ENCOUNTER — Ambulatory Visit (HOSPITAL_BASED_OUTPATIENT_CLINIC_OR_DEPARTMENT_OTHER): Payer: Medicare PPO

## 2021-10-02 ENCOUNTER — Ambulatory Visit (HOSPITAL_BASED_OUTPATIENT_CLINIC_OR_DEPARTMENT_OTHER)
Admission: RE | Admit: 2021-10-02 | Discharge: 2021-10-02 | Disposition: A | Discharge status: Home / Self Care | Payer: Medicare PPO | Source: Ambulatory Visit | Attending: Nurse Practitioner | Admitting: Nurse Practitioner

## 2021-10-02 ENCOUNTER — Other Ambulatory Visit: Payer: Self-pay

## 2021-10-02 DIAGNOSIS — J90 Pleural effusion, not elsewhere classified: Secondary | ICD-10-CM | POA: Diagnosis not present

## 2021-10-02 DIAGNOSIS — C787 Secondary malignant neoplasm of liver and intrahepatic bile duct: Secondary | ICD-10-CM | POA: Diagnosis not present

## 2021-10-02 DIAGNOSIS — R918 Other nonspecific abnormal finding of lung field: Secondary | ICD-10-CM | POA: Diagnosis not present

## 2021-10-02 DIAGNOSIS — C189 Malignant neoplasm of colon, unspecified: Secondary | ICD-10-CM | POA: Diagnosis not present

## 2021-10-02 DIAGNOSIS — J9811 Atelectasis: Secondary | ICD-10-CM | POA: Diagnosis not present

## 2021-10-02 DIAGNOSIS — J439 Emphysema, unspecified: Secondary | ICD-10-CM | POA: Diagnosis not present

## 2021-10-02 DIAGNOSIS — C269 Malignant neoplasm of ill-defined sites within the digestive system: Secondary | ICD-10-CM | POA: Diagnosis not present

## 2021-10-02 DIAGNOSIS — N83202 Unspecified ovarian cyst, left side: Secondary | ICD-10-CM | POA: Diagnosis not present

## 2021-10-02 MED ORDER — IOHEXOL 300 MG/ML  SOLN
80.0000 mL | Freq: Once | INTRAMUSCULAR | Status: AC | PRN
  Administered 2021-10-02: 80 mL via INTRAVENOUS

## 2021-10-02 MED ORDER — HEPARIN SOD (PORK) LOCK FLUSH 100 UNIT/ML IV SOLN
500.0000 [IU] | Freq: Once | INTRAVENOUS | Status: AC
  Administered 2021-10-02: 500 [IU] via INTRAVENOUS

## 2021-10-03 ENCOUNTER — Inpatient Hospital Stay: Payer: Medicare PPO | Admitting: Nurse Practitioner

## 2021-10-04 DIAGNOSIS — C189 Malignant neoplasm of colon, unspecified: Secondary | ICD-10-CM | POA: Diagnosis not present

## 2021-10-04 DIAGNOSIS — K56609 Unspecified intestinal obstruction, unspecified as to partial versus complete obstruction: Secondary | ICD-10-CM | POA: Diagnosis not present

## 2021-10-04 DIAGNOSIS — R112 Nausea with vomiting, unspecified: Secondary | ICD-10-CM | POA: Diagnosis not present

## 2021-10-04 DIAGNOSIS — R109 Unspecified abdominal pain: Secondary | ICD-10-CM | POA: Diagnosis not present

## 2021-10-05 ENCOUNTER — Other Ambulatory Visit: Payer: Self-pay

## 2021-10-05 ENCOUNTER — Encounter: Payer: Self-pay | Admitting: Nurse Practitioner

## 2021-10-05 ENCOUNTER — Inpatient Hospital Stay: Payer: Medicare PPO | Attending: Nurse Practitioner | Admitting: Nurse Practitioner

## 2021-10-05 VITALS — BP 171/80 | HR 86 | Temp 97.8°F | Resp 18 | Ht 66.0 in | Wt 111.0 lb

## 2021-10-05 DIAGNOSIS — Z79899 Other long term (current) drug therapy: Secondary | ICD-10-CM | POA: Diagnosis not present

## 2021-10-05 DIAGNOSIS — C189 Malignant neoplasm of colon, unspecified: Secondary | ICD-10-CM | POA: Diagnosis not present

## 2021-10-05 DIAGNOSIS — Z923 Personal history of irradiation: Secondary | ICD-10-CM | POA: Diagnosis not present

## 2021-10-05 DIAGNOSIS — C787 Secondary malignant neoplasm of liver and intrahepatic bile duct: Secondary | ICD-10-CM

## 2021-10-05 DIAGNOSIS — C7951 Secondary malignant neoplasm of bone: Secondary | ICD-10-CM | POA: Diagnosis not present

## 2021-10-05 DIAGNOSIS — Z5112 Encounter for antineoplastic immunotherapy: Secondary | ICD-10-CM | POA: Diagnosis not present

## 2021-10-05 MED ORDER — MINOCYCLINE HCL 100 MG PO CAPS: 100.0000 mg | ORAL_CAPSULE | Freq: Two times a day (BID) | ORAL | 2 refills | Status: DC

## 2021-10-05 MED ORDER — ENCORAFENIB 75 MG PO CAPS
300.0000 mg | ORAL_CAPSULE | Freq: Every day | ORAL | 0 refills | Status: DC
  Filled 2021-10-05 – 2021-10-09 (×2): qty 120, 30d supply, fill #0

## 2021-10-05 NOTE — Progress Notes (Signed)
[facility] OFFICE PROGRESS NOTE   Diagnosis: Colon cancer  INTERVAL HISTORY:   Yolanda Watson returns as scheduled.  She denies nausea/vomiting.  No diarrhea.  Good appetite.  Objective:  Vital signs in last 24 hours:  Blood pressure (!) 171/80, pulse 86, temperature 97.8 F (36.6 C), temperature source Oral, resp. rate 18, height _0  (1.676 m), weight 111 lb (50.3 kg), SpO2 100 %.    HEENT: No thrush or ulcers. Lymphatics: Firm mass left medial supraclavicular region.  Firm mass left inguinal region. Resp: Lungs clear bilaterally. Cardio: Regular rate and rhythm. GI: Liver palpable right abdomen.  No associated tenderness. Vascular: No leg edema. Port-A-Cath without erythema.  Lab Results:  Lab Results  Component Value Date   WBC 8.2 09/19/2021   HGB 13.4 09/19/2021   HCT 41.3 09/19/2021   MCV 99.8 09/19/2021   PLT 243 09/19/2021   NEUTROABS 4.1 09/19/2021    Imaging:  No results found.  Medications: I have reviewed the patient's current medications.  Assessment/Plan: Metastatic colon cancer presenting with an Abdominal/pelvic mass      Colon cancer diagnosed in [place] in 2016-pathology report dated 02/15/2015-invasive adenocarcinoma, well-differentiated with prominent mucinous features; microscopic tumor extension into mesenteric adipose tissue and focally involving visceral peritoneum; radial and mucosal margins negative; lymph-vascular invasion with suspicious foci identified;perineural invasion not identified; 16 negative lymph nodes; tumor deposits not identified; pT4apN0; MSI stable,BRAF V600E, ATM, MSS, TMB indeterminant-8, RAS wild-type   CT 04/22/2017 revealed a heterogenous anterior pelvic wall mass, right inguinal mass, top normal sized retroperitoneal and iliac nodes, abutment of the anterior dome of the bladder and a loop of small bowel 04/29/2017 biopsy of anterior abdominal/pelvic wall mass-acellular mucin dissecting fibrous  stroma Cycle 1 FOLFOX 05/15/2017 Cycle 2 FOLFOX 05/28/2017 Cycle 3 FOLFOX with Avastin on 06/11/2017 Cycle 4 FOLFOX/Avastin 06/25/2017 Cycle 5 FOLFOX/Avastin 07/10/2017, Neulasta added Restaging CT 07/21/2017-mild decrease in the size of a right inguinal node and the anterior abdominal wall mass Radiation to the abdominal wall mass 08/18/2017-09/03/2017 Status post excision of abdominal wall mass 10/03/2017--pathology showed adenocarcinoma with excessive extracellular mucin. Margins negative for tumor. Right inguinal wall mass also excised showing metastatic adenocarcinoma with excessive extracellular mucin in 1 of 8 lymph nodes.  CT abdomen/pelvis 06/16/2019- new right hepatic lobe lesion measuring 2.7 x 1.7 cm. Hypoattenuation within the pancreatic uncinate process measuring 1.5 x 1.6 cm.  Mild left adrenal thickening.  Upper pole left renal mass 3.0 x 3.6 cm.  Infiltrative hypoenhancement involving the lower pole left kidney.  Nonocclusive thrombus within the infrarenal IVC.  Necrotic adenopathy within the small bowel mesentery, new.  New necrotic left inguinal nodes.  Lytic lesion within and adjacent the anterior side of the L3 vertebral body.  Contiguous mass within the left psoas measuring 4.6 x 3.4 cm. Cycle 1 FOLFIRI 06/23/2019 Cycle 2 FOLFIRI 07/08/2019, Udenyca added  Cycle 3 FOLFIRI 07/22/2019 Cycle 4 FOLFIRI 08/04/2019 Cycle 5 FOLFIRI 08/18/2019 CTs 09/08/2019- 3.4 x 2.9 cm metastasis in segment 7 liver, previously 2.7 x 1.7 cm.  14 mm lesion/implant along the inferior right hepatic lobe, possibly new or possibly related to the prior peritoneal implant along the right paracolic gutter.  4 x 6 mm subpleural nodule right lower lobe not clearly evident on the prior study.  Near complete resolution of prior heterogeneous enhancement involving the bilateral kidneys which likely reflected pyelonephritis.  Near complete resolution of prior lymphadenopathy along with small bowel mesentery.  Mild left  inguinal lymphadenopathy measuring up to 14  mm, previously 15 mm.  Lytic metastasis at L3 with associated soft tissue metastasis involving the left psoas muscle improved. Cycle 6 FOLFIRI 09/16/2019, Udenyca Cycle 7 FOLFIRI 09/30/2019 Treatment break Cycle 8 FOLFIRI 01/06/2020 Cycle 9 FOLFIRI 01/26/2020 Cycle 10 FOLFIRI 02/24/2020 Cycle 11 FOLFIRI 03/16/2020 CT abdomen/pelvis 04/11/2020-posterior right liver lesion slightly larger, a second lesion in the inferior hepatic lobe is no longer present, portacaval nodes have decreased in size, slight decrease in size of left inguinal node, decreased L3 paraspinal mass CTs 09/25/2020-interval enlargement of a right liver mass, new 5 mm soft tissue nodule at the midline ventral peritoneum, no change in lytic L3 lesion, innumerable tiny lung nodules-likely inflammatory Cycle 1 FOLFIRI 10/05/2020 Cycle 2 FOLFIRI 10/19/2020 CTs 02/15/2021-enlargement of dominant right hepatic metastasis, enlargement of left inguinal node, stable density at the right paracolic gutter, resolution of omental nodule in the midline, new splenic infarct Cycle 1 FOLFIRI 02/21/2021 Cycle 2 FOLFIRI 03/15/2021 Cycle 3 FOLFIRI 05/31/2021 Cycle 4 FOLFIRI 06/21/2021, Udenyca Cycle 5 FOLFIRI 07/19/2021 CTs 10/02/2021-new lymph nodes in the chest and posterior mediastinum and in the left supraclavicular region as well as new and enlarging retroperitoneal and celiac lymph nodes.  Enlarging hepatic metastatic lesion occupies nearly the entire right hepatic lobe.  Chronic splenic infarct.  New small right and trace left pleural effusions.  Small volume ascites. Plan for encorafenib with Panitumumab every 2 weeks     3.  Ongoing tobacco and alcohol use   4.  Mild neutropenia related to chemotherapy 07/10/2017. Neulasta added.   5.  Admission January 2019 with an abdominal wall abscess and pubis osteomyelitis, status post debridement and antibiotics    6.         Pain secondary to #1. 7.         CT  06/16/2019 with nonocclusive thrombus infrarenal IVC.  Anticoagulation initiated. 8.  Left patella fracture following a fall 08/20/2019 9.  Admission 04/11/2020 with a small bowel obstruction, exploratory laparotomy with repair of multiple serosal tears and small bowel resection 04/19/2020, closure of abdomen with application of wound VAC 04/21/2020, pathology from small bowel resection 04/19/2020-acute serositis    Disposition: Yolanda Watson has metastatic colon cancer.  Recent restaging CTs show evidence of disease progression.  Dr. Benay Spice reviewed the CT report/images with Yolanda Watson and Yolanda Watson at today's visit and recommends changing treatment to Encorafenib plus Panitumumab.  We reviewed potential toxicities including diarrhea, rash, skin dryness, paronychia, wasting of potassium and magnesium, allergic reaction.  She understands the rationale for minocycline.  The [facility] pharmacist will discuss potential side effects related to Encorafenib in more detail.  She agrees to proceed.  Prescription sent to [facility] for minocycline.  She will return for lab and Panitumumab 10/11/2021.  We will see Yolanda in follow-up prior to Panitumumab on 10/25/2021.  We are available to see Yolanda sooner if needed.  Patient seen with Dr. Benay Spice.  Ned Card ANP/GNP-BC   10/05/2021  10:02 AM This was a shared visit with Ned Card.  We reviewed the restaging results and images with Yolanda Watson and Yolanda Watson.  There is evidence of disease progression in the chest and abdomen.  She has been treated with FOLFOX, bevacizumab, and FOLFIRI.  The tumor has a BRAF mutation.  I recommend treatment with encoraafenib and panitumumab.  We reviewed potential toxicities associated with this regimen.  She agrees to proceed.  I was present for greater than 50% of today's visit.  I performed medical decision making.  Yolanda Manson,  MD

## 2021-10-06 ENCOUNTER — Other Ambulatory Visit (HOSPITAL_COMMUNITY): Payer: Self-pay

## 2021-10-08 ENCOUNTER — Telehealth: Payer: Self-pay | Admitting: Pharmacist

## 2021-10-08 ENCOUNTER — Other Ambulatory Visit (HOSPITAL_COMMUNITY): Payer: Self-pay

## 2021-10-08 ENCOUNTER — Telehealth: Payer: Self-pay | Admitting: Pharmacy Technician

## 2021-10-08 NOTE — Telephone Encounter (Signed)
Oral Oncology Pharmacist Encounter  Received new prescription for encorafenib for the treatment of metastatic colon cancer in conjunction with panitumumab, planned duration until progression or unacceptable toxicity occurs.  Labs from 09/19/2021 assessed, no abnormalities to be addressed. Prescription dose and frequency assessed.   Current medication list in Epic reviewed, no DDIs with encorafenib identified.  Evaluated chart and some patient barriers to medication adherence identified: Patient does not currently have prescription insurance.   Prescription has been e-scribed to the [facility] for benefits analysis and approval.  Oral Oncology Clinic will continue to follow for insurance authorization, copayment issues, initial counseling and start date.  Patient agreed to treatment on 10/05/2021 per MD documentation.  Zenaida Deed, PharmD PGY1 Acute Care Pharmacy Resident  10/08/2021  11:49 AM

## 2021-10-08 NOTE — Telephone Encounter (Signed)
Oral Oncology Patient Advocate Encounter  Spoke to patient and her husband over the phone to discuss completing an application for [place] patient assistance in an effort to reduce patient's out of pocket expense for Braftovi to $0.    Application completed and faxed to [phone] on 10/12/21.   [employer] patient assistance phone number for follow up is [phone].   This encounter will be updated until final determination.   [facility] Patient [facility] Phone [phone] Fax [phone] 10/15/2021 11:09 AM

## 2021-10-09 ENCOUNTER — Other Ambulatory Visit (HOSPITAL_COMMUNITY): Payer: Self-pay

## 2021-10-09 ENCOUNTER — Encounter: Payer: Self-pay | Admitting: Oncology

## 2021-10-09 NOTE — Telephone Encounter (Signed)
Oral Chemotherapy Pharmacist Encounter  Patient Education I spoke with patient and her husband for overview of new oral chemotherapy medication: Braftovi (encorafenib) for the treatment of metastatic colon cancer in conjunction with panitumumab, planned duration until progression or unacceptable toxicity occurs.  Counseled patient on administration, dosing, side effects, monitoring, drug-food interactions, safe handling, storage, and disposal. Patient will take Braftovi (encorafenib) 300 mg (4 capsules) daily.  Side effects include but not limited to:  - Nausea/vomiting - patient reports that she hasn't had to use the prochlorperazine she received for chemotherapy, but she understands that she can let us know about any nausea and vomiting so we can make sure she has the appropriate medications - Acne-like rash - patient reports that her minocycline prescription needs to be picked up from the pharmacy. She reports that she will begin taking it when she starts the encorafenib.  - Diarrhea or constipation - patient understands that she can take over-the-counter medications (Imodium vs Colace or Senakot) if either of these occur  Reviewed with patient importance of keeping a medication schedule and plan for any missed doses.  After discussion with patient no patient barriers to medication adherence identified.   The [facility]'s voiced understanding and appreciation. All questions answered. Medication handout provided.  Provided patient with [facility] phone number. Patient knows to call the office with questions or concerns. Oral Chemotherapy Navigation Clinic will continue to follow.  Zenaida Deed, PharmD PGY1 Acute Care Pharmacy Resident  10/09/2021  3:08 PM

## 2021-10-09 NOTE — Telephone Encounter (Signed)
Oral Chemotherapy Pharmacist Encounter   Ms. Jacobsen plans on picking up her Braftovi from [facility] tomorrow afternoon, 10/12. Patient does not have prescription insurance and will receive her first medication using a one time 30-day supply voucher. Bethena Roys is working with the patient/son to enroll in manufacturer assistance.   Darl Pikes, PharmD, BCPS, BCOP, CPP Hematology/Oncology Clinical Pharmacist [facility]/HP/AP [facility] [phone]  10/09/2021 4:07 PM

## 2021-10-10 ENCOUNTER — Other Ambulatory Visit (HOSPITAL_COMMUNITY): Payer: Self-pay

## 2021-10-11 ENCOUNTER — Inpatient Hospital Stay: Payer: Medicare PPO

## 2021-10-11 ENCOUNTER — Other Ambulatory Visit: Payer: Self-pay

## 2021-10-11 VITALS — BP 153/75 | HR 86 | Temp 98.6°F | Resp 18

## 2021-10-11 DIAGNOSIS — Z79899 Other long term (current) drug therapy: Secondary | ICD-10-CM | POA: Diagnosis not present

## 2021-10-11 DIAGNOSIS — C189 Malignant neoplasm of colon, unspecified: Secondary | ICD-10-CM | POA: Diagnosis not present

## 2021-10-11 DIAGNOSIS — Z5112 Encounter for antineoplastic immunotherapy: Secondary | ICD-10-CM | POA: Diagnosis not present

## 2021-10-11 DIAGNOSIS — C787 Secondary malignant neoplasm of liver and intrahepatic bile duct: Secondary | ICD-10-CM

## 2021-10-11 DIAGNOSIS — C7951 Secondary malignant neoplasm of bone: Secondary | ICD-10-CM | POA: Diagnosis not present

## 2021-10-11 DIAGNOSIS — Z923 Personal history of irradiation: Secondary | ICD-10-CM | POA: Diagnosis not present

## 2021-10-11 LAB — CBC WITH DIFFERENTIAL (CANCER CENTER ONLY)
Abs Immature Granulocytes: 0.02 10*3/uL (ref 0.00–0.07)
Basophils Absolute: 0.1 10*3/uL (ref 0.0–0.1)
Basophils Relative: 1 %
Eosinophils Absolute: 2.4 10*3/uL — ABNORMAL HIGH (ref 0.0–0.5)
Eosinophils Relative: 27 %
HCT: 41.3 % (ref 36.0–46.0)
Hemoglobin: 13.5 g/dL (ref 12.0–15.0)
Immature Granulocytes: 0 %
Lymphocytes Relative: 15 %
Lymphs Abs: 1.3 10*3/uL (ref 0.7–4.0)
MCH: 31.8 pg (ref 26.0–34.0)
MCHC: 32.7 g/dL (ref 30.0–36.0)
MCV: 97.2 fL (ref 80.0–100.0)
Monocytes Absolute: 0.5 10*3/uL (ref 0.1–1.0)
Monocytes Relative: 6 %
Neutro Abs: 4.5 10*3/uL (ref 1.7–7.7)
Neutrophils Relative %: 51 %
Platelet Count: 266 10*3/uL (ref 150–400)
RBC: 4.25 MIL/uL (ref 3.87–5.11)
RDW: 12.8 % (ref 11.5–15.5)
WBC Count: 8.8 10*3/uL (ref 4.0–10.5)
nRBC: 0 % (ref 0.0–0.2)

## 2021-10-11 LAB — CMP (CANCER CENTER ONLY)
ALT: 28 U/L (ref 0–44)
AST: 37 U/L (ref 15–41)
Albumin: 4 g/dL (ref 3.5–5.0)
Alkaline Phosphatase: 234 U/L — ABNORMAL HIGH (ref 38–126)
Anion gap: 10 (ref 5–15)
BUN: 25 mg/dL — ABNORMAL HIGH (ref 8–23)
CO2: 23 mmol/L (ref 22–32)
Calcium: 9.1 mg/dL (ref 8.9–10.3)
Chloride: 104 mmol/L (ref 98–111)
Creatinine: 0.85 mg/dL (ref 0.44–1.00)
GFR, Estimated: >60 mL/min (ref >60)
Glucose, Bld: 128 mg/dL — ABNORMAL HIGH (ref 70–99)
Potassium: 3.9 mmol/L (ref 3.5–5.1)
Sodium: 137 mmol/L (ref 135–145)
Total Bilirubin: 0.5 mg/dL (ref 0.3–1.2)
Total Protein: 7.4 g/dL (ref 6.5–8.1)

## 2021-10-11 LAB — MAGNESIUM: Magnesium: 1.6 mg/dL — ABNORMAL LOW (ref 1.7–2.4)

## 2021-10-11 LAB — CEA (ACCESS): CEA (CHCC): 2243.32 ng/mL — ABNORMAL HIGH (ref 0.00–5.00)

## 2021-10-11 LAB — CEA (IN HOUSE-CHCC): CEA (CHCC-In House): 1042.94 ng/mL — ABNORMAL HIGH (ref 0.00–5.00)

## 2021-10-11 MED ORDER — HEPARIN SOD (PORK) LOCK FLUSH 100 UNIT/ML IV SOLN
500.0000 [IU] | Freq: Once | INTRAVENOUS | Status: AC | PRN
  Administered 2021-10-11: 500 [IU]

## 2021-10-11 MED ORDER — SODIUM CHLORIDE 0.9 % IV SOLN
6.0000 mg/kg | Freq: Once | INTRAVENOUS | Status: AC
  Administered 2021-10-11: 300 mg via INTRAVENOUS
  Filled 2021-10-11: qty 15

## 2021-10-11 MED ORDER — SODIUM CHLORIDE 0.9 % IV SOLN: Freq: Once | INTRAVENOUS | Status: AC

## 2021-10-11 MED ORDER — SODIUM CHLORIDE 0.9% FLUSH
10.0000 mL | INTRAVENOUS | Status: DC | PRN
  Administered 2021-10-11: 10 mL

## 2021-10-11 NOTE — Progress Notes (Signed)
Patient presents for treatment. RN assessment completed along with the following:  Labs/vitals reviewed - Yes, and within treatment parameters.  MG 1.6 ok to treat Weight within 10% of previous measurement - Yes Oncology Treatment Attestation completed for current therapy- Yes, on date 10/05/21 Informed consent completed and reflects current therapy/intent - Yes, on date 10/11/21             Provider progress note reviewed - Patient not seen by provider today. Most recent note dated 10/05/21 reviewed. Treatment/Antibody/Supportive plan reviewed - Yes, and there are no adjustments needed for today's treatment. S&H and other orders reviewed - Yes, and there are no additional orders identified. Previous treatment date reviewed - Yes, and the appropriate amount of time has elapsed between treatments. Clinic Hand Off Received from - no   Patient to proceed with treatment.

## 2021-10-11 NOTE — Patient Instructions (Signed)
Yolanda Watson  Discharge Instructions: Thank you for choosing [facility] to provide your oncology and hematology care.   If you have a lab appointment with the [facility], please go directly to the [facility] and check in at the registration area.   Wear comfortable clothing and clothing appropriate for easy access to any Portacath or PICC line.   We strive to give you quality time with your provider. You may need to reschedule your appointment if you arrive late (15 or more minutes).  Arriving late affects you and other patients whose appointments are after yours.  Also, if you miss three or more appointments without notifying the office, you may be dismissed from the clinic at the provider's discretion.      For prescription refill requests, have your pharmacy contact our office and allow 72 hours for refills to be completed.    Today you received the following chemotherapy and/or immunotherapy agents Vectibix      To help prevent nausea and vomiting after your treatment, we encourage you to take your nausea medication as directed.  BELOW ARE SYMPTOMS THAT SHOULD BE REPORTED IMMEDIATELY: *FEVER GREATER THAN 100.4 F (38 C) OR HIGHER *CHILLS OR SWEATING *NAUSEA AND VOMITING THAT IS NOT CONTROLLED WITH YOUR NAUSEA MEDICATION *UNUSUAL SHORTNESS OF BREATH *UNUSUAL BRUISING OR BLEEDING *URINARY PROBLEMS (pain or burning when urinating, or frequent urination) *BOWEL PROBLEMS (unusual diarrhea, constipation, pain near the anus) TENDERNESS IN MOUTH AND THROAT WITH OR WITHOUT PRESENCE OF ULCERS (sore throat, sores in mouth, or a toothache) UNUSUAL RASH, SWELLING OR PAIN  UNUSUAL VAGINAL DISCHARGE OR ITCHING   Items with * indicate a potential emergency and should be followed up as soon as possible or go to the Emergency Department if any problems should occur.  Please show the CHEMOTHERAPY ALERT CARD or IMMUNOTHERAPY ALERT CARD at check-in to the  Emergency Department and triage nurse.  Should you have questions after your visit or need to cancel or reschedule your appointment, please contact [facility]  Dept: [phone]  and follow the prompts.  Office hours are 8:00 a.m. to 4:30 p.m. Monday - Friday. Please note that voicemails left after 4:00 p.m. may not be returned until the following business day.  We are closed weekends and major holidays. You have access to a nurse at all times for urgent questions. Please call the main number to the clinic Dept: [phone] and follow the prompts.   For any non-urgent questions, you may also contact your provider using MyChart. We now offer e-Visits for anyone 22 and older to request care online for non-urgent symptoms. For details visit [email or web address].   Also download the MyChart app! Go to the app store, search "MyChart", open the app, select [facility], and log in with your MyChart username and password.  Due to Covid, a mask is required upon entering the hospital/clinic. If you do not have a mask, one will be given to you upon arrival. For doctor visits, patients may have 1 support person aged 49 or older with them. For treatment visits, patients cannot have anyone with them due to current Covid guidelines and our immunocompromised population.   Panitumumab Solution for Injection What is this medication? PANITUMUMAB (pan i TOOM ue mab) is a monoclonal antibody. It is used to treat colorectal cancer. This medicine may be used for other purposes; ask your health care provider or pharmacist if you have questions. COMMON BRAND NAME(S): Vectibix What  should I tell my care team before I take this medication? They need to know if you have any of these conditions: eye disease, vision problems low levels of calcium, magnesium, or potassium in the blood lung or breathing disease, like asthma skin conditions or sensitivity an unusual or allergic reaction to  panitumumab, other medicines, foods, dyes, or preservatives pregnant or trying to get pregnant breast-feeding How should I use this medication? This drug is given as an infusion into a vein. It is administered in a hospital or clinic by a specially trained health care professional. Talk to your pediatrician regarding the use of this medicine in children. Special care may be needed. Overdosage: If you think you have taken too much of this medicine contact a poison control center or emergency room at once. NOTE: This medicine is only for you. Do not share this medicine with others. What if I miss a dose? It is important not to miss your dose. Call your doctor or health care professional if you are unable to keep an appointment. What may interact with this medication? Do not take this medicine with any of the following medications: bevacizumab This list may not describe all possible interactions. Give your health care provider a list of all the medicines, herbs, non-prescription drugs, or dietary supplements you use. Also tell them if you smoke, drink alcohol, or use illegal drugs. Some items may interact with your medicine. What should I watch for while using this medication? Visit your doctor for checks on your progress. This drug may make you feel generally unwell. This is not uncommon, as chemotherapy can affect healthy cells as well as cancer cells. Report any side effects. Continue your course of treatment even though you feel ill unless your doctor tells you to stop. This medicine can make you more sensitive to the sun. Keep out of the sun while receiving this medicine and for 2 months after the last dose. If you cannot avoid being in the sun, wear protective clothing and use sunscreen. Do not use sun lamps or tanning beds/booths. In some cases, you may be given additional medicines to help with side effects. Follow all directions for their use. Call your doctor or health care professional for  advice if you get a fever, chills or sore throat, or other symptoms of a cold or flu. Do not treat yourself. This drug decreases your body's ability to fight infections. Try to avoid being around people who are sick. Avoid taking products that contain aspirin, acetaminophen, ibuprofen, naproxen, or ketoprofen unless instructed by your doctor. These medicines may hide a fever. Do not become pregnant while taking this medicine and for 2 months after the last dose. Women should inform their doctor if they wish to become pregnant or think they might be pregnant. There is a potential for serious side effects to an unborn child. Talk to your health care professional or pharmacist for more information. Do not breast-feed an infant while taking this medicine or for 2 months after the last dose. What side effects may I notice from receiving this medication? Side effects that you should report to your doctor or health care professional as soon as possible: allergic reactions like skin rash, itching or hives, swelling of the face, lips, or tongue breathing problems changes in vision eye pain fast, irregular heartbeat fever, chills mouth sores red spots on the skin redness, blistering, peeling or loosening of the skin, including inside the mouth signs and symptoms of kidney injury like trouble passing  urine or change in the amount of urine signs and symptoms of low blood pressure like dizziness; feeling faint or lightheaded, falls; unusually weak or tired signs of low calcium like fast heartbeat, muscle cramps or muscle pain; pain, tingling, numbness in the hands or feet; seizures signs and symptoms of low magnesium like muscle cramps, pain, or weakness; tremors; seizures; or fast, irregular heartbeat signs and symptoms of low potassium like muscle cramps or muscle pain; chest pain; dizziness; feeling faint or lightheaded, falls; palpitations; breathing problems; or fast, irregular heartbeat swelling of the  ankles, feet, hands Side effects that usually do not require medical attention (report to your doctor or health care professional if they continue or are bothersome): changes in skin like acne, cracks, skin dryness diarrhea eyelash growth headache mouth sores nail changes nausea, vomiting This list may not describe all possible side effects. Call your doctor for medical advice about side effects. You may report side effects to FDA at 1-800-FDA-1088. Where should I keep my medication? This drug is given in a hospital or clinic and will not be stored at home. NOTE: This sheet is a summary. It may not cover all possible information. If you have questions about this medicine, talk to your doctor, pharmacist, or health care provider.  2022 Elsevier/Gold Standard (2016-07-05 16:45:04)

## 2021-10-12 ENCOUNTER — Encounter: Payer: Self-pay | Admitting: *Deleted

## 2021-10-16 ENCOUNTER — Other Ambulatory Visit: Payer: Self-pay | Admitting: Oncology

## 2021-10-16 DIAGNOSIS — C189 Malignant neoplasm of colon, unspecified: Secondary | ICD-10-CM

## 2021-10-17 ENCOUNTER — Other Ambulatory Visit: Payer: Self-pay | Admitting: Oncology

## 2021-10-17 ENCOUNTER — Other Ambulatory Visit (HOSPITAL_COMMUNITY): Payer: Self-pay

## 2021-10-17 DIAGNOSIS — C189 Malignant neoplasm of colon, unspecified: Secondary | ICD-10-CM

## 2021-10-17 NOTE — Telephone Encounter (Signed)
Oral Oncology Patient Advocate Encounter  Received notification from [place] Patient Assistance program that patient has been successfully enrolled into their program to receive Braftovi from the manufacturer at $0 out of pocket until 12/29/21.    I will call the patient to let her know of the approval and that we will need to re-apply for assistance in Nov-Dec.   Specialty Pharmacy that will dispense medication is [facility].  Patient knows to call the office with questions or concerns.   Oral Oncology Clinic will continue to follow.  [facility] Patient [facility] Phone [phone] Fax [phone] 10/17/2021 4:22 PM

## 2021-10-18 ENCOUNTER — Other Ambulatory Visit (HOSPITAL_COMMUNITY): Payer: Self-pay

## 2021-10-18 ENCOUNTER — Other Ambulatory Visit: Payer: Self-pay | Admitting: Oncology

## 2021-10-18 ENCOUNTER — Other Ambulatory Visit: Payer: Self-pay | Admitting: *Deleted

## 2021-10-18 ENCOUNTER — Encounter: Payer: Self-pay | Admitting: Oncology

## 2021-10-18 DIAGNOSIS — C189 Malignant neoplasm of colon, unspecified: Secondary | ICD-10-CM

## 2021-10-18 MED ORDER — RIVAROXABAN 20 MG PO TABS
ORAL_TABLET | ORAL | 2 refills | Status: AC
Start: 2021-10-18 — End: 2022-10-18
  Filled 2021-10-18: qty 30, 30d supply, fill #0
  Filled 2021-11-14: qty 30, 30d supply, fill #1

## 2021-10-23 ENCOUNTER — Other Ambulatory Visit (HOSPITAL_COMMUNITY): Payer: Self-pay

## 2021-10-23 ENCOUNTER — Inpatient Hospital Stay: Payer: Medicare PPO

## 2021-10-23 ENCOUNTER — Ambulatory Visit: Payer: Medicare PPO

## 2021-10-23 ENCOUNTER — Inpatient Hospital Stay: Payer: Medicare PPO | Admitting: Oncology

## 2021-10-24 ENCOUNTER — Other Ambulatory Visit (HOSPITAL_COMMUNITY): Payer: Self-pay

## 2021-10-27 ENCOUNTER — Other Ambulatory Visit: Payer: Self-pay | Admitting: Oncology

## 2021-10-29 ENCOUNTER — Telehealth: Payer: Self-pay | Admitting: *Deleted

## 2021-10-29 NOTE — Telephone Encounter (Signed)
Called to inquire how many encorafenib she is supposed to be taking daily. Has only been taking #1 75 mg capsule. Instructed her that daily dose is 300 mg, so she should be taking #4 capsules/day. She agrees to begin the correct dosing on 10/29.

## 2021-10-31 ENCOUNTER — Telehealth: Payer: Self-pay

## 2021-10-31 ENCOUNTER — Other Ambulatory Visit: Payer: Self-pay

## 2021-10-31 MED ORDER — PROCHLORPERAZINE MALEATE 10 MG PO TABS: 10.0000 mg | ORAL_TABLET | Freq: Four times a day (QID) | ORAL | 0 refills | Status: DC | PRN

## 2021-10-31 NOTE — Telephone Encounter (Addendum)
TC from Pt stating she is not feeling well Pt stated she thinks the Braftovi is making her sick. Pt stated she is nauseous and can't eat or drink. Asked Pt if she is taking nausea medication. Pt stated she doesn't have any nausea medication to take. Asked Pt would she want to come in for fluids Pt declined, and stated she will cancel appointment for tomorrow. Compazine prescription called in to pharmacy. Pt stated she will reschedule appointment when she feels better.

## 2021-10-31 NOTE — Telephone Encounter (Signed)
TC from Pt stating she was not feeling well. Pt states she is fatigued, nauseous,and wants to cancel appointment for tomorrow. Asked Pt if she is taking any nausea medicine. Pt stated she does not have any nausea medication. Informed Pt that she should come in for visit tomorrow so we can maybe give her some fluids. Pt refused stated she is not going to be able to make the appointment. Discussed with Dr Ila Mcgill medication called into pharmacy. Informed Pt per Dr Benay Spice stop medication take the nausea medication to see if that will help with nausea And if it helps will restart medication. Pt verbalized understanding stated she will reschedule appointment when she is feeling better.

## 2021-11-01 ENCOUNTER — Inpatient Hospital Stay: Payer: Medicare PPO

## 2021-11-01 ENCOUNTER — Encounter: Payer: Self-pay | Admitting: Oncology

## 2021-11-01 ENCOUNTER — Inpatient Hospital Stay: Payer: Medicare PPO | Admitting: Oncology

## 2021-11-02 ENCOUNTER — Telehealth: Payer: Self-pay

## 2021-11-02 NOTE — Telephone Encounter (Signed)
Call from pt stating she feels better and would like to ask provider to reduce Braftovi message placed for provider review.

## 2021-11-02 NOTE — Telephone Encounter (Signed)
Turn call received by pt made aware of per Dr. Benay Spice can reduce Braftovi to 2 pills daily at 150mg (daily)pt has f/u appt Tues Nov 8th at 1020am will call to make office aware of how she is doing?

## 2021-11-04 DIAGNOSIS — R112 Nausea with vomiting, unspecified: Secondary | ICD-10-CM | POA: Diagnosis not present

## 2021-11-04 DIAGNOSIS — C189 Malignant neoplasm of colon, unspecified: Secondary | ICD-10-CM | POA: Diagnosis not present

## 2021-11-04 DIAGNOSIS — K56609 Unspecified intestinal obstruction, unspecified as to partial versus complete obstruction: Secondary | ICD-10-CM | POA: Diagnosis not present

## 2021-11-04 DIAGNOSIS — R109 Unspecified abdominal pain: Secondary | ICD-10-CM | POA: Diagnosis not present

## 2021-11-05 ENCOUNTER — Other Ambulatory Visit: Payer: Self-pay | Admitting: Nurse Practitioner

## 2021-11-05 ENCOUNTER — Other Ambulatory Visit (HOSPITAL_COMMUNITY): Payer: Self-pay

## 2021-11-05 DIAGNOSIS — C189 Malignant neoplasm of colon, unspecified: Secondary | ICD-10-CM

## 2021-11-05 MED ORDER — OXYCODONE-ACETAMINOPHEN 5-325 MG PO TABS: 1.0000 | ORAL_TABLET | Freq: Four times a day (QID) | ORAL | 0 refills | Status: DC | PRN

## 2021-11-06 ENCOUNTER — Other Ambulatory Visit: Payer: Self-pay

## 2021-11-06 ENCOUNTER — Inpatient Hospital Stay: Payer: Medicare PPO | Attending: Nurse Practitioner | Admitting: Oncology

## 2021-11-06 VITALS — BP 102/75 | HR 87 | Temp 97.8°F | Resp 18 | Ht 66.0 in | Wt 106.0 lb

## 2021-11-06 DIAGNOSIS — Z9221 Personal history of antineoplastic chemotherapy: Secondary | ICD-10-CM | POA: Diagnosis not present

## 2021-11-06 DIAGNOSIS — Z5112 Encounter for antineoplastic immunotherapy: Secondary | ICD-10-CM | POA: Diagnosis not present

## 2021-11-06 DIAGNOSIS — Z923 Personal history of irradiation: Secondary | ICD-10-CM | POA: Insufficient documentation

## 2021-11-06 DIAGNOSIS — C787 Secondary malignant neoplasm of liver and intrahepatic bile duct: Secondary | ICD-10-CM | POA: Diagnosis not present

## 2021-11-06 DIAGNOSIS — C189 Malignant neoplasm of colon, unspecified: Secondary | ICD-10-CM | POA: Insufficient documentation

## 2021-11-06 DIAGNOSIS — Z79899 Other long term (current) drug therapy: Secondary | ICD-10-CM | POA: Diagnosis not present

## 2021-11-06 NOTE — Progress Notes (Signed)
[facility] OFFICE PROGRESS NOTE   Diagnosis: Colon cancer  INTERVAL HISTORY:   [name] returns as scheduled.  She started panitumumab and encorafenib on 10/11/2021.  No diarrhea.  She reports a mild rash.  She discontinued encorafenib secondary to nausea.  She is unable to tolerate the 300 mg dose, but has been tolerating the encorafenib at a dose of 150 mg daily.  She is no longer taking minocycline.  She does not believe minocycline caused nausea.  The left groin mass has not changed.  She is now taking oxycodone for pain.  Objective:  Vital signs in last 24 hours:  Blood pressure 102/75, pulse 87, temperature 97.8 F (36.6 C), temperature source Oral, resp. rate 18, height $RemoveBe'5\' 6"'nUbMyFFKU$  (1.676 m), weight 106 lb (48.1 kg), SpO2 100 %.    HEENT: Leukoplakia of the bilateral buccal mucosa, no ulcers Lymphatics: Firm 3-4 cm mass in the left groin Resp: Lungs clear bilaterally Cardio: Regular rate and rhythm GI: No hepatosplenomegaly, ventral hernia Vascular: No leg edema  Skin: Diffuse dryness of the skin, acne type rash over the back, no paronychia of the hands  Portacath/PICC-without erythema  Lab Results:  Lab Results  Component Value Date   WBC 8.8 10/11/2021   HGB 13.5 10/11/2021   HCT 41.3 10/11/2021   MCV 97.2 10/11/2021   PLT 266 10/11/2021   NEUTROABS 4.5 10/11/2021    CMP  Lab Results  Component Value Date   NA 137 10/11/2021   K 3.9 10/11/2021   CL 104 10/11/2021   CO2 23 10/11/2021   GLUCOSE 128 (H) 10/11/2021   BUN 25 (H) 10/11/2021   CREATININE 0.85 10/11/2021   CALCIUM 9.1 10/11/2021   PROT 7.4 10/11/2021   ALBUMIN 4.0 10/11/2021   AST 37 10/11/2021   ALT 28 10/11/2021   ALKPHOS 234 (H) 10/11/2021   BILITOT 0.5 10/11/2021   GFRNONAA >60 10/11/2021   GFRAA >60 08/24/2020    Lab Results  Component Value Date   CEA1 1,042.94 (H) 10/11/2021   CEA 2,243.32 (H) 10/11/2021      Medications: I have reviewed the patient's current  medications.   Assessment/Plan: Metastatic colon cancer presenting with an Abdominal/pelvic mass      Colon cancer diagnosed in [place] in 2016-pathology report dated 02/15/2015-invasive adenocarcinoma, well-differentiated with prominent mucinous features; microscopic tumor extension into mesenteric adipose tissue and focally involving visceral peritoneum; radial and mucosal margins negative; lymph-vascular invasion with suspicious foci identified;perineural invasion not identified; 16 negative lymph nodes; tumor deposits not identified; pT4apN0; MSI stable,BRAF V600E, ATM, MSS, TMB indeterminant-8, RAS wild-type   CT 04/22/2017 revealed a heterogenous anterior pelvic wall mass, right inguinal mass, top normal sized retroperitoneal and iliac nodes, abutment of the anterior dome of the bladder and a loop of small bowel 04/29/2017 biopsy of anterior abdominal/pelvic wall mass-acellular mucin dissecting fibrous stroma Cycle 1 FOLFOX 05/15/2017 Cycle 2 FOLFOX 05/28/2017 Cycle 3 FOLFOX with Avastin on 06/11/2017 Cycle 4 FOLFOX/Avastin 06/25/2017 Cycle 5 FOLFOX/Avastin 07/10/2017, Neulasta added Restaging CT 07/21/2017-mild decrease in the size of a right inguinal node and the anterior abdominal wall mass Radiation to the abdominal wall mass 08/18/2017-09/03/2017 Status post excision of abdominal wall mass 10/03/2017--pathology showed adenocarcinoma with excessive extracellular mucin. Margins negative for tumor. Right inguinal wall mass also excised showing metastatic adenocarcinoma with excessive extracellular mucin in 1 of 8 lymph nodes.  CT abdomen/pelvis 06/16/2019- new right hepatic lobe lesion measuring 2.7 x 1.7 cm. Hypoattenuation within the pancreatic uncinate process measuring 1.5 x  1.6 cm.  Mild left adrenal thickening.  Upper pole left renal mass 3.0 x 3.6 cm.  Infiltrative hypoenhancement involving the lower pole left kidney.  Nonocclusive thrombus within the infrarenal IVC.  Necrotic  adenopathy within the small bowel mesentery, new.  New necrotic left inguinal nodes.  Lytic lesion within and adjacent the anterior side of the L3 vertebral body.  Contiguous mass within the left psoas measuring 4.6 x 3.4 cm. Cycle 1 FOLFIRI 06/23/2019 Cycle 2 FOLFIRI 07/08/2019, Udenyca added  Cycle 3 FOLFIRI 07/22/2019 Cycle 4 FOLFIRI 08/04/2019 Cycle 5 FOLFIRI 08/18/2019 CTs 09/08/2019- 3.4 x 2.9 cm metastasis in segment 7 liver, previously 2.7 x 1.7 cm.  14 mm lesion/implant along the inferior right hepatic lobe, possibly new or possibly related to the prior peritoneal implant along the right paracolic gutter.  4 x 6 mm subpleural nodule right lower lobe not clearly evident on the prior study.  Near complete resolution of prior heterogeneous enhancement involving the bilateral kidneys which likely reflected pyelonephritis.  Near complete resolution of prior lymphadenopathy along with small bowel mesentery.  Mild left inguinal lymphadenopathy measuring up to 14 mm, previously 15 mm.  Lytic metastasis at L3 with associated soft tissue metastasis involving the left psoas muscle improved. Cycle 6 FOLFIRI 09/16/2019, Udenyca Cycle 7 FOLFIRI 09/30/2019 Treatment break Cycle 8 FOLFIRI 01/06/2020 Cycle 9 FOLFIRI 01/26/2020 Cycle 10 FOLFIRI 02/24/2020 Cycle 11 FOLFIRI 03/16/2020 CT abdomen/pelvis 04/11/2020-posterior right liver lesion slightly larger, a second lesion in the inferior hepatic lobe is no longer present, portacaval nodes have decreased in size, slight decrease in size of left inguinal node, decreased L3 paraspinal mass CTs 09/25/2020-interval enlargement of a right liver mass, new 5 mm soft tissue nodule at the midline ventral peritoneum, no change in lytic L3 lesion, innumerable tiny lung nodules-likely inflammatory Cycle 1 FOLFIRI 10/05/2020 Cycle 2 FOLFIRI 10/19/2020 CTs 02/15/2021-enlargement of dominant right hepatic metastasis, enlargement of left inguinal node, stable density at the right paracolic  gutter, resolution of omental nodule in the midline, new splenic infarct Cycle 1 FOLFIRI 02/21/2021 Cycle 2 FOLFIRI 03/15/2021 Cycle 3 FOLFIRI 05/31/2021 Cycle 4 FOLFIRI 06/21/2021, Udenyca Cycle 5 FOLFIRI 07/19/2021 CTs 10/02/2021-new lymph nodes in the chest and posterior mediastinum and in the left supraclavicular region as well as new and enlarging retroperitoneal and celiac lymph nodes.  Enlarging hepatic metastatic lesion occupies nearly the entire right hepatic lobe.  Chronic splenic infarct.  New small right and trace left pleural effusions.  Small volume ascites. Encorafenib with Panitumumab beginning 10/11/2021, Encorafenib taking at 150 mg daily secondary to nausea     3.  Ongoing tobacco and alcohol use   4.  Mild neutropenia related to chemotherapy 07/10/2017. Neulasta added.   5.  Admission January 2019 with an abdominal wall abscess and pubis osteomyelitis, status post debridement and antibiotics    6.         Pain secondary to #1. 7.         CT 06/16/2019 with nonocclusive thrombus infrarenal IVC.  Anticoagulation initiated. 8.  Left patella fracture following a fall 08/20/2019 9.  Admission 04/11/2020 with a small bowel obstruction, exploratory laparotomy with repair of multiple serosal tears and small bowel resection 04/19/2020, closure of abdomen with application of wound VAC 04/21/2020, pathology from small bowel resection 04/19/2020-acute serositis     Disposition: [name] appears unchanged.  She started Encorfenib and panitumumab last month.  She missed the scheduled second dose of panitumumab and is taking the encorafenib at a reduced dose.  I encouraged  her to continue the encorafenib and resume minocycline.  She will return for another dose of panitumumab tomorrow.  She will be scheduled for an office visit and panitumumab in 2 weeks.  She will continue oxycodone as needed for pain.  Betsy Coder, MD  11/06/2021  10:54 AM

## 2021-11-06 NOTE — Progress Notes (Deleted)
[facility] OFFICE PROGRESS NOTE   Diagnosis:   INTERVAL HISTORY:   ***  Objective:  Vital signs in last 24 hours:  Blood pressure 102/75, pulse 87, temperature 97.8 F (36.6 C), temperature source Oral, resp. rate 18, height _0  (1.676 m), weight 106 lb (48.1 kg), SpO2 100 %.    HEENT: *** Lymphatics: *** Resp: *** Cardio: *** GI: *** Vascular: *** Neuro:***  Skin:***   Portacath/PICC-without erythema  Lab Results:  Lab Results  Component Value Date   WBC 8.8 10/11/2021   HGB 13.5 10/11/2021   HCT 41.3 10/11/2021   MCV 97.2 10/11/2021   PLT 266 10/11/2021   NEUTROABS 4.5 10/11/2021    CMP  Lab Results  Component Value Date   NA 137 10/11/2021   K 3.9 10/11/2021   CL 104 10/11/2021   CO2 23 10/11/2021   GLUCOSE 128 (H) 10/11/2021   BUN 25 (H) 10/11/2021   CREATININE 0.85 10/11/2021   CALCIUM 9.1 10/11/2021   PROT 7.4 10/11/2021   ALBUMIN 4.0 10/11/2021   AST 37 10/11/2021   ALT 28 10/11/2021   ALKPHOS 234 (H) 10/11/2021   BILITOT 0.5 10/11/2021   GFRNONAA >60 10/11/2021   GFRAA >60 08/24/2020    Lab Results  Component Value Date   CEA1 1,042.94 (H) 10/11/2021   CEA 2,243.32 (H) 10/11/2021    Lab Results  Component Value Date   INR 0.9 04/12/2020   LABPROT 11.9 04/12/2020    Imaging:  No results found.  Medications: I have reviewed the patient's current medications.   Assessment/Plan: Metastatic colon cancer presenting with an Abdominal/pelvic mass      Colon cancer diagnosed in [place] in 2016-pathology report dated 02/15/2015-invasive adenocarcinoma, well-differentiated with prominent mucinous features; microscopic tumor extension into mesenteric adipose tissue and focally involving visceral peritoneum; radial and mucosal margins negative; lymph-vascular invasion with suspicious foci identified;perineural invasion not identified; 16 negative lymph nodes; tumor deposits not identified; pT4apN0; MSI stable,BRAF V600E,  ATM, MSS, TMB indeterminant-8, RAS wild-type   CT 04/22/2017 revealed a heterogenous anterior pelvic wall mass, right inguinal mass, top normal sized retroperitoneal and iliac nodes, abutment of the anterior dome of the bladder and a loop of small bowel 04/29/2017 biopsy of anterior abdominal/pelvic wall mass-acellular mucin dissecting fibrous stroma Cycle 1 FOLFOX 05/15/2017 Cycle 2 FOLFOX 05/28/2017 Cycle 3 FOLFOX with Avastin on 06/11/2017 Cycle 4 FOLFOX/Avastin 06/25/2017 Cycle 5 FOLFOX/Avastin 07/10/2017, Neulasta added Restaging CT 07/21/2017-mild decrease in the size of a right inguinal node and the anterior abdominal wall mass Radiation to the abdominal wall mass 08/18/2017-09/03/2017 Status post excision of abdominal wall mass 10/03/2017--pathology showed adenocarcinoma with excessive extracellular mucin. Margins negative for tumor. Right inguinal wall mass also excised showing metastatic adenocarcinoma with excessive extracellular mucin in 1 of 8 lymph nodes.  CT abdomen/pelvis 06/16/2019- new right hepatic lobe lesion measuring 2.7 x 1.7 cm. Hypoattenuation within the pancreatic uncinate process measuring 1.5 x 1.6 cm.  Mild left adrenal thickening.  Upper pole left renal mass 3.0 x 3.6 cm.  Infiltrative hypoenhancement involving the lower pole left kidney.  Nonocclusive thrombus within the infrarenal IVC.  Necrotic adenopathy within the small bowel mesentery, new.  New necrotic left inguinal nodes.  Lytic lesion within and adjacent the anterior side of the L3 vertebral body.  Contiguous mass within the left psoas measuring 4.6 x 3.4 cm. Cycle 1 FOLFIRI 06/23/2019 Cycle 2 FOLFIRI 07/08/2019, Udenyca added  Cycle 3 FOLFIRI 07/22/2019 Cycle 4 FOLFIRI 08/04/2019 Cycle 5 FOLFIRI 08/18/2019 CTs  09/08/2019- 3.4 x 2.9 cm metastasis in segment 7 liver, previously 2.7 x 1.7 cm.  14 mm lesion/implant along the inferior right hepatic lobe, possibly new or possibly related to the prior peritoneal implant  along the right paracolic gutter.  4 x 6 mm subpleural nodule right lower lobe not clearly evident on the prior study.  Near complete resolution of prior heterogeneous enhancement involving the bilateral kidneys which likely reflected pyelonephritis.  Near complete resolution of prior lymphadenopathy along with small bowel mesentery.  Mild left inguinal lymphadenopathy measuring up to 14 mm, previously 15 mm.  Lytic metastasis at L3 with associated soft tissue metastasis involving the left psoas muscle improved. Cycle 6 FOLFIRI 09/16/2019, Udenyca Cycle 7 FOLFIRI 09/30/2019 Treatment break Cycle 8 FOLFIRI 01/06/2020 Cycle 9 FOLFIRI 01/26/2020 Cycle 10 FOLFIRI 02/24/2020 Cycle 11 FOLFIRI 03/16/2020 CT abdomen/pelvis 04/11/2020-posterior right liver lesion slightly larger, a second lesion in the inferior hepatic lobe is no longer present, portacaval nodes have decreased in size, slight decrease in size of left inguinal node, decreased L3 paraspinal mass CTs 09/25/2020-interval enlargement of a right liver mass, new 5 mm soft tissue nodule at the midline ventral peritoneum, no change in lytic L3 lesion, innumerable tiny lung nodules-likely inflammatory Cycle 1 FOLFIRI 10/05/2020 Cycle 2 FOLFIRI 10/19/2020 CTs 02/15/2021-enlargement of dominant right hepatic metastasis, enlargement of left inguinal node, stable density at the right paracolic gutter, resolution of omental nodule in the midline, new splenic infarct Cycle 1 FOLFIRI 02/21/2021 Cycle 2 FOLFIRI 03/15/2021 Cycle 3 FOLFIRI 05/31/2021 Cycle 4 FOLFIRI 06/21/2021, Udenyca Cycle 5 FOLFIRI 07/19/2021 CTs 10/02/2021-new lymph nodes in the chest and posterior mediastinum and in the left supraclavicular region as well as new and enlarging retroperitoneal and celiac lymph nodes.  Enlarging hepatic metastatic lesion occupies nearly the entire right hepatic lobe.  Chronic splenic infarct.  New small right and trace left pleural effusions.  Small volume  ascites. encorafenib with Panitumumab     3.  Ongoing tobacco and alcohol use   4.  Mild neutropenia related to chemotherapy 07/10/2017. Neulasta added.   5.  Admission January 2019 with an abdominal wall abscess and pubis osteomyelitis, status post debridement and antibiotics    6.         Pain secondary to #1. 7.         CT 06/16/2019 with nonocclusive thrombus infrarenal IVC.  Anticoagulation initiated. 8.  Left patella fracture following a fall 08/20/2019 9.  Admission 04/11/2020 with a small bowel obstruction, exploratory laparotomy with repair of multiple serosal tears and small bowel resection 04/19/2020, closure of abdomen with application of wound VAC 04/21/2020, pathology from small bowel resection 04/19/2020-acute serositis     Disposition: ***  Betsy Coder, MD  11/06/2021  10:44 AM

## 2021-11-08 ENCOUNTER — Inpatient Hospital Stay: Payer: Medicare PPO

## 2021-11-08 ENCOUNTER — Other Ambulatory Visit: Payer: Self-pay | Admitting: Nurse Practitioner

## 2021-11-08 ENCOUNTER — Other Ambulatory Visit: Payer: Self-pay

## 2021-11-08 VITALS — BP 93/53 | HR 82 | Temp 98.1°F | Resp 18 | Ht 66.0 in | Wt 107.6 lb

## 2021-11-08 DIAGNOSIS — Z79899 Other long term (current) drug therapy: Secondary | ICD-10-CM | POA: Diagnosis not present

## 2021-11-08 DIAGNOSIS — C787 Secondary malignant neoplasm of liver and intrahepatic bile duct: Secondary | ICD-10-CM

## 2021-11-08 DIAGNOSIS — Z923 Personal history of irradiation: Secondary | ICD-10-CM | POA: Diagnosis not present

## 2021-11-08 DIAGNOSIS — C189 Malignant neoplasm of colon, unspecified: Secondary | ICD-10-CM

## 2021-11-08 DIAGNOSIS — Z5112 Encounter for antineoplastic immunotherapy: Secondary | ICD-10-CM | POA: Diagnosis not present

## 2021-11-08 DIAGNOSIS — Z9221 Personal history of antineoplastic chemotherapy: Secondary | ICD-10-CM | POA: Diagnosis not present

## 2021-11-08 LAB — CMP (CANCER CENTER ONLY)
ALT: 34 U/L (ref 0–44)
AST: 29 U/L (ref 15–41)
Albumin: 3 g/dL — ABNORMAL LOW (ref 3.5–5.0)
Alkaline Phosphatase: 413 U/L — ABNORMAL HIGH (ref 38–126)
Anion gap: 9 (ref 5–15)
BUN: 9 mg/dL (ref 8–23)
CO2: 25 mmol/L (ref 22–32)
Calcium: 8 mg/dL — ABNORMAL LOW (ref 8.9–10.3)
Chloride: 101 mmol/L (ref 98–111)
Creatinine: 0.54 mg/dL (ref 0.44–1.00)
GFR, Estimated: >60 mL/min (ref >60)
Glucose, Bld: 109 mg/dL — ABNORMAL HIGH (ref 70–99)
Potassium: 3.4 mmol/L — ABNORMAL LOW (ref 3.5–5.1)
Sodium: 135 mmol/L (ref 135–145)
Total Bilirubin: 2.2 mg/dL — ABNORMAL HIGH (ref 0.3–1.2)
Total Protein: 6.2 g/dL — ABNORMAL LOW (ref 6.5–8.1)

## 2021-11-08 LAB — CBC WITH DIFFERENTIAL (CANCER CENTER ONLY)
Abs Immature Granulocytes: 0.07 10*3/uL (ref 0.00–0.07)
Basophils Absolute: 0.1 10*3/uL (ref 0.0–0.1)
Basophils Relative: 1 %
Eosinophils Absolute: 1.2 10*3/uL — ABNORMAL HIGH (ref 0.0–0.5)
Eosinophils Relative: 13 %
HCT: 36.7 % (ref 36.0–46.0)
Hemoglobin: 12.2 g/dL (ref 12.0–15.0)
Immature Granulocytes: 1 %
Lymphocytes Relative: 12 %
Lymphs Abs: 1.1 10*3/uL (ref 0.7–4.0)
MCH: 30.7 pg (ref 26.0–34.0)
MCHC: 33.2 g/dL (ref 30.0–36.0)
MCV: 92.2 fL (ref 80.0–100.0)
Monocytes Absolute: 0.9 10*3/uL (ref 0.1–1.0)
Monocytes Relative: 10 %
Neutro Abs: 6.1 10*3/uL (ref 1.7–7.7)
Neutrophils Relative %: 63 %
Platelet Count: 176 10*3/uL (ref 150–400)
RBC: 3.98 MIL/uL (ref 3.87–5.11)
RDW: 15.2 % (ref 11.5–15.5)
WBC Count: 9.5 10*3/uL (ref 4.0–10.5)
nRBC: 0 % (ref 0.0–0.2)

## 2021-11-08 LAB — MAGNESIUM: Magnesium: 1.4 mg/dL — ABNORMAL LOW (ref 1.7–2.4)

## 2021-11-08 MED ORDER — HEPARIN SOD (PORK) LOCK FLUSH 100 UNIT/ML IV SOLN
500.0000 [IU] | Freq: Once | INTRAVENOUS | Status: AC | PRN
  Administered 2021-11-08: 500 [IU]

## 2021-11-08 MED ORDER — SODIUM CHLORIDE 0.9 % IV SOLN: Freq: Once | INTRAVENOUS | Status: AC

## 2021-11-08 MED ORDER — SODIUM CHLORIDE 0.9% FLUSH
10.0000 mL | INTRAVENOUS | Status: DC | PRN
  Administered 2021-11-08: 10 mL

## 2021-11-08 MED ORDER — SODIUM CHLORIDE 0.9 % IV SOLN
6.0000 mg/kg | Freq: Once | INTRAVENOUS | Status: AC
  Administered 2021-11-08: 300 mg via INTRAVENOUS
  Filled 2021-11-08: qty 15

## 2021-11-08 MED ORDER — MAGNESIUM OXIDE -MG SUPPLEMENT 400 (240 MG) MG PO TABS: 400.0000 mg | ORAL_TABLET | Freq: Two times a day (BID) | ORAL | 0 refills | Status: DC

## 2021-11-08 NOTE — Patient Instructions (Signed)
[facility]  Discharge Instructions: Thank you for choosing [facility] to provide your oncology and hematology care.   If you have a lab appointment with the [facility], please go directly to the [facility] and check in at the registration area.   Wear comfortable clothing and clothing appropriate for easy access to any Portacath or PICC line.   We strive to give you quality time with your provider. You may need to reschedule your appointment if you arrive late (15 or more minutes).  Arriving late affects you and other patients whose appointments are after yours.  Also, if you miss three or more appointments without notifying the office, you may be dismissed from the clinic at the provider's discretion.      For prescription refill requests, have your pharmacy contact our office and allow 72 hours for refills to be completed.    Today you received the following chemotherapy and/or immunotherapy agents Vectibix      To help prevent nausea and vomiting after your treatment, we encourage you to take your nausea medication as directed.  BELOW ARE SYMPTOMS THAT SHOULD BE REPORTED IMMEDIATELY: *FEVER GREATER THAN 100.4 F (38 C) OR HIGHER *CHILLS OR SWEATING *NAUSEA AND VOMITING THAT IS NOT CONTROLLED WITH YOUR NAUSEA MEDICATION *UNUSUAL SHORTNESS OF BREATH *UNUSUAL BRUISING OR BLEEDING *URINARY PROBLEMS (pain or burning when urinating, or frequent urination) *BOWEL PROBLEMS (unusual diarrhea, constipation, pain near the anus) TENDERNESS IN MOUTH AND THROAT WITH OR WITHOUT PRESENCE OF ULCERS (sore throat, sores in mouth, or a toothache) UNUSUAL RASH, SWELLING OR PAIN  UNUSUAL VAGINAL DISCHARGE OR ITCHING   Items with * indicate a potential emergency and should be followed up as soon as possible or go to the Emergency Department if any problems should occur.  Please show the CHEMOTHERAPY ALERT CARD or IMMUNOTHERAPY ALERT CARD at check-in to the  Emergency Department and triage nurse.  Should you have questions after your visit or need to cancel or reschedule your appointment, please contact [facility]  Dept: [phone]  and follow the prompts.  Office hours are 8:00 a.m. to 4:30 p.m. Monday - Friday. Please note that voicemails left after 4:00 p.m. may not be returned until the following business day.  We are closed weekends and major holidays. You have access to a nurse at all times for urgent questions. Please call the main number to the clinic Dept: [phone] and follow the prompts.   For any non-urgent questions, you may also contact your provider using MyChart. We now offer e-Visits for anyone 67 and older to request care online for non-urgent symptoms. For details visit [email or web address].   Also download the MyChart app! Go to the app store, search "MyChart", open the app, select [facility], and log in with your MyChart username and password.  Due to Covid, a mask is required upon entering the hospital/clinic. If you do not have a mask, one will be given to you upon arrival. For doctor visits, patients may have 1 support person aged 46 or older with them. For treatment visits, patients cannot have anyone with them due to current Covid guidelines and our immunocompromised population.   Panitumumab Solution for Injection What is this medication? PANITUMUMAB (pan i TOOM ue mab) is a monoclonal antibody. It is used to treat colorectal cancer. This medicine may be used for other purposes; ask your health care provider or pharmacist if you have questions. COMMON BRAND NAME(S): Vectibix What  should I tell my care team before I take this medication? They need to know if you have any of these conditions: eye disease, vision problems low levels of calcium, magnesium, or potassium in the blood lung or breathing disease, like asthma skin conditions or sensitivity an unusual or allergic reaction to  panitumumab, other medicines, foods, dyes, or preservatives pregnant or trying to get pregnant breast-feeding How should I use this medication? This drug is given as an infusion into a vein. It is administered in a hospital or clinic by a specially trained health care professional. Talk to your pediatrician regarding the use of this medicine in children. Special care may be needed. Overdosage: If you think you have taken too much of this medicine contact a poison control center or emergency room at once. NOTE: This medicine is only for you. Do not share this medicine with others. What if I miss a dose? It is important not to miss your dose. Call your doctor or health care professional if you are unable to keep an appointment. What may interact with this medication? Do not take this medicine with any of the following medications: bevacizumab This list may not describe all possible interactions. Give your health care provider a list of all the medicines, herbs, non-prescription drugs, or dietary supplements you use. Also tell them if you smoke, drink alcohol, or use illegal drugs. Some items may interact with your medicine. What should I watch for while using this medication? Visit your doctor for checks on your progress. This drug may make you feel generally unwell. This is not uncommon, as chemotherapy can affect healthy cells as well as cancer cells. Report any side effects. Continue your course of treatment even though you feel ill unless your doctor tells you to stop. This medicine can make you more sensitive to the sun. Keep out of the sun while receiving this medicine and for 2 months after the last dose. If you cannot avoid being in the sun, wear protective clothing and use sunscreen. Do not use sun lamps or tanning beds/booths. In some cases, you may be given additional medicines to help with side effects. Follow all directions for their use. Call your doctor or health care professional for  advice if you get a fever, chills or sore throat, or other symptoms of a cold or flu. Do not treat yourself. This drug decreases your body's ability to fight infections. Try to avoid being around people who are sick. Avoid taking products that contain aspirin, acetaminophen, ibuprofen, naproxen, or ketoprofen unless instructed by your doctor. These medicines may hide a fever. Do not become pregnant while taking this medicine and for 2 months after the last dose. Women should inform their doctor if they wish to become pregnant or think they might be pregnant. There is a potential for serious side effects to an unborn child. Talk to your health care professional or pharmacist for more information. Do not breast-feed an infant while taking this medicine or for 2 months after the last dose. What side effects may I notice from receiving this medication? Side effects that you should report to your doctor or health care professional as soon as possible: allergic reactions like skin rash, itching or hives, swelling of the face, lips, or tongue breathing problems changes in vision eye pain fast, irregular heartbeat fever, chills mouth sores red spots on the skin redness, blistering, peeling or loosening of the skin, including inside the mouth signs and symptoms of kidney injury like trouble passing  urine or change in the amount of urine signs and symptoms of low blood pressure like dizziness; feeling faint or lightheaded, falls; unusually weak or tired signs of low calcium like fast heartbeat, muscle cramps or muscle pain; pain, tingling, numbness in the hands or feet; seizures signs and symptoms of low magnesium like muscle cramps, pain, or weakness; tremors; seizures; or fast, irregular heartbeat signs and symptoms of low potassium like muscle cramps or muscle pain; chest pain; dizziness; feeling faint or lightheaded, falls; palpitations; breathing problems; or fast, irregular heartbeat swelling of the  ankles, feet, hands Side effects that usually do not require medical attention (report to your doctor or health care professional if they continue or are bothersome): changes in skin like acne, cracks, skin dryness diarrhea eyelash growth headache mouth sores nail changes nausea, vomiting This list may not describe all possible side effects. Call your doctor for medical advice about side effects. You may report side effects to FDA at 1-800-FDA-1088. Where should I keep my medication? This drug is given in a hospital or clinic and will not be stored at home. NOTE: This sheet is a summary. It may not cover all possible information. If you have questions about this medicine, talk to your doctor, pharmacist, or health care provider.  2022 Elsevier/Gold Standard (2016-07-12 00:00:00)

## 2021-11-08 NOTE — Progress Notes (Signed)
Labs entered for todays visit per phlebotomy

## 2021-11-08 NOTE — Progress Notes (Signed)
Patient presents for treatment. RN assessment completed along with the following:  Labs/vitals reviewed - Yes, and within treatment parameters.   Weight within 10% of previous measurement - Yes Oncology Treatment Attestation completed for current therapy- Yes, on date 10/05/21 Informed consent completed and reflects current therapy/intent - Yes, on date 10/15/21             Provider progress note reviewed - Patient not seen by provider today. Most recent note dated 11/06/21 reviewed. Treatment/Antibody/Supportive plan reviewed - Yes, and there are no adjustments needed for today's treatment. S&H and other orders reviewed - Yes, and there are no additional orders identified. Previous treatment date reviewed - Yes, and the appropriate amount of time has elapsed between treatments. Clinic Hand Off Received from - none   Patient to proceed with treatment.    Per Leander Rams, NP ok to treat with Mag 1.4 and t bili 2.2.

## 2021-11-12 ENCOUNTER — Other Ambulatory Visit: Payer: Self-pay

## 2021-11-12 ENCOUNTER — Telehealth: Payer: Self-pay

## 2021-11-12 MED ORDER — LOPERAMIDE HCL 2 MG PO CAPS: ORAL_CAPSULE | ORAL | 0 refills | Status: DC

## 2021-11-12 NOTE — Telephone Encounter (Unsigned)
Pt called stating she needed something for diarrhea returned call to Pt no answer will try again.

## 2021-11-13 ENCOUNTER — Encounter: Payer: Self-pay | Admitting: Oncology

## 2021-11-14 ENCOUNTER — Other Ambulatory Visit (HOSPITAL_COMMUNITY): Payer: Self-pay

## 2021-11-16 ENCOUNTER — Encounter: Payer: Self-pay | Admitting: *Deleted

## 2021-11-16 ENCOUNTER — Other Ambulatory Visit (HOSPITAL_COMMUNITY): Payer: Self-pay

## 2021-11-16 ENCOUNTER — Other Ambulatory Visit: Payer: Self-pay | Admitting: Nurse Practitioner

## 2021-11-16 DIAGNOSIS — C787 Secondary malignant neoplasm of liver and intrahepatic bile duct: Secondary | ICD-10-CM

## 2021-11-16 NOTE — Progress Notes (Unsigned)
Managed care fowarded application form from [facility] for Yolanda Watson. MD portion completed. Called patient to pick up hard copy of form to take home to complete and return with financial proof of income to be sent back to company with MD portion. She does not have email to send form to her. Confirmed with [facility] that her out of pocket for 30 day supply is $660.95. Patient is not able to afford this.

## 2021-11-21 ENCOUNTER — Inpatient Hospital Stay: Payer: Medicare PPO

## 2021-11-21 ENCOUNTER — Inpatient Hospital Stay: Payer: Medicare PPO | Admitting: Nurse Practitioner

## 2021-11-25 ENCOUNTER — Other Ambulatory Visit: Payer: Self-pay | Admitting: Oncology

## 2021-11-27 ENCOUNTER — Inpatient Hospital Stay: Payer: Medicare PPO | Admitting: Nurse Practitioner

## 2021-11-27 ENCOUNTER — Telehealth: Payer: Self-pay | Admitting: Surgery

## 2021-11-27 ENCOUNTER — Inpatient Hospital Stay: Payer: Medicare PPO

## 2021-11-27 NOTE — Telephone Encounter (Signed)
Pt left a message stating that she had a rash and severe itching.  I called the pt and she stated that she had scabs forming on her back, neck, and some on her face.  She does not report any drainage from the scabs.  Per Ned Card, NP, the pt needs to restart her antibiotic minocycline, and also use a moisturizing cream, like udder cream, to help with the itching.  I called the pt back and discussed the recommendations from [place].  The pt had been using Noxcema, but stated that she would now use her goldbond moisturizing cream.  I emphasized the importance of restarting her minocycline and she verbalized understanding.  She was told to call our office back if she had any more concerns.

## 2021-12-06 ENCOUNTER — Telehealth: Payer: Self-pay | Admitting: Oncology

## 2021-12-06 ENCOUNTER — Telehealth: Payer: Self-pay

## 2021-12-06 ENCOUNTER — Inpatient Hospital Stay: Payer: Medicare PPO

## 2021-12-06 ENCOUNTER — Inpatient Hospital Stay: Payer: Medicare PPO | Admitting: Nurse Practitioner

## 2021-12-06 NOTE — Telephone Encounter (Signed)
Patient called in to reschedule 12/822 appointment to the beginning of next year - patient spoke with RN before being transferred to scheduling. Patient rescheduled to 01/03/2022

## 2021-12-06 NOTE — Telephone Encounter (Signed)
Pt called today to cancel and reschedule appointments after the New Year. Return call to Pt to inform her that it is recommended that she come in before the New Year. Pt declined to come in before the New Year. Pt transferred to scheduling to reschedule appointment.

## 2021-12-10 ENCOUNTER — Other Ambulatory Visit: Payer: Self-pay | Admitting: Nurse Practitioner

## 2021-12-10 DIAGNOSIS — C189 Malignant neoplasm of colon, unspecified: Secondary | ICD-10-CM

## 2021-12-10 NOTE — Telephone Encounter (Signed)
Pt required a refill through my chart

## 2021-12-18 ENCOUNTER — Other Ambulatory Visit: Payer: Self-pay | Admitting: Nurse Practitioner

## 2021-12-18 DIAGNOSIS — C787 Secondary malignant neoplasm of liver and intrahepatic bile duct: Secondary | ICD-10-CM

## 2021-12-18 MED ORDER — OXYCODONE-ACETAMINOPHEN 5-325 MG PO TABS: 1.0000 | ORAL_TABLET | Freq: Four times a day (QID) | ORAL | 0 refills | Status: DC | PRN

## 2021-12-21 ENCOUNTER — Telehealth: Payer: Self-pay | Admitting: Pharmacy Technician

## 2021-12-21 ENCOUNTER — Other Ambulatory Visit (HOSPITAL_COMMUNITY): Payer: Self-pay

## 2021-12-21 NOTE — Telephone Encounter (Signed)
Oral Oncology Patient Advocate Encounter  Received communication from [place] that the patient's eligibility in the patient assistance program for Braftovi was due for re-enrollment.    Coordinated with patients son Erlene Quan to have application signed.  MD portion completed.  The renewal application has been completed and faxed in an effort to keep the patient's out of pocket expense for Braftovi at $0.     Application completed and faxed to [phone].    [employer] patient assistance phone number for follow up is [phone].    This encounter will be updated until final determination.  [facility] [place] Phone [phone] Fax [phone] 12/21/2021 12:15 PM

## 2022-01-03 ENCOUNTER — Inpatient Hospital Stay: Payer: Medicare PPO

## 2022-01-03 ENCOUNTER — Other Ambulatory Visit: Payer: Self-pay

## 2022-01-03 ENCOUNTER — Telehealth: Payer: Self-pay

## 2022-01-03 ENCOUNTER — Inpatient Hospital Stay: Payer: Medicare PPO | Admitting: Nurse Practitioner

## 2022-01-03 ENCOUNTER — Inpatient Hospital Stay: Payer: Medicare PPO | Attending: Nurse Practitioner | Admitting: Nurse Practitioner

## 2022-01-03 ENCOUNTER — Encounter: Payer: Self-pay | Admitting: Nurse Practitioner

## 2022-01-03 DIAGNOSIS — C189 Malignant neoplasm of colon, unspecified: Secondary | ICD-10-CM | POA: Insufficient documentation

## 2022-01-03 DIAGNOSIS — S2232XA Fracture of one rib, left side, initial encounter for closed fracture: Secondary | ICD-10-CM | POA: Insufficient documentation

## 2022-01-03 DIAGNOSIS — D701 Agranulocytosis secondary to cancer chemotherapy: Secondary | ICD-10-CM | POA: Insufficient documentation

## 2022-01-03 DIAGNOSIS — Z79899 Other long term (current) drug therapy: Secondary | ICD-10-CM | POA: Insufficient documentation

## 2022-01-03 DIAGNOSIS — C787 Secondary malignant neoplasm of liver and intrahepatic bile duct: Secondary | ICD-10-CM | POA: Diagnosis not present

## 2022-01-03 DIAGNOSIS — R21 Rash and other nonspecific skin eruption: Secondary | ICD-10-CM | POA: Insufficient documentation

## 2022-01-03 DIAGNOSIS — M549 Dorsalgia, unspecified: Secondary | ICD-10-CM | POA: Insufficient documentation

## 2022-01-03 DIAGNOSIS — S82002A Unspecified fracture of left patella, initial encounter for closed fracture: Secondary | ICD-10-CM | POA: Insufficient documentation

## 2022-01-03 DIAGNOSIS — R188 Other ascites: Secondary | ICD-10-CM | POA: Diagnosis not present

## 2022-01-03 DIAGNOSIS — J90 Pleural effusion, not elsewhere classified: Secondary | ICD-10-CM | POA: Diagnosis not present

## 2022-01-03 DIAGNOSIS — D735 Infarction of spleen: Secondary | ICD-10-CM | POA: Insufficient documentation

## 2022-01-03 DIAGNOSIS — N2889 Other specified disorders of kidney and ureter: Secondary | ICD-10-CM | POA: Diagnosis not present

## 2022-01-03 DIAGNOSIS — C7989 Secondary malignant neoplasm of other specified sites: Secondary | ICD-10-CM | POA: Insufficient documentation

## 2022-01-03 MED ORDER — OXYCODONE-ACETAMINOPHEN 5-325 MG PO TABS: 1.0000 | ORAL_TABLET | Freq: Four times a day (QID) | ORAL | 0 refills | Status: DC | PRN

## 2022-01-03 NOTE — Telephone Encounter (Signed)
Patient left a voice message I called and spoke with the patient.Patient request to have a telephone visit. Patient stated she need a nurse to come to her home to do treatment.

## 2022-01-03 NOTE — Progress Notes (Signed)
Yolanda Watson OFFICE VISIT PROGRESS NOTE  I connected with Yolanda Watson on 01/03/22 at 10:45 AM EST by telephone visit and verified that I am speaking with the correct person using two identifiers.   I discussed the limitations, risks, security and privacy concerns of performing an evaluation and management service by telemedicine and the availability of in-person appointments. I also discussed with the patient that there may be a patient responsible charge related to this service. The patient expressed understanding and agreed to proceed.  Other persons participating in the visit and their role in the encounter:  Spouse  Patient's location:  Home       Provider's location:  Office  Chief Complaint: Skin rash  Diagnosis: Colon cancer  INTERVAL HISTORY:   Yolanda Watson is a 71 year old woman with metastatic colon cancer.  She has most recently been treated with Panitumumab/encorafenib.  She was last seen in the office on 11/06/2021.  At that time she reported she was taking the encorafenib at a dose of 150 mg daily due to poor tolerance.  She last received Panitumumab 11/08/2021.  She canceled several follow-up appointments.  She requested today's appointment be done as a phone visit.  She has continued encorafenib at a dose of 150 mg daily.  She reports a persistent dry skin rash on her forehead and back.  The rash is "scaly".  No hand or foot pain or redness.  She states she is not jaundice.  She is taking minocycline.  Appetite is poor.  Her husband feels she has definitely lost weight since last being in the office.  She is weak.  She has right-sided abdominal and right lower back pain.  She is taking 1 oxycodone tablet a day and requests a refill.  She thinks the lymph node in the left groin is stable.  Diarrhea is controlled with Imodium.  No nausea or vomiting.  She reports being unable to come to follow-up visits due to the need to climb 18  stairs to get to her car.  Her husband states he needs help to get her to the car.  Objective:  Vital signs in last 24 hours:   Lab Results:  Lab Results  Component Value Date   WBC 9.5 11/08/2021   HGB 12.2 11/08/2021   HCT 36.7 11/08/2021   MCV 92.2 11/08/2021   PLT 176 11/08/2021   NEUTROABS 6.1 11/08/2021    Imaging:  No results found.  Medications: I have reviewed the patient's current medications.  Assessment/Plan: Metastatic colon cancer presenting with an Abdominal/pelvic mass      Colon cancer diagnosed in [place] in 2016-pathology report dated 02/15/2015-invasive adenocarcinoma, well-differentiated with prominent mucinous features; microscopic tumor extension into mesenteric adipose tissue and focally involving visceral peritoneum; radial and mucosal margins negative; lymph-vascular invasion with suspicious foci identified;perineural invasion not identified; 16 negative lymph nodes; tumor deposits not identified; pT4apN0; MSI stable,BRAF V600E, ATM, MSS, TMB indeterminant-8, RAS wild-type   CT 04/22/2017 revealed a heterogenous anterior pelvic wall mass, right inguinal mass, top normal sized retroperitoneal and iliac nodes, abutment of the anterior dome of the bladder and a loop of small bowel 04/29/2017 biopsy of anterior abdominal/pelvic wall mass-acellular mucin dissecting fibrous stroma Cycle 1 FOLFOX 05/15/2017 Cycle 2 FOLFOX 05/28/2017 Cycle 3 FOLFOX with Avastin on 06/11/2017 Cycle 4 FOLFOX/Avastin 06/25/2017 Cycle 5 FOLFOX/Avastin 07/10/2017, Neulasta added Restaging CT 07/21/2017-mild decrease in the size of a right inguinal node and the anterior abdominal wall mass Radiation to  the abdominal wall mass 08/18/2017-09/03/2017 Status post excision of abdominal wall mass 10/03/2017--pathology showed adenocarcinoma with excessive extracellular mucin. Margins negative for tumor. Right inguinal wall mass also excised showing metastatic adenocarcinoma with excessive  extracellular mucin in 1 of 8 lymph nodes.  CT abdomen/pelvis 06/16/2019- new right hepatic lobe lesion measuring 2.7 x 1.7 cm. Hypoattenuation within the pancreatic uncinate process measuring 1.5 x 1.6 cm.  Mild left adrenal thickening.  Upper pole left renal mass 3.0 x 3.6 cm.  Infiltrative hypoenhancement involving the lower pole left kidney.  Nonocclusive thrombus within the infrarenal IVC.  Necrotic adenopathy within the small bowel mesentery, new.  New necrotic left inguinal nodes.  Lytic lesion within and adjacent the anterior side of the L3 vertebral body.  Contiguous mass within the left psoas measuring 4.6 x 3.4 cm. Cycle 1 FOLFIRI 06/23/2019 Cycle 2 FOLFIRI 07/08/2019, Udenyca added  Cycle 3 FOLFIRI 07/22/2019 Cycle 4 FOLFIRI 08/04/2019 Cycle 5 FOLFIRI 08/18/2019 CTs 09/08/2019- 3.4 x 2.9 cm metastasis in segment 7 liver, previously 2.7 x 1.7 cm.  14 mm lesion/implant along the inferior right hepatic lobe, possibly new or possibly related to the prior peritoneal implant along the right paracolic gutter.  4 x 6 mm subpleural nodule right lower lobe not clearly evident on the prior study.  Near complete resolution of prior heterogeneous enhancement involving the bilateral kidneys which likely reflected pyelonephritis.  Near complete resolution of prior lymphadenopathy along with small bowel mesentery.  Mild left inguinal lymphadenopathy measuring up to 14 mm, previously 15 mm.  Lytic metastasis at L3 with associated soft tissue metastasis involving the left psoas muscle improved. Cycle 6 FOLFIRI 09/16/2019, Udenyca Cycle 7 FOLFIRI 09/30/2019 Treatment break Cycle 8 FOLFIRI 01/06/2020 Cycle 9 FOLFIRI 01/26/2020 Cycle 10 FOLFIRI 02/24/2020 Cycle 11 FOLFIRI 03/16/2020 CT abdomen/pelvis 04/11/2020-posterior right liver lesion slightly larger, a second lesion in the inferior hepatic lobe is no longer present, portacaval nodes have decreased in size, slight decrease in size of left inguinal node, decreased L3  paraspinal mass CTs 09/25/2020-interval enlargement of a right liver mass, new 5 mm soft tissue nodule at the midline ventral peritoneum, no change in lytic L3 lesion, innumerable tiny lung nodules-likely inflammatory Cycle 1 FOLFIRI 10/05/2020 Cycle 2 FOLFIRI 10/19/2020 CTs 02/15/2021-enlargement of dominant right hepatic metastasis, enlargement of left inguinal node, stable density at the right paracolic gutter, resolution of omental nodule in the midline, new splenic infarct Cycle 1 FOLFIRI 02/21/2021 Cycle 2 FOLFIRI 03/15/2021 Cycle 3 FOLFIRI 05/31/2021 Cycle 4 FOLFIRI 06/21/2021, Udenyca Cycle 5 FOLFIRI 07/19/2021 CTs 10/02/2021-new lymph nodes in the chest and posterior mediastinum and in the left supraclavicular region as well as new and enlarging retroperitoneal and celiac lymph nodes.  Enlarging hepatic metastatic lesion occupies nearly the entire right hepatic lobe.  Chronic splenic infarct.  New small right and trace left pleural effusions.  Small volume ascites. Encorafenib with Panitumumab beginning 10/11/2021, Encorafenib taking at 150 mg daily secondary to nausea; last received Panitumumab 11/08/2021     3.  Ongoing tobacco and alcohol use   4.  Mild neutropenia related to chemotherapy 07/10/2017. Neulasta added.   5.  Admission January 2019 with an abdominal wall abscess and pubis osteomyelitis, status post debridement and antibiotics    6.         Pain secondary to #1. 7.         CT 06/16/2019 with nonocclusive thrombus infrarenal IVC.  Anticoagulation initiated. 8.  Left patella fracture following a fall 08/20/2019 9.  Admission 04/11/2020 with a small  bowel obstruction, exploratory laparotomy with repair of multiple serosal tears and small bowel resection 04/19/2020, closure of abdomen with application of wound VAC 04/21/2020, pathology from small bowel resection 04/19/2020-acute serositis  Disposition: Ms. Lie has metastatic colon cancer.  She is currently taking Encorafenib at a  dose of 150 mg daily.  She last had Panitumumab in November.  Performance status is poor.  She is experiencing anorexia/weight loss and is having increased pain.  She has a rash that may be related to Encorafenib.  I instructed her to place Encorafenib on hold until we can obtain labs and see her in the office.  I offered a visit today.  She is not able to come in today.  She is agreeable to coming in next week if her son can help her husband get her up a flight of stairs to the car.  I will ask our nurse navigator to touch base with Ms. Hugill and her husband regarding assistance to get into the office.  I sent a new prescription to her pharmacy for Percocet.  I discussed the assessment and treatment plan with the patient. The patient was provided an opportunity to ask questions and all were answered. The patient agreed with the plan and demonstrated an understanding of the instructions.   The patient was advised to call back or seek an in-person evaluation if the symptoms worsen or if the condition fails to improve as anticipated.  I provided 17 minutes of non face-to-face telephone visit time during this encounter, and > 50% was spent counseling as documented under my assessment & plan.  Ned Card ANP/GNP-BC   01/03/2022 10:32 AM

## 2022-01-11 ENCOUNTER — Other Ambulatory Visit: Payer: Self-pay

## 2022-01-11 ENCOUNTER — Inpatient Hospital Stay: Payer: Medicare PPO

## 2022-01-11 ENCOUNTER — Encounter: Payer: Self-pay | Admitting: Nurse Practitioner

## 2022-01-11 ENCOUNTER — Telehealth: Payer: Self-pay

## 2022-01-11 ENCOUNTER — Inpatient Hospital Stay (HOSPITAL_BASED_OUTPATIENT_CLINIC_OR_DEPARTMENT_OTHER): Payer: Medicare PPO | Admitting: Nurse Practitioner

## 2022-01-11 VITALS — BP 126/78 | HR 98 | Temp 98.1°F | Resp 20 | Ht 66.0 in | Wt 105.6 lb

## 2022-01-11 DIAGNOSIS — C787 Secondary malignant neoplasm of liver and intrahepatic bile duct: Secondary | ICD-10-CM

## 2022-01-11 DIAGNOSIS — M549 Dorsalgia, unspecified: Secondary | ICD-10-CM | POA: Diagnosis not present

## 2022-01-11 DIAGNOSIS — J90 Pleural effusion, not elsewhere classified: Secondary | ICD-10-CM | POA: Diagnosis not present

## 2022-01-11 DIAGNOSIS — N2889 Other specified disorders of kidney and ureter: Secondary | ICD-10-CM | POA: Diagnosis not present

## 2022-01-11 DIAGNOSIS — C189 Malignant neoplasm of colon, unspecified: Secondary | ICD-10-CM

## 2022-01-11 DIAGNOSIS — R21 Rash and other nonspecific skin eruption: Secondary | ICD-10-CM | POA: Diagnosis not present

## 2022-01-11 DIAGNOSIS — C7989 Secondary malignant neoplasm of other specified sites: Secondary | ICD-10-CM | POA: Diagnosis not present

## 2022-01-11 DIAGNOSIS — Z95828 Presence of other vascular implants and grafts: Secondary | ICD-10-CM

## 2022-01-11 DIAGNOSIS — D701 Agranulocytosis secondary to cancer chemotherapy: Secondary | ICD-10-CM | POA: Diagnosis not present

## 2022-01-11 DIAGNOSIS — D735 Infarction of spleen: Secondary | ICD-10-CM | POA: Diagnosis not present

## 2022-01-11 LAB — CMP (CANCER CENTER ONLY)
ALT: 13 U/L (ref 0–44)
AST: 28 U/L (ref 15–41)
Albumin: 2.8 g/dL — ABNORMAL LOW (ref 3.5–5.0)
Alkaline Phosphatase: 370 U/L — ABNORMAL HIGH (ref 38–126)
Anion gap: 8 (ref 5–15)
BUN: 12 mg/dL (ref 8–23)
CO2: 24 mmol/L (ref 22–32)
Calcium: 8.4 mg/dL — ABNORMAL LOW (ref 8.9–10.3)
Chloride: 103 mmol/L (ref 98–111)
Creatinine: 0.43 mg/dL — ABNORMAL LOW (ref 0.44–1.00)
GFR, Estimated: >60 mL/min (ref >60)
Glucose, Bld: 103 mg/dL — ABNORMAL HIGH (ref 70–99)
Potassium: 3.7 mmol/L (ref 3.5–5.1)
Sodium: 135 mmol/L (ref 135–145)
Total Bilirubin: 0.6 mg/dL (ref 0.3–1.2)
Total Protein: 7 g/dL (ref 6.5–8.1)

## 2022-01-11 LAB — CBC WITH DIFFERENTIAL (CANCER CENTER ONLY)
Abs Immature Granulocytes: 0.06 10*3/uL (ref 0.00–0.07)
Basophils Absolute: 0.1 10*3/uL (ref 0.0–0.1)
Basophils Relative: 1 %
Eosinophils Absolute: 0.6 10*3/uL — ABNORMAL HIGH (ref 0.0–0.5)
Eosinophils Relative: 7 %
HCT: 37.7 % (ref 36.0–46.0)
Hemoglobin: 12.1 g/dL (ref 12.0–15.0)
Immature Granulocytes: 1 %
Lymphocytes Relative: 15 %
Lymphs Abs: 1.4 10*3/uL (ref 0.7–4.0)
MCH: 30.9 pg (ref 26.0–34.0)
MCHC: 32.1 g/dL (ref 30.0–36.0)
MCV: 96.4 fL (ref 80.0–100.0)
Monocytes Absolute: 0.4 10*3/uL (ref 0.1–1.0)
Monocytes Relative: 5 %
Neutro Abs: 6.3 10*3/uL (ref 1.7–7.7)
Neutrophils Relative %: 71 %
Platelet Count: 266 10*3/uL (ref 150–400)
RBC: 3.91 MIL/uL (ref 3.87–5.11)
RDW: 16.5 % — ABNORMAL HIGH (ref 11.5–15.5)
WBC Count: 8.9 10*3/uL (ref 4.0–10.5)
nRBC: 0 % (ref 0.0–0.2)

## 2022-01-11 LAB — CEA (ACCESS): CEA (CHCC): 2253.02 ng/mL — ABNORMAL HIGH (ref 0.00–5.00)

## 2022-01-11 LAB — MAGNESIUM: Magnesium: 1.5 mg/dL — ABNORMAL LOW (ref 1.7–2.4)

## 2022-01-11 MED ORDER — SODIUM CHLORIDE 0.9% FLUSH
10.0000 mL | INTRAVENOUS | Status: DC | PRN
  Administered 2022-01-11: 10 mL via INTRAVENOUS

## 2022-01-11 MED ORDER — OXYCODONE-ACETAMINOPHEN 5-325 MG PO TABS: 1.0000 | ORAL_TABLET | Freq: Four times a day (QID) | ORAL | 0 refills | Status: DC | PRN

## 2022-01-11 MED ORDER — HEPARIN SOD (PORK) LOCK FLUSH 100 UNIT/ML IV SOLN
500.0000 [IU] | Freq: Once | INTRAVENOUS | Status: AC | PRN
  Administered 2022-01-11: 500 [IU] via INTRAVENOUS

## 2022-01-11 NOTE — Progress Notes (Signed)
[place] OFFICE PROGRESS NOTE   Diagnosis: Colon cancer  INTERVAL HISTORY:   Yolanda Watson returns for follow-up.  She was last seen in the office 11/06/2021.  She did not return for subsequent follow-up appointments.  We had a phone visit last week.  At that time she reported taking encorafenib at a dose of 150 mg daily.  She complained of a persistent rash.  Husband reported concern regarding weight loss, poor appetite.  She was instructed to discontinue encorafenib.  She reports the rash is better, now mainly dry skin.  She has intermittent low abdominal and back pain.  Appetite varies.  Bowels are moving.  No neuropathy symptoms.  No nausea or vomiting.  Objective:  Vital signs in last 24 hours:  Blood pressure 126/78, pulse 98, temperature 98.1 F (36.7 C), temperature source Oral, resp. rate 20, height _0  (1.676 m), weight 105 lb 9.6 oz (47.9 kg), SpO2 99 %.   She declined examination on the table.  She was examined in the wheelchair HEENT: No thrush or ulcers. Lymphatics: Firm palpable mass left inguinal region. Resp: Lungs clear bilaterally. Cardio: Regular rate and rhythm. GI: Abdomen is soft.  Left abdomen hernia.  Fullness over the right abdomen, no discrete hepatomegaly. Vascular: Trace edema lower leg bilaterally. Neuro: Alert and oriented.  Moves all extremities. Skin: Skin in general has a dry appearance.  Dry flaking skin at the lower back.   Lab Results:  Lab Results  Component Value Date   WBC 8.9 01/11/2022   HGB 12.1 01/11/2022   HCT 37.7 01/11/2022   MCV 96.4 01/11/2022   PLT 266 01/11/2022   NEUTROABS 6.3 01/11/2022    Imaging:  No results found.  Medications: I have reviewed the patient's current medications.  Assessment/Plan: Metastatic colon cancer presenting with an Abdominal/pelvic mass      Colon cancer diagnosed in [place] in 2016-pathology report dated 02/15/2015-invasive adenocarcinoma, well-differentiated with  prominent mucinous features; microscopic tumor extension into mesenteric adipose tissue and focally involving visceral peritoneum; radial and mucosal margins negative; lymph-vascular invasion with suspicious foci identified;perineural invasion not identified; 16 negative lymph nodes; tumor deposits not identified; pT4apN0; MSI stable,BRAF V600E, ATM, MSS, TMB indeterminant-8, RAS wild-type   CT 04/22/2017 revealed a heterogenous anterior pelvic wall mass, right inguinal mass, top normal sized retroperitoneal and iliac nodes, abutment of the anterior dome of the bladder and a loop of small bowel 04/29/2017 biopsy of anterior abdominal/pelvic wall mass-acellular mucin dissecting fibrous stroma Cycle 1 FOLFOX 05/15/2017 Cycle 2 FOLFOX 05/28/2017 Cycle 3 FOLFOX with Avastin on 06/11/2017 Cycle 4 FOLFOX/Avastin 06/25/2017 Cycle 5 FOLFOX/Avastin 07/10/2017, Neulasta added Restaging CT 07/21/2017-mild decrease in the size of a right inguinal node and the anterior abdominal wall mass Radiation to the abdominal wall mass 08/18/2017-09/03/2017 Status post excision of abdominal wall mass 10/03/2017--pathology showed adenocarcinoma with excessive extracellular mucin. Margins negative for tumor. Right inguinal wall mass also excised showing metastatic adenocarcinoma with excessive extracellular mucin in 1 of 8 lymph nodes.  CT abdomen/pelvis 06/16/2019- new right hepatic lobe lesion measuring 2.7 x 1.7 cm. Hypoattenuation within the pancreatic uncinate process measuring 1.5 x 1.6 cm.  Mild left adrenal thickening.  Upper pole left renal mass 3.0 x 3.6 cm.  Infiltrative hypoenhancement involving the lower pole left kidney.  Nonocclusive thrombus within the infrarenal IVC.  Necrotic adenopathy within the small bowel mesentery, new.  New necrotic left inguinal nodes.  Lytic lesion within and adjacent the anterior side of the L3 vertebral body.  Contiguous  mass within the left psoas measuring 4.6 x 3.4 cm. Cycle 1  FOLFIRI 06/23/2019 Cycle 2 FOLFIRI 07/08/2019, Udenyca added  Cycle 3 FOLFIRI 07/22/2019 Cycle 4 FOLFIRI 08/04/2019 Cycle 5 FOLFIRI 08/18/2019 CTs 09/08/2019- 3.4 x 2.9 cm metastasis in segment 7 liver, previously 2.7 x 1.7 cm.  14 mm lesion/implant along the inferior right hepatic lobe, possibly new or possibly related to the prior peritoneal implant along the right paracolic gutter.  4 x 6 mm subpleural nodule right lower lobe not clearly evident on the prior study.  Near complete resolution of prior heterogeneous enhancement involving the bilateral kidneys which likely reflected pyelonephritis.  Near complete resolution of prior lymphadenopathy along with small bowel mesentery.  Mild left inguinal lymphadenopathy measuring up to 14 mm, previously 15 mm.  Lytic metastasis at L3 with associated soft tissue metastasis involving the left psoas muscle improved. Cycle 6 FOLFIRI 09/16/2019, Udenyca Cycle 7 FOLFIRI 09/30/2019 Treatment break Cycle 8 FOLFIRI 01/06/2020 Cycle 9 FOLFIRI 01/26/2020 Cycle 10 FOLFIRI 02/24/2020 Cycle 11 FOLFIRI 03/16/2020 CT abdomen/pelvis 04/11/2020-posterior right liver lesion slightly larger, a second lesion in the inferior hepatic lobe is no longer present, portacaval nodes have decreased in size, slight decrease in size of left inguinal node, decreased L3 paraspinal mass CTs 09/25/2020-interval enlargement of a right liver mass, new 5 mm soft tissue nodule at the midline ventral peritoneum, no change in lytic L3 lesion, innumerable tiny lung nodules-likely inflammatory Cycle 1 FOLFIRI 10/05/2020 Cycle 2 FOLFIRI 10/19/2020 CTs 02/15/2021-enlargement of dominant right hepatic metastasis, enlargement of left inguinal node, stable density at the right paracolic gutter, resolution of omental nodule in the midline, new splenic infarct Cycle 1 FOLFIRI 02/21/2021 Cycle 2 FOLFIRI 03/15/2021 Cycle 3 FOLFIRI 05/31/2021 Cycle 4 FOLFIRI 06/21/2021, Udenyca Cycle 5 FOLFIRI 07/19/2021 CTs 10/02/2021-new  lymph nodes in the chest and posterior mediastinum and in the left supraclavicular region as well as new and enlarging retroperitoneal and celiac lymph nodes.  Enlarging hepatic metastatic lesion occupies nearly the entire right hepatic lobe.  Chronic splenic infarct.  New small right and trace left pleural effusions.  Small volume ascites. Encorafenib with Panitumumab beginning 10/11/2021, Encorafenib taking at 150 mg daily secondary to nausea; last received Panitumumab 11/08/2021; Encorafenib discontinued 01/03/2022     3.  Ongoing tobacco and alcohol use   4.  Mild neutropenia related to chemotherapy 07/10/2017. Neulasta added.   5.  Admission January 2019 with an abdominal wall abscess and pubis osteomyelitis, status post debridement and antibiotics    6.         Pain secondary to #1. 7.         CT 06/16/2019 with nonocclusive thrombus infrarenal IVC.  Anticoagulation initiated. 8.  Left patella fracture following a fall 08/20/2019 9.  Admission 04/11/2020 with a small bowel obstruction, exploratory laparotomy with repair of multiple serosal tears and small bowel resection 04/19/2020, closure of abdomen with application of wound VAC 04/21/2020, pathology from small bowel resection 04/19/2020-acute serositis    Disposition: Yolanda Watson appears stable.  She was last treated with Panitumumab 11/08/2021.  She continued Encorafenib until last week.  She is not interested in resuming encorafenib.  We discussed additional systemic therapy versus supportive/comfort care.  She is interested in additional treatment.  Last CT scans are from a little over 3 months ago.  We decided to go ahead with restaging CTs next week.  We will see her in follow-up on 01/21/2022.  Plan reviewed with Dr. Benay Spice.  Ned Card ANP/GNP-BC   01/11/2022  10:01 AM

## 2022-01-11 NOTE — Telephone Encounter (Signed)
Called and spoke with patient to informed her CT scan will be on 01/18/22. She needs to arrived 10:30 at [place]. Patient voiced understanding

## 2022-01-11 NOTE — Patient Instructions (Signed)

## 2022-01-13 ENCOUNTER — Other Ambulatory Visit: Payer: Self-pay | Admitting: Oncology

## 2022-01-18 ENCOUNTER — Telehealth: Payer: Self-pay

## 2022-01-18 ENCOUNTER — Encounter (HOSPITAL_BASED_OUTPATIENT_CLINIC_OR_DEPARTMENT_OTHER): Payer: Self-pay

## 2022-01-18 ENCOUNTER — Other Ambulatory Visit: Payer: Self-pay

## 2022-01-18 ENCOUNTER — Ambulatory Visit (HOSPITAL_BASED_OUTPATIENT_CLINIC_OR_DEPARTMENT_OTHER)
Admission: RE | Admit: 2022-01-18 | Discharge: 2022-01-18 | Disposition: A | Discharge status: Home / Self Care | Payer: Medicare PPO | Source: Ambulatory Visit | Attending: Nurse Practitioner | Admitting: Nurse Practitioner

## 2022-01-18 DIAGNOSIS — S2232XA Fracture of one rib, left side, initial encounter for closed fracture: Secondary | ICD-10-CM | POA: Diagnosis not present

## 2022-01-18 DIAGNOSIS — C787 Secondary malignant neoplasm of liver and intrahepatic bile duct: Secondary | ICD-10-CM | POA: Insufficient documentation

## 2022-01-18 DIAGNOSIS — R188 Other ascites: Secondary | ICD-10-CM | POA: Diagnosis not present

## 2022-01-18 DIAGNOSIS — C189 Malignant neoplasm of colon, unspecified: Secondary | ICD-10-CM | POA: Diagnosis not present

## 2022-01-18 DIAGNOSIS — I700 Atherosclerosis of aorta: Secondary | ICD-10-CM | POA: Diagnosis not present

## 2022-01-18 MED ORDER — IOHEXOL 300 MG/ML  SOLN
100.0000 mL | Freq: Once | INTRAMUSCULAR | Status: AC | PRN
  Administered 2022-01-18: 100 mL via INTRAVENOUS

## 2022-01-18 MED ORDER — HEPARIN SOD (PORK) LOCK FLUSH 100 UNIT/ML IV SOLN
500.0000 [IU] | Freq: Once | INTRAVENOUS | Status: AC
  Administered 2022-01-18: 500 [IU] via INTRAVENOUS

## 2022-01-18 NOTE — Telephone Encounter (Signed)
Called the patient to informed her. The office visit on 01/21/21 is change to a telephone visit. Patient voiced understanding

## 2022-01-21 ENCOUNTER — Inpatient Hospital Stay (HOSPITAL_BASED_OUTPATIENT_CLINIC_OR_DEPARTMENT_OTHER): Payer: Medicare PPO | Admitting: Oncology

## 2022-01-21 ENCOUNTER — Other Ambulatory Visit: Payer: Self-pay

## 2022-01-21 DIAGNOSIS — N2889 Other specified disorders of kidney and ureter: Secondary | ICD-10-CM | POA: Diagnosis not present

## 2022-01-21 DIAGNOSIS — C189 Malignant neoplasm of colon, unspecified: Secondary | ICD-10-CM | POA: Diagnosis not present

## 2022-01-21 DIAGNOSIS — J90 Pleural effusion, not elsewhere classified: Secondary | ICD-10-CM | POA: Diagnosis not present

## 2022-01-21 DIAGNOSIS — R21 Rash and other nonspecific skin eruption: Secondary | ICD-10-CM | POA: Diagnosis not present

## 2022-01-21 DIAGNOSIS — C7989 Secondary malignant neoplasm of other specified sites: Secondary | ICD-10-CM | POA: Diagnosis not present

## 2022-01-21 DIAGNOSIS — C787 Secondary malignant neoplasm of liver and intrahepatic bile duct: Secondary | ICD-10-CM

## 2022-01-21 DIAGNOSIS — M549 Dorsalgia, unspecified: Secondary | ICD-10-CM | POA: Diagnosis not present

## 2022-01-21 DIAGNOSIS — D701 Agranulocytosis secondary to cancer chemotherapy: Secondary | ICD-10-CM | POA: Diagnosis not present

## 2022-01-21 DIAGNOSIS — D735 Infarction of spleen: Secondary | ICD-10-CM | POA: Diagnosis not present

## 2022-01-21 MED ORDER — OXYCODONE-ACETAMINOPHEN 5-325 MG PO TABS: 1.0000 | ORAL_TABLET | Freq: Four times a day (QID) | ORAL | 0 refills | Status: DC | PRN

## 2022-01-21 NOTE — Progress Notes (Signed)
[place] OFFICE VISIT PROGRESS NOTE  I connected with Yolanda Watson on 01/21/22 at 11:15 AM EST by telephone and verified that I am speaking with the correct person using two identifiers.   I discussed the limitations, risks, security and privacy concerns of performing an evaluation and management service by telemedicine and the availability of in-person appointments. I also discussed with the patient that there may be a patient responsible charge related to this service. The patient expressed understanding and agreed to proceed.  Other persons participating in the visit and their role in the encounter: Husband  Patient's location: Home Provider's location: Office   Diagnosis: Colon cancer  INTERVAL HISTORY:   Yolanda Watson is seen today for a telehealth visit per her request.  She continues to have a pruritic rash at the lower back.  Her husband looked at the rash today and says it has improved.  She has back pain.  She takes 2 oxycodone tablets twice daily.  Good appetite.  Objective:   Lab Results  Component Value Date   WBC 8.9 01/11/2022   HGB 12.1 01/11/2022   HCT 37.7 01/11/2022   MCV 96.4 01/11/2022   PLT 266 01/11/2022   NEUTROABS 6.3 01/11/2022    Medications: I have reviewed the patient's current medications.  Assessment/Plan: Metastatic colon cancer presenting with an Abdominal/pelvic mass      Colon cancer diagnosed in [place] in 2016-pathology report dated 02/15/2015-invasive adenocarcinoma, well-differentiated with prominent mucinous features; microscopic tumor extension into mesenteric adipose tissue and focally involving visceral peritoneum; radial and mucosal margins negative; lymph-vascular invasion with suspicious foci identified;perineural invasion not identified; 16 negative lymph nodes; tumor deposits not identified; pT4apN0; MSI stable,BRAF V600E, ATM, MSS, TMB indeterminant-8, RAS wild-type   CT  04/22/2017 revealed a heterogenous anterior pelvic wall mass, right inguinal mass, top normal sized retroperitoneal and iliac nodes, abutment of the anterior dome of the bladder and a loop of small bowel 04/29/2017 biopsy of anterior abdominal/pelvic wall mass-acellular mucin dissecting fibrous stroma Cycle 1 FOLFOX 05/15/2017 Cycle 2 FOLFOX 05/28/2017 Cycle 3 FOLFOX with Avastin on 06/11/2017 Cycle 4 FOLFOX/Avastin 06/25/2017 Cycle 5 FOLFOX/Avastin 07/10/2017, Neulasta added Restaging CT 07/21/2017-mild decrease in the size of a right inguinal node and the anterior abdominal wall mass Radiation to the abdominal wall mass 08/18/2017-09/03/2017 Status post excision of abdominal wall mass 10/03/2017--pathology showed adenocarcinoma with excessive extracellular mucin. Margins negative for tumor. Right inguinal wall mass also excised showing metastatic adenocarcinoma with excessive extracellular mucin in 1 of 8 lymph nodes.  CT abdomen/pelvis 06/16/2019- new right hepatic lobe lesion measuring 2.7 x 1.7 cm. Hypoattenuation within the pancreatic uncinate process measuring 1.5 x 1.6 cm.  Mild left adrenal thickening.  Upper pole left renal mass 3.0 x 3.6 cm.  Infiltrative hypoenhancement involving the lower pole left kidney.  Nonocclusive thrombus within the infrarenal IVC.  Necrotic adenopathy within the small bowel mesentery, new.  New necrotic left inguinal nodes.  Lytic lesion within and adjacent the anterior side of the L3 vertebral body.  Contiguous mass within the left psoas measuring 4.6 x 3.4 cm. Cycle 1 FOLFIRI 06/23/2019 Cycle 2 FOLFIRI 07/08/2019, Udenyca added  Cycle 3 FOLFIRI 07/22/2019 Cycle 4 FOLFIRI 08/04/2019 Cycle 5 FOLFIRI 08/18/2019 CTs 09/08/2019- 3.4 x 2.9 cm metastasis in segment 7 liver, previously 2.7 x 1.7 cm.  14 mm lesion/implant along the inferior right hepatic lobe, possibly new or possibly related to the prior peritoneal implant along the right paracolic gutter.  4 x  6 mm subpleural  nodule right lower lobe not clearly evident on the prior study.  Near complete resolution of prior heterogeneous enhancement involving the bilateral kidneys which likely reflected pyelonephritis.  Near complete resolution of prior lymphadenopathy along with small bowel mesentery.  Mild left inguinal lymphadenopathy measuring up to 14 mm, previously 15 mm.  Lytic metastasis at L3 with associated soft tissue metastasis involving the left psoas muscle improved. Cycle 6 FOLFIRI 09/16/2019, Udenyca Cycle 7 FOLFIRI 09/30/2019 Treatment break Cycle 8 FOLFIRI 01/06/2020 Cycle 9 FOLFIRI 01/26/2020 Cycle 10 FOLFIRI 02/24/2020 Cycle 11 FOLFIRI 03/16/2020 CT abdomen/pelvis 04/11/2020-posterior right liver lesion slightly larger, a second lesion in the inferior hepatic lobe is no longer present, portacaval nodes have decreased in size, slight decrease in size of left inguinal node, decreased L3 paraspinal mass CTs 09/25/2020-interval enlargement of a right liver mass, new 5 mm soft tissue nodule at the midline ventral peritoneum, no change in lytic L3 lesion, innumerable tiny lung nodules-likely inflammatory Cycle 1 FOLFIRI 10/05/2020 Cycle 2 FOLFIRI 10/19/2020 CTs 02/15/2021-enlargement of dominant right hepatic metastasis, enlargement of left inguinal node, stable density at the right paracolic gutter, resolution of omental nodule in the midline, new splenic infarct Cycle 1 FOLFIRI 02/21/2021 Cycle 2 FOLFIRI 03/15/2021 Cycle 3 FOLFIRI 05/31/2021 Cycle 4 FOLFIRI 06/21/2021, Udenyca Cycle 5 FOLFIRI 07/19/2021 CTs 10/02/2021-new lymph nodes in the chest and posterior mediastinum and in the left supraclavicular region as well as new and enlarging retroperitoneal and celiac lymph nodes.  Enlarging hepatic metastatic lesion occupies nearly the entire right hepatic lobe.  Chronic splenic infarct.  New small right and trace left pleural effusions.  Small volume ascites. Encorafenib with Panitumumab beginning 10/11/2021, Encorafenib  taking at 150 mg daily secondary to nausea; last received Panitumumab 11/08/2021; Encorafenib discontinued 01/03/2022 CTs 01/18/2022-mild increase in abdominal and left inguinal adenopathy, stable chest adenopathy, new healing nondisplaced left ninth rib fracture, increased ascites, stable lucency at L3, mild enlargement of right hepatic mass     3.  Ongoing tobacco and alcohol use   4.  Mild neutropenia related to chemotherapy 07/10/2017. Neulasta added.   5.  Admission January 2019 with an abdominal wall abscess and pubis osteomyelitis, status post debridement and antibiotics    6.         Pain secondary to #1. 7.         CT 06/16/2019 with nonocclusive thrombus infrarenal IVC.  Anticoagulation initiated. 8.  Left patella fracture following a fall 08/20/2019 9.  Admission 04/11/2020 with a small bowel obstruction, exploratory laparotomy with repair of multiple serosal tears and small bowel resection 04/19/2020, closure of abdomen with application of wound VAC 04/21/2020, pathology from small bowel resection 04/19/2020-acute serositis     Disposition: Yolanda Watson has metastatic colon cancer.  She is currently maintained off of systemic therapy.  She was last treated with panitumumab in November 2022.  She discontinued encorafenib approximately 3 weeks ago.  I discussed the restaging CT findings with Yolanda Watson and her husband.  I reviewed the CT images.  There is slight progression of the dominant right liver mass and several abdominal/pelvic lymph nodes.  There are no new sites of metastatic disease.  No I recommend resuming treatment with encorafenib and panitumumab.  She does not wish to resume treatment at present.  She agrees to follow-up visit in 2 weeks.  She continues to have pruritus secondary to the back rash. She agrees to a follow-up visit during the week of 02/04/2022.  We will discuss treatment options then.  I discussed the assessment and treatment plan with the patient. The patient  was provided an opportunity to ask questions and all were answered. The patient agreed with the plan and demonstrated an understanding of the instructions.   The patient was advised to call back or seek an in-person evaluation if the symptoms worsen or if the condition fails to improve as anticipated.  I provided 25 minutes of chart review, telephone, and documentation time during this encounter, and > 50% was spent counseling as documented under my assessment & plan.  Betsy Coder ANP/GNP-BC   01/21/2022 12:16 PM

## 2022-01-22 ENCOUNTER — Telehealth: Payer: Self-pay

## 2022-01-22 NOTE — Telephone Encounter (Signed)
Wynetta Emery and [employer] called and left a voicemail regarding financial assist for the patient. I forward the message to Dennison Nancy

## 2022-01-31 ENCOUNTER — Other Ambulatory Visit: Payer: Self-pay | Admitting: Nurse Practitioner

## 2022-01-31 DIAGNOSIS — C189 Malignant neoplasm of colon, unspecified: Secondary | ICD-10-CM

## 2022-01-31 MED ORDER — OXYCODONE-ACETAMINOPHEN 5-325 MG PO TABS: 1.0000 | ORAL_TABLET | Freq: Four times a day (QID) | ORAL | 0 refills | Status: DC | PRN

## 2022-02-01 ENCOUNTER — Telehealth: Payer: Self-pay | Admitting: *Deleted

## 2022-02-01 NOTE — Telephone Encounter (Signed)
Received fax from [facility]en[facility] that patient is requesting 90 day supply for her oxycodone. Notified pharmacy that MD will never order 90 day supply. She currently has med ready for pick up today from 01/31/22 refill.

## 2022-02-07 ENCOUNTER — Telehealth: Payer: Self-pay

## 2022-02-07 ENCOUNTER — Inpatient Hospital Stay: Payer: Medicare PPO | Admitting: Nurse Practitioner

## 2022-02-07 ENCOUNTER — Telehealth: Payer: Self-pay | Admitting: Nurse Practitioner

## 2022-02-07 NOTE — Telephone Encounter (Signed)
Called patient back after listening to patients voicemail. Patient wanted to reschedule today's appointment.

## 2022-02-07 NOTE — Telephone Encounter (Signed)
Patient called in to cancel her appointment for today. Patient stated she is very sick and can not get out of the bed.

## 2022-02-14 ENCOUNTER — Other Ambulatory Visit: Payer: Self-pay

## 2022-02-14 ENCOUNTER — Encounter: Payer: Self-pay | Admitting: Nurse Practitioner

## 2022-02-14 ENCOUNTER — Inpatient Hospital Stay: Payer: Medicare PPO | Attending: Nurse Practitioner | Admitting: Nurse Practitioner

## 2022-02-14 VITALS — BP 146/77 | HR 93 | Temp 98.1°F | Resp 18 | Ht 66.0 in | Wt 106.0 lb

## 2022-02-14 DIAGNOSIS — G893 Neoplasm related pain (acute) (chronic): Secondary | ICD-10-CM | POA: Insufficient documentation

## 2022-02-14 DIAGNOSIS — C189 Malignant neoplasm of colon, unspecified: Secondary | ICD-10-CM | POA: Insufficient documentation

## 2022-02-14 DIAGNOSIS — C787 Secondary malignant neoplasm of liver and intrahepatic bile duct: Secondary | ICD-10-CM | POA: Diagnosis not present

## 2022-02-14 DIAGNOSIS — C7989 Secondary malignant neoplasm of other specified sites: Secondary | ICD-10-CM | POA: Insufficient documentation

## 2022-02-14 NOTE — Progress Notes (Signed)
[facility] OFFICE PROGRESS NOTE   Diagnosis: Colon cancer  INTERVAL HISTORY:   Yolanda Watson returns for follow-up.  She was not feeling well last week.  She is better today.  She continues to have low abdominal and back pain.  She takes 2 Percocet every 6 hours with good relief.  No nausea or vomiting.  Bowels are moving.  Skin rash is better.  She has decided against further treatment.  Objective:  Vital signs in last 24 hours:  Blood pressure (!) 146/77, pulse 93, temperature 98.1 F (36.7 C), temperature source Oral, resp. rate 18, height $RemoveBe'5\' 6"'kNuhrjAdv$  (1.676 m), weight 106 lb (48.1 kg), SpO2 98 %.    Lymphatics: Firm palpable node left inguinal region. Resp: Lungs clear bilaterally. Cardio: Regular rate and rhythm. GI: Abdomen is soft.  Left abdomen hernia. Vascular: Pitting edema lower leg bilaterally. Neuro: Alert and oriented. Skin: Skin has a dry appearance. Port-A-Cath without erythema.  Lab Results:  Lab Results  Component Value Date   WBC 8.9 01/11/2022   HGB 12.1 01/11/2022   HCT 37.7 01/11/2022   MCV 96.4 01/11/2022   PLT 266 01/11/2022   NEUTROABS 6.3 01/11/2022    Imaging:  No results found.  Medications: I have reviewed the patient's current medications.  Assessment/Plan: Metastatic colon cancer presenting with an Abdominal/pelvic mass      Colon cancer diagnosed in [place] in 2016-pathology report dated 02/15/2015-invasive adenocarcinoma, well-differentiated with prominent mucinous features; microscopic tumor extension into mesenteric adipose tissue and focally involving visceral peritoneum; radial and mucosal margins negative; lymph-vascular invasion with suspicious foci identified;perineural invasion not identified; 16 negative lymph nodes; tumor deposits not identified; pT4apN0; MSI stable,BRAF V600E, ATM, MSS, TMB indeterminant-8, RAS wild-type   CT 04/22/2017 revealed a heterogenous anterior pelvic wall mass, right inguinal mass, top  normal sized retroperitoneal and iliac nodes, abutment of the anterior dome of the bladder and a loop of small bowel 04/29/2017 biopsy of anterior abdominal/pelvic wall mass-acellular mucin dissecting fibrous stroma Cycle 1 FOLFOX 05/15/2017 Cycle 2 FOLFOX 05/28/2017 Cycle 3 FOLFOX with Avastin on 06/11/2017 Cycle 4 FOLFOX/Avastin 06/25/2017 Cycle 5 FOLFOX/Avastin 07/10/2017, Neulasta added Restaging CT 07/21/2017-mild decrease in the size of a right inguinal node and the anterior abdominal wall mass Radiation to the abdominal wall mass 08/18/2017-09/03/2017 Status post excision of abdominal wall mass 10/03/2017--pathology showed adenocarcinoma with excessive extracellular mucin. Margins negative for tumor. Right inguinal wall mass also excised showing metastatic adenocarcinoma with excessive extracellular mucin in 1 of 8 lymph nodes.  CT abdomen/pelvis 06/16/2019- new right hepatic lobe lesion measuring 2.7 x 1.7 cm. Hypoattenuation within the pancreatic uncinate process measuring 1.5 x 1.6 cm.  Mild left adrenal thickening.  Upper pole left renal mass 3.0 x 3.6 cm.  Infiltrative hypoenhancement involving the lower pole left kidney.  Nonocclusive thrombus within the infrarenal IVC.  Necrotic adenopathy within the small bowel mesentery, new.  New necrotic left inguinal nodes.  Lytic lesion within and adjacent the anterior side of the L3 vertebral body.  Contiguous mass within the left psoas measuring 4.6 x 3.4 cm. Cycle 1 FOLFIRI 06/23/2019 Cycle 2 FOLFIRI 07/08/2019, Udenyca added  Cycle 3 FOLFIRI 07/22/2019 Cycle 4 FOLFIRI 08/04/2019 Cycle 5 FOLFIRI 08/18/2019 CTs 09/08/2019- 3.4 x 2.9 cm metastasis in segment 7 liver, previously 2.7 x 1.7 cm.  14 mm lesion/implant along the inferior right hepatic lobe, possibly new or possibly related to the prior peritoneal implant along the right paracolic gutter.  4 x 6 mm subpleural nodule right lower lobe  not clearly evident on the prior study.  Near complete  resolution of prior heterogeneous enhancement involving the bilateral kidneys which likely reflected pyelonephritis.  Near complete resolution of prior lymphadenopathy along with small bowel mesentery.  Mild left inguinal lymphadenopathy measuring up to 14 mm, previously 15 mm.  Lytic metastasis at L3 with associated soft tissue metastasis involving the left psoas muscle improved. Cycle 6 FOLFIRI 09/16/2019, Udenyca Cycle 7 FOLFIRI 09/30/2019 Treatment break Cycle 8 FOLFIRI 01/06/2020 Cycle 9 FOLFIRI 01/26/2020 Cycle 10 FOLFIRI 02/24/2020 Cycle 11 FOLFIRI 03/16/2020 CT abdomen/pelvis 04/11/2020-posterior right liver lesion slightly larger, a second lesion in the inferior hepatic lobe is no longer present, portacaval nodes have decreased in size, slight decrease in size of left inguinal node, decreased L3 paraspinal mass CTs 09/25/2020-interval enlargement of a right liver mass, new 5 mm soft tissue nodule at the midline ventral peritoneum, no change in lytic L3 lesion, innumerable tiny lung nodules-likely inflammatory Cycle 1 FOLFIRI 10/05/2020 Cycle 2 FOLFIRI 10/19/2020 CTs 02/15/2021-enlargement of dominant right hepatic metastasis, enlargement of left inguinal node, stable density at the right paracolic gutter, resolution of omental nodule in the midline, new splenic infarct Cycle 1 FOLFIRI 02/21/2021 Cycle 2 FOLFIRI 03/15/2021 Cycle 3 FOLFIRI 05/31/2021 Cycle 4 FOLFIRI 06/21/2021, Udenyca Cycle 5 FOLFIRI 07/19/2021 CTs 10/02/2021-new lymph nodes in the chest and posterior mediastinum and in the left supraclavicular region as well as new and enlarging retroperitoneal and celiac lymph nodes.  Enlarging hepatic metastatic lesion occupies nearly the entire right hepatic lobe.  Chronic splenic infarct.  New small right and trace left pleural effusions.  Small volume ascites. Encorafenib with Panitumumab beginning 10/11/2021, Encorafenib taking at 150 mg daily secondary to nausea; last received Panitumumab  11/08/2021; Encorafenib discontinued 01/03/2022 CTs 01/18/2022-mild increase in abdominal and left inguinal adenopathy, stable chest adenopathy, new healing nondisplaced left ninth rib fracture, increased ascites, stable lucency at L3, mild enlargement of right hepatic mass She declines further systemic therapy 02/14/2022     3.  Ongoing tobacco and alcohol use   4.  Mild neutropenia related to chemotherapy 07/10/2017. Neulasta added.   5.  Admission January 2019 with an abdominal wall abscess and pubis osteomyelitis, status post debridement and antibiotics    6.         Pain secondary to #1. 7.         CT 06/16/2019 with nonocclusive thrombus infrarenal IVC.  Anticoagulation initiated. 8.  Left patella fracture following a fall 08/20/2019 9.  Admission 04/11/2020 with a small bowel obstruction, exploratory laparotomy with repair of multiple serosal tears and small bowel resection 04/19/2020, closure of abdomen with application of wound VAC 04/21/2020, pathology from small bowel resection 04/19/2020-acute serositis    Disposition: Ms. Paullin appears unchanged.  We discussed continuing systemic therapy versus a supportive care approach.  She has decided against further treatment of the metastatic colon cancer.  She agrees to a supportive care approach.  She is not interested in hospice at this time.  We discussed CODE STATUS.  She would like to address this with her husband before making a decision.  She is not sure if she can return for a follow-up visit due to transportation issues as well as mobility.  She would like a phone visit.  We will make arrangements for a phone visit in 3 weeks.  Patient seen with Dr. Benay Spice.    Ned Card ANP/GNP-BC   02/14/2022  1:39 PM This was a shared visit with Ned Card.  Ms. Coffie was interviewed and examined.  She has metastatic colon cancer.  She has been maintained off of systemic therapy since early January.  She has decided against further treatment  of the colon cancer.  We recommend hospice care.  She will discuss this with her family.  She has difficulty traveling to the cancer center.  We will schedule a telephone visit for 3 weeks.  I was present for greater than 50% of today's visit.  I performed medical decision making.  Yolanda Manson, MD

## 2022-02-15 ENCOUNTER — Encounter: Payer: Self-pay | Admitting: Oncology

## 2022-02-15 ENCOUNTER — Other Ambulatory Visit: Payer: Self-pay | Admitting: Nurse Practitioner

## 2022-02-15 DIAGNOSIS — C787 Secondary malignant neoplasm of liver and intrahepatic bile duct: Secondary | ICD-10-CM

## 2022-02-15 DIAGNOSIS — C189 Malignant neoplasm of colon, unspecified: Secondary | ICD-10-CM

## 2022-02-15 MED ORDER — OXYCODONE-ACETAMINOPHEN 5-325 MG PO TABS: 1.0000 | ORAL_TABLET | Freq: Four times a day (QID) | ORAL | 0 refills | Status: DC | PRN

## 2022-02-25 ENCOUNTER — Other Ambulatory Visit: Payer: Self-pay | Admitting: Nurse Practitioner

## 2022-02-25 ENCOUNTER — Telehealth: Payer: Self-pay

## 2022-02-25 ENCOUNTER — Encounter: Payer: Self-pay | Admitting: *Deleted

## 2022-02-25 DIAGNOSIS — C189 Malignant neoplasm of colon, unspecified: Secondary | ICD-10-CM

## 2022-02-25 DIAGNOSIS — C787 Secondary malignant neoplasm of liver and intrahepatic bile duct: Secondary | ICD-10-CM

## 2022-02-25 MED ORDER — OXYCODONE-ACETAMINOPHEN 5-325 MG PO TABS: 1.0000 | ORAL_TABLET | Freq: Four times a day (QID) | ORAL | 0 refills | Status: DC | PRN

## 2022-02-25 NOTE — Telephone Encounter (Signed)
Braftovi discontinued on 01/03/22.

## 2022-02-25 NOTE — Telephone Encounter (Signed)
Patient called and requested a refill on Percocet. The requested was place on the provider desk

## 2022-02-25 NOTE — Progress Notes (Signed)
Received faxed notification from [facility] that request for assistance for Xarelto has been denied due to patient having insurance.

## 2022-03-07 ENCOUNTER — Other Ambulatory Visit: Payer: Self-pay

## 2022-03-07 ENCOUNTER — Inpatient Hospital Stay: Payer: Medicare PPO | Attending: Nurse Practitioner | Admitting: Oncology

## 2022-03-07 DIAGNOSIS — C787 Secondary malignant neoplasm of liver and intrahepatic bile duct: Secondary | ICD-10-CM

## 2022-03-07 DIAGNOSIS — C189 Malignant neoplasm of colon, unspecified: Secondary | ICD-10-CM

## 2022-03-07 MED ORDER — OXYCODONE-ACETAMINOPHEN 5-325 MG PO TABS: 1.0000 | ORAL_TABLET | Freq: Four times a day (QID) | ORAL | 0 refills | Status: DC | PRN

## 2022-03-07 NOTE — Progress Notes (Signed)
Yolanda Watson OFFICE VISIT PROGRESS NOTE  I connected with Yolanda Watson on 03/07/22 at 10:20 AM EST by telephone and verified that I am speaking with the correct person using two identifiers.   I discussed the limitations, risks, security and privacy concerns of performing an evaluation and management service by telemedicine and the availability of in-person appointments. I also discussed with the patient that there may be a patient responsible charge related to this service. The patient expressed understanding and agreed to proceed.  Other persons participating in the visit and their role in the encounter: Husband  Patient's location: Home Provider's location: Office   Diagnosis: Colon cancer  INTERVAL HISTORY:   Yolanda Watson is seen today for a telephone visit per her request.  Her husband was present on the phone visit. She continues to have pain in the abdomen and lower back.  She takes 2 oxycodone tablets every 6 hours.  She is having bowel movements.  The left inguinal mass has not changed.  Good appetite.  No dyspnea.  She is ambulatory in the home.  Lab Results:  Lab Results  Component Value Date   WBC 8.9 01/11/2022   HGB 12.1 01/11/2022   HCT 37.7 01/11/2022   MCV 96.4 01/11/2022   PLT 266 01/11/2022   NEUTROABS 6.3 01/11/2022   Medications: I have reviewed the patient's current medications.  Assessment/Plan: Metastatic colon cancer presenting with an Abdominal/pelvic mass      Colon cancer diagnosed in [place] in 2016-pathology report dated 02/15/2015-invasive adenocarcinoma, well-differentiated with prominent mucinous features; microscopic tumor extension into mesenteric adipose tissue and focally involving visceral peritoneum; radial and mucosal margins negative; lymph-vascular invasion with suspicious foci identified;perineural invasion not identified; 16 negative lymph nodes; tumor deposits not identified; pT4apN0; MSI  stable,BRAF V600E, ATM, MSS, TMB indeterminant-8, RAS wild-type   CT 04/22/2017 revealed a heterogenous anterior pelvic wall mass, right inguinal mass, top normal sized retroperitoneal and iliac nodes, abutment of the anterior dome of the bladder and a loop of small bowel 04/29/2017 biopsy of anterior abdominal/pelvic wall mass-acellular mucin dissecting fibrous stroma Cycle 1 FOLFOX 05/15/2017 Cycle 2 FOLFOX 05/28/2017 Cycle 3 FOLFOX with Avastin on 06/11/2017 Cycle 4 FOLFOX/Avastin 06/25/2017 Cycle 5 FOLFOX/Avastin 07/10/2017, Neulasta added Restaging CT 07/21/2017-mild decrease in the size of a right inguinal node and the anterior abdominal wall mass Radiation to the abdominal wall mass 08/18/2017-09/03/2017 Status post excision of abdominal wall mass 10/03/2017--pathology showed adenocarcinoma with excessive extracellular mucin. Margins negative for tumor. Right inguinal wall mass also excised showing metastatic adenocarcinoma with excessive extracellular mucin in 1 of 8 lymph nodes.  CT abdomen/pelvis 06/16/2019- new right hepatic lobe lesion measuring 2.7 x 1.7 cm. Hypoattenuation within the pancreatic uncinate process measuring 1.5 x 1.6 cm.  Mild left adrenal thickening.  Upper pole left renal mass 3.0 x 3.6 cm.  Infiltrative hypoenhancement involving the lower pole left kidney.  Nonocclusive thrombus within the infrarenal IVC.  Necrotic adenopathy within the small bowel mesentery, new.  New necrotic left inguinal nodes.  Lytic lesion within and adjacent the anterior side of the L3 vertebral body.  Contiguous mass within the left psoas measuring 4.6 x 3.4 cm. Cycle 1 FOLFIRI 06/23/2019 Cycle 2 FOLFIRI 07/08/2019, Udenyca added  Cycle 3 FOLFIRI 07/22/2019 Cycle 4 FOLFIRI 08/04/2019 Cycle 5 FOLFIRI 08/18/2019 CTs 09/08/2019- 3.4 x 2.9 cm metastasis in segment 7 liver, previously 2.7 x 1.7 cm.  14 mm lesion/implant along the inferior right hepatic lobe, possibly new or possibly  related to the prior  peritoneal implant along the right paracolic gutter.  4 x 6 mm subpleural nodule right lower lobe not clearly evident on the prior study.  Near complete resolution of prior heterogeneous enhancement involving the bilateral kidneys which likely reflected pyelonephritis.  Near complete resolution of prior lymphadenopathy along with small bowel mesentery.  Mild left inguinal lymphadenopathy measuring up to 14 mm, previously 15 mm.  Lytic metastasis at L3 with associated soft tissue metastasis involving the left psoas muscle improved. Cycle 6 FOLFIRI 09/16/2019, Udenyca Cycle 7 FOLFIRI 09/30/2019 Treatment break Cycle 8 FOLFIRI 01/06/2020 Cycle 9 FOLFIRI 01/26/2020 Cycle 10 FOLFIRI 02/24/2020 Cycle 11 FOLFIRI 03/16/2020 CT abdomen/pelvis 04/11/2020-posterior right liver lesion slightly larger, a second lesion in the inferior hepatic lobe is no longer present, portacaval nodes have decreased in size, slight decrease in size of left inguinal node, decreased L3 paraspinal mass CTs 09/25/2020-interval enlargement of a right liver mass, new 5 mm soft tissue nodule at the midline ventral peritoneum, no change in lytic L3 lesion, innumerable tiny lung nodules-likely inflammatory Cycle 1 FOLFIRI 10/05/2020 Cycle 2 FOLFIRI 10/19/2020 CTs 02/15/2021-enlargement of dominant right hepatic metastasis, enlargement of left inguinal node, stable density at the right paracolic gutter, resolution of omental nodule in the midline, new splenic infarct Cycle 1 FOLFIRI 02/21/2021 Cycle 2 FOLFIRI 03/15/2021 Cycle 3 FOLFIRI 05/31/2021 Cycle 4 FOLFIRI 06/21/2021, Udenyca Cycle 5 FOLFIRI 07/19/2021 CTs 10/02/2021-new lymph nodes in the chest and posterior mediastinum and in the left supraclavicular region as well as new and enlarging retroperitoneal and celiac lymph nodes.  Enlarging hepatic metastatic lesion occupies nearly the entire right hepatic lobe.  Chronic splenic infarct.  New small right and trace left pleural effusions.  Small volume  ascites. Encorafenib with Panitumumab beginning 10/11/2021, Encorafenib taking at 150 mg daily secondary to nausea; last received Panitumumab 11/08/2021; Encorafenib discontinued 01/03/2022 CTs 01/18/2022-mild increase in abdominal and left inguinal adenopathy, stable chest adenopathy, new healing nondisplaced left ninth rib fracture, increased ascites, stable lucency at L3, mild enlargement of right hepatic mass She declines further systemic therapy 02/14/2022     3.  Ongoing tobacco and alcohol use   4.  Mild neutropenia related to chemotherapy 07/10/2017. Neulasta added.   5.  Admission January 2019 with an abdominal wall abscess and pubis osteomyelitis, status post debridement and antibiotics    6.         Pain secondary to #1. 7.         CT 06/16/2019 with nonocclusive thrombus infrarenal IVC.  Anticoagulation initiated. 8.  Left patella fracture following a fall 08/20/2019 9.  Admission 04/11/2020 with a small bowel obstruction, exploratory laparotomy with repair of multiple serosal tears and small bowel resection 04/19/2020, closure of abdomen with application of wound VAC 04/21/2020, pathology from small bowel resection 04/19/2020-acute serositis      Disposition: Yolanda Watson has metastatic colon cancer.  She has decided against further treatment of the cancer.  She has pain secondary to abdominal/retroperitoneal lymphadenopathy and a dominant right liver mass.  She will continue oxycodone as needed for pain.  She declined a long-acting narcotic.  Yolanda Watson agrees to a hospice referral for home hospice care.  She has not made a decision on CODE STATUS.  She continues to discuss this with her husband.  She would like to be scheduled for a telehealth visit in approximately 3 weeks.   I discussed the assessment and treatment plan with the patient. The patient was provided an opportunity to ask questions and all were  answered. The patient agreed with the plan and demonstrated an understanding  of the instructions.   The patient was advised to call back or seek an in-person evaluation if the symptoms worsen or if the condition fails to improve as anticipated.  I provided 20 minutes of chart review, telephone, and documentation time during this encounter, and > 50% was spent counseling as documented under my assessment & plan.  Betsy Coder MD  03/07/2022 10:54 AM

## 2022-03-18 ENCOUNTER — Other Ambulatory Visit: Payer: Self-pay | Admitting: Nurse Practitioner

## 2022-03-18 ENCOUNTER — Telehealth: Payer: Self-pay

## 2022-03-18 DIAGNOSIS — C787 Secondary malignant neoplasm of liver and intrahepatic bile duct: Secondary | ICD-10-CM

## 2022-03-18 MED ORDER — OXYCODONE-ACETAMINOPHEN 5-325 MG PO TABS: 1.0000 | ORAL_TABLET | Freq: Four times a day (QID) | ORAL | 0 refills | Status: DC | PRN

## 2022-03-18 NOTE — Telephone Encounter (Signed)
Patient called and request a refill on her Percocet. The request was place on the provider desk ?

## 2022-03-28 ENCOUNTER — Other Ambulatory Visit: Payer: Self-pay

## 2022-03-28 ENCOUNTER — Telehealth: Payer: Self-pay

## 2022-03-28 ENCOUNTER — Inpatient Hospital Stay (HOSPITAL_BASED_OUTPATIENT_CLINIC_OR_DEPARTMENT_OTHER): Payer: Medicare PPO | Admitting: Nurse Practitioner

## 2022-03-28 ENCOUNTER — Encounter: Payer: Self-pay | Admitting: Nurse Practitioner

## 2022-03-28 DIAGNOSIS — C189 Malignant neoplasm of colon, unspecified: Secondary | ICD-10-CM

## 2022-03-28 DIAGNOSIS — C787 Secondary malignant neoplasm of liver and intrahepatic bile duct: Secondary | ICD-10-CM | POA: Diagnosis not present

## 2022-03-28 MED ORDER — MORPHINE SULFATE ER 15 MG PO TBCR: 15.0000 mg | EXTENDED_RELEASE_TABLET | Freq: Two times a day (BID) | ORAL | 0 refills | Status: DC

## 2022-03-28 MED ORDER — OXYCODONE-ACETAMINOPHEN 5-325 MG PO TABS: 1.0000 | ORAL_TABLET | Freq: Four times a day (QID) | ORAL | 0 refills | Status: DC | PRN

## 2022-03-28 NOTE — Progress Notes (Signed)
?[facility] ? ? ?HEMATOLOGY- ONCOLOGY TeleHEALTH OFFICE VISIT PROGRESS NOTE ? ?I connected with Yolanda Watson on 03/28/22 at 11:15 AM EDT by telephone visit and verified that I am speaking with the correct person using two identifiers. ?  ?I discussed the limitations, risks, security and privacy concerns of performing an evaluation and management service by telemedicine and the availability of in-person appointments. I also discussed with the patient that there may be a patient responsible charge related to this service. The patient expressed understanding and agreed to proceed. ? ?Other persons participating in the visit and their role in the encounter:  Spouse ? ?Patient's location:  Home ?Provider's location:  Office ? ?Chief Complaint:  Colon cancer ? ?Diagnosis: Colon cancer ? ?INTERVAL HISTORY:  ? ?Yolanda Watson is seen today for a telephone visit per her request.  Her husband is present on the call as well.  She continues to have pain at the abdomen and lower back.  She is taking 3 oxycodone pain pills every 6 hours with fairly good relief.  She would like to try a long-acting pain medicine so she can sleep through the night.  Bowels are moving.  She takes a laxative if needed.  She feels the left inguinal lymph node mass is stable.  She thinks she may be developing a new right inguinal lymph node.  She denies shortness of breath.  No bleeding.  Overall good appetite. ? ? ?Lab Results: ? ?Lab Results  ?Component Value Date  ? WBC 8.9 01/11/2022  ? HGB 12.1 01/11/2022  ? HCT 37.7 01/11/2022  ? MCV 96.4 01/11/2022  ? PLT 266 01/11/2022  ? NEUTROABS 6.3 01/11/2022  ? ? ?Imaging: ? ?No results found. ? ?Medications: I have reviewed the patient's current medications. ? ?Assessment/Plan: ?Metastatic colon cancer presenting with an Abdominal/pelvic mass ?     Colon cancer diagnosed in [place] in 2016-pathology report dated 02/15/2015-invasive adenocarcinoma, well-differentiated with prominent mucinous  features; microscopic tumor extension into mesenteric adipose tissue and focally involving visceral peritoneum; radial and mucosal margins negative; lymph-vascular invasion with suspicious foci identified;perineural invasion not identified; 16 negative lymph nodes; tumor deposits not identified; pT4apN0; MSI stable,BRAF V600E, ATM, MSS, TMB indeterminant-8, RAS wild-type ?  ?CT 04/22/2017 revealed a heterogenous anterior pelvic wall mass, right inguinal mass, top normal sized retroperitoneal and iliac nodes, abutment of the anterior dome of the bladder and a loop of small bowel ?04/29/2017 biopsy of anterior abdominal/pelvic wall mass-acellular mucin dissecting fibrous stroma ?Cycle 1 FOLFOX 05/15/2017 ?Cycle 2 FOLFOX 05/28/2017 ?Cycle 3 FOLFOX with Avastin on 06/11/2017 ?Cycle 4 FOLFOX/Avastin 06/25/2017 ?Cycle 5 FOLFOX/Avastin 07/10/2017, Neulasta added ?Restaging CT 07/21/2017-mild decrease in the size of a right inguinal node and the anterior abdominal wall mass ?Radiation to the abdominal wall mass 08/18/2017-09/03/2017 ?Status post excision of abdominal wall mass 10/03/2017--pathology showed adenocarcinoma with excessive extracellular mucin. Margins negative for tumor. Right inguinal wall mass also excised showing metastatic adenocarcinoma with excessive extracellular mucin in 1 of 8 lymph nodes.  ?CT abdomen/pelvis 06/16/2019- new right hepatic lobe lesion measuring 2.7 x 1.7 cm. Hypoattenuation within the pancreatic uncinate process measuring 1.5 x 1.6 cm.  Mild left adrenal thickening.  Upper pole left renal mass 3.0 x 3.6 cm.  Infiltrative hypoenhancement involving the lower pole left kidney.  Nonocclusive thrombus within the infrarenal IVC.  Necrotic adenopathy within the small bowel mesentery, new.  New necrotic left inguinal nodes.  Lytic lesion within and adjacent the anterior side of the L3 vertebral body.  Contiguous  mass within the left psoas measuring 4.6 x 3.4 cm. ?Cycle 1 FOLFIRI 06/23/2019 ?Cycle  2 FOLFIRI 07/08/2019, Udenyca added  ?Cycle 3 FOLFIRI 07/22/2019 ?Cycle 4 FOLFIRI 08/04/2019 ?Cycle 5 FOLFIRI 08/18/2019 ?CTs 09/08/2019- 3.4 x 2.9 cm metastasis in segment 7 liver, previously 2.7 x 1.7 cm.  14 mm lesion/implant along the inferior right hepatic lobe, possibly new or possibly related to the prior peritoneal implant along the right paracolic gutter.  4 x 6 mm subpleural nodule right lower lobe not clearly evident on the prior study.  Near complete resolution of prior heterogeneous enhancement involving the bilateral kidneys which likely reflected pyelonephritis.  Near complete resolution of prior lymphadenopathy along with small bowel mesentery.  Mild left inguinal lymphadenopathy measuring up to 14 mm, previously 15 mm.  Lytic metastasis at L3 with associated soft tissue metastasis involving the left psoas muscle improved. ?Cycle 6 FOLFIRI 09/16/2019, Udenyca ?Cycle 7 FOLFIRI 09/30/2019 ?Treatment break ?Cycle 8 FOLFIRI 01/06/2020 ?Cycle 9 FOLFIRI 01/26/2020 ?Cycle 10 FOLFIRI 02/24/2020 ?Cycle 11 FOLFIRI 03/16/2020 ?CT abdomen/pelvis 04/11/2020-posterior right liver lesion slightly larger, a second lesion in the inferior hepatic lobe is no longer present, portacaval nodes have decreased in size, slight decrease in size of left inguinal node, decreased L3 paraspinal mass ?CTs 09/25/2020-interval enlargement of a right liver mass, new 5 mm soft tissue nodule at the midline ventral peritoneum, no change in lytic L3 lesion, innumerable tiny lung nodules-likely inflammatory ?Cycle 1 FOLFIRI 10/05/2020 ?Cycle 2 FOLFIRI 10/19/2020 ?CTs 02/15/2021-enlargement of dominant right hepatic metastasis, enlargement of left inguinal node, stable density at the right paracolic gutter, resolution of omental nodule in the midline, new splenic infarct ?Cycle 1 FOLFIRI 02/21/2021 ?Cycle 2 FOLFIRI 03/15/2021 ?Cycle 3 FOLFIRI 05/31/2021 ?Cycle 4 FOLFIRI 06/21/2021, Udenyca ?Cycle 5 FOLFIRI 07/19/2021 ?CTs 10/02/2021-new lymph nodes in the chest and  posterior mediastinum and in the left supraclavicular region as well as new and enlarging retroperitoneal and celiac lymph nodes.  Enlarging hepatic metastatic lesion occupies nearly the entire right hepatic lobe.  Chronic splenic infarct.  New small right and trace left pleural effusions.  Small volume ascites. ?Encorafenib with Panitumumab beginning 10/11/2021, Encorafenib taking at 150 mg daily secondary to nausea; last received Panitumumab 11/08/2021; Encorafenib discontinued 01/03/2022 ?CTs 01/18/2022-mild increase in abdominal and left inguinal adenopathy, stable chest adenopathy, new healing nondisplaced left ninth rib fracture, increased ascites, stable lucency at L3, mild enlargement of right hepatic mass ?She declines further systemic therapy 02/14/2022 ?  ?  ?3.  Ongoing tobacco and alcohol use ?  ?4.  Mild neutropenia related to chemotherapy 07/10/2017. Neulasta added. ?  ?5.  Admission January 2019 with an abdominal wall abscess and pubis osteomyelitis, status post debridement and antibiotics  ?  ?6.         Pain secondary to #1. ?7.         CT 06/16/2019 with nonocclusive thrombus infrarenal IVC.  Anticoagulation initiated. ?8.  Left patella fracture following a fall 08/20/2019 ?9.  Admission 04/11/2020 with a small bowel obstruction, exploratory laparotomy with repair of multiple serosal tears and small bowel resection 04/19/2020, closure of abdomen with application of wound VAC 04/21/2020, pathology from small bowel resection 04/19/2020-acute serositis ?  ?  ? ?Disposition: Ms. Street has metastatic colon cancer.  She has been referred to hospice and reports an initial visit is scheduled next week.  She continues to have abdominal and back pain.  She is taking frequent oxycodone.  She would like to try a long-acting narcotic.  We are sending MS Contin  15 mg every 12 hours to her pharmacy.  She would like to have another telehealth visit in about 3 weeks.  She is aware we are available sooner if needed. ? ? ?I  discussed the assessment and treatment plan with the patient. The patient was provided an opportunity to ask questions and all were answered. The patient agreed with the plan and demonstrated an under

## 2022-03-28 NOTE — Telephone Encounter (Signed)
TC to [facility] to see if referral for hospice was received on Pt. Spoke with Jasmin at [facility] who confirmed a referral was not received. Verbal referral made. Delana Meyer will get all other information from [place]. ?

## 2022-04-09 ENCOUNTER — Telehealth: Payer: Self-pay

## 2022-04-09 NOTE — Telephone Encounter (Signed)
TC from [place] with [facility]. [phone] Calling to report Pt having increased pain in her Back and abdomen. Pt is currently taking 15 mg of morphine twice daily and two percocet every 6 hours. Mendel Ryder inquiring if Dr Benay Spice would want Dr Lyman Speller to make changes. Discussed with Dr Benay Spice, Dr Benay Spice OK with Dr Lyman Speller managing pain meds. Mendel Ryder verbalized understanding. ?

## 2022-04-18 ENCOUNTER — Inpatient Hospital Stay: Payer: Medicare PPO | Attending: Nurse Practitioner | Admitting: Oncology

## 2022-04-18 DIAGNOSIS — C189 Malignant neoplasm of colon, unspecified: Secondary | ICD-10-CM

## 2022-04-18 DIAGNOSIS — C787 Secondary malignant neoplasm of liver and intrahepatic bile duct: Secondary | ICD-10-CM | POA: Diagnosis not present

## 2022-04-18 NOTE — Progress Notes (Signed)
?[place] ? ? ?HEMATOLOGY- ONCOLOGY TeleHEALTH OFFICE VISIT PROGRESS NOTE ? ?I connected with with Cy Blamer on 04/18/22 at 11:40 AM EDT by telephone and verified that I am speaking with the correct person using two identifiers. ?  ?I discussed the limitations, risks, security and privacy concerns of performing an evaluation and management service by telemedicine and the availability of in-person appointments. I also discussed with the patient that there may be a patient responsible charge related to this service. The patient expressed understanding and agreed to proceed. ? ?Other persons participating in the visit and their role in the encounter: Husband ? ?Patient's location: Home ?Provider's location: Office ? ? ?Diagnosis: Colon cancer ? ?INTERVAL HISTORY:  ? ?Ms. Padovano is seen today for a telehealth visit per her request.  Her husband was present by telephone. ?She has enrolled in home hospice care.  She reports this has been helpful.  She is receiving assistance with bathing.  The MS Contin was increased to 30 mg twice daily.  She will begin the higher dose today.  She takes oxycodone for breakthrough pain.  She reports increased pain in the right upper abdomen and back today. ?Constipation is relieved with lactulose. ? ?Objective: ? ? ?Lab Results  ?Component Value Date  ? WBC 8.9 01/11/2022  ? HGB 12.1 01/11/2022  ? HCT 37.7 01/11/2022  ? MCV 96.4 01/11/2022  ? PLT 266 01/11/2022  ? NEUTROABS 6.3 01/11/2022  ? ?Medications: I have reviewed the patient's current medications. ? ?Assessment/Plan: ?Metastatic colon cancer presenting with an Abdominal/pelvic mass ?     Colon cancer diagnosed in [place] in 2016-pathology report dated 02/15/2015-invasive adenocarcinoma, well-differentiated with prominent mucinous features; microscopic tumor extension into mesenteric adipose tissue and focally involving visceral peritoneum; radial and mucosal margins negative; lymph-vascular invasion with  suspicious foci identified;perineural invasion not identified; 16 negative lymph nodes; tumor deposits not identified; pT4apN0; MSI stable,BRAF V600E, ATM, MSS, TMB indeterminant-8, RAS wild-type ?  ?CT 04/22/2017 revealed a heterogenous anterior pelvic wall mass, right inguinal mass, top normal sized retroperitoneal and iliac nodes, abutment of the anterior dome of the bladder and a loop of small bowel ?04/29/2017 biopsy of anterior abdominal/pelvic wall mass-acellular mucin dissecting fibrous stroma ?Cycle 1 FOLFOX 05/15/2017 ?Cycle 2 FOLFOX 05/28/2017 ?Cycle 3 FOLFOX with Avastin on 06/11/2017 ?Cycle 4 FOLFOX/Avastin 06/25/2017 ?Cycle 5 FOLFOX/Avastin 07/10/2017, Neulasta added ?Restaging CT 07/21/2017-mild decrease in the size of a right inguinal node and the anterior abdominal wall mass ?Radiation to the abdominal wall mass 08/18/2017-09/03/2017 ?Status post excision of abdominal wall mass 10/03/2017--pathology showed adenocarcinoma with excessive extracellular mucin. Margins negative for tumor. Right inguinal wall mass also excised showing metastatic adenocarcinoma with excessive extracellular mucin in 1 of 8 lymph nodes.  ?CT abdomen/pelvis 06/16/2019- new right hepatic lobe lesion measuring 2.7 x 1.7 cm. Hypoattenuation within the pancreatic uncinate process measuring 1.5 x 1.6 cm.  Mild left adrenal thickening.  Upper pole left renal mass 3.0 x 3.6 cm.  Infiltrative hypoenhancement involving the lower pole left kidney.  Nonocclusive thrombus within the infrarenal IVC.  Necrotic adenopathy within the small bowel mesentery, new.  New necrotic left inguinal nodes.  Lytic lesion within and adjacent the anterior side of the L3 vertebral body.  Contiguous mass within the left psoas measuring 4.6 x 3.4 cm. ?Cycle 1 FOLFIRI 06/23/2019 ?Cycle 2 FOLFIRI 07/08/2019, Udenyca added  ?Cycle 3 FOLFIRI 07/22/2019 ?Cycle 4 FOLFIRI 08/04/2019 ?Cycle 5 FOLFIRI 08/18/2019 ?CTs 09/08/2019- 3.4 x 2.9 cm metastasis in segment 7 liver,  previously  2.7 x 1.7 cm.  14 mm lesion/implant along the inferior right hepatic lobe, possibly new or possibly related to the prior peritoneal implant along the right paracolic gutter.  4 x 6 mm subpleural nodule right lower lobe not clearly evident on the prior study.  Near complete resolution of prior heterogeneous enhancement involving the bilateral kidneys which likely reflected pyelonephritis.  Near complete resolution of prior lymphadenopathy along with small bowel mesentery.  Mild left inguinal lymphadenopathy measuring up to 14 mm, previously 15 mm.  Lytic metastasis at L3 with associated soft tissue metastasis involving the left psoas muscle improved. ?Cycle 6 FOLFIRI 09/16/2019, Udenyca ?Cycle 7 FOLFIRI 09/30/2019 ?Treatment break ?Cycle 8 FOLFIRI 01/06/2020 ?Cycle 9 FOLFIRI 01/26/2020 ?Cycle 10 FOLFIRI 02/24/2020 ?Cycle 11 FOLFIRI 03/16/2020 ?CT abdomen/pelvis 04/11/2020-posterior right liver lesion slightly larger, a second lesion in the inferior hepatic lobe is no longer present, portacaval nodes have decreased in size, slight decrease in size of left inguinal node, decreased L3 paraspinal mass ?CTs 09/25/2020-interval enlargement of a right liver mass, new 5 mm soft tissue nodule at the midline ventral peritoneum, no change in lytic L3 lesion, innumerable tiny lung nodules-likely inflammatory ?Cycle 1 FOLFIRI 10/05/2020 ?Cycle 2 FOLFIRI 10/19/2020 ?CTs 02/15/2021-enlargement of dominant right hepatic metastasis, enlargement of left inguinal node, stable density at the right paracolic gutter, resolution of omental nodule in the midline, new splenic infarct ?Cycle 1 FOLFIRI 02/21/2021 ?Cycle 2 FOLFIRI 03/15/2021 ?Cycle 3 FOLFIRI 05/31/2021 ?Cycle 4 FOLFIRI 06/21/2021, Udenyca ?Cycle 5 FOLFIRI 07/19/2021 ?CTs 10/02/2021-new lymph nodes in the chest and posterior mediastinum and in the left supraclavicular region as well as new and enlarging retroperitoneal and celiac lymph nodes.  Enlarging hepatic metastatic lesion  occupies nearly the entire right hepatic lobe.  Chronic splenic infarct.  New small right and trace left pleural effusions.  Small volume ascites. ?Encorafenib with Panitumumab beginning 10/11/2021, Encorafenib taking at 150 mg daily secondary to nausea; last received Panitumumab 11/08/2021; Encorafenib discontinued 01/03/2022 ?CTs 01/18/2022-mild increase in abdominal and left inguinal adenopathy, stable chest adenopathy, new healing nondisplaced left ninth rib fracture, increased ascites, stable lucency at L3, mild enlargement of right hepatic mass ?She declines further systemic therapy 02/14/2022 ?Enrolled in home hospice care ?  ?  ?3.  Ongoing tobacco and alcohol use ?  ?4.  Mild neutropenia related to chemotherapy 07/10/2017. Neulasta added. ?  ?5.  Admission January 2019 with an abdominal wall abscess and pubis osteomyelitis, status post debridement and antibiotics  ?  ?6.         Pain secondary to #1. ?7.         CT 06/16/2019 with nonocclusive thrombus infrarenal IVC.  Anticoagulation initiated. ?8.  Left patella fracture following a fall 08/20/2019 ?9.  Admission 04/11/2020 with a small bowel obstruction, exploratory laparotomy with repair of multiple serosal tears and small bowel resection 04/19/2020, closure of abdomen with application of wound VAC 04/21/2020, pathology from small bowel resection 04/19/2020-acute serositis ?  ?  ? ? ?Disposition: [name] has metastatic colon cancer.  She is now enrolled in home hospice care.  She has pain secondary to liver metastases and adenopathy.  She will continue MS Contin and oxycodone.  She will call if her pain is not relieved with the increased dose of MS Contin. ? ?She will continue follow-up with the home hospice team.  She would like to continue telephone visits with the Cancer center.  She will be scheduled for a telephone visit in 1 month. ? ? ?I discussed the assessment and treatment  plan with the patient. The patient was provided an opportunity to ask  questions and all were answered. The patient agreed with the plan and demonstrated an understanding of the instructions. ?  ?The patient was advised to call back or seek an in-person evaluation if the symptoms worsen or if

## 2022-04-18 NOTE — Progress Notes (Deleted)
?[place] ?OFFICE PROGRESS NOTE ? ? ?Diagnosis:  ? ?INTERVAL HISTORY:  ? ?*** ? ?Objective: ? ?Vital signs in last 24 hours: ? ?There were no vitals taken for this visit. ?  ? ?HEENT: *** ?Lymphatics: *** ?Resp: *** ?Cardio: *** ?GI: *** ?Vascular: *** ?Neuro:***  ?Skin:***  ? ?Portacath/PICC-without erythema ? ?Lab Results: ? ?Lab Results  ?Component Value Date  ? WBC 8.9 01/11/2022  ? HGB 12.1 01/11/2022  ? HCT 37.7 01/11/2022  ? MCV 96.4 01/11/2022  ? PLT 266 01/11/2022  ? NEUTROABS 6.3 01/11/2022  ? ? ?CMP  ?Lab Results  ?Component Value Date  ? NA 135 01/11/2022  ? K 3.7 01/11/2022  ? CL 103 01/11/2022  ? CO2 24 01/11/2022  ? GLUCOSE 103 (H) 01/11/2022  ? BUN 12 01/11/2022  ? CREATININE 0.43 (L) 01/11/2022  ? CALCIUM 8.4 (L) 01/11/2022  ? PROT 7.0 01/11/2022  ? ALBUMIN 2.8 (L) 01/11/2022  ? AST 28 01/11/2022  ? ALT 13 01/11/2022  ? ALKPHOS 370 (H) 01/11/2022  ? BILITOT 0.6 01/11/2022  ? GFRNONAA >60 01/11/2022  ? GFRAA >60 08/24/2020  ? ? ?Lab Results  ?Component Value Date  ? CEA1 1,042.94 (H) 10/11/2021  ? CEA 2,253.02 (H) 01/11/2022  ? ? ?Lab Results  ?Component Value Date  ? INR 0.9 04/12/2020  ? LABPROT 11.9 04/12/2020  ? ? ?Imaging: ? ?No results found. ? ?Medications: I have reviewed the patient's current medications. ? ? ?Assessment/Plan: ?Metastatic colon cancer presenting with an Abdominal/pelvic mass ?     Colon cancer diagnosed in [place] in 2016-pathology report dated 02/15/2015-invasive adenocarcinoma, well-differentiated with prominent mucinous features; microscopic tumor extension into mesenteric adipose tissue and focally involving visceral peritoneum; radial and mucosal margins negative; lymph-vascular invasion with suspicious foci identified;perineural invasion not identified; 16 negative lymph nodes; tumor deposits not identified; pT4apN0; MSI stable,BRAF V600E, ATM, MSS, TMB indeterminant-8, RAS wild-type ?  ?CT 04/22/2017 revealed a heterogenous anterior pelvic wall mass,  right inguinal mass, top normal sized retroperitoneal and iliac nodes, abutment of the anterior dome of the bladder and a loop of small bowel ?04/29/2017 biopsy of anterior abdominal/pelvic wall mass-acellular mucin dissecting fibrous stroma ?Cycle 1 FOLFOX 05/15/2017 ?Cycle 2 FOLFOX 05/28/2017 ?Cycle 3 FOLFOX with Avastin on 06/11/2017 ?Cycle 4 FOLFOX/Avastin 06/25/2017 ?Cycle 5 FOLFOX/Avastin 07/10/2017, Neulasta added ?Restaging CT 07/21/2017-mild decrease in the size of a right inguinal node and the anterior abdominal wall mass ?Radiation to the abdominal wall mass 08/18/2017-09/03/2017 ?Status post excision of abdominal wall mass 10/03/2017--pathology showed adenocarcinoma with excessive extracellular mucin. Margins negative for tumor. Right inguinal wall mass also excised showing metastatic adenocarcinoma with excessive extracellular mucin in 1 of 8 lymph nodes.  ?CT abdomen/pelvis 06/16/2019- new right hepatic lobe lesion measuring 2.7 x 1.7 cm. Hypoattenuation within the pancreatic uncinate process measuring 1.5 x 1.6 cm.  Mild left adrenal thickening.  Upper pole left renal mass 3.0 x 3.6 cm.  Infiltrative hypoenhancement involving the lower pole left kidney.  Nonocclusive thrombus within the infrarenal IVC.  Necrotic adenopathy within the small bowel mesentery, new.  New necrotic left inguinal nodes.  Lytic lesion within and adjacent the anterior side of the L3 vertebral body.  Contiguous mass within the left psoas measuring 4.6 x 3.4 cm. ?Cycle 1 FOLFIRI 06/23/2019 ?Cycle 2 FOLFIRI 07/08/2019, Udenyca added  ?Cycle 3 FOLFIRI 07/22/2019 ?Cycle 4 FOLFIRI 08/04/2019 ?Cycle 5 FOLFIRI 08/18/2019 ?CTs 09/08/2019- 3.4 x 2.9 cm metastasis in segment 7 liver, previously 2.7 x 1.7 cm.  14 mm lesion/implant  along the inferior right hepatic lobe, possibly new or possibly related to the prior peritoneal implant along the right paracolic gutter.  4 x 6 mm subpleural nodule right lower lobe not clearly evident on the prior study.   Near complete resolution of prior heterogeneous enhancement involving the bilateral kidneys which likely reflected pyelonephritis.  Near complete resolution of prior lymphadenopathy along with small bowel mesentery.  Mild left inguinal lymphadenopathy measuring up to 14 mm, previously 15 mm.  Lytic metastasis at L3 with associated soft tissue metastasis involving the left psoas muscle improved. ?Cycle 6 FOLFIRI 09/16/2019, Udenyca ?Cycle 7 FOLFIRI 09/30/2019 ?Treatment break ?Cycle 8 FOLFIRI 01/06/2020 ?Cycle 9 FOLFIRI 01/26/2020 ?Cycle 10 FOLFIRI 02/24/2020 ?Cycle 11 FOLFIRI 03/16/2020 ?CT abdomen/pelvis 04/11/2020-posterior right liver lesion slightly larger, a second lesion in the inferior hepatic lobe is no longer present, portacaval nodes have decreased in size, slight decrease in size of left inguinal node, decreased L3 paraspinal mass ?CTs 09/25/2020-interval enlargement of a right liver mass, new 5 mm soft tissue nodule at the midline ventral peritoneum, no change in lytic L3 lesion, innumerable tiny lung nodules-likely inflammatory ?Cycle 1 FOLFIRI 10/05/2020 ?Cycle 2 FOLFIRI 10/19/2020 ?CTs 02/15/2021-enlargement of dominant right hepatic metastasis, enlargement of left inguinal node, stable density at the right paracolic gutter, resolution of omental nodule in the midline, new splenic infarct ?Cycle 1 FOLFIRI 02/21/2021 ?Cycle 2 FOLFIRI 03/15/2021 ?Cycle 3 FOLFIRI 05/31/2021 ?Cycle 4 FOLFIRI 06/21/2021, Udenyca ?Cycle 5 FOLFIRI 07/19/2021 ?CTs 10/02/2021-new lymph nodes in the chest and posterior mediastinum and in the left supraclavicular region as well as new and enlarging retroperitoneal and celiac lymph nodes.  Enlarging hepatic metastatic lesion occupies nearly the entire right hepatic lobe.  Chronic splenic infarct.  New small right and trace left pleural effusions.  Small volume ascites. ?Encorafenib with Panitumumab beginning 10/11/2021, Encorafenib taking at 150 mg daily secondary to nausea; last received  Panitumumab 11/08/2021; Encorafenib discontinued 01/03/2022 ?CTs 01/18/2022-mild increase in abdominal and left inguinal adenopathy, stable chest adenopathy, new healing nondisplaced left ninth rib fracture, increased ascites, stable lucency at L3, mild enlargement of right hepatic mass ?She declines further systemic therapy 02/14/2022 ?  ?  ?3.  Ongoing tobacco and alcohol use ?  ?4.  Mild neutropenia related to chemotherapy 07/10/2017. Neulasta added. ?  ?5.  Admission January 2019 with an abdominal wall abscess and pubis osteomyelitis, status post debridement and antibiotics  ?  ?6.         Pain secondary to #1. ?7.         CT 06/16/2019 with nonocclusive thrombus infrarenal IVC.  Anticoagulation initiated. ?8.  Left patella fracture following a fall 08/20/2019 ?9.  Admission 04/11/2020 with a small bowel obstruction, exploratory laparotomy with repair of multiple serosal tears and small bowel resection 04/19/2020, closure of abdomen with application of wound VAC 04/21/2020, pathology from small bowel resection 04/19/2020-acute serositis ?  ?  ? ? ?Disposition: ?*** ? ?Gary Sherrill, MD ? ?04/18/2022  ?11:30 AM ? ? ?

## 2022-05-06 ENCOUNTER — Encounter: Payer: Self-pay | Admitting: Oncology

## 2022-05-09 ENCOUNTER — Encounter: Payer: Self-pay | Admitting: Oncology

## 2022-05-10 ENCOUNTER — Encounter: Payer: Self-pay | Admitting: *Deleted

## 2022-05-10 ENCOUNTER — Telehealth: Payer: Self-pay

## 2022-05-10 NOTE — Progress Notes (Signed)
Faxed message from [facility] that patient died in home on 05-24-22 at 9:31 pm. MD notified. ?

## 2022-05-10 NOTE — Telephone Encounter (Signed)
Mr Lopata called in and reported Yolanda Watson had passed away.  ?

## 2022-05-16 ENCOUNTER — Inpatient Hospital Stay: Payer: Medicare PPO | Admitting: Nurse Practitioner

## 2022-05-30 DEATH — deceased

## 2022-07-22 ENCOUNTER — Other Ambulatory Visit: Payer: Self-pay

## 2022-07-22 IMAGING — CT CT ABD-PELV W/ CM
2 of 5 series · 12 of 36 positions shown, 15 images · IV contrast (APPLIED)
Comparison: Multiple exams, including 09/25/2020

CLINICAL DATA: Colon cancer restaging.  Ongoing chemotherapy.

EXAM:
CT CHEST, ABDOMEN, AND PELVIS WITH CONTRAST
TECHNIQUE: Multidetector CT imaging of the chest, abdomen and pelvis was
performed following the standard protocol during bolus
administration of intravenous contrast.
CONTRAST:  100mL OMNIPAQUE IOHEXOL 300 MG/ML  SOLN

[Series 2: cap with · axial · 0.75mm/px · z∈[+1103,+1613]mm · 9 of 128 slices shown, 12 images]
[im 13/128  mediastinal]
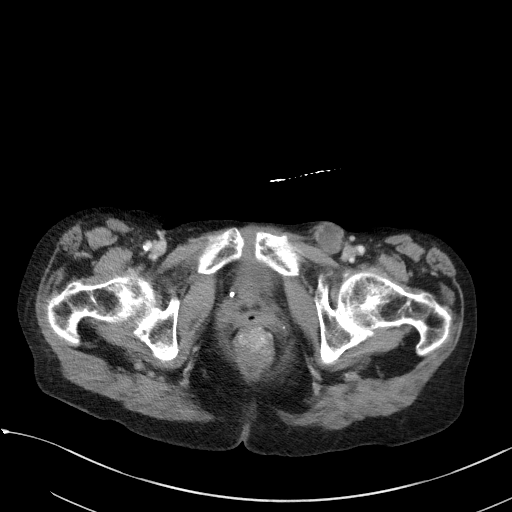
[im 13/128  lung]
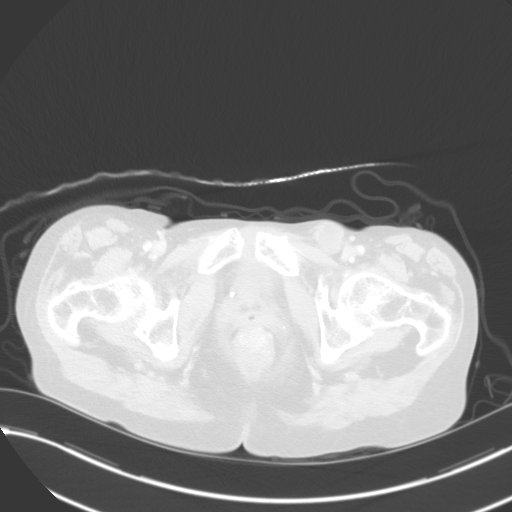
[im 26/128  lung]
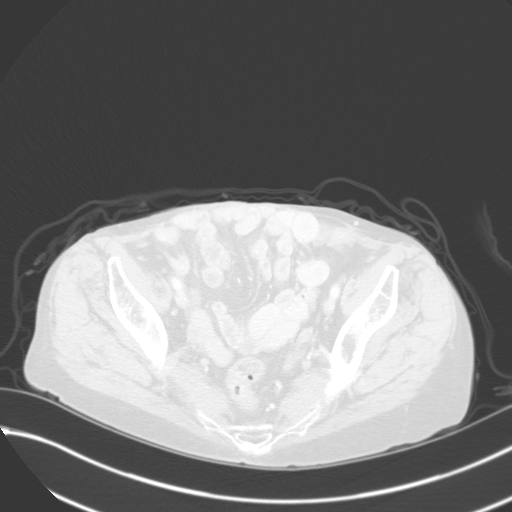
[im 39/128  lung]
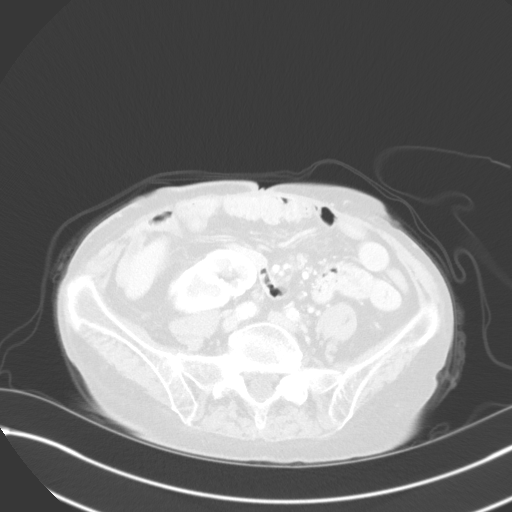
[im 51/128  lung]
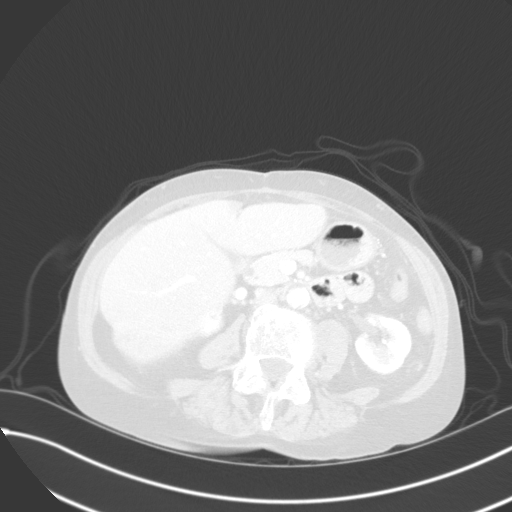
[im 64/128  mediastinal]
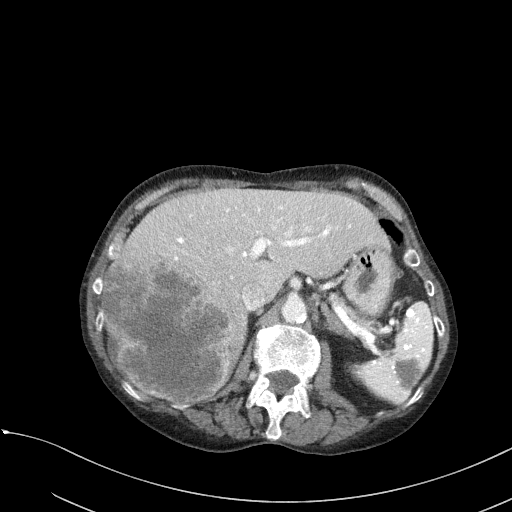
[im 64/128  lung]
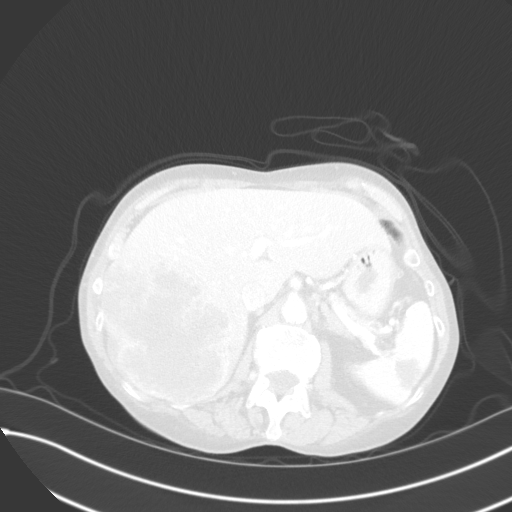
[im 77/128  lung]
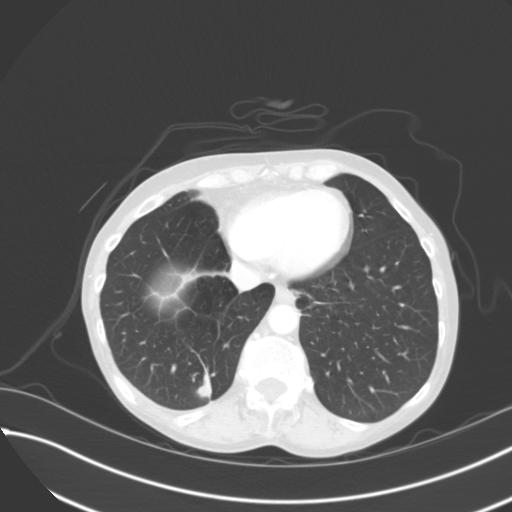
[im 89/128  lung]
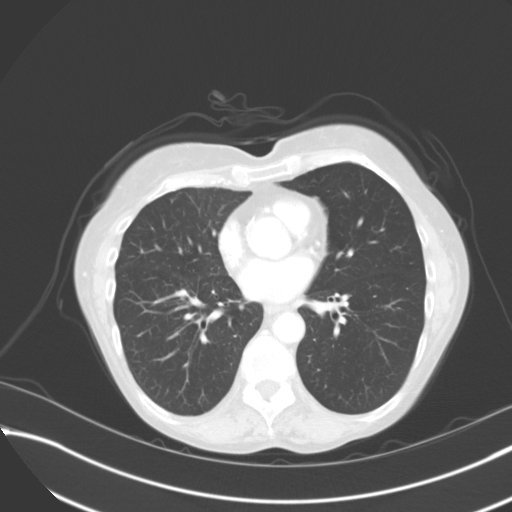
[im 102/128  lung]
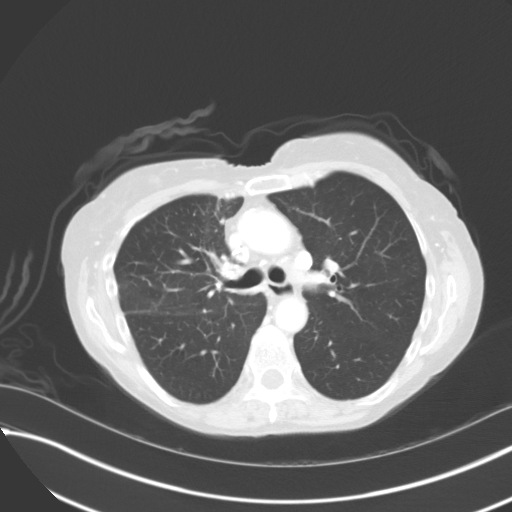
[im 115/128  mediastinal]
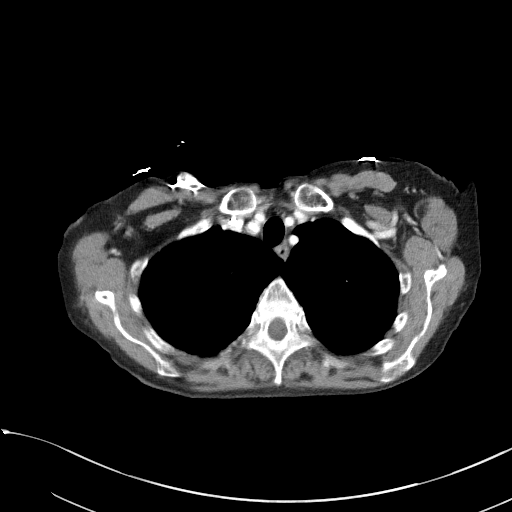
[im 115/128  lung]
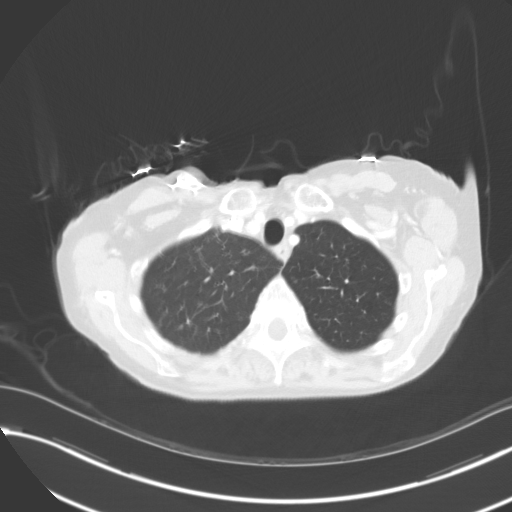

[Series 5: coronals · coronal · 0.82mm/px · 3 of 124 slices shown]
[im 25/124  lung]
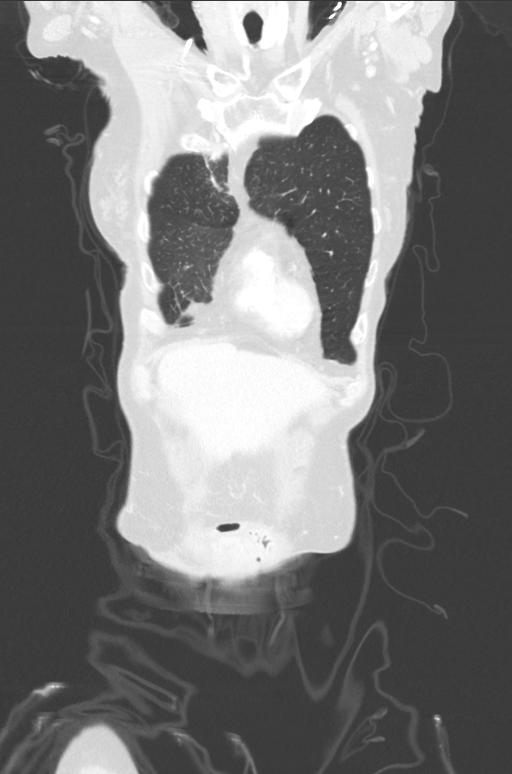
[im 50/124  lung]
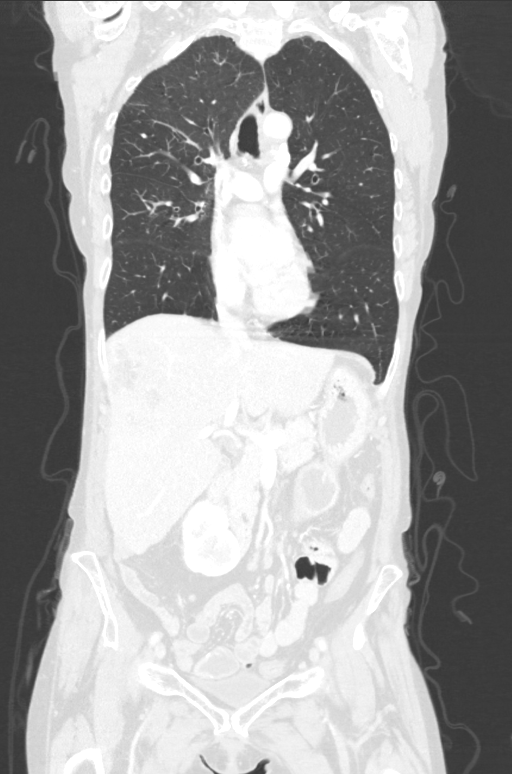
[im 74/124  lung]
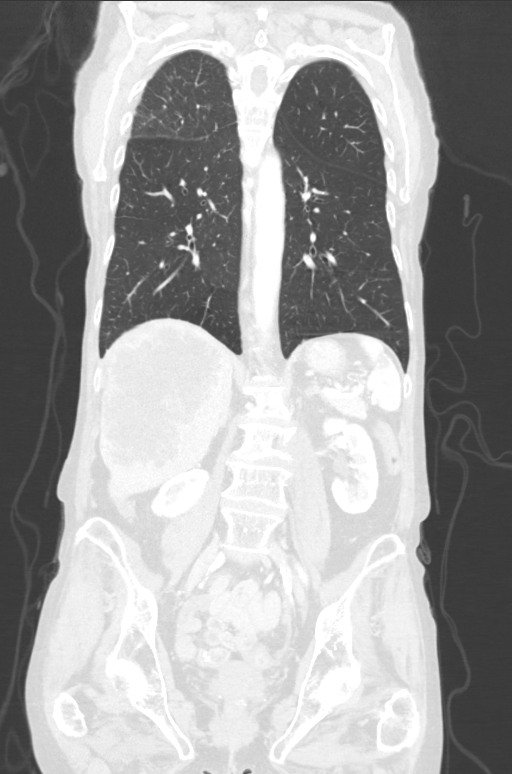

[12 of 36 positions shown; findings below may reference images not displayed]

FINDINGS: CT CHEST FINDINGS

Cardiovascular: Right Port-A-Cath tip: SVC. Coronary, aortic arch,
and branch vessel atherosclerotic vascular disease. Mitral valve
calcification noted.

Mediastinum/Nodes: Stable calcified mediastinal and right hilar
lymph nodes favoring old granulomatous disease.

Lungs/Pleura: Mild biapical pleuroparenchymal scarring. Suspected
centrilobular emphysema. Old granulomatous disease. Stable scarring
in the right lower lobe. Faint reticulonodular interstitial
accentuation in the right upper lobe peripherally, similar to prior
exams. Possible small vascular malformation in the right upper lobe.

Musculoskeletal: Unremarkable

CT ABDOMEN PELVIS FINDINGS

Hepatobiliary: Progressive enlargement of the dominant right hepatic
lobe metastatic lesion currently 12.2 by 9.4 by 12.4 cm (volume =
740 cm^3) on image 63 series 2, previously 9.1 by 6.4 by 8.5 cm
(volume = 260 cm^3) on 09/25/2020 by my measurements. Gallbladder
mildly contracted but otherwise unremarkable. Small hypodensity in
segment 4b of the liver along the falciform ligament on image 79
series 2, probably from focal steatosis.

Pancreas: Unremarkable

Spleen: There is a new wedge-shaped band of hypodensity in the
spleen measuring about 2.4 by 2.3 by 4.0 cm, with some retraction of
the capsule along this lesion, compatible with late subacute or
chronic infarct, but not present on 09/25/2020.

Punctate calcifications throughout the spleen compatible with old
granulomatous disease.

Adrenals/Urinary Tract: Both adrenal glands appear normal. Small
hypodense lesions in the kidneys are likely cysts but technically
too small to characterize. Scarring in the left kidney upper pole
appears stable with associated cortical thinning and hypoenhancement
in this region on the delayed images.

Stomach/Bowel: Right hemicolectomy.  No dilated bowel.

Vascular/Lymphatic: Aortoiliac atherosclerotic vascular disease.
Right gastric lymph node 0.8 cm in short axis on image 62 series 2,
previously 0.7 cm. Porta hepatis node 0.7 cm in short axis, image 74
series 2. Partially calcified aortocaval node 1.1 cm in short axis,
image 73 series 2, previously 0.8 cm.

Left inguinal hypodense lesion likely representing a lymph node
measures 2.0 cm in short axis on image 116 of series 2, previously
1.5 cm.

Reproductive: Left ovarian mass likely representing a dermoid
measuring 3.9 by 2.7 cm on image 108 of series 2, with fatty and
calcific elements in addition to soft tissue elements.

Other: Abnormal density with marginal enhancement along the right
paracolic gutter similar to prior, 5.7 by 1.3 by 1.5 cm on image 30
of series 6.

The omental nodule shown in the midline on the prior exam has
resolved.

Musculoskeletal: Healing fractures of the left L1 and L2 transverse
processes on images 61 and 68 of series 2 respectively, these appear
to be new compared to 09/25/2020, correlate with interval trauma.

Inferior endplate compression fractures at L2 and L3 are similar to
prior. There is associated lucency in the left side of the L3
vertebral body which is of indeterminate significance but unchanged.
IMPRESSION: 1. Progressive enlargement of the dominant right hepatic lobe
metastatic lesion, 740 cubic cm and previously 260 cubic cm on
09/25/2020. Roughly stable abnormal density with marginal
enhancement along the right paracolic gutter likely from peritoneal
malignancy.
2. Resolution of the omental nodule shown on the prior exam.
3. Interval enlargement of a left inguinal lymph node, suspicious
for malignancy.
4. New wedge-shaped band of hypodensity in the spleen, compatible
with late subacute or chronic infarct.
5. Healing fractures of the left L1 and L2 transverse processes,
these appear to be new compared to 09/25/2020, correlate with
interval trauma.
6. Other imaging findings of potential clinical significance:
Coronary atherosclerosis. Mitral valve calcification. Stable
scarring in the left kidney upper pole with associated cortical
thinning and hypoenhancement in this region on the delayed images.
Right hemicolectomy. Left ovarian dermoid. Inferior endplate
compression fractures at L2 and L3 are similar to prior.
7. Emphysema and aortic atherosclerosis.

Aortic Atherosclerosis (PQYQG-MHN.N) and Emphysema (PQYQG-DJP.X).
